# Patient Record
Sex: Male | Born: 1940 | Race: White | Hispanic: No | Marital: Married | Smoking: Former smoker
Health system: Southern US, Community
[De-identification: ages and names within clinical notes are randomized; demographics above are authoritative.]

## PROBLEM LIST (undated history)

## (undated) DIAGNOSIS — H3410 Central retinal artery occlusion, unspecified eye: Secondary | ICD-10-CM

## (undated) DIAGNOSIS — N400 Enlarged prostate without lower urinary tract symptoms: Secondary | ICD-10-CM

## (undated) DIAGNOSIS — I2510 Atherosclerotic heart disease of native coronary artery without angina pectoris: Secondary | ICD-10-CM

## (undated) DIAGNOSIS — I10 Essential (primary) hypertension: Secondary | ICD-10-CM

## (undated) DIAGNOSIS — Z923 Personal history of irradiation: Secondary | ICD-10-CM

## (undated) DIAGNOSIS — G4733 Obstructive sleep apnea (adult) (pediatric): Secondary | ICD-10-CM

## (undated) DIAGNOSIS — Z87442 Personal history of urinary calculi: Secondary | ICD-10-CM

## (undated) DIAGNOSIS — J302 Other seasonal allergic rhinitis: Secondary | ICD-10-CM

## (undated) DIAGNOSIS — K5790 Diverticulosis of intestine, part unspecified, without perforation or abscess without bleeding: Secondary | ICD-10-CM

## (undated) DIAGNOSIS — M1990 Unspecified osteoarthritis, unspecified site: Secondary | ICD-10-CM

## (undated) DIAGNOSIS — E119 Type 2 diabetes mellitus without complications: Secondary | ICD-10-CM

## (undated) DIAGNOSIS — C61 Malignant neoplasm of prostate: Secondary | ICD-10-CM

## (undated) DIAGNOSIS — Z973 Presence of spectacles and contact lenses: Secondary | ICD-10-CM

## (undated) DIAGNOSIS — E785 Hyperlipidemia, unspecified: Secondary | ICD-10-CM

## (undated) DIAGNOSIS — Z9989 Dependence on other enabling machines and devices: Secondary | ICD-10-CM

## (undated) DIAGNOSIS — C4490 Unspecified malignant neoplasm of skin, unspecified: Secondary | ICD-10-CM

## (undated) HISTORY — DX: Obstructive sleep apnea (adult) (pediatric): Z99.89

## (undated) HISTORY — DX: Obstructive sleep apnea (adult) (pediatric): G47.33

## (undated) HISTORY — PX: CARDIAC CATHETERIZATION: SHX172

## (undated) HISTORY — DX: Benign prostatic hyperplasia without lower urinary tract symptoms: N40.0

## (undated) HISTORY — PX: COLONOSCOPY W/ BIOPSIES AND POLYPECTOMY: SHX1376

## (undated) HISTORY — PX: PROSTATE BIOPSY: SHX241

## (undated) HISTORY — PX: TONSILLECTOMY: SUR1361

## (undated) HISTORY — DX: Central retinal artery occlusion, unspecified eye: H34.10

---

## 1961-05-22 HISTORY — PX: HYDROCELE EXCISION: SHX482

## 1999-06-29 ENCOUNTER — Ambulatory Visit (HOSPITAL_COMMUNITY): Admission: RE | Admit: 1999-06-29 | Discharge: 1999-06-29 | Payer: Self-pay | Admitting: *Deleted

## 2001-08-01 ENCOUNTER — Encounter: Payer: Self-pay | Admitting: Emergency Medicine

## 2001-08-01 ENCOUNTER — Emergency Department (HOSPITAL_COMMUNITY): Admission: AC | Admit: 2001-08-01 | Discharge: 2001-08-01 | Payer: Self-pay | Admitting: Emergency Medicine

## 2004-01-05 ENCOUNTER — Encounter: Admission: RE | Admit: 2004-01-05 | Discharge: 2004-01-05 | Payer: Self-pay | Admitting: Internal Medicine

## 2007-05-23 HISTORY — PX: BACK SURGERY: SHX140

## 2007-08-28 ENCOUNTER — Inpatient Hospital Stay (HOSPITAL_COMMUNITY): Admission: RE | Admit: 2007-08-28 | Discharge: 2007-08-30 | Payer: Self-pay | Admitting: Neurological Surgery

## 2007-09-30 ENCOUNTER — Encounter: Admission: RE | Admit: 2007-09-30 | Discharge: 2007-09-30 | Payer: Self-pay | Admitting: Neurological Surgery

## 2007-11-26 ENCOUNTER — Encounter: Admission: RE | Admit: 2007-11-26 | Discharge: 2007-11-26 | Payer: Self-pay | Admitting: Neurological Surgery

## 2008-02-24 ENCOUNTER — Encounter: Admission: RE | Admit: 2008-02-24 | Discharge: 2008-02-24 | Payer: Self-pay | Admitting: Neurological Surgery

## 2008-08-21 ENCOUNTER — Emergency Department (HOSPITAL_COMMUNITY): Admission: EM | Admit: 2008-08-21 | Discharge: 2008-08-21 | Payer: Self-pay | Admitting: Emergency Medicine

## 2008-08-25 ENCOUNTER — Inpatient Hospital Stay (HOSPITAL_COMMUNITY): Admission: EM | Admit: 2008-08-25 | Discharge: 2008-08-28 | Payer: Self-pay | Admitting: Emergency Medicine

## 2010-08-31 LAB — DIFFERENTIAL
Basophils Absolute: 0 10*3/uL (ref 0.0–0.1)
Basophils Absolute: 0 10*3/uL (ref 0.0–0.1)
Basophils Relative: 0 % (ref 0–1)
Basophils Relative: 0 % (ref 0–1)
Eosinophils Absolute: 0 10*3/uL (ref 0.0–0.7)
Eosinophils Absolute: 0 10*3/uL (ref 0.0–0.7)
Eosinophils Relative: 0 % (ref 0–5)
Eosinophils Relative: 0 % (ref 0–5)
Lymphocytes Relative: 10 % — ABNORMAL LOW (ref 12–46)
Lymphocytes Relative: 7 % — ABNORMAL LOW (ref 12–46)
Lymphs Abs: 0.6 10*3/uL — ABNORMAL LOW (ref 0.7–4.0)
Lymphs Abs: 0.8 10*3/uL (ref 0.7–4.0)
Monocytes Absolute: 0.6 10*3/uL (ref 0.1–1.0)
Monocytes Absolute: 0.8 10*3/uL (ref 0.1–1.0)
Monocytes Relative: 10 % (ref 3–12)
Monocytes Relative: 7 % (ref 3–12)
Neutro Abs: 6.5 10*3/uL (ref 1.7–7.7)
Neutro Abs: 7.6 10*3/uL (ref 1.7–7.7)
Neutrophils Relative %: 80 % — ABNORMAL HIGH (ref 43–77)
Neutrophils Relative %: 86 % — ABNORMAL HIGH (ref 43–77)

## 2010-08-31 LAB — HEPATIC FUNCTION PANEL
ALT: 16 U/L (ref 0–53)
ALT: 26 U/L (ref 0–53)
AST: 28 U/L (ref 0–37)
AST: 43 U/L — ABNORMAL HIGH (ref 0–37)
Albumin: 3.1 g/dL — ABNORMAL LOW (ref 3.5–5.2)
Albumin: 3.8 g/dL (ref 3.5–5.2)
Alkaline Phosphatase: 39 U/L (ref 39–117)
Alkaline Phosphatase: 59 U/L (ref 39–117)
Bilirubin, Direct: 0.2 mg/dL (ref 0.0–0.3)
Bilirubin, Direct: 0.3 mg/dL (ref 0.0–0.3)
Indirect Bilirubin: 0.9 mg/dL (ref 0.3–0.9)
Indirect Bilirubin: 1.2 mg/dL — ABNORMAL HIGH (ref 0.3–0.9)
Total Bilirubin: 1.1 mg/dL (ref 0.3–1.2)
Total Bilirubin: 1.5 mg/dL — ABNORMAL HIGH (ref 0.3–1.2)
Total Protein: 6.1 g/dL (ref 6.0–8.3)
Total Protein: 7.2 g/dL (ref 6.0–8.3)

## 2010-08-31 LAB — POCT I-STAT, CHEM 8
BUN: 25 mg/dL — ABNORMAL HIGH (ref 6–23)
BUN: 36 mg/dL — ABNORMAL HIGH (ref 6–23)
Calcium, Ion: 0.98 mmol/L — ABNORMAL LOW (ref 1.12–1.32)
Calcium, Ion: 1.04 mmol/L — ABNORMAL LOW (ref 1.12–1.32)
Chloride: 100 mEq/L (ref 96–112)
Chloride: 100 mEq/L (ref 96–112)
Creatinine, Ser: 1.4 mg/dL (ref 0.4–1.5)
Creatinine, Ser: 1.5 mg/dL (ref 0.4–1.5)
Glucose, Bld: 124 mg/dL — ABNORMAL HIGH (ref 70–99)
Glucose, Bld: 137 mg/dL — ABNORMAL HIGH (ref 70–99)
HCT: 49 % (ref 39.0–52.0)
HCT: 59 % — ABNORMAL HIGH (ref 39.0–52.0)
Hemoglobin: 16.7 g/dL (ref 13.0–17.0)
Hemoglobin: 20.1 g/dL — ABNORMAL HIGH (ref 13.0–17.0)
Potassium: 3 mEq/L — ABNORMAL LOW (ref 3.5–5.1)
Potassium: 3.3 mEq/L — ABNORMAL LOW (ref 3.5–5.1)
Sodium: 139 mEq/L (ref 135–145)
Sodium: 140 mEq/L (ref 135–145)
TCO2: 30 mmol/L (ref 0–100)
TCO2: 33 mmol/L (ref 0–100)

## 2010-08-31 LAB — BASIC METABOLIC PANEL
BUN: 12 mg/dL (ref 6–23)
BUN: 7 mg/dL (ref 6–23)
BUN: 7 mg/dL (ref 6–23)
CO2: 32 mEq/L (ref 19–32)
CO2: 32 mEq/L (ref 19–32)
CO2: 34 mEq/L — ABNORMAL HIGH (ref 19–32)
Calcium: 8.4 mg/dL (ref 8.4–10.5)
Calcium: 8.7 mg/dL (ref 8.4–10.5)
Calcium: 8.8 mg/dL (ref 8.4–10.5)
Chloride: 98 mEq/L (ref 96–112)
Chloride: 98 mEq/L (ref 96–112)
Chloride: 99 mEq/L (ref 96–112)
Creatinine, Ser: 0.89 mg/dL (ref 0.4–1.5)
Creatinine, Ser: 0.98 mg/dL (ref 0.4–1.5)
Creatinine, Ser: 1.09 mg/dL (ref 0.4–1.5)
GFR calc Af Amer: >60 mL/min (ref >60)
GFR calc Af Amer: >60 mL/min (ref >60)
GFR calc Af Amer: >60 mL/min (ref >60)
GFR calc non Af Amer: >60 mL/min (ref >60)
GFR calc non Af Amer: >60 mL/min (ref >60)
GFR calc non Af Amer: >60 mL/min (ref >60)
Glucose, Bld: 104 mg/dL — ABNORMAL HIGH (ref 70–99)
Glucose, Bld: 113 mg/dL — ABNORMAL HIGH (ref 70–99)
Glucose, Bld: 122 mg/dL — ABNORMAL HIGH (ref 70–99)
Potassium: 2.9 mEq/L — ABNORMAL LOW (ref 3.5–5.1)
Potassium: 3.2 mEq/L — ABNORMAL LOW (ref 3.5–5.1)
Potassium: 3.4 mEq/L — ABNORMAL LOW (ref 3.5–5.1)
Sodium: 137 mEq/L (ref 135–145)
Sodium: 138 mEq/L (ref 135–145)
Sodium: 140 mEq/L (ref 135–145)

## 2010-08-31 LAB — CBC
HCT: 43.3 % (ref 39.0–52.0)
HCT: 47.9 % (ref 39.0–52.0)
HCT: 55.3 % — ABNORMAL HIGH (ref 39.0–52.0)
Hemoglobin: 15.2 g/dL (ref 13.0–17.0)
Hemoglobin: 16.7 g/dL (ref 13.0–17.0)
Hemoglobin: 19 g/dL — ABNORMAL HIGH (ref 13.0–17.0)
MCHC: 34.4 g/dL (ref 30.0–36.0)
MCHC: 34.8 g/dL (ref 30.0–36.0)
MCHC: 35 g/dL (ref 30.0–36.0)
MCV: 91.8 fL (ref 78.0–100.0)
MCV: 92.9 fL (ref 78.0–100.0)
MCV: 93 fL (ref 78.0–100.0)
Platelets: 150 10*3/uL (ref 150–400)
Platelets: 162 10*3/uL (ref 150–400)
Platelets: 169 10*3/uL (ref 150–400)
RBC: 4.66 MIL/uL (ref 4.22–5.81)
RBC: 5.22 MIL/uL (ref 4.22–5.81)
RBC: 5.95 MIL/uL — ABNORMAL HIGH (ref 4.22–5.81)
RDW: 12.8 % (ref 11.5–15.5)
RDW: 12.9 % (ref 11.5–15.5)
RDW: 13.2 % (ref 11.5–15.5)
WBC: 6.6 10*3/uL (ref 4.0–10.5)
WBC: 8.2 10*3/uL (ref 4.0–10.5)
WBC: 8.9 10*3/uL (ref 4.0–10.5)

## 2010-08-31 LAB — GLUCOSE, CAPILLARY
Glucose-Capillary: 101 mg/dL — ABNORMAL HIGH (ref 70–99)
Glucose-Capillary: 102 mg/dL — ABNORMAL HIGH (ref 70–99)
Glucose-Capillary: 109 mg/dL — ABNORMAL HIGH (ref 70–99)
Glucose-Capillary: 111 mg/dL — ABNORMAL HIGH (ref 70–99)
Glucose-Capillary: 115 mg/dL — ABNORMAL HIGH (ref 70–99)
Glucose-Capillary: 115 mg/dL — ABNORMAL HIGH (ref 70–99)
Glucose-Capillary: 121 mg/dL — ABNORMAL HIGH (ref 70–99)
Glucose-Capillary: 123 mg/dL — ABNORMAL HIGH (ref 70–99)
Glucose-Capillary: 123 mg/dL — ABNORMAL HIGH (ref 70–99)
Glucose-Capillary: 128 mg/dL — ABNORMAL HIGH (ref 70–99)
Glucose-Capillary: 132 mg/dL — ABNORMAL HIGH (ref 70–99)
Glucose-Capillary: 132 mg/dL — ABNORMAL HIGH (ref 70–99)
Glucose-Capillary: 136 mg/dL — ABNORMAL HIGH (ref 70–99)
Glucose-Capillary: 141 mg/dL — ABNORMAL HIGH (ref 70–99)
Glucose-Capillary: 142 mg/dL — ABNORMAL HIGH (ref 70–99)
Glucose-Capillary: 144 mg/dL — ABNORMAL HIGH (ref 70–99)
Glucose-Capillary: 96 mg/dL (ref 70–99)
Glucose-Capillary: 96 mg/dL (ref 70–99)

## 2010-08-31 LAB — URINALYSIS, ROUTINE W REFLEX MICROSCOPIC
Glucose, UA: NEGATIVE mg/dL
Hgb urine dipstick: NEGATIVE
Ketones, ur: 40 mg/dL — AB
Leukocytes, UA: NEGATIVE
Nitrite: NEGATIVE
Protein, ur: 30 mg/dL — AB
Specific Gravity, Urine: 1.026 (ref 1.005–1.030)
Urobilinogen, UA: 1 mg/dL (ref 0.0–1.0)
pH: 7 (ref 5.0–8.0)

## 2010-08-31 LAB — LIPASE, BLOOD
Lipase: 18 U/L (ref 11–59)
Lipase: 30 U/L (ref 11–59)

## 2010-08-31 LAB — POCT URINALYSIS DIP (DEVICE)
Glucose, UA: NEGATIVE mg/dL
Hgb urine dipstick: NEGATIVE
Nitrite: NEGATIVE
Protein, ur: 100 mg/dL — AB
Specific Gravity, Urine: >=1.03 (ref 1.005–1.030)
Urobilinogen, UA: 0.2 mg/dL (ref 0.0–1.0)
pH: 5.5 (ref 5.0–8.0)

## 2010-08-31 LAB — LACTIC ACID, PLASMA: Lactic Acid, Venous: 0.8 mmol/L (ref 0.5–2.2)

## 2010-08-31 LAB — URINE MICROSCOPIC-ADD ON

## 2010-08-31 LAB — HEMOGLOBIN A1C
Hgb A1c MFr Bld: 5.8 % (ref 4.6–6.1)
Mean Plasma Glucose: 120 mg/dL

## 2010-10-04 NOTE — H&P (Signed)
NAMEASPYN, SCHRAG NO.:  [id number]   MEDICAL RECORD NO.:  [id number]          PATIENT TYPE:  INP   LOCATION:  5155                         FACILITY:  [facility]   PHYSICIAN:  Orson Ape. Weatherly, M.D.DATE OF BIRTH:  1941-01-09   DATE OF ADMISSION:  08/25/2008  DATE OF DISCHARGE:                              HISTORY & PHYSICAL   CHIEF COMPLAINTS:  Nausea and vomiting.   PRIMARY CARE PHYSICIAN:  Mark A. Perini, MD   HISTORY OF PRESENT ILLNESS:  The patient is a 70 year old male with a  history of diet-controlled type 2 diabetes, diverticulitis,  hypertension, and hyperlipidemia.  He presents with a 1-week history of  fatigue, nausea, vomiting, and diarrhea.  The illness started 7 days ago  and began with severe fatigue, then approximately 6 days ago, he  developed nausea, vomiting, and diarrhea.  The diarrhea subsided few  days later, but he can continued to have nausea and vomiting.  Also, had  a low-grade fever of up to 100.8 and abdominal distention.  He was seen  about 3 days ago at the [facility] and then sent over to the  emergency room, diagnosed with a viral illness and dehydration.  He was  given IV fluids in the emergency room and then sent home, but he came to  the ER again last night when his vomiting continued to persist.   REVIEW OF SYSTEMS:  Significant for last bowel movement 3 days ago and  he has had flatulence throughout this illness has been able to pass much  flatulence throughout this illness.   PAST MEDICAL HISTORY:  Diverticulitis.  His last flare was number of  years ago.  He also has hypertension, hyperlipidemia, and type 2  diabetes, which is diet controlled.   MEDICATIONS:  Lipitor; indapamide, this is a thiazide diuretic; and a  multivitamin.   PAST SURGICAL HISTORY:  1. L4-L5 laminectomy in April 2005, with fusion.  2. Hydrocele, which was repaired in 1962.   He has had no abdominal surgeries in the past.   SOCIAL  HISTORY:  He has a remote smoking history.  No alcohol.  Works in  Insurance underwriter.  Lives with his wife.   FAMILY HISTORY:  Noncontributory.   PHYSICAL EXAMINATION:  GENERAL:  The patient is in no acute distress.  He has just had an NG tube placed, sitting up in bed.  HEENT:  He is normocephalic and atraumatic.  His oropharynx is clear.  NG tube is in place.  At the time of exam, it is not draining any fluid.  NECK:  There is no lymphadenopathy.  CARDIOVASCULAR:  Regular rate and rhythm.  No murmurs.  PULMONARY:  Clear to auscultation bilaterally.  SKIN:  Normal.  There are no scars.  ABDOMEN:  Distended.  His abdomen is soft, it is not tender.  He does  have hyperactive bowel sounds in all quadrants except for the right  lower quadrant where he has normoactive bowel sounds.  There are some  high-pitched bowel sounds in the upper quadrants.  EXTREMITIES:  He has no edema.  He has  2+ DP pulses.   LABORATORIES AND STUDIES:  Sodium 139, potassium was low at 3.0,  chloride 100, BUN 25, creatinine 1.4, and glucose 124.  UA showed no  signs of infection.  Normal specific gravity.  White blood cells 8.2  with 80% neutrophils, hemoglobin 16.7, hematocrit 47.9, and platelets  169.  Lactic acid 0.8.  Albumin was slightly low at 3.1, but otherwise  his LFTs were within normal limits.  His lipase is normal at 30.  CT  abdomen and pelvis showed moderate to high-grade small bowel  obstruction.  Questions, transition point in the small bowel, which may  be the point of obstruction.  This is in the mid ileum, could be benign  or malignant stricture.  Also, he had sigmoid diverticulosis.  Chest x-  ray showed bibasilar atelectasis, no free air, also diffuse gaseous  small bowel dilation suspicious for SBO, which was able to be seen in  the chest x-ray.   ASSESSMENT AND PLAN:  A 70 year old male with small bowel obstruction.  1. Small bowel obstruction without a history of prior abdominal      surgery.   Viral illness is the most likely precipitate.  Other      possibilities include a malignancy or other mass causing a      stricture or small bowel diverticuli.   We will continue NG tube to suction, place the patient n.p.o., IV fluids  with potassium, and encourage ambulation, and abdominal films in the  morning.  After the small bowel obstruction resolves, we probably need  further imaging to evaluate the stricture seen on CT.  1. Diabetes.  The patient is diet controlled at home.  We will check      an hemoglobin A1c.  Place him on sensitive sliding scale insulin.  2. Hyperlipidemia.  We will hold his Lipitor for now while he is      n.p.o.  3. Hypertension.  We will hold his diuretic for now while he is n.p.o.      We will monitor his blood pressures regularly and consider IV      antihypertensives if his pressures are not controlled.  4. Prophylaxis.  We will give Lovenox sequential compression devices.  5. Hypokalemia.  We will replete his potassium.      Carin Hock, MD  Electronically Signed      Orson Ape. Rise Patience, M.D.  Electronically Signed    SO/MEDQ  D:  08/25/2008  T:  08/26/2008  Job:  [id number]   cc:   Elta Guadeloupe A. Perini, M.D.

## 2010-10-04 NOTE — Discharge Summary (Signed)
NAMEKEDUS, DROUHARD NO.:  [id number]   MEDICAL RECORD NO.:  [id number]          PATIENT TYPE:  INP   LOCATION:  5155                         FACILITY:  [facility]   PHYSICIAN:  Orson Ape. Weatherly, M.D.DATE OF BIRTH:  [birth year]   DATE OF ADMISSION:  08/25/2008  DATE OF DISCHARGE:  08/28/2008                               DISCHARGE SUMMARY   REASON FOR HOSPITALIZATION:  Small bowel obstruction.   DISCHARGE DIAGNOSIS:  Small bowel obstruction.   ADDITIONAL DIAGNOSES:  1. Hypertension.  2. Hyperlipidemia.  3. Diverticulosis.  4. Diet-controlled type 2 diabetes.   DISCHARGE MEDICATIONS:  Lipitor continued previous dose, indapamide,  multivitamin, MiraLax 17 grams dissolved in 8 ounces of liquid p.o.  daily.   BRIEF HISTORY:  The patient is a 70 year old with no history of  abdominal surgeries who was admitted for several days of nausea and  vomiting, abdominal distention, found to have a small bowel obstruction.   HOSPITAL COURSE BY PROBLEM:  1. Small bowel obstruction.  CT showed moderate to high-grade small      bowel obstruction.  There was noted a possible transition point in      the small bowel which could be this point of obstruction in the mid      ileum.  This may be a stricture or some other mass.  Also noted on      CT with sigmoid diverticulosis.  During his hospitalization, the      patient was initially placed n.p.o.  An NG tube was placed and put      on suction.  The patient was encouraged to ambulate.  He was given      one dose of milk of magnesia.  As his small bowel obstruction      improved, his diet was advanced to clear liquid.  The patient's      condition did improve.  X-rays were taken following oral contrast      several hours apart and showed progression of the contrast through      the small and large bowel and this continued to improve.  He is      being sent home on a full liquid diet and will follow up with Dr.      Rise Patience  on August 31, 2008.  He is also being sent home on daily      MiraLax.  2. In terms of his other problems, he was placed on sensitive sliding-      scale insulin.  The hemoglobin A1c was measured which turned out to      be 5.8.  3. For his hyperlipidemia, we did not continue him on his Lipitor.  He      was n.p.o.  4. For his hypertension, we held his diuretics.  His blood pressures      remained within a reasonable range.   FOLLOWUP ISSUES:  Followup of the patient's small bowel obstruction and  perhaps further imaging as needed to better describe the lesion in his  small intestine seen on the initial CT and advancing his diet as  tolerated.  FOLLOWUP APPOINTMENTS:  As mentioned previously, the patient is to  follow up with Dr. Rise Patience on August 31, 2008 at 10:30 and to follow up  with his primary care doctor, Dr. Joylene Draft as needed.      Carin Hock, MD  Electronically Signed      Orson Ape. Rise Patience, M.D.  Electronically Signed    SO/MEDQ  D:  08/28/2008  T:  08/28/2008  Job:  [id number]   cc:   Elta Guadeloupe A. Perini, M.D.

## 2010-10-04 NOTE — Op Note (Signed)
Jeremiah [name], Jeremiah [name] NO.:  [id number]   MEDICAL RECORD NO.:  [id number]          PATIENT TYPE:  INP   LOCATION:  2899                         FACILITY:  [facility]   PHYSICIAN:  [name], MD     DATE OF BIRTH:  19-Oct-1940   DATE OF PROCEDURE:  08/28/2007  DATE OF DISCHARGE:                               OPERATIVE REPORT   PREOPERATIVE DIAGNOSIS:  1. Spondylolisthesis, L4-L5.  2. Spinal stenosis, L4-L5.  3. Facet arthropathy, L4-L5.  4. Back pain.  5. Left leg pain.   POSTOPERATIVE DIAGNOSIS:  1. Spondylolisthesis, L4-L5.  2. Spinal stenosis, L4-L5.  3. Facet arthropathy, L4-L5.  4. Back pain.  5. Left leg pain.   PROCEDURE:  1. Decompressive lumbar laminectomy, hemifacetectomy and foraminotomy,      L4-L5 for decompression of the L4 and the L5 nerve roots requiring      more work than is required with a typical PLIF procedure,      considerable time was spent undercutting the lamina of L3 to      decompress the L4 nerve roots up to the disc space of L3-L4 such      that I actually decompressed L3-L4 also as a level separate from      the decompression required of the PLIF procedure at L4-L5.  2. Posterior lumbar interbody fusion L4-L5 utilizing 12 x 22 mm PEEK      interbody cage packed with local autograft Actifuse putty and a 12      x 22 mm Tangent interbody bone wedge, the midline was packed with      local autograft and Actifuse.  3. Intertransverse arthrodesis L4-L5 utilizing locally harvested      morselized autologous bone graft and Actifuse putty.  4. Nonsegmental fixation L4-L5 utilizing the Legacy pedicle screw      system.   SURGEON:  [name], M.D.   ASSISTANT:  Kary Kos, M.D.   ANESTHESIA:  General endotracheal anesthesia.   COMPLICATIONS:  None apparent.   INDICATIONS FOR PROCEDURE:  [name] is a very pleasant 70 year old  gentleman who presented with a degenerative spondylolisthesis at L4-L5  with severe facet  arthropathy and spinal stenosis.  He had back and left  leg pain.  He tried medical management including epidural steroid  injections for quite sometime without significant relief.  His pain  became unrelenting and quite severe, affecting his quality of life.  I  recommended decompression and instrumented fusion to address both the  segmental instability and spinal stenosis.  He understood the risks,  benefits, expected outcome, and wished to proceed.   DESCRIPTION OF PROCEDURE:  The patient was taken to the operating room  and after induction of adequate generalized endotracheal anesthesia, he  was rolled in the prone position on chest rolls and all pressure points  were padded.  His lumbar region was prepped with DuraPrep and draped in  the usual sterile fashion.  10 mL of local anesthesia was injected and a  dorsal midline incision was made and carried down to the lumbosacral  fascia.  The fascia was  opened and the paraspinous musculature was taken  down in a subperiosteal fashion to expose L3-L4 and L4-L5.  Intraoperative fluoroscopy confirmed my level.  I then used the Leksell  rongeur to remove the spinous process and begin the laminectomy process  at L4-L5. The laminectomy, hemifacetectomy and foraminotomies were  completed with the 3 and 4 mm Kerrison punch. As the underlying yellow  ligament was opened and removed, it was quite compressive over the L4  and L5 nerve roots.  The L4 nerve root seemed to be more compressed than  the L5 nerve roots.  I spent considerable time undercutting the lamina  of L3, removing yellow ligament to decompress the L3-L4 level to help  prevent adjacent level problems.  I also followed this along the L4  nerve roots to decompress these distally into their respective foramina.  Once the decompression was completed at the L4 and L5 nerve roots, I  turned my attention to the PLIF procedure.  We incised the disc space  and used sequential distraction and  distracted the disc space up to 12  mm.  We then used the rotating cutter and 12 mm cutting chisel to  prepare the endplates.  We prepared the midline with the Epstein  curette.  We then used a 12 x 22 mm PEEK interbody cage on the patient's  left side and a 12 x 22 mm Tangent interbody bone wedge on the patient's  right side and the midline was packed with local autograft and Actifuse  putty.  We then localized the pedicle screw entry zones utilizing  surface landmarks with lateral fluoroscopy.  We probed each pedicle with  a pedicle probe, tapped each pedicle with a 5.5 tap, probed each pedicle  with a ball probe to assure no break in the cortex, and placed 6.5 x 45  mm pedicle screws into the pedicles of L4 on the left and L5  bilaterally.  I used a 6.5 by 50 mm pedicle screw in the pedicle of L4  on the right because of the overgrown facet above.  We then decorticated  the transverse processes and placed a mixture of local autograft and  Actifuse out over these to perform intertransverse arthrodesis  bilaterally.  We then placed 30 mm lordotic rods into the multiaxial  screw heads of the pedicle screws and locked these into position after  achieving compression of the grafts.  We then irrigated with saline  solution containing bacitracin, checked our construct using AP and  lateral fluoroscopy, placed a medium Hemovac drain through a separate  stab incision after palpating along the nerve roots with a coronary  dilator to assure adequate decompression, the dura was lined with  Gelfoam.  We then closed the muscle and the fascia with 0 Vicryl, closed  the subcutaneous and subcuticular tissue with 2-0 and 3-0 Vicryl, and  closed the skin with Benzoin and Steri-Strips.  The drapes were removed,  a sterile dressing was applied.  The patient was awakened from general  anesthesia and transferred to the recovery room in stable condition.  At  the end of the procedure, all sponge, needle and  instrument counts were  correct.      [name], MD  Electronically Signed     DSJ/MEDQ  D:  08/28/2007  T:  08/28/2007  Job:  [id number]

## 2011-02-14 LAB — BASIC METABOLIC PANEL
BUN: 27 — ABNORMAL HIGH
CO2: 30
Calcium: 10.3
Chloride: 99
Creatinine, Ser: 0.92
GFR calc Af Amer: >60
GFR calc non Af Amer: >60
Glucose, Bld: 95
Potassium: 4.4
Sodium: 140

## 2011-02-14 LAB — CBC
HCT: 49.6
Hemoglobin: 17.1 — ABNORMAL HIGH
MCHC: 34.5
MCV: 92.1
Platelets: 188
RBC: 5.38
RDW: 12.5
WBC: 6.3

## 2011-02-14 LAB — DIFFERENTIAL
Basophils Absolute: 0
Basophils Relative: 1
Eosinophils Absolute: 0.1
Eosinophils Relative: 2
Lymphocytes Relative: 22
Lymphs Abs: 1.4
Monocytes Absolute: 0.6
Monocytes Relative: 9
Neutro Abs: 4.2
Neutrophils Relative %: 66

## 2011-02-14 LAB — TYPE AND SCREEN
ABO/RH(D): A POS
Antibody Screen: NEGATIVE

## 2011-02-14 LAB — ABO/RH: ABO/RH(D): A POS

## 2011-02-14 LAB — APTT: aPTT: 25

## 2011-02-14 LAB — PROTIME-INR
INR: 0.9
Prothrombin Time: 12.3

## 2012-05-24 ENCOUNTER — Ambulatory Visit (INDEPENDENT_AMBULATORY_CARE_PROVIDER_SITE_OTHER): Payer: Medicare Other | Admitting: *Deleted

## 2012-05-24 DIAGNOSIS — H3410 Central retinal artery occlusion, unspecified eye: Secondary | ICD-10-CM

## 2012-06-14 ENCOUNTER — Other Ambulatory Visit: Payer: Self-pay | Admitting: Internal Medicine

## 2012-06-14 DIAGNOSIS — H3410 Central retinal artery occlusion, unspecified eye: Secondary | ICD-10-CM

## 2012-06-16 ENCOUNTER — Ambulatory Visit
Admission: RE | Admit: 2012-06-16 | Discharge: 2012-06-16 | Disposition: A | Discharge status: Home / Self Care | Payer: Medicare Other | Source: Ambulatory Visit | Attending: Internal Medicine | Admitting: Internal Medicine

## 2012-06-16 DIAGNOSIS — H3410 Central retinal artery occlusion, unspecified eye: Secondary | ICD-10-CM

## 2013-05-25 ENCOUNTER — Encounter: Payer: Self-pay | Admitting: Radiation Oncology

## 2014-01-20 DIAGNOSIS — C61 Malignant neoplasm of prostate: Secondary | ICD-10-CM

## 2014-01-20 HISTORY — DX: Malignant neoplasm of prostate: C61

## 2014-03-05 ENCOUNTER — Encounter: Payer: Self-pay | Admitting: Radiation Oncology

## 2014-03-09 ENCOUNTER — Encounter: Payer: Self-pay | Admitting: Radiation Oncology

## 2014-03-09 NOTE — Progress Notes (Addendum)
GU Location of Tumor / Histology: prostate adenocarcinoma  If Prostate Cancer, Gleason Score is (4 +3 ) and PSA is (9.72 on 12/09/13) 06/13/13 PSA 8.33 12/16/12 PSA 7.63  Jeremiah [name] presented 2 1/2 years ago with signs/symptoms of: rising PSA  Biopsies of  Prostate (if applicable) revealed:  [id number]   Past/Anticipated interventions by urology, if any: biopsies and surveillance since 2012  Past/Anticipated interventions by medical oncology, if any: no  Weight changes, if any: no  Bowel/Bladder complaints, if any:  IPSS 10, weak stream, nocturia x 2  Nausea/Vomiting, if any: no  Pain issues, if any:  no  SAFETY ISSUES:  Prior radiation? no  Pacemaker/ICD? no  Possible current pregnancy? na  Is the patient on methotrexate? no  Current Complaints / other details:  [employer], retired, married, 2 children Positive prostate biopsies in 2013, 2014, Gleason 6 Dr Risa Grill: Discussion re: external beam radiation; seed implant is potential possibility although prostate size is borderline.

## 2014-03-10 ENCOUNTER — Encounter: Payer: Self-pay | Admitting: Radiation Oncology

## 2014-03-10 ENCOUNTER — Ambulatory Visit
Admission: RE | Admit: 2014-03-10 | Discharge: 2014-03-10 | Disposition: A | Discharge status: Home / Self Care | Payer: Medicare Other | Source: Ambulatory Visit | Attending: Radiation Oncology | Admitting: Radiation Oncology

## 2014-03-10 VITALS — BP 126/75 | HR 66 | Temp 98.6°F | Resp 20 | Ht 69.0 in | Wt 187.1 lb

## 2014-03-10 DIAGNOSIS — C61 Malignant neoplasm of prostate: Secondary | ICD-10-CM | POA: Insufficient documentation

## 2014-03-10 DIAGNOSIS — Z510 Encounter for antineoplastic radiation therapy: Secondary | ICD-10-CM | POA: Diagnosis not present

## 2014-03-10 HISTORY — DX: Hyperlipidemia, unspecified: E78.5

## 2014-03-10 HISTORY — DX: Unspecified malignant neoplasm of skin, unspecified: C44.90

## 2014-03-10 HISTORY — DX: Essential (primary) hypertension: I10

## 2014-03-10 HISTORY — DX: Unspecified osteoarthritis, unspecified site: M19.90

## 2014-03-10 HISTORY — DX: Type 2 diabetes mellitus without complications: E11.9

## 2014-03-10 HISTORY — DX: Diverticulosis of intestine, part unspecified, without perforation or abscess without bleeding: K57.90

## 2014-03-10 HISTORY — DX: Malignant neoplasm of prostate: C61

## 2014-03-10 NOTE — Progress Notes (Signed)
Please see the Nurse Progress Note in the MD Initial Consult Encounter for this patient. 

## 2014-03-10 NOTE — Progress Notes (Signed)
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 male who is seen today through the courtesy of Dr. Risa Grill for discussion of possible radiation therapy in the management of his stage T1c intermediate risk adenocarcinoma prostate. He initially presented with an elevated PSA of 6.5 and was referred to Dr. Risa Grill. A repeat PSA at [facility] was 5.8. He underwent ultrasound-guided biopsies on June 08, 2011  and was found to have low volume Gleason 6 in 2 of 12 biopsies. He appropriately elected active surveillance. His PSA rose to 7.0 by October 2013 and underwent repeat biopsies on 12/05/2011 revealing again low volume Gleason 6 in  3 of 12 biopsies. His most recent PSA rose to 9.72 by 12/09/2013. He underwent ultrasound-guided biopsies on 02/09/2014 and was found have Gleason 7 (4+3) involving 5% of one core from the left lateral mid gland, pattern 4 equals 70%. He also had Gleason 7 (3+4) involving 5% of one core from left lateral apex and 40% of one core from the left apex, pattern 4 equals 30%. His gland volume was 53 cc. He is doing reasonably well from a GU and GI standpoint. His I PSS score is 10 while on tamsulosin. He does have erectile dysfunction and is not sexually active.  PREVIOUS RADIATION THERAPY: No   PAST MEDICAL HISTORY:  has a past medical history of Prostate cancer (01/2014); Diverticulosis; Diabetes mellitus without complication; Hyperlipidemia; Hypertension; Osteoarthritis; and Skin cancer.     PAST SURGICAL HISTORY:  Past Surgical History  Procedure Laterality Date  . Prostate biopsy  2013, 2014, 01/2014    Gleason 7  . Back  surgery  2009  . Tonsillectomy    . Hydrocele excision  1963     FAMILY HISTORY: family history includes Cancer in his father and paternal uncle; Heart attack in his father. Is father died of a heart attack at 46, also had a history of prostate cancer. His mother died following a leg fracture at age 48. Two paternal uncles had prostate cancer in their late years.   SOCIAL HISTORY:  reports that he quit smoking about 53 years ago. He does not have any smokeless tobacco history on file. He reports that he does not drink alcohol or use illicit drugs. Married, 2 children, retired Charity fundraiser.   ALLERGIES: Review of patient's allergies indicates no known allergies.   MEDICATIONS:  Current Outpatient Prescriptions  Medication Sig Dispense Refill  . aspirin 325 MG EC tablet Take 325 mg by mouth daily.      [name] Kitchen atorvastatin (LIPITOR) 10 MG tablet Take 10 mg by mouth daily.      . calcium-vitamin D (OSCAL WITH D) 250-125 MG-UNIT per tablet Take 1 tablet by mouth daily.      . indapamide (LOZOL) 2.5 MG tablet Take 2.5 mg by mouth daily.      . potassium chloride (KLOR-CON) 20 MEQ packet Take by mouth 2 (two) times daily.      . tamsulosin (FLOMAX) 0.4 MG CAPS capsule Take 0.4 mg by mouth.      . Vitamin D, Ergocalciferol, (DRISDOL) 50000 UNITS  CAPS capsule Take 50,000 Units by mouth every 7 (seven) days.       No current facility-administered medications for this encounter.     REVIEW OF SYSTEMS:  Pertinent items are noted in HPI.    PHYSICAL EXAM:  height is 5\' 9"  (1.753 m) and weight is 187 lb 1.6 oz (84.868 kg). His oral temperature is 98.6 F (37 C). His blood pressure is 126/75 and his pulse is 66. His respiration is 20.   Alert and oriented 73 year old white male appearing slightly younger than his stated age. Head and neck examination: Grossly unremarkable. Nodes: Without palpable cervical or supraclavicular lymphadenopathy. Chest: Lungs clear. Abdomen: Without masses  organomegaly. Genitalia: Unremarkable to inspection. Rectal: The prostate gland is slightly enlarged and is without focal induration or nodularity. Extremities: Without edema.   LABORATORY DATA:  Lab Results  Component Value Date   WBC 6.6 08/26/2008   HGB 15.2 08/26/2008   HCT 43.3 08/26/2008   MCV 93.0 08/26/2008   PLT 150 08/26/2008   Lab Results  Component Value Date   NA 138 08/28/2008   K 3.4* 08/28/2008   CL 98 08/28/2008   CO2 32 08/28/2008   Lab Results  Component Value Date   ALT 16 08/25/2008   AST 28 08/25/2008   ALKPHOS 39 08/25/2008   BILITOT 1.1 08/25/2008   PSA 9.72 from 12/09/2013   IMPRESSION: StageT1c intermediate risk adenocarcinoma prostate. I explained to the patient and his wife that his prognosis is related to his stage, PSA level, and Gleason score. His stage and PSA level are favorable while his Gleason score of 7 is of intermediate favorability. His PSA does approach criteria for intermediate risk (>10), some of which may be from his slightly enlarged prostate. I would not consider him to be early intermediate risk. We discussed surgery versus continued active surveillance versus radiation therapy. Radiation therapy options include seed implantation with or without 5 weeks of external beam radiation therapy versus 8 weeks of external beam/IMRT. I feel somewhat uncomfortable about treating him with seed implantation alone in view of his Gleason 7 (4+3), and also PSA approaching 10 which is borderline high-risk even though he does have low volume disease.  His prostate volume may be contributing to some elevation of his PSA. If he wants seed implantation then I would prefer 5 weeks of external beam followed by a seed implant boost. His prostate volume is probably acceptable for seed implantation, but we would obviously need to perform a CT arch study to confirm.  We discussed both seed implantation and also external beam/IMRT in great detail. He will think things over and get back in touch  with me if he wishes to proceed with radiation therapy. He knows that we would need to have Dr. Risa Grill placed 3 gold seed markers for image guidance regardless of his choice of therapy.  PLAN:  as discussed above.   I spent 60 minutes minutes face to face with the patient and more than 50% of that time was spent in counseling and/or coordination of care.

## 2014-03-11 ENCOUNTER — Encounter: Payer: Self-pay | Admitting: Radiation Oncology

## 2014-03-11 ENCOUNTER — Telehealth: Payer: Self-pay | Admitting: *Deleted

## 2014-03-11 NOTE — Progress Notes (Signed)
CC: Dr. Rana Snare  Jeremiah [name] left a message that he wants to go with EB/IMRT. I will get him scheduled for placement of gold seeds, then CT sim.

## 2014-03-11 NOTE — Telephone Encounter (Signed)
Received call from patient stating he has decided to have gold seed markers placed and then receive radiation treatment. Informed him Verdell Carmine, Art therapist will call him after this is scheduled with Dr Cy Blamer office. Pt verbalized understanding. Dr Valere Dross notified per in basket so orders can be placed.

## 2014-03-13 ENCOUNTER — Telehealth: Payer: Self-pay | Admitting: *Deleted

## 2014-03-13 NOTE — Telephone Encounter (Signed)
CALLED PATIENT TO INFORM OF GOLD SEED PLACEMENT ON 04-09-14 @ 9:15 AM @ DR. GRAPEY'S OFFICE AND HIS SIM ON 04-13-14 @ 9 AM @ DR. MURRAY'S OFFICE, SPOKE WITH BRENDA Graw AND SHE IS AWARE OF THESE APPTS.

## 2014-04-09 ENCOUNTER — Encounter: Payer: Self-pay | Admitting: Radiation Oncology

## 2014-04-09 NOTE — Progress Notes (Signed)
CC: Dr. Rana Snare  There was miscommunication in Mr. Denno choice of XRT. Dr. Risa Grill clarified that he wants 5 weeks of external beam followed by seed inplant boost instead of 8 weeks of IMRT alone. We will proceed with CT sim as planned for 11-23 and do CT arch study at same time. His gold markers are being placed today.

## 2014-04-13 ENCOUNTER — Ambulatory Visit
Admission: RE | Admit: 2014-04-13 | Discharge: 2014-04-13 | Disposition: A | Discharge status: Home / Self Care | Payer: Medicare Other | Source: Ambulatory Visit | Attending: Radiation Oncology | Admitting: Radiation Oncology

## 2014-04-13 DIAGNOSIS — Z510 Encounter for antineoplastic radiation therapy: Secondary | ICD-10-CM | POA: Diagnosis not present

## 2014-04-13 DIAGNOSIS — C61 Malignant neoplasm of prostate: Secondary | ICD-10-CM

## 2014-04-13 NOTE — Progress Notes (Signed)
CC: Dr. Rana Snare   Complex simulation/treatment planning note: The patient was taken to the CT simulator.  A Vac lock immobilization device was constructed.  A red rubber tube was placed within the rectal vault.  He was then catheterized and contrast instilled into the bladder and urethra.  The CT data set was sent to the planning system where I contoured his prostate.  The prostate volume was 51.7 mL, correlating well with Dr. Risa Grill is measurement of 53 mL.  This volume did not include the proximal seminal vesicles.  The prostate volume was projected over the pubic arch with a 10 angle.  The arch was determined to be open.  Therefore, the patient is a candidate for a prostate seed implant boost after his initial external beam.  The CT data set was sent to the MIM planning system where I again contoured his prostate, seminal vesicles, rectum, and bladder.  The prostate and proximal seminal vesicles were expanded by 0.8 cm except for 0.5 cm along the rectum to create PTV 45 which will receive 4500 cGy in 25 sessions.  He is now ready for 3-D conformal planning.  He will undergo daily image guidance setting up to his 3 gold seeds.  One to 2 weeks following his external beam radiation therapy he will receive a boost dose of 11,000 cGy with I-125.

## 2014-04-14 ENCOUNTER — Encounter: Payer: Self-pay | Admitting: Radiation Oncology

## 2014-04-14 DIAGNOSIS — Z510 Encounter for antineoplastic radiation therapy: Secondary | ICD-10-CM | POA: Diagnosis not present

## 2014-04-14 NOTE — Progress Notes (Signed)
3-D simulation note: The patient underwent 3-D simulation for treatment in the management of his carcinoma the prostate.  He was set up to a 5 field technique including 2 dynamic conformal arcs for a total of 5 complex treatment devices.  Dose volume histograms were obtained for the target structures including the prostate and seminal vesicles in addition to avoidance structures including the bladder, rectum, and femoral heads.  We met  our departmental guidelines.  I'm prescribing 4500 cGy 25 sessions utilizing 15 MV photons.

## 2014-04-22 ENCOUNTER — Ambulatory Visit
Admission: RE | Admit: 2014-04-22 | Discharge: 2014-04-22 | Disposition: A | Discharge status: Home / Self Care | Payer: Medicare Other | Source: Ambulatory Visit | Attending: Radiation Oncology | Admitting: Radiation Oncology

## 2014-04-22 DIAGNOSIS — C61 Malignant neoplasm of prostate: Secondary | ICD-10-CM

## 2014-04-22 DIAGNOSIS — Z510 Encounter for antineoplastic radiation therapy: Secondary | ICD-10-CM | POA: Diagnosis not present

## 2014-04-22 NOTE — Progress Notes (Signed)
Simulation verification note: The patient underwent simulation verification for treatment to his prostate.  His isocenter is in good position and the multileaf collimators contoured the treatment volume appropriately.

## 2014-04-23 ENCOUNTER — Ambulatory Visit: Payer: Medicare Other

## 2014-04-23 ENCOUNTER — Ambulatory Visit
Admission: RE | Admit: 2014-04-23 | Discharge: 2014-04-23 | Disposition: A | Discharge status: Home / Self Care | Payer: Medicare Other | Source: Ambulatory Visit | Attending: Radiation Oncology | Admitting: Radiation Oncology

## 2014-04-23 DIAGNOSIS — Z510 Encounter for antineoplastic radiation therapy: Secondary | ICD-10-CM | POA: Diagnosis not present

## 2014-04-24 ENCOUNTER — Ambulatory Visit
Admission: RE | Admit: 2014-04-24 | Discharge: 2014-04-24 | Disposition: A | Discharge status: Home / Self Care | Payer: Medicare Other | Source: Ambulatory Visit | Attending: Radiation Oncology | Admitting: Radiation Oncology

## 2014-04-24 DIAGNOSIS — C61 Malignant neoplasm of prostate: Secondary | ICD-10-CM

## 2014-04-24 DIAGNOSIS — Z510 Encounter for antineoplastic radiation therapy: Secondary | ICD-10-CM | POA: Diagnosis not present

## 2014-04-24 NOTE — Progress Notes (Signed)
Patient education completed with patient. Gave him "Radiation and You" booklet with all pertinent information marked and discussed, re: fatigue, rectal irritation/management, urinary/bladder irritation/manaement, nutrition, pain. Teach back method used. Patient had no questions. Pt verbalized understanding.

## 2014-04-27 ENCOUNTER — Encounter: Payer: Self-pay | Admitting: Radiation Oncology

## 2014-04-27 ENCOUNTER — Ambulatory Visit
Admission: RE | Admit: 2014-04-27 | Discharge: 2014-04-27 | Disposition: A | Discharge status: Home / Self Care | Payer: Medicare Other | Source: Ambulatory Visit | Attending: Radiation Oncology | Admitting: Radiation Oncology

## 2014-04-27 VITALS — Temp 98.3°F | Resp 18 | Wt 184.3 lb

## 2014-04-27 DIAGNOSIS — C61 Malignant neoplasm of prostate: Secondary | ICD-10-CM

## 2014-04-27 DIAGNOSIS — Z510 Encounter for antineoplastic radiation therapy: Secondary | ICD-10-CM | POA: Diagnosis not present

## 2014-04-27 NOTE — Progress Notes (Signed)
Patient denies pain, fatigue, urinary/bowel issues, loss of appetite.

## 2014-04-27 NOTE — Progress Notes (Signed)
Weekly Management Note:  Site: Prostate Current Dose:  540  cGy Projected Dose: 4500  cGy followed by seed implant boost  Narrative: The patient is seen today for routine under treatment assessment. CBCT/MVCT images/port films were reviewed. The chart was reviewed.   Bladder filling satisfactory.  No GU or GI difficulties.  Physical Examination:  Filed Vitals:   04/27/14 0852  Temp: 98.3 F (36.8 C)  Resp: 18  .  Weight: 184 lb 4.8 oz (83.598 kg).  No change.  Impression: Tolerating radiation therapy well.  Plan: Continue radiation therapy as planned.

## 2014-04-28 ENCOUNTER — Ambulatory Visit
Admission: RE | Admit: 2014-04-28 | Discharge: 2014-04-28 | Disposition: A | Discharge status: Home / Self Care | Payer: Medicare Other | Source: Ambulatory Visit | Attending: Radiation Oncology | Admitting: Radiation Oncology

## 2014-04-28 DIAGNOSIS — Z510 Encounter for antineoplastic radiation therapy: Secondary | ICD-10-CM | POA: Diagnosis not present

## 2014-04-29 ENCOUNTER — Encounter: Payer: Self-pay | Admitting: Radiation Oncology

## 2014-04-29 ENCOUNTER — Ambulatory Visit
Admission: RE | Admit: 2014-04-29 | Discharge: 2014-04-29 | Disposition: A | Discharge status: Home / Self Care | Payer: Medicare Other | Source: Ambulatory Visit | Attending: Radiation Oncology | Admitting: Radiation Oncology

## 2014-04-29 DIAGNOSIS — Z510 Encounter for antineoplastic radiation therapy: Secondary | ICD-10-CM | POA: Diagnosis not present

## 2014-04-30 ENCOUNTER — Ambulatory Visit
Admission: RE | Admit: 2014-04-30 | Discharge: 2014-04-30 | Disposition: A | Discharge status: Home / Self Care | Payer: Medicare Other | Source: Ambulatory Visit | Attending: Radiation Oncology | Admitting: Radiation Oncology

## 2014-04-30 DIAGNOSIS — Z510 Encounter for antineoplastic radiation therapy: Secondary | ICD-10-CM | POA: Diagnosis not present

## 2014-05-01 ENCOUNTER — Ambulatory Visit
Admission: RE | Admit: 2014-05-01 | Discharge: 2014-05-01 | Disposition: A | Discharge status: Home / Self Care | Payer: Medicare Other | Source: Ambulatory Visit | Attending: Radiation Oncology | Admitting: Radiation Oncology

## 2014-05-01 DIAGNOSIS — Z510 Encounter for antineoplastic radiation therapy: Secondary | ICD-10-CM | POA: Diagnosis not present

## 2014-05-04 ENCOUNTER — Encounter: Payer: Self-pay | Admitting: Radiation Oncology

## 2014-05-04 ENCOUNTER — Ambulatory Visit
Admission: RE | Admit: 2014-05-04 | Discharge: 2014-05-04 | Disposition: A | Discharge status: Home / Self Care | Payer: Medicare Other | Source: Ambulatory Visit | Attending: Radiation Oncology | Admitting: Radiation Oncology

## 2014-05-04 VITALS — BP 132/77 | HR 89 | Temp 98.1°F | Resp 20 | Wt 187.3 lb

## 2014-05-04 DIAGNOSIS — C61 Malignant neoplasm of prostate: Secondary | ICD-10-CM

## 2014-05-04 DIAGNOSIS — Z510 Encounter for antineoplastic radiation therapy: Secondary | ICD-10-CM | POA: Diagnosis not present

## 2014-05-04 NOTE — Progress Notes (Signed)
Patient denies pain, fatigue, loss of appetite, urinary/bowel issues. He reports that he had once occurrence in the middle of the night last Fri where he had difficulty initiating his urinary stream. He denies burning, but states he had lower abd pain which he feels was due to his bladder being full. This was the only occurrence, no difficulty urinating since.

## 2014-05-04 NOTE — Progress Notes (Signed)
Weekly Management Note:  Site: Prostate  Current Dose:  1440  cGy Projected Dose: 4500  cGy, followed by prostate implant boost  Narrative: The patient is seen today for routine under treatment assessment. CBCT/MVCT images/port films were reviewed. The chart was reviewed.   Bladder filling satisfactory.  He did have trouble initiating his urinary stream this past Friday.  He did better over the weekend.  His pretreatment I PSS score was 10.  He is scheduled for his prostate seed implant on February 10.  Physical Examination:  Filed Vitals:   05/04/14 0846  BP: 132/77  Pulse: 89  Temp: 98.1 F (36.7 C)  Resp: 20  .  Weight: 187 lb 4.8 oz (84.959 kg).  No change.  Impression: Tolerating radiation therapy well.  I told Jeremiah [name] to make sure that he continues with his tamsulosin.  Plan: Continue radiation therapy as planned.

## 2014-05-05 ENCOUNTER — Ambulatory Visit
Admission: RE | Admit: 2014-05-05 | Discharge: 2014-05-05 | Disposition: A | Discharge status: Home / Self Care | Payer: Medicare Other | Source: Ambulatory Visit | Attending: Radiation Oncology | Admitting: Radiation Oncology

## 2014-05-05 DIAGNOSIS — Z510 Encounter for antineoplastic radiation therapy: Secondary | ICD-10-CM | POA: Diagnosis not present

## 2014-05-06 ENCOUNTER — Ambulatory Visit
Admission: RE | Admit: 2014-05-06 | Discharge: 2014-05-06 | Disposition: A | Discharge status: Home / Self Care | Payer: Medicare Other | Source: Ambulatory Visit | Attending: Radiation Oncology | Admitting: Radiation Oncology

## 2014-05-06 DIAGNOSIS — Z510 Encounter for antineoplastic radiation therapy: Secondary | ICD-10-CM | POA: Diagnosis not present

## 2014-05-07 ENCOUNTER — Ambulatory Visit
Admission: RE | Admit: 2014-05-07 | Discharge: 2014-05-07 | Disposition: A | Discharge status: Home / Self Care | Payer: Medicare Other | Source: Ambulatory Visit | Attending: Radiation Oncology | Admitting: Radiation Oncology

## 2014-05-07 DIAGNOSIS — Z510 Encounter for antineoplastic radiation therapy: Secondary | ICD-10-CM | POA: Diagnosis not present

## 2014-05-08 ENCOUNTER — Ambulatory Visit
Admission: RE | Admit: 2014-05-08 | Discharge: 2014-05-08 | Disposition: A | Discharge status: Home / Self Care | Payer: Medicare Other | Source: Ambulatory Visit | Attending: Radiation Oncology | Admitting: Radiation Oncology

## 2014-05-08 DIAGNOSIS — Z510 Encounter for antineoplastic radiation therapy: Secondary | ICD-10-CM | POA: Diagnosis not present

## 2014-05-11 ENCOUNTER — Encounter: Payer: Self-pay | Admitting: Radiation Oncology

## 2014-05-11 ENCOUNTER — Ambulatory Visit
Admission: RE | Admit: 2014-05-11 | Discharge: 2014-05-11 | Disposition: A | Discharge status: Home / Self Care | Payer: Medicare Other | Source: Ambulatory Visit | Attending: Radiation Oncology | Admitting: Radiation Oncology

## 2014-05-11 VITALS — BP 133/83 | HR 84 | Temp 98.7°F | Wt 187.6 lb

## 2014-05-11 DIAGNOSIS — C61 Malignant neoplasm of prostate: Secondary | ICD-10-CM

## 2014-05-11 DIAGNOSIS — Z510 Encounter for antineoplastic radiation therapy: Secondary | ICD-10-CM | POA: Diagnosis not present

## 2014-05-11 NOTE — Progress Notes (Signed)
Weekly Management Note:  Site: Prostate  Current Dose:  2340cGy Projected Dose: 4500  cGy, followed by prostate implant boost  Narrative: The patient is seen today for routine under treatment assessment. CBCT/MVCT images/port films were reviewed. The chart was reviewed.    Frequency and urgency of urination without burning. Stable. No other problems  Physical Examination:  Filed Vitals:   05/11/14 0859  BP: 133/83  Pulse: 84  Temp: 98.7 F (37.1 C)  .  Weight: 187 lb 9.6 oz (85.095 kg). NAD   Impression: Tolerating radiation therapy well.   Plan: Continue radiation therapy as planned.  -----------------------------------  Eppie Gibson, MD

## 2014-05-11 NOTE — Progress Notes (Signed)
Weekly assessment of radiation to pelvis for prostate cancer.Completed 13 of 25 treatments.Frequency and urgency of urination without burning.No other problems.

## 2014-05-12 ENCOUNTER — Ambulatory Visit
Admission: RE | Admit: 2014-05-12 | Discharge: 2014-05-12 | Disposition: A | Discharge status: Home / Self Care | Payer: Medicare Other | Source: Ambulatory Visit | Attending: Radiation Oncology | Admitting: Radiation Oncology

## 2014-05-12 DIAGNOSIS — Z510 Encounter for antineoplastic radiation therapy: Secondary | ICD-10-CM | POA: Diagnosis not present

## 2014-05-13 ENCOUNTER — Ambulatory Visit
Admission: RE | Admit: 2014-05-13 | Discharge: 2014-05-13 | Disposition: A | Discharge status: Home / Self Care | Payer: Medicare Other | Source: Ambulatory Visit | Attending: Radiation Oncology | Admitting: Radiation Oncology

## 2014-05-13 DIAGNOSIS — Z510 Encounter for antineoplastic radiation therapy: Secondary | ICD-10-CM | POA: Diagnosis not present

## 2014-05-14 ENCOUNTER — Ambulatory Visit
Admission: RE | Admit: 2014-05-14 | Discharge: 2014-05-14 | Disposition: A | Discharge status: Home / Self Care | Payer: Medicare Other | Source: Ambulatory Visit | Attending: Radiation Oncology | Admitting: Radiation Oncology

## 2014-05-14 DIAGNOSIS — Z510 Encounter for antineoplastic radiation therapy: Secondary | ICD-10-CM | POA: Diagnosis not present

## 2014-05-18 ENCOUNTER — Ambulatory Visit
Admission: RE | Admit: 2014-05-18 | Discharge: 2014-05-18 | Disposition: A | Discharge status: Home / Self Care | Payer: Medicare Other | Source: Ambulatory Visit | Attending: Radiation Oncology | Admitting: Radiation Oncology

## 2014-05-18 ENCOUNTER — Encounter: Payer: Self-pay | Admitting: Radiation Oncology

## 2014-05-18 VITALS — BP 122/44 | HR 79 | Temp 98.4°F | Resp 20 | Wt 189.0 lb

## 2014-05-18 DIAGNOSIS — Z510 Encounter for antineoplastic radiation therapy: Secondary | ICD-10-CM | POA: Diagnosis not present

## 2014-05-18 DIAGNOSIS — C61 Malignant neoplasm of prostate: Secondary | ICD-10-CM

## 2014-05-18 NOTE — Progress Notes (Signed)
Weekly Management Note:  Site: Prostate Current Dose:  3060  cGy Projected Dose: 4500  cGy, followed by seed implant boost  Narrative: The patient is seen today for routine under treatment assessment. CBCT/MVCT images/port films were reviewed. The chart was reviewed.   Bladder filling is satisfactory.  He is doing well from a GU and GI standpoint except for nocturia 6 on Christmas day.  He has now returned to his baseline.  Physical Examination:  Filed Vitals:   05/18/14 0839  BP: 122/44  Pulse: 79  Temp: 98.4 F (36.9 C)  Resp: 20  .  Weight: 189 lb (85.73 kg).  No change.  Impression: Tolerating radiation therapy well.  Plan: Continue radiation therapy as planned.

## 2014-05-18 NOTE — Progress Notes (Signed)
Weekly rad txs, prostate 17/25 completed, regular bowel movements, no dysuria,no hematuria, Christmas morning had nocturiax6, has resolved since stated patient, appetite good, no fatigue, no pain 8:42 AM

## 2014-05-19 ENCOUNTER — Ambulatory Visit
Admission: RE | Admit: 2014-05-19 | Discharge: 2014-05-19 | Disposition: A | Discharge status: Home / Self Care | Payer: Medicare Other | Source: Ambulatory Visit | Attending: Radiation Oncology | Admitting: Radiation Oncology

## 2014-05-19 DIAGNOSIS — Z510 Encounter for antineoplastic radiation therapy: Secondary | ICD-10-CM | POA: Diagnosis not present

## 2014-05-20 ENCOUNTER — Ambulatory Visit
Admission: RE | Admit: 2014-05-20 | Discharge: 2014-05-20 | Disposition: A | Discharge status: Home / Self Care | Payer: Medicare Other | Source: Ambulatory Visit | Attending: Radiation Oncology | Admitting: Radiation Oncology

## 2014-05-20 DIAGNOSIS — Z510 Encounter for antineoplastic radiation therapy: Secondary | ICD-10-CM | POA: Diagnosis not present

## 2014-05-21 ENCOUNTER — Encounter: Payer: Self-pay | Admitting: Radiation Oncology

## 2014-05-21 ENCOUNTER — Ambulatory Visit
Admission: RE | Admit: 2014-05-21 | Discharge: 2014-05-21 | Disposition: A | Discharge status: Home / Self Care | Payer: Medicare Other | Source: Ambulatory Visit | Attending: Radiation Oncology | Admitting: Radiation Oncology

## 2014-05-21 VITALS — BP 127/90 | HR 94 | Temp 98.4°F | Resp 20

## 2014-05-21 DIAGNOSIS — Z510 Encounter for antineoplastic radiation therapy: Secondary | ICD-10-CM | POA: Diagnosis not present

## 2014-05-21 DIAGNOSIS — C61 Malignant neoplasm of prostate: Secondary | ICD-10-CM

## 2014-05-21 NOTE — Progress Notes (Signed)
CC: Dr. Rana Snare   Weekly Management Note:  Site: Prostate Current Dose:  3600  cGy Projected Dose: 4500  cGy  Narrative: The patient is seen today for routine under treatment assessment. CBCT/MVCT images/port films were reviewed. The chart was reviewed.   His bladder filling today is good.  It is not excessive.  He had trouble voiding last night on multiple occasions with a weak urinary stream.  He does not feel that he can emptying his bladder.  No dysuria.  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  His I PSS score prior to radiation therapy was 10.  Physicalcal Examination:  Filed Vitals:   05/21/14 0820  BP: 127/90  Pulse: 94  Temp: 98.4 F (36.9 C)  Resp: 20  .  Weight:  . No change.    Impression: Tolerating radiation therapy well, except for worsening obstructive symptomatology.  I spoke with [facility], and he will see the nurse practitioner this morning.  She may increase his tamsulosin to a twice a day schedule or start Rapaflo.  With a schedule seed implant boost, he may run into more difficulty with obstructive symptoms, but according to our schedule he will have approximately a one-month rest between completion of his 5 weeks of external beam and his seed implant.  Plan: As above.

## 2014-05-21 NOTE — Progress Notes (Signed)
Patient brought to nursing following treatment this morning c/o "pain, getting up many times last night to urinate but unable to urinate, urgency, straining to urinate". He denies dysuria, dark urine or foul odor but states it may be due to him voiding a very little amount.  He states he has voided once this morning, a small amount. He denies pain at this time, denies diarrhea or other issues.

## 2014-05-25 ENCOUNTER — Encounter: Payer: Self-pay | Admitting: Radiation Oncology

## 2014-05-25 ENCOUNTER — Ambulatory Visit
Admission: RE | Admit: 2014-05-25 | Discharge: 2014-05-25 | Disposition: A | Discharge status: Home / Self Care | Payer: Medicare Other | Source: Ambulatory Visit | Attending: Radiation Oncology | Admitting: Radiation Oncology

## 2014-05-25 VITALS — BP 123/74 | HR 90 | Temp 98.2°F | Resp 12 | Wt 186.5 lb

## 2014-05-25 DIAGNOSIS — C61 Malignant neoplasm of prostate: Secondary | ICD-10-CM

## 2014-05-25 DIAGNOSIS — Z510 Encounter for antineoplastic radiation therapy: Secondary | ICD-10-CM | POA: Diagnosis not present

## 2014-05-25 NOTE — Progress Notes (Signed)
He is currently in no pain. Pt complains of Loss of Sleep and Fatigue.  Reports urinary frequency, urinary retention and flank pain on bilateral Dribbling.  Pt states they urinate more than 5 times per night. Reports he was having difficult time urinating and was seen Dr. Risa Grill office (urologist) on Thursday 05/21/14.  Started him on Uribel bid.  Pt reports retention has improved since this med change.  Pt reports soft bowel movement everyday.

## 2014-05-25 NOTE — Progress Notes (Signed)
Weekly Management Note:  Site: Prostate Current Dose:  3780  cGy Projected Dose: 4500  cGy followed by seed implant boost  Narrative: The patient is seen today for routine under treatment assessment. CBCT/MVCT images/port films were reviewed. The chart was reviewed.   Bladder filling today is excellent.  He was seen by [facility] (Diane NP) and started on Uribel after a post void ultrasound study.  He also continues with his tamsulosin.  He states that his urinary frequency is much improved.  No GI difficulties.  Physical Examination:  Filed Vitals:   05/25/14 0852  BP: 123/74  Pulse: 90  Temp: 98.2 F (36.8 C)  Resp: 12  .  Weight: 186 lb 8 oz (84.596 kg).  No change.  Impression: Tolerating radiation therapy well.  He is improved on Uribel.  Plan: Continue radiation therapy as planned.  He will finish this Friday.  I will see him in the treatment area this Thursday.

## 2014-05-26 ENCOUNTER — Ambulatory Visit
Admission: RE | Admit: 2014-05-26 | Discharge: 2014-05-26 | Disposition: A | Discharge status: Home / Self Care | Payer: Medicare Other | Source: Ambulatory Visit | Attending: Radiation Oncology | Admitting: Radiation Oncology

## 2014-05-26 DIAGNOSIS — Z510 Encounter for antineoplastic radiation therapy: Secondary | ICD-10-CM | POA: Diagnosis not present

## 2014-05-27 ENCOUNTER — Encounter (HOSPITAL_BASED_OUTPATIENT_CLINIC_OR_DEPARTMENT_OTHER)
Admission: RE | Admit: 2014-05-27 | Discharge: 2014-05-27 | Disposition: A | Discharge status: Home / Self Care | Payer: Medicare Other | Source: Ambulatory Visit | Attending: Urology | Admitting: Urology

## 2014-05-27 ENCOUNTER — Ambulatory Visit
Admission: RE | Admit: 2014-05-27 | Discharge: 2014-05-27 | Disposition: A | Discharge status: Home / Self Care | Payer: Medicare Other | Source: Ambulatory Visit | Attending: Radiation Oncology | Admitting: Radiation Oncology

## 2014-05-27 ENCOUNTER — Ambulatory Visit (HOSPITAL_BASED_OUTPATIENT_CLINIC_OR_DEPARTMENT_OTHER)
Admission: RE | Admit: 2014-05-27 | Discharge: 2014-05-27 | Disposition: A | Discharge status: Home / Self Care | Payer: Medicare Other | Source: Ambulatory Visit | Attending: Urology | Admitting: Urology

## 2014-05-27 DIAGNOSIS — Z510 Encounter for antineoplastic radiation therapy: Secondary | ICD-10-CM | POA: Diagnosis not present

## 2014-05-27 DIAGNOSIS — I10 Essential (primary) hypertension: Secondary | ICD-10-CM | POA: Diagnosis not present

## 2014-05-27 DIAGNOSIS — Z01810 Encounter for preprocedural cardiovascular examination: Secondary | ICD-10-CM | POA: Insufficient documentation

## 2014-05-28 ENCOUNTER — Ambulatory Visit
Admission: RE | Admit: 2014-05-28 | Discharge: 2014-05-28 | Disposition: A | Discharge status: Home / Self Care | Payer: Medicare Other | Source: Ambulatory Visit | Attending: Radiation Oncology | Admitting: Radiation Oncology

## 2014-05-28 DIAGNOSIS — Z510 Encounter for antineoplastic radiation therapy: Secondary | ICD-10-CM | POA: Diagnosis not present

## 2014-05-29 ENCOUNTER — Ambulatory Visit
Admission: RE | Admit: 2014-05-29 | Discharge: 2014-05-29 | Disposition: A | Discharge status: Home / Self Care | Payer: Medicare Other | Source: Ambulatory Visit | Attending: Radiation Oncology | Admitting: Radiation Oncology

## 2014-05-29 DIAGNOSIS — Z510 Encounter for antineoplastic radiation therapy: Secondary | ICD-10-CM | POA: Diagnosis not present

## 2014-06-01 ENCOUNTER — Encounter: Payer: Self-pay | Admitting: Radiation Oncology

## 2014-06-01 NOTE — Progress Notes (Signed)
[facility] End of Treatment Note  Name:Jeremiah [name]  Date: 06/01/2014 [id number] DOB:1940/12/27   Status:outpatient    CC: [name], MD  Dr. Rana Snare  REFERRING PHYSICIAN:   Dr. 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 Cisse tolerated his treatment reasonably well, however he developed what I thought was worsening obstructive symptoms toward the end of his treatment while on tamsulosin.  He was seen by [facility] and started on Uribel with improvement of his symptoms.                           PLAN: He is scheduled for his prostate seed implant boost on 07/01/2014 with Dr. Risa Grill.

## 2014-06-23 ENCOUNTER — Telehealth: Payer: Self-pay | Admitting: *Deleted

## 2014-06-23 NOTE — Telephone Encounter (Signed)
CALLED PATIENT TO REMIND OF LABS FOR 06-24-14, SPOKE WITH PATIENT AND HE IS AWARE OF THIS APPT.

## 2014-06-24 DIAGNOSIS — Z7982 Long term (current) use of aspirin: Secondary | ICD-10-CM | POA: Diagnosis not present

## 2014-06-24 DIAGNOSIS — Z85828 Personal history of other malignant neoplasm of skin: Secondary | ICD-10-CM | POA: Diagnosis not present

## 2014-06-24 DIAGNOSIS — I10 Essential (primary) hypertension: Secondary | ICD-10-CM | POA: Diagnosis not present

## 2014-06-24 DIAGNOSIS — E119 Type 2 diabetes mellitus without complications: Secondary | ICD-10-CM | POA: Diagnosis not present

## 2014-06-24 DIAGNOSIS — C61 Malignant neoplasm of prostate: Secondary | ICD-10-CM | POA: Diagnosis not present

## 2014-06-24 DIAGNOSIS — M1990 Unspecified osteoarthritis, unspecified site: Secondary | ICD-10-CM | POA: Diagnosis not present

## 2014-06-24 DIAGNOSIS — Z79899 Other long term (current) drug therapy: Secondary | ICD-10-CM | POA: Diagnosis not present

## 2014-06-24 DIAGNOSIS — E785 Hyperlipidemia, unspecified: Secondary | ICD-10-CM | POA: Diagnosis not present

## 2014-06-24 DIAGNOSIS — Z8042 Family history of malignant neoplasm of prostate: Secondary | ICD-10-CM | POA: Diagnosis not present

## 2014-06-24 DIAGNOSIS — Z87891 Personal history of nicotine dependence: Secondary | ICD-10-CM | POA: Diagnosis not present

## 2014-06-24 LAB — CBC
HCT: 50 % (ref 39.0–52.0)
Hemoglobin: 16.9 g/dL (ref 13.0–17.0)
MCH: 32.2 pg (ref 26.0–34.0)
MCHC: 33.8 g/dL (ref 30.0–36.0)
MCV: 95.2 fL (ref 78.0–100.0)
Platelets: 148 10*3/uL — ABNORMAL LOW (ref 150–400)
RBC: 5.25 MIL/uL (ref 4.22–5.81)
RDW: 13.6 % (ref 11.5–15.5)
WBC: 6.1 10*3/uL (ref 4.0–10.5)

## 2014-06-24 LAB — COMPREHENSIVE METABOLIC PANEL
ALT: 27 U/L (ref 0–53)
AST: 32 U/L (ref 0–37)
Albumin: 4 g/dL (ref 3.5–5.2)
Alkaline Phosphatase: 61 U/L (ref 39–117)
Anion gap: 10 (ref 5–15)
BUN: 26 mg/dL — ABNORMAL HIGH (ref 6–23)
CO2: 33 mmol/L — ABNORMAL HIGH (ref 19–32)
Calcium: 9.2 mg/dL (ref 8.4–10.5)
Chloride: 100 mmol/L (ref 96–112)
Creatinine, Ser: 1.1 mg/dL (ref 0.50–1.35)
GFR calc Af Amer: 74 mL/min — ABNORMAL LOW (ref >90)
GFR calc non Af Amer: 64 mL/min — ABNORMAL LOW (ref >90)
Glucose, Bld: 118 mg/dL — ABNORMAL HIGH (ref 70–99)
Potassium: 3.2 mmol/L — ABNORMAL LOW (ref 3.5–5.1)
Sodium: 143 mmol/L (ref 135–145)
Total Bilirubin: 1.3 mg/dL — ABNORMAL HIGH (ref 0.3–1.2)
Total Protein: 6.8 g/dL (ref 6.0–8.3)

## 2014-06-24 LAB — PROTIME-INR
INR: 1.02 (ref 0.00–1.49)
Prothrombin Time: 13.5 seconds (ref 11.6–15.2)

## 2014-06-24 LAB — APTT: aPTT: 26 seconds (ref 24–37)

## 2014-06-25 ENCOUNTER — Encounter (HOSPITAL_BASED_OUTPATIENT_CLINIC_OR_DEPARTMENT_OTHER): Payer: Self-pay | Admitting: *Deleted

## 2014-06-25 NOTE — Progress Notes (Signed)
Pt instructed npo pmn 2/9. To [facility]:[place] 2/10 @ 0700.  Labs, cxr, ekg in epic.  Pt to do fleets enema am of surgery ,pta

## 2014-06-25 NOTE — Progress Notes (Signed)
   06/25/14 1527  OBSTRUCTIVE SLEEP APNEA  Have you ever been diagnosed with sleep apnea through a sleep study? No  Do you snore loudly (loud enough to be heard through closed doors)?  0  Do you often feel tired, fatigued, or sleepy during the daytime? 0  Has anyone observed you stop breathing during your sleep? 1  Do you have, or are you being treated for high blood pressure? 1  BMI more than 35 kg/m2? 0  Age over 75 years old? 1  Neck circumference greater than 40 cm/16 inches? 0  Gender: 1  Obstructive Sleep Apnea Score 4

## 2014-06-30 ENCOUNTER — Ambulatory Visit: Payer: Medicare Other | Admitting: Radiation Oncology

## 2014-06-30 ENCOUNTER — Telehealth: Payer: Self-pay | Admitting: *Deleted

## 2014-06-30 ENCOUNTER — Encounter (HOSPITAL_BASED_OUTPATIENT_CLINIC_OR_DEPARTMENT_OTHER): Payer: Self-pay | Admitting: Anesthesiology

## 2014-06-30 NOTE — Telephone Encounter (Signed)
CALLED PATIENT TO REMIND OF PROCEDURE FOR 07-01-14, SPOKE WITH PATIENT AND HE IS AWARE OF THIS PROCEDURE

## 2014-06-30 NOTE — H&P (Signed)
Reason For Visit     Jeremiah [name] presents today for seed boost as part of his ongoing management for his intermediate risk prostate cancer.  He has completed his external beam radiation therapy.    History of Present Illness   Past urologic history:       Beamon was sent to see me in 2012 with a relatively mildly elevated PSA of 6.5. That was repeated here and found to be 5.8. Rectal exam was unremarkable. At the time of ultrasonography ( 1 /2013), the patient had a prostate of just under 50 grams. No other worrisome features were noted.     Unfortunately, the biopsies were positive for adenocarcinoma. All right-sided biopsies were negative. On the left side 2 out of 6 core biopsies were positive both for Gleason 3+3=6 cancer. Five percent core involvement and 20% core involvement in those two cores.     After careful consideration and extensive prostate cancer discussion he shows active surveillance. He has been monitored at four-month intervals. PSA is creeped up slowly and was 7.0 in October of 2013.     Shanon Brow underwent repeat ultrasound and biopsy of the prostate as part of normal active surveillance in January of 2014. Repeat ultrasound revealed a prostate that was consistent at approximately 47 g.  The patient had 3 positive biopsies from the left lobe. All positive biopsies were Gleason 6. Core involvement was less than 5% to 30%. This is relatively unchanged from a year prior at which time he had 2 positive cores for Gleason 6 cancer. Also on the left side.    PSA has continued to slowly increase and was 8.3 in January 2015 and is now 9.7 in July 2015.   In September 2015 he underwent repeat biopsy. Prostate was slightly larger at 53 g All right-sided biopsies were again negative. On the left side the patient's biopsies now show Gleason 7 cancer in all 3 positive cores. Core involvement is between 5 and 30%. His situation is now best classified as intermediate risk.  Past  Medical History Problems  1. History of Diverticulosis (K57.90) 2. History of diabetes mellitus (Z86.39) 3. History of hyperlipidemia (Z86.39) 4. History of hypertension (Z86.79) 5. History of type 2 diabetes mellitus (Z86.39) 6. History of Osteoarthritis 7. History of Skin Cancer  Surgical History Problems  1. History of Back Surgery 2. History of Colonoscopy (Fiberoptic) 3. History of Excision Of Lesion Scalp  Current Meds 1. Aspirin 325 MG Oral Tablet;  Therapy: (Recorded:16Jan2014) to Recorded 2. Calcium/Vitamin D/Minerals TABS;  Therapy: (Recorded:30Nov2012) to Recorded 3. Citrucel TABS;  Therapy: (Recorded:30Nov2012) to Recorded 4. Indapamide 2.5 MG Oral Tablet;  Therapy: (Recorded:30Nov2012) to Recorded 5. Lipitor 10 MG Oral Tablet;  Therapy: (Recorded:21Nov2012) to Recorded 6. Potassium Chloride 20 MEQ Oral Packet;  Therapy: (Recorded:30Nov2012) to Recorded 7. Tamsulosin HCl - 0.4 MG Oral Capsule; TAKE 1 CAPSULE BY MOUTH DAILY;  Therapy: [id number] to (Last Rx:25Aug2015)  Requested for: 25Aug2015 Ordered 8. Vitamin D TABS;  Therapy: (Recorded:30Nov2012) to Recorded  Allergies Medication  1. No Known Drug Allergies  Family History Problems  1. Family history of Family Health Status - Mother's Age   80 2. Family history of Family Health Status Number Of Children   2 children 3. Family history of Father Deceased At Age 84   Heart attack 4. Family history of Prostate Cancer  Social History Problems  1. Denied: History of Alcohol Use 2. Caffeine Use 3. Former smoker [phone]) 4. Marital History - Currently Married 5. 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 Retired From Work 7. Denied: History of Tobacco Use  Review of Systems Genitourinary, constitutional, skin, eye, otolaryngeal, hematologic/lymphatic, cardiovascular, pulmonary, endocrine, musculoskeletal, gastrointestinal, neurological and psychiatric system(s) were reviewed and pertinent findings  if present are noted.  Genitourinary: urinary frequency, feelings of urinary urgency, nocturia, difficulty starting the urinary stream and erectile dysfunction, but no dysuria and no hematuria.  Gastrointestinal: heartburn.  Constitutional: feeling tired (fatigue).  Hematologic/Lymphatic: a tendency to easily bruise.     Exam:  Well-developed well-nourished male in no acute distress  Respiratory: Normal effort Cardiac: Regular rate and rhythm Abdomen: Soft nontender Genitourinary: Within normal limits extremities: No edema tenderness  Assessment Assessed  1. Prostate cancer (C61)  Plan Prostate cancer  1. Follow-up Month x 4 Office  Follow-up  Status: Hold For - Appointment,Date of Service   Requested for: 05Oct2015 2. PSA; Status:Hold For - Specimen/Data Collection,Appointment; Requested  [id number];  3. Radiation Oncology Referral Referral  Referral  Status: Hold For - Appointment,Records   Requested for: 05Oct2015  Discussion/Summary     Aaron has intermediate risk prostate cancer.  He has completed external beam radiation therapy and presents today for a prostate seed boost.

## 2014-06-30 NOTE — Anesthesia Preprocedure Evaluation (Addendum)
Anesthesia Evaluation  Patient identified by MRN, date of birth, ID band Patient awake    Reviewed: Allergy & Precautions, NPO status , Patient's Chart, lab work & pertinent test results  Airway Mallampati: II  TM Distance: >3 FB Neck ROM: Full    Dental no notable dental hx.    Pulmonary former smoker,  CXR reviewed. breath sounds clear to auscultation  Pulmonary exam normal       Cardiovascular Exercise Tolerance: Good hypertension, Pt. on medications Rhythm:Regular Rate:Normal  ECG: cannot rule out anterior infarct, no significant change.  Denies cardiopulmonary symptoms.   Neuro/Psych negative neurological ROS  negative psych ROS   GI/Hepatic negative GI ROS, Neg liver ROS,   Endo/Other  diabetes, Type 2Diet control  Renal/GU negative Renal ROS  negative genitourinary   Musculoskeletal  (+) Arthritis -,   Abdominal   Peds negative pediatric ROS (+)  Hematology negative hematology ROS (+)   Anesthesia Other Findings   Reproductive/Obstetrics negative OB ROS                           Anesthesia Physical Anesthesia Plan  ASA: II  Anesthesia Plan: General   Post-op Pain Management:    Induction: Intravenous  Airway Management Planned: LMA  Additional Equipment:   Intra-op Plan:   Post-operative Plan: Extubation in OR  Informed Consent: I have reviewed the patients History and Physical, chart, labs and discussed the procedure including the risks, benefits and alternatives for the proposed anesthesia with the patient or authorized representative who has indicated his/her understanding and acceptance.   Dental advisory given  Plan Discussed with: CRNA  Anesthesia Plan Comments:         Anesthesia Quick Evaluation

## 2014-07-01 ENCOUNTER — Ambulatory Visit (HOSPITAL_COMMUNITY): Payer: Medicare Other

## 2014-07-01 ENCOUNTER — Ambulatory Visit (HOSPITAL_BASED_OUTPATIENT_CLINIC_OR_DEPARTMENT_OTHER)
Admission: RE | Admit: 2014-07-01 | Discharge: 2014-07-01 | Disposition: A | Discharge status: Home / Self Care | Payer: Medicare Other | Source: Ambulatory Visit | Attending: Urology | Admitting: Urology

## 2014-07-01 ENCOUNTER — Ambulatory Visit (HOSPITAL_BASED_OUTPATIENT_CLINIC_OR_DEPARTMENT_OTHER): Payer: Medicare Other | Admitting: Anesthesiology

## 2014-07-01 ENCOUNTER — Encounter (HOSPITAL_BASED_OUTPATIENT_CLINIC_OR_DEPARTMENT_OTHER)
Admission: RE | Disposition: A | Discharge status: Home / Self Care | Payer: Self-pay | Source: Ambulatory Visit | Attending: Urology

## 2014-07-01 ENCOUNTER — Encounter (HOSPITAL_BASED_OUTPATIENT_CLINIC_OR_DEPARTMENT_OTHER): Payer: Self-pay | Admitting: *Deleted

## 2014-07-01 ENCOUNTER — Encounter: Payer: Self-pay | Admitting: Radiation Oncology

## 2014-07-01 DIAGNOSIS — M1990 Unspecified osteoarthritis, unspecified site: Secondary | ICD-10-CM | POA: Insufficient documentation

## 2014-07-01 DIAGNOSIS — E785 Hyperlipidemia, unspecified: Secondary | ICD-10-CM | POA: Insufficient documentation

## 2014-07-01 DIAGNOSIS — I10 Essential (primary) hypertension: Secondary | ICD-10-CM | POA: Insufficient documentation

## 2014-07-01 DIAGNOSIS — Z01818 Encounter for other preprocedural examination: Secondary | ICD-10-CM

## 2014-07-01 DIAGNOSIS — Z85828 Personal history of other malignant neoplasm of skin: Secondary | ICD-10-CM | POA: Insufficient documentation

## 2014-07-01 DIAGNOSIS — Z79899 Other long term (current) drug therapy: Secondary | ICD-10-CM | POA: Insufficient documentation

## 2014-07-01 DIAGNOSIS — Z8042 Family history of malignant neoplasm of prostate: Secondary | ICD-10-CM | POA: Insufficient documentation

## 2014-07-01 DIAGNOSIS — C61 Malignant neoplasm of prostate: Secondary | ICD-10-CM | POA: Diagnosis not present

## 2014-07-01 DIAGNOSIS — E119 Type 2 diabetes mellitus without complications: Secondary | ICD-10-CM | POA: Insufficient documentation

## 2014-07-01 DIAGNOSIS — Z7982 Long term (current) use of aspirin: Secondary | ICD-10-CM | POA: Insufficient documentation

## 2014-07-01 DIAGNOSIS — Z87891 Personal history of nicotine dependence: Secondary | ICD-10-CM | POA: Insufficient documentation

## 2014-07-01 HISTORY — PX: RADIOACTIVE SEED IMPLANT: SHX5150

## 2014-07-01 LAB — GLUCOSE, CAPILLARY
Glucose-Capillary: 129 mg/dL — ABNORMAL HIGH (ref 70–99)
Glucose-Capillary: 133 mg/dL — ABNORMAL HIGH (ref 70–99)

## 2014-07-01 SURGERY — INSERTION, RADIATION SOURCE, PROSTATE
Anesthesia: General | Site: Prostate

## 2014-07-01 MED ORDER — HYDROCODONE-ACETAMINOPHEN 5-325 MG PO TABS: 1.0000 | ORAL_TABLET | Freq: Four times a day (QID) | ORAL | Status: DC | PRN

## 2014-07-01 MED ORDER — LIDOCAINE HCL 2 % EX GEL
CUTANEOUS | Status: DC | PRN
  Administered 2014-07-01: 1

## 2014-07-01 MED ORDER — GLYCOPYRROLATE 0.2 MG/ML IJ SOLN
INTRAMUSCULAR | Status: DC | PRN
  Administered 2014-07-01: .8 mg via INTRAVENOUS

## 2014-07-01 MED ORDER — EPHEDRINE SULFATE 50 MG/ML IJ SOLN
INTRAMUSCULAR | Status: DC | PRN
  Administered 2014-07-01 (×5): 10 mg via INTRAVENOUS

## 2014-07-01 MED ORDER — STERILE WATER FOR IRRIGATION IR SOLN
Status: DC | PRN
  Administered 2014-07-01: 1000 mL

## 2014-07-01 MED ORDER — LACTATED RINGERS IV SOLN
INTRAVENOUS | Status: DC
  Administered 2014-07-01 (×2): via INTRAVENOUS
  Filled 2014-07-01: qty 1000

## 2014-07-01 MED ORDER — IOHEXOL 350 MG/ML SOLN
INTRAVENOUS | Status: DC | PRN
  Administered 2014-07-01: 7 mL

## 2014-07-01 MED ORDER — KETOROLAC TROMETHAMINE 30 MG/ML IJ SOLN
INTRAMUSCULAR | Status: DC | PRN
  Administered 2014-07-01: 15 mg via INTRAVENOUS

## 2014-07-01 MED ORDER — STERILE WATER FOR IRRIGATION IR SOLN
Status: DC | PRN
  Administered 2014-07-01: 3000 mL

## 2014-07-01 MED ORDER — LIDOCAINE HCL (CARDIAC) 20 MG/ML IV SOLN
INTRAVENOUS | Status: DC | PRN
  Administered 2014-07-01: 40 mg via INTRAVENOUS
  Administered 2014-07-01: 60 mg via INTRAVENOUS

## 2014-07-01 MED ORDER — CIPROFLOXACIN IN D5W 400 MG/200ML IV SOLN
INTRAVENOUS | Status: AC
  Filled 2014-07-01: qty 200

## 2014-07-01 MED ORDER — PROMETHAZINE HCL 25 MG/ML IJ SOLN
6.2500 mg | INTRAMUSCULAR | Status: DC | PRN
Start: 2014-07-01 — End: 2014-07-01
  Filled 2014-07-01: qty 1

## 2014-07-01 MED ORDER — PROPOFOL 10 MG/ML IV BOLUS
INTRAVENOUS | Status: DC | PRN
  Administered 2014-07-01: 50 mg via INTRAVENOUS
  Administered 2014-07-01: 150 mg via INTRAVENOUS

## 2014-07-01 MED ORDER — ROCURONIUM BROMIDE 100 MG/10ML IV SOLN
INTRAVENOUS | Status: DC | PRN
  Administered 2014-07-01: 30 mg via INTRAVENOUS
  Administered 2014-07-01: 10 mg via INTRAVENOUS
  Administered 2014-07-01: 5 mg via INTRAVENOUS

## 2014-07-01 MED ORDER — CIPROFLOXACIN IN D5W 400 MG/200ML IV SOLN
400.0000 mg | INTRAVENOUS | Status: AC
  Administered 2014-07-01 (×2): 400 mg via INTRAVENOUS
  Filled 2014-07-01: qty 200

## 2014-07-01 MED ORDER — ONDANSETRON HCL 4 MG/2ML IJ SOLN
INTRAMUSCULAR | Status: DC | PRN
  Administered 2014-07-01: 4 mg via INTRAVENOUS

## 2014-07-01 MED ORDER — DEXAMETHASONE SODIUM PHOSPHATE 4 MG/ML IJ SOLN
INTRAMUSCULAR | Status: DC | PRN
  Administered 2014-07-01: 10 mg via INTRAVENOUS

## 2014-07-01 MED ORDER — LIDOCAINE HCL 4 % MT SOLN
OROMUCOSAL | Status: DC | PRN
  Administered 2014-07-01: 4 mL via TOPICAL

## 2014-07-01 MED ORDER — ACETAMINOPHEN 10 MG/ML IV SOLN
INTRAVENOUS | Status: DC | PRN
Start: 2014-07-01 — End: 2014-07-01
  Administered 2014-07-01: 1000 mg via INTRAVENOUS

## 2014-07-01 MED ORDER — CIPROFLOXACIN HCL 500 MG PO TABS: 500.0000 mg | ORAL_TABLET | Freq: Two times a day (BID) | ORAL | Status: DC

## 2014-07-01 MED ORDER — FLEET ENEMA 7-19 GM/118ML RE ENEM
1.0000 | ENEMA | Freq: Once | RECTAL | Status: DC
  Filled 2014-07-01: qty 1

## 2014-07-01 MED ORDER — FENTANYL CITRATE 0.05 MG/ML IJ SOLN
INTRAMUSCULAR | Status: AC
  Filled 2014-07-01: qty 6

## 2014-07-01 MED ORDER — SODIUM CHLORIDE 0.9 % IV SOLN
20.0000 mg | INTRAVENOUS | Status: DC | PRN
  Administered 2014-07-01: 50 ug/min via INTRAVENOUS

## 2014-07-01 MED ORDER — SUCCINYLCHOLINE CHLORIDE 20 MG/ML IJ SOLN
INTRAMUSCULAR | Status: DC | PRN
  Administered 2014-07-01: 100 mg via INTRAVENOUS

## 2014-07-01 MED ORDER — PHENYLEPHRINE HCL 10 MG/ML IJ SOLN
INTRAMUSCULAR | Status: DC | PRN
  Administered 2014-07-01 (×3): 40 ug via INTRAVENOUS

## 2014-07-01 MED ORDER — FENTANYL CITRATE 0.05 MG/ML IJ SOLN
25.0000 ug | INTRAMUSCULAR | Status: DC | PRN
  Filled 2014-07-01: qty 1

## 2014-07-01 MED ORDER — NEOSTIGMINE METHYLSULFATE 10 MG/10ML IV SOLN
INTRAVENOUS | Status: DC | PRN
  Administered 2014-07-01: 5 mg via INTRAVENOUS

## 2014-07-01 MED ORDER — FENTANYL CITRATE 0.05 MG/ML IJ SOLN
INTRAMUSCULAR | Status: DC | PRN
  Administered 2014-07-01: 25 ug via INTRAVENOUS
  Administered 2014-07-01 (×4): 50 ug via INTRAVENOUS
  Administered 2014-07-01: 25 ug via INTRAVENOUS

## 2014-07-01 SURGICAL SUPPLY — 26 items
BAG URINE DRAINAGE (UROLOGICAL SUPPLIES) ×2 IMPLANT
BLADE CLIPPER SURG (BLADE) ×2 IMPLANT
CATH FOLEY 2WAY SLVR  5CC 16FR (CATHETERS) ×2
CATH FOLEY 2WAY SLVR 5CC 16FR (CATHETERS) ×2 IMPLANT
CATH ROBINSON RED A/P 20FR (CATHETERS) ×2 IMPLANT
CLOTH BEACON ORANGE TIMEOUT ST (SAFETY) ×2 IMPLANT
COVER MAYO STAND STRL (DRAPES) ×2 IMPLANT
COVER TABLE BACK 60X90 (DRAPES) ×2 IMPLANT
DRSG TEGADERM 4X4.75 (GAUZE/BANDAGES/DRESSINGS) ×2 IMPLANT
DRSG TEGADERM 8X12 (GAUZE/BANDAGES/DRESSINGS) ×2 IMPLANT
GLOVE BIO SURGEON STRL SZ7.5 (GLOVE) ×8 IMPLANT
GLOVE ECLIPSE 8.0 STRL XLNG CF (GLOVE) IMPLANT
GLOVE INDICATOR 6.5 STRL GRN (GLOVE) ×1 IMPLANT
GLOVE INDICATOR 7.5 STRL GRN (GLOVE) ×1 IMPLANT
GLOVE SURG SS PI 7.5 STRL IVOR (GLOVE) ×1 IMPLANT
GOWN STRL REIN XL XLG (GOWN DISPOSABLE) ×1 IMPLANT
GOWN STRL REUS W/TWL XL LVL3 (GOWN DISPOSABLE) ×2 IMPLANT
HOLDER FOLEY CATH W/STRAP (MISCELLANEOUS) ×2 IMPLANT
PACK CYSTO (CUSTOM PROCEDURE TRAY) ×2 IMPLANT
PAD PREP 24X48 CUFFED NSTRL (MISCELLANEOUS) ×1 IMPLANT
Prostate seeds ×67 IMPLANT
SPONGE GAUZE 4X4 12PLY STER LF (GAUZE/BANDAGES/DRESSINGS) ×1 IMPLANT
SYRINGE 10CC LL (SYRINGE) ×2 IMPLANT
UNDERPAD 30X30 INCONTINENT (UNDERPADS AND DIAPERS) ×4 IMPLANT
WATER STERILE IRR 3000ML UROMA (IV SOLUTION) ×1 IMPLANT
WATER STERILE IRR 500ML POUR (IV SOLUTION) ×2 IMPLANT

## 2014-07-01 NOTE — Progress Notes (Signed)
[facility] Operative Procedure Note  Name: Jeremiah [name] MRN: [id number]  Date:   04/20/2014           DOB: [birth year]  Status:outpatient    UR:[id number], MD  Dr. Rana Snare  DIAGNOSIS: A 74 year old gentlemen with stage T1c adenocarcinoma of the prostate with a Gleason of 7 and a PSA of 9.72.  PROCEDURE: Insertion of radioactive I-125 seeds into the prostate gland.  RADIATION DOSE: 110 Gy boost therapy.  TECHNIQUE: Jeremiah [name] was brought to the operating room with Dr. Risa Grill. He was placed in the dorsolithotomy position. He was catheterized and a rectal tube was inserted. The perineum was shaved, prepped and draped. The ultrasound probe was then introduced into the rectum to see the prostate gland.  TREATMENT DEVICE: A needle grid was attached to the ultrasound probe stand and anchor needles were placed.  COMPLEX ISODOSE CALCULATION: The prostate was imaged in 3D using a sagittal sweep of the prostate probe. These images were transferred to the planning computer. There, the prostate, urethra and rectum were defined on each axial reconstructed image. Then, the software created an optimized plan and a few seed positions were adjusted. Then the accepted plan was uploaded to the seed Selectron afterloading unit.  SPECIAL TREATMENT PROCEDURE/SUPERVISION AND HANDLING: The Nucletron FIRST system was used to place the needles under sagittal guidance. A total of 30 needles were used to deposit 67 seeds in the prostate gland. The individual seed activity was 0.399 mCi for a total implant activity of 26.73 mCi.  COMPLEX SIMULATION: At the end of the procedure, an anterior radiograph of the pelvis was obtained to document seed positioning and count. Cystoscopy was performed to check the urethra and bladder.  MICRODOSIMETRY: At the end of the procedure, the patient was emitting 0.12 mrem/hr at 1 meter. Accordingly, he was considered safe for  hospital discharge.  PLAN: The patient will return to the radiation oncology clinic for post implant CT dosimetry in three weeks.

## 2014-07-01 NOTE — Anesthesia Procedure Notes (Signed)
Procedure Name: Intubation Date/Time: 07/01/2014 8:46 AM Performed by: Mechele Claude Pre-anesthesia Checklist: Patient identified, Emergency Drugs available, Suction available and Patient being monitored Patient Re-evaluated:Patient Re-evaluated prior to inductionOxygen Delivery Method: Circle System Utilized Preoxygenation: Pre-oxygenation with 100% oxygen Intubation Type: IV induction Ventilation: Mask ventilation without difficulty Laryngoscope Size: Mac and 4 Grade View: Grade II Tube type: Oral Tube size: 8.0 mm Number of attempts: 1 Airway Equipment and Method: Stylet and Oral airway Placement Confirmation: ETT inserted through vocal cords under direct vision,  positive ETCO2 and breath sounds checked- equal and bilateral Secured at: 22 cm Tube secured with: Tape Dental Injury: Teeth and Oropharynx as per pre-operative assessment

## 2014-07-01 NOTE — Interval H&P Note (Signed)
History and Physical Interval Note:  07/01/2014 8:33 AM  Jeremiah [name]  has presented today for surgery, with the diagnosis of PROSTATE CANCER  The various methods of treatment have been discussed with the patient and family. After consideration of risks, benefits and other options for treatment, the patient has consented to  Procedure(s) with comments: RADIOACTIVE SEED IMPLANT (N/A) - DR PORTABLE as a surgical intervention .  The patient's history has been reviewed, patient examined, no change in status, stable for surgery.  I have reviewed the patient's chart and labs.  Questions were answered to the patient's satisfaction.     Madison Albea,Hilman S

## 2014-07-01 NOTE — Anesthesia Postprocedure Evaluation (Signed)
  Anesthesia Post-op Note  Patient: Jeremiah [name]  Procedure(s) Performed: Procedure(s) (LRB): RADIOACTIVE SEED IMPLANT (N/A)  Patient Location: PACU  Anesthesia Type: General  Level of Consciousness: awake and alert   Airway and Oxygen Therapy: Patient Spontanous Breathing  Post-op Pain: mild  Post-op Assessment: Post-op Vital signs reviewed, Patient's Cardiovascular Status Stable, Respiratory Function Stable, Patent Airway and No signs of Nausea or vomiting  Last Vitals:  Filed Vitals:   07/01/14 1200  BP: 115/72  Pulse: 99  Temp:   Resp: 17    Post-op Vital Signs: stable   Complications: No apparent anesthesia complications

## 2014-07-01 NOTE — Op Note (Signed)
Preoperative diagnosis: Clinical stage TI C adenocarcinoma the prostate  Postoperative diagnosis: Same  Procedure: I-125 prostate seed implantation(boost) with Nucletron robotic implanter  Surgeon: [name] M.D. , Arloa Koh, M.D.  Anesthesia: Gen.  Indications: Patient  was diagnosed with clinical stage TI C prostate cancer. We had extensive discussion with him about treatment options versus. He elected to proceed with seed implantation. He underwent consultation my office as well as with Dr. Arloa Koh. He appeared to understand the advantages disadvantages potential risks of this treatment option. Full informed consent has been obtained. The patient is had preoperative ciprofloxacin. PAS compression boots were placed.  Technique and findings: Patient was brought the operating room where he had successful induction of general anesthesia. He was placed in lithotomy position and prepped and draped in usual manner. Appropriate surgical timeout was performed. Radiation oncology department placed a transrectal ultrasound probe anchoring stand. Foley catheter with contrast in the balloon was inserted without difficulty. Anchoring needles were placed within the prostate. Real-time contouring of the urethra prostate and rectum were performed and the dosing parameters were established. Targeted dose was 110 Gy. We then came to the operating suite suite for placement of the needles. A second timeout was performed. All needle passage was done with real-time transrectal ultrasound guidance with the sagittal plane. A total of 30 needles were placed. See implantation itself was done with the robotic implanter. 29 active seeds were implanted. A Foley catheter was removed and flexible cystoscopy failed to show any seeds outside the prostate. The Foley catheter was inserted which drained clear urine. The patient was brought to recovery room in stable condition.

## 2014-07-01 NOTE — Discharge Instructions (Addendum)
DISCHARGE INSTRUCTIONS FOR PROSTATE SEED IMPLANTATION  Removal of catheter Remove the foley catheter after 24 hours ( day after the procedure).can be done easily by cutting the side port of the catheter, whichallow the balloon to deflate.  You will see 1-2 teaspoons of clear water as the balloon deflates and then the catheter can be slid out without difficulty.        Cut here  Antibiotics You may be given a prescription for an antibiotic to take when you arrive home. If so, be sure to take every tablet in the bottle, even if you are feeling better before the prescription is finished. If you begin itching, notice a rash or start to swell on your trunk, arms, legs and/or throat, immediately stop taking the antibiotic and call your Urologist. Diet Resume your usual diet when you return home. To keep your bowels moving easily and softly, drink prune, apple and cranberry juice at room temperature. You may also take a stool softener, such as Colace, which is available without prescription at local pharmacies. Daily activities ? No driving or heavy lifting for at least two days after the implant. ? No bike riding, horseback riding or riding lawn mowers for the first month after the implant. ? Any strenuous physical activity should be approved by your doctor before you resume it. Sexual relations You may resume sexual relations two weeks after the procedure. A condom should be used for the first two weeks. Your semen may be dark brown or black; this is normal and is related bleeding that may have occurred during the implant. Postoperative swelling Expect swelling and bruising of the scrotum and perineum (the area between the scrotum and anus). Both the swelling and the bruising should resolve in l or 2 weeks. Ice packs and over- the-counter medications such as Tylenol, Advil or Aleve may lessen your discomfort. Postoperative urination Most men experience burning on urination and/or urinary frequency.  If this becomes bothersome, contact your Urologist.  Medication can be prescribed to relieve these problems.  It is normal to have some blood in your urine for a few days after the implant. Special instructions related to the seeds It is unlikely that you will pass an Iodine-125 seed in your urine. The seeds are silver in color and are about as large as a grain of rice. If you pass a seed, do not handle it with your fingers. Use a spoon to place it in an envelope or jar in place this in base occluded area such as the garage or basement for return to the radiation clinic at your convenience.  Contact your doctor for ? Temperature greater than 101 F ? Increasing pain ? Inability to urinate Follow-up  You should have follow up with your urologist and radiation oncologist about 3 weeks after the procedure. General information regarding Iodine seeds ? Iodine-125 is a low energy radioactive material. It is not deeply penetrating and loses energy at short distances. Your prostate will absorb the radiation. Objects that are touched or used by the patient do not become radioactive. ? Body wastes (urine and stool) or body fluids (saliva, tears, semen or blood) are not radioactive. ? The Nuclear Regulatory Commission [facility]) has determined that no radiation precautions are needed for patients undergoing Iodine-125 seed implantation. The [facility] states that such patients do not present a risk to the people around them, including young children and pregnant women. However, in keeping with the general principle that radiation exposure should be kept as low reasonably  possible, we suggest the following: ? Children and pets should not sit on the patient's lap for the first two (2) weeks after the implant. ? Pregnant (or possibly pregnant) women should avoid prolonged, close contact with the patient for the first two (2) weeks after the implant. ? A distance of three (3) feet is acceptable. ? At a distance of three (3)  feet, there is no limit to the length of time anyone can be with the patient. ? May resume aspirin in 5 days      Post Anesthesia Home Care Instructions  Activity: Get plenty of rest for the remainder of the day. A responsible adult should stay with you for 24 hours following the procedure.  For the next 24 hours, DO NOT: -Drive a car -Paediatric nurse -Drink alcoholic beverages -Take any medication unless instructed by your physician -Make any legal decisions or sign important papers.  Meals: Start with liquid foods such as gelatin or soup. Progress to regular foods as tolerated. Avoid greasy, spicy, heavy foods. If nausea and/or vomiting occur, drink only clear liquids until the nausea and/or vomiting subsides. Call your physician if vomiting continues.  Special Instructions/Symptoms: Your throat may feel dry or sore from the anesthesia or the breathing tube placed in your throat during surgery. If this causes discomfort, gargle with warm salt water. The discomfort should disappear within 24 hours.

## 2014-07-01 NOTE — Transfer of Care (Signed)
  Last Vitals:  Filed Vitals:   07/01/14 0717  BP: 134/82  Pulse: 102  Temp: 37 C  Resp: 16    Immediate Anesthesia Transfer of Care Note  Patient: Jeremiah [name]  Procedure(s) Performed: Procedure(s) (LRB): RADIOACTIVE SEED IMPLANT (N/A)  Patient Location: PACU  Anesthesia Type: General  Level of Consciousness: awake, alert  and oriented  Airway & Oxygen Therapy: Patient Spontanous Breathing and Patient connected to face mask oxygen  Post-op Assessment: Report given to PACU RN and Post -op Vital signs reviewed and stable  Post vital signs: Reviewed and stable  Complications: No apparent anesthesia complications

## 2014-07-01 NOTE — Progress Notes (Signed)
[facility] End of Treatment Note  Name:Jeremiah [name]  Date: 07/01/2014 [id number] DOB:05-Mar-1941   Status:outpatient    CC: [name], MD  Dr. Rana Snare  REFERRING PHYSICIAN:    Dr. 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                        Seed implant boost utilized 67 seeds in 30 active needles with an individual seed activity of 0.399 mCi per seed for a total implant activity of 26.73 mCi.                 NARRATIVE:  The patient appears to have undergone a successful seed implant boost with Dr. Risa Grill.                          PLAN: Follow-up visit in approximately 3 weeks at which time we will obtain a CT scan for his post implant dosimetry.

## 2014-07-02 ENCOUNTER — Encounter (HOSPITAL_BASED_OUTPATIENT_CLINIC_OR_DEPARTMENT_OTHER): Payer: Self-pay | Admitting: Urology

## 2014-07-20 ENCOUNTER — Telehealth: Payer: Self-pay | Admitting: *Deleted

## 2014-07-20 ENCOUNTER — Encounter: Payer: Self-pay | Admitting: Radiation Oncology

## 2014-07-20 NOTE — Telephone Encounter (Signed)
CALLED PATIENT TO REMIND OF APPTS. FOR 07-21-14, SPOKE WITH PATIENT AND HE IS AWARE OF THESE APPTS.

## 2014-07-21 ENCOUNTER — Encounter: Payer: Self-pay | Admitting: Radiation Oncology

## 2014-07-21 ENCOUNTER — Ambulatory Visit
Admit: 2014-07-21 | Discharge: 2014-07-21 | Disposition: A | Discharge status: Home / Self Care | Payer: Medicare Other | Attending: Radiation Oncology | Admitting: Radiation Oncology

## 2014-07-21 ENCOUNTER — Ambulatory Visit
Admission: RE | Admit: 2014-07-21 | Discharge: 2014-07-21 | Disposition: A | Discharge status: Home / Self Care | Payer: Medicare Other | Source: Ambulatory Visit | Attending: Radiation Oncology | Admitting: Radiation Oncology

## 2014-07-21 VITALS — BP 118/80 | HR 84 | Temp 98.1°F | Ht 70.0 in | Wt 184.8 lb

## 2014-07-21 DIAGNOSIS — C61 Malignant neoplasm of prostate: Secondary | ICD-10-CM | POA: Diagnosis not present

## 2014-07-21 DIAGNOSIS — Z510 Encounter for antineoplastic radiation therapy: Secondary | ICD-10-CM | POA: Diagnosis not present

## 2014-07-21 DIAGNOSIS — R351 Nocturia: Secondary | ICD-10-CM | POA: Diagnosis not present

## 2014-07-21 HISTORY — DX: Personal history of irradiation: Z92.3

## 2014-07-21 NOTE — Progress Notes (Signed)
Complex simulation note: The patient was taken to the CT simulator.  His pelvis was scanned.  The CT data set was sent to the MIM system for contouring of his prostate and rectum.  His prostate volume is 45.1 mL while his intraoperative prostate volume by ultrasound was 45.6 mL, a good correlation.  He is now ready for 3-D simulation to assess the quality of his implant.

## 2014-07-21 NOTE — Progress Notes (Signed)
CC: Dr. Rana Snare, Dr. [name]  Follow-up note:  Jeremiah [name] returns today approximately 3 weeks following his prostate seed implant boost in the management of his stage TIc intermediate risk adenocarcinoma prostate.  He is doing recently well from a GU and GI standpoint although he continues to take tamsulosin and Uribel a.  He reports nocturia 3-4.  He believes that he is emptying his bladder.  He will see Jeremiah [name] for a follow-up visit this Friday.  His CT scan today shows what appears to be an excellent seed distribution.  Physical examination: Alert and oriented. Filed Vitals:   07/21/14 1028  BP: 118/80  Pulse: 84  Temp: 98.1 F (36.7 C)   Rectal examination not performed today.  Impression: Satisfactory progress.  We discussed PSA dynamics, and the need for follow-up through Jeremiah [name].  We will move ahead with his post implant dosimetry and forward the results to Jeremiah [name].  Plan: As above.  Follow-up through Jeremiah [name].  I ask that Jeremiah [name] keep me posted on his progress.

## 2014-07-21 NOTE — Progress Notes (Signed)
Jeremiah [name] is here for reassessment s/p radiation therapy for prostate cancer.  He denies any dysuria, but has intermittent frequency, nocturia x 4 on average, urgency when "time" and intermittent dribbling.  He reports that he is now emptying his bladder better. Fatigue waxes and wanes.

## 2014-07-22 ENCOUNTER — Encounter: Payer: Self-pay | Admitting: Radiation Oncology

## 2014-07-22 DIAGNOSIS — Z510 Encounter for antineoplastic radiation therapy: Secondary | ICD-10-CM | POA: Diagnosis not present

## 2014-07-22 NOTE — Progress Notes (Signed)
CC: Dr. Rana Snare, Dr. [name]  Post implant CT dosimetry note: Mr. Jeremiah [name] underwent post-implant CT dosimetry to assess the quality of his seed implant boost.  Dose volume histograms were obtained for the prostate and rectum.  His intraoperative prostate volume by ultrasound was 45.2 mL while his postoperative prostate volume by CT was 45.1 mL, an excellent correlation.  His prostate D 90 is 111.9% and V100 95.1%, both excellent.  Only 0.05 mL of rectum received the prescribed boost dose of 11,000 cGy.  In summary, Mr. Jeremiah [name] has excellent post implant dosimetry with a low risk for late rectal toxicity.

## 2015-06-28 DIAGNOSIS — L821 Other seborrheic keratosis: Secondary | ICD-10-CM | POA: Diagnosis not present

## 2015-06-28 DIAGNOSIS — L570 Actinic keratosis: Secondary | ICD-10-CM | POA: Diagnosis not present

## 2015-06-28 DIAGNOSIS — D1801 Hemangioma of skin and subcutaneous tissue: Secondary | ICD-10-CM | POA: Diagnosis not present

## 2015-06-28 DIAGNOSIS — Z85828 Personal history of other malignant neoplasm of skin: Secondary | ICD-10-CM | POA: Diagnosis not present

## 2015-07-13 DIAGNOSIS — I10 Essential (primary) hypertension: Secondary | ICD-10-CM | POA: Diagnosis not present

## 2015-07-13 DIAGNOSIS — Z125 Encounter for screening for malignant neoplasm of prostate: Secondary | ICD-10-CM | POA: Diagnosis not present

## 2015-07-13 DIAGNOSIS — R7301 Impaired fasting glucose: Secondary | ICD-10-CM | POA: Diagnosis not present

## 2015-07-13 DIAGNOSIS — E784 Other hyperlipidemia: Secondary | ICD-10-CM | POA: Diagnosis not present

## 2015-07-20 DIAGNOSIS — K5790 Diverticulosis of intestine, part unspecified, without perforation or abscess without bleeding: Secondary | ICD-10-CM | POA: Diagnosis not present

## 2015-07-20 DIAGNOSIS — K648 Other hemorrhoids: Secondary | ICD-10-CM | POA: Diagnosis not present

## 2015-07-20 DIAGNOSIS — H3410 Central retinal artery occlusion, unspecified eye: Secondary | ICD-10-CM | POA: Diagnosis not present

## 2015-07-20 DIAGNOSIS — E784 Other hyperlipidemia: Secondary | ICD-10-CM | POA: Diagnosis not present

## 2015-07-20 DIAGNOSIS — M549 Dorsalgia, unspecified: Secondary | ICD-10-CM | POA: Diagnosis not present

## 2015-07-20 DIAGNOSIS — M160 Bilateral primary osteoarthritis of hip: Secondary | ICD-10-CM | POA: Diagnosis not present

## 2015-07-20 DIAGNOSIS — G4709 Other insomnia: Secondary | ICD-10-CM | POA: Diagnosis not present

## 2015-07-20 DIAGNOSIS — S2231XA Fracture of one rib, right side, initial encounter for closed fracture: Secondary | ICD-10-CM | POA: Diagnosis not present

## 2015-07-20 DIAGNOSIS — I10 Essential (primary) hypertension: Secondary | ICD-10-CM | POA: Diagnosis not present

## 2015-07-20 DIAGNOSIS — R7301 Impaired fasting glucose: Secondary | ICD-10-CM | POA: Diagnosis not present

## 2015-07-20 DIAGNOSIS — Z0000 Encounter for general adult medical examination without abnormal findings: Secondary | ICD-10-CM | POA: Diagnosis not present

## 2015-07-22 DIAGNOSIS — Z1212 Encounter for screening for malignant neoplasm of rectum: Secondary | ICD-10-CM | POA: Diagnosis not present

## 2015-07-28 ENCOUNTER — Encounter: Payer: Self-pay | Admitting: Neurology

## 2015-07-28 ENCOUNTER — Ambulatory Visit (INDEPENDENT_AMBULATORY_CARE_PROVIDER_SITE_OTHER): Payer: Medicare Other | Admitting: Neurology

## 2015-07-28 VITALS — BP 112/78 | HR 82 | Resp 18 | Ht 70.0 in | Wt 182.0 lb

## 2015-07-28 DIAGNOSIS — G4719 Other hypersomnia: Secondary | ICD-10-CM

## 2015-07-28 DIAGNOSIS — E663 Overweight: Secondary | ICD-10-CM | POA: Diagnosis not present

## 2015-07-28 DIAGNOSIS — R351 Nocturia: Secondary | ICD-10-CM | POA: Diagnosis not present

## 2015-07-28 NOTE — Progress Notes (Signed)
Subjective:    Patient ID: Jeremiah [name] is a 75 y.o. male.  HPI     Star Age, MD, PhD [facility] [address], [address] [place], [place] [address]  Dear Dr. Joylene Draft,   I saw your patient, Jeremiah [name], upon your kind request in my neurologic clinic today for initial consultation of his sleep disorder, in particular, concern for underlying obstructive sleep apnea. The patient is unaccompanied today. As you know, Mr. Jeremiah [name] is a 75 year old right-handed gentleman with an underlying medical history of prostate cancer, status post radioactive seed implant in February 2016 (s/p XRT prior to that), overweight state, back pain, diverticulosis, hyperlipidemia, history of fall with concussion in 2003, rosacea, osteoarthritis, retinal artery occlusion in December 2013, who reports snoring, excessive daytime somnolence, difficulty with his sleep, including problems initiating and maintaining sleep. He has tried over-the-counter melatonin, infrequently, does not recall the dose. He has had witnessed apneas per wife he states. 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 He worked for [employer] for over 40 years prior to that. His work hours are 11-1/2 hours each day he works. He has been able to manage this quite well he feels bedtime on average is 9 to 9:30 PM and rise time is between 5 and 6 AM. He does feel fairly adequately rested when he first wakes up. Sleep is disrupted primarily because of nocturia which is about 3 times each night on average. She denies restless leg symptoms currently but may have had some symptoms years ago. He had a tonsillectomy. He lives with his wife. He has grown children. He has no pets. He does not watch TV in bed. He drinks alcohol very rarely. He quit smoking in the 60s. He drinks no caffeine on a day-to-day basis. He denies morning headaches. He denies memory issues. Recently he pulled his left hamstring muscles as  he was trying to walk up stairs and missed a step. Also, about a month ago he took a fall as he was getting dressed. He toppled forward. He landed on his right side. He says he has a cracked rib. His Epworth sleepiness score is 11 out of 24 today, his fatigue score is 28 out of 63.  I reviewed your office note from 07/20/2015, which you kindly included.  His Past Medical History Is Significant For: Past Medical History  Diagnosis Date  . Prostate cancer ([place]) 01/2014    Gleason 7, 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  . Diverticulosis   . Hyperlipidemia   . Hypertension   . Osteoarthritis   . Skin cancer     scalp  . Diabetes mellitus without complication (HCC)     diet controlled  . S/P radiation therapy 04/23/13 - 05/29/14    Prostate/seminal vesicles, external beam 4500 cGy in 25 sessions  . BPH (benign prostatic hyperplasia)   . Central retinal artery occlusion     His Past Surgical History Is Significant For: Past Surgical History  Procedure Laterality Date  . Prostate biopsy  2013, 2014, 01/2014    Gleason 7  . Back surgery  2009  . Tonsillectomy    . Hydrocele excision  1963  . Radioactive seed implant N/A 07/01/2014    Procedure: RADIOACTIVE SEED IMPLANT;  Surgeon: [name], MD;  Location: [facility];  Service: Urology;  Laterality: N/A;  DR PORTABLE    His Family History Is Significant For: Family History  Problem Relation Age of Onset  .  Heart attack Father   . Cancer Father     prostate  . Diabetes Father   . Cancer Paternal Uncle     prostate  . Lung cancer Brother     His Social History Is Significant For: Social History   Social History  . Marital Status: Married    Spouse Name: N/A  . Number of Children: N/A  . Years of Education: N/A   Social History Main Topics  . Smoking status: Former Smoker    Quit date: 03/10/1961  . Smokeless tobacco: None  . Alcohol Use: 0.0 oz/week    0 Standard drinks or equivalent per week  . Drug Use: No  . Sexual  Activity: Not Asked   Other Topics Concern  . None   Social History Narrative   Rare caffeine use     His Allergies Are:  No Known Allergies:   His Current Medications Are:  Outpatient Encounter Prescriptions as of 07/28/2015  Medication Sig  . aspirin 325 MG tablet Take 325 mg by mouth daily.  [name] Kitchen atorvastatin (LIPITOR) 20 MG tablet   . Biotin 5000 MCG TABS Take by mouth.  . Cholecalciferol (VITAMIN D3) 1000 units CAPS Take by mouth.  . hydrochlorothiazide (MICROZIDE) 12.5 MG capsule   . Multiple Vitamin (MULTIVITAMIN) capsule Take 1 capsule by mouth daily.  . Multiple Vitamins-Minerals (PRESERVISION AREDS 2 PO) Take by mouth.  . potassium chloride (K-DUR) 10 MEQ tablet   . tamsulosin (FLOMAX) 0.4 MG CAPS capsule Take 0.4 mg by mouth.  . [DISCONTINUED] calcium-vitamin D (OSCAL WITH D) 250-125 MG-UNIT per tablet Take 1 tablet by mouth daily.  . [DISCONTINUED] indapamide (LOZOL) 2.5 MG tablet Take 2.5 mg by mouth daily.  . [DISCONTINUED] Meth-Hyo-M Bl-Na Phos-Ph Sal (URIBEL PO) Take by mouth 2 (two) times daily.  . [DISCONTINUED] potassium chloride (KLOR-CON) 20 MEQ packet Take by mouth 2 (two) times daily.  . [DISCONTINUED] Vitamin D, Ergocalciferol, (DRISDOL) 50000 UNITS CAPS capsule Take 50,000 Units by mouth every 7 (seven) days.   No facility-administered encounter medications on file as of 07/28/2015.  :  Review of Systems:  Out of a complete 14 point review of systems, all are reviewed and negative with the exception of these symptoms as listed below:   Review of Systems  Neurological:       Patient reports that he wakes up several times in the night, hx of snoring, witnessed apnea, some daytime tiredness, falls asleep when sitting still.   Epworth Sleepiness Scale 0= would never doze 1= slight chance of dozing 2= moderate chance of dozing 3= high chance of dozing  Sitting and reading:3 Watching TV:2 Sitting inactive in a public place (ex. Theater or meeting):2 As a  passenger in a car for an hour without a break:1 Lying down to rest in the afternoon:2 Sitting and talking to someone:0 Sitting quietly after lunch (no alcohol):0 In a car, while stopped in traffic:1 Total:11   Objective:  Neurologic Exam  Physical Exam Physical Examination:   Filed Vitals:   07/28/15 1012  BP: 112/78  Pulse: 82  Resp: 18   General Examination: The patient is a very pleasant 75 y.o. male in no acute distress. He appears well-developed and well-nourished and well groomed.   HEENT: Normocephalic, atraumatic, pupils are equal, round and reactive to light and accommodation. Funduscopic exam is normal with sharp disc margins noted. Extraocular tracking is good without limitation to gaze excursion or nystagmus noted. Normal smooth pursuit is noted. Hearing is grossly intact.  Face is symmetric with normal facial animation and normal facial sensation. Speech is clear with no dysarthria noted. There is no hypophonia. There is no lip, neck/head, jaw or voice tremor. Neck is supple with full range of passive and active motion. There are no carotid bruits on auscultation. Oropharynx exam reveals: mild mouth dryness, adequate dental hygiene and moderate airway crowding, due to small airway entry. He has a small mouth opening as well. He has a larger uvula. Tonsils are absent. Tongue is normal in size. Mallampati is class II. Tongue protrudes centrally and palate elevates symmetrically. Neck size is 16 1/8 inches. He has a Mild overbite.    Chest: Clear to auscultation without wheezing, rhonchi or crackles noted.  Heart: S1+S2+0, regular and normal without murmurs, rubs or gallops noted.   Abdomen: Soft, non-tender and non-distended with normal bowel sounds appreciated on auscultation.  Extremities: There is no pitting edema in the distal lower extremities bilaterally. Pedal pulses are intact.  Skin: Quite dry appearing chronic appearing/sun exposure type changes noted. There are  no varicose veins.  Musculoskeletal: exam reveals no obvious joint deformities, tenderness or joint swelling or erythema, with the exception of pain reported in the left hamstrings.   Neurologically:  Mental status: The patient is awake, alert and oriented in all 4 spheres. His immediate and remote memory, attention, language skills and fund of knowledge are appropriate. There is no evidence of aphasia, agnosia, apraxia or anomia. Speech is clear with normal prosody and enunciation. Thought process is linear. Mood is normal and affect is normal.  Cranial nerves II - XII are as described above under HEENT exam. In addition: shoulder shrug is normal with equal shoulder height noted. Motor exam: Normal bulk, strength and tone is noted. There is no drift, tremor or rebound. Romberg is negative. Reflexes are 1-2+ throughout. Babinski: Toes are flexor bilaterally. Fine motor skills and coordination: intact with normal finger taps, normal hand movements, normal rapid alternating patting, normal foot taps and normal foot agility.  Cerebellar testing: No dysmetria or intention tremor on finger to nose testing. Heel to shin is unremarkable bilaterally. There is no truncal or gait ataxia.  Sensory exam: intact to light touch, pinprick, vibration, temperature sense in the upper and lower extremities.  Gait, station and balance: He stands with difficulty. No veering to one side is noted. No leaning to one side is noted. Posture is age-appropriate and stance is narrow based. Gait shows a mild limp on the left. He can slowly and is not able to do tandem walk because of pain in the left hamstrings.   Assessment and Plan:  In summary, CAYDYN FIESER is a very pleasant 75 y.o.-year old male with an underlying medical history of prostate cancer, status post radioactive seed implant in February 2016, overweight state, back pain, diverticulosis, hyperlipidemia, history of fall with concussion in 2003, rosacea,  osteoarthritis, retinal artery occlusion in December 2013, whose history and physical exam are indeed concerning for obstructive sleep apnea (OSA). While he does not give a telltale history, in light of witnessed apneas, and ESS of 13 out of 24, and a moderately crowded airway, I think further evaluation with a sleep study is justified.   I had a long chat with the patient about my findings and the diagnosis of OSA, its prognosis and treatment options. We talked about medical treatments, surgical interventions and non-pharmacological approaches. I explained in particular the risks and ramifications of untreated moderate to severe OSA, especially with respect to developing  cardiovascular disease down the Road, including congestive heart failure, difficult to treat hypertension, cardiac arrhythmias, or stroke. Even type 2 diabetes has, in part, been linked to untreated OSA. Symptoms of untreated OSA include daytime sleepiness, memory problems, mood irritability and mood disorder such as depression and anxiety, lack of energy, as well as recurrent headaches, especially morning headaches. We talked about trying to maintain a healthy lifestyle in general, as well as the importance of weight control. I encouraged the patient to eat healthy, exercise daily and keep well hydrated, to keep a scheduled bedtime and wake time routine, to not skip any meals and eat healthy snacks in between meals. I advised the patient not to drive when feeling sleepy. I recommended the following at this time: sleep study with potential positive airway pressure titration. (We will score hypopneas at 4% and split the sleep study into diagnostic and treatment portion, if the estimated. 2 hour AHI is >15/h).   I explained the sleep test procedure to the patient and also outlined possible surgical and non-surgical treatment options of OSA, including the use of a custom-made dental device (which would require a referral to a specialist dentist or  oral surgeon), upper airway surgical options, such as pillar implants, radiofrequency surgery, tongue base surgery, and UPPP (which would involve a referral to an ENT surgeon). Rarely, jaw surgery such as mandibular advancement may be considered.  I also explained the CPAP treatment option to the patient, who indicated that he would be willing to try CPAP if the need arises. I explained the importance of being compliant with PAP treatment, not only for insurance purposes but primarily to improve His symptoms, and for the patient's long term health benefit, including to reduce His cardiovascular risks. I answered all his questions today and the patient was in agreement. I would like to see him back after the sleep study is completed and encouraged him to call with any interim questions, concerns, problems or updates.   Thank you very much for allowing me to participate in the care of this nice patient. If I can be of any further assistance to you please do not hesitate to call me at [phone].  Sincerely,   Star Age, MD, PhD

## 2015-07-28 NOTE — Patient Instructions (Signed)

## 2015-08-29 ENCOUNTER — Ambulatory Visit (INDEPENDENT_AMBULATORY_CARE_PROVIDER_SITE_OTHER): Payer: Medicare Other | Admitting: Neurology

## 2015-08-29 DIAGNOSIS — G4733 Obstructive sleep apnea (adult) (pediatric): Secondary | ICD-10-CM | POA: Diagnosis not present

## 2015-08-29 DIAGNOSIS — G479 Sleep disorder, unspecified: Secondary | ICD-10-CM

## 2015-08-29 DIAGNOSIS — G4761 Periodic limb movement disorder: Secondary | ICD-10-CM

## 2015-08-29 DIAGNOSIS — G4720 Circadian rhythm sleep disorder, unspecified type: Secondary | ICD-10-CM

## 2015-08-30 NOTE — Sleep Study (Signed)
Please see the scanned sleep study interpretation located in the Procedure tab within the Chart Review section. 

## 2015-09-07 ENCOUNTER — Telehealth: Payer: Self-pay | Admitting: Neurology

## 2015-09-07 DIAGNOSIS — G4733 Obstructive sleep apnea (adult) (pediatric): Secondary | ICD-10-CM

## 2015-09-07 NOTE — Telephone Encounter (Signed)
Diana:  Patient referred by Dr. Joylene Draft, seen by me on 07/28/15, split study on 08/29/15, Ins: MCR. Please call and notify patient that the recent sleep study confirmed the diagnosis of severe OSA. He did fairly with CPAP (not great, little sleep) during the study with improvement noted of the respiratory events. Therefore, I would like start the patient on CPAP therapy at home by prescribing a machine for home use. I placed the order in the chart. The patient will need a follow up appointment with me in 8 to 10 weeks post set up that has to be scheduled; please go ahead and schedule while you have the patient on the phone and make sure patient understands the importance of keeping this window for the FU appointment, as it is often an insurance requirement and failing to adhere to this may result in losing coverage for sleep apnea treatment.  Please re-enforce the importance of compliance with treatment and the need for [place] to monitor compliance data - again an insurance requirement and good feedback for the patient as far as how they are doing.  Also remind patient, that any upcoming CPAP machine or mask issues, should be first addressed with the DME company. Please ask if patient has a preference regarding DME company.  Please arrange for CPAP set up at home through a DME company of patient's choice - once you have spoken to the patient - and faxed/routed report to PCP and referring MD (if other than PCP), you can close this encounter, thanks,   Star Age, MD, PhD [facility] ([place])

## 2015-09-08 NOTE — Telephone Encounter (Signed)
I spoke to patient and he is aware of results and recommendations. He is willing to proceed with treatment. I will send orders to DME company and forward report to PCP. I will send the patient a letter reminding him to make f/u appt and stress the importance of compliance.

## 2015-09-08 NOTE — Telephone Encounter (Signed)
Patient returned Diana's call. Please call [phone].

## 2015-09-08 NOTE — Telephone Encounter (Signed)
LM for patient to call back for results

## 2015-09-14 NOTE — Telephone Encounter (Signed)
I spoke to patient and gave him some more information about sleep apnea, oxygen levels, and basic anatomy. He voiced understanding. He reports that he has not heard from [facility] yet and I said that I will contact Heather with [employer] to get in touch with him.

## 2015-09-14 NOTE — Telephone Encounter (Signed)
Patient is calling back in regard to his sleep study test.  He says you told him his oxygen level is about 70% and should be above 90%. He wants to know why?  Also, you told him the cpap would help him at night bring the oxygen level up...what about during the day?  Please call!

## 2015-09-28 NOTE — Telephone Encounter (Signed)
Patient called back to advise, [employer] called to advise him that CPAP has been approved. Patient states his schedule is such, after mid afternoon today, he won't be available to do this until Saturday afternoon or after.

## 2015-09-28 NOTE — Telephone Encounter (Signed)
I sent another email to [facility] about getting a call back.

## 2015-09-28 NOTE — Telephone Encounter (Signed)
Patient is calling back stating he has not heard from [place] yet. Please call to discuss.

## 2015-09-28 NOTE — Telephone Encounter (Signed)
Jeremiah [name] got back with me and advised me that she just received insurance approval. She has spoken with patient today and has him scheduled for appt on Monday.

## 2015-10-04 DIAGNOSIS — Z8546 Personal history of malignant neoplasm of prostate: Secondary | ICD-10-CM | POA: Diagnosis not present

## 2015-10-04 DIAGNOSIS — G4733 Obstructive sleep apnea (adult) (pediatric): Secondary | ICD-10-CM | POA: Diagnosis not present

## 2015-10-11 DIAGNOSIS — Z8546 Personal history of malignant neoplasm of prostate: Secondary | ICD-10-CM | POA: Diagnosis not present

## 2015-10-11 DIAGNOSIS — Z0000 Encounter for general adult medical examination without abnormal findings: Secondary | ICD-10-CM | POA: Diagnosis not present

## 2015-10-11 DIAGNOSIS — H5203 Hypermetropia, bilateral: Secondary | ICD-10-CM | POA: Diagnosis not present

## 2015-11-03 ENCOUNTER — Telehealth: Payer: Self-pay | Admitting: Neurology

## 2015-11-03 NOTE — Telephone Encounter (Signed)
Patient is calling. He needs to schedule a 1st CPAP follow up appointment. There is an opening tomorrow but the patient cannot come. Please call and discuss.

## 2015-11-03 NOTE — Telephone Encounter (Signed)
I spoke to patient and offered him appt on Monday. He was able to take this appt.

## 2015-11-04 DIAGNOSIS — G4733 Obstructive sleep apnea (adult) (pediatric): Secondary | ICD-10-CM | POA: Diagnosis not present

## 2015-11-08 ENCOUNTER — Encounter: Payer: Self-pay | Admitting: Neurology

## 2015-11-08 ENCOUNTER — Ambulatory Visit (INDEPENDENT_AMBULATORY_CARE_PROVIDER_SITE_OTHER): Payer: Medicare Other | Admitting: Neurology

## 2015-11-08 VITALS — BP 121/73 | HR 79 | Resp 16 | Ht 70.0 in | Wt 184.0 lb

## 2015-11-08 DIAGNOSIS — Z9989 Dependence on other enabling machines and devices: Secondary | ICD-10-CM

## 2015-11-08 DIAGNOSIS — G4733 Obstructive sleep apnea (adult) (pediatric): Secondary | ICD-10-CM | POA: Diagnosis not present

## 2015-11-08 NOTE — Progress Notes (Signed)
Subjective:    Patient ID: Jeremiah [name] is a 75 y.o. male.  HPI     Interim history:   Mr. Jeremiah [name] is a 75 year old right-handed gentleman with an underlying medical history of prostate cancer, status post radioactive seed implant in February 2016 (s/p XRT prior to that), overweight state, back pain, diverticulosis, hyperlipidemia, history of fall with concussion in 2003, rosacea, osteoarthritis, retinal artery occlusion in December 2013, who presents for follow-up consultation of his obstructive sleep apnea, after his recent sleep study. The patient is unaccompanied today. I first met him on 07/28/2015 at the request of his primary care physician, at which time he reported snoring and excessive daytime somnolence as well as difficulty initiating and maintaining sleep. His wife had noticed witnessed apneic pauses. He had a split-night sleep study on 08/29/2015. I went over his test results with him in detail today. Sleep efficiency at baseline was 57.7% with a latency to sleep of 10 minutes and wake after sleep onset of 109.5 minutes with moderate sleep fragmentation noted. He had an increased percentage of light stage sleep, absence of slow-wave sleep and REM sleep was 5.5% with a prolonged REM latency. He had moderate PLMS with mild arousals. He had no significant EKG changes. Mild to moderate snoring was noted. Total AHI was 57.1 per hour. Average oxygen saturation was only 87%, nadir was 73%. He was then titrated on CPAP. Sleep efficiency was 26.4% with a latency to sleep of 126.5 minutes and wake after sleep onset of 49 minutes with moderate sleep fragmentation noted. He had slow-wave sleep at 15.9%, and REM sleep at 20.6%. Average oxygen saturation was 91%, nadir was 86%. He had severe PLMS with minimal arousals. Based on his test results are prescribed CPAP therapy for home use.  Today, 11/08/2015: I reviewed his CPAP compliance data from 10/05/2015 through 11/03/2015 which is a total of 30  days during which time he used his machine 29 days with percent used days greater than 4 hours at 40%, indicating suboptimal compliance with an average usage of 3 hours and 33 minutes for all nights. Residual AHI suboptimal at 10 per hour, leak at times borderline for the 95th percentile at 23.8 L/m on a pressure of 9 cm with EPR of 3.  Today, 11/08/2015: He reports doing fairly well. He has adapted to CPAP therapy reasonably well. He feels somewhat better rested and less tired during the day, nocturia is better as well. Recently in the past 2 weeks or so he started using nasal pillows which he likes better than the full facemask that we originally used. He is aware of some leg restlessness and restless legs type symptoms but this is not a significant problem and has actually improved recently.   Previously:   07/28/2015: He reports snoring, excessive daytime somnolence, difficulty with his sleep, including problems initiating and maintaining sleep. He has tried over-the-counter melatonin, infrequently, does not recall the dose. He has had witnessed apneas per wife he states. 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 He worked for [employer] for over 40 years prior to that. His work hours are 11-1/2 hours each day he works. He has been able to manage this quite well he feels bedtime on average is 9 to 9:30 PM and rise time is between 5 and 6 AM. He does feel fairly adequately rested when he first wakes up. Sleep is disrupted primarily because of nocturia which is about 3 times each night on average. He denies  restless leg symptoms currently but may have had some symptoms years ago. He had a tonsillectomy. He lives with his wife. He has grown children. He has no pets. He does not watch TV in bed. He drinks alcohol very rarely. He quit smoking in the 60s. He drinks no caffeine on a day-to-day basis. He denies morning headaches. He denies memory issues. Recently he pulled his left hamstring  muscles as he was trying to walk up stairs and missed a step. Also, about a month ago he took a fall as he was getting dressed. He toppled forward. He landed on his right side. He says he has a cracked rib. His Epworth sleepiness score is 11 out of 24 today, his fatigue score is 28 out of 63.  I reviewed your office note from 07/20/2015, which you kindly included.  His Past Medical History Is Significant For: Past Medical History  Diagnosis Date  . Prostate cancer ([place]) 01/2014    Gleason 7, 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  . Diverticulosis   . Hyperlipidemia   . Hypertension   . Osteoarthritis   . Skin cancer     scalp  . Diabetes mellitus without complication ([facility])     diet controlled  . S/P radiation therapy 04/23/13 - 05/29/14    Prostate/seminal vesicles, external beam 4500 cGy in 25 sessions  . BPH (benign prostatic hyperplasia)   . Central retinal artery occlusion     His Past Surgical History Is Significant For: Past Surgical History  Procedure Laterality Date  . Prostate biopsy  2013, 2014, 01/2014    Gleason 7  . Back surgery  2009  . Tonsillectomy    . Hydrocele excision  1963  . Radioactive seed implant N/A 07/01/2014    Procedure: RADIOACTIVE SEED IMPLANT;  Surgeon: [name], MD;  Location: [facility];  Service: Urology;  Laterality: N/A;  DR PORTABLE    His Family History Is Significant For: Family History  Problem Relation Age of Onset  . Heart attack Father   . Cancer Father     prostate  . Diabetes Father   . Cancer Paternal Uncle     prostate  . Lung cancer Brother     His Social History Is Significant For: Social History   Social History  . Marital Status: Married    Spouse Name: N/A  . Number of Children: N/A  . Years of Education: N/A   Social History Main Topics  . Smoking status: Former Smoker    Quit date: 03/10/1961  . Smokeless tobacco: None  . Alcohol Use: 0.0 oz/week    0 Standard drinks or equivalent per week  . Drug Use: No  .  Sexual Activity: Not Asked   Other Topics Concern  . None   Social History Narrative   Rare caffeine use     His Allergies Are:  No Known Allergies:   His Current Medications Are:  Outpatient Encounter Prescriptions as of 11/08/2015  Medication Sig  . aspirin 325 MG tablet Take 325 mg by mouth daily.  [name] Kitchen atorvastatin (LIPITOR) 20 MG tablet   . Biotin 5000 MCG TABS Take by mouth.  . Cholecalciferol (VITAMIN D3) 1000 units CAPS Take by mouth.  . hydrochlorothiazide (MICROZIDE) 12.5 MG capsule   . Mirabegron (MYRBETRIQ PO) Take by mouth.  . Multiple Vitamin (MULTIVITAMIN) capsule Take 1 capsule by mouth daily.  . Multiple Vitamins-Minerals (PRESERVISION AREDS 2 PO) Take by mouth.  . potassium chloride (K-DUR) 10 MEQ tablet   .  tamsulosin (FLOMAX) 0.4 MG CAPS capsule Take 0.4 mg by mouth.   No facility-administered encounter medications on file as of 11/08/2015.  :  Review of Systems:  Out of a complete 14 point review of systems, all are reviewed and negative with the exception of these symptoms as listed below:   Review of Systems  Neurological:       Patient is here for CPAP f/u. States that he is doing well. Recently got a new mask and feels that it is working better.     Objective:  Neurologic Exam  Physical Exam Physical Examination:   Filed Vitals:   11/08/15 0831  BP: 121/73  Pulse: 79  Resp: 16    General Examination: The patient is a very pleasant 75 y.o. male in no acute distress. He appears well-developed and well-nourished and well groomed. He is in good spirits today.  HEENT: Normocephalic, atraumatic, pupils are equal, round and reactive to light and accommodation. Extraocular tracking is good without limitation to gaze excursion or nystagmus noted. Normal smooth pursuit is noted. Hearing is grossly intact. Face is symmetric with normal facial animation and normal facial sensation. Speech is clear with no dysarthria noted. There is no hypophonia. There is  no lip, neck/head, jaw or voice tremor. Neck is supple with full range of passive and active motion. There are no carotid bruits on auscultation. Oropharynx exam reveals: mild mouth dryness, adequate dental hygiene and moderate airway crowding, due to small airway entry. He has a small mouth opening as well. He has a larger uvula. Tonsils are absent. Tongue is normal in size. Mallampati is class II. Tongue protrudes centrally and palate elevates symmetrically.   Chest: Clear to auscultation without wheezing, rhonchi or crackles noted.  Heart: S1+S2+0, regular and normal without murmurs, rubs or gallops noted.   Abdomen: Soft, non-tender and non-distended with normal bowel sounds appreciated on auscultation.  Extremities: There is no pitting edema in the distal lower extremities bilaterally. Pedal pulses are intact.  Skin: Quite dry appearing chronic appearing/sun exposure type changes noted. There are no varicose veins.  Musculoskeletal: exam reveals no obvious joint deformities, tenderness or joint swelling or erythema, with the exception of pain reported in the left hamstrings.   Neurologically:  Mental status: The patient is awake, alert and oriented in all 4 spheres. His immediate and remote memory, attention, language skills and fund of knowledge are appropriate. There is no evidence of aphasia, agnosia, apraxia or anomia. Speech is clear with normal prosody and enunciation. Thought process is linear. Mood is normal and affect is normal.  Cranial nerves II - XII are as described above under HEENT exam. In addition: shoulder shrug is normal with equal shoulder height noted. Motor exam: Normal bulk, strength and tone is noted. There is no drift, tremor or rebound. Romberg is negative. Reflexes are 1-2+ throughout. fine motor skills and coordination: intact with normal finger taps, normal hand movements, normal rapid alternating patting, normal foot taps and normal foot agility.  Cerebellar  testing: No dysmetria or intention tremor on finger to nose testing. There is no truncal or gait ataxia.  Sensory exam: intact to light touch in the upper and lower extremities.  Gait, station and balance: He stands with difficulty. No veering to one side is noted. No leaning to one side is noted. Posture is age-appropriate and stance is narrow based, but he has a slight tilt of the upper body to the right. Tandem walk is difficult for him.    Assessment  and Plan:  In summary, ASANI MCBURNEY is a very pleasant 75 year old male with an underlying medical history of prostate cancer, status post radioactive seed implant in February 2016, overweight state, back pain, diverticulosis, hyperlipidemia, history of fall with concussion in 2003, rosacea, osteoarthritis, retinal artery occlusion in December 2013, whoReturns for follow-up consultation of his obstructive sleep apnea, after his split-night sleep study in April 2017. 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 We talked about his test results in detail today. Also reviewed the compliance data findings with him. We will monitor symptoms for restless legs, this is currently not a big player and leg twitching may have improved he feels. He has established CPAP therapy, now via nasal pillows which he prefers. In the last couple weeks his usage has increased and his AHI is better as well. Overall, he still has suboptimal compliance and AHI is also suboptimal at 10 per hour. We mutually agreed to keep him on the nasal pillows. He is encouraged to stay on CPAP regularly and I would like to increase his pressure at this time to 11 cm. He is in agreement. He has a stable physical exam. He is commended for his compliance with CPAP. He is willing to continue with treatment and indicates improvement in his daytime somnolence and sleep disruption, particularly also his nocturia. I would like to see him back in 3 months, sooner  as needed, and I answered all his questions today.  I spent 25 minutes in total face-to-face time with the patient, more than 50% of which was spent in counseling and coordination of care, reviewing test results, reviewing medication and discussing or reviewing the diagnosis of OSA, its prognosis and treatment options.

## 2015-11-08 NOTE — Patient Instructions (Addendum)
Please continue using your CPAP regularly. While your insurance requires that you use CPAP at least 4 hours each night on 70% of the nights, I recommend, that you not skip any nights and use it throughout the night if you can. Getting used to CPAP and staying with the treatment long term does take time and patience and discipline. Untreated obstructive sleep apnea when it is moderate to severe can have an adverse impact on cardiovascular health and raise her risk for heart disease, arrhythmias, hypertension, congestive heart failure, stroke and diabetes. Untreated obstructive sleep apnea causes sleep disruption, nonrestorative sleep, and sleep deprivation. This can have an impact on your day to day functioning and cause daytime sleepiness and impairment of cognitive function, memory loss, mood disturbance, and problems focussing. Using CPAP regularly can improve these symptoms.  Keep up the good work! I will see you back in 3 months for sleep apnea check up, and we are increasing your pressure at this time to 11 cm.

## 2015-12-04 DIAGNOSIS — G4733 Obstructive sleep apnea (adult) (pediatric): Secondary | ICD-10-CM | POA: Diagnosis not present

## 2016-02-04 DIAGNOSIS — G4733 Obstructive sleep apnea (adult) (pediatric): Secondary | ICD-10-CM | POA: Diagnosis not present

## 2016-02-08 ENCOUNTER — Encounter: Payer: Self-pay | Admitting: Neurology

## 2016-02-08 ENCOUNTER — Ambulatory Visit: Payer: Medicare Other | Admitting: Neurology

## 2016-02-08 ENCOUNTER — Ambulatory Visit (INDEPENDENT_AMBULATORY_CARE_PROVIDER_SITE_OTHER): Payer: Medicare Other | Admitting: Neurology

## 2016-02-08 VITALS — BP 130/78 | HR 78 | Ht 70.0 in | Wt 188.0 lb

## 2016-02-08 DIAGNOSIS — Z9989 Dependence on other enabling machines and devices: Secondary | ICD-10-CM

## 2016-02-08 DIAGNOSIS — G4733 Obstructive sleep apnea (adult) (pediatric): Secondary | ICD-10-CM | POA: Diagnosis not present

## 2016-02-08 DIAGNOSIS — E663 Overweight: Secondary | ICD-10-CM | POA: Diagnosis not present

## 2016-02-08 NOTE — Patient Instructions (Addendum)
Please continue using your CPAP regularly. While your insurance requires that you use CPAP at least 4 hours each night on 70% of the nights, I recommend, that you not skip any nights and use it throughout the night if you can. Getting used to CPAP and staying with the treatment long term does take time and patience and discipline. Untreated obstructive sleep apnea when it is moderate to severe can have an adverse impact on cardiovascular health and raise her risk for heart disease, arrhythmias, hypertension, congestive heart failure, stroke and diabetes. Untreated obstructive sleep apnea causes sleep disruption, nonrestorative sleep, and sleep deprivation. This can have an impact on your day to day functioning and cause daytime sleepiness and impairment of cognitive function, memory loss, mood disturbance, and problems focussing. Using CPAP regularly can improve these symptoms.   You can try Melatonin again at night for sleep: take 1 mg to 3 mg, you can take at bedtime, can go up to 5 mg if needed. It is over the counter and comes in pill form, chewable form and spray, if you prefer.     We will continue to work on this with you, we can see you in 3 months, and you can see one of our nurse practitioners. I will see you after that.

## 2016-02-08 NOTE — Progress Notes (Signed)
Subjective:    Patient ID: Jeremiah [name] is a 75 y.o. male.  HPI     Interim history:   Jeremiah [name] is a 75 year old right-handed gentleman with an underlying medical history of prostate cancer, status post radioactive seed implant in February 2016 (s/p XRT prior to that), overweight state, back pain, diverticulosis, hyperlipidemia, history of fall with concussion in 2003, rosacea, osteoarthritis, retinal artery occlusion in December 2013, who presents for follow-up consultation of his obstructive sleep apnea, on CPAP therapy. The patient is unaccompanied today. I last saw him on 11/08/2015, at which time he reported fairly well. He felt he was adapting to CPAP therapy reasonably well and felt somewhat better rested and less tired during the day, nocturia had improved as well. He preferred the nasal pillows which he started using recently. Restless leg symptoms were infrequent and not very bothersome. In fact, he felt that his leg movements had improved since CPAP therapy. We talked about his split-night sleep study results from 08/29/2015 and his compliance data. He was not fully compliant with CPAP therapy. Residual AHI was 10 per hour. Leak was borderline.   Today, 02/08/2016: I reviewed his CPAP compliance data from 01/08/2016 through 02/06/2016 which is a total of 30 days during which time he used his machine every night with percent used days greater than 4 hours at 50%, indicating suboptimal compliance, average usage of 3 hours at 69 minutes, residual AHI borderline at 5.1 per hour, leak acceptable with the 95th percentile at 13.8 L/m at a pressure of 9 cm with EPR of 3.   Today, 02/08/2016: He reports that using CPAP is still an adjustment, ongoing challenge. 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   Previously:   I first met him on 07/28/2015 at the request of his primary care physician, at which time he reported  snoring and excessive daytime somnolence as well as difficulty initiating and maintaining sleep. His wife had noticed witnessed apneic pauses. He had a split-night sleep study on 08/29/2015. Sleep efficiency at baseline was 57.7% with a latency to sleep of 10 minutes and wake after sleep onset of 109.5 minutes with moderate sleep fragmentation noted. He had an increased percentage of light stage sleep, absence of slow-wave sleep and REM sleep was 5.5% with a prolonged REM latency. He had moderate PLMS with mild arousals. He had no significant EKG changes. Mild to moderate snoring was noted. Total AHI was 57.1 per hour. Average oxygen saturation was only 87%, nadir was 73%. He was then titrated on CPAP. Sleep efficiency was 26.4% with a latency to sleep of 126.5 minutes and wake after sleep onset of 49 minutes with moderate sleep fragmentation noted. He had slow-wave sleep at 15.9%, and REM sleep at 20.6%. Average oxygen saturation was 91%, nadir was 86%. He had severe PLMS with minimal arousals. Based on his test results are prescribed CPAP therapy for home use.   I reviewed his CPAP compliance data from 10/05/2015 through 11/03/2015 which is a total of 30 days during which time he used his machine 29 days with percent used days greater than 4 hours at 40%, indicating suboptimal compliance with an average usage of 3 hours and 33 minutes for all nights. Residual AHI suboptimal at 10 per hour, leak at times borderline for the 95th percentile at 23.8 L/m on a pressure of 9 cm with EPR of 3.    07/28/2015:  He reports snoring, excessive daytime somnolence, difficulty with his sleep, including problems initiating and maintaining sleep. He has tried over-the-counter melatonin, infrequently, does not recall the dose. He has had witnessed apneas per wife he states. 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 He worked for [employer] for over 40 years prior to that. His work hours are 11-1/2 hours each  day he works. He has been able to manage this quite well he feels bedtime on average is 9 to 9:30 PM and rise time is between 5 and 6 AM. He does feel fairly adequately rested when he first wakes up. Sleep is disrupted primarily because of nocturia which is about 3 times each night on average. He denies restless leg symptoms currently but may have had some symptoms years ago. He had a tonsillectomy. He lives with his wife. He has grown children. He has no pets. He does not watch TV in bed. He drinks alcohol very rarely. He quit smoking in the 60s. He drinks no caffeine on a day-to-day basis. He denies morning headaches. He denies memory issues. Recently he pulled his left hamstring muscles as he was trying to walk up stairs and missed a step. Also, about a month ago he took a fall as he was getting dressed. He toppled forward. He landed on his right side. He says he has a cracked rib. His Epworth sleepiness score is 11 out of 24 today, his fatigue score is 28 out of 63.  I reviewed your office note from 07/20/2015, which you kindly included.  His Past Medical History Is Significant For: Past Medical History:  Diagnosis Date  . BPH (benign prostatic hyperplasia)   . Central retinal artery occlusion   . Diabetes mellitus without complication (HCC)    diet controlled  . Diverticulosis   . Hyperlipidemia   . Hypertension   . Osteoarthritis   . 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 S/P radiation therapy 04/23/13 - 05/29/14   Prostate/seminal vesicles, external beam 4500 cGy in 25 sessions  . Skin cancer    scalp    His Past Surgical History Is Significant For: Past Surgical History:  Procedure Laterality Date  . BACK SURGERY  2009  . [place]  . PROSTATE BIOPSY  2013, 2014, 01/2014   Gleason 7  . RADIOACTIVE SEED IMPLANT N/A 07/01/2014   Procedure: RADIOACTIVE SEED IMPLANT;  Surgeon: [name], MD;  Location: [facility];  Service: Urology;   Laterality: N/A;  DR PORTABLE  . TONSILLECTOMY      His Family History Is Significant For: Family History  Problem Relation Age of Onset  . Heart attack Father   . Cancer Father     prostate  . Diabetes Father   . Cancer Paternal Uncle     prostate  . Lung cancer Brother     His Social History Is Significant For: Social History   Social History  . Marital status: Married    Spouse name: N/A  . Number of children: N/A  . Years of education: N/A   Social History Main Topics  . Smoking status: Former Smoker    Quit date: 03/10/1961  . Smokeless tobacco: None  . Alcohol use 0.0 oz/week  . Drug use: No  . Sexual activity: Not Asked   Other Topics Concern  . None   Social History Narrative   Rare caffeine use     His Allergies  Are:  No Known Allergies:   His Current Medications Are:  Outpatient Encounter Prescriptions as of 02/08/2016  Medication Sig  . aspirin 325 MG tablet Take 325 mg by mouth daily.  [name] Kitchen atorvastatin (LIPITOR) 20 MG tablet   . Biotin 5000 MCG TABS Take by mouth.  . Cholecalciferol (VITAMIN D3) 1000 units CAPS Take by mouth.  . hydrochlorothiazide (MICROZIDE) 12.5 MG capsule   . Mirabegron (MYRBETRIQ PO) Take by mouth.  . Multiple Vitamins-Minerals (ICAPS AREDS 2 PO) Take by mouth.  . Multiple Vitamins-Minerals (PRESERVISION AREDS 2 PO) Take by mouth.  . potassium chloride (K-DUR) 10 MEQ tablet   . tamsulosin (FLOMAX) 0.4 MG CAPS capsule Take 0.4 mg by mouth.  . [DISCONTINUED] Multiple Vitamin (MULTIVITAMIN) capsule Take 1 capsule by mouth daily.   No facility-administered encounter medications on file as of 02/08/2016.   :  Review of Systems:  Out of a complete 14 point review of systems, all are reviewed and negative with the exception of these symptoms as listed below:  Review of Systems  Neurological:       No new concerns per patient. States that using CPAP is still "an adjustment".     Objective:  Neurologic Exam  Physical  Exam Physical Examination:   Vitals:   02/08/16 0824  BP: 130/78  Pulse: 78    General Examination: The patient is a very pleasant 75 y.o. male in no acute distress. He appears well-developed and well-nourished and well groomed. He is in good spirits today.  HEENT: Normocephalic, atraumatic, pupils are equal, round and reactive to light and accommodation. Extraocular tracking is good without limitation to gaze excursion or nystagmus noted. Normal smooth pursuit is noted. Hearing is grossly intact. Face is symmetric with normal facial animation and normal facial sensation. Speech is clear with no dysarthria noted. There is no hypophonia. There is no lip, neck/head, jaw or voice tremor. Neck is supple with full range of passive and active motion. There are no carotid bruits on auscultation. Oropharynx exam reveals: mild mouth dryness, adequate dental hygiene and moderate airway crowding, due to small airway entry. He has a small mouth opening as well. He has a larger uvula. Tonsils are absent. Tongue is normal in size. Mallampati is class II. Tongue protrudes centrally and palate elevates symmetrically.   Chest: Clear to auscultation without wheezing, rhonchi or crackles noted.  Heart: S1+S2+0, regular and normal without murmurs, rubs or gallops noted.   Abdomen: Soft, non-tender and non-distended with normal bowel sounds appreciated on auscultation.  Extremities: There is no pitting edema in the distal lower extremities bilaterally. Pedal pulses are intact.  Skin: Quite dry appearing chronic appearing/sun exposure type changes noted. Mild erythema and swelling R outer ankle, recent wasp sting.  Musculoskeletal: exam reveals no obvious joint deformities, tenderness or joint swelling or erythema.  Broke his L ankle as a teenager.   Neurologically:  Mental status: The patient is awake, alert and oriented in all 4 spheres. His immediate and remote memory, attention, language skills and fund of  knowledge are appropriate. There is no evidence of aphasia, agnosia, apraxia or anomia. Speech is clear with normal prosody and enunciation. Thought process is linear. Mood is normal and affect is normal.  Cranial nerves II - XII are as described above under HEENT exam. In addition: shoulder shrug is normal with equal shoulder height noted. Motor exam: Normal bulk, strength and tone is noted. There is no drift, tremor or rebound. Romberg is negative. Reflexes are 1-2+ throughout.  fine motor skills and coordination: intact with normal finger taps, normal hand movements, normal rapid alternating patting, normal foot taps and normal foot agility.  Cerebellar testing: No dysmetria or intention tremor on finger to nose testing. There is no truncal or gait ataxia.  Sensory exam: intact to light touch in the upper and lower extremities.  Gait, station and balance: He stands with mild difficulty. No veering to one side is noted. No leaning to one side is noted. Posture is slightly tilted forward and to the right. L food turns outward while walking, no limp.   Assessment and Plan:  In summary, Jeremiah [name] is a very pleasant 75 year old male with an underlying medical history of prostate cancer, status post radioactive seed implant in February 2016, overweight state, back pain, diverticulosis, hyperlipidemia, history of fall with concussion in 2003, rosacea, osteoarthritis, retinal artery occlusion in December 2013, who returns for follow-up consultation of his obstructive sleep apnea, on treatment with CPAP with better compliance (40%, now at 50%).His residual AHI also improved from 10 per hour to currently 5.1 per hour. He has been on 9 cm of pressure, tolerates the nasal pillows. Physical exam is stable. He does not endorse much in the way of restless leg symptoms, leg twitching may have improved after she started CPAP therapy. He is commended for continuing to use CPAP. His compliance has indeed improved and  he is encouraged to continue with CPAP therapy. He has some sleep maintenance issues. I suggested no prescription sleeping pill at this time, suggested he try melatonin at bedtime. He can try 1-3 mg, up to 5 mg at bedtime. I suggested a three-month checkup with one of her nurse practitioners and I will see him back after that. I answered all his questions today and he was in agreement.  I spent 25 minutes in total face-to-face time with the patient, more than 50% of which was spent in counseling and coordination of care, reviewing test results, reviewing medication and discussing or reviewing the diagnosis of OSA, its prognosis and treatment options.

## 2016-02-16 ENCOUNTER — Telehealth: Payer: Self-pay | Admitting: Neurology

## 2016-02-16 NOTE — Telephone Encounter (Signed)
Patient called regarding old CPAP equipment and what to do with it, when he goes on website for his air it advises him to contact his provider. Please call to advise [phone].

## 2016-02-17 NOTE — Telephone Encounter (Signed)
I called patient back and he is aware of what Heather advised me. He will only be responsible for returning the machine, bag, and electric cord.

## 2016-02-17 NOTE — Telephone Encounter (Signed)
I called patient back. He wants to make sure that it is ok to dispose his old equipment. I advised him that I will verify with Nira Conn just to make sure. Email sent to [facility].

## 2016-02-19 DIAGNOSIS — S80219A Abrasion, unspecified knee, initial encounter: Secondary | ICD-10-CM | POA: Diagnosis not present

## 2016-02-19 DIAGNOSIS — S0993XA Unspecified injury of face, initial encounter: Secondary | ICD-10-CM | POA: Diagnosis not present

## 2016-02-19 DIAGNOSIS — S80211A Abrasion, right knee, initial encounter: Secondary | ICD-10-CM | POA: Diagnosis not present

## 2016-02-19 DIAGNOSIS — I10 Essential (primary) hypertension: Secondary | ICD-10-CM | POA: Diagnosis not present

## 2016-02-19 DIAGNOSIS — R220 Localized swelling, mass and lump, head: Secondary | ICD-10-CM | POA: Diagnosis not present

## 2016-02-19 DIAGNOSIS — S0081XA Abrasion of other part of head, initial encounter: Secondary | ICD-10-CM | POA: Diagnosis not present

## 2016-02-19 DIAGNOSIS — S0083XA Contusion of other part of head, initial encounter: Secondary | ICD-10-CM | POA: Diagnosis not present

## 2016-02-19 DIAGNOSIS — Z23 Encounter for immunization: Secondary | ICD-10-CM | POA: Diagnosis not present

## 2016-02-19 DIAGNOSIS — M25569 Pain in unspecified knee: Secondary | ICD-10-CM | POA: Diagnosis not present

## 2016-02-19 DIAGNOSIS — Z8546 Personal history of malignant neoplasm of prostate: Secondary | ICD-10-CM | POA: Diagnosis not present

## 2016-02-19 DIAGNOSIS — S80212A Abrasion, left knee, initial encounter: Secondary | ICD-10-CM | POA: Diagnosis not present

## 2016-02-19 DIAGNOSIS — S00202A Unspecified superficial injury of left eyelid and periocular area, initial encounter: Secondary | ICD-10-CM | POA: Diagnosis not present

## 2016-02-19 DIAGNOSIS — Z7982 Long term (current) use of aspirin: Secondary | ICD-10-CM | POA: Diagnosis not present

## 2016-04-10 DIAGNOSIS — Z8546 Personal history of malignant neoplasm of prostate: Secondary | ICD-10-CM | POA: Diagnosis not present

## 2016-04-17 DIAGNOSIS — N3281 Overactive bladder: Secondary | ICD-10-CM | POA: Diagnosis not present

## 2016-04-17 DIAGNOSIS — Z8546 Personal history of malignant neoplasm of prostate: Secondary | ICD-10-CM | POA: Diagnosis not present

## 2016-05-09 ENCOUNTER — Ambulatory Visit (INDEPENDENT_AMBULATORY_CARE_PROVIDER_SITE_OTHER): Payer: Medicare Other | Admitting: Adult Health

## 2016-05-09 ENCOUNTER — Encounter: Payer: Self-pay | Admitting: Adult Health

## 2016-05-09 VITALS — BP 127/82 | HR 77 | Wt 194.6 lb

## 2016-05-09 DIAGNOSIS — Z9989 Dependence on other enabling machines and devices: Secondary | ICD-10-CM | POA: Diagnosis not present

## 2016-05-09 DIAGNOSIS — G4733 Obstructive sleep apnea (adult) (pediatric): Secondary | ICD-10-CM

## 2016-05-09 NOTE — Patient Instructions (Signed)
Robin from the sleep lab will call to have you come in. You will need to bring your machine with you

## 2016-05-09 NOTE — Progress Notes (Addendum)
PATIENT: Jeremiah [name] DOB: 14-Aug-1940  REASON FOR VISIT: follow up- OSA on CPAP HISTORY FROM: patient  HISTORY OF PRESENT ILLNESS: Today 05/09/2016: Jeremiah [name] is a 75 year old male with a history of obstructive sleep apnea on CPAP. He returns today for a compliance download. His download indicates that he tried to use his machine 25 out of 30 days but unfortunately he only used the machine for 20-30 minutes each night. He states that when he changed out his supplies for some reason he could not use the machine. He feels that this is psychological. He states that when he puts the mask on he feels that he cannot seem to sync his breathing with the machine and ultimately takes off the mask. He states that the supplies he received are the same that he was currently using. He is using the full face mask. Previous downloads have shown excellent compliance. He returns today for an evaluation.  HISTORY 02/08/16: Jeremiah [name] is a 75 year old right-handed gentleman with an underlying medical history of prostate cancer, status post radioactive seed implant in February 2016 (s/p XRT prior to that), overweight state, back pain, diverticulosis, hyperlipidemia, history of fall with concussion in 2003, rosacea, osteoarthritis, retinal artery occlusion in December 2013, who presents for follow-up consultation of his obstructive sleep apnea, on CPAP therapy. The patient is unaccompanied today. I last saw him on 11/08/2015, at which time he reported fairly well. He felt he was adapting to CPAP therapy reasonably well and felt somewhat better rested and less tired during the day, nocturia had improved as well. He preferred the nasal pillows which he started using recently. Restless leg symptoms were infrequent and not very bothersome. In fact, he felt that his leg movements had improved since CPAP therapy. We talked about his split-night sleep study results from 08/29/2015 and his compliance data. He was not fully  compliant with CPAP therapy. Residual AHI was 10 per hour. Leak was borderline.   Today, 02/08/2016: I reviewed his CPAP compliance data from 01/08/2016 through 02/06/2016 which is a total of 30 days during which time he used his machine every night with percent used days greater than 4 hours at 50%, indicating suboptimal compliance, average usage of 3 hours at 69 minutes, residual AHI borderline at 5.1 per hour, leak acceptable with the 95th percentile at 13.8 L/m at a pressure of 9 cm with EPR of 3.   Today, 02/08/2016: He reports that using CPAP is still an adjustment, ongoing challenge. 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   Previously:   I first met him on 07/28/2015 at the request of his primary care physician, at which time he reported snoring and excessive daytime somnolence as well as difficulty initiating and maintaining sleep. His wife had noticed witnessed apneic pauses. He had a split-night sleep study on 08/29/2015. Sleep efficiency at baseline was 57.7% with a latency to sleep of 10 minutes and wake after sleep onset of 109.5 minutes with moderate sleep fragmentation noted. He had an increased percentage of light stage sleep, absence of slow-wave sleep and REM sleep was 5.5% with a prolonged REM latency. He had moderate PLMS with mild arousals. He had no significant EKG changes. Mild to moderate snoring was noted. Total AHI was 57.1 per hour. Average oxygen saturation was only 87%, nadir was 73%. He was then titrated on CPAP. Sleep efficiency was 26.4% with a latency to  sleep of 126.5 minutes and wake after sleep onset of 49 minutes with moderate sleep fragmentation noted. He had slow-wave sleep at 15.9%, and REM sleep at 20.6%. Average oxygen saturation was 91%, nadir was 86%. He had severe PLMS with minimal arousals. Based on his test results are prescribed CPAP therapy for home use.  I reviewed his CPAP  compliance data from 10/05/2015 through 11/03/2015 which is a total of 30 days during which time he used his machine 29 days with percent used days greater than 4 hours at 40%, indicating suboptimal compliance with an average usage of 3 hours and 33 minutes for all nights. Residual AHI suboptimal at 10 per hour, leak at times borderline for the 95th percentile at 23.8 L/m on a pressure of 9 cm with EPR of 3.  07/28/2015: He reports snoring, excessive daytime somnolence, difficulty with his sleep, including problems initiating and maintaining sleep. He has tried over-the-counter melatonin, infrequently, does not recall the dose. He has had witnessed apneas per wife he states. 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 He worked for [employer] for over 40 years prior to that. His work hours are 11-1/2 hours each day he works. He has been able to manage this quite well he feels bedtime on average is 9 to 9:30 PM and rise time is between 5 and 6 AM. He does feel fairly adequately rested when he first wakes up. Sleep is disrupted primarily because of nocturia which is about 3 times each night on average. He denies restless leg symptoms currently but may have had some symptoms years ago. He had a tonsillectomy. He lives with his wife. He has grown children. He has no pets. He does not watch TV in bed. He drinks alcohol very rarely. He quit smoking in the 60s. He drinks no caffeine on a day-to-day basis. He denies morning headaches. He denies memory issues. Recently he pulled his left hamstring muscles as he was trying to walk up stairs and missed a step. Also, about a month ago he took a fall as he was getting dressed. He toppled forward. He landed on his right side. He says he has a cracked rib. His Epworth sleepiness score is 11 out of 24 today, his fatigue score is 28 out of 63.  I reviewed your office note from 07/20/2015, which you kindly included.  REVIEW OF SYSTEMS: Out of a complete 14  system review of symptoms, the patient complains only of the following symptoms, and all other reviewed systems are negative.  Walking difficulty, neck stiffness, apnea  ALLERGIES: No Known Allergies  HOME MEDICATIONS: Outpatient Medications Prior to Visit  Medication Sig Dispense Refill  . atorvastatin (LIPITOR) 20 MG tablet   2  . Biotin 5000 MCG TABS Take by mouth.    . Cholecalciferol (VITAMIN D3) 1000 units CAPS Take by mouth.    . hydrochlorothiazide (MICROZIDE) 12.5 MG capsule   0  . Mirabegron (MYRBETRIQ PO) Take by mouth.    . Multiple Vitamins-Minerals (PRESERVISION AREDS 2 PO) Take by mouth.    . potassium chloride (K-DUR) 10 MEQ tablet   2  . tamsulosin (FLOMAX) 0.4 MG CAPS capsule Take 0.4 mg by mouth.    [name] Kitchen aspirin 325 MG tablet Take 325 mg by mouth daily.    . Multiple Vitamins-Minerals (ICAPS AREDS 2 PO) Take by mouth.     No facility-administered medications prior to visit.     PAST MEDICAL HISTORY: Past Medical History:  Diagnosis Date  .  BPH (benign prostatic hyperplasia)   . Central retinal artery occlusion   . Diabetes mellitus without complication (HCC)    diet controlled  . Diverticulosis   . Hyperlipidemia   . Hypertension   . Osteoarthritis   . Prostate cancer ([place]) 01/2014   Gleason 7, 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  . S/P radiation therapy 04/23/13 - 05/29/14   Prostate/seminal vesicles, external beam 4500 cGy in 25 sessions  . Skin cancer    scalp    PAST SURGICAL HISTORY: Past Surgical History:  Procedure Laterality Date  . BACK SURGERY  2009  . [place]  . PROSTATE BIOPSY  2013, 2014, 01/2014   Gleason 7  . RADIOACTIVE SEED IMPLANT N/A 07/01/2014   Procedure: RADIOACTIVE SEED IMPLANT;  Surgeon: [name], MD;  Location: [facility];  Service: Urology;  Laterality: N/A;  DR PORTABLE  . TONSILLECTOMY      FAMILY HISTORY: Family History  Problem Relation Age of Onset  . Heart attack Father   . Cancer Father     prostate   . Diabetes Father   . Cancer Paternal Uncle     prostate  . Lung cancer Brother     SOCIAL HISTORY: Social History   Social History  . Marital status: Married    Spouse name: N/A  . Number of children: N/A  . Years of education: N/A   Occupational History  . Not on file.   Social History Main Topics  . Smoking status: Former Smoker    Quit date: 03/10/1961  . Smokeless tobacco: Never Used  . Alcohol use 0.6 oz/week    1 Glasses of wine per week  . Drug use: No  . Sexual activity: Not on file   Other Topics Concern  . Not on file   Social History Narrative   Rare caffeine use       PHYSICAL EXAM  Vitals:   05/09/16 0939  BP: 127/82  Pulse: 77  Weight: 194 lb 9.6 oz (88.3 kg)   Body mass index is 27.92 kg/m.  Generalized: Well developed, in no acute distress  Neck: Circumference 16-1/2 inches  Neurological examination  Mentation: Alert oriented to time, place, history taking. Follows all commands speech and language fluent Cranial nerve II-XII: Pupils were equal round reactive to light. Extraocular movements were full, visual field were full on confrontational test. Facial sensation and strength were normal. Uvula tongue midline. Head turning and shoulder shrug  were normal and symmetric. Mallampati 4+ Motor: The motor testing reveals 5 over 5 strength of all 4 extremities. Good symmetric motor tone is noted throughout.  Sensory: Sensory testing is intact to soft touch on all 4 extremities. No evidence of extinction is noted.  Coordination: Cerebellar testing reveals good finger-nose-finger and heel-to-shin bilaterally.  Gait and station: Gait is normal.  Reflexes: Deep tendon reflexes are symmetric and normal bilaterally.   DIAGNOSTIC DATA (LABS, IMAGING, TESTING) - I reviewed patient records, labs, notes, testing and imaging myself where available.  Lab Results  Component Value Date   WBC 6.1 06/24/2014   HGB 16.9 06/24/2014   HCT 50.0 06/24/2014    MCV 95.2 06/24/2014   PLT 148 (L) 06/24/2014      Component Value Date/Time   NA 143 06/24/2014 0854   K 3.2 (L) 06/24/2014 0854   CL 100 06/24/2014 0854   CO2 33 (H) 06/24/2014 0854   GLUCOSE 118 (H) 06/24/2014 0854   BUN 26 (H) 06/24/2014 6948  CREATININE 1.10 06/24/2014 0854   CALCIUM 9.2 06/24/2014 0854   PROT 6.8 06/24/2014 0854   ALBUMIN 4.0 06/24/2014 0854   AST 32 06/24/2014 0854   ALT 27 06/24/2014 0854   ALKPHOS 61 06/24/2014 0854   BILITOT 1.3 (H) 06/24/2014 0854   GFRNONAA 64 (L) 06/24/2014 0854   GFRAA 74 (L) 06/24/2014 0854    Lab Results  Component Value Date   HGBA1C  08/25/2008    5.8 (NOTE) The ADA recommends the following therapeutic goal for glycemic control related to Hgb A1c measurement: Goal of therapy: <6.5 Hgb A1c  Reference: American Diabetes Association: Clinical Practice Recommendations 2010, Diabetes Care, 2010, 33: (Suppl  1).      ASSESSMENT AND PLAN 75 y.o. year old male  has a past medical history of BPH (benign prostatic hyperplasia); Central retinal artery occlusion; Diabetes mellitus without complication ([place]); Diverticulosis; Hyperlipidemia; Hypertension; Osteoarthritis; Prostate cancer ([place]) (01/2014); S/P radiation therapy (04/23/13 - 05/29/14); and Skin cancer. here with:  1. Obstructive sleep apnea on CPAP  The patient will come in for a desensitization with Robyn in our sleep lab. Hopefully this will help him be able to use the machine again. Previous downloads showed that he had excellent compliance. This seems to be more psychological. However we will evaluate the machine when he comes in to ensure that this is not causing him to be compliant. He will follow-up in 3 months or sooner if needed.    Ward Givens, MSN, NP-C 05/09/2016, 10:06 AM [facility] [address] [place], [place] [address] [phone]  I reviewed the above note and documentation by the Nurse Practitioner and agree with the  history, physical exam, assessment and plan as outlined above. I was immediately available for face-to-face consultation. Star Age, MD, PhD [facility])

## 2016-05-26 DIAGNOSIS — Z23 Encounter for immunization: Secondary | ICD-10-CM | POA: Diagnosis not present

## 2016-06-09 ENCOUNTER — Encounter (HOSPITAL_COMMUNITY): Payer: Self-pay | Admitting: *Deleted

## 2016-06-09 DIAGNOSIS — Z85828 Personal history of other malignant neoplasm of skin: Secondary | ICD-10-CM | POA: Insufficient documentation

## 2016-06-09 DIAGNOSIS — Z87891 Personal history of nicotine dependence: Secondary | ICD-10-CM

## 2016-06-09 DIAGNOSIS — Z5321 Procedure and treatment not carried out due to patient leaving prior to being seen by health care provider: Secondary | ICD-10-CM

## 2016-06-09 DIAGNOSIS — E876 Hypokalemia: Secondary | ICD-10-CM | POA: Diagnosis not present

## 2016-06-09 DIAGNOSIS — E871 Hypo-osmolality and hyponatremia: Secondary | ICD-10-CM | POA: Diagnosis not present

## 2016-06-09 DIAGNOSIS — I10 Essential (primary) hypertension: Secondary | ICD-10-CM | POA: Insufficient documentation

## 2016-06-09 DIAGNOSIS — Z23 Encounter for immunization: Secondary | ICD-10-CM | POA: Diagnosis not present

## 2016-06-09 DIAGNOSIS — E119 Type 2 diabetes mellitus without complications: Secondary | ICD-10-CM

## 2016-06-09 DIAGNOSIS — D696 Thrombocytopenia, unspecified: Secondary | ICD-10-CM | POA: Diagnosis not present

## 2016-06-09 DIAGNOSIS — R1110 Vomiting, unspecified: Secondary | ICD-10-CM | POA: Insufficient documentation

## 2016-06-09 DIAGNOSIS — K56600 Partial intestinal obstruction, unspecified as to cause: Secondary | ICD-10-CM | POA: Diagnosis not present

## 2016-06-09 DIAGNOSIS — K56699 Other intestinal obstruction unspecified as to partial versus complete obstruction: Secondary | ICD-10-CM | POA: Diagnosis not present

## 2016-06-09 DIAGNOSIS — Z8546 Personal history of malignant neoplasm of prostate: Secondary | ICD-10-CM | POA: Insufficient documentation

## 2016-06-09 DIAGNOSIS — Z66 Do not resuscitate: Secondary | ICD-10-CM | POA: Diagnosis not present

## 2016-06-09 DIAGNOSIS — N179 Acute kidney failure, unspecified: Secondary | ICD-10-CM | POA: Diagnosis not present

## 2016-06-09 DIAGNOSIS — K56609 Unspecified intestinal obstruction, unspecified as to partial versus complete obstruction: Secondary | ICD-10-CM | POA: Diagnosis not present

## 2016-06-09 DIAGNOSIS — I714 Abdominal aortic aneurysm, without rupture: Secondary | ICD-10-CM | POA: Diagnosis not present

## 2016-06-09 DIAGNOSIS — E860 Dehydration: Secondary | ICD-10-CM | POA: Diagnosis not present

## 2016-06-09 DIAGNOSIS — R197 Diarrhea, unspecified: Secondary | ICD-10-CM

## 2016-06-09 LAB — CBC
HCT: 54.6 % — ABNORMAL HIGH (ref 39.0–52.0)
Hemoglobin: 17.9 g/dL — ABNORMAL HIGH (ref 13.0–17.0)
MCH: 31.9 pg (ref 26.0–34.0)
MCHC: 32.8 g/dL (ref 30.0–36.0)
MCV: 97.2 fL (ref 78.0–100.0)
Platelets: 142 10*3/uL — ABNORMAL LOW (ref 150–400)
RBC: 5.62 MIL/uL (ref 4.22–5.81)
RDW: 13.9 % (ref 11.5–15.5)
WBC: 10.1 10*3/uL (ref 4.0–10.5)

## 2016-06-09 LAB — COMPREHENSIVE METABOLIC PANEL
ALT: 30 U/L (ref 17–63)
AST: 34 U/L (ref 15–41)
Albumin: 4 g/dL (ref 3.5–5.0)
Alkaline Phosphatase: 63 U/L (ref 38–126)
Anion gap: 14 (ref 5–15)
BUN: 29 mg/dL — ABNORMAL HIGH (ref 6–20)
CO2: 28 mmol/L (ref 22–32)
Calcium: 9.5 mg/dL (ref 8.9–10.3)
Chloride: 102 mmol/L (ref 101–111)
Creatinine, Ser: 1.16 mg/dL (ref 0.61–1.24)
GFR calc Af Amer: >60 mL/min (ref >60)
GFR calc non Af Amer: 59 mL/min — ABNORMAL LOW (ref >60)
Glucose, Bld: 168 mg/dL — ABNORMAL HIGH (ref 65–99)
Potassium: 3.7 mmol/L (ref 3.5–5.1)
Sodium: 144 mmol/L (ref 135–145)
Total Bilirubin: 1.3 mg/dL — ABNORMAL HIGH (ref 0.3–1.2)
Total Protein: 6.7 g/dL (ref 6.5–8.1)

## 2016-06-09 LAB — URINALYSIS, ROUTINE W REFLEX MICROSCOPIC
Bacteria, UA: NONE SEEN
Bilirubin Urine: NEGATIVE
Glucose, UA: NEGATIVE mg/dL
Hgb urine dipstick: NEGATIVE
Ketones, ur: 5 mg/dL — AB
Leukocytes, UA: NEGATIVE
Nitrite: NEGATIVE
Protein, ur: 30 mg/dL — AB
Specific Gravity, Urine: 1.028 (ref 1.005–1.030)
pH: 5 (ref 5.0–8.0)

## 2016-06-09 LAB — LIPASE, BLOOD: Lipase: 19 U/L (ref 11–51)

## 2016-06-09 NOTE — ED Triage Notes (Signed)
The pt is c/o vomiting and diarrhea since this am and he has no energy

## 2016-06-09 NOTE — ED Notes (Addendum)
Pt's wife up to desk asking about wait time.  Discussed wait time, delays, and patient's wife stated "shouldn't we just go home and let him rest in his own bed?".  This RN explained that they have waited this long and the MD may have follow up assessments and interventions for patient, and this RN did not recommend going home.  Patient to wait "a little longer"

## 2016-06-10 ENCOUNTER — Inpatient Hospital Stay (HOSPITAL_COMMUNITY)
Admission: EM | Admit: 2016-06-10 | Discharge: 2016-06-15 | DRG: 389 | Disposition: A | Discharge status: Home / Self Care | Payer: Medicare Other | Attending: Internal Medicine | Admitting: Internal Medicine

## 2016-06-10 ENCOUNTER — Emergency Department (HOSPITAL_COMMUNITY): Payer: Medicare Other

## 2016-06-10 ENCOUNTER — Encounter (HOSPITAL_COMMUNITY): Payer: Self-pay

## 2016-06-10 ENCOUNTER — Emergency Department (HOSPITAL_COMMUNITY)
Admission: EM | Admit: 2016-06-10 | Discharge: 2016-06-10 | Disposition: A | Discharge status: Home / Self Care | Payer: Medicare Other | Source: Home / Self Care

## 2016-06-10 ENCOUNTER — Inpatient Hospital Stay (HOSPITAL_COMMUNITY): Payer: Medicare Other

## 2016-06-10 DIAGNOSIS — Z4682 Encounter for fitting and adjustment of non-vascular catheter: Secondary | ICD-10-CM | POA: Diagnosis not present

## 2016-06-10 DIAGNOSIS — E785 Hyperlipidemia, unspecified: Secondary | ICD-10-CM | POA: Diagnosis present

## 2016-06-10 DIAGNOSIS — Z85828 Personal history of other malignant neoplasm of skin: Secondary | ICD-10-CM | POA: Diagnosis not present

## 2016-06-10 DIAGNOSIS — Z923 Personal history of irradiation: Secondary | ICD-10-CM

## 2016-06-10 DIAGNOSIS — K56609 Unspecified intestinal obstruction, unspecified as to partial versus complete obstruction: Secondary | ICD-10-CM | POA: Diagnosis present

## 2016-06-10 DIAGNOSIS — K56600 Partial intestinal obstruction, unspecified as to cause: Principal | ICD-10-CM | POA: Diagnosis present

## 2016-06-10 DIAGNOSIS — R0902 Hypoxemia: Secondary | ICD-10-CM | POA: Diagnosis not present

## 2016-06-10 DIAGNOSIS — I714 Abdominal aortic aneurysm, without rupture, unspecified: Secondary | ICD-10-CM | POA: Diagnosis present

## 2016-06-10 DIAGNOSIS — Z801 Family history of malignant neoplasm of trachea, bronchus and lung: Secondary | ICD-10-CM | POA: Diagnosis not present

## 2016-06-10 DIAGNOSIS — R197 Diarrhea, unspecified: Secondary | ICD-10-CM | POA: Diagnosis not present

## 2016-06-10 DIAGNOSIS — E871 Hypo-osmolality and hyponatremia: Secondary | ICD-10-CM | POA: Diagnosis present

## 2016-06-10 DIAGNOSIS — D696 Thrombocytopenia, unspecified: Secondary | ICD-10-CM | POA: Diagnosis present

## 2016-06-10 DIAGNOSIS — K567 Ileus, unspecified: Secondary | ICD-10-CM | POA: Diagnosis not present

## 2016-06-10 DIAGNOSIS — Z978 Presence of other specified devices: Secondary | ICD-10-CM

## 2016-06-10 DIAGNOSIS — N179 Acute kidney failure, unspecified: Secondary | ICD-10-CM | POA: Diagnosis present

## 2016-06-10 DIAGNOSIS — G4733 Obstructive sleep apnea (adult) (pediatric): Secondary | ICD-10-CM | POA: Diagnosis present

## 2016-06-10 DIAGNOSIS — Z79899 Other long term (current) drug therapy: Secondary | ICD-10-CM | POA: Diagnosis not present

## 2016-06-10 DIAGNOSIS — I10 Essential (primary) hypertension: Secondary | ICD-10-CM | POA: Diagnosis present

## 2016-06-10 DIAGNOSIS — E860 Dehydration: Secondary | ICD-10-CM | POA: Diagnosis present

## 2016-06-10 DIAGNOSIS — E876 Hypokalemia: Secondary | ICD-10-CM | POA: Diagnosis present

## 2016-06-10 DIAGNOSIS — E119 Type 2 diabetes mellitus without complications: Secondary | ICD-10-CM | POA: Diagnosis present

## 2016-06-10 DIAGNOSIS — Z87891 Personal history of nicotine dependence: Secondary | ICD-10-CM | POA: Diagnosis not present

## 2016-06-10 DIAGNOSIS — Z66 Do not resuscitate: Secondary | ICD-10-CM | POA: Diagnosis present

## 2016-06-10 DIAGNOSIS — R1110 Vomiting, unspecified: Secondary | ICD-10-CM | POA: Diagnosis not present

## 2016-06-10 DIAGNOSIS — Z8249 Family history of ischemic heart disease and other diseases of the circulatory system: Secondary | ICD-10-CM | POA: Diagnosis not present

## 2016-06-10 DIAGNOSIS — Z833 Family history of diabetes mellitus: Secondary | ICD-10-CM

## 2016-06-10 DIAGNOSIS — Z23 Encounter for immunization: Secondary | ICD-10-CM | POA: Diagnosis not present

## 2016-06-10 DIAGNOSIS — Z7982 Long term (current) use of aspirin: Secondary | ICD-10-CM | POA: Diagnosis not present

## 2016-06-10 DIAGNOSIS — K56699 Other intestinal obstruction unspecified as to partial versus complete obstruction: Secondary | ICD-10-CM | POA: Diagnosis not present

## 2016-06-10 DIAGNOSIS — Z8546 Personal history of malignant neoplasm of prostate: Secondary | ICD-10-CM | POA: Diagnosis not present

## 2016-06-10 DIAGNOSIS — N400 Enlarged prostate without lower urinary tract symptoms: Secondary | ICD-10-CM | POA: Diagnosis present

## 2016-06-10 DIAGNOSIS — C61 Malignant neoplasm of prostate: Secondary | ICD-10-CM | POA: Diagnosis present

## 2016-06-10 DIAGNOSIS — Z9989 Dependence on other enabling machines and devices: Secondary | ICD-10-CM

## 2016-06-10 HISTORY — DX: Unspecified intestinal obstruction, unspecified as to partial versus complete obstruction: K56.609

## 2016-06-10 LAB — CBC WITH DIFFERENTIAL/PLATELET
Basophils Absolute: 0 10*3/uL (ref 0.0–0.1)
Basophils Relative: 0 %
Eosinophils Absolute: 0 10*3/uL (ref 0.0–0.7)
Eosinophils Relative: 0 %
HCT: 53.7 % — ABNORMAL HIGH (ref 39.0–52.0)
Hemoglobin: 17.7 g/dL — ABNORMAL HIGH (ref 13.0–17.0)
Lymphocytes Relative: 14 %
Lymphs Abs: 0.8 10*3/uL (ref 0.7–4.0)
MCH: 32 pg (ref 26.0–34.0)
MCHC: 33 g/dL (ref 30.0–36.0)
MCV: 97.1 fL (ref 78.0–100.0)
Monocytes Absolute: 0.6 10*3/uL (ref 0.1–1.0)
Monocytes Relative: 11 %
Neutro Abs: 4.3 10*3/uL (ref 1.7–7.7)
Neutrophils Relative %: 75 %
Platelets: 170 10*3/uL (ref 150–400)
RBC: 5.53 MIL/uL (ref 4.22–5.81)
RDW: 14.1 % (ref 11.5–15.5)
WBC: 5.7 10*3/uL (ref 4.0–10.5)

## 2016-06-10 LAB — COMPREHENSIVE METABOLIC PANEL
ALT: 36 U/L (ref 17–63)
AST: 47 U/L — ABNORMAL HIGH (ref 15–41)
Albumin: 3.8 g/dL (ref 3.5–5.0)
Alkaline Phosphatase: 56 U/L (ref 38–126)
Anion gap: 10 (ref 5–15)
BUN: 39 mg/dL — ABNORMAL HIGH (ref 6–20)
CO2: 30 mmol/L (ref 22–32)
Calcium: 9.5 mg/dL (ref 8.9–10.3)
Chloride: 103 mmol/L (ref 101–111)
Creatinine, Ser: 1.31 mg/dL — ABNORMAL HIGH (ref 0.61–1.24)
GFR calc Af Amer: 59 mL/min — ABNORMAL LOW (ref >60)
GFR calc non Af Amer: 51 mL/min — ABNORMAL LOW (ref >60)
Glucose, Bld: 143 mg/dL — ABNORMAL HIGH (ref 65–99)
Potassium: 3.6 mmol/L (ref 3.5–5.1)
Sodium: 143 mmol/L (ref 135–145)
Total Bilirubin: 1.2 mg/dL (ref 0.3–1.2)
Total Protein: 6.5 g/dL (ref 6.5–8.1)

## 2016-06-10 MED ORDER — SODIUM CHLORIDE 0.9 % IV SOLN: INTRAVENOUS | Status: DC

## 2016-06-10 MED ORDER — SODIUM CHLORIDE 0.9 % IV SOLN: INTRAVENOUS | Status: AC

## 2016-06-10 MED ORDER — LORAZEPAM 2 MG/ML IJ SOLN: 0.5000 mg | Freq: Every evening | INTRAMUSCULAR | Status: DC | PRN

## 2016-06-10 MED ORDER — SODIUM CHLORIDE 0.9 % IV BOLUS (SEPSIS)
1000.0000 mL | Freq: Once | INTRAVENOUS | Status: AC
  Administered 2016-06-10: 1000 mL via INTRAVENOUS

## 2016-06-10 MED ORDER — POTASSIUM CHLORIDE 2 MEQ/ML IV SOLN: INTRAVENOUS | Status: DC

## 2016-06-10 MED ORDER — POTASSIUM CHLORIDE 2 MEQ/ML IV SOLN
INTRAVENOUS | Status: DC
  Administered 2016-06-10 – 2016-06-11 (×2): via INTRAVENOUS
  Filled 2016-06-10 (×3): qty 1000

## 2016-06-10 MED ORDER — HYDRALAZINE HCL 20 MG/ML IJ SOLN: 10.0000 mg | Freq: Four times a day (QID) | INTRAMUSCULAR | Status: DC | PRN

## 2016-06-10 MED ORDER — ACETAMINOPHEN 325 MG PO TABS: 650.0000 mg | ORAL_TABLET | Freq: Four times a day (QID) | ORAL | Status: DC | PRN

## 2016-06-10 MED ORDER — ONDANSETRON HCL 4 MG/2ML IJ SOLN
4.0000 mg | Freq: Four times a day (QID) | INTRAMUSCULAR | Status: DC | PRN
  Administered 2016-06-10: 4 mg via INTRAVENOUS
  Filled 2016-06-10: qty 2

## 2016-06-10 MED ORDER — PROMETHAZINE HCL 25 MG/ML IJ SOLN
12.5000 mg | Freq: Four times a day (QID) | INTRAMUSCULAR | Status: DC | PRN
  Filled 2016-06-10: qty 1

## 2016-06-10 MED ORDER — ACETAMINOPHEN 650 MG RE SUPP: 650.0000 mg | Freq: Four times a day (QID) | RECTAL | Status: DC | PRN

## 2016-06-10 MED ORDER — SODIUM CHLORIDE 0.9 % IV SOLN
INTRAVENOUS | Status: DC
  Administered 2016-06-10: 16:00:00 via INTRAVENOUS

## 2016-06-10 MED ORDER — IOPAMIDOL (ISOVUE-300) INJECTION 61%
INTRAVENOUS | Status: AC
  Administered 2016-06-10: 100 mL
  Filled 2016-06-10: qty 100

## 2016-06-10 MED ORDER — ENOXAPARIN SODIUM 40 MG/0.4ML ~~LOC~~ SOLN
40.0000 mg | SUBCUTANEOUS | Status: DC
  Administered 2016-06-10 – 2016-06-14 (×5): 40 mg via SUBCUTANEOUS
  Filled 2016-06-10 (×5): qty 0.4

## 2016-06-10 MED ORDER — MORPHINE SULFATE (PF) 2 MG/ML IV SOLN
1.0000 mg | INTRAVENOUS | Status: DC | PRN
  Filled 2016-06-10: qty 1

## 2016-06-10 MED ORDER — ONDANSETRON HCL 4 MG/2ML IJ SOLN
4.0000 mg | Freq: Once | INTRAMUSCULAR | Status: AC
  Administered 2016-06-10: 4 mg via INTRAVENOUS
  Filled 2016-06-10: qty 2

## 2016-06-10 NOTE — ED Notes (Signed)
Attempted to call report to [phone] 

## 2016-06-10 NOTE — ED Notes (Signed)
SpO2 during vitals recheck 91%.  This RN reassessed lung sounds, and chest xray ordered.

## 2016-06-10 NOTE — ED Provider Notes (Signed)
[facility] DEPT Provider Note   CSN: [id number] Arrival date & time: 06/10/16  0805     History   Chief Complaint Chief Complaint  Patient presents with  . Emesis  . Diarrhea    HPI Jeremiah [name] is a 76 y.o. male.  Jeremiah [name] is a 76 y.o. Male who presents to the emergency department complaining of nausea, vomiting, diarrhea and abdominal bloating for the past 4 days. Patient reports his symptoms began with vomiting and progressed to having diarrhea. He reports today he's had one episode of vomiting and one episode of diarrhea. He tells me he feels nauseated. He complains of his abdomen feeling distended. He denies recent antibiotic use. No sick contacts at home. He denies fevers. He denies urinary symptoms. He denies any previous surgeries to his abdomen. Patient tells me feeling fatigued and has some lightheadedness with position change. Patient denies fevers, urinary symptoms, cough, chest pain, shortness of breath, hematemesis, hematochezia, or rashes.   The history is provided by the patient. No language interpreter was used.  Emesis   Associated symptoms include abdominal pain and diarrhea. Pertinent negatives include no chills, no cough, no fever and no headaches.  Diarrhea   Associated symptoms include abdominal pain and vomiting. Pertinent negatives include no chills, no headaches and no cough.    Past Medical History:  Diagnosis Date  . BPH (benign prostatic hyperplasia)   . Central retinal artery occlusion   . Diabetes mellitus without complication (HCC)    diet controlled  . Diverticulosis   . Hyperlipidemia   . Hypertension   . Osteoarthritis   . 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 S/P radiation therapy 04/23/13 - 05/29/14   Prostate/seminal vesicles, external beam 4500 cGy in 25 sessions  . Skin cancer    scalp    Patient Active Problem List   Diagnosis Date Noted  . Small bowel obstruction 06/10/2016  . HLD  (hyperlipidemia) 06/10/2016  . HTN (hypertension) 06/10/2016  . Acute kidney injury ([place]) 06/10/2016  . Dehydration, moderate 06/10/2016  . AAA (abdominal aortic aneurysm) ([place]) 06/10/2016  . OSA on CPAP 06/10/2016  . Malignant neoplasm of prostate s/p radiation 2015 03/10/2014    Past Surgical History:  Procedure Laterality Date  . BACK SURGERY  2009  . [place]  . PROSTATE BIOPSY  2013, 2014, 01/2014   Gleason 7  . RADIOACTIVE SEED IMPLANT N/A 07/01/2014   Procedure: RADIOACTIVE SEED IMPLANT;  Surgeon: [name], MD;  Location: [facility];  Service: Urology;  Laterality: N/A;  DR PORTABLE  . TONSILLECTOMY         Home Medications    Prior to Admission medications   Medication Sig Start Date End Date Taking? Authorizing Provider  aspirin EC 81 MG tablet Take 81 mg by mouth.   Yes Historical Provider, MD  atorvastatin (LIPITOR) 20 MG tablet Take 20 mg by mouth daily.  02/06/14  Yes Historical Provider, MD  azithromycin (ZITHROMAX) 500 MG tablet Take 250-500 mg by mouth taper from 4 doses each day to 1 dose and stop. 06/09/16  Yes Historical Provider, MD  Cholecalciferol (VITAMIN D3) 1000 units CAPS Take 2,000 Units by mouth daily.    Yes Historical Provider, MD  hydrochlorothiazide (MICROZIDE) 12.5 MG capsule Take 12.5 mg by mouth daily.  07/14/15  Yes Historical Provider, MD  Multiple Vitamins-Minerals (PRESERVISION AREDS 2 PO) Take 1 tablet by mouth daily.    Yes  Historical Provider, MD  oseltamivir (TAMIFLU) 75 MG capsule Take 75 mg by mouth daily. 06/09/16  Yes Historical Provider, MD  potassium chloride (K-DUR) 10 MEQ tablet Take 20 mEq by mouth daily.  06/16/15  Yes Historical Provider, MD  tamsulosin (FLOMAX) 0.4 MG CAPS capsule Take 0.4 mg by mouth daily.    Yes Historical Provider, MD    Family History Family History  Problem Relation Age of Onset  . Heart attack Father   . Cancer Father     prostate  . Diabetes Father   . Cancer  Paternal Uncle     prostate  . Lung cancer Brother     Social History Social History  Substance Use Topics  . Smoking status: Former Smoker    Quit date: 03/10/1961  . Smokeless tobacco: Never Used  . Alcohol use 0.6 oz/week    1 Glasses of wine per week     Allergies   Patient has no known allergies.   Review of Systems Review of Systems  Constitutional: Positive for fatigue. Negative for chills and fever.  HENT: Negative for congestion and sore throat.   Eyes: Negative for visual disturbance.  Respiratory: Negative for cough, shortness of breath and wheezing.   Cardiovascular: Negative for chest pain.  Gastrointestinal: Positive for abdominal distention, abdominal pain, diarrhea, nausea and vomiting. Negative for blood in stool and constipation.  Genitourinary: Negative for difficulty urinating and dysuria.  Musculoskeletal: Negative for back pain and neck pain.  Skin: Negative for rash.  Neurological: Negative for headaches.     Physical Exam Updated Vital Signs BP 145/88   Pulse 94   Temp 98.6 F (37 C) (Oral)   Resp 22   Ht 5\' 10"  (1.778 m)   Wt 88.5 kg   SpO2 96%   BMI 27.98 kg/m   Physical Exam  Constitutional: He appears well-developed and well-nourished. No distress.  Non-toxic appearing.   HENT:  Head: Normocephalic and atraumatic.  Mouth/Throat: Oropharynx is clear and moist.  Eyes: Conjunctivae are normal. Pupils are equal, round, and reactive to light. Right eye exhibits no discharge. Left eye exhibits no discharge.  Neck: Normal range of motion. Neck supple. No JVD present. No tracheal deviation present.  Cardiovascular: Regular rhythm, normal heart sounds and intact distal pulses.  Exam reveals no gallop and no friction rub.   No murmur heard. HR is 104  Pulmonary/Chest: Effort normal and breath sounds normal. No stridor. No respiratory distress. He has no wheezes. He has no rales.  Abdominal: Soft. Bowel sounds are normal. He exhibits no  mass. There is tenderness. There is no rebound and no guarding.  Abdomen is soft and has mild generalized bulging tenderness to palpation. No focal tenderness. No CVA or flank tenderness. No peritoneal signs.  Musculoskeletal: He exhibits no edema.  Lymphadenopathy:    He has no cervical adenopathy.  Neurological: He is alert. Coordination normal.  Skin: Skin is warm and dry. Capillary refill takes less than 2 seconds. No rash noted. He is not diaphoretic. No erythema. No pallor.  Psychiatric: He has a normal mood and affect. His behavior is normal.  Nursing note and vitals reviewed.    ED Treatments / Results  Labs (all labs ordered are listed, but only abnormal results are displayed) Labs Reviewed  COMPREHENSIVE METABOLIC PANEL - Abnormal; Notable for the following:       Result Value   Glucose, Bld 143 (*)    BUN 39 (*)    Creatinine, Ser 1.31 (*)  AST 47 (*)    GFR calc non Af Amer 51 (*)    GFR calc Af Amer 59 (*)    All other components within normal limits  CBC WITH DIFFERENTIAL/PLATELET - Abnormal; Notable for the following:    Hemoglobin 17.7 (*)    HCT 53.7 (*)    All other components within normal limits  I-STAT CHEM 8, ED    EKG  EKG Interpretation None       Radiology Dg Chest 2 View  Result Date: 06/10/2016 CLINICAL DATA:  Emesis with possible aspiration. EXAM: CHEST  2 VIEW COMPARISON:  Chest radiograph 05/27/2014 FINDINGS: Chronic elevation of right hemidiaphragm. There is adjacent right basilar atelectasis. Streaky atelectasis noted left lung base. Mild tortuosity of the thoracic aorta. Heart is at the upper limits of normal in size. No pulmonary edema or pleural fluid. No confluent airspace opacity. No acute osseous abnormality is seen. IMPRESSION: Chronic elevation of right hemidiaphragm. Streaky bibasilar opacities consistent with atelectasis, and this finding can be seen with aspiration. No confluent airspace disease. Electronically Signed   By:  Jeb Levering M.D.   On: 06/10/2016 01:43   Ct Abdomen Pelvis W Contrast  Result Date: 06/10/2016 CLINICAL DATA:  Bloating, nausea, vomiting and diarrhea. EXAM: CT ABDOMEN AND PELVIS WITH CONTRAST TECHNIQUE: Multidetector CT imaging of the abdomen and pelvis was performed using the standard protocol following bolus administration of intravenous contrast. CONTRAST:  149mL ISOVUE-300 IOPAMIDOL (ISOVUE-300) INJECTION 61% COMPARISON:  08/25/2008 FINDINGS: Lower chest: No acute abnormality. Hepatobiliary: The liver is unremarkable. The gallbladder lumen demonstrates mild increased density dependently which may represent small layering gallstones. Pancreas: Unremarkable. No pancreatic ductal dilatation or surrounding inflammatory changes. Spleen: Normal in size without focal abnormality. Adrenals/Urinary Tract: Adrenal glands are unremarkable. Small nonobstructing calculi are present in the upper right kidney. The largest measures 5 mm. No hydronephrosis or renal masses. Bladder is unremarkable. Stomach/Bowel: There is evidence of gastric and small bowel distention. Dilated small bowel measures up to approximately 5 cm in greatest caliber. Relative transition between dilated and nondilated small bowel occurs in the right lower quadrant at roughly the level of the distal jejunum. No evidence of bowel perforation, pneumatosis or focal abscess. The colon demonstrates diffuse diverticular disease without evidence of diverticulitis. No focal bowel lesions are identified by CT. 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 Aorta reaches maximal caliber of 3.2 x 3.4 cm and contains a mild amount of left-sided mural thrombus. Dilatation and is prior to the aortic bifurcation. No evidence of iliac aneurysmal disease. Visceral arteries appear normally patent. Reproductive: Interval brachytherapy seed placement within the prostate gland. Other: No  ascites or hernias. 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 IMPRESSION: 1. Partial small bowel obstruction with dilated small bowel demonstrating relative transition in the right lower quadrant at the level of the distal jejunum. No evidence of associated bowel perforation or focal abscess. 2. 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 The aorta reaches maximal caliber of 3.2 x 3.4 cm just below the IMA origin. Recommend followup by ultrasound in 2 years. This recommendation follows ACR consensus guidelines: White Paper of the ACR Incidental Findings Committee II on Vascular Findings. J Am Coll Radiol 2013; 10:789-794. Electronically Signed   By: Aletta Edouard M.D.   On: 06/10/2016 12:03    Procedures Procedures (including critical care time)  Medications Ordered in ED Medications  ondansetron (ZOFRAN) injection 4 mg (4 mg Intravenous Given 06/10/16 1559)    Or  promethazine (PHENERGAN) injection 12.5 mg ( Intravenous See Alternative 06/10/16 1559)  0.9 %  sodium chloride infusion (not administered)    Followed by  dextrose 5 % and 0.9% NaCl 1,000 mL with potassium chloride 30 mEq infusion (not administered)  hydrALAZINE (APRESOLINE) injection 10 mg (not administered)  sodium chloride 0.9 % bolus 1,000 mL (0 mLs Intravenous Stopped 06/10/16 1343)  ondansetron (ZOFRAN) injection 4 mg (4 mg Intravenous Given 06/10/16 1043)  iopamidol (ISOVUE-300) 61 % injection (100 mLs  Contrast Given 06/10/16 1119)     Initial Impression / Assessment and Plan / ED Course  I have reviewed the triage vital signs and the nursing notes.  Pertinent labs & imaging results that were available during my care of the patient were reviewed by me and considered in my medical decision making (see chart for details).   patient presented to the emergency room with nausea, vomiting and diarrhea  for 4 days. On exam patient is afebrile nontoxic appearing. He has generalized bowel tenderness to palpation and feels slightly distended.  CT abdomen and pelvis shows partial small bowel obstruction. CMP shows elevated creatinine of 1.31. CBC shows dehydration. Normal white count.  Initially patient certainly feeling better after Zofran. At recheck he began feeling nauseated again. NG tube was placed and patient began feeling better. Patient and family agree with plan for admission.  I consulted with general surgeon Dr. Brantley Stage who received the patient in consult and likely patient admitted to medicine.  I spoke with nurse practitioner Ebony Hail who accepted the patient for admission.   This patient was discussed with Dr. Venora Maples who agrees with assessment and plan.   Final Clinical Impressions(s) / ED Diagnoses   Final diagnoses:  Small bowel obstruction    New Prescriptions New Prescriptions   No medications on file     Waynetta Pean, PA-C 06/10/16 [place], MD 06/10/16 1704

## 2016-06-10 NOTE — ED Triage Notes (Signed)
Pt reports vomiting and diarrhea. He reports he was here last night and left due to the wait time. Pt ambulatory. No acute distress noted.

## 2016-06-10 NOTE — ED Notes (Addendum)
Patient and wife seen walking towards the door.  Patient's wife went to get car and this Rn approached patient.  Patient made aware that chest xray results were available and RN strongly recommended patient stay.  Patient refused.  This RN explained that we can show labs online, but cannot treat issues.  Patient asked kindly for patient to reconsider staying.  Sat with patient and discussed reasons for coming to ED.  Patient states he does not want to wait, patient does not want RN to contact Charge RN, patient states he will follow up otherwise.  This RN asked patient to verbalize plan to follow up, patient states "I'll get it taken care of"

## 2016-06-10 NOTE — H&P (Signed)
History and Physical    Jeremiah [name] XHB:[id number] DOB: 10/19/1940 DOA: 06/10/2016   PCP: [name], MD   Patient coming from/Resides with: Private residence/lives with wife  Admission status: Inpatient/Floor-medically necessary to stay a minimum 2 midnights to rule out impending and/or unexpected changes in physiologic status that may differ from initial evaluation performed in the ER and/or at time of admission. Patient presents with recurrent high-volume emesis for 4 days with associated abdominal distention and pain. Workup in ER consistent with small bowel obstruction. NG tube placed with significant volume of thick bilious returns noted in canister (canister completely filled). He will require bowel rest, NG tube to low wall suction, IV fluids, strict intake and output, serial abdominal films to follow possible resolution versus worsening of obstruction pattern. Surgery was contacted by EDP and no acute surgical needs identified at that time.  Chief Complaint: Abdominal distention/pain with nausea and vomiting   HPI: Jeremiah [name] is a 76 y.o. male with medical history significant for hypertension, dyslipidemia and history of prostate cancer with prior radiation. Patient also reports prior to radiation therapy had a previous hospitalization for bowel obstruction. Patient reports for the past 4 days he said 2-3 high-volume emesis of bilious fluids. He's also had episodes of watery diarrhea. He has not had any flatus for 2 days. He initially presented to the ER yesterday evening but left prior to being seen. Please see notes dictated by ER staff. He returned today. Imaging consistent with small bowel obstruction. Prior to placement of NG tube patient had mild hypoxemia requiring oxygen. Since NG tube placed breathing has eased as has abdominal distention and patient reports increased comfort.  ED Course:  Vital Signs: BP 145/88   Pulse 94   Temp 98.6 F (37 C) (Oral)   Resp 22   Ht  5\' 10"  (1.778 m)   Wt 88.5 kg (195 lb)   SpO2 96%   BMI 27.98 kg/m  2 view chest x-ray: Streaky bibasilar opacities consistent with atelectasis without confluent airspace disease CT abdomen and pelvis with contrast: Small bowel obstruction with dilated small bowel with transition zone right lower quadrant at the level of the distal jejunum. No evidence of bowel perforation or abscess. 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 Recommendation is follow-up ultrasound in 2 years Lab data: Sodium 143, potassium 3.6, chloride 103, CO2 30, glucose 143, BUN 39, creatinine 1.31, AST 47 otherwise LFTs normal, white count 5700 with normal differential, hemoglobin 17.7, platelets 170,000 Medications and treatments: Normal saline bolus 1 L, Zofran 4 mg IV 2 doses  Review of Systems:  In addition to the HPI above,  No Fever-chills, myalgias or other constitutional symptoms No Headache, changes with Vision or hearing, new weakness, tingling, numbness in any extremity, dizziness, dysarthria or word finding difficulty, gait disturbance or imbalance, tremors or seizure activity No problems swallowing food or Liquids, indigestion/reflux, choking or coughing while eating, abdominal pain with or after eating No Chest pain, Cough or Shortness of Breath, palpitations, orthopnea or DOE No melena,hematochezia, dark tarry stools, constipation No dysuria, malodorous urine, hematuria or flank pain No new skin rashes, lesions, masses or bruises, No new joint pains, aches, swelling or redness No recent unintentional weight gain or loss No polyuria, polydypsia or polyphagia   Past Medical History:  Diagnosis Date  . BPH (benign prostatic hyperplasia)   . Central retinal artery occlusion   . Diabetes mellitus without complication (HCC)    diet controlled  .  Diverticulosis   . Hyperlipidemia   . Hypertension   . Osteoarthritis   . 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  S/P radiation therapy 04/23/13 - 05/29/14   Prostate/seminal vesicles, external beam 4500 cGy in 25 sessions  . Skin cancer    scalp    Past Surgical History:  Procedure Laterality Date  . BACK SURGERY  2009  . [place]  . PROSTATE BIOPSY  2013, 2014, 01/2014   Gleason 7  . RADIOACTIVE SEED IMPLANT N/A 07/01/2014   Procedure: RADIOACTIVE SEED IMPLANT;  Surgeon: [name], MD;  Location: [facility];  Service: Urology;  Laterality: N/A;  DR PORTABLE  . TONSILLECTOMY      Social History   Social History  . Marital status: Married    Spouse name: N/A  . Number of children: N/A  . Years of education: N/A   Occupational History  . Not on file.   Social History Main Topics  . Smoking status: Former Smoker    Quit date: 03/10/1961  . Smokeless tobacco: Never Used  . Alcohol use 0.6 oz/week    1 Glasses of wine per week  . Drug use: No  . Sexual activity: Not on file   Other Topics Concern  . Not on file   Social History Narrative   Rare caffeine use     Mobility: Without assistive devices Work history: Not obtained   No Known Allergies  Family History  Problem Relation Age of Onset  . Heart attack Father   . Cancer Father     prostate  . Diabetes Father   . Cancer Paternal Uncle     prostate  . Lung cancer Brother      Prior to Admission medications   Medication Sig Start Date End Date Taking? Authorizing Provider  aspirin EC 81 MG tablet Take 81 mg by mouth.    Historical Provider, MD  atorvastatin (LIPITOR) 20 MG tablet  02/06/14   Historical Provider, MD  Biotin 5000 MCG TABS Take by mouth.    Historical Provider, MD  Calcium-Magnesium-Vitamin D [phone] MG-MG-UNIT TABS Take by mouth.    Historical Provider, MD  Cholecalciferol (VITAMIN D3) 1000 units CAPS Take by mouth.    Historical Provider, MD  hydrochlorothiazide (MICROZIDE) 12.5 MG capsule  07/14/15   Historical Provider, MD  Mirabegron (MYRBETRIQ PO) Take by  mouth.    Historical Provider, MD  Multiple Vitamins-Minerals (PRESERVISION AREDS 2 PO) Take by mouth.    Historical Provider, MD  potassium chloride (K-DUR) 10 MEQ tablet  06/16/15   Historical Provider, MD  tamsulosin (FLOMAX) 0.4 MG CAPS capsule Take 0.4 mg by mouth.    Historical Provider, MD    Physical Exam: Vitals:   06/10/16 1415 06/10/16 1445 06/10/16 1500 06/10/16 1515  BP: 135/76 144/84 135/78 145/88  Pulse: 92 93 92 94  Resp: 22 24 22 22   Temp:      TempSrc:      SpO2: 95% 93% 94% 96%  Weight:      Height:          Constitutional: NAD, calm, comfortable Eyes: PERRL, lids and conjunctivae normal ENMT: Mucous membranes are dry. Posterior pharynx clear of any exudate or lesions.Normal dentition. NG tube inserted through right nare Neck: normal, supple, no masses, no thyromegaly Respiratory: clear to auscultation bilaterally, no wheezing, no crackles. Normal respiratory effort. No accessory muscle use.  Cardiovascular: Regular rate and rhythm, no  murmurs / rubs / gallops. No extremity edema. 2+ pedal pulses. No carotid bruits.  Abdomen: no tenderness, no masses palpated. Mildly distended. No hepatosplenomegaly. Bowel sounds tinkling and hyper echoic. NG tube with thick bilious returns, large volume. Musculoskeletal: no clubbing / cyanosis. No joint deformity upper and lower extremities. Good ROM, no contractures. Normal muscle tone.  Skin: no rashes, lesions, ulcers. No induration Neurologic: CN 2-12 grossly intact. Sensation intact, DTR normal. Strength 5/5 x all 4 extremities.  Psychiatric: Normal judgment and insight. Alert and oriented x 3. Normal mood.    Labs on Admission: I have personally reviewed following labs and imaging studies  CBC:  Recent Labs Lab 06/09/16 2058 06/10/16 1334  WBC 10.1 5.7  NEUTROABS  --  4.3  HGB 17.9* 17.7*  HCT 54.6* 53.7*  MCV 97.2 97.1  PLT 142* 026   Basic Metabolic Panel:  Recent Labs Lab 06/09/16 2058 06/10/16 1334    NA 144 143  K 3.7 3.6  CL 102 103  CO2 28 30  GLUCOSE 168* 143*  BUN 29* 39*  CREATININE 1.16 1.31*  CALCIUM 9.5 9.5   GFR: Estimated Creatinine Clearance: 53.7 mL/min (by C-G formula based on SCr of 1.31 mg/dL (H)). Liver Function Tests:  Recent Labs Lab 06/09/16 2058 06/10/16 1334  AST 34 47*  ALT 30 36  ALKPHOS 63 56  BILITOT 1.3* 1.2  PROT 6.7 6.5  ALBUMIN 4.0 3.8    Recent Labs Lab 06/09/16 2058  LIPASE 19   No results for input(s): AMMONIA in the last 168 hours. Coagulation Profile: No results for input(s): INR, PROTIME in the last 168 hours. Cardiac Enzymes: No results for input(s): CKTOTAL, CKMB, CKMBINDEX, TROPONINI in the last 168 hours. BNP (last 3 results) No results for input(s): PROBNP in the last 8760 hours. HbA1C: No results for input(s): HGBA1C in the last 72 hours. CBG: No results for input(s): GLUCAP in the last 168 hours. Lipid Profile: No results for input(s): CHOL, HDL, LDLCALC, TRIG, CHOLHDL, LDLDIRECT in the last 72 hours. Thyroid Function Tests: No results for input(s): TSH, T4TOTAL, FREET4, T3FREE, THYROIDAB in the last 72 hours. Anemia Panel: No results for input(s): VITAMINB12, FOLATE, FERRITIN, TIBC, IRON, RETICCTPCT in the last 72 hours. Urine analysis:    Component Value Date/Time   COLORURINE YELLOW 06/09/2016 2111   APPEARANCEUR CLEAR 06/09/2016 2111   LABSPEC 1.028 06/09/2016 2111   PHURINE 5.0 06/09/2016 2111   [place] 06/09/2016 2111   HGBUR NEGATIVE 06/09/2016 2111   BILIRUBINUR NEGATIVE 06/09/2016 2111   KETONESUR 5 (A) 06/09/2016 2111   PROTEINUR 30 (A) 06/09/2016 2111   UROBILINOGEN 1.0 08/25/2008 0326   NITRITE NEGATIVE 06/09/2016 2111   LEUKOCYTESUR NEGATIVE 06/09/2016 2111   Sepsis Labs: @LABRCNTIP (procalcitonin:4,lacticidven:4) )No results found for this or any previous visit (from the past 240 hour(s)).   Radiological Exams on Admission: Dg Chest 2 View  Result Date: 06/10/2016 CLINICAL  DATA:  Emesis with possible aspiration. EXAM: CHEST  2 VIEW COMPARISON:  Chest radiograph 05/27/2014 FINDINGS: Chronic elevation of right hemidiaphragm. There is adjacent right basilar atelectasis. Streaky atelectasis noted left lung base. Mild tortuosity of the thoracic aorta. Heart is at the upper limits of normal in size. No pulmonary edema or pleural fluid. No confluent airspace opacity. No acute osseous abnormality is seen. IMPRESSION: Chronic elevation of right hemidiaphragm. Streaky bibasilar opacities consistent with atelectasis, and this finding can be seen with aspiration. No confluent airspace disease. Electronically Signed   By: Fonnie Birkenhead.D.  On: 06/10/2016 01:43   Ct Abdomen Pelvis W Contrast  Result Date: 06/10/2016 CLINICAL DATA:  Bloating, nausea, vomiting and diarrhea. EXAM: CT ABDOMEN AND PELVIS WITH CONTRAST TECHNIQUE: Multidetector CT imaging of the abdomen and pelvis was performed using the standard protocol following bolus administration of intravenous contrast. CONTRAST:  181mL ISOVUE-300 IOPAMIDOL (ISOVUE-300) INJECTION 61% COMPARISON:  08/25/2008 FINDINGS: Lower chest: No acute abnormality. Hepatobiliary: The liver is unremarkable. The gallbladder lumen demonstrates mild increased density dependently which may represent small layering gallstones. Pancreas: Unremarkable. No pancreatic ductal dilatation or surrounding inflammatory changes. Spleen: Normal in size without focal abnormality. Adrenals/Urinary Tract: Adrenal glands are unremarkable. Small nonobstructing calculi are present in the upper right kidney. The largest measures 5 mm. No hydronephrosis or renal masses. Bladder is unremarkable. Stomach/Bowel: There is evidence of gastric and small bowel distention. Dilated small bowel measures up to approximately 5 cm in greatest caliber. Relative transition between dilated and nondilated small bowel occurs in the right lower quadrant at roughly the level of the distal jejunum.  No evidence of bowel perforation, pneumatosis or focal abscess. The colon demonstrates diffuse diverticular disease without evidence of diverticulitis. No focal bowel lesions are identified by CT. 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 Aorta reaches maximal caliber of 3.2 x 3.4 cm and contains a mild amount of left-sided mural thrombus. Dilatation and is prior to the aortic bifurcation. No evidence of iliac aneurysmal disease. Visceral arteries appear normally patent. Reproductive: Interval brachytherapy seed placement within the prostate gland. Other: No ascites or hernias. 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 IMPRESSION: 1. Partial small bowel obstruction with dilated small bowel demonstrating relative transition in the right lower quadrant at the level of the distal jejunum. No evidence of associated bowel perforation or focal abscess. 2. 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 The aorta reaches maximal caliber of 3.2 x 3.4 cm just below the IMA origin. Recommend followup by ultrasound in 2 years. This recommendation follows ACR consensus guidelines: White Paper of the ACR Incidental Findings Committee II on Vascular Findings. J Am Coll Radiol 2013; 10:789-794. Electronically Signed   By: Aletta Edouard M.D.   On: 06/10/2016 12:03    EKG: (Independently reviewed) sinus rhythm ventricular rate 91 bpm, QTC 456 ms, no ischemic changes  Assessment/Plan Principal Problem:   Small bowel obstruction -Patient presents with intractable nausea and vomiting high volume as well as watery diarrhea with clinical findings as well as imaging consistent with small bowel obstruction -Transition zone right lower quadrant-patient with prior pelvic radiation for prostate cancer -NPO, bowel rest and IV  fluids -Gen. surgery contacted by EDP-no acute surgical concerns at this juncture -IV Zofran for mild nausea and vomiting with dose adjusted Phenergan or intractable nausea and vomiting -Low-dose morphine for abdominal pain-unable to use NSAIDs secondary to acute kidney injury -Due to high-volume GI output add potassium to maintenance fluid after current bag of saline infused -Repeat abdominal film in a.m.  Active Problems:   Acute kidney injury  -Baseline: 0.98 to 1.10 -Current: 1.31 -Continue IV fluid hydration -Labs in a.m. -Avoid offending medications including IV NSAIDs    Malignant neoplasm of prostate s/p radiation 2015 -Suspect scar tissue/adhesions from radiation contributing to current symptomatology -NPO secondary to bowel obstruction so unable to take Flomax -Has experienced overflow incontinence in the ER so we'll apply a condom catheter  HTN (hypertension) -NPO so holding home medications/Microzide -Hydralazine prn for uncontrolled blood pressure    Dehydration, moderate -IV fluids as above    HLD (hyperlipidemia) -NPO therefore Lipitor on hold    AAA (abdominal aortic aneurysm) -Incidental finding on CT today -Radiologist recommends repeat ultrasound imaging in 2 years     OSA on CPAP -Anticipate will be unable to achieve appropriate seal with NG tube in place so we will utilize oxygen at hour of sleep until NG tube removed      DVT prophylaxis: Lovenox Code Status: DO NOT RESUSCITATE Family Communication: Daughter at bedside Disposition Plan: Anticipate discharge back to preadmission home environment when medically stable Consults called: EDP spoke with general surgery/formal consultation was not obtained    ELLIS,ALLISON L. ANP-BC [facility] Pager [phone]   If 7PM-7AM, please contact night-coverage [email or web address] Password [email or web address]  06/10/2016, 4:22 PM

## 2016-06-11 ENCOUNTER — Inpatient Hospital Stay (HOSPITAL_COMMUNITY): Payer: Medicare Other

## 2016-06-11 DIAGNOSIS — K56609 Unspecified intestinal obstruction, unspecified as to partial versus complete obstruction: Secondary | ICD-10-CM

## 2016-06-11 LAB — COMPREHENSIVE METABOLIC PANEL
ALT: 30 U/L (ref 17–63)
AST: 62 U/L — ABNORMAL HIGH (ref 15–41)
Albumin: 3.4 g/dL — ABNORMAL LOW (ref 3.5–5.0)
Alkaline Phosphatase: 53 U/L (ref 38–126)
Anion gap: 9 (ref 5–15)
BUN: 34 mg/dL — ABNORMAL HIGH (ref 6–20)
CO2: 38 mmol/L — ABNORMAL HIGH (ref 22–32)
Calcium: 9.2 mg/dL (ref 8.9–10.3)
Chloride: 104 mmol/L (ref 101–111)
Creatinine, Ser: 1.3 mg/dL — ABNORMAL HIGH (ref 0.61–1.24)
GFR calc Af Amer: 60 mL/min — ABNORMAL LOW (ref >60)
GFR calc non Af Amer: 52 mL/min — ABNORMAL LOW (ref >60)
Glucose, Bld: 146 mg/dL — ABNORMAL HIGH (ref 65–99)
Potassium: 4.8 mmol/L (ref 3.5–5.1)
Sodium: 151 mmol/L — ABNORMAL HIGH (ref 135–145)
Total Bilirubin: 0.9 mg/dL (ref 0.3–1.2)
Total Protein: 6 g/dL — ABNORMAL LOW (ref 6.5–8.1)

## 2016-06-11 LAB — CBC
HCT: 52.4 % — ABNORMAL HIGH (ref 39.0–52.0)
Hemoglobin: 16.6 g/dL (ref 13.0–17.0)
MCH: 31.6 pg (ref 26.0–34.0)
MCHC: 31.7 g/dL (ref 30.0–36.0)
MCV: 99.8 fL (ref 78.0–100.0)
Platelets: 144 10*3/uL — ABNORMAL LOW (ref 150–400)
RBC: 5.25 MIL/uL (ref 4.22–5.81)
RDW: 14.2 % (ref 11.5–15.5)
WBC: 5.1 10*3/uL (ref 4.0–10.5)

## 2016-06-11 MED ORDER — DEXTROSE 5 % IV SOLN
INTRAVENOUS | Status: DC
  Administered 2016-06-11 – 2016-06-13 (×5): via INTRAVENOUS

## 2016-06-11 MED ORDER — PNEUMOCOCCAL VAC POLYVALENT 25 MCG/0.5ML IJ INJ
0.5000 mL | INJECTION | INTRAMUSCULAR | Status: AC
  Administered 2016-06-12: 0.5 mL via INTRAMUSCULAR
  Filled 2016-06-11: qty 0.5

## 2016-06-11 NOTE — Progress Notes (Signed)
Notified Schorr, NP that pt's NG came out. Pt refusing for it to be replaced till talking to MD in the morning. Will continue to monitor pt. Ranelle Oyster, RN

## 2016-06-11 NOTE — Progress Notes (Signed)
Patient ID: Jeremiah [name], male   DOB: 02/26/1941, 76 y.o.   MRN: [id number]    PROGRESS NOTE    Jeremiah [name]  TDD:[id number] DOB: 06-17-1940 DOA: 06/10/2016  PCP: Jeremiah Ly, MD   Brief Narrative:  76 y.o. male with hypertension, dyslipidemia and history of prostate cancer with prior radiation, previous hospitalization for bowel obstruction. Patient reports for the past 4 days he has had 2-3 high-volume emesis of bilious fluids. He's also had episodes of watery diarrhea. He has not had any flatus for 2 days. In ED, imaging consistent with small bowel obstruction.  Assessment & Plan:   Principal Problem:   Small bowel obstruction - abd less distended and non tender this AM - abd XRAY still with obstruction - keep NGT in place for now - surgery paged for consultation, awaiting call back - keep NPO for now - IV analgesia as needed - keep on IVF  Active Problems:   Dehydration, hemoconcentration - from SBO, vomiting, diarrhea - keep on IVF - reassess clinical status in AM     Hyponatremia - change IVF to D5 - BMP in AM    Acute kidney injury - from pre renal etiology - keep on IVF and repeat BMP in AM    Thrombocytopenia - mild and likely reactive - no signs of bleeding - CBC in AM    Malignant neoplasm of prostate s/p radiation 2015  DVT prophylaxis: Lovenox SQ Code Status: Full  Family Communication: Patient at bedside  Disposition Plan: Home when SBO resolved   Consultants:   None  Procedures:   NGT placed 1/20 -->  Antimicrobials:   None  Subjective: No events overnight.   Objective: Vitals:   06/10/16 1700 06/10/16 1801 06/10/16 2127 06/11/16 0459  BP: 146/77 (!) 142/78 135/63 122/75  Pulse: 91 95 94 95  Resp: 23 18 18 18   Temp:  98.4 F (36.9 C) 98.8 F (37.1 C) 98.2 F (36.8 C)  TempSrc:  Oral Oral Oral  SpO2: 94% 94% 99% 94%  Weight:      Height:        Intake/Output Summary (Last 24 hours) at 06/11/16 1304 Last data filed  at 06/11/16 0600  Gross per 24 hour  Intake                0 ml  Output             1025 ml  Net            -1025 ml   Filed Weights   06/10/16 0812  Weight: 88.5 kg (195 lb)    Examination:  General exam: Appears calm and comfortable  Respiratory system: Clear to auscultation. Respiratory effort normal. Cardiovascular system: S1 & S2 heard, RRR. No JVD, murmurs, rubs, gallops or clicks. No pedal edema. Gastrointestinal system: Abdomen is slightly distended, BS noted, non tender  Central nervous system: Alert and oriented. No focal neurological deficits. Extremities: Symmetric 5 x 5 power.  Data Reviewed: I have personally reviewed following labs and imaging studies  CBC:  Recent Labs Lab 06/09/16 2058 06/10/16 1334 06/11/16 0510  WBC 10.1 5.7 5.1  NEUTROABS  --  4.3  --   HGB 17.9* 17.7* 16.6  HCT 54.6* 53.7* 52.4*  MCV 97.2 97.1 99.8  PLT 142* 170 623*   Basic Metabolic Panel:  Recent Labs Lab 06/09/16 2058 06/10/16 1334 06/11/16 0510  NA 144 143 151*  K 3.7 3.6 4.8  CL 102 103 104  CO2 28 30 38*  GLUCOSE 168* 143* 146*  BUN 29* 39* 34*  CREATININE 1.16 1.31* 1.30*  CALCIUM 9.5 9.5 9.2   Liver Function Tests:  Recent Labs Lab 06/09/16 2058 06/10/16 1334 06/11/16 0510  AST 34 47* 62*  ALT 30 36 30  ALKPHOS 63 56 53  BILITOT 1.3* 1.2 0.9  PROT 6.7 6.5 6.0*  ALBUMIN 4.0 3.8 3.4*    Recent Labs Lab 06/09/16 2058  LIPASE 19   Urine analysis:    Component Value Date/Time   COLORURINE YELLOW 06/09/2016 2111   APPEARANCEUR CLEAR 06/09/2016 2111   LABSPEC 1.028 06/09/2016 2111   PHURINE 5.0 06/09/2016 2111   GLUCOSEU NEGATIVE 06/09/2016 2111   HGBUR NEGATIVE 06/09/2016 2111   BILIRUBINUR NEGATIVE 06/09/2016 2111   KETONESUR 5 (A) 06/09/2016 2111   PROTEINUR 30 (A) 06/09/2016 2111   UROBILINOGEN 1.0 08/25/2008 0326   NITRITE NEGATIVE 06/09/2016 2111   LEUKOCYTESUR NEGATIVE 06/09/2016 2111   Radiology Studies: Dg Chest 2 View  Result  Date: 06/10/2016 CLINICAL DATA:  Emesis with possible aspiration. EXAM: CHEST  2 VIEW COMPARISON:  Chest radiograph 05/27/2014 FINDINGS: Chronic elevation of right hemidiaphragm. There is adjacent right basilar atelectasis. Streaky atelectasis noted left lung base. Mild tortuosity of the thoracic aorta. Heart is at the upper limits of normal in size. No pulmonary edema or pleural fluid. No confluent airspace opacity. No acute osseous abnormality is seen. IMPRESSION: Chronic elevation of right hemidiaphragm. Streaky bibasilar opacities consistent with atelectasis, and this finding can be seen with aspiration. No confluent airspace disease. Electronically Signed   By: Jeb Levering M.D.   On: 06/10/2016 01:43   Ct Abdomen Pelvis W Contrast  Result Date: 06/10/2016 CLINICAL DATA:  Bloating, nausea, vomiting and diarrhea. EXAM: CT ABDOMEN AND PELVIS WITH CONTRAST TECHNIQUE: Multidetector CT imaging of the abdomen and pelvis was performed using the standard protocol following bolus administration of intravenous contrast. CONTRAST:  161mL ISOVUE-300 IOPAMIDOL (ISOVUE-300) INJECTION 61% COMPARISON:  08/25/2008 FINDINGS: Lower chest: No acute abnormality. Hepatobiliary: The liver is unremarkable. The gallbladder lumen demonstrates mild increased density dependently which may represent small layering gallstones. Pancreas: Unremarkable. No pancreatic ductal dilatation or surrounding inflammatory changes. Spleen: Normal in size without focal abnormality. Adrenals/Urinary Tract: Adrenal glands are unremarkable. Small nonobstructing calculi are present in the upper right kidney. The largest measures 5 mm. No hydronephrosis or renal masses. Bladder is unremarkable. Stomach/Bowel: There is evidence of gastric and small bowel distention. Dilated small bowel measures up to approximately 5 cm in greatest caliber. Relative transition between dilated and nondilated small bowel occurs in the right lower quadrant at roughly the  level of the distal jejunum. No evidence of bowel perforation, pneumatosis or focal abscess. The colon demonstrates diffuse diverticular disease without evidence of diverticulitis. No focal bowel lesions are identified by CT. 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 Aorta reaches maximal caliber of 3.2 x 3.4 cm and contains a mild amount of left-sided mural thrombus. Dilatation and is prior to the aortic bifurcation. No evidence of iliac aneurysmal disease. Visceral arteries appear normally patent. Reproductive: Interval brachytherapy seed placement within the prostate gland. Other: No ascites or hernias. 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 IMPRESSION: 1. Partial small bowel obstruction with dilated small bowel demonstrating relative transition in the right lower quadrant at the level of the distal jejunum. No evidence of associated bowel perforation or focal  abscess. 2. 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 The aorta reaches maximal caliber of 3.2 x 3.4 cm just below the IMA origin. Recommend followup by ultrasound in 2 years. This recommendation follows ACR consensus guidelines: White Paper of the ACR Incidental Findings Committee II on Vascular Findings. J Am Coll Radiol 2013; 10:789-794. Electronically Signed   By: Aletta Edouard M.D.   On: 06/10/2016 12:03   Dg Abd Portable 1v  Result Date: 06/11/2016 CLINICAL DATA:  NG tube placement EXAM: PORTABLE ABDOMEN - 1 VIEW COMPARISON:  06/10/2016 FINDINGS: Esophageal tube tip overlies the distal stomach as does the side port. Dilated loops of small bowel measuring up to 4.5 cm, overall decreased caliber of bowel distention. Scattered distal bowel gas. No pathologic calcifications. Contrast in the bladder. Posterior stabilization rods and  screws at the lumbosacral junction. IMPRESSION: 1. Esophageal tube tip overlies the distal stomach 2. Dilated loops of small bowel consistent with bowel obstruction, however decreased caliber of bowel distention compared to prior. Electronically Signed   By: Donavan Foil M.D.   On: 06/11/2016 04:10   Dg Abd Portable 1 View  Result Date: 06/10/2016 CLINICAL DATA:  Status post NG tube placement. EXAM: PORTABLE ABDOMEN - 1 VIEW COMPARISON:  CT abdomen and pelvis 06/10/2016 FINDINGS: An enteric tube has been placed with tip projecting in the region of the gastric cardia and side hole in the distal esophagus. Multiple loops of dilated small bowel are again seen, similar to the recent CT. Excreted IV contrast is present in the renal collecting systems and bladder. Evaluation for intraperitoneal free air is limited on this supine study. Brachytherapy seeds are noted in the prostate. Prior lower lumbar/ lumbosacral spinal fusion. Atelectasis in the lung bases. IMPRESSION: 1. Enteric tube the tip overlies the proximal stomach. Recommend advancing 10 cm which will also place the side hole in the stomach. 2. Similar appearance of small bowel dilatation consistent with obstruction as described on CT. Electronically Signed   By: Logan Bores M.D.   On: 06/10/2016 16:42    Scheduled Meds: . enoxaparin (LOVENOX) injection  40 mg Subcutaneous Q24H  . [START ON 06/12/2016] pneumococcal 23 valent vaccine  0.5 mL Intramuscular Tomorrow-1000   Continuous Infusions: . dextrose 5 %-0.9% nacl with kcl 100 mL/hr at 06/11/16 0103    LOS: 1 day   Time spent: 20 minutes   Faye Ramsay, MD [facility] Pager [phone]  If 7PM-7AM, please contact night-coverage [email or web address] Password [email or web address] 06/11/2016, 1:04 PM

## 2016-06-11 NOTE — Progress Notes (Signed)
Patient pulled NG tube partially out while using bedside commode, placement checked, notified MD and Xray verification ordered. Will continue to monitor

## 2016-06-12 LAB — BASIC METABOLIC PANEL
Anion gap: 7 (ref 5–15)
BUN: 32 mg/dL — ABNORMAL HIGH (ref 6–20)
CO2: 32 mmol/L (ref 22–32)
Calcium: 8.2 mg/dL — ABNORMAL LOW (ref 8.9–10.3)
Chloride: 106 mmol/L (ref 101–111)
Creatinine, Ser: 1.17 mg/dL (ref 0.61–1.24)
GFR calc Af Amer: >60 mL/min (ref >60)
GFR calc non Af Amer: 59 mL/min — ABNORMAL LOW (ref >60)
Glucose, Bld: 134 mg/dL — ABNORMAL HIGH (ref 65–99)
Potassium: 3.4 mmol/L — ABNORMAL LOW (ref 3.5–5.1)
Sodium: 145 mmol/L (ref 135–145)

## 2016-06-12 LAB — CBC
HCT: 46.7 % (ref 39.0–52.0)
Hemoglobin: 14.6 g/dL (ref 13.0–17.0)
MCH: 31.3 pg (ref 26.0–34.0)
MCHC: 31.3 g/dL (ref 30.0–36.0)
MCV: 100.2 fL — ABNORMAL HIGH (ref 78.0–100.0)
Platelets: 139 10*3/uL — ABNORMAL LOW (ref 150–400)
RBC: 4.66 MIL/uL (ref 4.22–5.81)
RDW: 14.2 % (ref 11.5–15.5)
WBC: 5.4 10*3/uL (ref 4.0–10.5)

## 2016-06-12 MED ORDER — POTASSIUM CHLORIDE CRYS ER 20 MEQ PO TBCR
40.0000 meq | EXTENDED_RELEASE_TABLET | Freq: Once | ORAL | Status: AC
  Administered 2016-06-12: 40 meq via ORAL
  Filled 2016-06-12: qty 2

## 2016-06-12 NOTE — Progress Notes (Addendum)
Patient ID: Jeremiah [name], male   DOB: [birth year], 76 y.o.   MRN: [id number]    PROGRESS NOTE    Jeremiah [name]  JOA:[id number] DOB: October 26, 1940 DOA: 06/10/2016  PCP: Jeremiah Ly, MD   Brief Narrative:  76 y.o. male with hypertension, dyslipidemia and history of prostate cancer with prior radiation, previous hospitalization for bowel obstruction. Patient reports for the past 4 days he has had 2-3 high-volume emesis of bilious fluids. He's also had episodes of watery diarrhea. He has not had any flatus for 2 days. In ED, imaging consistent with small bowel obstruction.  Assessment & Plan:   Principal Problem:   Small bowel obstruction - abd less distended and non tender this AM - abd XRAY notes decompressed dilated small bowel  - NGT came out, pt does not want it placed back again  - surgery consulted and said OK to keep NGT out and to advance diet to clears to see how pt does   Active Problems:   Dehydration, hemoconcentration - from SBO, vomiting, diarrhea - advanced diet to clears  - reassess clinical status in AM     Hyponatremia - changed IVF to D5 - resolved  - BMP in AM     Acute kidney injury - from pre renal etiology - resolved with IVF - BMP in AM    Thrombocytopenia - mild and likely reactive - no signs of bleeding - CBC in AM    Malignant neoplasm of prostate s/p radiation 2015  DVT prophylaxis: Lovenox SQ Code Status: DNR Family Communication: Patient and wife at bedside  Disposition Plan: Home when surgery team clears   Consultants:   None  Procedures:   NGT placed 1/20 --> out 1/21  Antimicrobials:   None  Subjective: No events overnight.   Objective: Vitals:   06/11/16 0459 06/11/16 1603 06/11/16 2140 06/12/16 0507  BP: 122/75 109/60 98/64 (!) 97/57  Pulse: 95 87 94 74  Resp: 18 16 18 18   Temp: 98.2 F (36.8 C) 99.6 F (37.6 C) 99.2 F (37.3 C) 99 F (37.2 C)  TempSrc: Oral     SpO2: 94% 95% 94% 95%  Weight:      Height:         Intake/Output Summary (Last 24 hours) at 06/12/16 1218 Last data filed at 06/12/16 0930  Gross per 24 hour  Intake           1112.5 ml  Output              100 ml  Net           1012.5 ml   Filed Weights   06/10/16 0812  Weight: 88.5 kg (195 lb)    Examination:  General exam: Appears calm and comfortable  Respiratory system: Clear to auscultation. Respiratory effort normal. Cardiovascular system: S1 & S2 heard, RRR. No JVD, murmurs, rubs, gallops or clicks. No pedal edema. Gastrointestinal system: Abdomen is slightly distended, BS noted, non tender  Central nervous system: Alert and oriented. No focal neurological deficits. Extremities: Symmetric 5 x 5 power.  Data Reviewed: I have personally reviewed following labs and imaging studies  CBC:  Recent Labs Lab 06/09/16 2058 06/10/16 1334 06/11/16 0510 06/12/16 0609  WBC 10.1 5.7 5.1 5.4  NEUTROABS  --  4.3  --   --   HGB 17.9* 17.7* 16.6 14.6  HCT 54.6* 53.7* 52.4* 46.7  MCV 97.2 97.1 99.8 100.2*  PLT 142* 170 144* 601*   Basic Metabolic Panel:  Recent Labs Lab 06/09/16 2058 06/10/16 1334 06/11/16 0510 06/12/16 0609  NA 144 143 151* 145  K 3.7 3.6 4.8 3.4*  CL 102 103 104 106  CO2 28 30 38* 32  GLUCOSE 168* 143* 146* 134*  BUN 29* 39* 34* 32*  CREATININE 1.16 1.31* 1.30* 1.17  CALCIUM 9.5 9.5 9.2 8.2*   Liver Function Tests:  Recent Labs Lab 06/09/16 2058 06/10/16 1334 06/11/16 0510  AST 34 47* 62*  ALT 30 36 30  ALKPHOS 63 56 53  BILITOT 1.3* 1.2 0.9  PROT 6.7 6.5 6.0*  ALBUMIN 4.0 3.8 3.4*    Recent Labs Lab 06/09/16 2058  LIPASE 19   Urine analysis:    Component Value Date/Time   COLORURINE YELLOW 06/09/2016 2111   APPEARANCEUR CLEAR 06/09/2016 2111   LABSPEC 1.028 06/09/2016 2111   PHURINE 5.0 06/09/2016 2111   GLUCOSEU NEGATIVE 06/09/2016 2111   HGBUR NEGATIVE 06/09/2016 2111   BILIRUBINUR NEGATIVE 06/09/2016 2111   KETONESUR 5 (A) 06/09/2016 2111   PROTEINUR 30 (A)  06/09/2016 2111   UROBILINOGEN 1.0 08/25/2008 0326   NITRITE NEGATIVE 06/09/2016 2111   LEUKOCYTESUR NEGATIVE 06/09/2016 2111   Radiology Studies: Dg Abd 2 Views  Result Date: 06/12/2016 CLINICAL DATA:  Nausea, vomiting and diarrhea. Nasogastric tube placed. EXAM: ABDOMEN - 2 VIEW COMPARISON:  06/11/2016 at 0035 hours FINDINGS: The tip of a gastric tube and side-port are seen in the left upper quadrant of the abdomen and appears to have been withdrawn several centimeters since prior exam. The tube is likely within proximal stomach at this point. Some interval decreasing gas-filled small bowel since prior exam. Dilated small bowel loops project over the liver shadow consistent with interposition of bowel over the liver. No free air. Posterior stabilization rods and screws at the lumbosacral junction. Two calcifications project within the right hemiabdomen possibly related to renal calculi, the larger of which measures 4 mm no. IMPRESSION: 1. Some interval decompression of dilated small bowel since prior exam. 2. Gastric tube appears to have been pulled back several cm since comparison study with tip and side port of the tube now in the left upper quadrant likely within proximal stomach. 3. Two punctate calcifications in the right hemiabdomen may reflect renal stones, the larger of which measures 4 mm. Electronically Signed   By: Ashley Royalty M.D.   On: 06/12/2016 03:20   Dg Abd Portable 1v  Result Date: 06/11/2016 CLINICAL DATA:  NG tube placement EXAM: PORTABLE ABDOMEN - 1 VIEW COMPARISON:  06/10/2016 FINDINGS: Esophageal tube tip overlies the distal stomach as does the side port. Dilated loops of small bowel measuring up to 4.5 cm, overall decreased caliber of bowel distention. Scattered distal bowel gas. No pathologic calcifications. Contrast in the bladder. Posterior stabilization rods and screws at the lumbosacral junction. IMPRESSION: 1. Esophageal tube tip overlies the distal stomach 2. Dilated  loops of small bowel consistent with bowel obstruction, however decreased caliber of bowel distention compared to prior. Electronically Signed   By: Donavan Foil M.D.   On: 06/11/2016 04:10   Dg Abd Portable 1 View  Result Date: 06/10/2016 CLINICAL DATA:  Status post NG tube placement. EXAM: PORTABLE ABDOMEN - 1 VIEW COMPARISON:  CT abdomen and pelvis 06/10/2016 FINDINGS: An enteric tube has been placed with tip projecting in the region of the gastric cardia and side hole in the distal esophagus. Multiple loops of dilated small bowel are again seen, similar to the recent CT. Excreted IV contrast is present  in the renal collecting systems and bladder. Evaluation for intraperitoneal free air is limited on this supine study. Brachytherapy seeds are noted in the prostate. Prior lower lumbar/ lumbosacral spinal fusion. Atelectasis in the lung bases. IMPRESSION: 1. Enteric tube the tip overlies the proximal stomach. Recommend advancing 10 cm which will also place the side hole in the stomach. 2. Similar appearance of small bowel dilatation consistent with obstruction as described on CT. Electronically Signed   By: Logan Bores M.D.   On: 06/10/2016 16:42    Scheduled Meds: . enoxaparin (LOVENOX) injection  40 mg Subcutaneous Q24H   Continuous Infusions: . dextrose 75 mL/hr at 06/12/16 0339    LOS: 2 days   Time spent: 20 minutes   Faye Ramsay, MD [facility] Pager [phone]  If 7PM-7AM, please contact night-coverage [email or web address] Password [facility] 06/12/2016, 12:18 PM

## 2016-06-12 NOTE — Consult Note (Signed)
[facility] Surgery Consult/Admission Note  Jeremiah [name] 02/15/1941  [id number].    Requesting MD: Dr. Mart Piggs Chief Complaint/Reason for Consult: SBO  HPI:   Jeremiah [name] is a 76 y.o. male with medical history significant for hypertension, dyslipidemia and history of prostate cancer with prior radiation in 2015. Patient also reports prior hospitalization for bowel obstruction in 2012. Patient reported 4 days of high-volume emesis of bilious fluids and watery diarrhea. Pt states he did not have any abdominal pain. Currently he denies abdominal pain, blood in his vomit or stool. He denies fever, chills, HA, LOC, CP, SOB. Pt has not had any nausea or vomiting in 24h since the NGT came out. He is still having bowel movements and having flatus.   ED Course:  VSS and labs unremarkable except BUN 39, creatinine 1.31 2 view chest x-ray: Streaky bibasilar opacities consistent with atelectasis without confluent airspace disease CT abdomen and pelvis with contrast: Small bowel obstruction with dilated small bowel with transition zone right lower quadrant at the level of the distal jejunum. No evidence of bowel perforation or abscess. 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 Recommendation is follow-up ultrasound in 2 years    ROS:  Review of Systems  Constitutional: Negative for chills, diaphoresis and fever.  HENT: Negative for sore throat.   Eyes: Negative for discharge.  Respiratory: Negative for shortness of breath.   Cardiovascular: Negative for chest pain.  Gastrointestinal: Positive for diarrhea, nausea and vomiting. Negative for abdominal pain, blood in stool and constipation.  Skin: Negative for rash.  Neurological: Negative for dizziness, loss of consciousness and headaches.  All other systems reviewed and are negative.    Family History  Problem Relation Age of Onset  . Heart attack Father   . Cancer Father     prostate  . Diabetes Father    . Cancer Paternal Uncle     prostate  . Lung cancer Brother     Past Medical History:  Diagnosis Date  . BPH (benign prostatic hyperplasia)   . Central retinal artery occlusion   . Diabetes mellitus without complication (HCC)    diet controlled  . Diverticulosis   . Hyperlipidemia   . Hypertension   . Osteoarthritis   . 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 S/P radiation therapy 04/23/13 - 05/29/14   Prostate/seminal vesicles, external beam 4500 cGy in 25 sessions  . Skin cancer    scalp    Past Surgical History:  Procedure Laterality Date  . BACK SURGERY  2009  . [place]  . PROSTATE BIOPSY  2013, 2014, 01/2014   Gleason 7  . RADIOACTIVE SEED IMPLANT N/A 07/01/2014   Procedure: RADIOACTIVE SEED IMPLANT;  Surgeon: [name], MD;  Location: [facility];  Service: Urology;  Laterality: N/A;  DR PORTABLE  . TONSILLECTOMY      Social History:  reports that he quit smoking about 55 years ago. He has never used smokeless tobacco. He reports that he drinks about 0.6 oz of alcohol per week . He reports that he does not use drugs.  Allergies: No Known Allergies  Medications Prior to Admission  Medication Sig Dispense Refill  . aspirin EC 81 MG tablet Take 81 mg by mouth.    [name] Kitchen atorvastatin (LIPITOR) 20 MG tablet Take 20 mg by mouth daily.   2  . azithromycin (ZITHROMAX) 500 MG tablet Take 250-500 mg by  mouth taper from 4 doses each day to 1 dose and stop.  0  . Cholecalciferol (VITAMIN D3) 1000 units CAPS Take 2,000 Units by mouth daily.     . hydrochlorothiazide (MICROZIDE) 12.5 MG capsule Take 12.5 mg by mouth daily.   0  . Multiple Vitamins-Minerals (PRESERVISION AREDS 2 PO) Take 1 tablet by mouth daily.     [name] Kitchen oseltamivir (TAMIFLU) 75 MG capsule Take 75 mg by mouth daily.  0  . potassium chloride (K-DUR) 10 MEQ tablet Take 20 mEq by mouth daily.   2  . tamsulosin (FLOMAX) 0.4 MG CAPS capsule Take 0.4 mg by mouth daily.        Blood pressure (!) 97/57, pulse 74, temperature 99 F (37.2 C), resp. rate 18, height 5' 10"  (1.778 m), weight 195 lb (88.5 kg), SpO2 95 %.  Physical Exam  Constitutional: He is oriented to person, place, and time and well-developed, well-nourished, and in no distress. No distress.  HENT:  Head: Normocephalic and atraumatic.  Eyes: Conjunctivae are normal. Right eye exhibits no discharge. Left eye exhibits no discharge. No scleral icterus.  Neck: Normal range of motion. Neck supple.  Cardiovascular: Normal rate.  Exam reveals no gallop and no friction rub.   No murmur heard. Pulmonary/Chest: Effort normal and breath sounds normal. No respiratory distress. He has no wheezes. He has no rales.  Abdominal: Soft. Normal appearance. There is no tenderness. There is no rebound.  Musculoskeletal: Normal range of motion.  Neurological: He is alert and oriented to person, place, and time. GCS score is 15.  Skin: Skin is warm and dry. He is not diaphoretic.  Psychiatric: Mood and affect normal.    Results for orders placed or performed during the hospital encounter of 06/10/16 (from the past 48 hour(s))  Comprehensive metabolic panel     Status: Abnormal   Collection Time: 06/10/16  1:34 PM  Result Value Ref Range   Sodium 143 135 - 145 mmol/L   Potassium 3.6 3.5 - 5.1 mmol/L   Chloride 103 101 - 111 mmol/L   CO2 30 22 - 32 mmol/L   Glucose, Bld 143 (H) 65 - 99 mg/dL   BUN 39 (H) 6 - 20 mg/dL   Creatinine, Ser 1.31 (H) 0.61 - 1.24 mg/dL   Calcium 9.5 8.9 - 10.3 mg/dL   Total Protein 6.5 6.5 - 8.1 g/dL   Albumin 3.8 3.5 - 5.0 g/dL   AST 47 (H) 15 - 41 U/L   ALT 36 17 - 63 U/L   Alkaline Phosphatase 56 38 - 126 U/L   Total Bilirubin 1.2 0.3 - 1.2 mg/dL   GFR calc non Af Amer 51 (L) >60 mL/min   GFR calc Af Amer 59 (L) >60 mL/min    Comment: (NOTE) The eGFR has been calculated using the CKD EPI equation. This calculation has not been validated in all clinical situations. eGFR's  persistently <60 mL/min signify possible Chronic Kidney Disease.    Anion gap 10 5 - 15  CBC with Differential     Status: Abnormal   Collection Time: 06/10/16  1:34 PM  Result Value Ref Range   WBC 5.7 4.0 - 10.5 K/uL   RBC 5.53 4.22 - 5.81 MIL/uL   Hemoglobin 17.7 (H) 13.0 - 17.0 g/dL   HCT 53.7 (H) 39.0 - 52.0 %   MCV 97.1 78.0 - 100.0 fL   MCH 32.0 26.0 - 34.0 pg   MCHC 33.0 30.0 - 36.0 g/dL   RDW  14.1 11.5 - 15.5 %   Platelets 170 150 - 400 K/uL   Neutrophils Relative % 75 %   Neutro Abs 4.3 1.7 - 7.7 K/uL   Lymphocytes Relative 14 %   Lymphs Abs 0.8 0.7 - 4.0 K/uL   Monocytes Relative 11 %   Monocytes Absolute 0.6 0.1 - 1.0 K/uL   Eosinophils Relative 0 %   Eosinophils Absolute 0.0 0.0 - 0.7 K/uL   Basophils Relative 0 %   Basophils Absolute 0.0 0.0 - 0.1 K/uL  Comprehensive metabolic panel     Status: Abnormal   Collection Time: 06/11/16  5:10 AM  Result Value Ref Range   Sodium 151 (H) 135 - 145 mmol/L   Potassium 4.8 3.5 - 5.1 mmol/L   Chloride 104 101 - 111 mmol/L   CO2 38 (H) 22 - 32 mmol/L   Glucose, Bld 146 (H) 65 - 99 mg/dL   BUN 34 (H) 6 - 20 mg/dL   Creatinine, Ser 1.30 (H) 0.61 - 1.24 mg/dL   Calcium 9.2 8.9 - 10.3 mg/dL   Total Protein 6.0 (L) 6.5 - 8.1 g/dL   Albumin 3.4 (L) 3.5 - 5.0 g/dL   AST 62 (H) 15 - 41 U/L   ALT 30 17 - 63 U/L   Alkaline Phosphatase 53 38 - 126 U/L   Total Bilirubin 0.9 0.3 - 1.2 mg/dL   GFR calc non Af Amer 52 (L) >60 mL/min   GFR calc Af Amer 60 (L) >60 mL/min    Comment: (NOTE) The eGFR has been calculated using the CKD EPI equation. This calculation has not been validated in all clinical situations. eGFR's persistently <60 mL/min signify possible Chronic Kidney Disease.    Anion gap 9 5 - 15  CBC     Status: Abnormal   Collection Time: 06/11/16  5:10 AM  Result Value Ref Range   WBC 5.1 4.0 - 10.5 K/uL   RBC 5.25 4.22 - 5.81 MIL/uL   Hemoglobin 16.6 13.0 - 17.0 g/dL   HCT 52.4 (H) 39.0 - 52.0 %   MCV 99.8 78.0  - 100.0 fL   MCH 31.6 26.0 - 34.0 pg   MCHC 31.7 30.0 - 36.0 g/dL   RDW 14.2 11.5 - 15.5 %   Platelets 144 (L) 150 - 400 K/uL  CBC     Status: Abnormal   Collection Time: 06/12/16  6:09 AM  Result Value Ref Range   WBC 5.4 4.0 - 10.5 K/uL   RBC 4.66 4.22 - 5.81 MIL/uL   Hemoglobin 14.6 13.0 - 17.0 g/dL   HCT 46.7 39.0 - 52.0 %   MCV 100.2 (H) 78.0 - 100.0 fL   MCH 31.3 26.0 - 34.0 pg   MCHC 31.3 30.0 - 36.0 g/dL   RDW 14.2 11.5 - 15.5 %   Platelets 139 (L) 150 - 400 K/uL  Basic metabolic panel     Status: Abnormal   Collection Time: 06/12/16  6:09 AM  Result Value Ref Range   Sodium 145 135 - 145 mmol/L   Potassium 3.4 (L) 3.5 - 5.1 mmol/L   Chloride 106 101 - 111 mmol/L   CO2 32 22 - 32 mmol/L   Glucose, Bld 134 (H) 65 - 99 mg/dL   BUN 32 (H) 6 - 20 mg/dL   Creatinine, Ser 1.17 0.61 - 1.24 mg/dL   Calcium 8.2 (L) 8.9 - 10.3 mg/dL   GFR calc non Af Amer 59 (L) >60 mL/min   GFR calc  Af Amer >60 >60 mL/min    Comment: (NOTE) The eGFR has been calculated using the CKD EPI equation. This calculation has not been validated in all clinical situations. eGFR's persistently <60 mL/min signify possible Chronic Kidney Disease.    Anion gap 7 5 - 15   Ct Abdomen Pelvis W Contrast  Result Date: 06/10/2016 CLINICAL DATA:  Bloating, nausea, vomiting and diarrhea. EXAM: CT ABDOMEN AND PELVIS WITH CONTRAST TECHNIQUE: Multidetector CT imaging of the abdomen and pelvis was performed using the standard protocol following bolus administration of intravenous contrast. CONTRAST:  151m ISOVUE-300 IOPAMIDOL (ISOVUE-300) INJECTION 61% COMPARISON:  08/25/2008 FINDINGS: Lower chest: No acute abnormality. Hepatobiliary: The liver is unremarkable. The gallbladder lumen demonstrates mild increased density dependently which may represent small layering gallstones. Pancreas: Unremarkable. No pancreatic ductal dilatation or surrounding inflammatory changes. Spleen: Normal in size without focal abnormality.  Adrenals/Urinary Tract: Adrenal glands are unremarkable. Small nonobstructing calculi are present in the upper right kidney. The largest measures 5 mm. No hydronephrosis or renal masses. Bladder is unremarkable. Stomach/Bowel: There is evidence of gastric and small bowel distention. Dilated small bowel measures up to approximately 5 cm in greatest caliber. Relative transition between dilated and nondilated small bowel occurs in the right lower quadrant at roughly the level of the distal jejunum. No evidence of bowel perforation, pneumatosis or focal abscess. The colon demonstrates diffuse diverticular disease without evidence of diverticulitis. No focal bowel lesions are identified by CT. 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 Aorta reaches maximal caliber of 3.2 x 3.4 cm and contains a mild amount of left-sided mural thrombus. Dilatation and is prior to the aortic bifurcation. No evidence of iliac aneurysmal disease. Visceral arteries appear normally patent. Reproductive: Interval brachytherapy seed placement within the prostate gland. Other: No ascites or hernias. 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 IMPRESSION: 1. Partial small bowel obstruction with dilated small bowel demonstrating relative transition in the right lower quadrant at the level of the distal jejunum. No evidence of associated bowel perforation or focal abscess. 2. 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 The aorta reaches maximal caliber of 3.2 x 3.4 cm just below the IMA origin. Recommend followup by ultrasound in 2 years. This recommendation follows ACR consensus guidelines: White Paper of the ACR Incidental Findings Committee II on Vascular Findings. J Am Coll Radiol 2013; 10:789-794. Electronically Signed   By: GAletta EdouardM.D.   On: 06/10/2016 12:03   Dg Abd 2 Views  Result Date: 06/12/2016 CLINICAL DATA:  Nausea, vomiting and diarrhea. Nasogastric tube placed. EXAM: ABDOMEN - 2 VIEW COMPARISON:  06/11/2016 at 0035 hours FINDINGS: The tip of a gastric tube and side-port are seen in the left upper quadrant of the abdomen and appears to have been withdrawn several centimeters since prior exam. The tube is likely within proximal stomach at this point. Some interval decreasing gas-filled small bowel since prior exam. Dilated small bowel loops project over the liver shadow consistent with interposition of bowel over the liver. No free air. Posterior stabilization rods and screws at the lumbosacral junction. Two calcifications project within the right hemiabdomen possibly related to renal calculi, the larger of which measures 4 mm no. IMPRESSION: 1. Some interval decompression of dilated small bowel since prior exam. 2. Gastric tube appears to have been pulled back several cm since comparison study with tip  and side port of the tube now in the left upper quadrant likely within proximal stomach. 3. Two punctate calcifications in the right hemiabdomen may reflect renal stones, the larger of which measures 4 mm. Electronically Signed   By: Ashley Royalty M.D.   On: 06/12/2016 03:20   Dg Abd Portable 1v  Result Date: 06/11/2016 CLINICAL DATA:  NG tube placement EXAM: PORTABLE ABDOMEN - 1 VIEW COMPARISON:  06/10/2016 FINDINGS: Esophageal tube tip overlies the distal stomach as does the side port. Dilated loops of small bowel measuring up to 4.5 cm, overall decreased caliber of bowel distention. Scattered distal bowel gas. No pathologic calcifications. Contrast in the bladder. Posterior stabilization rods and screws at the lumbosacral junction. IMPRESSION: 1. Esophageal tube tip overlies the distal stomach 2. Dilated loops of small bowel consistent with bowel obstruction, however decreased caliber of bowel distention compared to  prior. Electronically Signed   By: Donavan Foil M.D.   On: 06/11/2016 04:10   Dg Abd Portable 1 View  Result Date: 06/10/2016 CLINICAL DATA:  Status post NG tube placement. EXAM: PORTABLE ABDOMEN - 1 VIEW COMPARISON:  CT abdomen and pelvis 06/10/2016 FINDINGS: An enteric tube has been placed with tip projecting in the region of the gastric cardia and side hole in the distal esophagus. Multiple loops of dilated small bowel are again seen, similar to the recent CT. Excreted IV contrast is present in the renal collecting systems and bladder. Evaluation for intraperitoneal free air is limited on this supine study. Brachytherapy seeds are noted in the prostate. Prior lower lumbar/ lumbosacral spinal fusion. Atelectasis in the lung bases. IMPRESSION: 1. Enteric tube the tip overlies the proximal stomach. Recommend advancing 10 cm which will also place the side hole in the stomach. 2. Similar appearance of small bowel dilatation consistent with obstruction as described on CT. Electronically Signed   By: Logan Bores M.D.   On: 06/10/2016 16:42      Assessment/Plan   SBO - pt's NGT came out yesterday, he refused a new one - pt continues to have BM's and flatus during his hospital stay and pt did not have abdominal pain. Bloating has resolved. - No nausea or vomiting since NGT came out - abd xray today showed interval decompression of dilated small bowel compared to previous exams  Plan: Questionable SBO vs enteritis, clinically pt does not appear to be obstructed - benign abdominal exam. - clear liquids and advance diet as tolerated  We will continue to follow the pt. Thank you for the consult.   Kalman Drape, [facility] Surgery 06/12/2016, 10:13 AM Pager: [phone] Consults: [phone] Mon-Fri 7:00 am-4:30 pm Sat-Sun 7:00 am-11:30 am

## 2016-06-12 NOTE — Progress Notes (Signed)
Hooked NG tube back up to LIWS. Will continue to monitor pt. Ranelle Oyster, RN

## 2016-06-13 LAB — CBC
HCT: 45 % (ref 39.0–52.0)
Hemoglobin: 14.6 g/dL (ref 13.0–17.0)
MCH: 31.4 pg (ref 26.0–34.0)
MCHC: 32.4 g/dL (ref 30.0–36.0)
MCV: 96.8 fL (ref 78.0–100.0)
Platelets: 126 10*3/uL — ABNORMAL LOW (ref 150–400)
RBC: 4.65 MIL/uL (ref 4.22–5.81)
RDW: 13.6 % (ref 11.5–15.5)
WBC: 5.5 10*3/uL (ref 4.0–10.5)

## 2016-06-13 LAB — BASIC METABOLIC PANEL
Anion gap: 7 (ref 5–15)
BUN: 18 mg/dL (ref 6–20)
CO2: 32 mmol/L (ref 22–32)
Calcium: 8.3 mg/dL — ABNORMAL LOW (ref 8.9–10.3)
Chloride: 102 mmol/L (ref 101–111)
Creatinine, Ser: 1.05 mg/dL (ref 0.61–1.24)
GFR calc Af Amer: >60 mL/min (ref >60)
GFR calc non Af Amer: >60 mL/min (ref >60)
Glucose, Bld: 117 mg/dL — ABNORMAL HIGH (ref 65–99)
Potassium: 3.8 mmol/L (ref 3.5–5.1)
Sodium: 141 mmol/L (ref 135–145)

## 2016-06-13 NOTE — Progress Notes (Signed)
Patient ID: Jeremiah [name], male   DOB: [birth year], 76 y.o.   MRN: [id number]    PROGRESS NOTE    KEREM GILMER  WJX:[id number] DOB: [birth year] DOA: 06/10/2016  PCP: [name], MD   Brief Narrative:  76 y.o. male with hypertension, dyslipidemia and history of prostate cancer with prior radiation, previous hospitalization for bowel obstruction. Patient reports for the past 4 days he has had 2-3 high-volume emesis of bilious fluids. He's also had episodes of watery diarrhea. He has not had any flatus for 2 days. In ED, imaging consistent with small bowel obstruction.  Assessment & Plan:   Principal Problem:   Small bowel obstruction - abd less distended and non tender this AM - repeat abd XRAY notes decompressed dilated small bowel  - NGT came out, pt does not want it placed back again  - pt reports feeling better and would like diet advanced, will await surgery input   Active Problems:   Dehydration, hemoconcentration - from SBO, vomiting, diarrhea - advanced diet to clears, pt improving     Hyponatremia - resolved  - BMP in AM     Acute kidney injury - from pre renal etiology - resolved with IVF - BMP in AM    Thrombocytopenia - mild and likely reactive - no signs of bleeding - CBC in AM    Malignant neoplasm of prostate s/p radiation 2015  DVT prophylaxis: Lovenox SQ Code Status: DNR Family Communication: Patient and wife at bedside  Disposition Plan: Home when surgery team clears   Consultants:   None  Procedures:   NGT placed 1/20 --> out 1/21  Antimicrobials:   None  Subjective: No events overnight.   Objective: Vitals:   06/12/16 0507 06/12/16 1309 06/12/16 2135 06/13/16 0429  BP: (!) 97/57 125/75 131/81 123/75  Pulse: 74 78 77 71  Resp: 18  18 18   Temp: 99 F (37.2 C) 98.8 F (37.1 C) 98.6 F (37 C) 98.2 F (36.8 C)  TempSrc:  Oral Oral Oral  SpO2: 95% 96% 97% 95%  Weight:      Height:        Intake/Output Summary (Last 24  hours) at 06/13/16 1432 Last data filed at 06/13/16 0630  Gross per 24 hour  Intake           2337.5 ml  Output              300 ml  Net           2037.5 ml   Filed Weights   06/10/16 0812  Weight: 88.5 kg (195 lb)    Examination:  General exam: Appears calm and comfortable  Respiratory system: Clear to auscultation. Respiratory effort normal. Cardiovascular system: S1 & S2 heard, RRR. No JVD, murmurs, rubs, gallops or clicks. No pedal edema. Gastrointestinal system: Abdomen is slightly distended, BS noted, non tender  Central nervous system: Alert and oriented. No focal neurological deficits. Extremities: Symmetric 5 x 5 power.  Data Reviewed: I have personally reviewed following labs and imaging studies  CBC:  Recent Labs Lab 06/09/16 2058 06/10/16 1334 06/11/16 0510 06/12/16 0609 06/13/16 0802  WBC 10.1 5.7 5.1 5.4 5.5  NEUTROABS  --  4.3  --   --   --   HGB 17.9* 17.7* 16.6 14.6 14.6  HCT 54.6* 53.7* 52.4* 46.7 45.0  MCV 97.2 97.1 99.8 100.2* 96.8  PLT 142* 170 144* 139* 213*   Basic Metabolic Panel:  Recent Labs Lab 06/09/16 2058  06/10/16 1334 06/11/16 0510 06/12/16 0609 06/13/16 0802  NA 144 143 151* 145 141  K 3.7 3.6 4.8 3.4* 3.8  CL 102 103 104 106 102  CO2 28 30 38* 32 32  GLUCOSE 168* 143* 146* 134* 117*  BUN 29* 39* 34* 32* 18  CREATININE 1.16 1.31* 1.30* 1.17 1.05  CALCIUM 9.5 9.5 9.2 8.2* 8.3*   Liver Function Tests:  Recent Labs Lab 06/09/16 2058 06/10/16 1334 06/11/16 0510  AST 34 47* 62*  ALT 30 36 30  ALKPHOS 63 56 53  BILITOT 1.3* 1.2 0.9  PROT 6.7 6.5 6.0*  ALBUMIN 4.0 3.8 3.4*    Recent Labs Lab 06/09/16 2058  LIPASE 19   Urine analysis:    Component Value Date/Time   COLORURINE YELLOW 06/09/2016 2111   APPEARANCEUR CLEAR 06/09/2016 2111   LABSPEC 1.028 06/09/2016 2111   PHURINE 5.0 06/09/2016 2111   GLUCOSEU NEGATIVE 06/09/2016 2111   HGBUR NEGATIVE 06/09/2016 2111   BILIRUBINUR NEGATIVE 06/09/2016 2111    KETONESUR 5 (A) 06/09/2016 2111   PROTEINUR 30 (A) 06/09/2016 2111   UROBILINOGEN 1.0 08/25/2008 0326   NITRITE NEGATIVE 06/09/2016 2111   LEUKOCYTESUR NEGATIVE 06/09/2016 2111   Radiology Studies: Dg Abd 2 Views  Result Date: 06/12/2016 CLINICAL DATA:  Nausea, vomiting and diarrhea. Nasogastric tube placed. EXAM: ABDOMEN - 2 VIEW COMPARISON:  06/11/2016 at 0035 hours FINDINGS: The tip of a gastric tube and side-port are seen in the left upper quadrant of the abdomen and appears to have been withdrawn several centimeters since prior exam. The tube is likely within proximal stomach at this point. Some interval decreasing gas-filled small bowel since prior exam. Dilated small bowel loops project over the liver shadow consistent with interposition of bowel over the liver. No free air. Posterior stabilization rods and screws at the lumbosacral junction. Two calcifications project within the right hemiabdomen possibly related to renal calculi, the larger of which measures 4 mm no. IMPRESSION: 1. Some interval decompression of dilated small bowel since prior exam. 2. Gastric tube appears to have been pulled back several cm since comparison study with tip and side port of the tube now in the left upper quadrant likely within proximal stomach. 3. Two punctate calcifications in the right hemiabdomen may reflect renal stones, the larger of which measures 4 mm. Electronically Signed   By: Ashley Royalty M.D.   On: 06/12/2016 03:20    Scheduled Meds: . enoxaparin (LOVENOX) injection  40 mg Subcutaneous Q24H   Continuous Infusions: . dextrose 75 mL/hr at 06/13/16 0457    LOS: 3 days   Time spent: 20 minutes   Faye Ramsay, MD [facility] Pager [phone]  If 7PM-7AM, please contact night-coverage [email or web address] Password [facility] 06/13/2016, 2:32 PM

## 2016-06-14 ENCOUNTER — Inpatient Hospital Stay (HOSPITAL_COMMUNITY): Payer: Medicare Other

## 2016-06-14 LAB — CBC
HCT: 43.9 % (ref 39.0–52.0)
Hemoglobin: 14.4 g/dL (ref 13.0–17.0)
MCH: 31 pg (ref 26.0–34.0)
MCHC: 32.8 g/dL (ref 30.0–36.0)
MCV: 94.6 fL (ref 78.0–100.0)
Platelets: 124 10*3/uL — ABNORMAL LOW (ref 150–400)
RBC: 4.64 MIL/uL (ref 4.22–5.81)
RDW: 13 % (ref 11.5–15.5)
WBC: 5.8 10*3/uL (ref 4.0–10.5)

## 2016-06-14 LAB — BASIC METABOLIC PANEL
Anion gap: 9 (ref 5–15)
BUN: 14 mg/dL (ref 6–20)
CO2: 31 mmol/L (ref 22–32)
Calcium: 8.5 mg/dL — ABNORMAL LOW (ref 8.9–10.3)
Chloride: 100 mmol/L — ABNORMAL LOW (ref 101–111)
Creatinine, Ser: 1.03 mg/dL (ref 0.61–1.24)
GFR calc Af Amer: >60 mL/min (ref >60)
GFR calc non Af Amer: >60 mL/min (ref >60)
Glucose, Bld: 117 mg/dL — ABNORMAL HIGH (ref 65–99)
Potassium: 3.4 mmol/L — ABNORMAL LOW (ref 3.5–5.1)
Sodium: 140 mmol/L (ref 135–145)

## 2016-06-14 MED ORDER — POTASSIUM CHLORIDE CRYS ER 20 MEQ PO TBCR
40.0000 meq | EXTENDED_RELEASE_TABLET | Freq: Once | ORAL | Status: AC
  Administered 2016-06-14: 40 meq via ORAL
  Filled 2016-06-14: qty 2

## 2016-06-14 MED ORDER — POTASSIUM CHLORIDE CRYS ER 20 MEQ PO TBCR
20.0000 meq | EXTENDED_RELEASE_TABLET | Freq: Once | ORAL | Status: AC
  Administered 2016-06-14: 20 meq via ORAL
  Filled 2016-06-14: qty 1

## 2016-06-14 NOTE — Progress Notes (Signed)
Protrusion noted on patients abdomen and patient states "no one ever told me and I never noticed it before". MD paged to make aware of possible hernia and patient's concern for it. Patient denies any pain.

## 2016-06-14 NOTE — Progress Notes (Signed)
Patient ID: Jeremiah [name], male   DOB: 19-Oct-1940, 76 y.o.   MRN: [id number]    PROGRESS NOTE    Jeremiah [name]  MBT:[id number] DOB: [birth year] DOA: 06/10/2016  PCP: [name], MD   Brief Narrative:  76 y.o. male with hypertension, dyslipidemia and history of prostate cancer with prior radiation, previous hospitalization for bowel obstruction. Patient reports for the past 4 days he has had 2-3 high-volume emesis of bilious fluids. He's also had episodes of watery diarrhea. He has not had any flatus for 2 days. In ED, imaging consistent with small bowel obstruction.  Assessment & Plan:   Principal Problem:   Small bowel obstruction - abd less distended and non tender this AM - repeat abd XRAY notes decompressed dilated small bowel  - NGT came out, pt does not want it placed back again  - pt reports feeling better and would like diet advanced, soft diet ordered - if pt tolerating well and repeat ABX XRAY stable, can likely go home in AM  Active Problems:   Dehydration, hemoconcentration - from SBO, vomiting, diarrhea - advanced diet to clears, pt improving     Hyponatremia - resolved  - BMP in AM     Hypokalemia - mild, supplement and repeat BMP in AM    Acute kidney injury - from pre renal etiology - resolved with IVF - BMP in AM    Thrombocytopenia - mild and likely reactive - no signs of bleeding - CBC in AM    Malignant neoplasm of prostate s/p radiation 2015  DVT prophylaxis: Lovenox SQ Code Status: DNR Family Communication: Patient and wife at bedside  Disposition Plan: Home when surgery team clears   Consultants:   None  Procedures:   NGT placed 1/20 --> out 1/21  Antimicrobials:   None  Subjective: No events overnight.   Objective: Vitals:   06/13/16 2250 06/14/16 0628 06/14/16 0854 06/14/16 1407  BP:  127/68  123/74  Pulse: 78 71  80  Resp: 20 18  20   Temp: 99.7 F (37.6 C) 99 F (37.2 C)  98.9 F (37.2 C)  TempSrc: Oral Oral   Oral  SpO2: 94% 98% 94% 94%  Weight:      Height:        Intake/Output Summary (Last 24 hours) at 06/14/16 1633 Last data filed at 06/14/16 1400  Gross per 24 hour  Intake             1880 ml  Output             1190 ml  Net              690 ml   Filed Weights   06/10/16 0812  Weight: 88.5 kg (195 lb)    Examination:  General exam: Appears calm and comfortable  Respiratory system: Clear to auscultation. Respiratory effort normal. Cardiovascular system: S1 & S2 heard, RRR. No JVD, murmurs, rubs, gallops or clicks. No pedal edema. Gastrointestinal system: Abdomen is slightly distended, BS noted, non tender  Central nervous system: Alert and oriented. No focal neurological deficits. Extremities: Symmetric 5 x 5 power.  Data Reviewed: I have personally reviewed following labs and imaging studies  CBC:  Recent Labs Lab 06/10/16 1334 06/11/16 0510 06/12/16 0609 06/13/16 0802 06/14/16 0629  WBC 5.7 5.1 5.4 5.5 5.8  NEUTROABS 4.3  --   --   --   --   HGB 17.7* 16.6 14.6 14.6 14.4  HCT 53.7* 52.4* 46.7 45.0  43.9  MCV 97.1 99.8 100.2* 96.8 94.6  PLT 170 144* 139* 126* 818*   Basic Metabolic Panel:  Recent Labs Lab 06/10/16 1334 06/11/16 0510 06/12/16 0609 06/13/16 0802 06/14/16 0629  NA 143 151* 145 141 140  K 3.6 4.8 3.4* 3.8 3.4*  CL 103 104 106 102 100*  CO2 30 38* 32 32 31  GLUCOSE 143* 146* 134* 117* 117*  BUN 39* 34* 32* 18 14  CREATININE 1.31* 1.30* 1.17 1.05 1.03  CALCIUM 9.5 9.2 8.2* 8.3* 8.5*   Liver Function Tests:  Recent Labs Lab 06/09/16 2058 06/10/16 1334 06/11/16 0510  AST 34 47* 62*  ALT 30 36 30  ALKPHOS 63 56 53  BILITOT 1.3* 1.2 0.9  PROT 6.7 6.5 6.0*  ALBUMIN 4.0 3.8 3.4*    Recent Labs Lab 06/09/16 2058  LIPASE 19   Urine analysis:    Component Value Date/Time   COLORURINE YELLOW 06/09/2016 2111   APPEARANCEUR CLEAR 06/09/2016 2111   LABSPEC 1.028 06/09/2016 2111   PHURINE 5.0 06/09/2016 2111   GLUCOSEU NEGATIVE  06/09/2016 2111   HGBUR NEGATIVE 06/09/2016 2111   BILIRUBINUR NEGATIVE 06/09/2016 2111   KETONESUR 5 (A) 06/09/2016 2111   PROTEINUR 30 (A) 06/09/2016 2111   UROBILINOGEN 1.0 08/25/2008 0326   NITRITE NEGATIVE 06/09/2016 2111   LEUKOCYTESUR NEGATIVE 06/09/2016 2111   Radiology Studies: Pending abd xray  Scheduled Meds: . enoxaparin (LOVENOX) injection  40 mg Subcutaneous Q24H   Continuous Infusions:   LOS: 4 days   Time spent: 20 minutes   Faye Ramsay, MD [facility] Pager [phone]  If 7PM-7AM, please contact night-coverage [email or web address] Password [facility] 06/14/2016, 4:33 PM

## 2016-06-14 NOTE — Care Management Important Message (Signed)
Important Message  Patient Details  Name: Jeremiah [name] MRN: [id number] Date of Birth: Sep 22, 1940   Medicare Important Message Given:  Yes    Nathen May 06/14/2016, 1:44 PM

## 2016-06-15 ENCOUNTER — Inpatient Hospital Stay (HOSPITAL_COMMUNITY): Payer: Medicare Other

## 2016-06-15 LAB — CBC
HCT: 47 % (ref 39.0–52.0)
Hemoglobin: 15.6 g/dL (ref 13.0–17.0)
MCH: 31.5 pg (ref 26.0–34.0)
MCHC: 33.2 g/dL (ref 30.0–36.0)
MCV: 94.9 fL (ref 78.0–100.0)
Platelets: 136 10*3/uL — ABNORMAL LOW (ref 150–400)
RBC: 4.95 MIL/uL (ref 4.22–5.81)
RDW: 13.1 % (ref 11.5–15.5)
WBC: 8 10*3/uL (ref 4.0–10.5)

## 2016-06-15 LAB — BASIC METABOLIC PANEL
Anion gap: 7 (ref 5–15)
BUN: 21 mg/dL — ABNORMAL HIGH (ref 6–20)
CO2: 27 mmol/L (ref 22–32)
Calcium: 8.8 mg/dL — ABNORMAL LOW (ref 8.9–10.3)
Chloride: 107 mmol/L (ref 101–111)
Creatinine, Ser: 1.01 mg/dL (ref 0.61–1.24)
GFR calc Af Amer: >60 mL/min (ref >60)
GFR calc non Af Amer: >60 mL/min (ref >60)
Glucose, Bld: 126 mg/dL — ABNORMAL HIGH (ref 65–99)
Potassium: 3.9 mmol/L (ref 3.5–5.1)
Sodium: 141 mmol/L (ref 135–145)

## 2016-06-15 MED ORDER — OXYCODONE HCL 5 MG PO TABS: 5.0000 mg | ORAL_TABLET | ORAL | 0 refills | Status: DC | PRN

## 2016-06-15 NOTE — Evaluation (Signed)
Physical Therapy Evaluation & Discharge Patient Details Name: Jeremiah [name] MRN: [id number] DOB: [birth year] Today's Date: 06/15/2016   History of Present Illness  76 y.o. male with hypertension, dyslipidemia and history of prostate cancer with prior radiation, previous hospitalization for bowel obstruction. Patient reports for the past 4 days he has had 2-3 high-volume emesis of bilious fluids. He's also had episodes of watery diarrhea. He has not had any flatus for 2 days. In ED, imaging consistent with small bowel obstruction.  Clinical Impression  Patient presents with decreased balance and gait with history of falls.  Reports, however, not far from recent baseline.  Feel he will benefit from outpatient PT at neurorehab center for balance and gait training.  Pt likely to d/c home today so no continued acute level PT at this time.     Follow Up Recommendations Outpatient PT (for balance and gait training at outpatient neurorehab)    Equipment Recommendations  None recommended by PT    Recommendations for Other Services       Precautions / Restrictions Precautions Precautions: Fall Precaution Comments: 2 falls in 6 months Restrictions Weight Bearing Restrictions: No      Mobility  Bed Mobility               General bed mobility comments: up in chair  Transfers Overall transfer level: Modified independent                  Ambulation/Gait Ambulation/Gait assistance: Supervision Ambulation Distance (Feet): 200 Feet (and 400' with RW) Assistive device: Rolling walker (2 wheeled);None Gait Pattern/deviations: Step-through pattern;Shuffle;Decreased stride length;Wide base of support;Trunk flexed     General Gait Details: stays flexed at hips throughout, cues for posture with RW, for proximity, and for forward gaze, short shuffling pattern and keeping L side retracted throughout.    Stairs            Wheelchair Mobility    Modified Rankin (Stroke  Patients Only)       Balance Overall balance assessment: Needs assistance   Sitting balance-Leahy Scale: Good       Standing balance-Leahy Scale: Good           Rhomberg - Eyes Opened: 30     High Level Balance Comments: feet apart eyes closed 10 sec no assist, picked up item from floor safely, forward reach at least 10 inches safely, turning in circle slowly but safely             Pertinent Vitals/Pain Pain Assessment: No/denies pain    Home Living Family/patient expects to be discharged to:: Private residence Living Arrangements: Spouse/significant other Available Help at Discharge: Family;Available 24 hours/day Type of Home: House Home Access: Stairs to enter   [employer] of Steps: 1 Home Layout: Two level;Laundry or work area in basement;Able to live on main level with bedroom/bathroom Home Equipment: Civil engineer, contracting - built in;Grab bars - tub/shower;Hand held Tourist information centre manager - 2 wheels      Prior Function Level of Independence: Independent         Comments: has fallen twice in past 6 months once foot hung under a chair, and other time walking in parking lot and lost balance walking between curb and pavement     Hand Dominance   Dominant Hand: Right    Extremity/Trunk Assessment   Upper Extremity Assessment Upper Extremity Assessment: Overall WFL for tasks assessed    Lower Extremity Assessment Lower Extremity Assessment: Overall WFL for tasks assessed  Cervical / Trunk Assessment Cervical / Trunk Assessment: Lordotic  Communication   Communication: No difficulties  Cognition Arousal/Alertness: Awake/alert Behavior During Therapy: WFL for tasks assessed/performed Overall Cognitive Status: Within Functional Limits for tasks assessed                      General Comments General comments (skin integrity, edema, etc.): reports history of falling over if sitting on the floor or ground and has difficulty getting back up,  back surgery years ago with good recovery, but symptoms of falling over began afterwards    Exercises     Assessment/Plan    PT Assessment All further PT needs can be met in the next venue of care  PT Problem List Decreased balance;Decreased mobility;Decreased knowledge of use of DME;Decreased safety awareness          PT Treatment Interventions      PT Goals (Current goals can be found in the Care Plan section)  Acute Rehab PT Goals PT Goal Formulation: All assessment and education complete, DC therapy    Frequency     Barriers to discharge        Co-evaluation               End of Session   Activity Tolerance: Patient tolerated treatment well Patient left: in chair;with call bell/phone within reach           Time: 0855-0927 PT Time Calculation (min) (ACUTE ONLY): 32 min   Charges:   PT Evaluation $PT Eval Moderate Complexity: 1 Procedure PT Treatments $Gait Training: 8-22 mins   PT G CodesReginia Naas July 06, 2016, 9:34 AM Magda Kiel, [place] 07-06-2016

## 2016-06-15 NOTE — Progress Notes (Signed)
Jeremiah [name] to be D/C'd Home per MD order.  Discussed with the patient and all questions fully answered.  Allergies as of 06/15/2016   No Known Allergies     Medication List    STOP taking these medications   azithromycin 500 MG tablet Commonly known as:  ZITHROMAX   oseltamivir 75 MG capsule Commonly known as:  TAMIFLU     TAKE these medications   aspirin EC 81 MG tablet Take 81 mg by mouth.   atorvastatin 20 MG tablet Commonly known as:  LIPITOR Take 20 mg by mouth daily.   hydrochlorothiazide 12.5 MG capsule Commonly known as:  MICROZIDE Take 12.5 mg by mouth daily.   oxyCODONE 5 MG immediate release tablet Commonly known as:  Oxy IR/ROXICODONE Take 1 tablet (5 mg total) by mouth every 4 (four) hours as needed for severe pain.   potassium chloride 10 MEQ tablet Commonly known as:  K-DUR Take 20 mEq by mouth daily.   PRESERVISION AREDS 2 PO Take 1 tablet by mouth daily.   tamsulosin 0.4 MG Caps capsule Commonly known as:  FLOMAX Take 0.4 mg by mouth daily.   Vitamin D3 1000 units Caps Take 2,000 Units by mouth daily.       VVS, Skin clean, dry and intact without evidence of skin break down, no evidence of skin tears noted. IV catheter discontinued intact. Site without signs and symptoms of complications. Dressing and pressure applied.  An After Visit Summary was printed and given to the patient. Patient escorted via [place], and D/C home via private auto.  Jeremiah [name] D 06/15/2016 3:48 PM

## 2016-06-15 NOTE — Progress Notes (Signed)
Patient complaint of increased abdominal bloating. No pain. Tolerating food. MD stated to hold d/c and she will order an xray. If xray okay then patient can discharge home alter.

## 2016-06-15 NOTE — Discharge Instructions (Signed)
Ileus Introduction Ileus is a condition in which the intestines, also called the bowels, stop working and moving correctly. If the intestines stop working, food cannot pass through to get digested. The intestines are hollow organs that digest food after the food leaves the stomach. These organs are long, muscular tubes that connect the stomach to the rectum. When ileus occurs, the muscular contractions that cause food to move through the intestines stop happening as they normally would. Ileus can occur for various reasons. This condition is a serious problem that usually requires hospitalization. It can cause symptoms such as nausea, abdominal pain, and bloating. Ileus can last from a few hours to a few days. If the intestines stop working because of a blockage, that is a different condition that is called a bowel obstruction. What are the causes? This condition may be caused by:  Surgery on the abdomen.  An infection or inflammation in the abdomen. This includes inflammation of the lining of the abdomen (peritonitis).  Infection or inflammation in other parts of the body, such as pneumonia or pancreatitis.  Passage of gallstones or kidney stones.  Damage to the nerves or blood vessels that go to the intestines.  A collection of blood within the abdominal cavity.  Imbalance in the salts in the blood (electrolytes).  Injury to the brain or spinal cord.  Medicines. Many medicines, including strong pain medicines, can cause ileus or make it worse. What are the signs or symptoms? Symptoms of this condition include:  Bloating of the abdomen.  Pain or discomfort in the abdomen.  Poor appetite.  Nausea and vomiting.  Lack of normal bowel sounds, such as growling" in the stomach. How is this diagnosed? This condition may be diagnosed with:  A physical exam and medical history.  X-rays or a CT scan of the abdomen. You may also have other tests to help find the cause of the  condition. How is this treated? Treatment for this condition may include:  Resting the intestines until they start to work again. This is often done by:  Stopping oral intake of food and drink. You will be given fluid through an IV tube to prevent dehydration.  Placing a small tube (nasogastric tube or NG tube) that is passed through your nose and into your stomach. The tube is attached to a suction device and keeps the stomach emptied out. This allows the bowels to rest and also helps to reduce nausea and vomiting.  Correcting any electrolyte imbalance by giving supplements in the IV fluid.  Stopping any medicines that might make ileus worse.  Treating any condition that may have caused ileus. Follow these instructions at home:  Follow instructions from your health care provider about diet and fluid intake. Usually, you will be told to:  Drink plenty of clear fluids.  Avoid alcohol.  Avoid caffeine.  Eat a bland diet.  Get plenty of rest. Return to your normal activities as told by your health care provider.  Take over-the-counter and prescription medicines only as told by your health care provider.  Keep all follow-up visits as told by your health care provider. This is important. Contact a health care provider if:  You have nausea, vomiting, or abdominal discomfort.  You have a fever. Get help right away if:  You have severe abdominal pain or bloating.  You cannot eat or drink without vomiting. This information is not intended to replace advice given to you by your health care provider. Make sure you discuss any questions  you have with your health care provider. Document Released: 05/11/2003 Document Revised: 10/14/2015 Document Reviewed: 07/02/2014  2017 Elsevier   Small Bowel Obstruction A small bowel obstruction means that something is blocking the small bowel. The small bowel is also called the small intestine. It is the long tube that connects the stomach to  the colon. An obstruction will stop food and fluids from passing through the small bowel. Treatment depends on what is causing the problem and how bad the problem is. Follow these instructions at home:  Get a lot of rest.  Follow your diet as told by your doctor. You may need to:  Only drink clear liquids until you start to get better.  Avoid solid foods as told by your doctor.  Take over-the-counter and prescription medicines only as told by your doctor.  Keep all follow-up visits as told by your doctor. This is important. Contact a doctor if:  You have a fever.  You have chills. Get help right away if:  You have pain or cramps that get worse.  You throw up (vomit) blood.  You have a feeling of being sick to your stomach (nausea) that does not go away.  You cannot stop throwing up.  You cannot drink fluids.  You feel confused.  You feel dry or thirsty (dehydrated).  Your belly gets more bloated.  You feel weak or you pass out (faint). This information is not intended to replace advice given to you by your health care provider. Make sure you discuss any questions you have with your health care provider. Document Released: 06/15/2004 Document Revised: 01/03/2016 Document Reviewed: 07/02/2014 Elsevier Interactive Patient Education  2017 [employer].

## 2016-06-15 NOTE — Discharge Summary (Signed)
Physician Discharge Summary  Jeremiah [name] UXL:[id number] DOB: 01-09-1941 DOA: 06/10/2016  PCP: [name], MD  Admit date: 06/10/2016 Discharge date: 06/15/2016  Recommendations for Outpatient Follow-up:  1. Pt will need to follow up with PCP in 1-2 weeks post discharge 2. Please obtain BMP to evaluate electrolytes and kidney function 3. Please also check CBC to evaluate Hg and Hct levels  Discharge Diagnoses:  Principal Problem:   Small bowel obstruction Active Problems:   Malignant neoplasm of prostate s/p radiation 2015   HLD (hyperlipidemia)  Discharge Condition: Stable  Diet recommendation: Heart healthy diet discussed in details   Brief Narrative:  76 y.o.malewith hypertension, dyslipidemia and history of prostate cancer with prior radiation, previous hospitalization for bowel obstruction. Patient reports for the past 4 days he has had 2-3 high-volume emesis of bilious fluids. He's also had episodes of watery diarrhea. He has not had any flatus for 2 days. In ED, imaging consistent with small bowel obstruction.  Assessment & Plan:   Principal Problem:   Small bowel obstruction - abd non distended and non tender this AM - repeat abd XRAY notes decompressed dilated small bowel, SBO resolved  - pt tolerating regular diet well and wants to go home  - surgery team cleared for discharge   Active Problems:   Dehydration, hemoconcentration - from SBO, vomiting, diarrhea - resolved     Hyponatremia - resolved     Hypokalemia - mild, supplemented prior to discharge     Acute kidney injury - from pre renal etiology - resolved with IVF    Thrombocytopenia - mild and likely reactive - no signs of bleeding    Malignant neoplasm of prostate s/p radiation 2015  DVT prophylaxis: Lovenox SQ Code Status: DNR Family Communication: Patient and wife at bedside  Disposition Plan: Home   Consultants:   None  Procedures:   NGT placed 1/20 --> out  1/21  Antimicrobials:   None  Procedures/Studies: Dg Chest 2 View  Result Date: 06/10/2016 CLINICAL DATA:  Emesis with possible aspiration. EXAM: CHEST  2 VIEW COMPARISON:  Chest radiograph 05/27/2014 FINDINGS: Chronic elevation of right hemidiaphragm. There is adjacent right basilar atelectasis. Streaky atelectasis noted left lung base. Mild tortuosity of the thoracic aorta. Heart is at the upper limits of normal in size. No pulmonary edema or pleural fluid. No confluent airspace opacity. No acute osseous abnormality is seen. IMPRESSION: Chronic elevation of right hemidiaphragm. Streaky bibasilar opacities consistent with atelectasis, and this finding can be seen with aspiration. No confluent airspace disease. Electronically Signed   By: Jeb Levering M.D.   On: 06/10/2016 01:43   Ct Abdomen Pelvis W Contrast  Result Date: 06/10/2016 CLINICAL DATA:  Bloating, nausea, vomiting and diarrhea. EXAM: CT ABDOMEN AND PELVIS WITH CONTRAST TECHNIQUE: Multidetector CT imaging of the abdomen and pelvis was performed using the standard protocol following bolus administration of intravenous contrast. CONTRAST:  120mL ISOVUE-300 IOPAMIDOL (ISOVUE-300) INJECTION 61% COMPARISON:  08/25/2008 FINDINGS: Lower chest: No acute abnormality. Hepatobiliary: The liver is unremarkable. The gallbladder lumen demonstrates mild increased density dependently which may represent small layering gallstones. Pancreas: Unremarkable. No pancreatic ductal dilatation or surrounding inflammatory changes. Spleen: Normal in size without focal abnormality. Adrenals/Urinary Tract: Adrenal glands are unremarkable. Small nonobstructing calculi are present in the upper right kidney. The largest measures 5 mm. No hydronephrosis or renal masses. Bladder is unremarkable. Stomach/Bowel: There is evidence of gastric and small bowel distention. Dilated small bowel measures up to approximately 5 cm in greatest caliber. Relative transition  between  dilated and nondilated small bowel occurs in the right lower quadrant at roughly the level of the distal jejunum. No evidence of bowel perforation, pneumatosis or focal abscess. The colon demonstrates diffuse diverticular disease without evidence of diverticulitis. No focal bowel lesions are identified by CT. 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 Aorta reaches maximal caliber of 3.2 x 3.4 cm and contains a mild amount of left-sided mural thrombus. Dilatation and is prior to the aortic bifurcation. No evidence of iliac aneurysmal disease. Visceral arteries appear normally patent. Reproductive: Interval brachytherapy seed placement within the prostate gland. Other: No ascites or hernias. 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 IMPRESSION: 1. Partial small bowel obstruction with dilated small bowel demonstrating relative transition in the right lower quadrant at the level of the distal jejunum. No evidence of associated bowel perforation or focal abscess. 2. 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 The aorta reaches maximal caliber of 3.2 x 3.4 cm just below the IMA origin. Recommend followup by ultrasound in 2 years. This recommendation follows ACR consensus guidelines: White Paper of the ACR Incidental Findings Committee II on Vascular Findings. J Am Coll Radiol 2013; 10:789-794. Electronically Signed   By: Aletta Edouard M.D.   On: 06/10/2016 12:03   Dg Abd 2 Views  Result Date: 06/14/2016 CLINICAL DATA:  Reason for exam: Ileus; pt has been vomiting with diarrhea for the past week. Medical hx: cancer, diabetes, hypertension. EXAM: ABDOMEN - 2 VIEW COMPARISON:  06/11/2016 FINDINGS: Upright and supine views. The upright view demonstrates no free intraperitoneal air. Right hemidiaphragm elevation.  Small bowel mid abdominal air-fluid levels are improved. Supine images demonstrate radiation seeds in the prostate. Resolution of small bowel gaseous distension. Distal gas. No abnormal abdominal calcifications. Right nephrolithiasis. IMPRESSION: resolution of small bowel dilatation with minimal nonspecific small bowel air-fluid levels remaining. Right nephrolithiasis. Electronically Signed   By: Abigail Miyamoto M.D.   On: 06/14/2016 10:51   Dg Abd 2 Views  Result Date: 06/12/2016 CLINICAL DATA:  Nausea, vomiting and diarrhea. Nasogastric tube placed. EXAM: ABDOMEN - 2 VIEW COMPARISON:  06/11/2016 at 0035 hours FINDINGS: The tip of a gastric tube and side-port are seen in the left upper quadrant of the abdomen and appears to have been withdrawn several centimeters since prior exam. The tube is likely within proximal stomach at this point. Some interval decreasing gas-filled small bowel since prior exam. Dilated small bowel loops project over the liver shadow consistent with interposition of bowel over the liver. No free air. Posterior stabilization rods and screws at the lumbosacral junction. Two calcifications project within the right hemiabdomen possibly related to renal calculi, the larger of which measures 4 mm no. IMPRESSION: 1. Some interval decompression of dilated small bowel since prior exam. 2. Gastric tube appears to have been pulled back several cm since comparison study with tip and side port of the tube now in the left upper quadrant likely within proximal stomach. 3. Two punctate calcifications in the right hemiabdomen may reflect renal stones, the larger of which measures 4 mm. Electronically Signed   By: Ashley Royalty M.D.   On: 06/12/2016 03:20   Dg Abd Portable 1v  Result Date: 06/11/2016 CLINICAL DATA:  NG tube placement EXAM: PORTABLE ABDOMEN - 1 VIEW COMPARISON:  06/10/2016 FINDINGS: Esophageal tube tip overlies the distal stomach as does the  side port. Dilated loops of small bowel measuring  up to 4.5 cm, overall decreased caliber of bowel distention. Scattered distal bowel gas. No pathologic calcifications. Contrast in the bladder. Posterior stabilization rods and screws at the lumbosacral junction. IMPRESSION: 1. Esophageal tube tip overlies the distal stomach 2. Dilated loops of small bowel consistent with bowel obstruction, however decreased caliber of bowel distention compared to prior. Electronically Signed   By: Donavan Foil M.D.   On: 06/11/2016 04:10   Dg Abd Portable 1 View  Result Date: 06/10/2016 CLINICAL DATA:  Status post NG tube placement. EXAM: PORTABLE ABDOMEN - 1 VIEW COMPARISON:  CT abdomen and pelvis 06/10/2016 FINDINGS: An enteric tube has been placed with tip projecting in the region of the gastric cardia and side hole in the distal esophagus. Multiple loops of dilated small bowel are again seen, similar to the recent CT. Excreted IV contrast is present in the renal collecting systems and bladder. Evaluation for intraperitoneal free air is limited on this supine study. Brachytherapy seeds are noted in the prostate. Prior lower lumbar/ lumbosacral spinal fusion. Atelectasis in the lung bases. IMPRESSION: 1. Enteric tube the tip overlies the proximal stomach. Recommend advancing 10 cm which will also place the side hole in the stomach. 2. Similar appearance of small bowel dilatation consistent with obstruction as described on CT. Electronically Signed   By: Logan Bores M.D.   On: 06/10/2016 16:42     Discharge Exam: Vitals:   06/14/16 2132 06/15/16 0546  BP: (!) 98/51 118/80  Pulse: 77 79  Resp: (!) 22 20  Temp:  99.2 F (37.3 C)   Vitals:   06/14/16 0854 06/14/16 1407 06/14/16 2132 06/15/16 0546  BP:  123/74 (!) 98/51 118/80  Pulse:  80 77 79  Resp:  20 (!) 22 20  Temp:  98.9 F (37.2 C)  99.2 F (37.3 C)  TempSrc:  Oral  Oral  SpO2: 94% 94% 94% 97%  Weight:      Height:       General: Pt is alert, follows commands appropriately, not in acute  distress Cardiovascular: Regular rate and rhythm, S1/S2 +, no murmurs, no rubs, no gallops Respiratory: Clear to auscultation bilaterally, no wheezing, no crackles, no rhonchi Abdominal: Soft, non tender, non distended, bowel sounds +, no guarding  Discharge Instructions  Allergies as of 06/15/2016   No Known Allergies     Medication List    STOP taking these medications   azithromycin 500 MG tablet Commonly known as:  ZITHROMAX   oseltamivir 75 MG capsule Commonly known as:  TAMIFLU     TAKE these medications   aspirin EC 81 MG tablet Take 81 mg by mouth.   atorvastatin 20 MG tablet Commonly known as:  LIPITOR Take 20 mg by mouth daily.   hydrochlorothiazide 12.5 MG capsule Commonly known as:  MICROZIDE Take 12.5 mg by mouth daily.   oxyCODONE 5 MG immediate release tablet Commonly known as:  Oxy IR/ROXICODONE Take 1 tablet (5 mg total) by mouth every 4 (four) hours as needed for severe pain.   potassium chloride 10 MEQ tablet Commonly known as:  K-DUR Take 20 mEq by mouth daily.   PRESERVISION AREDS 2 PO Take 1 tablet by mouth daily.   tamsulosin 0.4 MG Caps capsule Commonly known as:  FLOMAX Take 0.4 mg by mouth daily.   Vitamin D3 1000 units Caps Take 2,000 Units by mouth daily.      Follow-up Information    PERINI,MARK  A, MD Follow up.   Specialty:  Internal Medicine Contact information: [address] [place] [address] [phone]           The results of significant diagnostics from this hospitalization (including imaging, microbiology, ancillary and laboratory) are listed below for reference.    Microbiology: No results found for this or any previous visit (from the past 240 hour(s)).   Labs: Basic Metabolic Panel:  Recent Labs Lab 06/11/16 0510 06/12/16 0609 06/13/16 0802 06/14/16 0629 06/15/16 0554  NA 151* 145 141 140 141  K 4.8 3.4* 3.8 3.4* 3.9  CL 104 106 102 100* 107  CO2 38* 32 32 31 27  GLUCOSE 146* 134* 117*  117* 126*  BUN 34* 32* 18 14 21*  CREATININE 1.30* 1.17 1.05 1.03 1.01  CALCIUM 9.2 8.2* 8.3* 8.5* 8.8*   Liver Function Tests:  Recent Labs Lab 06/09/16 2058 06/10/16 1334 06/11/16 0510  AST 34 47* 62*  ALT 30 36 30  ALKPHOS 63 56 53  BILITOT 1.3* 1.2 0.9  PROT 6.7 6.5 6.0*  ALBUMIN 4.0 3.8 3.4*    Recent Labs Lab 06/09/16 2058  LIPASE 19   CBC:  Recent Labs Lab 06/10/16 1334 06/11/16 0510 06/12/16 0609 06/13/16 0802 06/14/16 0629 06/15/16 0554  WBC 5.7 5.1 5.4 5.5 5.8 8.0  NEUTROABS 4.3  --   --   --   --   --   HGB 17.7* 16.6 14.6 14.6 14.4 15.6  HCT 53.7* 52.4* 46.7 45.0 43.9 47.0  MCV 97.1 99.8 100.2* 96.8 94.6 94.9  PLT 170 144* 139* 126* 124* 136*   SIGNED: Time coordinating discharge: 30 minutes  MAGICK-Dayvion Sans, MD  [facility] 06/15/2016, 9:30 AM Pager [phone]  If 7PM-7AM, please contact night-coverage [email or web address] Password [email or web address]

## 2016-06-23 DIAGNOSIS — I10 Essential (primary) hypertension: Secondary | ICD-10-CM | POA: Diagnosis not present

## 2016-06-27 DIAGNOSIS — L570 Actinic keratosis: Secondary | ICD-10-CM | POA: Diagnosis not present

## 2016-06-27 DIAGNOSIS — L821 Other seborrheic keratosis: Secondary | ICD-10-CM | POA: Diagnosis not present

## 2016-06-27 DIAGNOSIS — Z85828 Personal history of other malignant neoplasm of skin: Secondary | ICD-10-CM | POA: Diagnosis not present

## 2016-06-27 DIAGNOSIS — L723 Sebaceous cyst: Secondary | ICD-10-CM | POA: Diagnosis not present

## 2016-07-14 DIAGNOSIS — R531 Weakness: Secondary | ICD-10-CM | POA: Diagnosis not present

## 2016-07-14 DIAGNOSIS — G4700 Insomnia, unspecified: Secondary | ICD-10-CM | POA: Diagnosis not present

## 2016-07-14 DIAGNOSIS — C61 Malignant neoplasm of prostate: Secondary | ICD-10-CM | POA: Diagnosis not present

## 2016-07-14 DIAGNOSIS — I10 Essential (primary) hypertension: Secondary | ICD-10-CM | POA: Diagnosis not present

## 2016-08-07 ENCOUNTER — Encounter (INDEPENDENT_AMBULATORY_CARE_PROVIDER_SITE_OTHER): Payer: Self-pay

## 2016-08-07 ENCOUNTER — Ambulatory Visit (INDEPENDENT_AMBULATORY_CARE_PROVIDER_SITE_OTHER): Payer: Medicare Other | Admitting: Adult Health

## 2016-08-07 ENCOUNTER — Encounter: Payer: Self-pay | Admitting: Adult Health

## 2016-08-07 VITALS — BP 153/88 | HR 83 | Wt 189.4 lb

## 2016-08-07 DIAGNOSIS — G4733 Obstructive sleep apnea (adult) (pediatric): Secondary | ICD-10-CM | POA: Diagnosis not present

## 2016-08-07 DIAGNOSIS — R7301 Impaired fasting glucose: Secondary | ICD-10-CM | POA: Diagnosis not present

## 2016-08-07 DIAGNOSIS — I10 Essential (primary) hypertension: Secondary | ICD-10-CM | POA: Diagnosis not present

## 2016-08-07 DIAGNOSIS — Z9989 Dependence on other enabling machines and devices: Secondary | ICD-10-CM

## 2016-08-07 DIAGNOSIS — Z125 Encounter for screening for malignant neoplasm of prostate: Secondary | ICD-10-CM | POA: Diagnosis not present

## 2016-08-07 DIAGNOSIS — E784 Other hyperlipidemia: Secondary | ICD-10-CM | POA: Diagnosis not present

## 2016-08-07 NOTE — Patient Instructions (Signed)
Try using CPAP nightly Melatonin 3-6 mg 1-2 hours before bedtime If your symptoms worsen or you develop new symptoms please let us know.

## 2016-08-07 NOTE — Progress Notes (Addendum)
PATIENT: Jeremiah [name] DOB: 12/15/1940  REASON FOR VISIT: follow up- OSA on cpap HISTORY FROM: patient  HISTORY OF PRESENT ILLNESS: Jeremiah [name] is a 76 year old male with a history of obstructive sleep apnea on CPAP. He returns today for follow-up. He is currently using his CPAP every night for compliance of 100%. However he doesn't use his machine 4 hours every night. On average he uses his machine 2 hours and 27 minutes. His residual AHI is 4.9 on 9 cm of water with EPR 3. He does not have a significant leak. He is wearing the nasal mask. He states that he typically wakes up during the night and is unable to go back to sleep unless he takes his CPAP off. He states that he is using melatonin before bedtime. He does not feel that the mask nor machine is the cause for him waking up. He returns today for an evaluation.  HISTORY  05/09/16(MM): Jeremiah [name] is a 76 year old male with a history of obstructive sleep apnea on CPAP. He returns today for a compliance download. His download indicates that he tried to use his machine 25 out of 30 days but unfortunately he only used the machine for 20-30 minutes each night. He states that when he changed out his supplies for some reason he could not use the machine. He feels that this is psychological. He states that when he puts the mask on he feels that he cannot seem to sync his breathing with the machine and ultimately takes off the mask. He states that the supplies he received are the same that he was currently using. He is using the full face mask. Previous downloads have shown excellent compliance. He returns today for an evaluation.  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 The patient is unaccompanied today. I last saw him on 11/08/2015, at which time he reported fairly well. He felt he was adapting to CPAP therapy reasonably well and felt somewhat better rested and less tired during the day, nocturia had improved as well. He preferred the nasal pillows which he started using recently. Restless leg symptoms were infrequent and not very bothersome. In fact, he felt that his leg movements had improved since CPAP therapy. We talked about his split-night sleep study results from 08/29/2015 and his compliance data. He was not fully compliant with CPAP therapy. Residual AHI was 10 per hour. Leak was borderline.   Today, 02/08/2016: I reviewed his CPAP compliance data from 01/08/2016 through 02/06/2016 which is a total of 30 days during which time he used his machine every night with percent used days greater than 4 hours at 50%, indicating suboptimal compliance, average usage of 3 hours at 69 minutes, residual AHI borderline at 5.1 per hour, leak acceptable with the 95th percentile at 13.8 L/m at a pressure of 9 cm with EPR of 3.   Today, 02/08/2016: He reports that using CPAP is still an adjustment, ongoing challenge. He likes to work in the yard, does not always hydrate well enough, quit his PT job as a [name], Jeremiah d/t fatigue.   Previously:   I first met him on 07/28/2015 at the request  of his primary care physician, at which time he reported snoring and excessive daytime somnolence as well as difficulty initiating and maintaining sleep. His wife had noticed witnessed apneic pauses. He had a split-night sleep study on 08/29/2015. Sleep efficiency at baseline was 57.7% with a latency to sleep of 10 minutes and wake after sleep onset of 109.5 minutes with moderate sleep fragmentation noted. He had an increased percentage of light  stage sleep, absence of slow-wave sleep and REM sleep was 5.5% with a prolonged REM latency. He had moderate PLMS with mild arousals. He had no significant EKG changes. Mild to moderate snoring was noted. Total AHI was 57.1 per hour. Average oxygen saturation was only 87%, nadir was 73%. He was then titrated on CPAP. Sleep efficiency was 26.4% with a latency to sleep of 126.5 minutes and wake after sleep onset of 49 minutes with moderate sleep fragmentation noted. He had slow-wave sleep at 15.9%, and REM sleep at 20.6%. Average oxygen saturation was 91%, nadir was 86%. He had severe PLMS with minimal arousals. Based on his test results are prescribed CPAP therapy for home use.  I reviewed his CPAP compliance data from 10/05/2015 through 11/03/2015 which is a total of 30 days during which time he used his machine 29 days with percent used days greater than 4 hours at 40%, indicating suboptimal compliance with an average usage of 3 hours and 33 minutes for all nights. Residual AHI suboptimal at 10 per hour, leak at times borderline for the 95th percentile at 23.8 L/m on a pressure of 9 cm with EPR of 3.  07/28/2015: He reports snoring, excessive daytime somnolence, difficulty with his sleep, including problems initiating and maintaining sleep. He has tried over-the-counter melatonin, infrequently, does not recall the dose. He has had witnessed apneas per wife he states. 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 He worked for [employer] for over 40 years prior to that. His work hours are 11-1/2 hours each day he works. He has been able to manage this quite well he feels bedtime on average is 9 to 9:30 PM and rise time is between 5 and 6 AM. He does feel fairly adequately rested when he first wakes up. Sleep is disrupted primarily because of nocturia which is about 3 times each night on average. He denies restless leg symptoms currently but may have had some symptoms years ago. He had a  tonsillectomy. He lives with his wife. He has grown children. He has no pets. He does not watch TV in bed. He drinks alcohol very rarely. He quit smoking in the 60s. He drinks no caffeine on a day-to-day basis. He denies morning headaches. He denies memory issues. Recently he pulled his left hamstring muscles as he was trying to walk up stairs and missed a step. Also, about a month ago he took a fall as he was getting dressed. He toppled forward. He landed on his right side. He says he has a cracked rib. His Epworth sleepiness score is 11 out of 24 today, his fatigue score is 28 out of 63.  I reviewed your office note from 07/20/2015, which you kindly included.  REVIEW OF SYSTEMS: Out of a complete 14 system review of symptoms, the patient complains only of the following symptoms, and all other reviewed systems are negative.  Apnea, frequent waking, daytime sleepiness  ALLERGIES: No Known Allergies  HOME MEDICATIONS: Outpatient Medications Prior to Visit  Medication Sig Dispense Refill  . aspirin EC 81  MG tablet Take 81 mg by mouth.    . Cholecalciferol (VITAMIN D3) 1000 units CAPS Take 2,000 Units by mouth daily.     . hydrochlorothiazide (MICROZIDE) 12.5 MG capsule Take 12.5 mg by mouth daily.   0  . Multiple Vitamins-Minerals (PRESERVISION AREDS 2 PO) Take 1 tablet by mouth daily.     . potassium chloride (K-DUR) 10 MEQ tablet Take 20 mEq by mouth daily.   2  . tamsulosin (FLOMAX) 0.4 MG CAPS capsule Take 0.4 mg by mouth daily.     [name] Kitchen atorvastatin (LIPITOR) 20 MG tablet Take 20 mg by mouth daily.   2  . oxyCODONE (OXY IR/ROXICODONE) 5 MG immediate release tablet Take 1 tablet (5 mg total) by mouth every 4 (four) hours as needed for severe pain. (Patient not taking: Reported on 08/07/2016) 30 tablet 0   No facility-administered medications prior to visit.     PAST MEDICAL HISTORY: Past Medical History:  Diagnosis Date  . BPH (benign prostatic hyperplasia)   . Central retinal artery  occlusion   . Diabetes mellitus without complication (HCC)    diet controlled  . Diverticulosis   . Hyperlipidemia   . Hypertension   . Osteoarthritis   . 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 S/P radiation therapy 04/23/13 - 05/29/14   Prostate/seminal vesicles, external beam 4500 cGy in 25 sessions  . Skin cancer    scalp    PAST SURGICAL HISTORY: Past Surgical History:  Procedure Laterality Date  . BACK SURGERY  2009  . [place]  . PROSTATE BIOPSY  2013, 2014, 01/2014   Gleason 7  . RADIOACTIVE SEED IMPLANT N/A 07/01/2014   Procedure: RADIOACTIVE SEED IMPLANT;  Surgeon: [name], MD;  Location: [facility];  Service: Urology;  Laterality: N/A;  DR PORTABLE  . TONSILLECTOMY      FAMILY HISTORY: Family History  Problem Relation Age of Onset  . Heart attack Father   . Cancer Father     prostate  . Diabetes Father   . Cancer Paternal Uncle     prostate  . Lung cancer Brother     SOCIAL HISTORY: Social History   Social History  . Marital status: Married    Spouse name: N/A  . Number of children: N/A  . Years of education: N/A   Occupational History  . Not on file.   Social History Main Topics  . Smoking status: Former Smoker    Quit date: 03/10/1961  . Smokeless tobacco: Never Used  . Alcohol use 0.6 oz/week    1 Glasses of wine per week  . Drug use: No  . Sexual activity: Not on file   Other Topics Concern  . Not on file   Social History Narrative   Rare caffeine use       PHYSICAL EXAM  Vitals:   08/07/16 0718  BP: (!) 153/88  Pulse: 83  Weight: 189 lb 6.4 oz (85.9 kg)   Body mass index is 27.18 kg/m.  Generalized: Well developed, in no acute distress   Neurological examination  Mentation: Alert oriented to time, place, history taking. Follows all commands speech and language fluent Cranial nerve II-XII: Pupils were equal round reactive to light. Extraocular movements were full,  visual field were full on confrontational test. Facial sensation and strength were normal. Uvula tongue midline. Head turning and shoulder shrug  were normal and symmetric. Motor: The motor testing reveals 5  over 5 strength of all 4 extremities. Good symmetric motor tone is noted throughout.  Sensory: Sensory testing is intact to soft touch on all 4 extremities. No evidence of extinction is noted.  Coordination: Cerebellar testing reveals good finger-nose-finger and heel-to-shin bilaterally.  Gait and station: Gait is normal. Tandem gait is normal. Romberg is negative. No drift is seen.  Reflexes: Deep tendon reflexes are symmetric and normal bilaterally.   DIAGNOSTIC DATA (LABS, IMAGING, TESTING) - I reviewed patient records, labs, notes, testing and imaging myself where available.  Lab Results  Component Value Date   WBC 8.0 06/15/2016   HGB 15.6 06/15/2016   HCT 47.0 06/15/2016   MCV 94.9 06/15/2016   PLT 136 (L) 06/15/2016      Component Value Date/Time   NA 141 06/15/2016 0554   K 3.9 06/15/2016 0554   CL 107 06/15/2016 0554   CO2 27 06/15/2016 0554   GLUCOSE 126 (H) 06/15/2016 0554   BUN 21 (H) 06/15/2016 0554   CREATININE 1.01 06/15/2016 0554   CALCIUM 8.8 (L) 06/15/2016 0554   PROT 6.0 (L) 06/11/2016 0510   ALBUMIN 3.4 (L) 06/11/2016 0510   AST 62 (H) 06/11/2016 0510   ALT 30 06/11/2016 0510   ALKPHOS 53 06/11/2016 0510   BILITOT 0.9 06/11/2016 0510   GFRNONAA >60 06/15/2016 0554   GFRAA >60 06/15/2016 0554       ASSESSMENT AND PLAN 76 y.o. year old male  has a past medical history of BPH (benign prostatic hyperplasia); Central retinal artery occlusion; Diabetes mellitus without complication ([place]); Diverticulosis; Hyperlipidemia; Hypertension; Osteoarthritis; Prostate cancer ([place]) (01/2014); S/P radiation therapy (04/23/13 - 05/29/14); and Skin cancer. here with:  1. Obstructive sleep apnea on CPAP  The patient's download indicates that he is not using his machine at  least 4 hours every night. I have advised the patient that when he wakes up during the night he can take the mask off to give himself a break however he should put the mask back on before going back to sleep. The patient can also take melatonin 3-6 mg 1-2 hours before bedtime. Patient advised that if his symptoms do not improve he should let us know. He will follow-up in 6 months with Dr. Rexene Alberts.  I sent 15 minutes with the patient 50% of this time was spent reviewing the patient's CPAP download.  Ward Givens, MSN, NP-C 08/07/2016, 8:16 AM [facility] [address], [place], [place] [address] [phone]  I reviewed the above note and documentation by the Nurse Practitioner and agree with the history, physical exam, assessment and plan as outlined above. I was immediately available for face-to-face consultation. Star Age, MD, PhD [facility])

## 2016-08-14 DIAGNOSIS — G4709 Other insomnia: Secondary | ICD-10-CM | POA: Diagnosis not present

## 2016-08-14 DIAGNOSIS — Z0000 Encounter for general adult medical examination without abnormal findings: Secondary | ICD-10-CM | POA: Diagnosis not present

## 2016-08-14 DIAGNOSIS — R531 Weakness: Secondary | ICD-10-CM | POA: Diagnosis not present

## 2016-08-14 DIAGNOSIS — C61 Malignant neoplasm of prostate: Secondary | ICD-10-CM | POA: Diagnosis not present

## 2016-08-15 ENCOUNTER — Telehealth: Payer: Self-pay | Admitting: Adult Health

## 2016-08-15 DIAGNOSIS — G4733 Obstructive sleep apnea (adult) (pediatric): Secondary | ICD-10-CM

## 2016-08-15 DIAGNOSIS — Z9989 Dependence on other enabling machines and devices: Secondary | ICD-10-CM

## 2016-08-15 NOTE — Telephone Encounter (Signed)
Patient is calling to discuss his CPAP which leaked last night.

## 2016-08-15 NOTE — Telephone Encounter (Signed)
Please call to get more information. If his mask is leaking every night he may need to come in for a mask refitting

## 2016-08-15 NOTE — Addendum Note (Signed)
Addended by: Lester [place] A on: 08/15/2016 02:58 PM   Modules accepted: Orders

## 2016-08-15 NOTE — Telephone Encounter (Signed)
I called pt. Pt reports that last night he had water on the floor underneath his cpap. He thinks the hose or humidifier might be leaking. I advised him that he needs to reach out to [facility] for this but that I will speak with Jinny Blossom, NP and get an order for them to replace what is needed on his cpap. Pt verbalized understanding.  I spoke to [place], NP, received an order to replace his cpap supplies and fix humidifier if leaking. Order sent to Day.

## 2016-08-16 DIAGNOSIS — Z1212 Encounter for screening for malignant neoplasm of rectum: Secondary | ICD-10-CM | POA: Diagnosis not present

## 2016-08-16 DIAGNOSIS — G4733 Obstructive sleep apnea (adult) (pediatric): Secondary | ICD-10-CM | POA: Diagnosis not present

## 2016-08-30 DIAGNOSIS — M79671 Pain in right foot: Secondary | ICD-10-CM | POA: Diagnosis not present

## 2016-08-30 DIAGNOSIS — M19072 Primary osteoarthritis, left ankle and foot: Secondary | ICD-10-CM | POA: Diagnosis not present

## 2016-08-30 DIAGNOSIS — M79672 Pain in left foot: Secondary | ICD-10-CM | POA: Diagnosis not present

## 2016-08-30 DIAGNOSIS — M19071 Primary osteoarthritis, right ankle and foot: Secondary | ICD-10-CM | POA: Diagnosis not present

## 2016-10-11 DIAGNOSIS — C61 Malignant neoplasm of prostate: Secondary | ICD-10-CM | POA: Diagnosis not present

## 2016-10-17 DIAGNOSIS — H52223 Regular astigmatism, bilateral: Secondary | ICD-10-CM | POA: Diagnosis not present

## 2016-10-18 DIAGNOSIS — N401 Enlarged prostate with lower urinary tract symptoms: Secondary | ICD-10-CM | POA: Diagnosis not present

## 2016-10-18 DIAGNOSIS — N3281 Overactive bladder: Secondary | ICD-10-CM | POA: Diagnosis not present

## 2016-10-18 DIAGNOSIS — C61 Malignant neoplasm of prostate: Secondary | ICD-10-CM | POA: Diagnosis not present

## 2016-11-24 DIAGNOSIS — G4733 Obstructive sleep apnea (adult) (pediatric): Secondary | ICD-10-CM | POA: Diagnosis not present

## 2017-01-02 DIAGNOSIS — R296 Repeated falls: Secondary | ICD-10-CM | POA: Diagnosis not present

## 2017-01-02 DIAGNOSIS — R2689 Other abnormalities of gait and mobility: Secondary | ICD-10-CM | POA: Diagnosis not present

## 2017-01-02 DIAGNOSIS — I10 Essential (primary) hypertension: Secondary | ICD-10-CM | POA: Diagnosis not present

## 2017-01-02 DIAGNOSIS — R531 Weakness: Secondary | ICD-10-CM | POA: Diagnosis not present

## 2017-01-03 ENCOUNTER — Encounter: Payer: Self-pay | Admitting: Neurology

## 2017-01-25 NOTE — Progress Notes (Signed)
Jeremiah [name] was seen today in the movement disorders clinic for neurologic consultation at the request of [name], MD.  This patient is accompanied in the office by his spouse who supplements the history. The consultation is for the evaluation of L hand tremor (pt denies tremor), gait instability and to r/o PD.  He currently sees Dr. Rexene Alberts and those notes are reviewed.  He primarily sees her for OSAS.    Specific Symptoms:  Tremor: No. (pt denies tremor but noted in medical records) Family hx of similar:  No. Voice: softer per pt/wife (worse over the last year) Sleep: trouble staying asleep (has nocturia but isn't only reason).  Wears cpap  Vivid Dreams:  No.  Acting out dreams:  No. Wet Pillows: No. (wears CPAP mask anyway) Postural symptoms:  Yes.   (mostly in yard when sitting will fall over)  Falls?  Yes.  , has had 2, the last a few weeks ago tripped over uneven place in driveway; [place] and on uneven curb and walking between cars and hit head Bradykinesia symptoms: shuffling gait, slow movements, difficulty getting out of a chair and difficulty regaining balance (but these symptoms seem to come and go without reason) Loss of smell:  No. Loss of taste:  No. Urinary Incontinence:  No. Difficulty Swallowing:  No. Handwriting, micrographia: not necessarily small but "it's terrible because I can't get any flow" Trouble with ADL's:  No. (but sits to put on pants)  Trouble buttoning clothing: Yes.   (mild) Depression:  No. Memory changes:  No. Hallucinations:  No.  visual distortions: No. N/V:  No. Lightheaded:  No.  Syncope: No. Diplopia:  No. Dyskinesia:  No.  Neuroimaging of the brain has previously been performed.  It is available for my review today.  He had an MRI/MRA brain in 05/2012.  Radiology felt that size of ventricles was likely ex vacuo, but I reviewed those myself and felt that perhaps they were out of proportion to degree of  atrophy.  PREVIOUS MEDICATIONS: none to date  ALLERGIES:  No Known Allergies  CURRENT MEDICATIONS:  Outpatient Encounter Prescriptions as of 01/29/2017  Medication Sig  . acetaminophen (TYLENOL) 325 MG tablet Take 650 mg by mouth every 6 (six) hours as needed.  [name] Kitchen aspirin EC 81 MG tablet Take 81 mg by mouth.  [name] Kitchen atorvastatin (LIPITOR) 20 MG tablet Take 20 mg by mouth daily.  . Biotin 5000 MCG TABS Take 1 tablet by mouth daily.  . Cholecalciferol (VITAMIN D3) 1000 units CAPS Take 2,000 Units by mouth daily.   . hydrochlorothiazide (MICROZIDE) 12.5 MG capsule Take 12.5 mg by mouth daily.   . Mirabegron (MYRBETRIQ PO) Take by mouth.  . Multiple Vitamins-Minerals (PRESERVISION AREDS 2 PO) Take 1 tablet by mouth daily.   . potassium chloride (K-DUR) 10 MEQ tablet Take 20 mEq by mouth daily.   . tamsulosin (FLOMAX) 0.4 MG CAPS capsule Take 0.4 mg by mouth daily.    No facility-administered encounter medications on file as of 01/29/2017.     PAST MEDICAL HISTORY:   Past Medical History:  Diagnosis Date  . BPH (benign prostatic hyperplasia)   . Central retinal artery occlusion   . Diabetes mellitus without complication (HCC)    diet controlled  . Diverticulosis   . Hyperlipidemia   . Hypertension   . Osteoarthritis   . 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 S/P radiation therapy 04/23/13 - 05/29/14  Prostate/seminal vesicles, external beam 4500 cGy in 25 sessions  . Skin cancer    scalp    PAST SURGICAL HISTORY:   Past Surgical History:  Procedure Laterality Date  . BACK SURGERY  2009  . [place]  . PROSTATE BIOPSY  2013, 2014, 01/2014   Gleason 7  . RADIOACTIVE SEED IMPLANT N/A 07/01/2014   Procedure: RADIOACTIVE SEED IMPLANT;  Surgeon: [name], MD;  Location: [facility];  Service: Urology;  Laterality: N/A;  DR PORTABLE  . TONSILLECTOMY      SOCIAL HISTORY:   Social History   Social History  . Marital status:  Married    Spouse name: N/A  . Number of children: N/A  . Years of education: N/A   Occupational History  . retired     Special educational needs teacher   Social History Main Topics  . Smoking status: Former Smoker    Quit date: 03/10/1961  . Smokeless tobacco: Never Used  . Alcohol use 0.6 oz/week    1 Glasses of wine per week     Comment: rare alcohol  . Drug use: No  . Sexual activity: Not on file   Other Topics Concern  . Not on file   Social History Narrative   Rare caffeine use     FAMILY HISTORY:   Family Status  Relation Status  . Father Deceased at age 40       MI  . Annamarie Major Deceased  . Mother Deceased  . Sister Alive  . Brother Alive  . Son Alive  . Daughter Alive    ROS:  A complete 10 system review of systems was obtained and was unremarkable apart from what is mentioned above.  PHYSICAL EXAMINATION:    VITALS:   Vitals:   01/29/17 0941  BP: 124/72  Pulse: 79  Weight: 166 lb 4.8 oz (75.4 kg)  Height: 5\' 8"  (1.727 m)    GEN:  The patient appears stated age and is in NAD. HEENT:  Normocephalic, atraumatic.  The mucous membranes are moist. The superficial temporal arteries are without ropiness or tenderness. CV:  RRR Lungs:  CTAB Neck/HEME:  There are no carotid bruits bilaterally.  Neurological examination:  Orientation: The patient is alert and oriented x3. Fund of knowledge is appropriate.  Recent and remote memory are intact.  Attention and concentration are normal.    Able to name objects and repeat phrases. Cranial nerves: There is good facial symmetry. There is facial hypomimia.  Pupils are equal round and reactive to light bilaterally. Fundoscopic exam reveals clear margins bilaterally. Extraocular muscles are intact. The visual fields are full to confrontational testing. The speech is fluent and clear. Soft palate rises symmetrically and there is no tongue deviation. Hearing is intact to conversational tone. Sensation: Sensation is intact to light  and pinprick throughout (facial, trunk, extremities).   Reports decreased PROXIMALLY compared to distally.  Pin is decreased in RIGHT arm and leg compared to the left.  Vibration is absent at the bilateral big toe but intact and decreased at the ankle. There is no extinction with double simultaneous stimulation. There is no sensory dermatomal level identified. Motor: Strength is 5/5 in the bilateral upper and lower extremities.   Shoulder shrug is equal and symmetric.  There is no pronator drift. Deep tendon reflexes: Deep tendon reflexes are 2/4 at the bilateral biceps, triceps, brachioradialis, patella and 1/4 at the bilateral achilles. Plantar responses are downgoing bilaterally.  Movement examination:  Tone: There is normal tone in the bilateral upper extremities.  The tone in the lower extremities is normal.  Abnormal movements: none even with distraction Coordination:  There is mild decremation with RAM's, with hand opening and closing, finger taps, heel taps and toe taps on the L only Gait and Station: The patient has mild difficulty arising out of a deep-seated chair without the use of the hands and nearly falls back into the chair.  Posture is stooped. The patient's stride length is mildly decreased but doesn't shuffle.  L ankle is everted when walking from old injury.  The patient has a positive pull test.      ASSESSMENT/PLAN:  1.  Gait change and falls  -Not sure that this is parkinsonism and doesn't meet criteria for PD.  However, he does have an abnormal MRI brain that I reviewed from 2014.  I do think that the ventricles were out of proportion to degree of atrophy in 2014.  I am going to repeat that and consider doing a high volume LP.  Talked with patient and wife about that and they were agreeable.  Could consider DaT in the future but want to do this first.  -I do think that peripheral neuropathy could be accounting for gait instability and need to get labs from PCP.  Safety discussed  in relation to this.  2.  F/u after above testing completed.  Much greater than 50% of this visit was spent in counseling and coordinating care.  Total face to face time:  45 min    Cc:  [name], MD

## 2017-01-29 ENCOUNTER — Ambulatory Visit (INDEPENDENT_AMBULATORY_CARE_PROVIDER_SITE_OTHER): Payer: Medicare Other | Admitting: Neurology

## 2017-01-29 ENCOUNTER — Encounter: Payer: Self-pay | Admitting: Neurology

## 2017-01-29 VITALS — BP 124/72 | HR 79 | Ht 68.0 in | Wt 166.3 lb

## 2017-01-29 DIAGNOSIS — W19XXXA Unspecified fall, initial encounter: Secondary | ICD-10-CM | POA: Diagnosis not present

## 2017-01-29 DIAGNOSIS — G4719 Other hypersomnia: Secondary | ICD-10-CM | POA: Diagnosis not present

## 2017-01-29 DIAGNOSIS — R2681 Unsteadiness on feet: Secondary | ICD-10-CM | POA: Diagnosis not present

## 2017-02-05 ENCOUNTER — Encounter: Payer: Self-pay | Admitting: Neurology

## 2017-02-06 ENCOUNTER — Ambulatory Visit
Admission: RE | Admit: 2017-02-06 | Discharge: 2017-02-06 | Disposition: A | Discharge status: Home / Self Care | Payer: Medicare Other | Source: Ambulatory Visit | Attending: Neurology | Admitting: Neurology

## 2017-02-06 DIAGNOSIS — R2681 Unsteadiness on feet: Secondary | ICD-10-CM

## 2017-02-06 DIAGNOSIS — S0990XA Unspecified injury of head, initial encounter: Secondary | ICD-10-CM | POA: Diagnosis not present

## 2017-02-06 DIAGNOSIS — R42 Dizziness and giddiness: Secondary | ICD-10-CM | POA: Diagnosis not present

## 2017-02-06 DIAGNOSIS — W19XXXA Unspecified fall, initial encounter: Secondary | ICD-10-CM

## 2017-02-07 ENCOUNTER — Telehealth: Payer: Self-pay | Admitting: Neurology

## 2017-02-07 ENCOUNTER — Encounter: Payer: Self-pay | Admitting: Neurology

## 2017-02-07 ENCOUNTER — Ambulatory Visit (INDEPENDENT_AMBULATORY_CARE_PROVIDER_SITE_OTHER): Payer: Medicare Other | Admitting: Neurology

## 2017-02-07 VITALS — BP 136/86 | Ht 69.0 in | Wt 176.0 lb

## 2017-02-07 DIAGNOSIS — G919 Hydrocephalus, unspecified: Secondary | ICD-10-CM

## 2017-02-07 DIAGNOSIS — G4733 Obstructive sleep apnea (adult) (pediatric): Secondary | ICD-10-CM | POA: Diagnosis not present

## 2017-02-07 DIAGNOSIS — Z9989 Dependence on other enabling machines and devices: Secondary | ICD-10-CM

## 2017-02-07 NOTE — Telephone Encounter (Signed)
Patient made aware of appt date/time.

## 2017-02-07 NOTE — Patient Instructions (Addendum)
Please continue using your CPAP regularly. While your insurance requires that you use CPAP at least 4 hours each night on 70% of the nights, I recommend, due to your severe sleep apnea diagnosis, that you not skip any nights and use it throughout the night, every night, if you can. Getting used to CPAP and staying with the treatment long term does take time and patience and discipline. Untreated obstructive sleep apnea when it is moderate to severe can have an adverse impact on cardiovascular health and raise her risk for heart disease, arrhythmias, hypertension, congestive heart failure, stroke and diabetes. Untreated obstructive sleep apnea causes sleep disruption, nonrestorative sleep, and sleep deprivation. This can have an impact on your day to day functioning and cause daytime sleepiness and impairment of cognitive function, memory loss, mood disturbance, and problems focussing. Using CPAP regularly can improve these symptoms.   Please keep trying to put the mask back on at night.   Please follow up in 6 months, you can see one of our nurse practitioners, Jinny Blossom, again. I will see you after that.

## 2017-02-07 NOTE — Telephone Encounter (Signed)
Spoke with patient and he is agreeable.   Does this need to be LP with therapy assessment?  Any labs?

## 2017-02-07 NOTE — Progress Notes (Signed)
Subjective:    Patient ID: Jeremiah [name] is a 76 y.o. male.  HPI     Interim history:   Jeremiah [name] is a 76 year old right-handed gentleman with an underlying medical history of prostate cancer, status post radioactive seed implant in February 2016 (s/p XRT prior to that), overweight state, back pain, diverticulosis, hyperlipidemia, history of fall with concussion in 2003, rosacea, osteoarthritis, retinal artery occlusion in December 2013, who presents for follow-up consultation of his obstructive sleep apnea, on CPAP therapy. The patient is unaccompanied today. I last saw him on 02/08/2016, at which time he reported still trying to adjust to CPAP. He was about 50% with his compliance greater than 4 hours. He saw Ward Givens in the interim in December 2017 and then again in March 2018, again not completely compliant with treatment.  Today, 02/07/2017: I reviewed his CPAP compliance data from 01/07/2017 through 02/05/2017 which is a total of 30 days, during which time he used his machine every night but percent used days greater than 4 hours was 20%, indicating poor compliance with an average usage of 3 hours and 14 minutes, residual AHI 3.8 per hour, leak on the high side for the 95th percentile at 24.9 L/m on a pressure of 9 cm with EPR of 3. In the past 90 days his compliance percentage for more than 4 hours was also 20% only. He reports having difficulty with sleep maintenance difficulty and has to get up around 2 or 3 AM to go to the bathroom. After that, he typically does not CPAP back on. He is not actually bothered by the mask or the pressure. He does not currently take any sleep aid such as over-the-counter melatonin. He did not find it very helpful. He reports that he has gotten into the habit of not sleeping with CPAP once he goes to the bathroom and comes back. He would be willing to try more.   The patient's allergies, current medications, family history, past medical history, past  social history, past surgical history and problem list were reviewed and updated as appropriate.   Previously (copied from previous notes for reference):   I saw him on 11/08/2015, at which time he reported fairly well. He felt he was adapting to CPAP therapy reasonably well and felt somewhat better rested and less tired during the day, nocturia had improved as well. He preferred the nasal pillows which he started using recently. Restless leg symptoms were infrequent and not very bothersome. In fact, he felt that his leg movements had improved since CPAP therapy. We talked about his split-night sleep study results from 08/29/2015 and his compliance data. He was not fully compliant with CPAP therapy. Residual AHI was 10 per hour. Leak was borderline.    I reviewed his CPAP compliance data from 01/08/2016 through 02/06/2016 which is a total of 30 days during which time he used his machine every night with percent used days greater than 4 hours at 50%, indicating suboptimal compliance, average usage of 3 hours at 69 minutes, residual AHI borderline at 5.1 per hour, leak acceptable with the 95th percentile at 13.8 L/m at a pressure of 9 cm with EPR of 3.      I first met him on 07/28/2015 at the request of his primary care physician, at which time he reported snoring and excessive daytime somnolence as well as difficulty initiating and maintaining sleep. His wife had noticed witnessed apneic pauses. He had a split-night sleep study on 08/29/2015. Sleep efficiency at  baseline was 57.7% with a latency to sleep of 10 minutes and wake after sleep onset of 109.5 minutes with moderate sleep fragmentation noted. He had an increased percentage of light stage sleep, absence of slow-wave sleep and REM sleep was 5.5% with a prolonged REM latency. He had moderate PLMS with mild arousals. He had no significant EKG changes. Mild to moderate snoring was noted. Total AHI was 57.1 per hour. Average oxygen saturation was only  87%, nadir was 73%. He was then titrated on CPAP. Sleep efficiency was 26.4% with a latency to sleep of 126.5 minutes and wake after sleep onset of 49 minutes with moderate sleep fragmentation noted. He had slow-wave sleep at 15.9%, and REM sleep at 20.6%. Average oxygen saturation was 91%, nadir was 86%. He had severe PLMS with minimal arousals. Based on his test results are prescribed CPAP therapy for home use.   I reviewed his CPAP compliance data from 10/05/2015 through 11/03/2015 which is a total of 30 days during which time he used his machine 29 days with percent used days greater than 4 hours at 40%, indicating suboptimal compliance with an average usage of 3 hours and 33 minutes for all nights. Residual AHI suboptimal at 10 per hour, leak at times borderline for the 95th percentile at 23.8 L/m on a pressure of 9 cm with EPR of 3.   07/28/2015: He reports snoring, excessive daytime somnolence, difficulty with his sleep, including problems initiating and maintaining sleep. He has tried over-the-counter melatonin, infrequently, does not recall the dose. He has had witnessed apneas per wife he states. 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 He worked for [employer] for over 40 years prior to that. His work hours are 11-1/2 hours each day he works. He has been able to manage this quite well he feels bedtime on average is 9 to 9:30 PM and rise time is between 5 and 6 AM. He does feel fairly adequately rested when he first wakes up. Sleep is disrupted primarily because of nocturia which is about 3 times each night on average. He denies restless leg symptoms currently but may have had some symptoms years ago. He had a tonsillectomy. He lives with his wife. He has grown children. He has no pets. He does not watch TV in bed. He drinks alcohol very rarely. He quit smoking in the 60s. He drinks no caffeine on a day-to-day basis. He denies morning headaches. He denies memory issues. Recently  he pulled his left hamstring muscles as he was trying to walk up stairs and missed a step. Also, about a month ago he took a fall as he was getting dressed. He toppled forward. He landed on his right side. He says he has a cracked rib. His Epworth sleepiness score is 11 out of 24 today, his fatigue score is 28 out of 63.  I reviewed your office note from 07/20/2015, which you kindly included.  His Past Medical History Is Significant For: Past Medical History:  Diagnosis Date  . BPH (benign prostatic hyperplasia)   . Central retinal artery occlusion   . Diabetes mellitus without complication (HCC)    diet controlled  . Diverticulosis   . Hyperlipidemia   . Hypertension   . Osteoarthritis   . 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 S/P radiation therapy 04/23/13 - 05/29/14   Prostate/seminal vesicles, external beam 4500 cGy in 25 sessions  . Skin cancer    scalp  His Past Surgical History Is Significant For: Past Surgical History:  Procedure Laterality Date  . BACK SURGERY  2009  . [place]  . PROSTATE BIOPSY  2013, 2014, 01/2014   Gleason 7  . RADIOACTIVE SEED IMPLANT N/A 07/01/2014   Procedure: RADIOACTIVE SEED IMPLANT;  Surgeon: [name], MD;  Location: [facility];  Service: Urology;  Laterality: N/A;  DR PORTABLE  . TONSILLECTOMY      His Family History Is Significant For: Family History  Problem Relation Age of Onset  . Heart attack Father   . Cancer Father        prostate  . Diabetes Father   . Cancer Paternal Uncle        prostate  . Dementia Mother 65  . Lung cancer Brother     His Social History Is Significant For: Social History   Social History  . Marital status: Married    Spouse name: N/A  . Number of children: N/A  . Years of education: N/A   Occupational History  . retired     Special educational needs teacher   Social History Main Topics  . Smoking status: Former Smoker    Quit date: 03/10/1961  .  Smokeless tobacco: Never Used  . Alcohol use 0.6 oz/week    1 Glasses of wine per week     Comment: rare alcohol  . Drug use: No  . Sexual activity: Not on file   Other Topics Concern  . Not on file   Social History Narrative   Rare caffeine use     His Allergies Are:  No Known Allergies:   His Current Medications Are:  Outpatient Encounter Prescriptions as of 02/07/2017  Medication Sig  . acetaminophen (TYLENOL) 325 MG tablet Take 650 mg by mouth every 6 (six) hours as needed.  [name] Kitchen aspirin EC 81 MG tablet Take 81 mg by mouth.  [name] Kitchen atorvastatin (LIPITOR) 20 MG tablet Take 20 mg by mouth daily.  . Biotin 5000 MCG TABS Take 1 tablet by mouth daily.  . Cholecalciferol (VITAMIN D3) 1000 units CAPS Take 2,000 Units by mouth daily.   . hydrochlorothiazide (MICROZIDE) 12.5 MG capsule Take 12.5 mg by mouth daily.   . Mirabegron (MYRBETRIQ PO) Take by mouth.  . Multiple Vitamins-Minerals (PRESERVISION AREDS 2 PO) Take 1 tablet by mouth daily.   . potassium chloride (K-DUR) 10 MEQ tablet Take 20 mEq by mouth daily.   . tamsulosin (FLOMAX) 0.4 MG CAPS capsule Take 0.4 mg by mouth daily.    No facility-administered encounter medications on file as of 02/07/2017.   :  Review of Systems:  Out of a complete 14 point review of systems, all are reviewed and negative with the exception of these symptoms as listed below:  Review of Systems  Neurological:       Pt presents today to discuss his cpap. Pt reports difficulty keeping his cpap on at night. Pt is using [facility] as his  DME.    Objective:  Neurological Exam  Physical Exam Physical Examination:   Vitals:   02/07/17 0833  BP: 136/86    General Examination: The patient is a very pleasant 76 y.o. male in no acute distress. He appears well-developed and well-nourished and well groomed.   HEENT: Normocephalic, atraumatic, pupils are equal, round and reactive to light and accommodation. Extraocular tracking is good without limitation  to gaze excursion or nystagmus noted. Normal smooth pursuit is noted. Hearing is grossly intact. Face  is symmetric with normal facial animation and normal facial sensation. Speech is clear with no dysarthria noted, mild hypophonia. There is no lip, neck/head, jaw or voice tremor. Oropharynx exam reveals: mild mouth dryness, adequate dental hygiene and moderate airway crowding. Tongue protrudes centrally and palate elevates symmetrically.   Chest: Clear to auscultation without wheezing, rhonchi or crackles noted.  Heart: S1+S2+0, regular and normal without murmurs, rubs or gallops noted.   Abdomen: Soft, non-tender and non-distended with normal bowel sounds appreciated on auscultation.  Extremities: There is no pitting edema in the distal lower extremities bilaterally. Pedal pulses are intact.  Skin: Quite dry appearing with chronic appearing/sun exposure type changes noted.   Musculoskeletal: exam reveals no obvious joint deformities, tenderness or joint swelling or erythema.  Broke his L ankle as a teenager, L foot points out when standing and walking.   Neurologically:  Mental status: The patient is awake, alert and oriented in all 4 spheres. His immediate and remote memory, attention, language skills and fund of knowledge are appropriate. There is no evidence of aphasia, agnosia, apraxia or anomia. Speech is clear with normal prosody and enunciation. Thought process is linear. Mood is normal and affect is normal.  Cranial nerves II - XII are as described above under HEENT exam.  Motor exam: Normal bulk, strength and tone is noted. There is no drift, tremor or rebound. Romberg is negative. Reflexes are 1+ throughout. fine motor skills and coordination: grossly intact for age.   Cerebellar testing: No dysmetria or intention tremor. There is no truncal or gait ataxia.  Sensory exam: intact to light touch in the upper and lower extremities.  Gait, station and balance: He stands with mild  difficulty. No veering to one side is noted. No leaning to one side is noted. Posture is slightly tilted forward and to the right. L food turns outward while walking, no limp.   Assessment and Plan:  In summary, SYMON NORWOOD is a very pleasant 76 year old male with an underlying medical history of prostate cancer, status post radioactive seed implant in February 2016, overweight state, back pain, diverticulosis, hyperlipidemia, history of fall with concussion in 2003, rosacea, osteoarthritis, retinal artery occlusion in December 2013, who returns for follow-up consultation of his obstructive sleep apnea, on treatment with CPAP with Good compliance when it comes to using CPAP every night but significantly suboptimal or poor compliance when it comes to keeping the CPAP on. He is encouraged to try to keep working on it. He has gotten into the habit of not putting the CPAP on routinely after he comes back from his bathroom break at night. He would be willing to try more. He appears to be well treated on a pressure of 9 cm at this time and tolerates the nasal mask. I suggested a six-month recheck with one of our nurse practitioners. He is encouraged to hydrate a little better.  I answered all his questions today and he was in agreement.  I spent 20 minutes in total face-to-face time with the patient, more than 50% of which was spent in counseling and coordination of care, reviewing test results, reviewing medication and discussing or reviewing the diagnosis of OSA, its prognosis and treatment options. Pertinent laboratory and imaging test results that were available during this visit with the patient were reviewed by me and considered in my medical decision making (see chart for details).

## 2017-02-07 NOTE — Telephone Encounter (Signed)
Left message on machine for patient to call back.

## 2017-02-07 NOTE — Telephone Encounter (Signed)
-----   Message from Hurricane, DO sent at 02/07/2017  7:35 AM EDT ----- Reviewed.  There is a significant amount of motion artifact on the T1 images but there is significant atrophy.  Despite that, I will go ahead and set up high volume LP to make sure that not missing anything.  Tarryn Bogdan, please do this if patient agreeable (he and I already discussed it)

## 2017-02-07 NOTE — Telephone Encounter (Signed)
Order entered. Scheduled for 02/15/2017 at 10:00 am. To arrive at short stay at 8:30 am. [facility] for inpatient PT making them aware of lumbar puncture.   Left message on machine for patient to call back.

## 2017-02-07 NOTE — Telephone Encounter (Signed)
Yes.  Only the basic labs needed.  WBC, RBC, protein, Gluc in CSF.

## 2017-02-08 ENCOUNTER — Other Ambulatory Visit: Payer: Self-pay | Admitting: Physician Assistant

## 2017-02-15 ENCOUNTER — Encounter (HOSPITAL_COMMUNITY): Payer: Self-pay | Admitting: Physical Therapy

## 2017-02-15 ENCOUNTER — Ambulatory Visit (HOSPITAL_COMMUNITY)
Admission: RE | Admit: 2017-02-15 | Discharge: 2017-02-15 | Disposition: A | Discharge status: Home / Self Care | Payer: Medicare Other | Source: Ambulatory Visit | Attending: Neurology | Admitting: Neurology

## 2017-02-15 DIAGNOSIS — G912 (Idiopathic) normal pressure hydrocephalus: Secondary | ICD-10-CM | POA: Insufficient documentation

## 2017-02-15 DIAGNOSIS — G919 Hydrocephalus, unspecified: Secondary | ICD-10-CM | POA: Diagnosis present

## 2017-02-15 LAB — CSF CELL COUNT WITH DIFFERENTIAL
RBC Count, CSF: 0 /mm3
Tube #: 3
WBC, CSF: 0 /mm3 (ref 0–5)

## 2017-02-15 LAB — GLUCOSE, CSF: Glucose, CSF: 68 mg/dL (ref 40–70)

## 2017-02-15 LAB — PROTEIN, CSF: Total  Protein, CSF: 83 mg/dL — ABNORMAL HIGH (ref 15–45)

## 2017-02-15 MED ORDER — LIDOCAINE HCL (PF) 1 % IJ SOLN
5.0000 mL | Freq: Once | INTRAMUSCULAR | Status: AC
  Administered 2017-02-15: 2 mL via INTRADERMAL

## 2017-02-15 NOTE — Procedures (Signed)
Fluoro guided lumbar puncture performed at L3-L4.  Opening pressure 19.5 cm H20.  25 mL clear CSF obtained and sent to lab per orders of primary medical team.  No complications.  Please see full dictation in PACS for additional details.

## 2017-02-15 NOTE — Discharge Instructions (Signed)
Lumbar Puncture, Care After °Refer to this sheet in the next few weeks. These instructions provide you with information on caring for yourself after your procedure. Your health care provider may also give you more specific instructions. Your treatment has been planned according to current medical practices, but problems sometimes occur. Call your health care provider if you have any problems or questions after your procedure. °What can I expect after the procedure? °After your procedure, it is typical to have the following sensations: °· Mild discomfort or pain at the insertion site. °· Mild headache that is relieved with pain medicines. ° °Follow these instructions at home: ° °· Avoid lifting anything heavier than 10 lb (4.5 kg) for at least 12 hours after the procedure. °· Drink enough fluids to keep your urine clear or pale yellow. °Contact a health care provider if: °· You have fever or chills. °· You have nausea or vomiting. °· You have a headache that lasts for more than 2 days. °Get help right away if: °· You have any numbness or tingling in your legs. °· You are unable to control your bowel or bladder. °· You have bleeding or swelling in your back at the insertion site. °· You are dizzy or faint. °This information is not intended to replace advice given to you by your health care provider. Make sure you discuss any questions you have with your health care provider. °Document Released: 05/13/2013 Document Revised: 10/14/2015 Document Reviewed: 01/14/2013 °Elsevier Interactive Patient Education © 2017 [employer] ° °

## 2017-02-16 ENCOUNTER — Telehealth: Payer: Self-pay | Admitting: Neurology

## 2017-02-16 NOTE — Telephone Encounter (Signed)
Patient needs to talk to someone about how he is doing and the spinal tap he had done

## 2017-02-16 NOTE — Telephone Encounter (Signed)
Called patient and he states that after lumbar puncture his symptoms went away completely. He has not had any other balance issues and he feels much better. He wanted to pass this along. Results in EPIC (PT note under media).

## 2017-02-18 LAB — CSF CULTURE W GRAM STAIN
Culture: NO GROWTH
Gram Stain: NONE SEEN

## 2017-02-19 NOTE — Telephone Encounter (Signed)
Patient states he is still walking like "nothing is wrong". I made a follow up for January and he will call if anything changes.

## 2017-02-19 NOTE — Telephone Encounter (Signed)
Are they still gone.  PT reported no significant change?  Does he have a follow up here?

## 2017-02-19 NOTE — Telephone Encounter (Signed)
I will call patient to see how he is doing after this weekend.   He does not have a follow up appt, how soon would you like him seen?

## 2017-02-19 NOTE — Telephone Encounter (Signed)
If doing well as reported the next available ok

## 2017-03-07 DIAGNOSIS — Z23 Encounter for immunization: Secondary | ICD-10-CM | POA: Diagnosis not present

## 2017-03-26 DIAGNOSIS — R05 Cough: Secondary | ICD-10-CM | POA: Diagnosis not present

## 2017-03-26 DIAGNOSIS — J111 Influenza due to unidentified influenza virus with other respiratory manifestations: Secondary | ICD-10-CM | POA: Diagnosis not present

## 2017-03-26 DIAGNOSIS — Z6826 Body mass index (BMI) 26.0-26.9, adult: Secondary | ICD-10-CM | POA: Diagnosis not present

## 2017-03-26 DIAGNOSIS — R61 Generalized hyperhidrosis: Secondary | ICD-10-CM | POA: Diagnosis not present

## 2017-04-18 DIAGNOSIS — M5417 Radiculopathy, lumbosacral region: Secondary | ICD-10-CM | POA: Diagnosis not present

## 2017-04-18 DIAGNOSIS — C61 Malignant neoplasm of prostate: Secondary | ICD-10-CM | POA: Diagnosis not present

## 2017-04-18 DIAGNOSIS — G894 Chronic pain syndrome: Secondary | ICD-10-CM | POA: Diagnosis not present

## 2017-04-18 DIAGNOSIS — M5137 Other intervertebral disc degeneration, lumbosacral region: Secondary | ICD-10-CM | POA: Diagnosis not present

## 2017-05-21 DIAGNOSIS — N3281 Overactive bladder: Secondary | ICD-10-CM | POA: Diagnosis not present

## 2017-05-21 DIAGNOSIS — Z8546 Personal history of malignant neoplasm of prostate: Secondary | ICD-10-CM | POA: Diagnosis not present

## 2017-05-28 NOTE — Progress Notes (Signed)
Jeremiah [name] was seen today in the movement disorders clinic for neurologic consultation at the request of [name], MD.  This patient is accompanied in the office by his spouse who supplements the history. The consultation is for the evaluation of L hand tremor (pt denies tremor), gait instability and to r/o PD.  He currently sees Dr. Rexene Alberts and those notes are reviewed.  He primarily sees her for OSAS.    Specific Symptoms:  Tremor: No. (pt denies tremor but noted in medical records) Family hx of similar:  No. Voice: softer per pt/wife (worse over the last year) Sleep: trouble staying asleep (has nocturia but isn't only reason).  Wears cpap  Vivid Dreams:  No.  Acting out dreams:  No. Wet Pillows: No. (wears CPAP mask anyway) Postural symptoms:  Yes.   (mostly in yard when sitting will fall over)  Falls?  Yes.  , has had 2, the last a few weeks ago tripped over uneven place in driveway; other [place] church and on uneven curb and walking between cars and hit head Bradykinesia symptoms: shuffling gait, slow movements, difficulty getting out of a chair and difficulty regaining balance (but these symptoms seem to come and go without reason) Loss of smell:  No. Loss of taste:  No. Urinary Incontinence:  No. Difficulty Swallowing:  No. Handwriting, micrographia: not necessarily small but "it's terrible because I can't get any flow" Trouble with ADL's:  No. (but sits to put on pants)  Trouble buttoning clothing: Yes.   (mild) Depression:  No. Memory changes:  No. Hallucinations:  No.  visual distortions: No. N/V:  No. Lightheaded:  No.  Syncope: No. Diplopia:  No. Dyskinesia:  No.  Neuroimaging of the brain has previously been performed.  It is available for my review today.  He had an MRI/MRA brain in 05/2012.  Radiology felt that size of ventricles was likely ex vacuo, but I reviewed those myself and felt that perhaps they were out of proportion to degree of atrophy.  05/29/17  update: Patient was seen today in follow-up.  He had a high volume lumbar puncture at the end of September.  Physical therapy was with him and reported no significant change.  However, we will make out the patient, he reported that he did markedly better after that.  In fact, he stated that he was still better several days after that.  He states that he is back to baseline now.   He made a follow-up appointment and is here to discuss how he feels.  He had a few falls, generally one time a month.  He has better and worse days.    PREVIOUS MEDICATIONS: none to date  ALLERGIES:  No Known Allergies  CURRENT MEDICATIONS:  Outpatient Encounter Medications as of 05/29/2017  Medication Sig  . acetaminophen (TYLENOL) 325 MG tablet Take 650 mg by mouth every 6 (six) hours as needed for mild pain or moderate pain.   [name] Kitchen aspirin EC 81 MG tablet Take 81 mg by mouth.  [name] Kitchen atorvastatin (LIPITOR) 20 MG tablet Take 20 mg by mouth daily.  . Biotin 5000 MCG TABS Take 1 tablet by mouth daily.  . Cholecalciferol (VITAMIN D3) 1000 units CAPS Take 2,000 Units by mouth daily.   . hydrochlorothiazide (MICROZIDE) 12.5 MG capsule Take 12.5 mg by mouth daily.   . Multiple Vitamins-Minerals (PRESERVISION AREDS 2 PO) Take 1 tablet by mouth daily.   . potassium chloride (K-DUR) 10 MEQ tablet Take 20 mEq by  mouth daily.   . tamsulosin (FLOMAX) 0.4 MG CAPS capsule Take 0.4 mg by mouth daily.    No facility-administered encounter medications on file as of 05/29/2017.     PAST MEDICAL HISTORY:   Past Medical History:  Diagnosis Date  . BPH (benign prostatic hyperplasia)   . Central retinal artery occlusion   . Diabetes mellitus without complication (HCC)    diet controlled  . Diverticulosis   . Hyperlipidemia   . Hypertension   . Osteoarthritis   . 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 S/P radiation therapy 04/23/13 - 05/29/14   Prostate/seminal vesicles, external beam 4500 cGy in 25 sessions  . Skin  cancer    scalp    PAST SURGICAL HISTORY:   Past Surgical History:  Procedure Laterality Date  . BACK SURGERY  2009  . [place]  . PROSTATE BIOPSY  2013, 2014, 01/2014   Gleason 7  . RADIOACTIVE SEED IMPLANT N/A 07/01/2014   Procedure: RADIOACTIVE SEED IMPLANT;  Surgeon: [name], MD;  Location: [facility];  Service: Urology;  Laterality: N/A;  DR PORTABLE  . TONSILLECTOMY      SOCIAL HISTORY:   Social History   Socioeconomic History  . Marital status: Married    Spouse name: Not on file  . Number of children: Not on file  . Years of education: Not on file  . Highest education level: Not on file  Social Needs  . Financial resource strain: Not on file  . Food insecurity - worry: Not on file  . Food insecurity - inability: Not on file  . Transportation needs - medical: Not on file  . Transportation needs - non-medical: Not on file  Occupational History  . Occupation: retired    Comment: all Naval architect  Tobacco Use  . Smoking status: Former Smoker    Last attempt to quit: 03/10/1961    Years since quitting: 56.2  . Smokeless tobacco: Never Used  Substance and Sexual Activity  . Alcohol use: Yes    Alcohol/week: 0.6 oz    Types: 1 Glasses of wine per week    Comment: rare alcohol  . Drug use: No  . Sexual activity: Not on file  Other Topics Concern  . Not on file  Social History Narrative   Rare caffeine use     FAMILY HISTORY:   Family Status  Relation Name Status  . Father  Deceased at age 45       MI  . Annamarie Major 2 Deceased  . Mother  Deceased  . Sister  Alive  . Brother  Alive  . Son  Alive  . Daughter  Alive    ROS:  A complete 10 system review of systems was obtained and was unremarkable apart from what is mentioned above.  PHYSICAL EXAMINATION:    VITALS:   There were no vitals filed for this visit.  GEN:  The patient appears stated age and is in NAD. HEENT:  Normocephalic, atraumatic.  The mucous  membranes are moist. The superficial temporal arteries are without ropiness or tenderness. CV:  RRR Lungs:  CTAB Neck/HEME:  There are no carotid bruits bilaterally.  Neurological examination:  Orientation: The patient is alert and oriented x3.  Cranial nerves: There is good facial symmetry. There is facial hypomimia.  Pupils are equal round and reactive to light bilaterally. Fundoscopic exam reveals clear margins bilaterally. Extraocular muscles are intact. The visual  fields are full to confrontational testing. The speech is fluent and clear. Soft palate rises symmetrically and there is no tongue deviation. Hearing is intact to conversational tone. Sensation: Sensation is intact to light touch x 4 Motor: Strength is antigravity x 4.  Movement examination: Tone: There is normal tone in the bilateral upper extremities.  The tone in the lower extremities is normal.  Abnormal movements: none even with distraction Coordination:  There is no significant decremation with any form of RAMS, including alternating supination and pronation of the forearm, hand opening and closing, finger taps, heel taps and toe taps bilaterally Gait and Station: The patient pushes off of the chair when he arises (cannot arise without the use of the hand).  He is just mildly stooped/bent at the waist.  Slightly drags the heels.  Walks fairly steady     ASSESSMENT/PLAN:  1.  Gait change and falls  -Not sure that this is parkinsonism and doesn't meet criteria for PD.  I still think that his ventricles are fairly large, even though he does have a significant amount of atrophy.  He had a high volume lumbar puncture and he felt that he got markedly better, but physical therapy did not feel that his change was significant, at least at the day the tap was performed.  He also is not sure that he had "placebo" effect.  I told him that I think that it would be worth it to try the carbidopa/levodopa 25/100 and see if it helps.  He was  agreeable. He is going to ACT 3 days per week.  -talked about shunting but both of [place] agreed to hold on that for now.  2.  Follow up is anticipated in the next few months, sooner should new neurologic issues arise.  Much greater than 50% of this visit was spent in counseling and coordinating care.  Fall safety discussed in detail  Total face to face time:  25 min    Cc:  [name], MD

## 2017-05-29 ENCOUNTER — Encounter: Payer: Self-pay | Admitting: Neurology

## 2017-05-29 ENCOUNTER — Ambulatory Visit: Payer: Medicare Other | Admitting: Neurology

## 2017-05-29 VITALS — BP 122/80 | HR 80 | Ht 69.0 in | Wt 176.0 lb

## 2017-05-29 DIAGNOSIS — R9402 Abnormal brain scan: Secondary | ICD-10-CM

## 2017-05-29 DIAGNOSIS — R2681 Unsteadiness on feet: Secondary | ICD-10-CM

## 2017-05-29 DIAGNOSIS — W19XXXD Unspecified fall, subsequent encounter: Secondary | ICD-10-CM | POA: Diagnosis not present

## 2017-05-29 MED ORDER — CARBIDOPA-LEVODOPA 25-100 MG PO TABS: 1.0000 | ORAL_TABLET | Freq: Three times a day (TID) | ORAL | 1 refills | Status: DC

## 2017-05-29 NOTE — Patient Instructions (Signed)
Start carbidopa/levodopa 25/100 as follows:  1/2 tab three times a day before meals x 1 wk, then 1/2 in am & noon & 1 in evening for a week, then 1/2 in am &1 at noon &one in evening for a week, then 1 tablet three times a day 30 min before meals

## 2017-05-31 ENCOUNTER — Telehealth: Payer: Self-pay | Admitting: Neurology

## 2017-05-31 NOTE — Telephone Encounter (Signed)
Pt called and has a question regarding a medication that a friend of his is tasking for his parkinsons and wonders of Dr Tat might suggest it for him to take

## 2017-05-31 NOTE — Telephone Encounter (Signed)
Called patient. He states what he actually called about is  that his friend is taking Boxing class and doing well and wants to know if she would recommend for him.   I advised that Boxing has been know to help Parkinsonian symptoms and advised it would be a great idea.   Flyer mailed to patient about [employer] per his request.

## 2017-06-27 DIAGNOSIS — Z85828 Personal history of other malignant neoplasm of skin: Secondary | ICD-10-CM | POA: Diagnosis not present

## 2017-06-27 DIAGNOSIS — L570 Actinic keratosis: Secondary | ICD-10-CM | POA: Diagnosis not present

## 2017-06-27 DIAGNOSIS — L821 Other seborrheic keratosis: Secondary | ICD-10-CM | POA: Diagnosis not present

## 2017-06-27 DIAGNOSIS — D1801 Hemangioma of skin and subcutaneous tissue: Secondary | ICD-10-CM | POA: Diagnosis not present

## 2017-07-05 DIAGNOSIS — H6123 Impacted cerumen, bilateral: Secondary | ICD-10-CM | POA: Diagnosis not present

## 2017-07-05 DIAGNOSIS — R2689 Other abnormalities of gait and mobility: Secondary | ICD-10-CM | POA: Diagnosis not present

## 2017-07-09 DIAGNOSIS — G4733 Obstructive sleep apnea (adult) (pediatric): Secondary | ICD-10-CM | POA: Diagnosis not present

## 2017-07-12 DIAGNOSIS — H9193 Unspecified hearing loss, bilateral: Secondary | ICD-10-CM | POA: Diagnosis not present

## 2017-07-12 DIAGNOSIS — H6121 Impacted cerumen, right ear: Secondary | ICD-10-CM | POA: Diagnosis not present

## 2017-08-06 ENCOUNTER — Encounter: Payer: Self-pay | Admitting: Neurology

## 2017-08-07 ENCOUNTER — Encounter (INDEPENDENT_AMBULATORY_CARE_PROVIDER_SITE_OTHER): Payer: Self-pay

## 2017-08-07 ENCOUNTER — Ambulatory Visit: Payer: Medicare Other | Admitting: Adult Health

## 2017-08-07 ENCOUNTER — Encounter: Payer: Self-pay | Admitting: Adult Health

## 2017-08-07 VITALS — BP 115/68 | HR 80 | Wt 177.4 lb

## 2017-08-07 DIAGNOSIS — G4733 Obstructive sleep apnea (adult) (pediatric): Secondary | ICD-10-CM

## 2017-08-07 DIAGNOSIS — Z9989 Dependence on other enabling machines and devices: Secondary | ICD-10-CM | POA: Diagnosis not present

## 2017-08-07 NOTE — Patient Instructions (Signed)
Your Plan:  Continue using CPAP nightly If your symptoms worsen or you develop new symptoms please let us know.   Thank you for coming to see us at [facility]. I hope we have been able to provide you high quality care today.  You may receive a patient satisfaction survey over the next few weeks. We would appreciate your feedback and comments so that we may continue to improve ourselves and the health of our patients.  

## 2017-08-07 NOTE — Progress Notes (Addendum)
PATIENT: Jeremiah [name] DOB: [birth year]  REASON FOR VISIT: follow up HISTORY FROM: patient  HISTORY OF PRESENT ILLNESS: Today 08/07/17  Jeremiah [name] is a 77 year old male with a history of obstructive sleep apnea on CPAP.  He returns today for follow-up.  His CPAP download indicates that he use his machine 30 out of 30 days for compliance of 100%.  He uses machine greater than 4 hours 29 out of 30 days for compliance of 97%.  On average he uses his machine 6 hours and 39 minutes.  His residual AHI is 3.1 on 9 cm of water with EPR of 3.  He reports that he does not feel the machine leaking at night.  He reports that he continues to feel the machine is working well for him.  He returns today for an evaluation.  HISTORY 02/07/17: 02/07/2017: I reviewed his CPAP compliance data from 01/07/2017 through 02/05/2017 which is a total of 30 days, during which time he used his machine every night but percent used days greater than 4 hours was 20%, indicating poor compliance with an average usage of 3 hours and 14 minutes, residual AHI 3.8 per hour, leak on the high side for the 95th percentile at 24.9 L/m on a pressure of 9 cm with EPR of 3. In the past 90 days his compliance percentage for more than 4 hours was also 20% only. He reports having difficulty with sleep maintenance difficulty and has to get up around 2 or 3 AM to go to the bathroom. After that, he typically does not CPAP back on. He is not actually bothered by the mask or the pressure. He does not currently take any sleep aid such as over-the-counter melatonin. He did not find it very helpful. He reports that he has gotten into the habit of not sleeping with CPAP once he goes to the bathroom and comes back. He would be willing to try more.  The patient's allergies, current medications, family history, past medical history, past social history, past surgical history and problem list were reviewed and updated as appropriate.   REVIEW OF SYSTEMS:  Out of a complete 14 system review of symptoms, the patient complains only of the following symptoms, and all other reviewed systems are negative.  ESS 9  See HPI  ALLERGIES: No Known Allergies  HOME MEDICATIONS: Outpatient Medications Prior to Visit  Medication Sig Dispense Refill  . acetaminophen (TYLENOL) 325 MG tablet Take 650 mg by mouth every 6 (six) hours as needed for mild pain or moderate pain.     [name] Kitchen aspirin EC 81 MG tablet Take 81 mg by mouth.    [name] Kitchen atorvastatin (LIPITOR) 20 MG tablet Take 20 mg by mouth daily.    . Biotin 5000 MCG TABS Take 1 tablet by mouth daily.    . carbidopa-levodopa (SINEMET IR) 25-100 MG tablet Take 1 tablet by mouth 3 (three) times daily. 270 tablet 1  . Cholecalciferol (VITAMIN D3) 1000 units CAPS Take 2,000 Units by mouth daily.     . hydrochlorothiazide (MICROZIDE) 12.5 MG capsule Take 12.5 mg by mouth daily.   0  . Multiple Vitamins-Minerals (PRESERVISION AREDS 2 PO) Take 1 tablet by mouth daily.     . potassium chloride (K-DUR) 10 MEQ tablet Take 20 mEq by mouth daily.   2  . tamsulosin (FLOMAX) 0.4 MG CAPS capsule Take 0.4 mg by mouth daily.      No facility-administered medications prior to visit.     PAST  MEDICAL HISTORY: Past Medical History:  Diagnosis Date  . BPH (benign prostatic hyperplasia)   . Central retinal artery occlusion   . Diabetes mellitus without complication (HCC)    diet controlled  . Diverticulosis   . Hyperlipidemia   . Hypertension   . OSA on CPAP   . Osteoarthritis   . 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 S/P radiation therapy 04/23/13 - 05/29/14   Prostate/seminal vesicles, external beam 4500 cGy in 25 sessions  . Skin cancer    scalp    PAST SURGICAL HISTORY: Past Surgical History:  Procedure Laterality Date  . BACK SURGERY  2009  . [place]  . PROSTATE BIOPSY  2013, 2014, 01/2014   Gleason 7  . RADIOACTIVE SEED IMPLANT N/A 07/01/2014   Procedure: RADIOACTIVE SEED  IMPLANT;  Surgeon: [name], MD;  Location: [facility];  Service: Urology;  Laterality: N/A;  DR PORTABLE  . TONSILLECTOMY      FAMILY HISTORY: Family History  Problem Relation Age of Onset  . Heart attack Father   . Cancer Father        prostate  . Diabetes Father   . Cancer Paternal Uncle        prostate  . Dementia Mother 65  . Lung cancer Brother     SOCIAL HISTORY: Social History   Socioeconomic History  . Marital status: Married    Spouse name: Not on file  . Number of children: Not on file  . Years of education: Not on file  . Highest education level: Not on file  Social Needs  . Financial resource strain: Not on file  . Food insecurity - worry: Not on file  . Food insecurity - inability: Not on file  . Transportation needs - medical: Not on file  . Transportation needs - non-medical: Not on file  Occupational History  . Occupation: retired    Comment: all Naval architect  Tobacco Use  . Smoking status: Former Smoker    Last attempt to quit: 03/10/1961    Years since quitting: 56.4  . Smokeless tobacco: Never Used  Substance and Sexual Activity  . Alcohol use: Yes    Alcohol/week: 0.6 oz    Types: 1 Glasses of wine per week    Comment: rare alcohol  . Drug use: No  . Sexual activity: Not on file  Other Topics Concern  . Not on file  Social History Narrative   Rare caffeine use       PHYSICAL EXAM  Vitals:   08/07/17 1104  BP: 115/68  Pulse: 80  Weight: 177 lb 6.4 oz (80.5 kg)   Body mass index is 26.2 kg/m.  Generalized: Well developed, in no acute distress   Neurological examination  Mentation: Alert oriented to time, place, history taking. Follows all commands speech and language fluent Cranial nerve II-XII: Pupils were equal round reactive to light. Extraocular movements were full, visual field were full on confrontational test. Facial sensation and strength were normal. Uvula tongue midline. Head turning and  shoulder shrug  were normal and symmetric.  Motor: The motor testing reveals 5 over 5 strength of all 4 extremities. Good symmetric motor tone is noted throughout.  Sensory: Sensory testing is intact to soft touch on all 4 extremities. No evidence of extinction is noted.  Coordination: Cerebellar testing reveals good finger-nose-finger and heel-to-shin bilaterally.  Gait and station: Gait is normal. Tandem gait is normal.  Romberg is negative. No drift is seen.  Reflexes: Deep tendon reflexes are symmetric and normal bilaterally.   DIAGNOSTIC DATA (LABS, IMAGING, TESTING) - I reviewed patient records, labs, notes, testing and imaging myself where available.  Lab Results  Component Value Date   WBC 8.0 06/15/2016   HGB 15.6 06/15/2016   HCT 47.0 06/15/2016   MCV 94.9 06/15/2016   PLT 136 (L) 06/15/2016      Component Value Date/Time   NA 141 06/15/2016 0554   K 3.9 06/15/2016 0554   CL 107 06/15/2016 0554   CO2 27 06/15/2016 0554   GLUCOSE 126 (H) 06/15/2016 0554   BUN 21 (H) 06/15/2016 0554   CREATININE 1.01 06/15/2016 0554   CALCIUM 8.8 (L) 06/15/2016 0554   PROT 6.0 (L) 06/11/2016 0510   ALBUMIN 3.4 (L) 06/11/2016 0510   AST 62 (H) 06/11/2016 0510   ALT 30 06/11/2016 0510   ALKPHOS 53 06/11/2016 0510   BILITOT 0.9 06/11/2016 0510   GFRNONAA >60 06/15/2016 0554   GFRAA >60 06/15/2016 0554    Lab Results  Component Value Date   HGBA1C  08/25/2008    5.8 (NOTE) The ADA recommends the following therapeutic goal for glycemic control related to Hgb A1c measurement: Goal of therapy: <6.5 Hgb A1c  Reference: American Diabetes Association: Clinical Practice Recommendations 2010, Diabetes Care, 2010, 33: (Suppl  1).      ASSESSMENT AND PLAN 77 y.o. year old male  has a past medical history of BPH (benign prostatic hyperplasia), Central retinal artery occlusion, Diabetes mellitus without complication ([place]), Diverticulosis, Hyperlipidemia, Hypertension, OSA on CPAP,  Osteoarthritis, Prostate cancer ([place]) (01/2014), S/P radiation therapy (04/23/13 - 05/29/14), and Skin cancer. here with:  1.  Obstructive sleep apnea on CPAP  The patient CPAP download shows excellent compliance and good treatment of his apnea.  The patient does have a elevated leak.  I instructed the patient to make sure that his straps are tightened appropriately.  If he feels his mask leaking we may need to send him for mask refitting.  Patient voiced understanding.  He will follow-up in 1 year or sooner if needed.   I spent 15 minutes with the patient. 50% of this time was spent reviewing his CPAP download   Ward Givens, MSN, NP-C 08/07/2017, 11:08 AM [facility] [address] [place], [place] [address] [phone]  I reviewed the above note and documentation by the Nurse Practitioner and agree with the history, physical exam, assessment and plan as outlined above. I was immediately available for face-to-face consultation. Star Age, MD, PhD [facility])

## 2017-08-10 ENCOUNTER — Telehealth: Payer: Self-pay | Admitting: Neurology

## 2017-08-10 NOTE — Telephone Encounter (Signed)
Congratulated patient. He does think Levodopa is helping movement.

## 2017-08-10 NOTE — Telephone Encounter (Signed)
That is awesome!  Just make sure that he does it safely.  Does he think that carbidopa/levodopa helped?

## 2017-08-10 NOTE — Telephone Encounter (Signed)
Patient called and wanted Dr. Carles Collet to know that he went out yesterday and walked a mile. Thanks

## 2017-08-29 NOTE — Progress Notes (Signed)
Jeremiah [name] was seen today in the movement disorders clinic for neurologic consultation at the request of [name], MD.  This patient is accompanied in the office by his spouse who supplements the history. The consultation is for the evaluation of L hand tremor (pt denies tremor), gait instability and to r/o PD.  He currently sees Dr. Rexene Alberts and those notes are reviewed.  He primarily sees her for OSAS.    Specific Symptoms:  Tremor: No. (pt denies tremor but noted in medical records) Family hx of similar:  No. Voice: softer per pt/wife (worse over the last year) Sleep: trouble staying asleep (has nocturia but isn't only reason).  Wears cpap  Vivid Dreams:  No.  Acting out dreams:  No. Wet Pillows: No. (wears CPAP mask anyway) Postural symptoms:  Yes.   (mostly in yard when sitting will fall over)  Falls?  Yes.  , has had 2, the last a few weeks ago tripped over uneven place in driveway; other [place] the church and on uneven curb and walking between cars and hit head Bradykinesia symptoms: shuffling gait, slow movements, difficulty getting out of a chair and difficulty regaining balance (but these symptoms seem to come and go without reason) Loss of smell:  No. Loss of taste:  No. Urinary Incontinence:  No. Difficulty Swallowing:  No. Handwriting, micrographia: not necessarily small but "it's terrible because I can't get any flow" Trouble with ADL's:  No. (but sits to put on pants)  Trouble buttoning clothing: Yes.   (mild) Depression:  No. Memory changes:  No. Hallucinations:  No.  visual distortions: No. N/V:  No. Lightheaded:  No.  Syncope: No. Diplopia:  No. Dyskinesia:  No.  Neuroimaging of the brain has previously been performed.  It is available for my review today.  He had an MRI/MRA brain in 05/2012.  Radiology felt that size of ventricles was likely ex vacuo, but I reviewed those myself and felt that perhaps they were out of proportion to degree of atrophy.  05/29/17  update: Patient was seen today in follow-up.  He had a high volume lumbar puncture at the end of September.  Physical therapy was with him and reported no significant change.  However, we will make out the patient, he reported that he did markedly better after that.  In fact, he stated that he was still better several days after that.  He states that he is back to baseline now.   He made a follow-up appointment and is here to discuss how he feels.  He had a few falls, generally one time a month.  He has better and worse days.    08/31/17 update: Patient is seen today in follow-up.  We decided to give him a trial of levodopa since our last visit.  Today, he reports that he is unsure if the medication has helped.  He has walked a mile "at least 3 times" which is good for him.   Had a few falls but cannot remember when they were but they were since last visit.    PREVIOUS MEDICATIONS: none to date  ALLERGIES:  No Known Allergies  CURRENT MEDICATIONS:  Outpatient Encounter Medications as of 08/31/2017  Medication Sig  . acetaminophen (TYLENOL) 325 MG tablet Take 650 mg by mouth every 6 (six) hours as needed for mild pain or moderate pain.   [name] Kitchen aspirin EC 81 MG tablet Take 81 mg by mouth.  [name] Kitchen atorvastatin (LIPITOR) 20 MG tablet Take 20 mg  by mouth daily.  . Biotin 5000 MCG TABS Take 1 tablet by mouth daily.  . carbidopa-levodopa (SINEMET IR) 25-100 MG tablet Take 1 tablet by mouth 3 (three) times daily.  . Cholecalciferol (VITAMIN D3) 1000 units CAPS Take 2,000 Units by mouth daily.   . hydrochlorothiazide (MICROZIDE) 12.5 MG capsule Take 12.5 mg by mouth daily.   . Multiple Vitamins-Minerals (PRESERVISION AREDS 2 PO) Take 1 tablet by mouth daily.   . potassium chloride (K-DUR) 10 MEQ tablet Take 20 mEq by mouth daily.   . tamsulosin (FLOMAX) 0.4 MG CAPS capsule Take 0.4 mg by mouth daily.    No facility-administered encounter medications on file as of 08/31/2017.     PAST MEDICAL HISTORY:   Past  Medical History:  Diagnosis Date  . BPH (benign prostatic hyperplasia)   . Central retinal artery occlusion   . Diabetes mellitus without complication (HCC)    diet controlled  . Diverticulosis   . Hyperlipidemia   . Hypertension   . OSA on CPAP   . Osteoarthritis   . 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 S/P radiation therapy 04/23/13 - 05/29/14   Prostate/seminal vesicles, external beam 4500 cGy in 25 sessions  . Skin cancer    scalp    PAST SURGICAL HISTORY:   Past Surgical History:  Procedure Laterality Date  . BACK SURGERY  2009  . [place]  . PROSTATE BIOPSY  2013, 2014, 01/2014   Gleason 7  . RADIOACTIVE SEED IMPLANT N/A 07/01/2014   Procedure: RADIOACTIVE SEED IMPLANT;  Surgeon: [name], MD;  Location: [facility];  Service: Urology;  Laterality: N/A;  DR PORTABLE  . TONSILLECTOMY      SOCIAL HISTORY:   Social History   Socioeconomic History  . Marital status: Married    Spouse name: Not on file  . Number of children: Not on file  . Years of education: Not on file  . Highest education level: Not on file  Occupational History  . 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 Financial resource strain: Not on file  . Food insecurity:    Worry: Not on file    Inability: Not on file  . Transportation needs:    Medical: Not on file    Non-medical: Not on file  Tobacco Use  . Smoking status: Former Smoker    Last attempt to quit: 03/10/1961    Years since quitting: 56.5  . Smokeless tobacco: Never Used  Substance and Sexual Activity  . Alcohol use: Yes    Alcohol/week: 0.6 oz    Types: 1 Glasses of wine per week    Comment: rare alcohol  . Drug use: No  . Sexual activity: Not on file  Lifestyle  . Physical activity:    Days per week: Not on file    Minutes per session: Not on file  . Stress: Not on file  Relationships  . Social connections:    Talks on phone: Not on file      Gets together: Not on file    Attends religious service: Not on file    Active member of club or organization: Not on file    Attends meetings of clubs or organizations: Not on file    Relationship status: Not on file  . Intimate partner violence:    Fear of current or ex partner: Not on file  Emotionally abused: Not on file    Physically abused: Not on file    Forced sexual activity: Not on file  Other Topics Concern  . Not on file  Social History Narrative   Rare caffeine use     FAMILY HISTORY:   Family Status  Relation Name Status  . Father  Deceased at age 30       MI  . Annamarie Major 2 Deceased  . Mother  Deceased  . Sister  Alive  . Brother  Alive  . Son  Alive  . Daughter  Alive    ROS:  A complete 10 system review of systems was obtained and was unremarkable apart from what is mentioned above.  PHYSICAL EXAMINATION:    VITALS:   Vitals:   08/31/17 0904  BP: 130/88  Pulse: 70  SpO2: 95%  Weight: 179 lb (81.2 kg)  Height: 5\' 10"  (1.778 m)    GEN:  The patient appears stated age and is in NAD. HEENT:  Normocephalic, atraumatic.  The mucous membranes are moist. The superficial temporal arteries are without ropiness or tenderness. CV:  RRR Lungs:  CTAB Neck/HEME:  There are no carotid bruits bilaterally.  Neurological examination:  Orientation: The patient is alert and oriented x3.  Cranial nerves: There is good facial symmetry. EOMI. The visual fields are full to confrontational testing. The speech is fluent and clear. Soft palate rises symmetrically and there is no tongue deviation. Hearing is intact to conversational tone. Sensation: Sensation is intact to light touch x 4 Motor: Strength is antigravity x 4.  Movement examination: Tone: There is normal tone in the bilateral upper extremities.  The tone in the lower extremities is normal.  Abnormal movements: none seen.  Tremor can be slightly felt Coordination:  There is no significant decremation  with any form of RAMS, including alternating supination and pronation of the forearm, hand opening and closing, finger taps, heel taps and toe taps bilaterally Gait and Station: The patient easily arises.  He is slightly bent at the waist.  Purposeful arm swing, but decreased on the L with mild dragging of the L foot  ASSESSMENT/PLAN:  1.  Gait change and falls  -Not sure that this is parkinsonism and doesn't meet criteria for PD.  I still think that his ventricles are fairly large, even though he does have a significant amount of atrophy.  He had a high volume lumbar puncture and he felt that he got markedly better, but physical therapy did not feel that his change was significant, at least at the day the tap was performed.  He also is not sure that he had "placebo" effect.   -he is on carbidopa/levodopa 25/100 and unsure if helpful.  Decided to do DaT  -talked about shunting but both of [place] agreed to hold on that for now.  2.  F/u after above is completed.      Cc:  [name], MD

## 2017-08-31 ENCOUNTER — Encounter: Payer: Self-pay | Admitting: Neurology

## 2017-08-31 ENCOUNTER — Ambulatory Visit: Payer: Medicare Other | Admitting: Neurology

## 2017-08-31 VITALS — BP 130/88 | HR 70 | Ht 70.0 in | Wt 179.0 lb

## 2017-08-31 DIAGNOSIS — R2681 Unsteadiness on feet: Secondary | ICD-10-CM

## 2017-08-31 DIAGNOSIS — R251 Tremor, unspecified: Secondary | ICD-10-CM | POA: Diagnosis not present

## 2017-08-31 NOTE — Patient Instructions (Signed)
1. We have sent a referral to [facility] for your DAT scan and they will call you directly to schedule your appt.. They are located at [address] If you need to contact them directly please call [phone].

## 2017-09-06 ENCOUNTER — Telehealth: Payer: Self-pay | Admitting: Radiology

## 2017-10-04 ENCOUNTER — Encounter (HOSPITAL_COMMUNITY): Payer: Medicare Other

## 2017-10-10 DIAGNOSIS — H0012 Chalazion right lower eyelid: Secondary | ICD-10-CM | POA: Diagnosis not present

## 2017-10-11 ENCOUNTER — Encounter (HOSPITAL_COMMUNITY)
Admission: RE | Admit: 2017-10-11 | Discharge: 2017-10-11 | Disposition: A | Discharge status: Home / Self Care | Payer: Medicare Other | Source: Ambulatory Visit | Attending: Neurology | Admitting: Neurology

## 2017-10-11 ENCOUNTER — Other Ambulatory Visit (HOSPITAL_COMMUNITY): Payer: Medicare Other

## 2017-10-11 DIAGNOSIS — R251 Tremor, unspecified: Secondary | ICD-10-CM | POA: Diagnosis not present

## 2017-10-11 DIAGNOSIS — R296 Repeated falls: Secondary | ICD-10-CM | POA: Diagnosis not present

## 2017-10-11 DIAGNOSIS — R2681 Unsteadiness on feet: Secondary | ICD-10-CM | POA: Diagnosis not present

## 2017-10-11 MED ORDER — IOFLUPANE I 123 185 MBQ/2.5ML IV SOLN
4.5000 | Freq: Once | INTRAVENOUS | Status: AC
  Administered 2017-10-11: 4.5 via INTRAVENOUS

## 2017-10-11 MED ORDER — IODINE STRONG (LUGOLS) 5 % PO SOLN
0.8000 mL | Freq: Once | ORAL | Status: AC
  Administered 2017-10-11: 0.8 mL via ORAL
  Filled 2017-10-11: qty 0.8

## 2017-10-12 ENCOUNTER — Telehealth: Payer: Self-pay | Admitting: Neurology

## 2017-10-12 NOTE — Telephone Encounter (Signed)
-----   Message from [place], DO sent at 10/12/2017  7:32 AM EDT ----- Let pt know that his DaT scan is unremarkable

## 2017-10-12 NOTE — Telephone Encounter (Signed)
Patient made aware of results.  

## 2017-10-12 NOTE — Telephone Encounter (Signed)
Left message on machine for patient to call back.

## 2017-10-16 DIAGNOSIS — G4733 Obstructive sleep apnea (adult) (pediatric): Secondary | ICD-10-CM | POA: Diagnosis not present

## 2017-11-05 DIAGNOSIS — Z8546 Personal history of malignant neoplasm of prostate: Secondary | ICD-10-CM | POA: Diagnosis not present

## 2017-11-06 NOTE — Progress Notes (Signed)
Jeremiah [name] was seen today in the movement disorders clinic for neurologic consultation at the request of [name], MD.  This patient is accompanied in the office by his spouse who supplements the history. The consultation is for the evaluation of L hand tremor (pt denies tremor), gait instability and to r/o PD.  He currently sees Dr. Rexene Alberts and those notes are reviewed.  He primarily sees her for OSAS.    Specific Symptoms:  Tremor: No. (pt denies tremor but noted in medical records) Family hx of similar:  No. Voice: softer per pt/wife (worse over the last year) Sleep: trouble staying asleep (has nocturia but isn't only reason).  Wears cpap  Vivid Dreams:  No.  Acting out dreams:  No. Wet Pillows: No. (wears CPAP mask anyway) Postural symptoms:  Yes.   (mostly in yard when sitting will fall over)  Falls?  Yes.  , has had 2, the last a few weeks ago tripped over uneven place in driveway; other at the church and on uneven curb and walking between cars and hit head Bradykinesia symptoms: shuffling gait, slow movements, difficulty getting out of a chair and difficulty regaining balance (but these symptoms seem to come and go without reason) Loss of smell:  No. Loss of taste:  No. Urinary Incontinence:  No. Difficulty Swallowing:  No. Handwriting, micrographia: not necessarily small but "it's terrible because I can't get any flow" Trouble with ADL's:  No. (but sits to put on pants)  Trouble buttoning clothing: Yes.   (mild) Depression:  No. Memory changes:  No. Hallucinations:  No.  visual distortions: No. N/V:  No. Lightheaded:  No.  Syncope: No. Diplopia:  No. Dyskinesia:  No.  Neuroimaging of the brain has previously been performed.  It is available for my review today.  He had an MRI/MRA brain in 05/2012.  Radiology felt that size of ventricles was likely ex vacuo, but I reviewed those myself and felt that perhaps they were out of proportion to degree of atrophy.  05/29/17  update: Patient was seen today in follow-up.  He had a high volume lumbar puncture at the end of September.  Physical therapy was with him and reported no significant change.  However, we will make out the patient, he reported that he did markedly better after that.  In fact, he stated that he was still better several days after that.  He states that he is back to baseline now.   He made a follow-up appointment and is here to discuss how he feels.  He had a few falls, generally one time a month.  He has better and worse days.    08/31/17 update: Patient is seen today in follow-up.  We decided to give him a trial of levodopa since our last visit.  Today, he reports that he is unsure if the medication has helped.  He has walked a mile "at least 3 times" which is good for him.   Had a few falls but cannot remember when they were but they were since last visit.    11/06/17 update: Patient is seen today in follow-up.  DaT scan was completed on Oct 11, 2017.  It was reported to be normal, although there was slight decreased activity in the left stratum compared to that of the right.  He is still on carbidopa/levodopa 25/100, 1 tablet 3 times per day.  Using cane now.  No falls but has to concentrate on walking to be stable.  Going  to [facility]  PREVIOUS MEDICATIONS: none to date  ALLERGIES:  No Known Allergies  CURRENT MEDICATIONS:  Outpatient Encounter Medications as of 11/07/2017  Medication Sig  . acetaminophen (TYLENOL) 325 MG tablet Take 650 mg by mouth every 6 (six) hours as needed for mild pain or moderate pain.   [name] Kitchen aspirin EC 81 MG tablet Take 81 mg by mouth.  [name] Kitchen atorvastatin (LIPITOR) 20 MG tablet Take 20 mg by mouth daily.  . Biotin 5000 MCG TABS Take 1 tablet by mouth daily.  . carbidopa-levodopa (SINEMET IR) 25-100 MG tablet Take 1 tablet by mouth 3 (three) times daily.  . Cholecalciferol (VITAMIN D3) 1000 units CAPS Take 2,000 Units by mouth daily.   . hydrochlorothiazide (MICROZIDE) 12.5 MG capsule  Take 12.5 mg by mouth daily.   . Multiple Vitamins-Minerals (PRESERVISION AREDS 2 PO) Take 1 tablet by mouth daily.   . potassium chloride (K-DUR) 10 MEQ tablet Take 20 mEq by mouth daily.   . tamsulosin (FLOMAX) 0.4 MG CAPS capsule Take 0.4 mg by mouth daily.   [name] Kitchen neomycin-polymyxin b-dexamethasone (MAXITROL) 3.5-10000-0.1 OINT    No facility-administered encounter medications on file as of 11/07/2017.     PAST MEDICAL HISTORY:   Past Medical History:  Diagnosis Date  . BPH (benign prostatic hyperplasia)   . Central retinal artery occlusion   . Diabetes mellitus without complication (HCC)    diet controlled  . Diverticulosis   . Hyperlipidemia   . Hypertension   . OSA on CPAP   . Osteoarthritis   . 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 S/P radiation therapy 04/23/13 - 05/29/14   Prostate/seminal vesicles, external beam 4500 cGy in 25 sessions  . Skin cancer    scalp    PAST SURGICAL HISTORY:   Past Surgical History:  Procedure Laterality Date  . BACK SURGERY  2009  . [place]  . PROSTATE BIOPSY  2013, 2014, 01/2014   Gleason 7  . RADIOACTIVE SEED IMPLANT N/A 07/01/2014   Procedure: RADIOACTIVE SEED IMPLANT;  Surgeon: [name], MD;  Location: [facility];  Service: Urology;  Laterality: N/A;  DR PORTABLE  . TONSILLECTOMY      SOCIAL HISTORY:   Social History   Socioeconomic History  . Marital status: Married    Spouse name: Not on file  . Number of children: Not on file  . Years of education: Not on file  . Highest education level: Not on file  Occupational History  . 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 Financial resource strain: Not on file  . Food insecurity:    Worry: Not on file    Inability: Not on file  . Transportation needs:    Medical: Not on file    Non-medical: Not on file  Tobacco Use  . Smoking status: Former Smoker    Last attempt to quit: 03/10/1961     Years since quitting: 56.7  . Smokeless tobacco: Never Used  Substance and Sexual Activity  . Alcohol use: Yes    Alcohol/week: 0.6 oz    Types: 1 Glasses of wine per week    Comment: rare alcohol  . Drug use: No  . Sexual activity: Not on file  Lifestyle  . Physical activity:    Days per week: Not on file    Minutes per session: Not on file  . Stress: Not on file  Relationships  .  Social connections:    Talks on phone: Not on file    Gets together: Not on file    Attends religious service: Not on file    Active member of club or organization: Not on file    Attends meetings of clubs or organizations: Not on file    Relationship status: Not on file  . Intimate partner violence:    Fear of current or ex partner: Not on file    Emotionally abused: Not on file    Physically abused: Not on file    Forced sexual activity: Not on file  Other Topics Concern  . Not on file  Social History Narrative   Rare caffeine use     FAMILY HISTORY:   Family Status  Relation Name Status  . Father  Deceased at age 51       MI  . Annamarie Major 2 Deceased  . Mother  Deceased  . Sister  Alive  . Brother  Alive  . Son  Alive  . Daughter  Alive    ROS:  Review of Systems  Constitutional: Negative.   HENT: Negative.   Eyes: Negative.   Respiratory: Negative.   Cardiovascular: Negative.   Gastrointestinal: Negative.   Genitourinary: Negative.   Musculoskeletal: Negative.   Skin: Negative.      PHYSICAL EXAMINATION:    VITALS:   Vitals:   11/07/17 0930  BP: 112/80  Pulse: 84  SpO2: 91%  Weight: 180 lb (81.6 kg)  Height: 5\' 10"  (1.778 m)    GEN:  The patient appears stated age and is in NAD.  Cries easily with storytelling HEENT:  Normocephalic, atraumatic.  The mucous membranes are moist. The superficial temporal arteries are without ropiness or tenderness. CV:  RRR Lungs:  CTAB Neck/HEME:  There are no carotid bruits bilaterally.  Neurological examination:  Orientation:  The patient is alert and oriented x3. Cranial nerves: There is good facial symmetry. The speech is fluent and clear. Soft palate rises symmetrically and there is no tongue deviation. Hearing is intact to conversational tone. Sensation: Sensation is intact to light touch throughout Motor: Strength is 5/5 in the bilateral upper and lower extremities.   Shoulder shrug is equal and symmetric.  There is no pronator drift.  Movement examination: Tone: There is normal tone in the UE/LE Abnormal movements: none Coordination:  There is no decremation with RAM's, with any form of RAMS, including alternating supination and pronation of the forearm, hand opening and closing, finger taps, heel taps and toe taps. Gait and Station: The patient has minimal difficulty arising out of a deep-seated chair without the use of the hands. The patient's stride length is slightly decreased, no shuffling, bent at the waist.    ASSESSMENT/PLAN:  1.  Gait change and falls  -He does not meet criteria for Parkinson's disease and DaT scan was unremarkable.  He and I discussed sensitivity/specificity of DaT scan.  Not surprisingly, he does not think levodopa has been all that beneficial and we ultimately decided to stop that.    I still think that his ventricles are fairly large, even though he does have a significant amount of atrophy.  He had a high volume lumbar puncture and he felt that he got markedly better, but physical therapy did not feel that his change was significant, at least at the day the tap was performed.  He also is not sure that he had "placebo" effect.   -he is going to [employer] [facility] 3  days a week and he should continue this.  -offered patient 3 options: 2nd opinion with movement disorders, opinion with neurosurgery, or take a wait and see approach.  Ultimately, he decided to just wait and see how he did.  He and I did discuss scanning him again in about 10 months and following up in a year.  -handicap placard filled  out  -he will call me if new neuro issues arise or if he wants the 2nd opinion     Cc:  [name], MD

## 2017-11-07 ENCOUNTER — Ambulatory Visit: Payer: Medicare Other | Admitting: Neurology

## 2017-11-07 ENCOUNTER — Encounter: Payer: Self-pay | Admitting: Neurology

## 2017-11-07 VITALS — BP 112/80 | HR 84 | Ht 70.0 in | Wt 180.0 lb

## 2017-11-07 DIAGNOSIS — R2681 Unsteadiness on feet: Secondary | ICD-10-CM | POA: Diagnosis not present

## 2017-11-07 NOTE — Patient Instructions (Signed)
Decrease carbidopa/levodopa 25/100 to twice a day for a week, then once per day for a week and then stop the medication  Call me if you decide that you want a second opinion and I will arrange that for you  Keep exercising!

## 2017-11-12 DIAGNOSIS — C61 Malignant neoplasm of prostate: Secondary | ICD-10-CM | POA: Diagnosis not present

## 2017-11-12 DIAGNOSIS — N3943 Post-void dribbling: Secondary | ICD-10-CM | POA: Diagnosis not present

## 2017-11-12 DIAGNOSIS — R3915 Urgency of urination: Secondary | ICD-10-CM | POA: Diagnosis not present

## 2017-11-16 DIAGNOSIS — H353112 Nonexudative age-related macular degeneration, right eye, intermediate dry stage: Secondary | ICD-10-CM | POA: Diagnosis not present

## 2017-11-16 DIAGNOSIS — H0012 Chalazion right lower eyelid: Secondary | ICD-10-CM | POA: Diagnosis not present

## 2017-11-16 DIAGNOSIS — H2513 Age-related nuclear cataract, bilateral: Secondary | ICD-10-CM | POA: Diagnosis not present

## 2017-11-16 DIAGNOSIS — E119 Type 2 diabetes mellitus without complications: Secondary | ICD-10-CM | POA: Diagnosis not present

## 2018-01-31 DIAGNOSIS — R7301 Impaired fasting glucose: Secondary | ICD-10-CM | POA: Diagnosis not present

## 2018-01-31 DIAGNOSIS — Z125 Encounter for screening for malignant neoplasm of prostate: Secondary | ICD-10-CM | POA: Diagnosis not present

## 2018-01-31 DIAGNOSIS — E7849 Other hyperlipidemia: Secondary | ICD-10-CM | POA: Diagnosis not present

## 2018-01-31 DIAGNOSIS — I10 Essential (primary) hypertension: Secondary | ICD-10-CM | POA: Diagnosis not present

## 2018-01-31 DIAGNOSIS — R82998 Other abnormal findings in urine: Secondary | ICD-10-CM | POA: Diagnosis not present

## 2018-02-06 DIAGNOSIS — Z0000 Encounter for general adult medical examination without abnormal findings: Secondary | ICD-10-CM | POA: Diagnosis not present

## 2018-02-06 DIAGNOSIS — G20 Parkinson's disease: Secondary | ICD-10-CM | POA: Diagnosis not present

## 2018-02-06 DIAGNOSIS — C61 Malignant neoplasm of prostate: Secondary | ICD-10-CM | POA: Diagnosis not present

## 2018-02-06 DIAGNOSIS — I10 Essential (primary) hypertension: Secondary | ICD-10-CM | POA: Diagnosis not present

## 2018-02-06 DIAGNOSIS — Z23 Encounter for immunization: Secondary | ICD-10-CM | POA: Diagnosis not present

## 2018-02-10 NOTE — Progress Notes (Signed)
Cardiology Office Note   Date:  02/11/2018   ID:  IANMICHAEL AMESCUA, DOB [birth year], MRN [id number]  PCP:  [name], MD  Cardiologist:   Peter [name], MD   Chief Complaint  Patient presents with  . Shortness of Breath      History of Present Illness: Jeremiah [name] is a 77 y.o. male who is seen at the request of Dr. Joylene Draft for evaluation of dyspnea and fatigue. He has a history of HTN, HLD, and OSA on CPAP. He reports I saw him 9 years ago and that pharmacologic stress test was normal. This was to clear him for back surgery. He also has a history of DM diet controlled and family history of CAD. More recently he notes some symptoms of fatigue and dyspnea. History is somewhat vague. No chest pain. 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 Reports BP and sugar have been controlled. Does not really know about his medication- states his wife "takes care of all that" . With his gait difficulties his activity is limited.    Past Medical History:  Diagnosis Date  . BPH (benign prostatic hyperplasia)   . Central retinal artery occlusion   . Diabetes mellitus without complication (HCC)    diet controlled  . Diverticulosis   . Hyperlipidemia   . Hypertension   . OSA on CPAP   . Osteoarthritis   . 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 S/P radiation therapy 04/23/13 - 05/29/14   Prostate/seminal vesicles, external beam 4500 cGy in 25 sessions  . Skin cancer    scalp    Past Surgical History:  Procedure Laterality Date  . BACK SURGERY  2009  . [place]  . PROSTATE BIOPSY  2013, 2014, 01/2014   Gleason 7  . RADIOACTIVE SEED IMPLANT N/A 07/01/2014   Procedure: RADIOACTIVE SEED IMPLANT;  Surgeon: [name], MD;  Location: [facility];  Service: Urology;  Laterality: N/A;  DR PORTABLE  . TONSILLECTOMY       Current Outpatient Medications  Medication  Sig Dispense Refill  . atorvastatin (LIPITOR) 20 MG tablet Take 20 mg by mouth daily.    . Biotin 5000 MCG TABS Take 1 tablet by mouth daily.    . carbidopa-levodopa (SINEMET IR) 25-100 MG tablet Take 1 tablet by mouth 3 (three) times daily. 270 tablet 1  . Cholecalciferol (VITAMIN D3) 1000 units CAPS Take 2,000 Units by mouth daily.     . hydrochlorothiazide (MICROZIDE) 12.5 MG capsule Take 12.5 mg by mouth daily.   0  . Multiple Vitamins-Minerals (PRESERVISION AREDS 2 PO) Take 1 tablet by mouth daily.     [name] Kitchen neomycin-polymyxin b-dexamethasone (MAXITROL) 3.5-10000-0.1 OINT   1  . potassium chloride (K-DUR) 10 MEQ tablet Take 20 mEq by mouth daily.   2   No current facility-administered medications for this visit.     Allergies:   Patient has no known allergies.    Social History:  The patient  reports that he quit smoking about 56 years ago. He has never used smokeless tobacco. He reports that he drinks about 1.0 standard drinks of alcohol per week. He reports that he does not use drugs.   Family History:  The patient's family history includes Cancer in his father and paternal uncle; Dementia (age of onset: 43) in his mother; Diabetes in his father; Heart attack (age  of onset: 26) in his father; Lung cancer in his brother.    ROS:  Please see the history of present illness.   Otherwise, review of systems are positive for none.   All other systems are reviewed and negative.    PHYSICAL EXAM: VS:  BP 121/78 (BP Location: Left Arm)   Pulse 86   Ht 5\' 10"  (1.778 m)   Wt 185 lb 6.4 oz (84.1 kg)   BMI 26.60 kg/m  , BMI Body mass index is 26.6 kg/m. GEN: Well nourished, well developed, in no acute distress  HEENT: normal  Neck: no JVD, carotid bruits, or masses Cardiac: RRR; no murmurs, rubs, or gallops,no edema  Respiratory:  clear to auscultation bilaterally, normal work of breathing GI: soft, nontender, nondistended, + BS, central obesity. MS: no deformity or atrophy  Skin: warm  and dry, no rash Neuro:  Nonfocal. Walks with a cane. Psych: euthymic mood, full affect   EKG:  EKG is ordered today. The ekg ordered today demonstrates NSR rate 86. Low voltage. I have personally reviewed and interpreted this study.    Recent Labs: No results found for requested labs within last 8760 hours.    Lipid Panel No results found for: CHOL, TRIG, HDL, CHOLHDL, VLDL, LDLCALC, LDLDIRECT    Labs dated 01/31/18: cholesterol 220, triglycerides 138, HDL 51, LDL 141. A1c 5.4%. Creatinine 1.3. Other chemistries, CBC, TSH normal.   Wt Readings from Last 3 Encounters:  02/11/18 185 lb 6.4 oz (84.1 kg)  11/07/17 180 lb (81.6 kg)  08/31/17 179 lb (81.2 kg)      Other studies Reviewed: Additional studies/ records that were reviewed today include:none    ASSESSMENT AND PLAN:  1.  Fatigue and some DOE. Possible anginal equivalent in patient with multiple cardiac risk factors. Exam and Ecg are benign. Will arrange for a lexiscan Myoview study 2. DM type 2 diet controlled. 3. HTN controlled 4. Hypercholesterolemia. LDL 140. Patient unclear about lipitor dose. Will defer to Dr. Joylene Draft 5. OSA on CPAP  6. Movement disorder.    Current medicines are reviewed at length with the patient today.  The patient does not have concerns regarding medicines.  The following changes have been made:  no change  Labs/ tests ordered today include: Lexiscan myoview.   Signed, Peter [name], MD  02/11/2018 1:45 PM    [facility] [address] [place], [place], [address] Phone [phone], Fax [phone]

## 2018-02-11 ENCOUNTER — Encounter: Payer: Self-pay | Admitting: Cardiology

## 2018-02-11 ENCOUNTER — Ambulatory Visit: Payer: Medicare Other | Admitting: Cardiology

## 2018-02-11 VITALS — BP 121/78 | HR 86 | Ht 70.0 in | Wt 185.4 lb

## 2018-02-11 DIAGNOSIS — E7800 Pure hypercholesterolemia, unspecified: Secondary | ICD-10-CM

## 2018-02-11 DIAGNOSIS — I10 Essential (primary) hypertension: Secondary | ICD-10-CM | POA: Diagnosis not present

## 2018-02-11 DIAGNOSIS — R0609 Other forms of dyspnea: Secondary | ICD-10-CM

## 2018-02-11 DIAGNOSIS — R0600 Dyspnea, unspecified: Secondary | ICD-10-CM

## 2018-02-11 DIAGNOSIS — E119 Type 2 diabetes mellitus without complications: Secondary | ICD-10-CM | POA: Diagnosis not present

## 2018-02-11 NOTE — Addendum Note (Signed)
Addended by: Kathyrn Lass on: 02/11/2018 01:53 PM   Modules accepted: Orders

## 2018-02-11 NOTE — Patient Instructions (Signed)
We will  Schedule you for a Nuclear stress test

## 2018-02-12 ENCOUNTER — Telehealth (HOSPITAL_COMMUNITY): Payer: Self-pay

## 2018-02-12 NOTE — Telephone Encounter (Signed)
Encounter complete. 

## 2018-02-14 ENCOUNTER — Ambulatory Visit (HOSPITAL_COMMUNITY)
Admission: RE | Admit: 2018-02-14 | Discharge: 2018-02-14 | Disposition: A | Discharge status: Home / Self Care | Payer: Medicare Other | Source: Ambulatory Visit | Attending: Cardiology | Admitting: Cardiology

## 2018-02-14 DIAGNOSIS — E7800 Pure hypercholesterolemia, unspecified: Secondary | ICD-10-CM | POA: Insufficient documentation

## 2018-02-14 DIAGNOSIS — R0609 Other forms of dyspnea: Secondary | ICD-10-CM | POA: Diagnosis not present

## 2018-02-14 DIAGNOSIS — E119 Type 2 diabetes mellitus without complications: Secondary | ICD-10-CM

## 2018-02-14 DIAGNOSIS — I10 Essential (primary) hypertension: Secondary | ICD-10-CM | POA: Insufficient documentation

## 2018-02-14 LAB — MYOCARDIAL PERFUSION IMAGING
LV dias vol: 90 mL (ref 62–150)
LV sys vol: 33 mL
Peak HR: 93 {beats}/min
Rest HR: 67 {beats}/min
SDS: 2
SRS: 1
SSS: 3
TID: 0.96

## 2018-02-14 MED ORDER — TECHNETIUM TC 99M TETROFOSMIN IV KIT
10.4000 | PACK | Freq: Once | INTRAVENOUS | Status: AC | PRN
  Administered 2018-02-14: 10.4 via INTRAVENOUS
  Filled 2018-02-14: qty 11

## 2018-02-14 MED ORDER — TECHNETIUM TC 99M TETROFOSMIN IV KIT
31.7000 | PACK | Freq: Once | INTRAVENOUS | Status: AC | PRN
  Administered 2018-02-14: 31.7 via INTRAVENOUS
  Filled 2018-02-14: qty 32

## 2018-02-14 MED ORDER — REGADENOSON 0.4 MG/5ML IV SOLN
0.4000 mg | Freq: Once | INTRAVENOUS | Status: AC
  Administered 2018-02-14: 0.4 mg via INTRAVENOUS

## 2018-02-19 DIAGNOSIS — M79604 Pain in right leg: Secondary | ICD-10-CM | POA: Diagnosis not present

## 2018-02-19 DIAGNOSIS — S50812A Abrasion of left forearm, initial encounter: Secondary | ICD-10-CM | POA: Diagnosis not present

## 2018-02-19 DIAGNOSIS — R296 Repeated falls: Secondary | ICD-10-CM | POA: Diagnosis not present

## 2018-02-19 DIAGNOSIS — I10 Essential (primary) hypertension: Secondary | ICD-10-CM | POA: Diagnosis not present

## 2018-02-23 DIAGNOSIS — M79661 Pain in right lower leg: Secondary | ICD-10-CM | POA: Diagnosis not present

## 2018-02-23 DIAGNOSIS — M7071 Other bursitis of hip, right hip: Secondary | ICD-10-CM | POA: Diagnosis not present

## 2018-03-05 ENCOUNTER — Telehealth: Payer: Self-pay | Admitting: Neurology

## 2018-03-05 NOTE — Telephone Encounter (Signed)
ERROR

## 2018-03-07 DIAGNOSIS — S76311D Strain of muscle, fascia and tendon of the posterior muscle group at thigh level, right thigh, subsequent encounter: Secondary | ICD-10-CM | POA: Diagnosis not present

## 2018-03-07 DIAGNOSIS — S86111D Strain of other muscle(s) and tendon(s) of posterior muscle group at lower leg level, right leg, subsequent encounter: Secondary | ICD-10-CM | POA: Diagnosis not present

## 2018-04-08 ENCOUNTER — Telehealth: Payer: Self-pay | Admitting: Neurology

## 2018-04-08 NOTE — Telephone Encounter (Signed)
Appt made

## 2018-04-08 NOTE — Telephone Encounter (Signed)
Bring in tomorrow at NP slot at 8:45.  However, if new lateralizing weakness or paresthesias or speech change, go to ER.

## 2018-04-08 NOTE — Progress Notes (Signed)
Jeremiah [name] was seen today in the movement disorders clinic for neurologic consultation at the request of [name], MD.  This patient is accompanied in the office by his spouse who supplements the history. The consultation is for the evaluation of L hand tremor (pt denies tremor), gait instability and to r/o PD.  He currently sees Dr. Rexene Alberts and those notes are reviewed.  He primarily sees her for OSAS.    Specific Symptoms:  Tremor: No. (pt denies tremor but noted in medical records) Family hx of similar:  No. Voice: softer per pt/wife (worse over the last year) Sleep: trouble staying asleep (has nocturia but isn't only reason).  Wears cpap  Vivid Dreams:  No.  Acting out dreams:  No. Wet Pillows: No. (wears CPAP mask anyway) Postural symptoms:  Yes.   (mostly in yard when sitting will fall over)  Falls?  Yes.  , has had 2, the last a few weeks ago tripped over uneven place in driveway; other [place] and on uneven curb and walking between cars and hit head Bradykinesia symptoms: shuffling gait, slow movements, difficulty getting out of a chair and difficulty regaining balance (but these symptoms seem to come and go without reason) Loss of smell:  No. Loss of taste:  No. Urinary Incontinence:  No. Difficulty Swallowing:  No. Handwriting, micrographia: not necessarily small but "it's terrible because I can't get any flow" Trouble with ADL's:  No. (but sits to put on pants)  Trouble buttoning clothing: Yes.   (mild) Depression:  No. Memory changes:  No. Hallucinations:  No.  visual distortions: No. N/V:  No. Lightheaded:  No.  Syncope: No. Diplopia:  No. Dyskinesia:  No.  Neuroimaging of the brain has previously been performed.  It is available for my review today.  He had an MRI/MRA brain in 05/2012.  Radiology felt that size of ventricles was likely ex vacuo, but I reviewed those myself and felt that perhaps they were out of proportion to degree of atrophy.  05/29/17  update: Patient was seen today in follow-up.  He had a high volume lumbar puncture at the end of September.  Physical therapy was with him and reported no significant change.  However, we will make out the patient, he reported that he did markedly better after that.  In fact, he stated that he was still better several days after that.  He states that he is back to baseline now.   He made a follow-up appointment and is here to discuss how he feels.  He had a few falls, generally one time a month.  He has better and worse days.    08/31/17 update: Patient is seen today in follow-up.  We decided to give him a trial of levodopa since our last visit.  Today, he reports that he is unsure if the medication has helped.  He has walked a mile "at least 3 times" which is good for him.   Had a few falls but cannot remember when they were but they were since last visit.    11/06/17 update: Patient is seen today in follow-up.  DaT scan was completed on Oct 11, 2017.  It was reported to be normal, although there was slight decreased activity in the left stratum compared to that of the right.  He is still on carbidopa/levodopa 25/100, 1 tablet 3 times per day.  Using cane now.  No falls but has to concentrate on walking to be stable.  Going  to ACT  04/09/18 update: Patient is seen today as a work in for gait change.  I took the patient off of levodopa last visit, and he states that from June until august, when he had a small tear in his hamstring.  However, he still did pretty well.  Suddenly, this past Saturday, he woke up and he noticed an acute worsening of symptoms.  He has trouble picking up his feet.  He has been using a cane though for several months due to "instability" in walking.  Several near falls.   He is still in physical therapy 3 days/week.  He is able to control the bladder.  Memory has been good.  No visual distortions/hallucinations.  His last labs were about 2 months ago.  No new medications, including OTC  meds.  PREVIOUS MEDICATIONS: none to date  ALLERGIES:  No Known Allergies  CURRENT MEDICATIONS:  Outpatient Encounter Medications as of 04/09/2018  Medication Sig  . atorvastatin (LIPITOR) 20 MG tablet Take 20 mg by mouth daily.  . Biotin 5000 MCG TABS Take 1 tablet by mouth daily.  . Cholecalciferol (VITAMIN D3) 1000 units CAPS Take 2,000 Units by mouth daily.   . hydrochlorothiazide (MICROZIDE) 12.5 MG capsule Take 12.5 mg by mouth daily.   . Multiple Vitamins-Minerals (PRESERVISION AREDS 2 PO) Take 1 tablet by mouth daily.   [name] Kitchen neomycin-polymyxin b-dexamethasone (MAXITROL) 3.5-10000-0.1 OINT   . potassium chloride (K-DUR) 10 MEQ tablet Take 20 mEq by mouth daily.   . [DISCONTINUED] carbidopa-levodopa (SINEMET IR) 25-100 MG tablet Take 1 tablet by mouth 3 (three) times daily.   No facility-administered encounter medications on file as of 04/09/2018.     PAST MEDICAL HISTORY:   Past Medical History:  Diagnosis Date  . BPH (benign prostatic hyperplasia)   . Central retinal artery occlusion   . Diabetes mellitus without complication (HCC)    diet controlled  . Diverticulosis   . Hyperlipidemia   . Hypertension   . OSA on CPAP   . Osteoarthritis   . 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 S/P radiation therapy 04/23/13 - 05/29/14   Prostate/seminal vesicles, external beam 4500 cGy in 25 sessions  . Skin cancer    scalp    PAST SURGICAL HISTORY:   Past Surgical History:  Procedure Laterality Date  . BACK SURGERY  2009  . [place]  . PROSTATE BIOPSY  2013, 2014, 01/2014   Gleason 7  . RADIOACTIVE SEED IMPLANT N/A 07/01/2014   Procedure: RADIOACTIVE SEED IMPLANT;  Surgeon: [name], MD;  Location: [facility];  Service: Urology;  Laterality: N/A;  DR PORTABLE  . TONSILLECTOMY      SOCIAL HISTORY:   Social History   Socioeconomic History  . Marital status: Married    Spouse name: Not on file  . Number of children: 2   . Years of education: Not on file  . Highest education level: Not on file  Occupational History  . 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 Financial resource strain: Not on file  . Food insecurity:    Worry: Not on file    Inability: Not on file  . Transportation needs:    Medical: Not on file    Non-medical: Not on file  Tobacco Use  . Smoking status: Former Smoker    Last attempt to quit: 03/10/1961    Years since quitting: 57.1  .  Smokeless tobacco: Never Used  Substance and Sexual Activity  . Alcohol use: Yes    Alcohol/week: 1.0 standard drinks    Types: 1 Glasses of wine per week    Comment: rare alcohol  . Drug use: No  . Sexual activity: Not on file  Lifestyle  . Physical activity:    Days per week: Not on file    Minutes per session: Not on file  . Stress: Not on file  Relationships  . Social connections:    Talks on phone: Not on file    Gets together: Not on file    Attends religious service: Not on file    Active member of club or organization: Not on file    Attends meetings of clubs or organizations: Not on file    Relationship status: Not on file  . Intimate partner violence:    Fear of current or ex partner: Not on file    Emotionally abused: Not on file    Physically abused: Not on file    Forced sexual activity: Not on file  Other Topics Concern  . Not on file  Social History Narrative   Rare caffeine use     FAMILY HISTORY:   Family Status  Relation Name Status  . Father  Deceased at age 58       MI  . Annamarie Major 2 Deceased  . Mother  Deceased  . Sister  Alive  . Brother  Alive  . Son  Alive  . Daughter  Alive    ROS:  Review of Systems  Constitutional: Negative.   HENT: Negative.   Eyes: Negative.   Cardiovascular: Negative.   Gastrointestinal: Negative.   Genitourinary: Negative.   Skin: Negative.   Endo/Heme/Allergies: Negative.      PHYSICAL EXAMINATION:    VITALS:   Vitals:   04/09/18  0828  BP: 120/80  Pulse: (!) 102  SpO2: 92%  Weight: 181 lb (82.1 kg)  Height: 5\' 9"  (1.753 m)    GEN:  The patient appears stated age and is in NAD.   HEENT:  Normocephalic, atraumatic.  The mucous membranes are moist. The superficial temporal arteries are without ropiness or tenderness. CV:  Tachy.  regular Lungs:  CTAB Neck/HEME:  There are no carotid bruits bilaterally.  Neurological examination:  Orientation: The patient is alert and oriented x3. Cranial nerves: There is good facial symmetry. The speech is fluent and clear. Soft palate rises symmetrically and there is no tongue deviation. Hearing is intact to conversational tone. Sensation: Sensation is intact to light touch throughout Motor: Strength is 5/5 in the bilateral upper and lower extremities.   Shoulder shrug is equal and symmetric.  There is no pronator drift.  Movement examination: Tone: There is normal tone in the UE/LE Abnormal movements: none Coordination:  There is slight decremation with finger taps on the left.  He has trouble with toe and heel taps on the left.  Rapid alternating movements are good on the right. Gait and Station: The patient has trouble arising OOC without the use of the hands.  He pushes off of the chair.  He has start hesitation.  He is very short stepped and bends at the waist.  He turns en bloc.  He ambulates with his cane.  Following administration of 250 mg of levodopa, his walking was very similar.  Labs: Last lab work was completed on January 31, 2018.  I was able to review this.  Sodium was 145, potassium  3.7, chloride 104, CO2 29, BUN 28, creatinine 1.3, white blood cells 9.8, hemoglobin 16.5, hematocrit 51.4, platelets 173.  TSH is 0.92.  Hemoglobin A1c 5.4.  ASSESSMENT/PLAN:  1.  Gait change and falls  -He does look worse today, but has had a previous DaTscan that was negative.  He has not found levodopa to be effective in the past.  However, looked worse today and gave him some  in the office today and there was not a significant change.  I wonder if he does not have vascular parkinsonism.  Discussed that this is often termed lower body parkinsonism.  Would not expect that this would be so acute, however.  -given acute changes, will do labs today including UA, chem, b12, cbc, tsh  -He has had fairly large ventricles and has had high-volume lumbar puncture in the past.  Given that, we will repeat his CT brain today.  -If his labs are unrevealing, then likely vascular in nature but did offer 2nd opinion with movement d/o or opinion with neurosx.  2.  Much greater than 50% of this visit was spent in counseling and coordinating care.  Total face to face time:  60 min     Cc:  [name], MD

## 2018-04-08 NOTE — Telephone Encounter (Signed)
Patient is calling in stating that he has went down hill over the weekend. He is now having a really hard time walking. Please call him back at [phone]. He wasn't sure if he needed an appt or not. Thanks!

## 2018-04-08 NOTE — Telephone Encounter (Signed)
Spoke with patient.  He states he has been doing okay since last seen in June, but slowly having more difficulty with walking. Suddenly worse this weekend with ability to pick up his feet. No change in medications. No recent illnesses. No signs/symptoms of UTI. Still in physical therapy three times a week.   Please advise.

## 2018-04-09 ENCOUNTER — Encounter: Payer: Self-pay | Admitting: Neurology

## 2018-04-09 ENCOUNTER — Other Ambulatory Visit (INDEPENDENT_AMBULATORY_CARE_PROVIDER_SITE_OTHER): Payer: Medicare Other

## 2018-04-09 ENCOUNTER — Ambulatory Visit: Payer: Medicare Other | Admitting: Neurology

## 2018-04-09 ENCOUNTER — Telehealth: Payer: Self-pay | Admitting: Neurology

## 2018-04-09 ENCOUNTER — Ambulatory Visit
Admission: RE | Admit: 2018-04-09 | Discharge: 2018-04-09 | Disposition: A | Discharge status: Home / Self Care | Payer: Medicare Other | Source: Ambulatory Visit | Attending: Neurology | Admitting: Neurology

## 2018-04-09 VITALS — BP 120/80 | HR 102 | Ht 69.0 in | Wt 181.0 lb

## 2018-04-09 DIAGNOSIS — G919 Hydrocephalus, unspecified: Secondary | ICD-10-CM | POA: Diagnosis not present

## 2018-04-09 DIAGNOSIS — Z5181 Encounter for therapeutic drug level monitoring: Secondary | ICD-10-CM

## 2018-04-09 DIAGNOSIS — E876 Hypokalemia: Secondary | ICD-10-CM | POA: Diagnosis not present

## 2018-04-09 DIAGNOSIS — W19XXXD Unspecified fall, subsequent encounter: Secondary | ICD-10-CM

## 2018-04-09 DIAGNOSIS — R2681 Unsteadiness on feet: Secondary | ICD-10-CM

## 2018-04-09 DIAGNOSIS — R6889 Other general symptoms and signs: Secondary | ICD-10-CM | POA: Diagnosis not present

## 2018-04-09 DIAGNOSIS — D696 Thrombocytopenia, unspecified: Secondary | ICD-10-CM

## 2018-04-09 DIAGNOSIS — G214 Vascular parkinsonism: Secondary | ICD-10-CM

## 2018-04-09 MED ORDER — CARBIDOPA-LEVODOPA 25-100 MG PO TABS
2.5000 | ORAL_TABLET | Freq: Once | ORAL | Status: AC
  Administered 2018-04-09: 2.5 via ORAL

## 2018-04-09 NOTE — Patient Instructions (Signed)
Your provider has requested that you have labwork completed today. Please go to [facility] ([id number]) on the second floor of this building before leaving the office today. You do not need to check in. If you are not called within 15 minutes please check with the front desk.   

## 2018-04-09 NOTE — Addendum Note (Signed)
Addended byAnnamaria Helling on: 04/09/2018 09:40 AM   Modules accepted: Orders

## 2018-04-09 NOTE — Telephone Encounter (Signed)
Jeremiah Clarks, DO sent to [name], CMA        Let pt know that I reviewed CT. Had not changed from prior one. Ventricles are large but not bigger than previous

## 2018-04-09 NOTE — Telephone Encounter (Signed)
[facility] lmom regarding this patient and report being in Epic and ready for viewing. Thanks

## 2018-04-09 NOTE — Telephone Encounter (Signed)
Patient made aware imaging is unchanged from prior scan.

## 2018-04-10 ENCOUNTER — Telehealth: Payer: Self-pay | Admitting: Neurology

## 2018-04-10 LAB — CBC WITH DIFFERENTIAL/PLATELET
Basophils Absolute: 41 cells/uL (ref 0–200)
Basophils Relative: 0.7 %
Eosinophils Absolute: 112 cells/uL (ref 15–500)
Eosinophils Relative: 1.9 %
HCT: 49.4 % (ref 38.5–50.0)
Hemoglobin: 17.1 g/dL (ref 13.2–17.1)
Lymphs Abs: 962 cells/uL (ref 850–3900)
MCH: 31.7 pg (ref 27.0–33.0)
MCHC: 34.6 g/dL (ref 32.0–36.0)
MCV: 91.7 fL (ref 80.0–100.0)
MPV: 10.8 fL (ref 7.5–12.5)
Monocytes Relative: 9.3 %
Neutro Abs: 4236 cells/uL (ref 1500–7800)
Neutrophils Relative %: 71.8 %
Platelets: 187 10*3/uL (ref 140–400)
RBC: 5.39 10*6/uL (ref 4.20–5.80)
RDW: 12.6 % (ref 11.0–15.0)
Total Lymphocyte: 16.3 %
WBC mixed population: 549 cells/uL (ref 200–950)
WBC: 5.9 10*3/uL (ref 3.8–10.8)

## 2018-04-10 LAB — URINALYSIS
Bilirubin Urine: NEGATIVE
Glucose, UA: NEGATIVE
Hgb urine dipstick: NEGATIVE
Ketones, ur: NEGATIVE
Leukocytes, UA: NEGATIVE
Nitrite: NEGATIVE
Protein, ur: NEGATIVE
Specific Gravity, Urine: 1.017 (ref 1.001–1.03)
pH: 8 (ref 5.0–8.0)

## 2018-04-10 LAB — URINE CULTURE
MICRO NUMBER:: 91392654
Result:: NO GROWTH
SPECIMEN QUALITY:: ADEQUATE

## 2018-04-10 LAB — COMPREHENSIVE METABOLIC PANEL
AG Ratio: 1.9 (calc) (ref 1.0–2.5)
ALT: 5 U/L — ABNORMAL LOW (ref 9–46)
AST: 21 U/L (ref 10–35)
Albumin: 4.2 g/dL (ref 3.6–5.1)
Alkaline phosphatase (APISO): 63 U/L (ref 40–115)
BUN/Creatinine Ratio: 18 (calc) (ref 6–22)
BUN: 22 mg/dL (ref 7–25)
CO2: 30 mmol/L (ref 20–32)
Calcium: 9.7 mg/dL (ref 8.6–10.3)
Chloride: 101 mmol/L (ref 98–110)
Creat: 1.24 mg/dL — ABNORMAL HIGH (ref 0.70–1.18)
Globulin: 2.2 g/dL (calc) (ref 1.9–3.7)
Glucose, Bld: 100 mg/dL — ABNORMAL HIGH (ref 65–99)
Potassium: 3.7 mmol/L (ref 3.5–5.3)
Sodium: 142 mmol/L (ref 135–146)
Total Bilirubin: 0.8 mg/dL (ref 0.2–1.2)
Total Protein: 6.4 g/dL (ref 6.1–8.1)

## 2018-04-10 LAB — TSH: TSH: 1.14 mIU/L (ref 0.40–4.50)

## 2018-04-10 LAB — VITAMIN B12: Vitamin B-12: 806 pg/mL (ref 200–1100)

## 2018-04-10 NOTE — Telephone Encounter (Signed)
Let pt know that most labs ok.  Looks a little dehydrated (kidney function off a little).  Send labs to PCP.  In light of sx's, lets send to neurosx for opinion.  We decided last year to hold off on that given his spinal tap was equivocal but I think that we should get an opinion about it with Dr. Vertell Limber

## 2018-04-10 NOTE — Telephone Encounter (Signed)
Notes sent to Dr. Joylene Draft.

## 2018-04-10 NOTE — Telephone Encounter (Signed)
Spoke with patient and he was made aware. He is okay with referral to Neurosurgery.

## 2018-04-10 NOTE — Telephone Encounter (Signed)
Left message on machine for patient to call back.

## 2018-04-10 NOTE — Telephone Encounter (Signed)
Referral faxed to [facility] at [phone] with confirmation received. They will contact the patient to schedule.

## 2018-04-10 NOTE — Telephone Encounter (Signed)
Patient left a VM stating he was returning your call

## 2018-04-29 ENCOUNTER — Telehealth: Payer: Self-pay | Admitting: Neurology

## 2018-04-29 NOTE — Telephone Encounter (Signed)
Spoke with patient. Made him aware referral was sent and offered to give him number to contact them directly [phone]). He states he does have the number and will call them if needed.

## 2018-04-29 NOTE — Telephone Encounter (Signed)
Patient called regarding a referral that Dr. Carles Collet was sending to Dr. Sherley Bounds. He has not heard from that office to schedule an appointment. Thanks

## 2018-05-09 NOTE — Telephone Encounter (Signed)
Received note from [facility]. Patient scheduled to see Dr. Sherley Bounds (he has seen him in the past) on 05/20/18 at 8:30 am.

## 2018-05-20 DIAGNOSIS — G9389 Other specified disorders of brain: Secondary | ICD-10-CM | POA: Diagnosis not present

## 2018-05-21 ENCOUNTER — Other Ambulatory Visit (HOSPITAL_COMMUNITY): Payer: Self-pay | Admitting: *Deleted

## 2018-05-23 ENCOUNTER — Ambulatory Visit (HOSPITAL_COMMUNITY)
Admission: RE | Admit: 2018-05-23 | Discharge: 2018-05-23 | Disposition: A | Discharge status: Home / Self Care | Payer: Medicare Other | Source: Ambulatory Visit | Attending: Neurology | Admitting: Neurology

## 2018-05-23 DIAGNOSIS — G9389 Other specified disorders of brain: Secondary | ICD-10-CM | POA: Insufficient documentation

## 2018-05-23 DIAGNOSIS — G919 Hydrocephalus, unspecified: Secondary | ICD-10-CM | POA: Diagnosis not present

## 2018-05-23 DIAGNOSIS — R2681 Unsteadiness on feet: Secondary | ICD-10-CM | POA: Diagnosis not present

## 2018-05-23 LAB — CSF CELL COUNT WITH DIFFERENTIAL
RBC Count, CSF: 1 /mm3 — ABNORMAL HIGH
Tube #: 3
WBC, CSF: 0 /mm3 (ref 0–5)

## 2018-05-23 LAB — PROTEIN AND GLUCOSE, CSF
Glucose, CSF: 73 mg/dL — ABNORMAL HIGH (ref 40–70)
Total  Protein, CSF: 66 mg/dL — ABNORMAL HIGH (ref 15–45)

## 2018-05-23 NOTE — Progress Notes (Signed)
Patient was prepped and draped in sterile fashion. Injected 2cc of lidocaine. Inserted spinal needle into the L5-S1 interspace. CSF was drained through the spinal needle. Removed 32cc of spinal fluid. Starting pressure was measured at 11 and closing pressure was measured at 6. Patient tolerated well. Procedure performed with no complications. Patient was able to ambulate better after procedure and felt that his balance improved significantly. Family agreed that he was ambulating better.

## 2018-05-26 LAB — CSF CULTURE W GRAM STAIN: Culture: NO GROWTH

## 2018-06-11 ENCOUNTER — Other Ambulatory Visit: Payer: Self-pay | Admitting: Neurological Surgery

## 2018-06-11 DIAGNOSIS — G9389 Other specified disorders of brain: Secondary | ICD-10-CM | POA: Diagnosis not present

## 2018-06-12 ENCOUNTER — Other Ambulatory Visit: Payer: Self-pay

## 2018-06-12 ENCOUNTER — Encounter (HOSPITAL_COMMUNITY): Payer: Self-pay | Admitting: *Deleted

## 2018-06-12 NOTE — Progress Notes (Signed)
Pt denies SOB, chest pain, and being under the care of a cardiologist. Pt denies having a cardiac cath and echo. Pt denies having a diagnostic chest x ray within the last year. Pt denies recent labs. Pt made aware to stop taking vitamins, fish oil and herbal medications. Do not take any NSAIDs ie: Ibuprofen, Advil, Naproxen (Aleve), Motrin), BC and Goody Powder or any medication containing Aspirin. Pt stated that he does not check his blood glucose. Pt does not take medication for diaebtes. Pt verbalized understanding of all pre-op instructions.

## 2018-06-13 ENCOUNTER — Inpatient Hospital Stay (HOSPITAL_COMMUNITY): Payer: Medicare Other | Admitting: Certified Registered"

## 2018-06-13 ENCOUNTER — Encounter (HOSPITAL_COMMUNITY): Payer: Self-pay | Admitting: *Deleted

## 2018-06-13 ENCOUNTER — Encounter (HOSPITAL_COMMUNITY)
Admission: RE | Disposition: A | Discharge status: Home / Self Care | Payer: Self-pay | Source: Home / Self Care | Attending: Neurological Surgery

## 2018-06-13 ENCOUNTER — Inpatient Hospital Stay (HOSPITAL_COMMUNITY)
Admission: RE | Admit: 2018-06-13 | Discharge: 2018-06-14 | DRG: 032 | Disposition: A | Discharge status: Home / Self Care | Payer: Medicare Other | Attending: Neurological Surgery | Admitting: Neurological Surgery

## 2018-06-13 ENCOUNTER — Inpatient Hospital Stay (HOSPITAL_COMMUNITY): Payer: Medicare Other

## 2018-06-13 DIAGNOSIS — Z9989 Dependence on other enabling machines and devices: Secondary | ICD-10-CM

## 2018-06-13 DIAGNOSIS — Z8249 Family history of ischemic heart disease and other diseases of the circulatory system: Secondary | ICD-10-CM | POA: Diagnosis not present

## 2018-06-13 DIAGNOSIS — G912 (Idiopathic) normal pressure hydrocephalus: Principal | ICD-10-CM | POA: Diagnosis present

## 2018-06-13 DIAGNOSIS — Z818 Family history of other mental and behavioral disorders: Secondary | ICD-10-CM

## 2018-06-13 DIAGNOSIS — R2689 Other abnormalities of gait and mobility: Secondary | ICD-10-CM | POA: Diagnosis not present

## 2018-06-13 DIAGNOSIS — G4733 Obstructive sleep apnea (adult) (pediatric): Secondary | ICD-10-CM | POA: Diagnosis present

## 2018-06-13 DIAGNOSIS — Z923 Personal history of irradiation: Secondary | ICD-10-CM

## 2018-06-13 DIAGNOSIS — E785 Hyperlipidemia, unspecified: Secondary | ICD-10-CM | POA: Diagnosis present

## 2018-06-13 DIAGNOSIS — Z87891 Personal history of nicotine dependence: Secondary | ICD-10-CM | POA: Diagnosis not present

## 2018-06-13 DIAGNOSIS — Z801 Family history of malignant neoplasm of trachea, bronchus and lung: Secondary | ICD-10-CM | POA: Diagnosis not present

## 2018-06-13 DIAGNOSIS — H3410 Central retinal artery occlusion, unspecified eye: Secondary | ICD-10-CM | POA: Diagnosis present

## 2018-06-13 DIAGNOSIS — Z86010 Personal history of colonic polyps: Secondary | ICD-10-CM

## 2018-06-13 DIAGNOSIS — Z833 Family history of diabetes mellitus: Secondary | ICD-10-CM

## 2018-06-13 DIAGNOSIS — Z85828 Personal history of other malignant neoplasm of skin: Secondary | ICD-10-CM

## 2018-06-13 DIAGNOSIS — Z9089 Acquired absence of other organs: Secondary | ICD-10-CM | POA: Diagnosis not present

## 2018-06-13 DIAGNOSIS — Z8546 Personal history of malignant neoplasm of prostate: Secondary | ICD-10-CM | POA: Diagnosis not present

## 2018-06-13 DIAGNOSIS — I10 Essential (primary) hypertension: Secondary | ICD-10-CM | POA: Diagnosis present

## 2018-06-13 DIAGNOSIS — N400 Enlarged prostate without lower urinary tract symptoms: Secondary | ICD-10-CM | POA: Diagnosis not present

## 2018-06-13 DIAGNOSIS — G9389 Other specified disorders of brain: Secondary | ICD-10-CM | POA: Diagnosis not present

## 2018-06-13 DIAGNOSIS — G919 Hydrocephalus, unspecified: Secondary | ICD-10-CM

## 2018-06-13 DIAGNOSIS — E119 Type 2 diabetes mellitus without complications: Secondary | ICD-10-CM | POA: Diagnosis present

## 2018-06-13 DIAGNOSIS — Z79899 Other long term (current) drug therapy: Secondary | ICD-10-CM

## 2018-06-13 DIAGNOSIS — Z01811 Encounter for preprocedural respiratory examination: Secondary | ICD-10-CM | POA: Diagnosis not present

## 2018-06-13 DIAGNOSIS — Z87442 Personal history of urinary calculi: Secondary | ICD-10-CM | POA: Diagnosis not present

## 2018-06-13 DIAGNOSIS — Z8042 Family history of malignant neoplasm of prostate: Secondary | ICD-10-CM | POA: Diagnosis not present

## 2018-06-13 DIAGNOSIS — Z982 Presence of cerebrospinal fluid drainage device: Secondary | ICD-10-CM

## 2018-06-13 DIAGNOSIS — J986 Disorders of diaphragm: Secondary | ICD-10-CM | POA: Diagnosis not present

## 2018-06-13 HISTORY — PX: VENTRICULOPERITONEAL SHUNT: SHX204

## 2018-06-13 HISTORY — DX: Personal history of urinary calculi: Z87.442

## 2018-06-13 HISTORY — DX: Presence of spectacles and contact lenses: Z97.3

## 2018-06-13 HISTORY — DX: Other seasonal allergic rhinitis: J30.2

## 2018-06-13 LAB — CBC WITH DIFFERENTIAL/PLATELET
Abs Immature Granulocytes: 0.07 10*3/uL (ref 0.00–0.07)
Basophils Absolute: 0.1 10*3/uL (ref 0.0–0.1)
Basophils Relative: 1 %
Eosinophils Absolute: 0.1 10*3/uL (ref 0.0–0.5)
Eosinophils Relative: 2 %
HCT: 53.5 % — ABNORMAL HIGH (ref 39.0–52.0)
Hemoglobin: 17.2 g/dL — ABNORMAL HIGH (ref 13.0–17.0)
Immature Granulocytes: 1 %
Lymphocytes Relative: 20 %
Lymphs Abs: 1.3 10*3/uL (ref 0.7–4.0)
MCH: 31.1 pg (ref 26.0–34.0)
MCHC: 32.1 g/dL (ref 30.0–36.0)
MCV: 96.7 fL (ref 80.0–100.0)
Monocytes Absolute: 0.6 10*3/uL (ref 0.1–1.0)
Monocytes Relative: 10 %
Neutro Abs: 4.5 10*3/uL (ref 1.7–7.7)
Neutrophils Relative %: 66 %
Platelets: 168 10*3/uL (ref 150–400)
RBC: 5.53 MIL/uL (ref 4.22–5.81)
RDW: 12.9 % (ref 11.5–15.5)
WBC: 6.7 10*3/uL (ref 4.0–10.5)
nRBC: 0 % (ref 0.0–0.2)

## 2018-06-13 LAB — BASIC METABOLIC PANEL
Anion gap: 11 (ref 5–15)
BUN: 20 mg/dL (ref 8–23)
CO2: 24 mmol/L (ref 22–32)
Calcium: 8.9 mg/dL (ref 8.9–10.3)
Chloride: 107 mmol/L (ref 98–111)
Creatinine, Ser: 1.19 mg/dL (ref 0.61–1.24)
GFR calc Af Amer: >60 mL/min (ref >60)
GFR calc non Af Amer: 58 mL/min — ABNORMAL LOW (ref >60)
Glucose, Bld: 114 mg/dL — ABNORMAL HIGH (ref 70–99)
Potassium: 3.6 mmol/L (ref 3.5–5.1)
Sodium: 142 mmol/L (ref 135–145)

## 2018-06-13 LAB — GLUCOSE, CAPILLARY
Glucose-Capillary: 111 mg/dL — ABNORMAL HIGH (ref 70–99)
Glucose-Capillary: 122 mg/dL — ABNORMAL HIGH (ref 70–99)
Glucose-Capillary: 150 mg/dL — ABNORMAL HIGH (ref 70–99)

## 2018-06-13 LAB — PROTIME-INR
INR: 1.01
Prothrombin Time: 13.2 seconds (ref 11.4–15.2)

## 2018-06-13 SURGERY — SHUNT INSERTION VENTRICULAR-PERITONEAL
Anesthesia: General | Site: Head

## 2018-06-13 MED ORDER — ONDANSETRON HCL 4 MG PO TABS: 4.0000 mg | ORAL_TABLET | Freq: Four times a day (QID) | ORAL | Status: DC | PRN

## 2018-06-13 MED ORDER — FENTANYL CITRATE (PF) 250 MCG/5ML IJ SOLN
INTRAMUSCULAR | Status: AC
  Filled 2018-06-13: qty 5

## 2018-06-13 MED ORDER — ACETAMINOPHEN 325 MG PO TABS: 650.0000 mg | ORAL_TABLET | ORAL | Status: DC | PRN

## 2018-06-13 MED ORDER — PROPOFOL 10 MG/ML IV BOLUS
INTRAVENOUS | Status: AC
  Filled 2018-06-13: qty 20

## 2018-06-13 MED ORDER — HYDROCHLOROTHIAZIDE 12.5 MG PO CAPS
12.5000 mg | ORAL_CAPSULE | Freq: Every day | ORAL | Status: DC
  Administered 2018-06-13 – 2018-06-14 (×2): 12.5 mg via ORAL
  Filled 2018-06-13 (×2): qty 1

## 2018-06-13 MED ORDER — ONDANSETRON HCL 4 MG/2ML IJ SOLN
INTRAMUSCULAR | Status: AC
  Filled 2018-06-13: qty 2

## 2018-06-13 MED ORDER — SODIUM CHLORIDE 0.9% FLUSH
3.0000 mL | Freq: Two times a day (BID) | INTRAVENOUS | Status: DC
  Administered 2018-06-13: 3 mL via INTRAVENOUS

## 2018-06-13 MED ORDER — PHENYLEPHRINE 40 MCG/ML (10ML) SYRINGE FOR IV PUSH (FOR BLOOD PRESSURE SUPPORT)
PREFILLED_SYRINGE | INTRAVENOUS | Status: DC | PRN
  Administered 2018-06-13: 120 ug via INTRAVENOUS
  Administered 2018-06-13 (×2): 80 ug via INTRAVENOUS

## 2018-06-13 MED ORDER — SUGAMMADEX SODIUM 200 MG/2ML IV SOLN
INTRAVENOUS | Status: DC | PRN
  Administered 2018-06-13 (×2): 100 mg via INTRAVENOUS

## 2018-06-13 MED ORDER — FENTANYL CITRATE (PF) 100 MCG/2ML IJ SOLN
25.0000 ug | INTRAMUSCULAR | Status: DC | PRN
  Administered 2018-06-13 (×2): 25 ug via INTRAVENOUS

## 2018-06-13 MED ORDER — PROPOFOL 10 MG/ML IV BOLUS
INTRAVENOUS | Status: DC | PRN
  Administered 2018-06-13: 150 mg via INTRAVENOUS

## 2018-06-13 MED ORDER — BACITRACIN ZINC 500 UNIT/GM EX OINT
TOPICAL_OINTMENT | CUTANEOUS | Status: DC | PRN
  Administered 2018-06-13: 1 via TOPICAL

## 2018-06-13 MED ORDER — CEFAZOLIN SODIUM-DEXTROSE 2-4 GM/100ML-% IV SOLN
INTRAVENOUS | Status: AC
  Filled 2018-06-13: qty 100

## 2018-06-13 MED ORDER — LIDOCAINE 2% (20 MG/ML) 5 ML SYRINGE
INTRAMUSCULAR | Status: AC
  Filled 2018-06-13: qty 5

## 2018-06-13 MED ORDER — ROCURONIUM BROMIDE 10 MG/ML (PF) SYRINGE
PREFILLED_SYRINGE | INTRAVENOUS | Status: DC | PRN
  Administered 2018-06-13: 50 mg via INTRAVENOUS

## 2018-06-13 MED ORDER — HYDROCODONE-ACETAMINOPHEN 5-325 MG PO TABS
ORAL_TABLET | ORAL | Status: AC
  Administered 2018-06-13: 22:00:00
  Filled 2018-06-13: qty 1

## 2018-06-13 MED ORDER — ONDANSETRON HCL 4 MG/2ML IJ SOLN
INTRAMUSCULAR | Status: DC | PRN
  Administered 2018-06-13: 4 mg via INTRAVENOUS

## 2018-06-13 MED ORDER — ACETAMINOPHEN 650 MG RE SUPP: 650.0000 mg | RECTAL | Status: DC | PRN

## 2018-06-13 MED ORDER — SODIUM CHLORIDE 0.9 % IV SOLN
INTRAVENOUS | Status: DC | PRN
  Administered 2018-06-13: 15:00:00

## 2018-06-13 MED ORDER — CHLORHEXIDINE GLUCONATE CLOTH 2 % EX PADS: 6.0000 | MEDICATED_PAD | Freq: Once | CUTANEOUS | Status: DC

## 2018-06-13 MED ORDER — THROMBIN 5000 UNITS EX SOLR
CUTANEOUS | Status: AC
  Filled 2018-06-13: qty 15000

## 2018-06-13 MED ORDER — BACITRACIN ZINC 500 UNIT/GM EX OINT
TOPICAL_OINTMENT | CUTANEOUS | Status: AC
  Filled 2018-06-13: qty 28.35

## 2018-06-13 MED ORDER — PHENOL 1.4 % MT LIQD: 1.0000 | OROMUCOSAL | Status: DC | PRN

## 2018-06-13 MED ORDER — FENTANYL CITRATE (PF) 100 MCG/2ML IJ SOLN
INTRAMUSCULAR | Status: DC | PRN
  Administered 2018-06-13: 100 ug via INTRAVENOUS
  Administered 2018-06-13 (×2): 50 ug via INTRAVENOUS

## 2018-06-13 MED ORDER — PROMETHAZINE HCL 25 MG/ML IJ SOLN: 6.2500 mg | INTRAMUSCULAR | Status: DC | PRN

## 2018-06-13 MED ORDER — POTASSIUM CHLORIDE IN NACL 20-0.9 MEQ/L-% IV SOLN
INTRAVENOUS | Status: DC
  Administered 2018-06-13: 04:00:00 via INTRAVENOUS
  Filled 2018-06-13: qty 1000

## 2018-06-13 MED ORDER — ROCURONIUM BROMIDE 50 MG/5ML IV SOSY
PREFILLED_SYRINGE | INTRAVENOUS | Status: AC
  Filled 2018-06-13: qty 5

## 2018-06-13 MED ORDER — DEXAMETHASONE SODIUM PHOSPHATE 10 MG/ML IJ SOLN
INTRAMUSCULAR | Status: AC
  Filled 2018-06-13: qty 1

## 2018-06-13 MED ORDER — SENNA 8.6 MG PO TABS
1.0000 | ORAL_TABLET | Freq: Two times a day (BID) | ORAL | Status: DC
  Administered 2018-06-13 – 2018-06-14 (×2): 8.6 mg via ORAL
  Filled 2018-06-13 (×2): qty 1

## 2018-06-13 MED ORDER — LIDOCAINE-EPINEPHRINE 1 %-1:100000 IJ SOLN
INTRAMUSCULAR | Status: DC | PRN
  Administered 2018-06-13: 10 mL

## 2018-06-13 MED ORDER — MORPHINE SULFATE (PF) 2 MG/ML IV SOLN: 2.0000 mg | INTRAVENOUS | Status: DC | PRN

## 2018-06-13 MED ORDER — MIDAZOLAM HCL 2 MG/2ML IJ SOLN
INTRAMUSCULAR | Status: AC
  Filled 2018-06-13: qty 2

## 2018-06-13 MED ORDER — LACTATED RINGERS IV SOLN
INTRAVENOUS | Status: DC
  Administered 2018-06-13 (×2): via INTRAVENOUS

## 2018-06-13 MED ORDER — SUCCINYLCHOLINE CHLORIDE 200 MG/10ML IV SOSY
PREFILLED_SYRINGE | INTRAVENOUS | Status: DC | PRN
  Administered 2018-06-13: 120 mg via INTRAVENOUS

## 2018-06-13 MED ORDER — CEFAZOLIN SODIUM-DEXTROSE 2-4 GM/100ML-% IV SOLN
2.0000 g | INTRAVENOUS | Status: AC
  Administered 2018-06-13: 2 g via INTRAVENOUS

## 2018-06-13 MED ORDER — LIDOCAINE-EPINEPHRINE 1 %-1:100000 IJ SOLN
INTRAMUSCULAR | Status: AC
  Filled 2018-06-13: qty 1

## 2018-06-13 MED ORDER — FENTANYL CITRATE (PF) 100 MCG/2ML IJ SOLN
INTRAMUSCULAR | Status: AC
  Filled 2018-06-13: qty 2

## 2018-06-13 MED ORDER — LIDOCAINE 2% (20 MG/ML) 5 ML SYRINGE
INTRAMUSCULAR | Status: DC | PRN
  Administered 2018-06-13: 80 mg via INTRAVENOUS

## 2018-06-13 MED ORDER — HYDROCODONE-ACETAMINOPHEN 5-325 MG PO TABS
1.0000 | ORAL_TABLET | ORAL | Status: DC | PRN
  Administered 2018-06-13 – 2018-06-14 (×2): 1 via ORAL
  Filled 2018-06-13: qty 1

## 2018-06-13 MED ORDER — ONDANSETRON HCL 4 MG/2ML IJ SOLN: 4.0000 mg | Freq: Four times a day (QID) | INTRAMUSCULAR | Status: DC | PRN

## 2018-06-13 MED ORDER — THROMBIN 5000 UNITS EX SOLR
OROMUCOSAL | Status: DC | PRN
  Administered 2018-06-13: 15:00:00

## 2018-06-13 MED ORDER — SUCCINYLCHOLINE CHLORIDE 200 MG/10ML IV SOSY
PREFILLED_SYRINGE | INTRAVENOUS | Status: AC
  Filled 2018-06-13: qty 10

## 2018-06-13 MED ORDER — ROCURONIUM BROMIDE 50 MG/5ML IV SOSY
PREFILLED_SYRINGE | INTRAVENOUS | Status: AC
  Filled 2018-06-13: qty 10

## 2018-06-13 MED ORDER — CEFAZOLIN SODIUM-DEXTROSE 1-4 GM/50ML-% IV SOLN
1.0000 g | Freq: Three times a day (TID) | INTRAVENOUS | Status: AC
  Administered 2018-06-13 – 2018-06-14 (×2): 1 g via INTRAVENOUS
  Filled 2018-06-13 (×2): qty 50

## 2018-06-13 MED ORDER — SODIUM CHLORIDE 0.9% FLUSH: 3.0000 mL | INTRAVENOUS | Status: DC | PRN

## 2018-06-13 MED ORDER — POTASSIUM CHLORIDE CRYS ER 10 MEQ PO TBCR
20.0000 meq | EXTENDED_RELEASE_TABLET | Freq: Every day | ORAL | Status: DC
  Administered 2018-06-13 – 2018-06-14 (×2): 20 meq via ORAL
  Filled 2018-06-13 (×3): qty 2

## 2018-06-13 MED ORDER — SODIUM CHLORIDE 0.9 % IV SOLN
INTRAVENOUS | Status: DC | PRN
  Administered 2018-06-13: 15 ug/min via INTRAVENOUS

## 2018-06-13 MED ORDER — DEXAMETHASONE SODIUM PHOSPHATE 10 MG/ML IJ SOLN
10.0000 mg | INTRAMUSCULAR | Status: AC
  Administered 2018-06-13: 10 mg via INTRAVENOUS

## 2018-06-13 MED ORDER — ACETAMINOPHEN 500 MG PO TABS
1000.0000 mg | ORAL_TABLET | Freq: Once | ORAL | Status: AC
  Administered 2018-06-13: 1000 mg via ORAL
  Filled 2018-06-13: qty 2

## 2018-06-13 MED ORDER — MENTHOL 3 MG MT LOZG: 1.0000 | LOZENGE | OROMUCOSAL | Status: DC | PRN

## 2018-06-13 MED ORDER — SODIUM CHLORIDE 0.9 % IV SOLN: 250.0000 mL | INTRAVENOUS | Status: DC

## 2018-06-13 SURGICAL SUPPLY — 77 items
ADH SKN CLS APL DERMABOND .7 (GAUZE/BANDAGES/DRESSINGS) ×1
APL SKNCLS STERI-STRIP NONHPOA (GAUZE/BANDAGES/DRESSINGS) ×3
BAG DECANTER FOR FLEXI CONT (MISCELLANEOUS) ×2 IMPLANT
BENZOIN TINCTURE PRP APPL 2/3 (GAUZE/BANDAGES/DRESSINGS) ×5 IMPLANT
BLADE CLIPPER SPEC (BLADE) ×2 IMPLANT
BUR ACORN 6.0 PRECISION (BURR) ×2 IMPLANT
CANISTER SUCT 3000ML PPV (MISCELLANEOUS) ×2 IMPLANT
CARTRIDGE OIL MAESTRO DRILL (MISCELLANEOUS) ×1 IMPLANT
CATH ROBINSON RED A/P 18FR (CATHETERS) ×1 IMPLANT
CLIP RANEY DISP (INSTRUMENTS) IMPLANT
COVER WAND RF STERILE (DRAPES) ×1 IMPLANT
DERMABOND ADVANCED (GAUZE/BANDAGES/DRESSINGS) ×1
DERMABOND ADVANCED .7 DNX12 (GAUZE/BANDAGES/DRESSINGS) IMPLANT
DIFFUSER DRILL AIR PNEUMATIC (MISCELLANEOUS) ×2 IMPLANT
DRAPE INCISE IOBAN 66X45 STRL (DRAPES) ×2 IMPLANT
DRAPE ORTHO SPLIT 77X108 STRL (DRAPES) ×4
DRAPE POUCH INSTRU U-SHP 10X18 (DRAPES) ×2 IMPLANT
DRAPE SURG 17X23 STRL (DRAPES) IMPLANT
DRAPE SURG ORHT 6 SPLT 77X108 (DRAPES) ×2 IMPLANT
DRSG OPSITE POSTOP 3X4 (GAUZE/BANDAGES/DRESSINGS) ×1 IMPLANT
ELECT REM PT RETURN 9FT ADLT (ELECTROSURGICAL) ×2
ELECTRODE REM PT RTRN 9FT ADLT (ELECTROSURGICAL) ×1 IMPLANT
GAUZE 4X4 16PLY RFD (DISPOSABLE) ×2 IMPLANT
GAUZE SPONGE 4X4 12PLY STRL (GAUZE/BANDAGES/DRESSINGS) ×1 IMPLANT
GLOVE BIO SURGEON STRL SZ7 (GLOVE) IMPLANT
GLOVE BIO SURGEON STRL SZ8 (GLOVE) ×2 IMPLANT
GLOVE BIOGEL PI IND STRL 6.5 (GLOVE) IMPLANT
GLOVE BIOGEL PI IND STRL 7.0 (GLOVE) IMPLANT
GLOVE BIOGEL PI IND STRL 7.5 (GLOVE) IMPLANT
GLOVE BIOGEL PI IND STRL 8 (GLOVE) IMPLANT
GLOVE BIOGEL PI INDICATOR 6.5 (GLOVE) ×4
GLOVE BIOGEL PI INDICATOR 7.0 (GLOVE)
GLOVE BIOGEL PI INDICATOR 7.5 (GLOVE) ×1
GLOVE BIOGEL PI INDICATOR 8 (GLOVE) ×1
GLOVE ECLIPSE 7.5 STRL STRAW (GLOVE) ×2 IMPLANT
GLOVE SS N UNI LF 6.5 STRL (GLOVE) ×4 IMPLANT
GOWN STRL REUS W/ TWL LRG LVL3 (GOWN DISPOSABLE) IMPLANT
GOWN STRL REUS W/ TWL XL LVL3 (GOWN DISPOSABLE) IMPLANT
GOWN STRL REUS W/TWL 2XL LVL3 (GOWN DISPOSABLE) ×2 IMPLANT
GOWN STRL REUS W/TWL LRG LVL3 (GOWN DISPOSABLE) ×4
GOWN STRL REUS W/TWL XL LVL3 (GOWN DISPOSABLE) ×2
KIT BASIN OR (CUSTOM PROCEDURE TRAY) ×2 IMPLANT
KIT TURNOVER KIT B (KITS) ×2 IMPLANT
NDL HYPO 25X1 1.5 SAFETY (NEEDLE) ×1 IMPLANT
NEEDLE HYPO 25X1 1.5 SAFETY (NEEDLE) ×4 IMPLANT
NS IRRIG 1000ML POUR BTL (IV SOLUTION) ×2 IMPLANT
OIL CARTRIDGE MAESTRO DRILL (MISCELLANEOUS) ×2
PACK LAMINECTOMY NEURO (CUSTOM PROCEDURE TRAY) ×2 IMPLANT
PAD ARMBOARD 7.5X6 YLW CONV (MISCELLANEOUS) ×6 IMPLANT
PATTIES SURGICAL .5 X.5 (GAUZE/BANDAGES/DRESSINGS) IMPLANT
PATTIES SURGICAL .5 X3 (DISPOSABLE) IMPLANT
PATTIES SURGICAL .75X.75 (GAUZE/BANDAGES/DRESSINGS) IMPLANT
RUBBERBAND STERILE (MISCELLANEOUS) IMPLANT
SHEATH PERITONEAL INTRO 46 (MISCELLANEOUS) IMPLANT
SHEATH PERITONEAL INTRO 61 (MISCELLANEOUS) IMPLANT
SPONGE LAP 4X18 RFD (DISPOSABLE) IMPLANT
SPONGE SURGIFOAM ABS GEL SZ50 (HEMOSTASIS) ×2 IMPLANT
STAPLER VISISTAT 35W (STAPLE) ×2 IMPLANT
STRIP CLOSURE SKIN 1/2X4 (GAUZE/BANDAGES/DRESSINGS) ×2 IMPLANT
SUT ETHILON 3 0 FSL (SUTURE) IMPLANT
SUT SILK 0 TIES 10X30 (SUTURE) IMPLANT
SUT SILK 2 0 PERMA HAND 18 BK (SUTURE) ×1 IMPLANT
SUT SILK 2 0 TIES 17X18 (SUTURE) ×2
SUT SILK 2-0 18XBRD TIE BLK (SUTURE) ×1 IMPLANT
SUT SILK 2X60 BLK UNID (SUTURE) ×1 IMPLANT
SUT SILK 3 0 SH 30 (SUTURE) IMPLANT
SUT VIC AB 2-0 CP2 18 (SUTURE) ×5 IMPLANT
SUT VIC AB 3-0 SH 8-18 (SUTURE) ×2 IMPLANT
SYR 5ML LL (SYRINGE) IMPLANT
SYR CONTROL 10ML LL (SYRINGE) ×4 IMPLANT
TAPE CLOTH SURG 4X10 WHT LF (GAUZE/BANDAGES/DRESSINGS) ×1 IMPLANT
TOWEL GREEN STERILE (TOWEL DISPOSABLE) ×2 IMPLANT
TOWEL GREEN STERILE FF (TOWEL DISPOSABLE) ×2 IMPLANT
TRAY FOLEY MTR SLVR 16FR STAT (SET/KITS/TRAYS/PACK) IMPLANT
UNDERPAD 30X30 (UNDERPADS AND DIAPERS) ×2 IMPLANT
VALVE RT ANGLE UNITIZE DIST (Valve) ×1 IMPLANT
WATER STERILE IRR 1000ML POUR (IV SOLUTION) ×2 IMPLANT

## 2018-06-13 NOTE — Progress Notes (Signed)
Pharmacy tech paged to complete medrec

## 2018-06-13 NOTE — Op Note (Signed)
06/13/2018  3:42 PM  PATIENT:  Jeremiah [name]  78 y.o. 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 Imaging showed ventriculomegaly. The patient tried conservative measures without relief.  Responded very nicely to diagnostic lumbar puncture. Recommended ventriculo peritoneal shunting. Patient understood the risks, benefits, and alternatives and potential outcomes and wished to proceed.  PROCEDURE DETAILS: The patient was taken to the operating room and after induction of adequate generalized endotracheal anesthesia he was placed in the supine position on the operative table.  His right posterior parietal region and right abdomen and chest were cleaned with Betadine scrub and then prepped with DuraPrep and then draped in usual sterile fashion.  Local anesthesia was injected at both incision sites and a hockey-stick incision was made in the right posterior parietal region and a linear incision was made in the right subcostal region.  In the subcostal region we dissected down and identified the anterior rectus sheath, the rectus muscle the posterior rectus sheath which were open to identify the underlying peritoneum.  The peritoneum was opened and a red rubber catheter was passed easily.  We saw small bowel.  Placed a pursestring.  We passed a shunt passer from our subcostal incision to our posterior parietal incision.  The programmable valve was set to 140 mm of water and the distal catheter was passed through the shunt passer from the cranial  incision to the abdominal incision.  We then made a bur hole in the right posterior parietal region and open the dura.  We then passed our proximal catheter through the right posterior parietal lobe into the right ventricle with good flow of CSF.  We cut this at 9 cm and tied it to our [place] reservoir.  We then had good flow of CSF through the distal catheter.  The distal catheter was placed in the peritoneum and the pursestring was sewed closed.  We then closed the rectus fascia with 2-0 Vicryl.  We closed the subcutaneous tissues with 2-0 Vicryl.  Closed the subcuticular tissue with 3-0 Vicryl and the skin with Dermabond.  The cranial incision was closed with 2-0 Vicryl in the galea.  The skin was closed with staples.  Sterile dressings were applied.  The patient was awakened from general anesthesia and transferred to the recovery room in stable condition.  At the end of the procedure all sponge needle instrument counts were correct.   PLAN OF CARE: Admit to inpatient   PATIENT DISPOSITION:  PACU - hemodynamically stable.   Delay start of Pharmacological VTE agent (>24hrs) due to surgical blood loss or risk of bleeding:  yes

## 2018-06-13 NOTE — H&P (Signed)
Subjective: Patient is a 78 y.o. 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 Onset of symptoms was several months ago, gradually worsening since that time.  The pain is rated none.  Lumbar puncture led to significant relief of symptoms and improvement in his gait.. Symptoms are exacerbated by nothing in particular. MRI or CT showed ventriculomegaly  Past Medical History:  Diagnosis Date  . BPH (benign prostatic hyperplasia)   . Central retinal artery occlusion   . Diabetes mellitus without complication (HCC)    diet controlled  . Diverticulosis   . History of kidney stones   . Hyperlipidemia   . Hypertension   . OSA on CPAP    wears cpap  . Osteoarthritis   . 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 S/P radiation therapy 04/23/13 - 05/29/14   Prostate/seminal vesicles, external beam 4500 cGy in 25 sessions  . Seasonal allergies   . Skin cancer    scalp  . Wears glasses     Past Surgical History:  Procedure Laterality Date  . BACK SURGERY  2009  . COLONOSCOPY W/ BIOPSIES AND POLYPECTOMY    . Bent  . PROSTATE BIOPSY  2013, 2014, 01/2014   Gleason 7  . RADIOACTIVE SEED IMPLANT N/A 07/01/2014   Procedure: RADIOACTIVE SEED IMPLANT;  Surgeon: [name], MD;  Location: [facility];  Service: Urology;  Laterality: N/A;  DR PORTABLE  . TONSILLECTOMY      Prior to Admission medications   Medication Sig Start Date End Date Taking? Authorizing Provider  hydrochlorothiazide (MICROZIDE) 12.5 MG capsule Take 12.5 mg by mouth daily.  07/14/15  Yes [provider]  Multiple Vitamins-Minerals (PRESERVISION AREDS 2 PO) Take 1 tablet by mouth daily.    Yes [provider]  potassium chloride (K-DUR) 10 MEQ tablet Take 20 mEq by mouth daily.  06/16/15  Yes [provider]  vitamin B-12 (CYANOCOBALAMIN) 1000 MCG tablet Take 1,000 mcg by mouth daily.   Yes [provider]   No  Known Allergies  Social History   Tobacco Use  . Smoking status: Former Smoker    Types: Cigarettes    Last attempt to quit: 03/10/1961    Years since quitting: 57.2  . Smokeless tobacco: Never Used  Substance Use Topics  . Alcohol use: Yes    Alcohol/week: 1.0 standard drinks    Types: 1 Glasses of wine per week    Comment: rare alcohol    Family History  Problem Relation Age of Onset  . Heart attack Father 29  . Cancer Father        prostate  . Diabetes Father   . Cancer Paternal Uncle        prostate  . Dementia Mother 44  . Lung cancer Brother      Review of Systems  Positive ROS: neg  All other systems have been reviewed and were otherwise negative with the exception of those mentioned in the HPI and as above.  Objective: Vital signs in last 24 hours: Temp:  [98.4 F (36.9 C)] 98.4 F (36.9 C) (01/23 1200) Pulse Rate:  [87] 87 (01/23 1200) Resp:  [18] 18 (01/23 1200) BP: (145)/(93) 145/93 (01/23 1200) SpO2:  [95 %] 95 % (01/23 1200) Weight:  [83.9 kg] 83.9 kg (01/23 1200)  General Appearance: Alert, cooperative, no distress, appears stated age Head: Normocephalic, without obvious abnormality, atraumatic Eyes: PERRL, conjunctiva/corneas clear, EOM's intact  Neck: Supple, symmetrical, trachea midline Back: Symmetric, no curvature, ROM normal, no CVA tenderness Lungs:  respirations unlabored Heart: Regular rate and rhythm Abdomen: Soft, non-tender Extremities: Extremities normal, atraumatic, no cyanosis or edema Pulses: 2+ and symmetric all extremities Skin: Skin color, texture, turgor normal, no rashes or lesions  NEUROLOGIC:   Mental status: Alert and oriented x4,  no aphasia, good attention span, fund of knowledge, and memory Motor Exam - grossly normal Sensory Exam - grossly normal Reflexes: 1+ Coordination - grossly normal Gait -antalgic Balance -decreased Cranial Nerves: I: smell Not tested  II: visual acuity  OS: nl    OD: nl  II: visual  fields Full to confrontation  II: pupils Equal, round, reactive to light  III,VII: ptosis None  III,IV,VI: extraocular muscles  Full ROM  V: mastication Normal  V: facial light touch sensation  Normal  V,VII: corneal reflex  Present  VII: facial muscle function - upper  Normal  VII: facial muscle function - lower Normal  VIII: hearing Not tested  IX: soft palate elevation  Normal  IX,X: gag reflex Present  XI: trapezius strength  5/5  XI: sternocleidomastoid strength 5/5  XI: neck flexion strength  5/5  XII: tongue strength  Normal    Data Review Lab Results  Component Value Date   WBC 6.7 06/13/2018   HGB 17.2 (H) 06/13/2018   HCT 53.5 (H) 06/13/2018   MCV 96.7 06/13/2018   PLT 168 06/13/2018   Lab Results  Component Value Date   NA 142 06/13/2018   K 3.6 06/13/2018   CL 107 06/13/2018   CO2 24 06/13/2018   BUN 20 06/13/2018   CREATININE 1.19 06/13/2018   GLUCOSE 114 (H) 06/13/2018   Lab Results  Component Value Date   INR 1.01 06/13/2018    Assessment/Plan:  Estimated body mass index is 27.32 kg/m as calculated from the following:   Height as of this encounter: 5\' 9"  (1.753 m).   Weight as of this encounter: 83.9 kg. Patient admitted for VPS for NPH. Patient has failed a reasonable attempt at conservative therapy.  I explained the condition and procedure to the patient and answered any questions.  Patient wishes to proceed with procedure as planned. Understands risks/ benefits and typical outcomes of procedure.   Jeremiah [name] 06/13/2018 2:02 PM

## 2018-06-13 NOTE — Anesthesia Preprocedure Evaluation (Signed)
Anesthesia Evaluation  Patient identified by MRN, date of birth, ID band Patient awake    Reviewed: Allergy & Precautions, NPO status , Patient's Chart, lab work & pertinent test results  History of Anesthesia Complications Negative for: history of anesthetic complications  Airway Mallampati: II  TM Distance: >3 FB Neck ROM: Full    Dental no notable dental hx. (+) Dental Advisory Given   Pulmonary sleep apnea and Continuous Positive Airway Pressure Ventilation , former smoker,  CXR reviewed.   Pulmonary exam normal breath sounds clear to auscultation       Cardiovascular Exercise Tolerance: Good hypertension, Pt. on medications Normal cardiovascular exam Rhythm:Regular Rate:Normal  ECG: cannot rule out anterior infarct, no significant change.  Denies cardiopulmonary symptoms.   Neuro/Psych negative neurological ROS  negative psych ROS   GI/Hepatic negative GI ROS, Neg liver ROS,   Endo/Other  diabetes, Type 2Diet control  Renal/GU negative Renal ROS  negative genitourinary   Musculoskeletal  (+) Arthritis ,   Abdominal   Peds negative pediatric ROS (+)  Hematology negative hematology ROS (+)   Anesthesia Other Findings   Reproductive/Obstetrics negative OB ROS                             Anesthesia Physical  Anesthesia Plan  ASA: III  Anesthesia Plan: General   Post-op Pain Management:    Induction: Intravenous  PONV Risk Score and Plan: 3 and Ondansetron and Dexamethasone  Airway Management Planned: Oral ETT  Additional Equipment:   Intra-op Plan:   Post-operative Plan: Extubation in OR  Informed Consent: I have reviewed the patients History and Physical, chart, labs and discussed the procedure including the risks, benefits and alternatives for the proposed anesthesia with the patient or authorized representative who has indicated his/her understanding and  acceptance.     Dental advisory given  Plan Discussed with: CRNA and Anesthesiologist  Anesthesia Plan Comments:         Anesthesia Quick Evaluation

## 2018-06-13 NOTE — Anesthesia Postprocedure Evaluation (Signed)
Anesthesia Post Note  Patient: Jeremiah [name]  Procedure(s) Performed: SHUNT INSERTION VENTRICULAR-PERITONEAL (N/A Head)     Patient location during evaluation: PACU Anesthesia Type: General Level of consciousness: sedated Pain management: pain level controlled Vital Signs Assessment: post-procedure vital signs reviewed and stable Respiratory status: spontaneous breathing and respiratory function stable Cardiovascular status: stable Postop Assessment: no apparent nausea or vomiting Anesthetic complications: no    Last Vitals:  Vitals:   06/13/18 1600 06/13/18 1630  BP: 124/77 130/82  Pulse: 79 80  Resp: 13 12  Temp: (!) 36.2 C   SpO2: 100% 97%    Last Pain:  Vitals:   06/13/18 1630  TempSrc:   PainSc: 4                  Quantarius Genrich DANIEL

## 2018-06-13 NOTE — Anesthesia Procedure Notes (Signed)
Procedure Name: Intubation Date/Time: 06/13/2018 2:26 PM Performed by: Trinna Post., CRNA Pre-anesthesia Checklist: Patient identified, Emergency Drugs available, Suction available, Patient being monitored and Timeout performed Patient Re-evaluated:Patient Re-evaluated prior to induction Oxygen Delivery Method: Circle system utilized Preoxygenation: Pre-oxygenation with 100% oxygen Induction Type: IV induction Ventilation: Mask ventilation without difficulty Laryngoscope Size: Mac and 4 Grade View: Grade II Tube type: Oral Tube size: 7.5 mm Number of attempts: 1 Airway Equipment and Method: Stylet Placement Confirmation: ETT inserted through vocal cords under direct vision,  positive ETCO2 and breath sounds checked- equal and bilateral Secured at: 21 cm Tube secured with: Tape Dental Injury: Teeth and Oropharynx as per pre-operative assessment

## 2018-06-13 NOTE — Transfer of Care (Signed)
Immediate Anesthesia Transfer of Care Note  Patient: Jeremiah [name]  Procedure(s) Performed: SHUNT INSERTION VENTRICULAR-PERITONEAL (N/A Head)  Patient Location: PACU  Anesthesia Type:General  Level of Consciousness: awake, alert  and oriented  Airway & Oxygen Therapy: Patient Spontanous Breathing and Patient connected to face mask oxygen  Post-op Assessment: Report given to RN and Post -op Vital signs reviewed and stable  Post vital signs: Reviewed and stable  Last Vitals:  Vitals Value Taken Time  BP 124/77 06/13/2018  3:59 PM  Temp    Pulse 79 06/13/2018  3:59 PM  Resp 13 06/13/2018  3:59 PM  SpO2 100 % 06/13/2018  3:59 PM  Vitals shown include unvalidated device data.  Last Pain:  Vitals:   06/13/18 1200  TempSrc: Oral  PainSc: 0-No pain      Patients Stated Pain Goal: 3 (14/43/92 6599)  Complications: No apparent anesthesia complications

## 2018-06-14 ENCOUNTER — Encounter (HOSPITAL_COMMUNITY): Payer: Self-pay | Admitting: Neurological Surgery

## 2018-06-14 LAB — GLUCOSE, CAPILLARY
Glucose-Capillary: 157 mg/dL — ABNORMAL HIGH (ref 70–99)
Glucose-Capillary: 165 mg/dL — ABNORMAL HIGH (ref 70–99)

## 2018-06-14 MED ORDER — HYDROCODONE-ACETAMINOPHEN 5-325 MG PO TABS: 1.0000 | ORAL_TABLET | ORAL | 0 refills | Status: DC | PRN

## 2018-06-14 NOTE — Evaluation (Signed)
Occupational Therapy Evaluation Patient Details Name: Jeremiah [name] MRN: [id number] DOB: 07/21/1940 Today's Date: 06/14/2018    History of Present Illness 78 yo admitted with NPH s/p ventriculoperitoneal shunt. PMHx: prostate CA, BPH, central retinal artery occlusion, DM, HTN, HLD, arthritis   Clinical Impression   This 78 y/o male presents with the above. At baseline pt reports he is mod independent with ADL and functional mobility using SPC. Pt completing room level mobility using RW this session with overall minguard assist, intermittent cues for maintaining close proximity to RW and for increasing step-length. Pt currently requires setup for seated UB ADL, minguard assist for standing grooming, toileting, and LB ADL. Pt reports he will return home with spouse who is able to assist with ADL/IADL PRN. Will continue to follow while he remains in acute setting to maximize his safety and independence with ADL and mobility prior to discharge home.     Follow Up Recommendations  No OT follow up;Supervision/Assistance - 24 hour    Equipment Recommendations  None recommended by OT           Precautions / Restrictions Precautions Precautions: Fall Restrictions Weight Bearing Restrictions: No      Mobility Bed Mobility Overal bed mobility: Needs Assistance Bed Mobility: Rolling;Sidelying to Sit Rolling: Min guard Sidelying to sit: Mod assist       General bed mobility comments: increased effort and use of bedrail; assist to bring trunk upright  Transfers Overall transfer level: Needs assistance Equipment used: Rolling walker (2 wheeled) Transfers: Sit to/from Stand Sit to Stand: Min guard         General transfer comment: VCs for safe hand placement, no physical assist required; stood from EOB and toilet     Balance Overall balance assessment: Needs assistance Sitting-balance support: Feet supported Sitting balance-Leahy Scale: Good     Standing balance support:  Single extremity supported;Bilateral upper extremity supported;During functional activity Standing balance-Leahy Scale: Fair Standing balance comment: reliant on bil UE support for dynamic mobility; use of single UE support and intermittently no UE support during grooming tasks with minguard for safety with static balance                            ADL either performed or assessed with clinical judgement   ADL Overall ADL's : Needs assistance/impaired Eating/Feeding: Modified independent   Grooming: Min guard;Supervision/safety;Standing;Wash/dry face;Oral care   Upper Body Bathing: Set up;Sitting   Lower Body Bathing: Min guard;Sit to/from stand Lower Body Bathing Details (indicate cue type and reason): educated on use of shower seat for LB/UB bathing to increase safety; pt reports he often takes RW into shower with him for increased safety Upper Body Dressing : Set up;Sitting   Lower Body Dressing: Min guard;Sit to/from stand Lower Body Dressing Details (indicate cue type and reason): pt able to utilize figure 4 technique in seated position; educated to use this method for LB ADL  Toilet Transfer: Min guard;Ambulation;Regular Toilet;RW   Toileting- Water quality scientist and Hygiene: Min guard;Sit to/from stand Toileting - Clothing Manipulation Details (indicate cue type and reason): pt performing gown mangement with minguard for safety   Tub/Shower Transfer Details (indicate cue type and reason): verbally reviewed safe transfer techniques with RW Functional mobility during ADLs: Min guard;Rolling walker General ADL Comments: cues for step length and maintaining close proximity to RW with mobility; educated on care for incision during ADL and once cleared to shower  Pertinent Vitals/Pain Pain Assessment: Faces Faces Pain Scale: Hurts little more Pain Location: mild discomfort in abdomen Pain Descriptors / Indicators: Discomfort Pain  Intervention(s): Monitored during session;Limited activity within patient's tolerance;Repositioned     Hand Dominance     Extremity/Trunk Assessment Upper Extremity Assessment Upper Extremity Assessment: Overall WFL for tasks assessed   Lower Extremity Assessment Lower Extremity Assessment: Defer to PT evaluation       Communication Communication Communication: No difficulties   Cognition Arousal/Alertness: Awake/alert Behavior During Therapy: WFL for tasks assessed/performed Overall Cognitive Status: Within Functional Limits for tasks assessed                                     General Comments       Exercises     Shoulder Instructions      Home Living Family/patient expects to be discharged to:: Private residence Living Arrangements: Spouse/significant other Available Help at Discharge: Family Type of Home: House Home Access: Stairs to enter Technical brewer of Steps: 1-2   Home Layout: Laundry or work area in basement;Two level;Able to live on main level with bedroom/bathroom(doesn't need to go to basement)     Bathroom Shower/Tub: Occupational psychologist: (comfort height)     Home Equipment: Environmental consultant - 2 wheels;Shower seat - built in;Cane - single point          Prior Functioning/Environment Level of Independence: Independent with assistive device(s)        Comments: reports using SPC for ambulation        OT Problem List: Decreased strength;Decreased knowledge of use of DME or AE;Impaired balance (sitting and/or standing);Decreased activity tolerance      OT Treatment/Interventions: Self-care/ADL training;Therapeutic exercise;DME and/or AE instruction;Therapeutic activities;Balance training;Patient/family education    OT Goals(Current goals can be found in the care plan section) Acute Rehab OT Goals Patient Stated Goal: planning for home today OT Goal Formulation: With patient Time For Goal Achievement:  06/28/18 Potential to Achieve Goals: Good  OT Frequency: Min 2X/week   Barriers to D/C:            Co-evaluation              AM-PAC OT "6 Clicks" Daily Activity     Outcome Measure Help from another person eating meals?: None Help from another person taking care of personal grooming?: None Help from another person toileting, which includes using toliet, bedpan, or urinal?: None Help from another person bathing (including washing, rinsing, drying)?: A Little Help from another person to put on and taking off regular upper body clothing?: None Help from another person to put on and taking off regular lower body clothing?: A Little 6 Click Score: 22   End of Session Equipment Utilized During Treatment: Gait belt;Rolling walker Nurse Communication: Mobility status  Activity Tolerance: Patient tolerated treatment well Patient left: in chair;with call bell/phone within reach;with chair alarm set  OT Visit Diagnosis: Unsteadiness on feet (R26.81);Muscle weakness (generalized) (M62.81)                Time: 8546-2703 OT Time Calculation (min): 29 min Charges:  OT General Charges $OT Visit: 1 Visit OT Evaluation $OT Eval Moderate Complexity: 1 Mod OT Treatments $Self Care/Home Management : 8-22 mins  Lou Cal, OT [facility] Pager [phone] Office [phone]   Raymondo Band 06/14/2018, 11:33 AM

## 2018-06-14 NOTE — Discharge Summary (Signed)
Physician Discharge Summary  Patient ID: Jeremiah [name] MRN: [id number] DOB/AGE: 1940-07-19 78 y.o.  Admit date: 06/13/2018 Discharge date: 06/14/2018  Admission Diagnoses: Normal pressure hydrocephalus with gait instability  Discharge Diagnoses: same   Discharged Condition: good  Hospital Course: The patient was admitted on 06/13/2018 and taken to the operating room where the patient underwent right VP shunt. The patient tolerated the procedure well and was taken to the recovery room and then to the floor in stable condition. The hospital course was routine. There were no complications. The wound remained clean dry and intact. Pt had appropriate head soreness. The patient remained afebrile with stable vital signs, and tolerated a regular diet. The patient continued to increase activities, and pain was well controlled with oral pain medications.   Consults: None  Significant Diagnostic Studies:  Results for orders placed or performed during the hospital encounter of 40/98/11  Basic metabolic panel  Result Value Ref Range   Sodium 142 135 - 145 mmol/L   Potassium 3.6 3.5 - 5.1 mmol/L   Chloride 107 98 - 111 mmol/L   CO2 24 22 - 32 mmol/L   Glucose, Bld 114 (H) 70 - 99 mg/dL   BUN 20 8 - 23 mg/dL   Creatinine, Ser 1.19 0.61 - 1.24 mg/dL   Calcium 8.9 8.9 - 10.3 mg/dL   GFR calc non Af Amer 58 (L) >60 mL/min   GFR calc Af Amer >60 >60 mL/min   Anion gap 11 5 - 15  CBC WITH DIFFERENTIAL  Result Value Ref Range   WBC 6.7 4.0 - 10.5 K/uL   RBC 5.53 4.22 - 5.81 MIL/uL   Hemoglobin 17.2 (H) 13.0 - 17.0 g/dL   HCT 53.5 (H) 39.0 - 52.0 %   MCV 96.7 80.0 - 100.0 fL   MCH 31.1 26.0 - 34.0 pg   MCHC 32.1 30.0 - 36.0 g/dL   RDW 12.9 11.5 - 15.5 %   Platelets 168 150 - 400 K/uL   nRBC 0.0 0.0 - 0.2 %   Neutrophils Relative % 66 %   Neutro Abs 4.5 1.7 - 7.7 K/uL   Lymphocytes Relative 20 %   Lymphs Abs 1.3 0.7 - 4.0 K/uL   Monocytes Relative 10 %   Monocytes Absolute 0.6 0.1 - 1.0  K/uL   Eosinophils Relative 2 %   Eosinophils Absolute 0.1 0.0 - 0.5 K/uL   Basophils Relative 1 %   Basophils Absolute 0.1 0.0 - 0.1 K/uL   Immature Granulocytes 1 %   Abs Immature Granulocytes 0.07 0.00 - 0.07 K/uL  Protime-INR  Result Value Ref Range   Prothrombin Time 13.2 11.4 - 15.2 seconds   INR 1.01   Glucose, capillary  Result Value Ref Range   Glucose-Capillary 111 (H) 70 - 99 mg/dL  Glucose, capillary  Result Value Ref Range   Glucose-Capillary 122 (H) 70 - 99 mg/dL  Glucose, capillary  Result Value Ref Range   Glucose-Capillary 150 (H) 70 - 99 mg/dL  Glucose, capillary  Result Value Ref Range   Glucose-Capillary 157 (H) 70 - 99 mg/dL    Chest 2 View  Result Date: 06/13/2018 CLINICAL DATA:  Preoperative study for shunt insertion. EXAM: CHEST - 2 VIEW COMPARISON:  06/10/2016. FINDINGS: Mediastinum and hilar structures normal. Heart size normal. Mild bibasilar subsegmental atelectasis and/or scarring again noted. Elevation right hemidiaphragm again noted. No pleural effusion or pneumothorax. Degenerative change thoracic spine. No acute bony abnormality. IMPRESSION: Mild bibasilar subsegmental atelectasis and/or scarring and elevation  right hemidiaphragm again noted. No interim change. No acute abnormality identified Electronically Signed   By: Marcello Moores  Register   On: 06/13/2018 12:47    Antibiotics:  Anti-infectives (From admission, onward)   Start     Dose/Rate Route Frequency Ordered Stop   06/13/18 2200  ceFAZolin (ANCEF) IVPB 1 g/50 mL premix     1 g 100 mL/hr over 30 Minutes Intravenous Every 8 hours 06/13/18 2033 06/14/18 0553   06/13/18 1405  bacitracin 50,000 Units in sodium chloride 0.9 % 500 mL irrigation  Status:  Discontinued       As needed 06/13/18 1405 06/13/18 1555   06/13/18 1152  ceFAZolin (ANCEF) 2-4 GM/100ML-% IVPB    Note to Pharmacy:  Jasmine Pang   : cabinet override      06/13/18 1152 06/13/18 1428   06/13/18 1145  ceFAZolin (ANCEF) IVPB  2g/100 mL premix     2 g 200 mL/hr over 30 Minutes Intravenous On call to O.R. 06/13/18 1143 06/13/18 1458      Discharge Exam: Blood pressure 120/69, pulse 90, temperature 97.7 F (36.5 C), temperature source Oral, resp. rate 17, height 5\' 9"  (1.753 m), weight 83.9 kg, SpO2 93 %. Neurologic: Grossly normal Ambulating and voiding well  Discharge Medications:   Allergies as of 06/14/2018   No Known Allergies     Medication List    TAKE these medications   hydrochlorothiazide 12.5 MG capsule Commonly known as:  MICROZIDE Take 12.5 mg by mouth daily.   HYDROcodone-acetaminophen 5-325 MG tablet Commonly known as:  NORCO/VICODIN Take 1 tablet by mouth every 4 (four) hours as needed for moderate pain ((score 4 to 6)).   potassium chloride 10 MEQ tablet Commonly known as:  K-DUR Take 20 mEq by mouth daily.   PRESERVISION AREDS 2 PO Take 1 tablet by mouth daily.   vitamin B-12 1000 MCG tablet Commonly known as:  CYANOCOBALAMIN Take 1,000 mcg by mouth daily.            Durable Medical Equipment  (From admission, onward)         Start     Ordered   06/13/18 2034  DME Walker rolling  Once    Question:  Patient needs a walker to treat with the following condition  Answer:  Gait instability   06/13/18 2033          Disposition: home   Final Dx: Normal pressure hydrocephalus  Discharge Instructions    Call MD for:  difficulty breathing, headache or visual disturbances   Complete by:  As directed    Call MD for:  hives   Complete by:  As directed    Call MD for:  persistant dizziness or light-headedness   Complete by:  As directed    Call MD for:  persistant nausea and vomiting   Complete by:  As directed    Call MD for:  redness, tenderness, or signs of infection (pain, swelling, redness, odor or green/yellow discharge around incision site)   Complete by:  As directed    Call MD for:  severe uncontrolled pain   Complete by:  As directed    Call MD for:   temperature >100.4   Complete by:  As directed    Diet - low sodium heart healthy   Complete by:  As directed    Driving Restrictions   Complete by:  As directed    No driving for 2 weeks, no riding in the car for 1 week  Increase activity slowly   Complete by:  As directed          Signed: Ocie Cornfield Khristin Keleher 06/14/2018, 10:22 AM

## 2018-06-14 NOTE — Discharge Instructions (Signed)
*  Please remove bandage tomorrow.*        Call MD for:  difficulty breathing, headache or visual disturbances   Complete by:  As directed    Call MD for:  hives   Complete by:  As directed    Call MD for:  persistant dizziness or light-headedness   Complete by:  As directed    Call MD for:  persistant nausea and vomiting   Complete by:  As directed    Call MD for:  redness, tenderness, or signs of infection (pain, swelling, redness, odor or green/yellow discharge around incision site)   Complete by:  As directed    Call MD for:  severe uncontrolled pain   Complete by:  As directed    Call MD for:  temperature >100.4   Complete by:  As directed    Diet - low sodium heart healthy   Complete by:  As directed    Driving Restrictions   Complete by:  As directed    No driving for 2 weeks, no riding in the car for 1 week   Increase activity slowly

## 2018-06-29 DIAGNOSIS — H6123 Impacted cerumen, bilateral: Secondary | ICD-10-CM | POA: Diagnosis not present

## 2018-07-10 ENCOUNTER — Other Ambulatory Visit: Payer: Self-pay | Admitting: Neurological Surgery

## 2018-07-10 DIAGNOSIS — G9389 Other specified disorders of brain: Secondary | ICD-10-CM

## 2018-07-29 DIAGNOSIS — L570 Actinic keratosis: Secondary | ICD-10-CM | POA: Diagnosis not present

## 2018-07-29 DIAGNOSIS — L821 Other seborrheic keratosis: Secondary | ICD-10-CM | POA: Diagnosis not present

## 2018-07-29 DIAGNOSIS — Z85828 Personal history of other malignant neoplasm of skin: Secondary | ICD-10-CM | POA: Diagnosis not present

## 2018-07-30 ENCOUNTER — Ambulatory Visit
Admission: RE | Admit: 2018-07-30 | Discharge: 2018-07-30 | Disposition: A | Discharge status: Home / Self Care | Payer: Medicare Other | Source: Ambulatory Visit | Attending: Neurological Surgery | Admitting: Neurological Surgery

## 2018-07-30 DIAGNOSIS — Z982 Presence of cerebrospinal fluid drainage device: Secondary | ICD-10-CM | POA: Diagnosis not present

## 2018-07-30 DIAGNOSIS — G9389 Other specified disorders of brain: Secondary | ICD-10-CM

## 2018-07-30 DIAGNOSIS — G919 Hydrocephalus, unspecified: Secondary | ICD-10-CM | POA: Diagnosis not present

## 2018-08-07 ENCOUNTER — Telehealth: Payer: Self-pay | Admitting: Neurology

## 2018-08-07 NOTE — Telephone Encounter (Signed)
Just had CT from Dr. Ronnald Ramp. Do not need to order this testing.

## 2018-08-07 NOTE — Telephone Encounter (Signed)
-----   Message from Jeremiah [name], [place] sent at 05/30/2018  9:54 AM EST ----- Jeremiah [name], CMA sent to Jeremiah [name], CMA    Tat, Eustace Quail, DO sent to Jeremiah [name], CMA      Needs repeat CT brain without in 10 months; f/u 1 year. Dx: hydrocephalous

## 2018-08-08 ENCOUNTER — Ambulatory Visit: Payer: Medicare Other | Admitting: Adult Health

## 2018-08-08 ENCOUNTER — Ambulatory Visit: Payer: Medicare Other | Admitting: Neurology

## 2018-08-27 ENCOUNTER — Other Ambulatory Visit: Payer: Self-pay | Admitting: Student

## 2018-08-27 DIAGNOSIS — G9389 Other specified disorders of brain: Secondary | ICD-10-CM

## 2018-09-09 ENCOUNTER — Other Ambulatory Visit: Payer: Self-pay

## 2018-09-09 ENCOUNTER — Ambulatory Visit
Admission: RE | Admit: 2018-09-09 | Discharge: 2018-09-09 | Disposition: A | Discharge status: Home / Self Care | Payer: Medicare Other | Source: Ambulatory Visit | Attending: Student | Admitting: Student

## 2018-09-09 DIAGNOSIS — G9389 Other specified disorders of brain: Secondary | ICD-10-CM

## 2018-09-10 DIAGNOSIS — G9389 Other specified disorders of brain: Secondary | ICD-10-CM | POA: Diagnosis not present

## 2018-09-17 DIAGNOSIS — K8020 Calculus of gallbladder without cholecystitis without obstruction: Secondary | ICD-10-CM | POA: Diagnosis not present

## 2018-09-17 DIAGNOSIS — N200 Calculus of kidney: Secondary | ICD-10-CM | POA: Diagnosis not present

## 2018-09-17 DIAGNOSIS — R1031 Right lower quadrant pain: Secondary | ICD-10-CM | POA: Diagnosis not present

## 2018-09-17 DIAGNOSIS — I10 Essential (primary) hypertension: Secondary | ICD-10-CM | POA: Diagnosis not present

## 2018-09-17 DIAGNOSIS — I714 Abdominal aortic aneurysm, without rupture: Secondary | ICD-10-CM | POA: Diagnosis not present

## 2018-09-17 DIAGNOSIS — Z982 Presence of cerebrospinal fluid drainage device: Secondary | ICD-10-CM | POA: Diagnosis not present

## 2018-09-18 DIAGNOSIS — Z982 Presence of cerebrospinal fluid drainage device: Secondary | ICD-10-CM | POA: Diagnosis not present

## 2018-09-18 DIAGNOSIS — R1031 Right lower quadrant pain: Secondary | ICD-10-CM | POA: Diagnosis not present

## 2018-10-22 DIAGNOSIS — I10 Essential (primary) hypertension: Secondary | ICD-10-CM | POA: Diagnosis not present

## 2018-10-22 DIAGNOSIS — Z6827 Body mass index (BMI) 27.0-27.9, adult: Secondary | ICD-10-CM | POA: Diagnosis not present

## 2018-10-22 DIAGNOSIS — G9389 Other specified disorders of brain: Secondary | ICD-10-CM | POA: Diagnosis not present

## 2018-10-24 ENCOUNTER — Other Ambulatory Visit: Payer: Self-pay

## 2018-10-24 ENCOUNTER — Emergency Department (HOSPITAL_COMMUNITY): Payer: Medicare Other

## 2018-10-24 ENCOUNTER — Encounter (HOSPITAL_COMMUNITY): Payer: Self-pay | Admitting: Emergency Medicine

## 2018-10-24 ENCOUNTER — Emergency Department (HOSPITAL_COMMUNITY)
Admission: EM | Admit: 2018-10-24 | Discharge: 2018-10-24 | Disposition: A | Discharge status: Home / Self Care | Payer: Medicare Other | Attending: Emergency Medicine | Admitting: Emergency Medicine

## 2018-10-24 DIAGNOSIS — R1030 Lower abdominal pain, unspecified: Secondary | ICD-10-CM | POA: Insufficient documentation

## 2018-10-24 DIAGNOSIS — Z79899 Other long term (current) drug therapy: Secondary | ICD-10-CM | POA: Diagnosis not present

## 2018-10-24 DIAGNOSIS — Z87442 Personal history of urinary calculi: Secondary | ICD-10-CM | POA: Insufficient documentation

## 2018-10-24 DIAGNOSIS — Z8546 Personal history of malignant neoplasm of prostate: Secondary | ICD-10-CM | POA: Diagnosis not present

## 2018-10-24 DIAGNOSIS — E119 Type 2 diabetes mellitus without complications: Secondary | ICD-10-CM | POA: Diagnosis not present

## 2018-10-24 DIAGNOSIS — N200 Calculus of kidney: Secondary | ICD-10-CM | POA: Diagnosis not present

## 2018-10-24 DIAGNOSIS — R1084 Generalized abdominal pain: Secondary | ICD-10-CM | POA: Diagnosis not present

## 2018-10-24 DIAGNOSIS — Z87891 Personal history of nicotine dependence: Secondary | ICD-10-CM | POA: Diagnosis not present

## 2018-10-24 DIAGNOSIS — K8020 Calculus of gallbladder without cholecystitis without obstruction: Secondary | ICD-10-CM | POA: Diagnosis not present

## 2018-10-24 DIAGNOSIS — R1031 Right lower quadrant pain: Secondary | ICD-10-CM | POA: Diagnosis not present

## 2018-10-24 DIAGNOSIS — R1032 Left lower quadrant pain: Secondary | ICD-10-CM | POA: Diagnosis not present

## 2018-10-24 DIAGNOSIS — K760 Fatty (change of) liver, not elsewhere classified: Secondary | ICD-10-CM | POA: Diagnosis not present

## 2018-10-24 DIAGNOSIS — I10 Essential (primary) hypertension: Secondary | ICD-10-CM | POA: Diagnosis not present

## 2018-10-24 DIAGNOSIS — R52 Pain, unspecified: Secondary | ICD-10-CM | POA: Diagnosis not present

## 2018-10-24 LAB — CBC WITH DIFFERENTIAL/PLATELET
Abs Immature Granulocytes: 0.09 10*3/uL — ABNORMAL HIGH (ref 0.00–0.07)
Basophils Absolute: 0 10*3/uL (ref 0.0–0.1)
Basophils Relative: 0 %
Eosinophils Absolute: 0.2 10*3/uL (ref 0.0–0.5)
Eosinophils Relative: 2 %
HCT: 47.1 % (ref 39.0–52.0)
Hemoglobin: 15.6 g/dL (ref 13.0–17.0)
Immature Granulocytes: 1 %
Lymphocytes Relative: 15 %
Lymphs Abs: 1.3 10*3/uL (ref 0.7–4.0)
MCH: 31.3 pg (ref 26.0–34.0)
MCHC: 33.1 g/dL (ref 30.0–36.0)
MCV: 94.6 fL (ref 80.0–100.0)
Monocytes Absolute: 0.7 10*3/uL (ref 0.1–1.0)
Monocytes Relative: 8 %
Neutro Abs: 6.6 10*3/uL (ref 1.7–7.7)
Neutrophils Relative %: 74 %
Platelets: 249 10*3/uL (ref 150–400)
RBC: 4.98 MIL/uL (ref 4.22–5.81)
RDW: 12.6 % (ref 11.5–15.5)
WBC: 8.8 10*3/uL (ref 4.0–10.5)
nRBC: 0 % (ref 0.0–0.2)

## 2018-10-24 LAB — COMPREHENSIVE METABOLIC PANEL
ALT: 15 U/L (ref 0–44)
AST: 22 U/L (ref 15–41)
Albumin: 3.3 g/dL — ABNORMAL LOW (ref 3.5–5.0)
Alkaline Phosphatase: 63 U/L (ref 38–126)
Anion gap: 11 (ref 5–15)
BUN: 17 mg/dL (ref 8–23)
CO2: 27 mmol/L (ref 22–32)
Calcium: 9 mg/dL (ref 8.9–10.3)
Chloride: 102 mmol/L (ref 98–111)
Creatinine, Ser: 1.05 mg/dL (ref 0.61–1.24)
GFR calc Af Amer: >60 mL/min (ref >60)
GFR calc non Af Amer: >60 mL/min (ref >60)
Glucose, Bld: 120 mg/dL — ABNORMAL HIGH (ref 70–99)
Potassium: 3.4 mmol/L — ABNORMAL LOW (ref 3.5–5.1)
Sodium: 140 mmol/L (ref 135–145)
Total Bilirubin: 0.7 mg/dL (ref 0.3–1.2)
Total Protein: 6.6 g/dL (ref 6.5–8.1)

## 2018-10-24 LAB — URINALYSIS, ROUTINE W REFLEX MICROSCOPIC
Bilirubin Urine: NEGATIVE
Glucose, UA: NEGATIVE mg/dL
Hgb urine dipstick: NEGATIVE
Ketones, ur: NEGATIVE mg/dL
Leukocytes,Ua: NEGATIVE
Nitrite: NEGATIVE
Protein, ur: NEGATIVE mg/dL
Specific Gravity, Urine: 1.023 (ref 1.005–1.030)
pH: 5 (ref 5.0–8.0)

## 2018-10-24 LAB — LIPASE, BLOOD: Lipase: 41 U/L (ref 11–51)

## 2018-10-24 MED ORDER — IOHEXOL 300 MG/ML  SOLN
100.0000 mL | Freq: Once | INTRAMUSCULAR | Status: AC | PRN
  Administered 2018-10-24: 100 mL via INTRAVENOUS

## 2018-10-24 NOTE — ED Triage Notes (Signed)
Patient in via [facility] from home c/o lower abdominal pain x 6 days, initially starting LLQ, now localized to RLQ. Pain okay unless he's moving around, no rebound tenderness. Denies N/V/D, urinary symptoms, or fevers/chills. No blood stools. Endorses history of diverticulitis, but a long time ago and is uncertain if this feels the same.   EMS VS: 136/80, P 90, RR 20, CBG 116, temp 98.7 tympanic.

## 2018-10-24 NOTE — Discharge Instructions (Signed)
You CT today was within normal limits aside from the infrarenal abdominal aortic aneurysm which is currently stable. Please follow up with your primary care physician in 1 week. If you experience any chest pain, shortness of breath or fever please return to the ED.

## 2018-10-24 NOTE — ED Provider Notes (Signed)
[facility] EMERGENCY DEPARTMENT Provider Note   CSN: [id number] Arrival date & time: 10/24/18  1741    History   Chief Complaint Chief Complaint  Patient presents with   Abdominal Pain    HPI Jeremiah [name] is a 78 y.o. male.     78 y.o male with a PMH of BPH, DM, HTN, Diverticulitis presents to the ED via EMS with a chief complaint of abdominal pain x 3 weeks. Patient states he has seen his primary care physician multiple times for the same complaint, had a CT abdomen done about 3 weeks ago which showed no abnormalities.  He reports his abdominal pain has now returned it has increasingly gotten worse since Friday, he reports since Monday morning he has been taking Tylenol every 4 hours for relieving symptoms, prior to having Tylenol he reports the pain is a 6 out of 10, mainly located along the right lower quadrant with some radiation to the left lower quadrant.  Patient also reports he had a right sided stent placed for hydrocephalus which acess was obtained via his abdomen.  He reports having a bowel movement prior to arrival, states it was slightly loose but no diarrhea.He denies any nausea, vomiting, diarrhea, fever, headache, blurry vision or weakness.      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   S/P radiation therapy 04/23/13 - 05/29/14   Prostate/seminal vesicles, external beam 4500 cGy in 25 sessions   Seasonal allergies    Skin cancer    scalp   Wears glasses     Patient Active Problem List   Diagnosis Date Noted   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   Small bowel obstruction ([place]) 06/10/2016   HLD  (hyperlipidemia) 06/10/2016   HTN (hypertension) 06/10/2016   Acute kidney injury ([place]) 06/10/2016   Dehydration, moderate 06/10/2016   AAA (abdominal aortic aneurysm) ([place]) 06/10/2016   OSA on CPAP 06/10/2016   Malignant neoplasm of prostate s/p radiation 2015 03/10/2014    Past Surgical History:  Procedure Laterality Date   BACK SURGERY  2009   COLONOSCOPY W/ BIOPSIES AND POLYPECTOMY     [place] BIOPSY  2013, 2014, 01/2014   Gleason 7   RADIOACTIVE SEED IMPLANT N/A 07/01/2014   Procedure: RADIOACTIVE SEED IMPLANT;  Surgeon: [name], MD;  Location: [facility];  Service: Urology;  Laterality: N/A;  DR PORTABLE   TONSILLECTOMY     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   Procedure: SHUNT INSERTION VENTRICULAR-PERITONEAL;  Surgeon: [name], MD;  Location: [place];  Service: Neurosurgery;  Laterality: N/A;  SHUNT INSERTION VENTRICULAR-PERITONEAL        Home Medications    Prior to Admission medications   Medication Sig Start Date End Date Taking? Authorizing Provider  acetaminophen (TYLENOL) 500 MG tablet Take 500 mg by mouth every 6 (six) hours as needed for mild pain.   Yes [provider]  hydrochlorothiazide (MICROZIDE) 12.5 MG capsule Take 12.5 mg by mouth daily.  07/14/15  Yes [provider]  Multiple Vitamins-Minerals (PRESERVISION  AREDS 2 PO) Take 1 tablet by mouth daily.    Yes [provider]  potassium chloride (K-DUR) 10 MEQ tablet Take 20 mEq by mouth daily.  06/16/15  Yes [provider]  vitamin B-12 (CYANOCOBALAMIN) 1000 MCG tablet Take 1,000 mcg by mouth daily.   Yes [provider]    Family History Family History  Problem Relation Age of Onset   Heart attack Father 17   Cancer Father        prostate   Diabetes Father    Cancer Paternal Uncle        prostate   Dementia Mother 74   Lung cancer Brother     Social History Social History     Tobacco Use   Smoking status: Former Smoker    Types: Cigarettes    Last attempt to quit: 03/10/1961    Years since quitting: 57.6   Smokeless tobacco: Never Used  Substance Use Topics   Alcohol use: Yes    Alcohol/week: 1.0 standard drinks    Types: 1 Glasses of wine per week    Comment: rare alcohol   Drug use: No     Allergies   Patient has no known allergies.   Review of Systems Review of Systems  Constitutional: Negative for chills and fever.  HENT: Negative for ear pain and sore throat.   Eyes: Negative for pain and visual disturbance.  Respiratory: Negative for cough and shortness of breath.   Cardiovascular: Negative for chest pain and palpitations.  Gastrointestinal: Positive for abdominal pain. Negative for diarrhea, nausea and vomiting.  Genitourinary: Negative for dysuria and hematuria.  Musculoskeletal: Negative for arthralgias and back pain.  Skin: Negative for color change and rash.  Neurological: Negative for seizures and syncope.  All other systems reviewed and are negative.    Physical Exam Updated Vital Signs BP (!) 142/83    Pulse 93    Temp 98.6 F (37 C) (Oral)    Resp (!) 28    SpO2 93%   Physical Exam Vitals signs and nursing note reviewed.  Constitutional:      Appearance: He is well-developed.     Comments: Non ill appearing.   HENT:     Head: Normocephalic and atraumatic.  Eyes:     General: No scleral icterus.    Pupils: Pupils are equal, round, and reactive to light.  Neck:     Musculoskeletal: Normal range of motion.  Cardiovascular:     Heart sounds: Normal heart sounds.  Pulmonary:     Effort: Pulmonary effort is normal.     Breath sounds: Normal breath sounds. No wheezing.  Chest:     Chest wall: No tenderness.  Abdominal:     General: Bowel sounds are normal. There is no distension.     Palpations: Abdomen is soft.     Tenderness: There is abdominal tenderness in the right lower quadrant and left lower quadrant.  There is no right CVA tenderness, left CVA tenderness or guarding. Positive signs include McBurney's sign. Negative signs include Murphy's sign.  Musculoskeletal:        General: No tenderness or deformity.  Skin:    General: Skin is warm and dry.  Neurological:     Mental Status: He is alert and oriented to person, place, and time.     Comments: Alert, oriented, thought content appropriate. Speech fluent without evidence of aphasia. Able to follow 2 step commands without difficulty.  Cranial Nerves:  II:  Peripheral visual  fields grossly normal, pupils, round, reactive to light III,IV, VI: ptosis not present, extra-ocular motions intact bilaterally  V,VII: smile symmetric, facial light touch sensation equal VIII: hearing grossly normal bilaterally  IX,X: midline uvula rise  XI: bilateral shoulder shrug equal and strong XII: midline tongue extension  Motor:  5/5 in upper and lower extremities bilaterally including strong and equal grip strength and dorsiflexion/plantar flexion Sensory: light touch normal in all extremities.  Cerebellar: normal finger-to-nose with bilateral upper extremities, pronator drift negative        ED Treatments / Results  Labs (all labs ordered are listed, but only abnormal results are displayed) Labs Reviewed  CBC WITH DIFFERENTIAL/PLATELET - Abnormal; Notable for the following components:      Result Value   Abs Immature Granulocytes 0.09 (*)    All other components within normal limits  COMPREHENSIVE METABOLIC PANEL - Abnormal; Notable for the following components:   Potassium 3.4 (*)    Glucose, Bld 120 (*)    Albumin 3.3 (*)    All other components within normal limits  LIPASE, BLOOD  URINALYSIS, ROUTINE W REFLEX MICROSCOPIC    EKG EKG Interpretation  Date/Time:  Thursday October 24 2018 17:47:16 EDT Ventricular Rate:  96 PR Interval:    QRS Duration: 111 QT Interval:  356 QTC Calculation: 450 R Axis:   59 Text Interpretation:  Sinus  rhythm Consider left atrial enlargement Low voltage, precordial leads Borderline T wave abnormalities No significant change since 1/18 Confirmed by Aletta Edouard [phone]) on 10/24/2018 6:33:02 PM   Radiology Ct Abdomen Pelvis W Contrast  Result Date: 10/24/2018 CLINICAL DATA:  Lower abdominal pain for the past 6 days, initially beginning in the left lower quadrant and now in the right lower quadrant. EXAM: CT ABDOMEN AND PELVIS WITH CONTRAST TECHNIQUE: Multidetector CT imaging of the abdomen and pelvis was performed using the standard protocol following bolus administration of intravenous contrast. CONTRAST:  136mL OMNIPAQUE IOHEXOL 300 MG/ML  SOLN COMPARISON:  Abdomen and pelvis CT dated 06/10/2016. FINDINGS: Lower chest: Coronary artery calcifications. Mild atelectasis or scarring at both lung bases. Hepatobiliary: Diffuse low density of the liver relative to the spleen. Multiple tiny gallstones layering in the dependent portion of the gallbladder, measuring up to 3 mm in maximum diameter each. No gallbladder wall thickening or pericholecystic fluid. Pancreas: Unremarkable. No pancreatic ductal dilatation or surrounding inflammatory changes. Spleen: Normal in size without focal abnormality. Adrenals/Urinary Tract: Multiple right renal calculi measuring up to 5 mm in maximum diameter each. Normal appearing adrenal glands, ureters and urinary bladder. Stomach/Bowel: Small hiatal hernia. Multiple colonic diverticula without evidence of diverticulitis. No evidence of appendicitis. Vascular/Lymphatic: Again demonstrated is focal aneurysmal dilatation of the infrarenal abdominal aorta with eccentric thrombus on the left. This measures 3.8 cm in maximum diameter, previously 3.4 cm. Atheromatous arterial calcifications. Reproductive: Prostate radiation seed implants. Other: Small left inguinal hernia containing fat. Small umbilical hernia containing fat. Intraperitoneal shunt catheter tip in the right mid abdomen.  Musculoskeletal: Lumbar and lower thoracic spine degenerative changes. Lower lumbar spine fixation hardware. IMPRESSION: 1. No acute abnormality. 2. Multiple nonobstructing right renal calculi. 3. Diffuse hepatic steatosis. 4. Cholelithiasis. 5. Colonic diverticulosis. 6. 3.8 cm infrarenal abdominal aortic aneurysm with eccentric thrombus on the left with a mild interval increase in size. Recommend followup by ultrasound in 2 years. This recommendation follows ACR consensus guidelines: White Paper of the ACR Incidental Findings Committee II on Vascular Findings. J Am Coll Radiol 2013; 10:789-794. 7. Small hiatal hernia. Electronically Signed  By: Claudie Revering M.D.   On: 10/24/2018 19:44    Procedures Procedures (including critical care time)  Medications Ordered in ED Medications  iohexol (OMNIPAQUE) 300 MG/ML solution 100 mL (100 mLs Intravenous Contrast Given 10/24/18 1921)     Initial Impression / Assessment and Plan / ED Course  I have reviewed the triage vital signs and the nursing notes.  Pertinent labs & imaging results that were available during my care of the patient were reviewed by me and considered in my medical decision making (see chart for details).  Clinical Course as of Oct 23 2137  Thu Oct 23, 5340  2281 78 year old male with history of diverticulitis and history of hydrocephalus requiring a shunt here with a week's worth of low abdominal pain that started in left lower quadrant and now his right lower quadrant.  No fever here.  Otherwise is nontoxic-appearing.  He is getting some screening labs and will ultimately get a CT.   [MB]    Clinical Course User Index [MB] Hayden Rasmussen, MD      Patient with a previous history of diverticulitis presents to the ED with complaints of lower abdominal pain which began about a week ago.  He was also seen by his primary care physician 3 weeks ago where he had a CT abdomen obtained which showed no acute process.  Today during  evaluation patient reports pain along the right lower quadrant, reports that sometimes migrates to the left lower quadrant.  He has been taking Tylenol every 4 hours for the pain since Monday. She otherwise is well-appearing potassium level slightly decreased, glucose is slightly elevated, LFTs are within normal limits.  CBC showed no leukocytosis, hemoglobin is within normal limits.  Lipase level is within normal limits.  UA showed no nitrates, leukocytes, white blood cell count.  Due to patient's pain along with age and risk factors will obtain CT imaging to further evaluate. CT abdomen showed: 1. No acute abnormality.  2. Multiple nonobstructing right renal calculi.  3. Diffuse hepatic steatosis.  4. Cholelithiasis.  5. Colonic diverticulosis.  6. 3.8 cm infrarenal abdominal aortic aneurysm with eccentric  thrombus on the left with a mild interval increase in size.  Recommend followup by ultrasound in 2 years. This recommendation  follows ACR consensus guidelines: White Paper of the ACR Incidental  Findings Committee II on Vascular Findings. J Am Coll Radiol 2013;  10:789-794.  7. Small hiatal hernia.    Will place call for vascular Dr. [name] for their recommendations at this time.  08:49 PM spoke to Dr. [name] of vascular who advised aneurysm size and location are inconsistent with patient's pain at this time.  He be happy to see patient on an outpatient basis but at this time reports he does not need any immediate surgical intervention. 9:21 PM CT results were discussed with patient at length, he does have multiple hernias in the umbilical region along with the left inguinal region, none that I can palpate at this time. I have discussed this patient with Dr. Melina Copa who has seen and evaluated patient and agrees with management.  Patient is to follow-up with primary care physician, he agrees and understands plan.  He is to return to the emergency department if he experiences any fever,  worsening symptoms.  Patient stable for discharge.   Portions of this note were generated with [employer]. Dictation errors may occur despite best attempts at proofreading.  Final Clinical Impressions(s) / ED Diagnoses   Final  diagnoses:  Lower abdominal pain    ED Discharge Orders    None       Janeece Fitting, Hershal Coria 10/24/18 2139    Hayden Rasmussen, MD 10/25/18 1046

## 2018-10-24 NOTE — ED Notes (Signed)
Provider at the bedside.  

## 2018-10-24 NOTE — ED Notes (Signed)
Patient transported to CT 

## 2018-11-05 DIAGNOSIS — C61 Malignant neoplasm of prostate: Secondary | ICD-10-CM | POA: Diagnosis not present

## 2018-11-11 DIAGNOSIS — C61 Malignant neoplasm of prostate: Secondary | ICD-10-CM | POA: Diagnosis not present

## 2018-11-11 DIAGNOSIS — N3943 Post-void dribbling: Secondary | ICD-10-CM | POA: Diagnosis not present

## 2018-11-15 ENCOUNTER — Other Ambulatory Visit: Payer: Self-pay

## 2018-11-15 DIAGNOSIS — I714 Abdominal aortic aneurysm, without rupture, unspecified: Secondary | ICD-10-CM

## 2018-11-18 ENCOUNTER — Telehealth (HOSPITAL_COMMUNITY): Payer: Self-pay | Admitting: Rehabilitation

## 2018-11-18 NOTE — Telephone Encounter (Signed)

## 2018-11-19 ENCOUNTER — Other Ambulatory Visit: Payer: Self-pay

## 2018-11-19 ENCOUNTER — Ambulatory Visit (HOSPITAL_COMMUNITY)
Admission: RE | Admit: 2018-11-19 | Discharge: 2018-11-19 | Disposition: A | Discharge status: Home / Self Care | Payer: Medicare Other | Source: Ambulatory Visit | Attending: Vascular Surgery | Admitting: Vascular Surgery

## 2018-11-19 ENCOUNTER — Encounter: Payer: Self-pay | Admitting: Vascular Surgery

## 2018-11-19 ENCOUNTER — Ambulatory Visit: Payer: Medicare Other | Admitting: Vascular Surgery

## 2018-11-19 VITALS — BP 122/79 | HR 96 | Temp 99.1°F | Resp 16 | Ht 70.0 in | Wt 175.0 lb

## 2018-11-19 DIAGNOSIS — I714 Abdominal aortic aneurysm, without rupture, unspecified: Secondary | ICD-10-CM

## 2018-11-19 NOTE — Progress Notes (Signed)
Patient name: DELLA HOMAN MRN: [id number] DOB: December 03, 1940 Sex: male  REASON FOR CONSULT: 3.8 cm infrarenal aneurysm  HPI: KAYMAN SNUFFER is a 78 y.o. male, with history of hydrocephalus status post VP shunt, hypertension, hyperlipidemia, history of prostate cancer status post radiation therapy that presents for evaluation of a 3.8 cm AAA recently noted on CT.  Patient does not have any prior knowledge of the aneurysm.  He states since his VP shunt was put in back in January and he has had some bilateral lower outer quadrant abdominal pain.  No new back pain since his back surgery with Dr. Ronnald Ramp.  Abdominal pain is not midline.  States it comes and goes without any aggravating factors.  His only abdominal surgery is the shunt procedure with right upper quadrant incision.  States he quit smoking in the 1960s.  Is otherwise active.  In review of old imaging his aneurysm was noted on a CT 06/10/2016 and at that time measured 3.4 cm.  Past Medical History:  Diagnosis Date  . BPH (benign prostatic hyperplasia)   . Central retinal artery occlusion   . Diabetes mellitus without complication (HCC)    diet controlled  . Diverticulosis   . History of kidney stones   . Hyperlipidemia   . Hypertension   . OSA on CPAP    wears cpap  . Osteoarthritis   . 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 S/P radiation therapy 04/23/13 - 05/29/14   Prostate/seminal vesicles, external beam 4500 cGy in 25 sessions  . Seasonal allergies   . Skin cancer    scalp  . Wears glasses     Past Surgical History:  Procedure Laterality Date  . BACK SURGERY  2009  . COLONOSCOPY W/ BIOPSIES AND POLYPECTOMY    . [place]  . [place] BIOPSY  2013, 2014, 01/2014   Gleason 7  . RADIOACTIVE SEED IMPLANT N/A 07/01/2014   Procedure: RADIOACTIVE SEED IMPLANT;  Surgeon: [name], MD;  Location: [facility];  Service: Urology;  Laterality: N/A;  DR PORTABLE  .  TONSILLECTOMY    . 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   Procedure: SHUNT INSERTION VENTRICULAR-PERITONEAL;  Surgeon: [name], MD;  Location: [place];  Service: Neurosurgery;  Laterality: N/A;  SHUNT INSERTION VENTRICULAR-PERITONEAL    Family History  Problem Relation Age of Onset  . Heart attack Father 22  . Cancer Father        prostate  . Diabetes Father   . Cancer Paternal Uncle        prostate  . Dementia Mother 9  . Lung cancer Brother     SOCIAL HISTORY: Social History   Socioeconomic History  . Marital status: Married    Spouse name: Not on file  . Number of children: 2  . Years of education: Not on file  . Highest education level: Not on file  Occupational History  . 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 Financial resource strain: Not on file  . Food insecurity    Worry: Never true    Inability: Never true  . Transportation needs    Medical: No    Non-medical: No  Tobacco Use  . Smoking status: Former Smoker    Types: Cigarettes    Quit date: 03/10/1961    Years since quitting: 57.7  . Smokeless tobacco: Never Used  Substance and Sexual Activity  .  Alcohol use: Yes    Alcohol/week: 1.0 standard drinks    Types: 1 Glasses of wine per week    Comment: rare alcohol  . Drug use: No  . Sexual activity: Not on file  Lifestyle  . Physical activity    Days per week: Patient refused    Minutes per session: Patient refused  . Stress: Only a little  Relationships  . Social connections    Talks on phone: More than three times a week    Gets together: More than three times a week    Attends religious service: Not on file    Active member of club or organization: Not on file    Attends meetings of clubs or organizations: Not on file    Relationship status: Not on file  . Intimate partner violence    Fear of current or ex partner: No    Emotionally abused: No    Physically abused: No    Forced sexual activity: No   Other Topics Concern  . Not on file  Social History Narrative   Rare caffeine use     No Known Allergies  Current Outpatient Medications  Medication Sig Dispense Refill  . acetaminophen (TYLENOL) 500 MG tablet Take 500 mg by mouth every 6 (six) hours as needed for mild pain.    [name] Kitchen aspirin EC 81 MG tablet Take by mouth.    . hydrochlorothiazide (MICROZIDE) 12.5 MG capsule Take 12.5 mg by mouth daily.   0  . MELATONIN PO Take by mouth at bedtime.    . Multiple Vitamins-Minerals (PRESERVISION AREDS 2 PO) Take 1 tablet by mouth daily.     . potassium chloride (K-DUR) 10 MEQ tablet Take 20 mEq by mouth daily.   2  . vitamin B-12 (CYANOCOBALAMIN) 1000 MCG tablet Take 1,000 mcg by mouth daily.     No current facility-administered medications for this visit.     REVIEW OF SYSTEMS:  [X]  denotes positive finding, [ ]  denotes negative finding Cardiac  Comments:  Chest pain or chest pressure:    Shortness of breath upon exertion:    Short of breath when lying flat:    Irregular heart rhythm:        Vascular    Pain in calf, thigh, or hip brought on by ambulation:    Pain in feet at night that wakes you up from your sleep:     Blood clot in your veins:    Leg swelling:         Pulmonary    Oxygen at home:    Productive cough:     Wheezing:         Neurologic    Sudden weakness in arms or legs:     Sudden numbness in arms or legs:     Sudden onset of difficulty speaking or slurred speech:    Temporary loss of vision in one eye:     Problems with dizziness:         Gastrointestinal    Blood in stool:     Vomited blood:         Genitourinary    Burning when urinating:     Blood in urine:        Psychiatric    Major depression:         Hematologic    Bleeding problems:    Problems with blood clotting too easily:        Skin    Rashes or ulcers:  Constitutional    Fever or chills:      PHYSICAL EXAM: Vitals:   11/19/18 1004  BP: 122/79  Pulse: 96  Resp:  16  Temp: 99.1 F (37.3 C)  TempSrc: Temporal  SpO2: 94%  Weight: 175 lb (79.4 kg)  Height: 5\' 10"  (1.778 m)    GENERAL: The patient is a well-nourished male, in no acute distress. The vital signs are documented above. CARDIAC: There is a regular rate and rhythm.  VASCULAR:  2+ radial pulse palpable bilaterally 2+ femoral pulse palpable bilaterally 2+ DP pulse palpable bilaterally PULMONARY: There is good air exchange bilaterally without wheezing or rales. ABDOMEN: Soft and non-tender with normal pitched bowel sounds.  No tenderness with deep palpation in midline Well healed right upper quadrant incision MUSCULOSKELETAL: There are no major deformities or cyanosis. NEUROLOGIC: No focal weakness or paresthesias are detected. SKIN: There are no ulcers or rashes noted. PSYCHIATRIC: The patient has a normal affect.  DATA:   CT abdomen pelvis from 10/23/2016: he has an approximate 3.8 cm infrarenal aneurysm with a long neck and good access, no stranding.  AAA duplex today showed the largest aortic measurement of 4 cm in the infrarenal segment.  Assessment/Plan:  78 year old male who presents with 3.8 cm infrarenal abdominal aortic aneurysm as recently measured on CT from 10/2018.  Discussed with patient in detail that he does not need any surgical intervention at this time and that surveillance would be appropriate.  I think he is asymptomatic from an aneurysm standpoint.  We will plan to see him back in 1 year for AAA duplex.  Discussed that we typically offer repair greater than 5.5 cm in men unless there is rapid growth in the aneurysm.  His aneurysm is relatively unchanged from 3.4 cm in 2018 to 3.8 cm this year.  Call with questions or concerns.   Marty Heck, MD [facility] Office: [phone] Pager: [place]

## 2018-11-20 DIAGNOSIS — E119 Type 2 diabetes mellitus without complications: Secondary | ICD-10-CM | POA: Diagnosis not present

## 2018-11-20 DIAGNOSIS — H353111 Nonexudative age-related macular degeneration, right eye, early dry stage: Secondary | ICD-10-CM | POA: Diagnosis not present

## 2018-11-20 DIAGNOSIS — H2513 Age-related nuclear cataract, bilateral: Secondary | ICD-10-CM | POA: Diagnosis not present

## 2019-01-16 DIAGNOSIS — G9389 Other specified disorders of brain: Secondary | ICD-10-CM | POA: Diagnosis not present

## 2019-03-03 DIAGNOSIS — E7849 Other hyperlipidemia: Secondary | ICD-10-CM | POA: Diagnosis not present

## 2019-03-03 DIAGNOSIS — R7301 Impaired fasting glucose: Secondary | ICD-10-CM | POA: Diagnosis not present

## 2019-03-03 DIAGNOSIS — Z125 Encounter for screening for malignant neoplasm of prostate: Secondary | ICD-10-CM | POA: Diagnosis not present

## 2019-03-04 DIAGNOSIS — R82998 Other abnormal findings in urine: Secondary | ICD-10-CM | POA: Diagnosis not present

## 2019-03-04 DIAGNOSIS — I10 Essential (primary) hypertension: Secondary | ICD-10-CM | POA: Diagnosis not present

## 2019-03-05 LAB — IFOBT (OCCULT BLOOD): IFOBT: NEGATIVE

## 2019-03-10 DIAGNOSIS — Z1331 Encounter for screening for depression: Secondary | ICD-10-CM | POA: Diagnosis not present

## 2019-03-10 DIAGNOSIS — R0609 Other forms of dyspnea: Secondary | ICD-10-CM | POA: Diagnosis not present

## 2019-03-10 DIAGNOSIS — I714 Abdominal aortic aneurysm, without rupture: Secondary | ICD-10-CM | POA: Diagnosis not present

## 2019-03-10 DIAGNOSIS — Z982 Presence of cerebrospinal fluid drainage device: Secondary | ICD-10-CM | POA: Diagnosis not present

## 2019-03-10 DIAGNOSIS — Z0000 Encounter for general adult medical examination without abnormal findings: Secondary | ICD-10-CM | POA: Diagnosis not present

## 2019-03-11 ENCOUNTER — Ambulatory Visit (INDEPENDENT_AMBULATORY_CARE_PROVIDER_SITE_OTHER): Payer: Medicare Other | Admitting: Cardiology

## 2019-03-11 ENCOUNTER — Other Ambulatory Visit: Payer: Self-pay | Admitting: Cardiology

## 2019-03-11 ENCOUNTER — Encounter: Payer: Self-pay | Admitting: Cardiology

## 2019-03-11 ENCOUNTER — Other Ambulatory Visit: Payer: Self-pay

## 2019-03-11 VITALS — BP 128/90 | HR 96 | Ht 70.0 in | Wt 179.2 lb

## 2019-03-11 DIAGNOSIS — E7800 Pure hypercholesterolemia, unspecified: Secondary | ICD-10-CM | POA: Diagnosis not present

## 2019-03-11 DIAGNOSIS — R5383 Other fatigue: Secondary | ICD-10-CM | POA: Diagnosis not present

## 2019-03-11 DIAGNOSIS — E119 Type 2 diabetes mellitus without complications: Secondary | ICD-10-CM

## 2019-03-11 DIAGNOSIS — I10 Essential (primary) hypertension: Secondary | ICD-10-CM | POA: Diagnosis not present

## 2019-03-11 DIAGNOSIS — I209 Angina pectoris, unspecified: Secondary | ICD-10-CM

## 2019-03-11 MED ORDER — SODIUM CHLORIDE 0.9% FLUSH: 3.0000 mL | Freq: Two times a day (BID) | INTRAVENOUS | Status: DC

## 2019-03-11 NOTE — Patient Instructions (Addendum)
Medication Instructions:  Continue same medications   Lab Work: None ordered  Testing/Procedures: Cardiac Cath scheduled Friday 03/14/19 with Dr.McAhaney  Follow instructions below  Follow-Up: At [facility], you and your health needs are our priority.  As part of our continuing mission to provide you with exceptional heart care, we have created designated Provider Care Teams.  These Care Teams include your primary Cardiologist (physician) and Advanced Practice Providers (APPs -  Physician Assistants and Nurse Practitioners) who all work together to provide you with the care you need, when you need it.  Your next appointment:  Follow up cardiac cath  Wednesday 04/02/19 at 1:40 pm       Jeremiah [name] Jeremiah [name] [place] [address] Dept: [phone] Loc: [place]  03/11/2019  You are scheduled for a Cardiac Cath on Friday 03/14/19, with Dr. Julianne Handler.  1. Please arrive at the [facility] (Main Entrance A) at [facility]: [address] [place], [place] [address] at 7:00 am (This time is two hours before your procedure to ensure your preparation). Free valet parking service is available.   Special note: Every effort is made to have your procedure done on time. Please understand that emergencies sometimes delay scheduled procedures.  2. Diet: Do not eat solid foods after midnight.  The patient may have clear liquids until 5am upon the day of the procedure.  3. Labs: Already done with Dr.Perini   You will need Covid test to be done at [facility] site drive thru Wed 28/76/81 at 10:00 am Pre Procedure Line  4. Medication instructions in preparation for your procedure:   Hold HCTZ morning of cath      On the morning of your procedure, take your Aspirin 81 mg and any morning medicines NOT listed above.  You may use sips of water.  5. Plan for one night  stay--bring personal belongings. 6. Bring a current list of your medications and current insurance cards. 7. You MUST have a responsible person to drive you home. 8. Someone MUST be with you the first 24 hours after you arrive home or your discharge will be delayed. 9. Please wear clothes that are easy to get on and off and wear slip-on shoes.  Thank you for allowing [place] to care for you!   -- [facility]

## 2019-03-11 NOTE — Progress Notes (Signed)
Cardiology Office Note   Date:  03/11/2019   ID:  Virgie, Chery 27-Jun-1940, MRN [id number]  PCP:  [name], MD  Cardiologist:    [name], MD   Chief Complaint  Patient presents with  . Fatigue      History of Present Illness: Jeremiah [name] is a 78 y.o. male who is seen at the request of Dr. Joylene Draft for evaluation of periods of exhaustion. He was seen one year ago with some symptoms of fatigue and dyspnea. He has a history of HTN, HLD, and OSA on CPAP.  He also has a history of DM diet controlled and family history of CAD. We performed a Myoview last year and it was normal.  More recently he notes some symptoms of marked exhaustion.  This past Saturday he was in the yard with his grandson not doing much of anything but suddenly became very exhausted. Had to sit down.  6 months ago  he noted similar symptoms walking to the end of the block < 1000 feet. He had to sit down on side of road.  No chest pain. 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   Past Medical History:  Diagnosis Date  . BPH (benign prostatic hyperplasia)   . Central retinal artery occlusion   . Diabetes mellitus without complication (HCC)    diet controlled  . Diverticulosis   . History of kidney stones   . Hyperlipidemia   . Hypertension   . OSA on CPAP    wears cpap  . Osteoarthritis   . 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 S/P radiation therapy 04/23/13 - 05/29/14   Prostate/seminal vesicles, external beam 4500 cGy in 25 sessions  . Seasonal allergies   . Skin cancer    scalp  . Wears glasses     Past Surgical History:  Procedure Laterality Date  . BACK SURGERY  2009  . COLONOSCOPY W/ BIOPSIES AND POLYPECTOMY    . [place]  . PROSTATE BIOPSY  2013, 2014, 01/2014   Gleason 7  . RADIOACTIVE SEED IMPLANT N/A  07/01/2014   Procedure: RADIOACTIVE SEED IMPLANT;  Surgeon: [name], MD;  Location: [facility];  Service: Urology;  Laterality: N/A;  DR PORTABLE  . TONSILLECTOMY    . 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   Procedure: SHUNT INSERTION VENTRICULAR-PERITONEAL;  Surgeon: [name], MD;  Location: [place];  Service: Neurosurgery;  Laterality: N/A;  SHUNT INSERTION VENTRICULAR-PERITONEAL     Current Outpatient Medications  Medication Sig Dispense Refill  . acetaminophen (TYLENOL) 500 MG tablet Take 500 mg by mouth every 6 (six) hours as needed for mild pain.    [name] Kitchen aspirin EC 81 MG tablet Take by mouth.    . hydrochlorothiazide (MICROZIDE) 12.5 MG capsule Take 12.5 mg by mouth daily.   0  . MELATONIN PO Take by mouth at bedtime.    . Multiple Vitamins-Minerals (PRESERVISION AREDS 2 PO) Take 1 tablet by mouth daily.     . potassium chloride (K-DUR) 10 MEQ tablet Take 20 mEq by mouth daily.   2  . vitamin B-12 (CYANOCOBALAMIN) 1000 MCG tablet Take 1,000 mcg by mouth daily.     No current facility-administered medications for this visit.     Allergies:   Patient has no known  allergies.    Social History:  The patient  reports that he quit smoking about 58 years ago. His smoking use included cigarettes. He has never used smokeless tobacco. He reports current alcohol use of about 1.0 standard drinks of alcohol per week. He reports that he does not use drugs.   Family History:  The patient's family history includes Cancer in his father and paternal uncle; Dementia (age of onset: 11) in his mother; Diabetes in his father; Heart attack (age of onset: 36) in his father; Lung cancer in his brother.    ROS:  Please see the history of present illness.   Otherwise, review of systems are positive for none.   All other systems are reviewed and negative.    PHYSICAL EXAM: VS:  BP 128/90   Pulse 96   Ht 5\' 10"  (1.778 m)   Wt 179 lb 3.2 oz (81.3 kg)   BMI 25.71 kg/m  , BMI  Body mass index is 25.71 kg/m. GEN: Well nourished, well developed, in no acute distress  HEENT: normal  Neck: no JVD, carotid bruits, or masses Cardiac: RRR; no murmurs, rubs, or gallops,no edema  Respiratory:  clear to auscultation bilaterally, normal work of breathing GI: soft, nontender, nondistended, + BS, central obesity. MS: no deformity or atrophy  Skin: warm and dry, no rash Neuro:  Nonfocal. Walks slowly with some imbalance. Psych: euthymic mood, full affect   EKG:  EKG is ordered today. The ekg ordered today demonstrates NSR rate 86. Low voltage. I have personally reviewed and interpreted this study.    Recent Labs: 04/09/2018: TSH 1.14 10/24/2018: ALT 15; BUN 17; Creatinine, Ser 1.05; Hemoglobin 15.6; Platelets 249; Potassium 3.4; Sodium 140    Lipid Panel No results found for: CHOL, TRIG, HDL, CHOLHDL, VLDL, LDLCALC, LDLDIRECT    Labs dated 01/31/18: cholesterol 220, triglycerides 138, HDL 51, LDL 141. A1c 5.4%. Creatinine 1.3. Other chemistries, CBC, TSH normal.  Dated 03/03/19: glucose 176, Creatinine 1.1. otherwise CMET, CBC, UA normal. Cholesterol 181, triglycerides 294, HDL 39, LDL 83. PSA normal. A1c 6%.   Wt Readings from Last 3 Encounters:  03/11/19 179 lb 3.2 oz (81.3 kg)  11/19/18 175 lb (79.4 kg)  06/13/18 185 lb (83.9 kg)      Other studies Reviewed: Myoview 02/14/18: Study Highlights    The left ventricular ejection fraction is normal (55-65%).  Nuclear stress EF: 63%.  There was no ST segment deviation noted during stress.  The study is normal.   Normal resting and stress perfusion. No ischemia or infarction EF 63%      ASSESSMENT AND PLAN:  1.  Symptoms of sudden exertional fatigue/exhaustion. I am concerned that this is an anginal equivalent in patient with multiple cardiac risk factors. Exam and Ecg are benign. Myoview study one year ago was normal. He was noted to have coronary calcification on CT this past year. I think we need  to pursue definitive evaluation. Discussed coronary CTA versus cardiac cath. I would favor cardiac cath. The procedure and risks were reviewed including but not limited to death, myocardial infarction, stroke, arrythmias, bleeding, transfusion, emergency surgery, dye allergy, or renal dysfunction. The patient voices understanding and is agreeable to proceed. He states these symptoms are "scary" and would like this done as soon as possible. We will get him COVID tested and plan on doing this Friday.  2. DM type 2 diet controlled. 3. HTN controlled 4. Hypercholesterolemia. LDL 83 on statin 5. 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   Current medicines are reviewed at length with the patient today.  The patient does not have concerns regarding medicines.  The following changes have been made:  no change     Signed,  [name], MD  03/11/2019 3:58 PM    [facility] [address], [place], [place], [address] Phone [phone], Fax [phone]

## 2019-03-11 NOTE — H&P (View-Only) (Signed)
Cardiology Office Note   Date:  03/11/2019   ID:  Jeremiah [name], Jeremiah [name] Jan 21, 1941, MRN [id number]  PCP:  Jeremiah Infante, MD  Cardiologist:   Jeremiah Byrns [name], MD   Chief Complaint  Patient presents with  . Fatigue      History of Present Illness: Jeremiah [name] is a 78 y.o. male who is seen at the request of Dr. Joylene [name] for evaluation of periods of exhaustion. He was seen one year ago with some symptoms of fatigue and dyspnea. He has a history of HTN, HLD, and OSA on CPAP.  He also has a history of DM diet controlled and family history of CAD. We performed a Myoview last year and it was normal.  More recently he notes some symptoms of marked exhaustion.  This past Saturday he was in the yard with his grandson not doing much of anything but suddenly became very exhausted. Had to sit down.  6 months ago  he noted similar symptoms walking to the end of the block < 1000 feet. He had to sit down on side of road.  No chest pain. 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   Past Medical History:  Diagnosis Date  . BPH (benign prostatic hyperplasia)   . Central retinal artery occlusion   . Diabetes mellitus without complication (HCC)    diet controlled  . Diverticulosis   . History of kidney stones   . Hyperlipidemia   . Hypertension   . OSA on CPAP    wears cpap  . Osteoarthritis   . 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 S/P radiation therapy 04/23/13 - 05/29/14   Prostate/seminal vesicles, external beam 4500 cGy in 25 sessions  . Seasonal allergies   . Skin cancer    scalp  . Wears glasses     Past Surgical History:  Procedure Laterality Date  . BACK SURGERY  2009  . COLONOSCOPY W/ BIOPSIES AND POLYPECTOMY    . [place]  . PROSTATE BIOPSY  2013, 2014, 01/2014   Gleason 7  . RADIOACTIVE SEED IMPLANT N/A  07/01/2014   Procedure: RADIOACTIVE SEED IMPLANT;  Surgeon: Jeremiah Amass, MD;  Location: [facility];  Service: Urology;  Laterality: N/A;  DR PORTABLE  . TONSILLECTOMY    . 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   Procedure: SHUNT INSERTION VENTRICULAR-PERITONEAL;  Surgeon: Jeremiah Moore, MD;  Location: [facility];  Service: Neurosurgery;  Laterality: N/A;  SHUNT INSERTION VENTRICULAR-PERITONEAL     Current Outpatient Medications  Medication Sig Dispense Refill  . acetaminophen (TYLENOL) 500 MG tablet Take 500 mg by mouth every 6 (six) hours as needed for mild pain.    [name] Kitchen aspirin EC 81 MG tablet Take by mouth.    . hydrochlorothiazide (MICROZIDE) 12.5 MG capsule Take 12.5 mg by mouth daily.   0  . MELATONIN PO Take by mouth at bedtime.    . Multiple Vitamins-Minerals (PRESERVISION AREDS 2 PO) Take 1 tablet by mouth daily.     . potassium chloride (K-DUR) 10 MEQ tablet Take 20 mEq by mouth daily.   2  . vitamin B-12 (CYANOCOBALAMIN) 1000 MCG tablet Take 1,000 mcg by mouth daily.     No current facility-administered medications for this visit.     Allergies:   Patient has no known  allergies.    Social History:  The patient  reports that he quit smoking about 58 years ago. His smoking use included cigarettes. He has never used smokeless tobacco. He reports current alcohol use of about 1.0 standard drinks of alcohol per week. He reports that he does not use drugs.   Family History:  The patient's family history includes Cancer in his father and paternal uncle; Dementia (age of onset: 1) in his mother; Diabetes in his father; Heart attack (age of onset: 75) in his father; Lung cancer in his brother.    ROS:  Please see the history of present illness.   Otherwise, review of systems are positive for none.   All other systems are reviewed and negative.    PHYSICAL EXAM: VS:  BP 128/90   Pulse 96   Ht 5\' 10"  (1.778 m)   Wt 179 lb 3.2 oz (81.3 kg)   BMI 25.71 kg/m  , BMI  Body mass index is 25.71 kg/m. GEN: Well nourished, well developed, in no acute distress  HEENT: normal  Neck: no JVD, carotid bruits, or masses Cardiac: RRR; no murmurs, rubs, or gallops,no edema  Respiratory:  clear to auscultation bilaterally, normal work of breathing GI: soft, nontender, nondistended, + BS, central obesity. MS: no deformity or atrophy  Skin: warm and dry, no rash Neuro:  Nonfocal. Walks slowly with some imbalance. Psych: euthymic mood, full affect   EKG:  EKG is ordered today. The ekg ordered today demonstrates NSR rate 86. Low voltage. I have personally reviewed and interpreted this study.    Recent Labs: 04/09/2018: TSH 1.14 10/24/2018: ALT 15; BUN 17; Creatinine, Ser 1.05; Hemoglobin 15.6; Platelets 249; Potassium 3.4; Sodium 140    Lipid Panel No results found for: CHOL, TRIG, HDL, CHOLHDL, VLDL, LDLCALC, LDLDIRECT    Labs dated 01/31/18: cholesterol 220, triglycerides 138, HDL 51, LDL 141. A1c 5.4%. Creatinine 1.3. Other chemistries, CBC, TSH normal.  Dated 03/03/19: glucose 176, Creatinine 1.1. otherwise CMET, CBC, UA normal. Cholesterol 181, triglycerides 294, HDL 39, LDL 83. PSA normal. A1c 6%.   Wt Readings from Last 3 Encounters:  03/11/19 179 lb 3.2 oz (81.3 kg)  11/19/18 175 lb (79.4 kg)  06/13/18 185 lb (83.9 kg)      Other studies Reviewed: Myoview 02/14/18: Study Highlights    The left ventricular ejection fraction is normal (55-65%).  Nuclear stress EF: 63%.  There was no ST segment deviation noted during stress.  The study is normal.   Normal resting and stress perfusion. No ischemia or infarction EF 63%      ASSESSMENT AND PLAN:  1.  Symptoms of sudden exertional fatigue/exhaustion. I am concerned that this is an anginal equivalent in patient with multiple cardiac risk factors. Exam and Ecg are benign. Myoview study one year ago was normal. He was noted to have coronary calcification on CT this past year. I think we need  to pursue definitive evaluation. Discussed coronary CTA versus cardiac cath. I would favor cardiac cath. The procedure and risks were reviewed including but not limited to death, myocardial infarction, stroke, arrythmias, bleeding, transfusion, emergency surgery, dye allergy, or renal dysfunction. The patient voices understanding and is agreeable to proceed. He states these symptoms are "scary" and would like this done as soon as possible. We will get him COVID tested and plan on doing this Friday.  2. DM type 2 diet controlled. 3. HTN controlled 4. Hypercholesterolemia. LDL 83 on statin 5. 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   Current medicines are reviewed at length with the patient today.  The patient does not have concerns regarding medicines.  The following changes have been made:  no change     Signed, Odessia Asleson [name], MD  03/11/2019 3:58 PM    [facility] [address] [place], [place], [address] Phone [phone], Fax [phone]

## 2019-03-12 ENCOUNTER — Other Ambulatory Visit: Payer: Medicare Other

## 2019-03-12 ENCOUNTER — Other Ambulatory Visit (HOSPITAL_COMMUNITY)
Admission: RE | Admit: 2019-03-12 | Discharge: 2019-03-12 | Disposition: A | Discharge status: Home / Self Care | Payer: Medicare Other | Source: Ambulatory Visit | Attending: Cardiovascular Disease | Admitting: Cardiovascular Disease

## 2019-03-12 DIAGNOSIS — Z01812 Encounter for preprocedural laboratory examination: Secondary | ICD-10-CM | POA: Diagnosis not present

## 2019-03-12 DIAGNOSIS — Z20828 Contact with and (suspected) exposure to other viral communicable diseases: Secondary | ICD-10-CM | POA: Insufficient documentation

## 2019-03-12 LAB — SARS CORONAVIRUS 2 (TAT 6-24 HRS): SARS Coronavirus 2: NEGATIVE

## 2019-03-13 ENCOUNTER — Telehealth: Payer: Self-pay | Admitting: *Deleted

## 2019-03-13 NOTE — Telephone Encounter (Addendum)
Pt contacted pre-catheterization scheduled at [facility] for: Friday March 14, 2019 9 AM Verified arrival time and place: [facility]) at: 7 AM   No solid food after midnight prior to cath, clear liquids until 5 AM day of procedure. Contrast allergy: no  Hold: HCTZ/KCl-AM of procedure  Except hold medications AM meds can be  taken pre-cath with sip of water including: ASA 81 mg   Confirmed patient has responsible adult to drive home post procedure and observe 24 hours after arriving home: yes  Currently, due to Covid-19 pandemic, only one support person will be allowed with patient. Must be the same support person for that patient's entire stay, will be screened and required to wear a mask. They will be asked to wait in the waiting room for the duration of the patient's stay.  Patients are required to wear a mask when they enter the hospital.      COVID-19 Pre-Screening Questions:  . In the past 7 to 10 days have you had a cough,  shortness of breath, headache, congestion, fever (100 or greater) body aches, chills, sore throat, or sudden loss of taste or sense of smell? no . Have you been around anyone with known Covid 19? no . Have you been around anyone who is awaiting Covid 19 test results in the past 7 to 10 days? no . Have you been around anyone who has been exposed to Covid 19, or has mentioned symptoms of Covid 19 within the past 7 to 10 days? no    I have reviewed procedure/mask/visitor instructions, Covid-19 screening questions with patient, he verbalized understanding, thanked me for call.

## 2019-03-13 NOTE — Telephone Encounter (Signed)
Follow up: ° ° °Patient returning call back. Please call patient. °

## 2019-03-13 NOTE — Telephone Encounter (Signed)
LMTCB to review instructions

## 2019-03-14 ENCOUNTER — Ambulatory Visit (HOSPITAL_COMMUNITY)
Admission: RE | Admit: 2019-03-14 | Discharge: 2019-03-14 | Disposition: A | Discharge status: Home / Self Care | Payer: Medicare Other | Attending: Cardiovascular Disease | Admitting: Cardiovascular Disease

## 2019-03-14 ENCOUNTER — Other Ambulatory Visit: Payer: Self-pay

## 2019-03-14 ENCOUNTER — Encounter (HOSPITAL_COMMUNITY): Payer: Self-pay | Admitting: Cardiovascular Disease

## 2019-03-14 ENCOUNTER — Encounter (HOSPITAL_COMMUNITY)
Admission: RE | Disposition: A | Discharge status: Home / Self Care | Payer: Self-pay | Source: Home / Self Care | Attending: Cardiovascular Disease

## 2019-03-14 DIAGNOSIS — E119 Type 2 diabetes mellitus without complications: Secondary | ICD-10-CM | POA: Diagnosis not present

## 2019-03-14 DIAGNOSIS — R5383 Other fatigue: Secondary | ICD-10-CM | POA: Diagnosis not present

## 2019-03-14 DIAGNOSIS — Z85828 Personal history of other malignant neoplasm of skin: Secondary | ICD-10-CM | POA: Insufficient documentation

## 2019-03-14 DIAGNOSIS — R269 Unspecified abnormalities of gait and mobility: Secondary | ICD-10-CM | POA: Insufficient documentation

## 2019-03-14 DIAGNOSIS — E785 Hyperlipidemia, unspecified: Secondary | ICD-10-CM | POA: Diagnosis not present

## 2019-03-14 DIAGNOSIS — G919 Hydrocephalus, unspecified: Secondary | ICD-10-CM | POA: Diagnosis not present

## 2019-03-14 DIAGNOSIS — E7800 Pure hypercholesterolemia, unspecified: Secondary | ICD-10-CM | POA: Insufficient documentation

## 2019-03-14 DIAGNOSIS — I2510 Atherosclerotic heart disease of native coronary artery without angina pectoris: Secondary | ICD-10-CM | POA: Diagnosis not present

## 2019-03-14 DIAGNOSIS — R531 Weakness: Secondary | ICD-10-CM

## 2019-03-14 DIAGNOSIS — Z982 Presence of cerebrospinal fluid drainage device: Secondary | ICD-10-CM | POA: Diagnosis not present

## 2019-03-14 DIAGNOSIS — Z8546 Personal history of malignant neoplasm of prostate: Secondary | ICD-10-CM | POA: Insufficient documentation

## 2019-03-14 DIAGNOSIS — M1990 Unspecified osteoarthritis, unspecified site: Secondary | ICD-10-CM | POA: Insufficient documentation

## 2019-03-14 DIAGNOSIS — Z8249 Family history of ischemic heart disease and other diseases of the circulatory system: Secondary | ICD-10-CM | POA: Diagnosis not present

## 2019-03-14 DIAGNOSIS — Z7982 Long term (current) use of aspirin: Secondary | ICD-10-CM | POA: Diagnosis not present

## 2019-03-14 DIAGNOSIS — Z87891 Personal history of nicotine dependence: Secondary | ICD-10-CM | POA: Diagnosis not present

## 2019-03-14 DIAGNOSIS — G4733 Obstructive sleep apnea (adult) (pediatric): Secondary | ICD-10-CM | POA: Insufficient documentation

## 2019-03-14 DIAGNOSIS — N400 Enlarged prostate without lower urinary tract symptoms: Secondary | ICD-10-CM | POA: Insufficient documentation

## 2019-03-14 DIAGNOSIS — Z79899 Other long term (current) drug therapy: Secondary | ICD-10-CM | POA: Insufficient documentation

## 2019-03-14 DIAGNOSIS — I10 Essential (primary) hypertension: Secondary | ICD-10-CM | POA: Insufficient documentation

## 2019-03-14 DIAGNOSIS — I209 Angina pectoris, unspecified: Secondary | ICD-10-CM

## 2019-03-14 HISTORY — PX: LEFT HEART CATH AND CORONARY ANGIOGRAPHY: CATH118249

## 2019-03-14 LAB — GLUCOSE, CAPILLARY: Glucose-Capillary: 126 mg/dL — ABNORMAL HIGH (ref 70–99)

## 2019-03-14 SURGERY — LEFT HEART CATH AND CORONARY ANGIOGRAPHY
Anesthesia: LOCAL

## 2019-03-14 MED ORDER — MIDAZOLAM HCL 2 MG/2ML IJ SOLN
INTRAMUSCULAR | Status: DC | PRN
  Administered 2019-03-14: 1 mg via INTRAVENOUS

## 2019-03-14 MED ORDER — HEPARIN SODIUM (PORCINE) 1000 UNIT/ML IJ SOLN
INTRAMUSCULAR | Status: DC | PRN
  Administered 2019-03-14: 4000 [IU] via INTRAVENOUS

## 2019-03-14 MED ORDER — SODIUM CHLORIDE 0.9% FLUSH: 3.0000 mL | INTRAVENOUS | Status: DC | PRN

## 2019-03-14 MED ORDER — HEPARIN (PORCINE) IN NACL 1000-0.9 UT/500ML-% IV SOLN
INTRAVENOUS | Status: DC | PRN
  Administered 2019-03-14 (×2): 500 mL

## 2019-03-14 MED ORDER — IOHEXOL 350 MG/ML SOLN
INTRAVENOUS | Status: DC | PRN
  Administered 2019-03-14: 70 mL

## 2019-03-14 MED ORDER — LIDOCAINE HCL (PF) 1 % IJ SOLN
INTRAMUSCULAR | Status: DC | PRN
  Administered 2019-03-14: 2 mL

## 2019-03-14 MED ORDER — LIDOCAINE HCL (PF) 1 % IJ SOLN
INTRAMUSCULAR | Status: AC
  Filled 2019-03-14: qty 30

## 2019-03-14 MED ORDER — MIDAZOLAM HCL 2 MG/2ML IJ SOLN
INTRAMUSCULAR | Status: AC
  Filled 2019-03-14: qty 2

## 2019-03-14 MED ORDER — HEPARIN SODIUM (PORCINE) 1000 UNIT/ML IJ SOLN
INTRAMUSCULAR | Status: AC
  Filled 2019-03-14: qty 1

## 2019-03-14 MED ORDER — FENTANYL CITRATE (PF) 100 MCG/2ML IJ SOLN
INTRAMUSCULAR | Status: DC | PRN
  Administered 2019-03-14: 25 ug via INTRAVENOUS

## 2019-03-14 MED ORDER — FENTANYL CITRATE (PF) 100 MCG/2ML IJ SOLN
INTRAMUSCULAR | Status: AC
  Filled 2019-03-14: qty 2

## 2019-03-14 MED ORDER — SODIUM CHLORIDE 0.9 % WEIGHT BASED INFUSION: 1.0000 mL/kg/h | INTRAVENOUS | Status: DC

## 2019-03-14 MED ORDER — SODIUM CHLORIDE 0.9 % IV SOLN: 250.0000 mL | INTRAVENOUS | Status: DC | PRN

## 2019-03-14 MED ORDER — SODIUM CHLORIDE 0.9 % WEIGHT BASED INFUSION
3.0000 mL/kg/h | INTRAVENOUS | Status: AC
  Administered 2019-03-14: 3 mL/kg/h via INTRAVENOUS

## 2019-03-14 MED ORDER — HEPARIN (PORCINE) IN NACL 1000-0.9 UT/500ML-% IV SOLN
INTRAVENOUS | Status: AC
  Filled 2019-03-14: qty 1000

## 2019-03-14 MED ORDER — ASPIRIN 81 MG PO CHEW: 81.0000 mg | CHEWABLE_TABLET | ORAL | Status: DC

## 2019-03-14 MED ORDER — VERAPAMIL HCL 2.5 MG/ML IV SOLN
INTRAVENOUS | Status: AC
  Filled 2019-03-14: qty 2

## 2019-03-14 MED ORDER — VERAPAMIL HCL 2.5 MG/ML IV SOLN
INTRAVENOUS | Status: DC | PRN
  Administered 2019-03-14: 10 mL via INTRA_ARTERIAL

## 2019-03-14 SURGICAL SUPPLY — 10 items
CATH 5FR JL3.5 JR4 ANG PIG MP (CATHETERS) ×1 IMPLANT
DEVICE RAD COMP TR BAND LRG (VASCULAR PRODUCTS) ×1 IMPLANT
GLIDESHEATH SLEND SS 6F .021 (SHEATH) ×1 IMPLANT
GUIDEWIRE INQWIRE 1.5J.035X260 (WIRE) IMPLANT
INQWIRE 1.5J .035X260CM (WIRE) ×2
KIT HEART LEFT (KITS) ×2 IMPLANT
PACK CARDIAC CATHETERIZATION (CUSTOM PROCEDURE TRAY) ×2 IMPLANT
SYR MEDRAD MARK 7 150ML (SYRINGE) ×2 IMPLANT
TRANSDUCER W/STOPCOCK (MISCELLANEOUS) ×2 IMPLANT
TUBING CIL FLEX 10 FLL-RA (TUBING) ×2 IMPLANT

## 2019-03-14 NOTE — Discharge Instructions (Signed)
Radial Site Care  You may shower in 24 hours and remove bandage. Drink plenty of fluids over the next 24-48 hours.  This sheet gives you information about how to care for yourself after your procedure. Your health care provider may also give you more specific instructions. If you have problems or questions, contact your health care provider. What can I expect after the procedure? After the procedure, it is common to have:  Bruising and tenderness at the catheter insertion area. Follow these instructions at home: Medicines  Take over-the-counter and prescription medicines only as told by your health care provider. Insertion site care  Follow instructions from your health care provider about how to take care of your insertion site. Make sure you: ? Wash your hands with soap and water before you change your bandage (dressing). If soap and water are not available, use hand sanitizer. ? Change your dressing as told by your health care provider. ? Leave stitches (sutures), skin glue, or adhesive strips in place. These skin closures may need to stay in place for 2 weeks or longer. If adhesive strip edges start to loosen and curl up, you may trim the loose edges. Do not remove adhesive strips completely unless your health care provider tells you to do that.  Check your insertion site every day for signs of infection. Check for: ? Redness, swelling, or pain. ? Fluid or blood. ? Pus or a bad smell. ? Warmth.  Do not take baths, swim, or use a hot tub until your health care provider approves.  You may shower 24-48 hours after the procedure, or as directed by your health care provider. ? Remove the dressing and gently wash the site with plain soap and water. ? Pat the area dry with a clean towel. ? Do not rub the site. That could cause bleeding.  Do not apply powder or lotion to the site. Activity   For 24 hours after the procedure, or as directed by your health care provider: ? Do not  flex or bend the affected arm. ? Do not push or pull heavy objects with the affected arm. ? Do not drive yourself home from the hospital or clinic. You may drive 24 hours after the procedure unless your health care provider tells you not to. ? Do not operate machinery or power tools.  Do not lift anything that is heavier than 10 lb (4.5 kg), or the limit that you are told, until your health care provider says that it is safe.  Ask your health care provider when it is okay to: ? Return to work or school. ? Resume usual physical activities or sports. ? Resume sexual activity. General instructions  If the catheter site starts to bleed, raise your arm and put firm pressure on the site. If the bleeding does not stop, get help right away. This is a medical emergency.  If you went home on the same day as your procedure, a responsible adult should be with you for the first 24 hours after you arrive home.  Keep all follow-up visits as told by your health care provider. This is important. Contact a health care provider if:  You have a fever.  You have redness, swelling, or yellow drainage around your insertion site. Get help right away if:  You have unusual pain at the radial site.  The catheter insertion area swells very fast.  The insertion area is bleeding, and the bleeding does not stop when you hold steady pressure on the  area.  Your arm or hand becomes pale, cool, tingly, or numb. These symptoms may represent a serious problem that is an emergency. Do not wait to see if the symptoms will go away. Get medical help right away. Call your local emergency services (911 in the [place] Do not drive yourself to the hospital. Summary  After the procedure, it is common to have bruising and tenderness at the site.  Follow instructions from your health care provider about how to take care of your radial site wound. Check the wound every day for signs of infection.  Do not lift anything that is  heavier than 10 lb (4.5 kg), or the limit that you are told, until your health care provider says that it is safe. This information is not intended to replace advice given to you by your health care provider. Make sure you discuss any questions you have with your health care provider. Document Released: 06/10/2010 Document Revised: 06/13/2017 Document Reviewed: 06/13/2017 Elsevier Patient Education  2020 [employer].

## 2019-03-14 NOTE — Interval H&P Note (Signed)
History and Physical Interval Note:  03/14/2019 8:17 AM  [name]  has presented today for cardiac cath with the diagnosis of dyspnea/fatigue.  The various methods of treatment have been discussed with the patient and family. After consideration of risks, benefits and other options for treatment, the patient has consented to  Procedure(s): LEFT HEART CATH AND CORONARY ANGIOGRAPHY (N/A) as a surgical intervention.  The patient's history has been reviewed, patient examined, no change in status, stable for surgery.  I have reviewed the patient's chart and labs.  Questions were answered to the patient's satisfaction.    Cath Lab Visit (complete for each Cath Lab visit)  Clinical Evaluation Leading to the Procedure:   ACS: No.  Non-ACS:    Anginal Classification: CCS II  Anti-ischemic medical therapy: No Therapy  Non-Invasive Test Results: Low-risk stress test findings: cardiac mortality <1%/year  Prior CABG: No previous CABG         Lauree Chandler

## 2019-03-14 NOTE — Progress Notes (Signed)
Instructions reviewed with patient and family. Verbalized understanding.

## 2019-03-24 ENCOUNTER — Emergency Department (HOSPITAL_COMMUNITY): Payer: Medicare Other

## 2019-03-24 ENCOUNTER — Other Ambulatory Visit: Payer: Self-pay

## 2019-03-24 ENCOUNTER — Emergency Department (HOSPITAL_COMMUNITY)
Admission: EM | Admit: 2019-03-24 | Discharge: 2019-03-25 | Disposition: A | Discharge status: Home / Self Care | Payer: Medicare Other | Attending: Emergency Medicine | Admitting: Emergency Medicine

## 2019-03-24 ENCOUNTER — Encounter (HOSPITAL_COMMUNITY): Payer: Self-pay | Admitting: Emergency Medicine

## 2019-03-24 DIAGNOSIS — R509 Fever, unspecified: Secondary | ICD-10-CM | POA: Diagnosis not present

## 2019-03-24 DIAGNOSIS — E119 Type 2 diabetes mellitus without complications: Secondary | ICD-10-CM | POA: Insufficient documentation

## 2019-03-24 DIAGNOSIS — R531 Weakness: Secondary | ICD-10-CM

## 2019-03-24 DIAGNOSIS — Z7982 Long term (current) use of aspirin: Secondary | ICD-10-CM | POA: Insufficient documentation

## 2019-03-24 DIAGNOSIS — R000 Tachycardia, unspecified: Secondary | ICD-10-CM | POA: Diagnosis not present

## 2019-03-24 DIAGNOSIS — I10 Essential (primary) hypertension: Secondary | ICD-10-CM | POA: Insufficient documentation

## 2019-03-24 DIAGNOSIS — R0602 Shortness of breath: Secondary | ICD-10-CM | POA: Diagnosis not present

## 2019-03-24 DIAGNOSIS — Z87891 Personal history of nicotine dependence: Secondary | ICD-10-CM | POA: Diagnosis not present

## 2019-03-24 DIAGNOSIS — Z8546 Personal history of malignant neoplasm of prostate: Secondary | ICD-10-CM | POA: Insufficient documentation

## 2019-03-24 DIAGNOSIS — Z79899 Other long term (current) drug therapy: Secondary | ICD-10-CM | POA: Diagnosis not present

## 2019-03-24 DIAGNOSIS — M6281 Muscle weakness (generalized): Secondary | ICD-10-CM | POA: Insufficient documentation

## 2019-03-24 DIAGNOSIS — U071 COVID-19: Secondary | ICD-10-CM | POA: Diagnosis not present

## 2019-03-24 DIAGNOSIS — R4182 Altered mental status, unspecified: Secondary | ICD-10-CM | POA: Diagnosis not present

## 2019-03-24 DIAGNOSIS — R0902 Hypoxemia: Secondary | ICD-10-CM | POA: Diagnosis not present

## 2019-03-24 LAB — COMPREHENSIVE METABOLIC PANEL WITH GFR
ALT: 12 U/L (ref 0–44)
AST: 23 U/L (ref 15–41)
Albumin: 4 g/dL (ref 3.5–5.0)
Alkaline Phosphatase: 48 U/L (ref 38–126)
Anion gap: 12 (ref 5–15)
BUN: 16 mg/dL (ref 8–23)
CO2: 26 mmol/L (ref 22–32)
Calcium: 9.4 mg/dL (ref 8.9–10.3)
Chloride: 98 mmol/L (ref 98–111)
Creatinine, Ser: 1.17 mg/dL (ref 0.61–1.24)
GFR calc Af Amer: >60 mL/min
GFR calc non Af Amer: 59 mL/min — ABNORMAL LOW
Glucose, Bld: 128 mg/dL — ABNORMAL HIGH (ref 70–99)
Potassium: 3.4 mmol/L — ABNORMAL LOW (ref 3.5–5.1)
Sodium: 136 mmol/L (ref 135–145)
Total Bilirubin: 1.3 mg/dL — ABNORMAL HIGH (ref 0.3–1.2)
Total Protein: 6.7 g/dL (ref 6.5–8.1)

## 2019-03-24 LAB — URINALYSIS, ROUTINE W REFLEX MICROSCOPIC
Bilirubin Urine: NEGATIVE
Glucose, UA: NEGATIVE mg/dL
Hgb urine dipstick: NEGATIVE
Ketones, ur: NEGATIVE mg/dL
Leukocytes,Ua: NEGATIVE
Nitrite: NEGATIVE
Protein, ur: NEGATIVE mg/dL
Specific Gravity, Urine: 1.02 (ref 1.005–1.030)
pH: 5 (ref 5.0–8.0)

## 2019-03-24 LAB — CBC WITH DIFFERENTIAL/PLATELET
Abs Immature Granulocytes: 0.05 10*3/uL (ref 0.00–0.07)
Basophils Absolute: 0 10*3/uL (ref 0.0–0.1)
Basophils Relative: 0 %
Eosinophils Absolute: 0 10*3/uL (ref 0.0–0.5)
Eosinophils Relative: 0 %
HCT: 50.6 % (ref 39.0–52.0)
Hemoglobin: 16 g/dL (ref 13.0–17.0)
Immature Granulocytes: 1 %
Lymphocytes Relative: 13 %
Lymphs Abs: 1.3 10*3/uL (ref 0.7–4.0)
MCH: 29.9 pg (ref 26.0–34.0)
MCHC: 31.6 g/dL (ref 30.0–36.0)
MCV: 94.4 fL (ref 80.0–100.0)
Monocytes Absolute: 0.9 10*3/uL (ref 0.1–1.0)
Monocytes Relative: 9 %
Neutro Abs: 7.5 10*3/uL (ref 1.7–7.7)
Neutrophils Relative %: 77 %
Platelets: 242 10*3/uL (ref 150–400)
RBC: 5.36 MIL/uL (ref 4.22–5.81)
RDW: 15.3 % (ref 11.5–15.5)
WBC: 9.7 10*3/uL (ref 4.0–10.5)
nRBC: 0 % (ref 0.0–0.2)

## 2019-03-24 LAB — LACTIC ACID, PLASMA
Lactic Acid, Venous: 1.6 mmol/L (ref 0.5–1.9)
Lactic Acid, Venous: 1.1 mmol/L (ref 0.5–1.9)

## 2019-03-24 LAB — D-DIMER, QUANTITATIVE: D-Dimer, Quant: 0.51 ug{FEU}/mL — ABNORMAL HIGH (ref 0.00–0.50)

## 2019-03-24 LAB — TRIGLYCERIDES: Triglycerides: 256 mg/dL — ABNORMAL HIGH (ref <150)

## 2019-03-24 LAB — TSH: TSH: 0.954 u[IU]/mL (ref 0.350–4.500)

## 2019-03-24 LAB — LACTATE DEHYDROGENASE: LDH: 153 U/L (ref 98–192)

## 2019-03-24 LAB — FIBRINOGEN: Fibrinogen: 442 mg/dL (ref 210–475)

## 2019-03-24 LAB — C-REACTIVE PROTEIN: CRP: <0.8 mg/dL (ref <1.0)

## 2019-03-24 LAB — FERRITIN: Ferritin: 122 ng/mL (ref 24–336)

## 2019-03-24 LAB — PROCALCITONIN: Procalcitonin: <0.1 ng/mL

## 2019-03-24 MED ORDER — ACETAMINOPHEN 325 MG PO TABS: 650.0000 mg | ORAL_TABLET | Freq: Once | ORAL | Status: DC

## 2019-03-24 MED ORDER — ALBUTEROL SULFATE HFA 108 (90 BASE) MCG/ACT IN AERS
6.0000 | INHALATION_SPRAY | Freq: Once | RESPIRATORY_TRACT | Status: AC
  Administered 2019-03-24: 6 via RESPIRATORY_TRACT
  Filled 2019-03-24: qty 6.7

## 2019-03-24 NOTE — Discharge Instructions (Addendum)
  All the results in the ER are normal, labs and imaging. We are not sure what is causing your symptoms. The workup in the ER is not complete, and is limited to screening for life threatening and emergent conditions only, so please see a primary care doctor for further evaluation.  Please return to the ER if your symptoms worsen; you have increased pain, fevers, chills, inability to keep any medications down, confusion. Otherwise see the outpatient doctor as requested.  

## 2019-03-24 NOTE — ED Triage Notes (Signed)
Pt in from home via [facility], c/o weakness x 1 wk, fever last night. Also reports feeling so weak PTA that he laid himself to the floor (did not fall/no LOC). Hx of DM and memory loss. States he went for heart cath on Friday, no interventions done. Fever of 103 last night, he thinks. Denies any cough, cp or sob. Sats 92% on RA per EMS, arrives on [facility]

## 2019-03-24 NOTE — ED Provider Notes (Addendum)
[facility] EMERGENCY DEPARTMENT Provider Note   CSN: [id number] Arrival date & time: 03/24/19  1754     History   Chief Complaint Chief Complaint  Patient presents with  . Weakness  . Fever    HPI Jeremiah [name] is a 78 y.o. male.     HPI Patient comes into the ER with chief complaint of fever and weakness.  Patient has history of CRAO, diabetes comes into the ER with fatigue and weakness.  He reports that he has been feeling sick for the last several weeks now.  He has been seeing his PCP and they recently did a cardiac work-up that revealed no congestive heart failure or obstructive CAD.  Patient also had a Covid test which was negative in the last 2 weeks.  He reports that he continues to have significant weakness and decided to come to the ED for further evaluation.  I called patient's wife [phone] and [phone] and daughter [name] at [phone] without any response.  Patient alleges that he just tested positive for Covid and that he was tested at [facility], however we do not have a recent positive test.  He has subjective fevers, no new cough, chest pain, nausea, vomiting, diarrhea.  Past Medical History:  Diagnosis Date  . BPH (benign prostatic hyperplasia)   . Central retinal artery occlusion   . Diabetes mellitus without complication (HCC)    diet controlled  . Diverticulosis   . History of kidney stones   . Hyperlipidemia   . Hypertension   . OSA on CPAP    wears cpap  . Osteoarthritis   . 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 S/P radiation therapy 04/23/13 - 05/29/14   Prostate/seminal vesicles, external beam 4500 cGy in 25 sessions  . Seasonal allergies   . Skin cancer    scalp  . Wears glasses     Patient Active Problem List   Diagnosis Date Noted  . Other fatigue   . 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 Small bowel obstruction ([place]) 06/10/2016  . HLD (hyperlipidemia) 06/10/2016  . HTN  (hypertension) 06/10/2016  . Acute kidney injury ([place]) 06/10/2016  . Dehydration, moderate 06/10/2016  . AAA (abdominal aortic aneurysm) ([place]) 06/10/2016  . OSA on CPAP 06/10/2016  . Malignant neoplasm of prostate s/p radiation 2015 03/10/2014    Past Surgical History:  Procedure Laterality Date  . BACK SURGERY  2009  . COLONOSCOPY W/ BIOPSIES AND POLYPECTOMY    . [place]  . LEFT HEART CATH AND CORONARY ANGIOGRAPHY N/A 03/14/2019   Procedure: LEFT HEART CATH AND CORONARY ANGIOGRAPHY;  Surgeon: [name], MD;  Location: [facility];  Service: Cardiovascular;  Laterality: N/A;  . PROSTATE BIOPSY  2013, 2014, 01/2014   Gleason 7  . RADIOACTIVE SEED IMPLANT N/A 07/01/2014   Procedure: RADIOACTIVE SEED IMPLANT;  Surgeon: [name], MD;  Location: [facility];  Service: Urology;  Laterality: N/A;  DR PORTABLE  . TONSILLECTOMY    . 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   Procedure: SHUNT INSERTION VENTRICULAR-PERITONEAL;  Surgeon: [name], MD;  Location: [place];  Service: Neurosurgery;  Laterality: N/A;  SHUNT INSERTION VENTRICULAR-PERITONEAL        Home Medications    Prior to Admission medications   Medication Sig Start Date End Date Taking? Authorizing Provider  acetaminophen (TYLENOL) 500 MG tablet Take 1,000 mg by mouth at bedtime.  [provider]  Apoaequorin (PREVAGEN EXTRA STRENGTH) 20 MG CAPS Take 20 mg by mouth daily.    [provider]  aspirin EC 81 MG tablet Take 81 mg by mouth daily.     [provider]  hydrochlorothiazide (MICROZIDE) 12.5 MG capsule Take 12.5 mg by mouth daily.  07/14/15   [provider]  Melatonin 10 MG CAPS Take 10 mg by mouth at bedtime.    [provider]  Multiple Vitamin (MULTIVITAMIN WITH MINERALS) TABS tablet Take 1 tablet by mouth daily.    [provider]  Multiple Vitamins-Minerals (PRESERVISION AREDS 2 PO) Take 1 tablet  by mouth 2 (two) times daily.     [provider]  pantoprazole (PROTONIX) 20 MG tablet Take 20 mg by mouth every other day. 02/19/19   [provider]  potassium chloride (K-DUR) 10 MEQ tablet Take 20 mEq by mouth daily.  06/16/15   [provider]  senna-docusate (SENOKOT-S) 8.6-50 MG tablet Take 1 tablet by mouth daily.    [provider]  vitamin B-12 (CYANOCOBALAMIN) 250 MCG tablet Take 250 mcg by mouth daily.    [provider]    Family History Family History  Problem Relation Age of Onset  . Heart attack Father 74  . Cancer Father        prostate  . Diabetes Father   . Cancer Paternal Uncle        prostate  . Dementia Mother 87  . Lung cancer Brother     Social History Social History   Tobacco Use  . Smoking status: Former Smoker    Types: Cigarettes    Quit date: 03/10/1961    Years since quitting: 58.0  . Smokeless tobacco: Never Used  Substance Use Topics  . Alcohol use: Yes    Alcohol/week: 1.0 standard drinks    Types: 1 Glasses of wine per week    Comment: rare alcohol  . Drug use: No     Allergies   Patient has no known allergies.   Review of Systems Review of Systems  Constitutional: Positive for activity change, fatigue and fever. Negative for chills.  Respiratory: Positive for shortness of breath. Negative for cough.   Cardiovascular: Negative for chest pain.  Gastrointestinal: Negative for nausea and vomiting.  Neurological: Negative for dizziness.  All other systems reviewed and are negative.    Physical Exam Updated Vital Signs BP 132/88   Pulse 89   Temp 99.8 F (37.7 C) (Oral)   Resp (!) 26   Ht 5\' 10"  (1.778 m)   Wt 80.7 kg   SpO2 90%   BMI 25.53 kg/m   Physical Exam Vitals signs and nursing note reviewed.  Constitutional:      Appearance: He is well-developed.  HENT:     Head: Atraumatic.  Eyes:     Extraocular Movements: Extraocular movements intact.     Pupils: Pupils are  equal, round, and reactive to light.  Neck:     Musculoskeletal: Neck supple.  Cardiovascular:     Rate and Rhythm: Normal rate.  Pulmonary:     Effort: Pulmonary effort is normal.     Breath sounds: No wheezing.  Abdominal:     Tenderness: There is no abdominal tenderness.  Skin:    General: Skin is warm.  Neurological:     Mental Status: He is alert and oriented to person, place, and time.      ED Treatments / Results  Labs (all labs  ordered are listed, but only abnormal results are displayed) Labs Reviewed  COMPREHENSIVE METABOLIC PANEL - Abnormal; Notable for the following components:      Result Value   Potassium 3.4 (*)    Glucose, Bld 128 (*)    Total Bilirubin 1.3 (*)    GFR calc non Af Amer 59 (*)    All other components within normal limits  D-DIMER, QUANTITATIVE (NOT AT [facility]) - Abnormal; Notable for the following components:   D-Dimer, Quant 0.51 (*)    All other components within normal limits  TRIGLYCERIDES - Abnormal; Notable for the following components:   Triglycerides 256 (*)    All other components within normal limits  URINE CULTURE  LACTIC ACID, PLASMA  LACTIC ACID, PLASMA  CBC WITH DIFFERENTIAL/PLATELET  PROCALCITONIN  LACTATE DEHYDROGENASE  FIBRINOGEN  C-REACTIVE PROTEIN  FERRITIN  TSH  URINALYSIS, ROUTINE W REFLEX MICROSCOPIC    EKG EKG Interpretation  Date/Time:  Monday March 24 2019 18:01:51 EST Ventricular Rate:  102 PR Interval:    QRS Duration: 111 QT Interval:  352 QTC Calculation: 461 R Axis:   59 Text Interpretation: Sinus tachycardia Probable left atrial enlargement Low voltage, precordial leads Borderline T abnormalities, anterior leads No acute changes No significant change since last tracing Confirmed by Varney Biles ([id number]) on 03/24/2019 9:37:52 PM   Radiology Dg Chest Port 1 View  Result Date: 03/24/2019 CLINICAL DATA:  Weakness and fever EXAM: PORTABLE CHEST 1 VIEW COMPARISON:  06/13/2018 FINDINGS: Elevated  right diaphragm. Right-sided shunt tubing. No consolidation or effusion. Normal heart size. Aortic atherosclerosis. No pneumothorax. IMPRESSION: No active disease.  Elevation of the right diaphragm as before Electronically Signed   By: Donavan Foil M.D.   On: 03/24/2019 23:00    Procedures Procedures (including critical care time)  Medications Ordered in ED Medications  acetaminophen (TYLENOL) tablet 1,000 mg (has no administration in time range)  aspirin EC tablet 81 mg (has no administration in time range)  hydrochlorothiazide (MICROZIDE) capsule 12.5 mg (has no administration in time range)  Melatonin TABS 9 mg (has no administration in time range)  pantoprazole (PROTONIX) EC tablet 20 mg (has no administration in time range)  potassium chloride SA (KLOR-CON) CR tablet 20 mEq (has no administration in time range)  albuterol (VENTOLIN HFA) 108 (90 Base) MCG/ACT inhaler 6 puff (6 puffs Inhalation Given 03/24/19 2032)     Initial Impression / Assessment and Plan / ED Course  I have reviewed the triage vital signs and the nursing notes.  Pertinent labs & imaging results that were available during my care of the patient were reviewed by me and considered in my medical decision making (see chart for details).  Clinical Course as of Mar 24 113  Tue Mar 25, 2019  0104 Patient spoke with patient's son-in-law.  Patient had a fall earlier today.  Family is not comfortable with him coming home.  Patient also agrees that perhaps it might be best for him to stay in the ER and get assessed by physical therapy, social work, case management.  If he needs to be heading to a SNF/rehab he will be amenable.  In the interim, his O2 sats were noted to be 90%. Age-adjusted D-dimer is negative therefore we did not proceed with CT PE.  I still think he has PE as the underlying cause.  Plan is to get repeat vitals every 6 hours and have the patient reassessed in the morning by PT, social work.  If patient is  noted  to be getting hypoxic then he should get a CT PE.     [AN]    Clinical Course User Index [AN] Varney Biles, MD       78 year old comes in a chief complaint of weakness.  He reports that he has been feeling weak for the last several weeks.  He has been having significant fatigue, and is exertional capacity is come down.  He has been seeing his PCP for it and he had a recent cath which was negative.  He is also been tested for COVID-19, and according to him he tested positive, however according to our records he had tested negative few days ago.  I tried calling patient's daughter and wife to get some clarification, however they did not respond.  In the interim, patient has no chest pain, headaches, neck pain, UTI-like symptoms, new abdominal pain.  He denies any new pain medications or psychiatric medications.  I am not quite clear what the underlying processes.  We did get basic labs and inflammatory markers, and all of those lab results are reassuring.  At this time I do not think any further work-up is needed.  We will check his urine to ensure it does not show any signs of infection.  Vital signs are stable and within normal limits.  Patient has 0 sirs criteria.  Lactate is normal which is also reassuring.  Anticipate discharge.  Final Clinical Impressions(s) / ED Diagnoses   Final diagnoses:  Generalized weakness    ED Discharge Orders    None       Varney Biles, MD 03/24/19 2324    Varney Biles, MD 03/25/19 6578

## 2019-03-25 ENCOUNTER — Emergency Department (HOSPITAL_COMMUNITY): Payer: Medicare Other

## 2019-03-25 DIAGNOSIS — R4182 Altered mental status, unspecified: Secondary | ICD-10-CM | POA: Diagnosis not present

## 2019-03-25 DIAGNOSIS — R5383 Other fatigue: Secondary | ICD-10-CM | POA: Diagnosis not present

## 2019-03-25 LAB — URINE CULTURE: Culture: NO GROWTH

## 2019-03-25 MED ORDER — ASPIRIN EC 81 MG PO TBEC
81.0000 mg | DELAYED_RELEASE_TABLET | Freq: Every day | ORAL | Status: DC
  Administered 2019-03-25: 81 mg via ORAL
  Filled 2019-03-25: qty 1

## 2019-03-25 MED ORDER — HYDROCHLOROTHIAZIDE 12.5 MG PO CAPS
12.5000 mg | ORAL_CAPSULE | Freq: Every day | ORAL | Status: DC
  Administered 2019-03-25: 12.5 mg via ORAL
  Filled 2019-03-25: qty 1

## 2019-03-25 MED ORDER — MELATONIN 3 MG PO TABS
9.0000 mg | ORAL_TABLET | Freq: Every day | ORAL | Status: DC
  Administered 2019-03-25: 9 mg via ORAL
  Filled 2019-03-25 (×2): qty 3

## 2019-03-25 MED ORDER — POTASSIUM CHLORIDE ER 10 MEQ PO TBCR
20.0000 meq | EXTENDED_RELEASE_TABLET | Freq: Every day | ORAL | Status: DC
  Filled 2019-03-25: qty 2

## 2019-03-25 MED ORDER — POTASSIUM CHLORIDE CRYS ER 20 MEQ PO TBCR
20.0000 meq | EXTENDED_RELEASE_TABLET | Freq: Every day | ORAL | Status: DC
  Administered 2019-03-25: 20 meq via ORAL
  Filled 2019-03-25 (×2): qty 1

## 2019-03-25 MED ORDER — ACETAMINOPHEN 500 MG PO TABS: 1000.0000 mg | ORAL_TABLET | Freq: Every day | ORAL | Status: DC

## 2019-03-25 MED ORDER — PANTOPRAZOLE SODIUM 20 MG PO TBEC
20.0000 mg | DELAYED_RELEASE_TABLET | ORAL | Status: DC
  Administered 2019-03-25: 20 mg via ORAL
  Filled 2019-03-25: qty 1

## 2019-03-25 NOTE — Evaluation (Signed)
Physical Therapy Evaluation Patient Details Name: Jeremiah [name] MRN: [id number] DOB: 04/22/1941 Today's Date: 03/25/2019   History of Present Illness  Patient is a 78 y/o male with PMH NPH w/VP shunt, prostate CA, BPH, central retinal artery occlusion, DM, HTN, HLD, arthritis.  He presented to ED with fatigue, weakness and recent fall.  Clinical Impression  Patient presents with decreased balance, decreased gait and coordination limiting independence and safety with mobility.  Currently needs overall min A for safety with mobility with RW and demonstrates LOB with turns, static standing feet together and with sitting LE strength and coordination testing.  Daughter present and reports his wife is frail and unable to provide assist.  Feel unless daughter or capable assistance available at home pt may need STSNF level rehab.  PT to follow acutely until d/c.     Follow Up Recommendations Home health PT;Supervision/Assistance - 24 hour;SNF(If has capable 24h assist could go home with HHPT/3:1, otherwise may need STSNF)    Equipment Recommendations  3in1 (PT)    Recommendations for Other Services       Precautions / Restrictions Precautions Precautions: Fall      Mobility  Bed Mobility Overal bed mobility: Modified Independent             General bed mobility comments: sit to supine unaided  Transfers Overall transfer level: Needs assistance   Transfers: Sit to/from Stand Sit to Stand: Supervision;Min assist         General transfer comment: assist for safety, at timed leaning posteriorly with min A needed  Ambulation/Gait Ambulation/Gait assistance: Supervision;Min guard Gait Distance (Feet): 100 Feet Assistive device: Rolling walker (2 wheeled) Gait Pattern/deviations: Step-to pattern;Step-through pattern;Shuffle;Decreased stride length     General Gait Details: anterior bias with shuffling steps high fall risk for anterior LOB  Stairs            Wheelchair  Mobility    Modified Rankin (Stroke Patients Only)       Balance Overall balance assessment: Needs assistance   Sitting balance-Leahy Scale: Fair Sitting balance - Comments: sitting edge of bed with S, but with strength testing LE's leaning R and posterior min A to mod A for balance Postural control: Posterior lean;Right lateral lean   Standing balance-Leahy Scale: Fair Standing balance comment: static standing x 2 minutes eyes open with S; standing eyes closed 10 sec with S, turning in circle needed assist due to posterior LOB, standing looking over shoulders with S, feet together S x 30 sec then LOB posterior sat on EOB, able to reach forward about 5" with S no LOB                             Pertinent Vitals/Pain Pain Assessment: No/denies pain    Home Living Family/patient expects to be discharged to:: Private residence Living Arrangements: Spouse/significant other Available Help at Discharge: Family Type of Home: House Home Access: Stairs to enter   Technical brewer of Steps: 2 Home Layout: Laundry or work area in basement;Two level;Able to live on main level with bedroom/bathroom Home Equipment: Gilford Rile - 2 wheels;Cane - single point;Shower seat - built in      Prior Function Level of Independence: Independent         Comments: was not using device till weakness then using cane     Hand Dominance   Dominant Hand: Right    Extremity/Trunk Assessment   Upper Extremity Assessment Upper Extremity Assessment:  LUE deficits/detail;RUE deficits/detail RUE Deficits / Details: decreased coordination with pronation/supination RUE Coordination: decreased fine motor LUE Deficits / Details: decreased coordination with FNF and pronation/supination LUE Coordination: decreased fine motor    Lower Extremity Assessment Lower Extremity Assessment: LLE deficits/detail;RLE deficits/detail RLE Deficits / Details: AROM/strength WFL LLE Deficits / Details:  AROM/strength WFL, decreased coordination wtih toe tapping as compared to R       Communication   Communication: No difficulties  Cognition Arousal/Alertness: Awake/alert Behavior During Therapy: WFL for tasks assessed/performed Overall Cognitive Status: History of cognitive impairments - at baseline                                 General Comments: reports sometimes his brain tells him he can do things he can't      General Comments General comments (skin integrity, edema, etc.): Daughter present and discussed options for d/c and concern for fall risk with pt and daughter.  SpO2 monitored during ambulation with SpO2 measured 93% on RA throughout.    Exercises     Assessment/Plan    PT Assessment Patient needs continued PT services  PT Problem List Decreased activity tolerance;Decreased balance;Decreased coordination;Decreased mobility;Decreased safety awareness;Decreased knowledge of precautions       PT Treatment Interventions Gait training;Functional mobility training;Therapeutic exercise;Patient/family education;Balance training;Therapeutic activities;Stair training;DME instruction    PT Goals (Current goals can be found in the Care Plan section)  Acute Rehab PT Goals Patient Stated Goal: to get head checked out for possible shunt adjustment PT Goal Formulation: With patient/family Time For Goal Achievement: 04/08/19 Potential to Achieve Goals: Good    Frequency Min 3X/week   Barriers to discharge Decreased caregiver support daughter reports wife is frail and unable to provide physical assist    Co-evaluation               AM-PAC PT "6 Clicks" Mobility  Outcome Measure Help needed turning from your back to your side while in a flat bed without using bedrails?: None Help needed moving from lying on your back to sitting on the side of a flat bed without using bedrails?: None Help needed moving to and from a bed to a chair (including a  wheelchair)?: A Little Help needed standing up from a chair using your arms (e.g., wheelchair or bedside chair)?: A Little Help needed to walk in hospital room?: A Little Help needed climbing 3-5 steps with a railing? : A Little 6 Click Score: 20    End of Session   Activity Tolerance: Patient limited by fatigue Patient left: in bed;with family/visitor present Nurse Communication: Mobility status PT Visit Diagnosis: Other abnormalities of gait and mobility (R26.89);Other symptoms and signs involving the nervous system (R29.898)    Time: 5638-7564 PT Time Calculation (min) (ACUTE ONLY): 25 min   Charges:   PT Evaluation $PT Eval Moderate Complexity: 1 Mod PT Treatments $Gait Training: 8-22 mins        Magda Kiel, [facility] [phone] 03/25/2019   Reginia Naas 03/25/2019, 10:41 AM

## 2019-03-25 NOTE — ED Notes (Addendum)
Pt attempted to walk, but referred weakness, imbalance, even with a walker. While trying to stand patient lacked the strength to sit up or start movement. Family bedside stated: "he has been like the past four weeks"

## 2019-03-25 NOTE — ED Notes (Signed)
Pt taken to restroom via wheelchair.

## 2019-03-25 NOTE — TOC Transition Note (Signed)
Transition of Care [facility]) - CM/SW Discharge Note   Patient Details  Name: JASHAUN PENROSE MRN: [id number] Date of Birth: 1940/10/21  [facility] CM/SW Contact:  Fuller Mandril, RN Phone Number: 03/25/2019, 11:20 AM   Clinical Narrative:    [facility] met with pt and daughter at bedside to confirm disposition plan of home with home health.  Pt and daughter chose [facility] services and [employer] for DME supplies.  DME 3n1 ordered and will be delivered to pt at bedside prior to discharge today.    Final next level of care: [place] Barriers to Discharge: Barriers Resolved   Patient Goals and CMS Choice Patient states their goals for this hospitalization and ongoing recovery are:: "to get back to wher I was" CMS Medicare.gov Compare Post Acute Care list provided to:: Patient Represenative (must comment)([place]) Choice offered to / list presented to : Patient, Adult Children  Discharge Placement                       Discharge Plan and Services In-house Referral: Clinical Social Work Discharge Planning Services: CM Consult            DME Arranged: 3-N-1 DME Agency: [employer] Date DME Agency Contacted: 03/25/19 Time DME Agency Contacted: 1119 Representative spoke with at DME Agency: [place]: RN, PT, Social Work [employer] Agency: [facility] [employer] [facility] [employer] Date [place]: 03/25/19 Time [place]: 50 Representative spoke with at [place]: [place] ([place]) Interventions     Readmission Risk Interventions No flowsheet data found.

## 2019-03-25 NOTE — TOC Initial Note (Signed)
Transition of Care [facility]) - Initial/Assessment Note    Patient Details  Name: Jeremiah [name] MRN: [id number] Date of Birth: 05-15-1941  [facility]) CM/SW Contact:    Fuller Mandril, RN Phone Number: 03/25/2019, 9:17 AM  Clinical Narrative:                 [facility] consulted to discuss disposition options with pt and family.  Pt and family would like PT evaluation to aid in determining best option and are hopeful for Home with Home Health.  EDCM provided list of [place] options to pt and adult child Ivin Booty).  Will update chart with pt wishes.   Expected Discharge Plan: [place] Barriers to Discharge: Continued Medical Work up   Patient Goals and CMS Choice Patient states their goals for this hospitalization and ongoing recovery are:: "to get back to wher I was" CMS Medicare.gov Compare Post Acute Care list provided to:: Patient Represenative (must comment)([place]) Choice offered to / list presented to : Patient, Adult Children  Expected Discharge Plan and Services Expected Discharge Plan: [place] In-house Referral: Clinical Social Work Discharge Planning Services: CM Consult   Living arrangements for the past 2 months: [place] Expected Discharge Date: 03/25/19                                    Prior Living Arrangements/Services Living arrangements for the past 2 months: Single Family Home Lives with:: Significant Other Patient language and need for interpreter reviewed:: Yes        Need for Family Participation in Patient Care: Yes (Comment) Care giver support system in place?: Yes (comment)   Criminal Activity/Legal Involvement Pertinent to Current Situation/Hospitalization: No - Comment as needed  Activities of Daily Living      Permission Sought/Granted                  Emotional Assessment Appearance:: Appears stated age Attitude/Demeanor/Rapport: Engaged Affect (typically observed): Accepting,  Appropriate Orientation: : Oriented to Self, Oriented to Place, Oriented to  Time, Oriented to Situation Alcohol / Substance Use: Not Applicable Psych Involvement: No (comment)  Admission diagnosis:  Weakness, Fever Patient Active Problem List   Diagnosis Date Noted  . Other fatigue   . 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 Small bowel obstruction ([place]) 06/10/2016  . HLD (hyperlipidemia) 06/10/2016  . HTN (hypertension) 06/10/2016  . Acute kidney injury ([place]) 06/10/2016  . Dehydration, moderate 06/10/2016  . AAA (abdominal aortic aneurysm) ([place]) 06/10/2016  . OSA on CPAP 06/10/2016  . Malignant neoplasm of prostate s/p radiation 2015 03/10/2014   PCP:  [name], MD Pharmacy:   [facility], [address] [place] [address] Phone: [phone] Fax: [phone]     Social Determinants of Health (SDOH) Interventions    Readmission Risk Interventions No flowsheet data found.

## 2019-03-25 NOTE — ED Notes (Signed)
Patient verbalizes understanding of discharge instructions. Opportunity for questioning and answers were provided. Armband removed by staff, pt discharged from ED in wheelchair to home.   

## 2019-03-27 ENCOUNTER — Emergency Department (HOSPITAL_COMMUNITY): Payer: Medicare Other

## 2019-03-27 ENCOUNTER — Other Ambulatory Visit: Payer: Self-pay

## 2019-03-27 ENCOUNTER — Emergency Department (HOSPITAL_COMMUNITY)
Admission: EM | Admit: 2019-03-27 | Discharge: 2019-03-28 | Disposition: A | Discharge status: Home / Self Care | Payer: Medicare Other | Attending: Emergency Medicine | Admitting: Emergency Medicine

## 2019-03-27 DIAGNOSIS — Z7982 Long term (current) use of aspirin: Secondary | ICD-10-CM | POA: Insufficient documentation

## 2019-03-27 DIAGNOSIS — R000 Tachycardia, unspecified: Secondary | ICD-10-CM | POA: Diagnosis not present

## 2019-03-27 DIAGNOSIS — J9811 Atelectasis: Secondary | ICD-10-CM | POA: Diagnosis not present

## 2019-03-27 DIAGNOSIS — Z20828 Contact with and (suspected) exposure to other viral communicable diseases: Secondary | ICD-10-CM | POA: Insufficient documentation

## 2019-03-27 DIAGNOSIS — K5792 Diverticulitis of intestine, part unspecified, without perforation or abscess without bleeding: Secondary | ICD-10-CM | POA: Insufficient documentation

## 2019-03-27 DIAGNOSIS — Z85828 Personal history of other malignant neoplasm of skin: Secondary | ICD-10-CM | POA: Diagnosis not present

## 2019-03-27 DIAGNOSIS — Z79899 Other long term (current) drug therapy: Secondary | ICD-10-CM | POA: Diagnosis not present

## 2019-03-27 DIAGNOSIS — E119 Type 2 diabetes mellitus without complications: Secondary | ICD-10-CM | POA: Insufficient documentation

## 2019-03-27 DIAGNOSIS — I10 Essential (primary) hypertension: Secondary | ICD-10-CM | POA: Insufficient documentation

## 2019-03-27 DIAGNOSIS — R109 Unspecified abdominal pain: Secondary | ICD-10-CM | POA: Diagnosis not present

## 2019-03-27 DIAGNOSIS — Z8546 Personal history of malignant neoplasm of prostate: Secondary | ICD-10-CM | POA: Diagnosis not present

## 2019-03-27 DIAGNOSIS — Z87891 Personal history of nicotine dependence: Secondary | ICD-10-CM | POA: Diagnosis not present

## 2019-03-27 DIAGNOSIS — R509 Fever, unspecified: Secondary | ICD-10-CM | POA: Insufficient documentation

## 2019-03-27 DIAGNOSIS — R10813 Right lower quadrant abdominal tenderness: Secondary | ICD-10-CM | POA: Diagnosis not present

## 2019-03-27 DIAGNOSIS — R531 Weakness: Secondary | ICD-10-CM | POA: Diagnosis not present

## 2019-03-27 DIAGNOSIS — I2510 Atherosclerotic heart disease of native coronary artery without angina pectoris: Secondary | ICD-10-CM | POA: Diagnosis not present

## 2019-03-27 DIAGNOSIS — E1151 Type 2 diabetes mellitus with diabetic peripheral angiopathy without gangrene: Secondary | ICD-10-CM | POA: Diagnosis not present

## 2019-03-27 DIAGNOSIS — K5732 Diverticulitis of large intestine without perforation or abscess without bleeding: Secondary | ICD-10-CM | POA: Diagnosis not present

## 2019-03-27 LAB — CBC WITH DIFFERENTIAL/PLATELET
Abs Immature Granulocytes: 0.04 10*3/uL (ref 0.00–0.07)
Basophils Absolute: 0 10*3/uL (ref 0.0–0.1)
Basophils Relative: 0 %
Eosinophils Absolute: 0.1 10*3/uL (ref 0.0–0.5)
Eosinophils Relative: 1 %
HCT: 47.6 % (ref 39.0–52.0)
Hemoglobin: 15 g/dL (ref 13.0–17.0)
Immature Granulocytes: 0 %
Lymphocytes Relative: 9 %
Lymphs Abs: 1.2 10*3/uL (ref 0.7–4.0)
MCH: 29.8 pg (ref 26.0–34.0)
MCHC: 31.5 g/dL (ref 30.0–36.0)
MCV: 94.4 fL (ref 80.0–100.0)
Monocytes Absolute: 1.3 10*3/uL — ABNORMAL HIGH (ref 0.1–1.0)
Monocytes Relative: 11 %
Neutro Abs: 9.8 10*3/uL — ABNORMAL HIGH (ref 1.7–7.7)
Neutrophils Relative %: 79 %
Platelets: 202 10*3/uL (ref 150–400)
RBC: 5.04 MIL/uL (ref 4.22–5.81)
RDW: 15.4 % (ref 11.5–15.5)
WBC: 12.4 10*3/uL — ABNORMAL HIGH (ref 4.0–10.5)
nRBC: 0 % (ref 0.0–0.2)

## 2019-03-27 LAB — COMPREHENSIVE METABOLIC PANEL
ALT: 15 U/L (ref 0–44)
AST: 30 U/L (ref 15–41)
Albumin: 3.6 g/dL (ref 3.5–5.0)
Alkaline Phosphatase: 51 U/L (ref 38–126)
Anion gap: 12 (ref 5–15)
BUN: 25 mg/dL — ABNORMAL HIGH (ref 8–23)
CO2: 27 mmol/L (ref 22–32)
Calcium: 9.2 mg/dL (ref 8.9–10.3)
Chloride: 99 mmol/L (ref 98–111)
Creatinine, Ser: 1.44 mg/dL — ABNORMAL HIGH (ref 0.61–1.24)
GFR calc Af Amer: 53 mL/min — ABNORMAL LOW (ref >60)
GFR calc non Af Amer: 46 mL/min — ABNORMAL LOW (ref >60)
Glucose, Bld: 148 mg/dL — ABNORMAL HIGH (ref 70–99)
Potassium: 3.3 mmol/L — ABNORMAL LOW (ref 3.5–5.1)
Sodium: 138 mmol/L (ref 135–145)
Total Bilirubin: 0.9 mg/dL (ref 0.3–1.2)
Total Protein: 6.8 g/dL (ref 6.5–8.1)

## 2019-03-27 LAB — PROTIME-INR
INR: 1 (ref 0.8–1.2)
Prothrombin Time: 13.4 seconds (ref 11.4–15.2)

## 2019-03-27 LAB — LACTIC ACID, PLASMA: Lactic Acid, Venous: 1.8 mmol/L (ref 0.5–1.9)

## 2019-03-27 NOTE — ED Triage Notes (Signed)
Pt to ED coming from home with daughter, Pt c/o weakness, fatigue over the past week. Febrile.

## 2019-03-27 NOTE — ED Notes (Signed)
Jeremiah [name] (Son/POC# [phone]). Also will be waiting outside in his car for updates.

## 2019-03-28 ENCOUNTER — Encounter (HOSPITAL_COMMUNITY): Payer: Self-pay | Admitting: Radiology

## 2019-03-28 ENCOUNTER — Emergency Department (HOSPITAL_COMMUNITY): Payer: Medicare Other

## 2019-03-28 DIAGNOSIS — R109 Unspecified abdominal pain: Secondary | ICD-10-CM | POA: Diagnosis not present

## 2019-03-28 LAB — URINALYSIS, ROUTINE W REFLEX MICROSCOPIC
Bilirubin Urine: NEGATIVE
Glucose, UA: NEGATIVE mg/dL
Hgb urine dipstick: NEGATIVE
Ketones, ur: NEGATIVE mg/dL
Leukocytes,Ua: NEGATIVE
Nitrite: NEGATIVE
Protein, ur: 100 mg/dL — AB
Specific Gravity, Urine: 1.027 (ref 1.005–1.030)
pH: 5 (ref 5.0–8.0)

## 2019-03-28 MED ORDER — AMOXICILLIN-POT CLAVULANATE 875-125 MG PO TABS
1.0000 | ORAL_TABLET | Freq: Once | ORAL | Status: AC
  Administered 2019-03-28: 1 via ORAL
  Filled 2019-03-28: qty 1

## 2019-03-28 MED ORDER — SODIUM CHLORIDE 0.9 % IV BOLUS
1000.0000 mL | Freq: Once | INTRAVENOUS | Status: AC
  Administered 2019-03-28: 1000 mL via INTRAVENOUS

## 2019-03-28 MED ORDER — AMOXICILLIN-POT CLAVULANATE 875-125 MG PO TABS: 1.0000 | ORAL_TABLET | Freq: Two times a day (BID) | ORAL | 0 refills | Status: AC

## 2019-03-28 MED ORDER — IOHEXOL 300 MG/ML  SOLN
80.0000 mL | Freq: Once | INTRAMUSCULAR | Status: AC | PRN
  Administered 2019-03-28: 80 mL via INTRAVENOUS

## 2019-03-28 NOTE — Progress Notes (Signed)
Cardiology Office Note   Date:  04/02/2019   ID:  Jeremiah [name], DOB 10-Dec-1940, MRN [id number]  PCP:  [name], MD  Cardiologist:    [name], MD   Chief Complaint  Patient presents with  . Fatigue      History of Present Illness: Jeremiah [name] is a 78 y.o. male who was seen recently at the request of Dr. Joylene Draft for evaluation of periods of exhaustion. He was seen one year ago with some symptoms of fatigue and dyspnea. He has a history of HTN, HLD, and OSA on CPAP.  He also has a history of DM diet controlled and family history of CAD. We performed a Myoview last year and it was normal.  More recently he noted some symptoms of marked exhaustion.    No chest pain. 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 Because of these recent symptoms he did undergoo a  cardiac cath which showed nonobstructive CAD. LV function normal.  On follow up he is doing OK. A little tender at cath site. No chest pain.     Past Medical History:  Diagnosis Date  . BPH (benign prostatic hyperplasia)   . Central retinal artery occlusion   . Diabetes mellitus without complication (HCC)    diet controlled  . Diverticulosis   . History of kidney stones   . Hyperlipidemia   . Hypertension   . OSA on CPAP    wears cpap  . Osteoarthritis   . 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 S/P radiation therapy 04/23/13 - 05/29/14   Prostate/seminal vesicles, external beam 4500 cGy in 25 sessions  . Seasonal allergies   . Skin cancer    scalp  . Wears glasses     Past Surgical History:  Procedure Laterality Date  . BACK SURGERY  2009  . COLONOSCOPY W/ BIOPSIES AND POLYPECTOMY    . [place]  . LEFT HEART CATH AND CORONARY ANGIOGRAPHY N/A 03/14/2019   Procedure: LEFT HEART CATH AND CORONARY ANGIOGRAPHY;  Surgeon:  [name], MD;  Location: [facility];  Service: Cardiovascular;  Laterality: N/A;  . PROSTATE BIOPSY  2013, 2014, 01/2014   Gleason 7  . RADIOACTIVE SEED IMPLANT N/A 07/01/2014   Procedure: RADIOACTIVE SEED IMPLANT;  Surgeon: [name], MD;  Location[facility];  Service: Urology;  Laterality: N/A;  DR PORTABLE  . TONSILLECTOMY    . 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   Procedure: SHUNT INSERTION VENTRICULAR-PERITONEAL;  Surgeon: [name], MD;  Location: [facility];  Service: Neurosurgery;  Laterality: N/A;  SHUNT INSERTION VENTRICULAR-PERITONEAL     Current Outpatient Medications  Medication Sig Dispense Refill  . acetaminophen (TYLENOL) 500 MG tablet Take 1,000 mg by mouth at bedtime.     [name] Kitchen amoxicillin-clavulanate (AUGMENTIN) 875-125 MG tablet Take 1 tablet by mouth every 12 (twelve) hours for 10 days. 20 tablet 0  . Apoaequorin (PREVAGEN EXTRA STRENGTH) 20 MG CAPS Take 40 mg by mouth daily.     [name] Kitchen aspirin EC 81 MG tablet Take 81 mg by mouth daily.     . hydrochlorothiazide (MICROZIDE) 12.5 MG capsule Take 12.5 mg by mouth daily.   0  . Melatonin 10 MG CAPS Take 10 mg by mouth at bedtime.    . Multiple Vitamin (  MULTIVITAMIN WITH MINERALS) TABS tablet Take 1 tablet by mouth daily.    . Multiple Vitamins-Minerals (PRESERVISION AREDS 2 PO) Take 1 tablet by mouth 2 (two) times daily.     . pantoprazole (PROTONIX) 20 MG tablet Take 20 mg by mouth every other day.    . potassium chloride (K-DUR) 10 MEQ tablet Take 20 mEq by mouth daily.   2  . senna-docusate (SENOKOT-S) 8.6-50 MG tablet Take 1 tablet by mouth daily.    . vitamin B-12 (CYANOCOBALAMIN) 250 MCG tablet Take 250 mcg by mouth daily.     Current Facility-Administered Medications  Medication Dose Route Frequency Provider Last Rate Last Dose  . sodium chloride flush (NS) 0.9 % injection 3 mL  3 mL Intravenous Q12H [name],  M, MD        Allergies:   Patient has no known allergies.     Social History:  The patient  reports that he quit smoking about 58 years ago. His smoking use included cigarettes. He has never used smokeless tobacco. He reports current alcohol use of about 1.0 standard drinks of alcohol per week. He reports that he does not use drugs.   Family History:  The patient's family history includes Cancer in his father and paternal uncle; Dementia (age of onset: 10) in his mother; Diabetes in his father; Heart attack (age of onset: 40) in his father; Lung cancer in his brother.    ROS:  Please see the history of present illness.   Otherwise, review of systems are positive for none.   All other systems are reviewed and negative.    PHYSICAL EXAM: VS:  BP (!) 150/103 (BP Location: Left Arm)   Pulse 96   Temp (!) 97 F (36.1 C)   Ht 5\' 9"  (1.753 m)   Wt 177 lb (80.3 kg)   SpO2 94%   BMI 26.14 kg/m  , BMI Body mass index is 26.14 kg/m. GEN: Well nourished, well developed, in no acute distress  HEENT: normal  Neck: no JVD, carotid bruits, or masses Cardiac: RRR; no murmurs, rubs, or gallops,no edema  Respiratory:  clear to auscultation bilaterally, normal work of breathing GI: soft, nontender, nondistended, + BS, central obesity. MS: no deformity or atrophy. Right radial site without hematoma and pulse is good. Skin: warm and dry, no rash Neuro:  Nonfocal. Walks slowly with some imbalance. Psych: euthymic mood, full affect  Recent Labs: 03/24/2019: TSH 0.954 03/27/2019: ALT 15; BUN 25; Creatinine, Ser 1.44; Hemoglobin 15.0; Platelets 202; Potassium 3.3; Sodium 138    Lipid Panel    Component Value Date/Time   TRIG 256 (H) 03/24/2019 1845      Labs dated 01/31/18: cholesterol 220, triglycerides 138, HDL 51, LDL 141. A1c 5.4%. Creatinine 1.3. Other chemistries, CBC, TSH normal.  Dated 03/03/19: glucose 176, Creatinine 1.1. otherwise CMET, CBC, UA normal. Cholesterol 181, triglycerides 294, HDL 39, LDL 83. PSA normal. A1c 6%.   Wt Readings from  Last 3 Encounters:  04/02/19 177 lb (80.3 kg)  03/27/19 177 lb 14.6 oz (80.7 kg)  03/24/19 177 lb 14.6 oz (80.7 kg)      Other studies Reviewed: Myoview 02/14/18: Study Highlights    The left ventricular ejection fraction is normal (55-65%).  Nuclear stress EF: 63%.  There was no ST segment deviation noted during stress.  The study is normal.   Normal resting and stress perfusion. No ischemia or infarction EF 63%    Cardiac cath 03/14/19:  LEFT HEART CATH AND CORONARY  ANGIOGRAPHY  Conclusion    Prox RCA lesion is 20% stenosed.  Dist RCA lesion is 20% stenosed.  Mid LAD-1 lesion is 20% stenosed.  Mid LAD-2 lesion is 20% stenosed.  The left ventricular systolic function is normal.  LV end diastolic pressure is normal.  There is no mitral valve regurgitation.  The left ventricular ejection fraction is 55-65% by visual estimate.   1. Mild non-obstructive CAD 2. Normal LV systolic function  Medical management of mild CAD.        ASSESSMENT AND PLAN:  1.  Symptoms of sudden exertional fatigue/exhaustion. Myoview study one year ago was normal. He was noted to have coronary calcification on CT this past year. Cardiac cath fortunately showed only nonobstructive CAD. Recommend risk factor modification. I do not have an explanation for his spells of exhaustion but doubt cardiac etiology.  2. DM type 2 diet controlled. 3. HTN controlled 4. Hypercholesterolemia. Apparently did not feel well on stain in the past. Will defer further management to Dr Joylene Draft. Ideally would like LDL < 70.  5. OSA on CPAP  6. 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   Current medicines are reviewed at length with the patient today.  The patient does not have concerns regarding medicines.  The following changes have been made:  no change   Follow up PRN  Signed,  [name], MD  04/02/2019 2:00 PM    [facility] [address]  [place], [place], [address] Phone [phone], Fax [phone]

## 2019-03-28 NOTE — ED Provider Notes (Signed)
Jeremiah [name] Provider Note  CSN: [id number] Arrival date & time: 03/27/19 1520  Chief Complaint(s) Weakness and Fever  HPI Jeremiah [name] is a 78 y.o. male past medical history listed below who presents to the emergency [name] with approximately 1 week of generalized fatigue and fevers.  Patient reports being tested for COVID-19 on November 1.  There was a question whether this was positive or negative but on review of records, Covid test was negative.  Patient was seen on November 3 for similar symptoms and had reassuring work-up with negative UA, CBC, metabolic panel, procalcitonin, dimer, inflammatory markers.  CT head at that time showed no increased hydrocephalus.   Patient currently denies any headache.  He endorses chronic dry cough.  No chest pain or shortness of breath.  No abdominal pain.  No nausea or vomiting.  No diarrhea.  No urinary symptoms.  No recent travel or known sick contacts.  Patient presented to his primary care physician office and noted that he was febrile.  He was sent here for further evaluation and work-up.  HPI  Past Medical History Past Medical History:  Diagnosis Date  . BPH (benign prostatic hyperplasia)   . Central retinal artery occlusion   . Diabetes mellitus without complication (HCC)    diet controlled  . Diverticulosis   . History of kidney stones   . Hyperlipidemia   . Hypertension   . OSA on CPAP    wears cpap  . Osteoarthritis   . 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 S/P radiation therapy 04/23/13 - 05/29/14   Prostate/seminal vesicles, external beam 4500 cGy in 25 sessions  . Seasonal allergies   . Skin cancer    scalp  . Wears glasses    Patient Active Problem List   Diagnosis Date Noted  . Other fatigue   . 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 Small bowel obstruction ([place]) 06/10/2016  . HLD (hyperlipidemia) 06/10/2016  . HTN (hypertension) 06/10/2016  .  Acute kidney injury ([place]) 06/10/2016  . Dehydration, moderate 06/10/2016  . AAA (abdominal aortic aneurysm) ([place]) 06/10/2016  . OSA on CPAP 06/10/2016  . Malignant neoplasm of prostate s/p radiation 2015 03/10/2014   Home Medication(s) Prior to Admission medications   Medication Sig Start Date End Date Taking? Authorizing Provider  acetaminophen (TYLENOL) 500 MG tablet Take 1,000 mg by mouth at bedtime.     [provider]  amoxicillin-clavulanate (AUGMENTIN) 875-125 MG tablet Take 1 tablet by mouth every 12 (twelve) hours for 10 days. 03/28/19 04/07/19  Slater Mcmanaman, Grayce Sessions, MD  Apoaequorin (PREVAGEN EXTRA STRENGTH) 20 MG CAPS Take 40 mg by mouth daily.     [provider]  aspirin EC 81 MG tablet Take 81 mg by mouth daily.     [provider]  hydrochlorothiazide (MICROZIDE) 12.5 MG capsule Take 12.5 mg by mouth daily.  07/14/15   [provider]  Melatonin 10 MG CAPS Take 10 mg by mouth at bedtime.    [provider]  Multiple Vitamin (MULTIVITAMIN WITH MINERALS) TABS tablet Take 1 tablet by mouth daily.    [provider]  Multiple Vitamins-Minerals (PRESERVISION AREDS 2 PO) Take 1 tablet by mouth 2 (two) times daily.     [provider]  pantoprazole (PROTONIX) 20 MG tablet Take 20 mg by mouth every other day. 02/19/19   [provider]  potassium chloride (K-DUR) 10 MEQ tablet Take 20 mEq by mouth  daily.  06/16/15   [provider]  senna-docusate (SENOKOT-S) 8.6-50 MG tablet Take 1 tablet by mouth daily.    [provider]  vitamin B-12 (CYANOCOBALAMIN) 250 MCG tablet Take 250 mcg by mouth daily.    [provider]                                                                                                                                    Past Surgical History Past Surgical History:  Procedure Laterality Date  . BACK SURGERY  2009  . COLONOSCOPY W/ BIOPSIES AND POLYPECTOMY    .  [place]  . LEFT HEART CATH AND CORONARY ANGIOGRAPHY N/A 03/14/2019   Procedure: LEFT HEART CATH AND CORONARY ANGIOGRAPHY;  Surgeon: [name], MD;  Location: [facility];  Service: Cardiovascular;  Laterality: N/A;  . PROSTATE BIOPSY  2013, 2014, 01/2014   Gleason 7  . RADIOACTIVE SEED IMPLANT N/A 07/01/2014   Procedure: RADIOACTIVE SEED IMPLANT;  Surgeon: [name], MD;  Location: [facility];  Service: Urology;  Laterality: N/A;  DR PORTABLE  . TONSILLECTOMY    . 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   Procedure: SHUNT INSERTION VENTRICULAR-PERITONEAL;  Surgeon: [name], MD;  Location: [facility];  Service: Neurosurgery;  Laterality: N/A;  SHUNT INSERTION VENTRICULAR-PERITONEAL   Family History Family History  Problem Relation Age of Onset  . Heart attack Father 83  . Cancer Father        prostate  . Diabetes Father   . Cancer Paternal Uncle        prostate  . Dementia Mother 66  . Lung cancer Brother     Social History Social History   Tobacco Use  . Smoking status: Former Smoker    Types: Cigarettes    Quit date: 03/10/1961    Years since quitting: 58.0  . Smokeless tobacco: Never Used  Substance Use Topics  . Alcohol use: Yes    Alcohol/week: 1.0 standard drinks    Types: 1 Glasses of wine per week    Comment: rare alcohol  . Drug use: No   Allergies Patient has no known allergies.  Review of Systems Review of Systems All other systems are reviewed and are negative for acute change except as noted in the HPI  Physical Exam Vital Signs  I have reviewed the triage vital signs BP 132/80 (BP Location: Right Arm)   Pulse 92   Temp 98.9 F (37.2 C) (Oral)   Resp 18   Ht 5\' 10"  (1.778 m)   Wt 80.7 kg   SpO2 96%   BMI 25.53 kg/m   Physical Exam Vitals signs reviewed.  Constitutional:      General: He is not in acute distress.    Appearance: He is well-developed. He is not diaphoretic.  HENT:      Head: Normocephalic and atraumatic.  Nose: Nose normal.  Eyes:     General: No scleral icterus.       Right eye: No discharge.        Left eye: No discharge.     Conjunctiva/sclera: Conjunctivae normal.     Pupils: Pupils are equal, round, and reactive to light.  Neck:     Musculoskeletal: Normal range of motion and neck supple.  Cardiovascular:     Rate and Rhythm: Normal rate and regular rhythm.     Heart sounds: No murmur. No friction rub. No gallop.   Pulmonary:     Effort: Pulmonary effort is normal. No respiratory distress.     Breath sounds: Normal breath sounds. No stridor. No rales.  Abdominal:     General: There is no distension.     Palpations: Abdomen is soft.     Tenderness: There is abdominal tenderness (mild discomfort) in the left lower quadrant.  Musculoskeletal:        General: No tenderness.  Skin:    General: Skin is warm and dry.     Findings: No erythema or rash.  Neurological:     Mental Status: He is alert and oriented to person, place, and time.     ED Results and Treatments Labs (all labs ordered are listed, but only abnormal results are displayed) Labs Reviewed  COMPREHENSIVE METABOLIC PANEL - Abnormal; Notable for the following components:      Result Value   Potassium 3.3 (*)    Glucose, Bld 148 (*)    BUN 25 (*)    Creatinine, Ser 1.44 (*)    GFR calc non Af Amer 46 (*)    GFR calc Af Amer 53 (*)    All other components within normal limits  CBC WITH DIFFERENTIAL/PLATELET - Abnormal; Notable for the following components:   WBC 12.4 (*)    Neutro Abs 9.8 (*)    Monocytes Absolute 1.3 (*)    All other components within normal limits  URINALYSIS, ROUTINE W REFLEX MICROSCOPIC - Abnormal; Notable for the following components:   Color, Urine AMBER (*)    APPearance HAZY (*)    Protein, ur 100 (*)    Bacteria, UA RARE (*)    All other components within normal limits  CULTURE, BLOOD (ROUTINE X 2)  CULTURE, BLOOD (ROUTINE X 2)  URINE  CULTURE  NOVEL CORONAVIRUS, NAA (HOSP ORDER, SEND-OUT TO REF LAB; TAT 18-24 HRS)  LACTIC ACID, PLASMA  PROTIME-INR                                                                                                                         EKG  EKG Interpretation  Date/Time:  Thursday March 27 2019 15:35:55 EST Ventricular Rate:  103 PR Interval:  162 QRS Duration: 72 QT Interval:  398 QTC Calculation: 521 R Axis:   64 Text Interpretation: Sinus tachycardia Nonspecific ST and T wave abnormality Prolonged QT Abnormal ECG motion artifact Confirmed by Addison Lank [phone])  on 03/28/2019 1:06:03 AM      Radiology Dg Chest 2 View  Result Date: 03/27/2019 CLINICAL DATA:  Suspected sepsis. EXAM: CHEST - 2 VIEW COMPARISON:  Radiograph 03/24/2019 FINDINGS: Shunt catheter tubing courses over the base of the left neck and right chest wall. Chronic elevation the right hemidiaphragm with adjacent atelectatic changes. No focal consolidative processes are seen. No pneumothorax or visible effusion. Cardiomediastinal contours are similar to prior accounting for portable technique and low volumes. The aorta is calcified. The remaining cardiomediastinal contours are unremarkable. Degenerative changes are present in the imaged spine and shoulders. IMPRESSION: 1. Shunt catheter tubing courses over the left neck and right chest wall. 2. Chronic elevation of the right hemidiaphragm with adjacent atelectatic changes. 3. No acute cardiopulmonary process. Electronically Signed   By: Lovena Le M.D.   On: 03/27/2019 16:08   Ct Abdomen Pelvis W Contrast  Result Date: 03/28/2019 CLINICAL DATA:  Abdominal pain EXAM: CT ABDOMEN AND PELVIS WITH CONTRAST TECHNIQUE: Multidetector CT imaging of the abdomen and pelvis was performed using the standard protocol following bolus administration of intravenous contrast. CONTRAST:  24mL OMNIPAQUE IOHEXOL 300 MG/ML  SOLN COMPARISON:  10/24/2018 FINDINGS: Lower chest: Elevation of the  right hemidiaphragm. No acute abnormality. Coronary artery calcifications. Hepatobiliary: Layering gallstones within the gallbladder, stable. No focal hepatic abnormality or biliary ductal dilatation. Pancreas: No focal abnormality or ductal dilatation. Spleen: No focal abnormality.  Normal size. Adrenals/Urinary Tract: No adrenal abnormality. No focal renal abnormality. No stones or hydronephrosis. Urinary bladder is unremarkable. Stomach/Bowel: Sigmoid and descending colonic diverticulosis. Inflammatory stranding around the proximal sigmoid colon compatible with active diverticulitis. Stomach and small bowel decompressed, unremarkable. Vascular/Lymphatic: 3.8 cm infrarenal abdominal aortic aneurysm, stable. No adenopathy. Reproductive: Radiation seeds in the region of the prostate. Other: No free fluid or free air. Ventriculoperitoneal shunt catheter noted in the upper abdomen. Musculoskeletal: No acute bony abnormality. Postoperative changes in the lower lumbar spine. IMPRESSION: Left colonic diverticulosis. Stranding around the proximal sigmoid colon compatible with active diverticulitis. Cholelithiasis. Aortic atherosclerosis. 3.8 cm infrarenal abdominal aortic aneurysm. Recommend followup by ultrasound in 2 years. This recommendation follows ACR consensus guidelines: White Paper of the ACR Incidental Findings Committee II on Vascular Findings. J Am Coll Radiol 2013; 10:789-794. Electronically Signed   By: Rolm Baptise M.D.   On: 03/28/2019 01:48    Pertinent labs & imaging results that were available during my care of the patient were reviewed by me and considered in my medical decision making (see chart for details).  Medications Ordered in ED Medications  sodium chloride 0.9 % bolus 1,000 mL (0 mLs Intravenous Stopped 03/28/19 0222)  iohexol (OMNIPAQUE) 300 MG/ML solution 80 mL (80 mLs Intravenous Contrast Given 03/28/19 0128)  amoxicillin-clavulanate (AUGMENTIN) 875-125 MG per tablet 1 tablet (1  tablet Oral Given 03/28/19 0224)  Procedures Procedures  (including critical care time)  Medical Decision Making / ED Course I have reviewed the nursing notes for this encounter and the patient's prior records (if available in EHR or on provided paperwork).   Jeremiah [name] was evaluated in Emergency [name] on 03/28/2019 for the symptoms described in the history of present illness. He was evaluated in the context of the global COVID-19 pandemic, which necessitated consideration that the patient might be at risk for infection with the SARS-CoV-2 virus that causes COVID-19. Institutional protocols and algorithms that pertain to the evaluation of patients at risk for COVID-19 are in a state of rapid change based on information released by regulatory bodies including the CDC and federal and state organizations. These policies and algorithms were followed during the patient's care in the ED.  Fever resolved without intervention.  Patient is otherwise well-appearing and well-hydrated.  He is nontoxic.  Abdomen with mild discomfort in left lower quadrant.  Given history of diverticulosis, will obtain a CT scan to assess for possible diverticulitis or other intra-abdominal inflammatory/infectious process.  Screening labs obtained and notable for leukocytosis.  Patient also with mild AKI. Chest x-ray without evidence of pneumonia.  UA without evidence of infection.  CT scan consistent with diverticulitis, uncomplicated.  Given first dose of Augmentin in the emergency [name].  Stable for outpatient management.  The patient appears reasonably screened and/or stabilized for discharge and I doubt any other medical condition or other [facility] requiring further screening, evaluation, or treatment in the ED at this time prior to discharge.  The patient is safe for discharge  with strict return precautions.       Final Clinical Impression(s) / ED Diagnoses Final diagnoses:  Diverticulitis     The patient appears reasonably screened and/or stabilized for discharge and I doubt any other medical condition or other [facility] requiring further screening, evaluation, or treatment in the ED at this time prior to discharge.  Disposition: Discharge  Condition: Good  I have discussed the results, Dx and Tx plan with the patient who expressed understanding and agree(s) with the plan. Discharge instructions discussed at great length. The patient was given strict return precautions who verbalized understanding of the instructions. No further questions at time of discharge.    ED Discharge Orders         Ordered    amoxicillin-clavulanate (AUGMENTIN) 875-125 MG tablet  Every 12 hours     03/28/19 0253            Follow Up: [name], [place] [address] [phone]  Schedule an appointment as soon as possible for a visit  in 5-7 days, If symptoms do not improve or  worsen     This chart was dictated using voice recognition software.  Despite best efforts to proofread,  errors can occur which can change the documentation meaning.   Fatima Blank, MD 03/28/19 [phone]

## 2019-03-28 NOTE — ED Notes (Signed)
Pt discharged from ED; instructions provided and scripts given; Pt encouraged to return to ED if symptoms worsen and to f/u with PCP; Pt verbalized understanding of all instructions 

## 2019-03-29 LAB — URINE CULTURE: Culture: <10000 — AB

## 2019-03-29 LAB — NOVEL CORONAVIRUS, NAA (HOSP ORDER, SEND-OUT TO REF LAB; TAT 18-24 HRS): SARS-CoV-2, NAA: NOT DETECTED

## 2019-03-31 DIAGNOSIS — R5383 Other fatigue: Secondary | ICD-10-CM | POA: Diagnosis not present

## 2019-03-31 DIAGNOSIS — K5792 Diverticulitis of intestine, part unspecified, without perforation or abscess without bleeding: Secondary | ICD-10-CM | POA: Diagnosis not present

## 2019-03-31 DIAGNOSIS — I10 Essential (primary) hypertension: Secondary | ICD-10-CM | POA: Diagnosis not present

## 2019-03-31 DIAGNOSIS — K5790 Diverticulosis of intestine, part unspecified, without perforation or abscess without bleeding: Secondary | ICD-10-CM | POA: Diagnosis not present

## 2019-03-31 DIAGNOSIS — E876 Hypokalemia: Secondary | ICD-10-CM | POA: Diagnosis not present

## 2019-04-02 ENCOUNTER — Other Ambulatory Visit: Payer: Self-pay

## 2019-04-02 ENCOUNTER — Encounter: Payer: Self-pay | Admitting: Cardiology

## 2019-04-02 ENCOUNTER — Ambulatory Visit (INDEPENDENT_AMBULATORY_CARE_PROVIDER_SITE_OTHER): Payer: Medicare Other | Admitting: Cardiology

## 2019-04-02 VITALS — BP 150/103 | HR 96 | Temp 97.0°F | Ht 69.0 in | Wt 177.0 lb

## 2019-04-02 DIAGNOSIS — I10 Essential (primary) hypertension: Secondary | ICD-10-CM | POA: Diagnosis not present

## 2019-04-02 DIAGNOSIS — E7800 Pure hypercholesterolemia, unspecified: Secondary | ICD-10-CM | POA: Diagnosis not present

## 2019-04-02 DIAGNOSIS — I2510 Atherosclerotic heart disease of native coronary artery without angina pectoris: Secondary | ICD-10-CM

## 2019-04-02 LAB — CULTURE, BLOOD (ROUTINE X 2)
Culture: NO GROWTH
Special Requests: ADEQUATE

## 2019-04-24 DIAGNOSIS — Z6825 Body mass index (BMI) 25.0-25.9, adult: Secondary | ICD-10-CM | POA: Diagnosis not present

## 2019-04-24 DIAGNOSIS — G9389 Other specified disorders of brain: Secondary | ICD-10-CM | POA: Diagnosis not present

## 2019-05-06 ENCOUNTER — Other Ambulatory Visit: Payer: Self-pay | Admitting: Neurological Surgery

## 2019-05-06 DIAGNOSIS — G9389 Other specified disorders of brain: Secondary | ICD-10-CM

## 2019-05-14 ENCOUNTER — Ambulatory Visit
Admission: RE | Admit: 2019-05-14 | Discharge: 2019-05-14 | Disposition: A | Discharge status: Home / Self Care | Payer: Medicare Other | Source: Ambulatory Visit | Attending: Neurological Surgery | Admitting: Neurological Surgery

## 2019-05-14 DIAGNOSIS — G9389 Other specified disorders of brain: Secondary | ICD-10-CM

## 2019-05-27 DIAGNOSIS — Z6826 Body mass index (BMI) 26.0-26.9, adult: Secondary | ICD-10-CM | POA: Diagnosis not present

## 2019-05-27 DIAGNOSIS — I10 Essential (primary) hypertension: Secondary | ICD-10-CM | POA: Diagnosis not present

## 2019-05-27 DIAGNOSIS — G9389 Other specified disorders of brain: Secondary | ICD-10-CM | POA: Diagnosis not present

## 2019-07-04 ENCOUNTER — Other Ambulatory Visit: Payer: Self-pay

## 2019-07-04 ENCOUNTER — Emergency Department (HOSPITAL_COMMUNITY): Payer: Medicare Other

## 2019-07-04 ENCOUNTER — Encounter (HOSPITAL_COMMUNITY): Payer: Self-pay | Admitting: *Deleted

## 2019-07-04 ENCOUNTER — Inpatient Hospital Stay (HOSPITAL_COMMUNITY)
Admission: EM | Admit: 2019-07-04 | Discharge: 2019-07-15 | DRG: 871 | Disposition: A | Discharge status: Skilled Nursing Facility | Payer: Medicare Other | Attending: Internal Medicine | Admitting: Internal Medicine

## 2019-07-04 DIAGNOSIS — E119 Type 2 diabetes mellitus without complications: Secondary | ICD-10-CM | POA: Diagnosis not present

## 2019-07-04 DIAGNOSIS — N179 Acute kidney failure, unspecified: Secondary | ICD-10-CM | POA: Diagnosis present

## 2019-07-04 DIAGNOSIS — K117 Disturbances of salivary secretion: Secondary | ICD-10-CM | POA: Diagnosis not present

## 2019-07-04 DIAGNOSIS — J690 Pneumonitis due to inhalation of food and vomit: Secondary | ICD-10-CM | POA: Diagnosis not present

## 2019-07-04 DIAGNOSIS — R0902 Hypoxemia: Secondary | ICD-10-CM | POA: Diagnosis not present

## 2019-07-04 DIAGNOSIS — Z20822 Contact with and (suspected) exposure to covid-19: Secondary | ICD-10-CM | POA: Diagnosis present

## 2019-07-04 DIAGNOSIS — G9341 Metabolic encephalopathy: Secondary | ICD-10-CM | POA: Diagnosis present

## 2019-07-04 DIAGNOSIS — J9601 Acute respiratory failure with hypoxia: Secondary | ICD-10-CM | POA: Diagnosis not present

## 2019-07-04 DIAGNOSIS — E860 Dehydration: Secondary | ICD-10-CM | POA: Diagnosis not present

## 2019-07-04 DIAGNOSIS — R4182 Altered mental status, unspecified: Secondary | ICD-10-CM | POA: Diagnosis present

## 2019-07-04 DIAGNOSIS — E785 Hyperlipidemia, unspecified: Secondary | ICD-10-CM | POA: Diagnosis present

## 2019-07-04 DIAGNOSIS — Z66 Do not resuscitate: Secondary | ICD-10-CM | POA: Diagnosis not present

## 2019-07-04 DIAGNOSIS — Z85828 Personal history of other malignant neoplasm of skin: Secondary | ICD-10-CM

## 2019-07-04 DIAGNOSIS — R000 Tachycardia, unspecified: Secondary | ICD-10-CM | POA: Diagnosis not present

## 2019-07-04 DIAGNOSIS — M6281 Muscle weakness (generalized): Secondary | ICD-10-CM | POA: Diagnosis not present

## 2019-07-04 DIAGNOSIS — X58XXXA Exposure to other specified factors, initial encounter: Secondary | ICD-10-CM | POA: Diagnosis present

## 2019-07-04 DIAGNOSIS — Z87442 Personal history of urinary calculi: Secondary | ICD-10-CM | POA: Diagnosis not present

## 2019-07-04 DIAGNOSIS — I10 Essential (primary) hypertension: Secondary | ICD-10-CM | POA: Diagnosis present

## 2019-07-04 DIAGNOSIS — R0689 Other abnormalities of breathing: Secondary | ICD-10-CM | POA: Diagnosis not present

## 2019-07-04 DIAGNOSIS — R17 Unspecified jaundice: Secondary | ICD-10-CM | POA: Diagnosis not present

## 2019-07-04 DIAGNOSIS — R410 Disorientation, unspecified: Secondary | ICD-10-CM | POA: Diagnosis not present

## 2019-07-04 DIAGNOSIS — Z8249 Family history of ischemic heart disease and other diseases of the circulatory system: Secondary | ICD-10-CM

## 2019-07-04 DIAGNOSIS — M1990 Unspecified osteoarthritis, unspecified site: Secondary | ICD-10-CM | POA: Diagnosis not present

## 2019-07-04 DIAGNOSIS — Z923 Personal history of irradiation: Secondary | ICD-10-CM | POA: Diagnosis not present

## 2019-07-04 DIAGNOSIS — J9621 Acute and chronic respiratory failure with hypoxia: Secondary | ICD-10-CM | POA: Diagnosis present

## 2019-07-04 DIAGNOSIS — R531 Weakness: Secondary | ICD-10-CM | POA: Diagnosis not present

## 2019-07-04 DIAGNOSIS — R4020 Unspecified coma: Secondary | ICD-10-CM | POA: Diagnosis not present

## 2019-07-04 DIAGNOSIS — N400 Enlarged prostate without lower urinary tract symptoms: Secondary | ICD-10-CM | POA: Diagnosis present

## 2019-07-04 DIAGNOSIS — S0990XA Unspecified injury of head, initial encounter: Secondary | ICD-10-CM | POA: Diagnosis not present

## 2019-07-04 DIAGNOSIS — R5381 Other malaise: Secondary | ICD-10-CM | POA: Diagnosis present

## 2019-07-04 DIAGNOSIS — S060X9A Concussion with loss of consciousness of unspecified duration, initial encounter: Secondary | ICD-10-CM | POA: Diagnosis present

## 2019-07-04 DIAGNOSIS — Z7982 Long term (current) use of aspirin: Secondary | ICD-10-CM

## 2019-07-04 DIAGNOSIS — E8809 Other disorders of plasma-protein metabolism, not elsewhere classified: Secondary | ICD-10-CM | POA: Diagnosis present

## 2019-07-04 DIAGNOSIS — R1310 Dysphagia, unspecified: Secondary | ICD-10-CM | POA: Diagnosis present

## 2019-07-04 DIAGNOSIS — Z515 Encounter for palliative care: Secondary | ICD-10-CM | POA: Diagnosis not present

## 2019-07-04 DIAGNOSIS — Z7189 Other specified counseling: Secondary | ICD-10-CM | POA: Diagnosis not present

## 2019-07-04 DIAGNOSIS — R7989 Other specified abnormal findings of blood chemistry: Secondary | ICD-10-CM | POA: Diagnosis not present

## 2019-07-04 DIAGNOSIS — K219 Gastro-esophageal reflux disease without esophagitis: Secondary | ICD-10-CM | POA: Diagnosis present

## 2019-07-04 DIAGNOSIS — A419 Sepsis, unspecified organism: Secondary | ICD-10-CM | POA: Diagnosis not present

## 2019-07-04 DIAGNOSIS — G9340 Encephalopathy, unspecified: Secondary | ICD-10-CM | POA: Diagnosis not present

## 2019-07-04 DIAGNOSIS — S199XXA Unspecified injury of neck, initial encounter: Secondary | ICD-10-CM | POA: Diagnosis not present

## 2019-07-04 DIAGNOSIS — M50321 Other cervical disc degeneration at C4-C5 level: Secondary | ICD-10-CM | POA: Diagnosis not present

## 2019-07-04 DIAGNOSIS — R404 Transient alteration of awareness: Secondary | ICD-10-CM | POA: Diagnosis not present

## 2019-07-04 DIAGNOSIS — Z79890 Hormone replacement therapy (postmenopausal): Secondary | ICD-10-CM

## 2019-07-04 DIAGNOSIS — J189 Pneumonia, unspecified organism: Secondary | ICD-10-CM | POA: Diagnosis present

## 2019-07-04 DIAGNOSIS — R509 Fever, unspecified: Secondary | ICD-10-CM | POA: Diagnosis not present

## 2019-07-04 DIAGNOSIS — Z801 Family history of malignant neoplasm of trachea, bronchus and lung: Secondary | ICD-10-CM

## 2019-07-04 DIAGNOSIS — I071 Rheumatic tricuspid insufficiency: Secondary | ICD-10-CM | POA: Diagnosis present

## 2019-07-04 DIAGNOSIS — G4733 Obstructive sleep apnea (adult) (pediatric): Secondary | ICD-10-CM | POA: Diagnosis present

## 2019-07-04 DIAGNOSIS — I679 Cerebrovascular disease, unspecified: Secondary | ICD-10-CM | POA: Diagnosis not present

## 2019-07-04 DIAGNOSIS — Z8546 Personal history of malignant neoplasm of prostate: Secondary | ICD-10-CM

## 2019-07-04 DIAGNOSIS — R279 Unspecified lack of coordination: Secondary | ICD-10-CM | POA: Diagnosis not present

## 2019-07-04 DIAGNOSIS — Z833 Family history of diabetes mellitus: Secondary | ICD-10-CM

## 2019-07-04 DIAGNOSIS — R918 Other nonspecific abnormal finding of lung field: Secondary | ICD-10-CM | POA: Diagnosis not present

## 2019-07-04 DIAGNOSIS — E1159 Type 2 diabetes mellitus with other circulatory complications: Secondary | ICD-10-CM | POA: Diagnosis not present

## 2019-07-04 DIAGNOSIS — I361 Nonrheumatic tricuspid (valve) insufficiency: Secondary | ICD-10-CM | POA: Diagnosis not present

## 2019-07-04 DIAGNOSIS — Z87891 Personal history of nicotine dependence: Secondary | ICD-10-CM

## 2019-07-04 DIAGNOSIS — Z79899 Other long term (current) drug therapy: Secondary | ICD-10-CM

## 2019-07-04 DIAGNOSIS — E876 Hypokalemia: Secondary | ICD-10-CM | POA: Diagnosis not present

## 2019-07-04 DIAGNOSIS — Z743 Need for continuous supervision: Secondary | ICD-10-CM | POA: Diagnosis not present

## 2019-07-04 DIAGNOSIS — R6520 Severe sepsis without septic shock: Secondary | ICD-10-CM | POA: Diagnosis not present

## 2019-07-04 DIAGNOSIS — R7401 Elevation of levels of liver transaminase levels: Secondary | ICD-10-CM | POA: Diagnosis present

## 2019-07-04 DIAGNOSIS — G912 (Idiopathic) normal pressure hydrocephalus: Secondary | ICD-10-CM | POA: Diagnosis not present

## 2019-07-04 DIAGNOSIS — Z982 Presence of cerebrospinal fluid drainage device: Secondary | ICD-10-CM | POA: Diagnosis not present

## 2019-07-04 DIAGNOSIS — J390 Retropharyngeal and parapharyngeal abscess: Secondary | ICD-10-CM | POA: Diagnosis not present

## 2019-07-04 LAB — CBC WITH DIFFERENTIAL/PLATELET
Abs Immature Granulocytes: 0.09 10*3/uL — ABNORMAL HIGH (ref 0.00–0.07)
Basophils Absolute: 0 10*3/uL (ref 0.0–0.1)
Basophils Relative: 0 %
Eosinophils Absolute: 0 10*3/uL (ref 0.0–0.5)
Eosinophils Relative: 0 %
HCT: 51.1 % (ref 39.0–52.0)
Hemoglobin: 16.5 g/dL (ref 13.0–17.0)
Immature Granulocytes: 1 %
Lymphocytes Relative: 3 %
Lymphs Abs: 0.4 10*3/uL — ABNORMAL LOW (ref 0.7–4.0)
MCH: 31.2 pg (ref 26.0–34.0)
MCHC: 32.3 g/dL (ref 30.0–36.0)
MCV: 96.6 fL (ref 80.0–100.0)
Monocytes Absolute: 0.5 10*3/uL (ref 0.1–1.0)
Monocytes Relative: 4 %
Neutro Abs: 12.1 10*3/uL — ABNORMAL HIGH (ref 1.7–7.7)
Neutrophils Relative %: 92 %
Platelets: 175 10*3/uL (ref 150–400)
RBC: 5.29 MIL/uL (ref 4.22–5.81)
RDW: 13.2 % (ref 11.5–15.5)
WBC: 13 10*3/uL — ABNORMAL HIGH (ref 4.0–10.5)
nRBC: 0 % (ref 0.0–0.2)

## 2019-07-04 MED ORDER — ACETAMINOPHEN 650 MG RE SUPP
650.0000 mg | Freq: Once | RECTAL | Status: AC
  Administered 2019-07-05: 650 mg via RECTAL
  Filled 2019-07-04: qty 1

## 2019-07-04 NOTE — ED Triage Notes (Addendum)
Pt arrives via [facility] from home (lives at home with his wife who has dementia). Per EMS report, pt was found on floor (unknown time on the floor). Unable to follow commands, lethargic. Initial saturations 83%, 97% on 8L NRB. Temp 102.3. 500 NS given en route. IV established in the left hand. 142/76, hr 110, 40RR, 216 CBG. ETCO2 36. Pt normally a/ox 4 and ambulatory at baseline. Family reported 1 episode of vomiting.   Pt daughter is stephanie [phone]

## 2019-07-05 ENCOUNTER — Inpatient Hospital Stay (HOSPITAL_COMMUNITY): Payer: Medicare Other

## 2019-07-05 ENCOUNTER — Emergency Department (HOSPITAL_COMMUNITY): Payer: Medicare Other

## 2019-07-05 DIAGNOSIS — Z923 Personal history of irradiation: Secondary | ICD-10-CM | POA: Diagnosis not present

## 2019-07-05 DIAGNOSIS — J9601 Acute respiratory failure with hypoxia: Secondary | ICD-10-CM | POA: Diagnosis present

## 2019-07-05 DIAGNOSIS — E876 Hypokalemia: Secondary | ICD-10-CM | POA: Diagnosis present

## 2019-07-05 DIAGNOSIS — Z515 Encounter for palliative care: Secondary | ICD-10-CM | POA: Diagnosis not present

## 2019-07-05 DIAGNOSIS — J189 Pneumonia, unspecified organism: Secondary | ICD-10-CM | POA: Diagnosis not present

## 2019-07-05 DIAGNOSIS — I361 Nonrheumatic tricuspid (valve) insufficiency: Secondary | ICD-10-CM | POA: Diagnosis not present

## 2019-07-05 DIAGNOSIS — Z7189 Other specified counseling: Secondary | ICD-10-CM | POA: Diagnosis not present

## 2019-07-05 DIAGNOSIS — R000 Tachycardia, unspecified: Secondary | ICD-10-CM | POA: Diagnosis present

## 2019-07-05 DIAGNOSIS — J9621 Acute and chronic respiratory failure with hypoxia: Secondary | ICD-10-CM | POA: Diagnosis present

## 2019-07-05 DIAGNOSIS — R410 Disorientation, unspecified: Secondary | ICD-10-CM | POA: Diagnosis not present

## 2019-07-05 DIAGNOSIS — Z85828 Personal history of other malignant neoplasm of skin: Secondary | ICD-10-CM | POA: Diagnosis not present

## 2019-07-05 DIAGNOSIS — I10 Essential (primary) hypertension: Secondary | ICD-10-CM | POA: Diagnosis present

## 2019-07-05 DIAGNOSIS — E860 Dehydration: Secondary | ICD-10-CM | POA: Diagnosis present

## 2019-07-05 DIAGNOSIS — Z982 Presence of cerebrospinal fluid drainage device: Secondary | ICD-10-CM | POA: Diagnosis not present

## 2019-07-05 DIAGNOSIS — G4733 Obstructive sleep apnea (adult) (pediatric): Secondary | ICD-10-CM | POA: Diagnosis present

## 2019-07-05 DIAGNOSIS — M1990 Unspecified osteoarthritis, unspecified site: Secondary | ICD-10-CM | POA: Diagnosis present

## 2019-07-05 DIAGNOSIS — A419 Sepsis, unspecified organism: Secondary | ICD-10-CM | POA: Diagnosis present

## 2019-07-05 DIAGNOSIS — Z66 Do not resuscitate: Secondary | ICD-10-CM | POA: Diagnosis present

## 2019-07-05 DIAGNOSIS — R17 Unspecified jaundice: Secondary | ICD-10-CM | POA: Diagnosis present

## 2019-07-05 DIAGNOSIS — E119 Type 2 diabetes mellitus without complications: Secondary | ICD-10-CM | POA: Diagnosis present

## 2019-07-05 DIAGNOSIS — R5381 Other malaise: Secondary | ICD-10-CM | POA: Diagnosis present

## 2019-07-05 DIAGNOSIS — R4182 Altered mental status, unspecified: Secondary | ICD-10-CM | POA: Diagnosis present

## 2019-07-05 DIAGNOSIS — K219 Gastro-esophageal reflux disease without esophagitis: Secondary | ICD-10-CM | POA: Diagnosis present

## 2019-07-05 DIAGNOSIS — Z20822 Contact with and (suspected) exposure to covid-19: Secondary | ICD-10-CM | POA: Diagnosis present

## 2019-07-05 DIAGNOSIS — G9341 Metabolic encephalopathy: Secondary | ICD-10-CM | POA: Diagnosis present

## 2019-07-05 DIAGNOSIS — K117 Disturbances of salivary secretion: Secondary | ICD-10-CM | POA: Diagnosis present

## 2019-07-05 DIAGNOSIS — J690 Pneumonitis due to inhalation of food and vomit: Secondary | ICD-10-CM | POA: Diagnosis present

## 2019-07-05 DIAGNOSIS — M6281 Muscle weakness (generalized): Secondary | ICD-10-CM | POA: Diagnosis not present

## 2019-07-05 DIAGNOSIS — Z8546 Personal history of malignant neoplasm of prostate: Secondary | ICD-10-CM | POA: Diagnosis not present

## 2019-07-05 DIAGNOSIS — N179 Acute kidney failure, unspecified: Secondary | ICD-10-CM | POA: Diagnosis present

## 2019-07-05 DIAGNOSIS — X58XXXA Exposure to other specified factors, initial encounter: Secondary | ICD-10-CM | POA: Diagnosis present

## 2019-07-05 DIAGNOSIS — Z87442 Personal history of urinary calculi: Secondary | ICD-10-CM | POA: Diagnosis not present

## 2019-07-05 DIAGNOSIS — R1310 Dysphagia, unspecified: Secondary | ICD-10-CM | POA: Diagnosis present

## 2019-07-05 DIAGNOSIS — S060X9A Concussion with loss of consciousness of unspecified duration, initial encounter: Secondary | ICD-10-CM | POA: Diagnosis present

## 2019-07-05 LAB — ECHOCARDIOGRAM COMPLETE: Weight: 2821.89 oz

## 2019-07-05 LAB — COMPREHENSIVE METABOLIC PANEL
ALT: 11 U/L (ref 0–44)
ALT: 11 U/L (ref 0–44)
AST: 21 U/L (ref 15–41)
AST: 25 U/L (ref 15–41)
Albumin: 3 g/dL — ABNORMAL LOW (ref 3.5–5.0)
Albumin: 3.8 g/dL (ref 3.5–5.0)
Alkaline Phosphatase: 40 U/L (ref 38–126)
Alkaline Phosphatase: 53 U/L (ref 38–126)
Anion gap: 14 (ref 5–15)
Anion gap: 15 (ref 5–15)
BUN: 16 mg/dL (ref 8–23)
BUN: 17 mg/dL (ref 8–23)
CO2: 25 mmol/L (ref 22–32)
CO2: 25 mmol/L (ref 22–32)
Calcium: 8.5 mg/dL — ABNORMAL LOW (ref 8.9–10.3)
Calcium: 9 mg/dL (ref 8.9–10.3)
Chloride: 102 mmol/L (ref 98–111)
Chloride: 103 mmol/L (ref 98–111)
Creatinine, Ser: 1.41 mg/dL — ABNORMAL HIGH (ref 0.61–1.24)
Creatinine, Ser: 1.51 mg/dL — ABNORMAL HIGH (ref 0.61–1.24)
GFR calc Af Amer: 50 mL/min — ABNORMAL LOW (ref >60)
GFR calc Af Amer: 55 mL/min — ABNORMAL LOW (ref >60)
GFR calc non Af Amer: 43 mL/min — ABNORMAL LOW (ref >60)
GFR calc non Af Amer: 47 mL/min — ABNORMAL LOW (ref >60)
Glucose, Bld: 192 mg/dL — ABNORMAL HIGH (ref 70–99)
Glucose, Bld: 210 mg/dL — ABNORMAL HIGH (ref 70–99)
Potassium: 2.9 mmol/L — ABNORMAL LOW (ref 3.5–5.1)
Potassium: 3.4 mmol/L — ABNORMAL LOW (ref 3.5–5.1)
Sodium: 141 mmol/L (ref 135–145)
Sodium: 143 mmol/L (ref 135–145)
Total Bilirubin: 0.7 mg/dL (ref 0.3–1.2)
Total Bilirubin: 1.2 mg/dL (ref 0.3–1.2)
Total Protein: 5.6 g/dL — ABNORMAL LOW (ref 6.5–8.1)
Total Protein: 6.8 g/dL (ref 6.5–8.1)

## 2019-07-05 LAB — PROTIME-INR
INR: 1 (ref 0.8–1.2)
Prothrombin Time: 13.3 seconds (ref 11.4–15.2)

## 2019-07-05 LAB — URINALYSIS, ROUTINE W REFLEX MICROSCOPIC
Bacteria, UA: NONE SEEN
Bilirubin Urine: NEGATIVE
Glucose, UA: 50 mg/dL — AB
Ketones, ur: 5 mg/dL — AB
Leukocytes,Ua: NEGATIVE
Nitrite: NEGATIVE
Protein, ur: NEGATIVE mg/dL
Specific Gravity, Urine: 1.014 (ref 1.005–1.030)
pH: 6 (ref 5.0–8.0)

## 2019-07-05 LAB — RESPIRATORY PANEL BY RT PCR (FLU A&B, COVID)
Influenza A by PCR: NEGATIVE
Influenza B by PCR: NEGATIVE
SARS Coronavirus 2 by RT PCR: NEGATIVE

## 2019-07-05 LAB — TSH: TSH: 0.513 u[IU]/mL (ref 0.350–4.500)

## 2019-07-05 LAB — CBC
HCT: 43.8 % (ref 39.0–52.0)
Hemoglobin: 14.6 g/dL (ref 13.0–17.0)
MCH: 31.4 pg (ref 26.0–34.0)
MCHC: 33.3 g/dL (ref 30.0–36.0)
MCV: 94.2 fL (ref 80.0–100.0)
Platelets: 156 10*3/uL (ref 150–400)
RBC: 4.65 MIL/uL (ref 4.22–5.81)
RDW: 13.2 % (ref 11.5–15.5)
WBC: 17 10*3/uL — ABNORMAL HIGH (ref 4.0–10.5)
nRBC: 0 % (ref 0.0–0.2)

## 2019-07-05 LAB — LACTIC ACID, PLASMA
Lactic Acid, Venous: 2.6 mmol/L (ref 0.5–1.9)
Lactic Acid, Venous: 3.7 mmol/L (ref 0.5–1.9)
Lactic Acid, Venous: 4.8 mmol/L (ref 0.5–1.9)

## 2019-07-05 LAB — AMMONIA: Ammonia: 26 umol/L (ref 9–35)

## 2019-07-05 LAB — MRSA PCR SCREENING: MRSA by PCR: NEGATIVE

## 2019-07-05 LAB — CK: Total CK: 44 U/L — ABNORMAL LOW (ref 49–397)

## 2019-07-05 LAB — HIV ANTIBODY (ROUTINE TESTING W REFLEX): HIV Screen 4th Generation wRfx: NONREACTIVE

## 2019-07-05 LAB — SEDIMENTATION RATE: Sed Rate: 2 mm/hr (ref 0–16)

## 2019-07-05 LAB — POC SARS CORONAVIRUS 2 AG -  ED: SARS Coronavirus 2 Ag: NEGATIVE

## 2019-07-05 LAB — D-DIMER, QUANTITATIVE: D-Dimer, Quant: 0.95 ug/mL-FEU — ABNORMAL HIGH (ref 0.00–0.50)

## 2019-07-05 LAB — VITAMIN B12: Vitamin B-12: 262 pg/mL (ref 180–914)

## 2019-07-05 LAB — TROPONIN I (HIGH SENSITIVITY): Troponin I (High Sensitivity): 19 ng/L — ABNORMAL HIGH (ref <18)

## 2019-07-05 LAB — RPR: RPR Ser Ql: NONREACTIVE

## 2019-07-05 LAB — STREP PNEUMONIAE URINARY ANTIGEN: Strep Pneumo Urinary Antigen: NEGATIVE

## 2019-07-05 MED ORDER — SODIUM CHLORIDE 0.9 % IV SOLN
2.0000 g | Freq: Two times a day (BID) | INTRAVENOUS | Status: DC
  Administered 2019-07-05 – 2019-07-07 (×5): 2 g via INTRAVENOUS
  Filled 2019-07-05 (×6): qty 2

## 2019-07-05 MED ORDER — LACTATED RINGERS IV BOLUS (SEPSIS)
500.0000 mL | Freq: Once | INTRAVENOUS | Status: AC
  Administered 2019-07-05: 500 mL via INTRAVENOUS

## 2019-07-05 MED ORDER — ACETAMINOPHEN 325 MG PO TABS
650.0000 mg | ORAL_TABLET | Freq: Four times a day (QID) | ORAL | Status: DC | PRN
  Administered 2019-07-06 – 2019-07-15 (×9): 650 mg via ORAL
  Filled 2019-07-05 (×9): qty 2

## 2019-07-05 MED ORDER — SODIUM CHLORIDE 0.9 % IV SOLN
500.0000 mg | Freq: Every day | INTRAVENOUS | Status: DC
  Administered 2019-07-05 – 2019-07-07 (×4): 500 mg via INTRAVENOUS
  Filled 2019-07-05 (×5): qty 500

## 2019-07-05 MED ORDER — VANCOMYCIN HCL IN DEXTROSE 1-5 GM/200ML-% IV SOLN
1000.0000 mg | INTRAVENOUS | Status: DC
  Administered 2019-07-06: 1000 mg via INTRAVENOUS
  Filled 2019-07-05: qty 200

## 2019-07-05 MED ORDER — LACTATED RINGERS IV BOLUS (SEPSIS)
1000.0000 mL | Freq: Once | INTRAVENOUS | Status: AC
  Administered 2019-07-05: 1000 mL via INTRAVENOUS

## 2019-07-05 MED ORDER — INFLUENZA VAC A&B SA ADJ QUAD 0.5 ML IM PRSY
0.5000 mL | PREFILLED_SYRINGE | INTRAMUSCULAR | Status: DC
  Filled 2019-07-05: qty 0.5

## 2019-07-05 MED ORDER — POTASSIUM CHLORIDE CRYS ER 20 MEQ PO TBCR
20.0000 meq | EXTENDED_RELEASE_TABLET | Freq: Every day | ORAL | Status: DC
  Administered 2019-07-05 – 2019-07-09 (×5): 20 meq via ORAL
  Filled 2019-07-05 (×2): qty 2
  Filled 2019-07-05: qty 1
  Filled 2019-07-05: qty 2
  Filled 2019-07-05 (×2): qty 1
  Filled 2019-07-05: qty 2

## 2019-07-05 MED ORDER — PANTOPRAZOLE SODIUM 20 MG PO TBEC
20.0000 mg | DELAYED_RELEASE_TABLET | ORAL | Status: DC
  Administered 2019-07-06 – 2019-07-14 (×5): 20 mg via ORAL
  Filled 2019-07-05 (×6): qty 1

## 2019-07-05 MED ORDER — POTASSIUM CHLORIDE IN NACL 20-0.9 MEQ/L-% IV SOLN
INTRAVENOUS | Status: AC
  Filled 2019-07-05 (×2): qty 1000

## 2019-07-05 MED ORDER — VITAMIN B-12 100 MCG PO TABS
250.0000 ug | ORAL_TABLET | Freq: Every day | ORAL | Status: DC
  Administered 2019-07-05 – 2019-07-06 (×2): 250 ug via ORAL
  Filled 2019-07-05 (×3): qty 3

## 2019-07-05 MED ORDER — SENNOSIDES-DOCUSATE SODIUM 8.6-50 MG PO TABS
1.0000 | ORAL_TABLET | Freq: Every day | ORAL | Status: DC
  Administered 2019-07-05 – 2019-07-15 (×11): 1 via ORAL
  Filled 2019-07-05 (×11): qty 1

## 2019-07-05 MED ORDER — SODIUM CHLORIDE 0.9 % IV SOLN
2.0000 g | Freq: Every day | INTRAVENOUS | Status: DC
  Administered 2019-07-05: 2 g via INTRAVENOUS
  Filled 2019-07-05: qty 20

## 2019-07-05 MED ORDER — ASPIRIN EC 81 MG PO TBEC
81.0000 mg | DELAYED_RELEASE_TABLET | Freq: Every day | ORAL | Status: DC
  Administered 2019-07-05 – 2019-07-15 (×11): 81 mg via ORAL
  Filled 2019-07-05 (×11): qty 1

## 2019-07-05 MED ORDER — IOHEXOL 350 MG/ML SOLN
100.0000 mL | Freq: Once | INTRAVENOUS | Status: AC | PRN
  Administered 2019-07-05: 100 mL via INTRAVENOUS

## 2019-07-05 MED ORDER — CHLORHEXIDINE GLUCONATE CLOTH 2 % EX PADS
6.0000 | MEDICATED_PAD | Freq: Every day | CUTANEOUS | Status: DC
  Administered 2019-07-06 – 2019-07-15 (×9): 6 via TOPICAL

## 2019-07-05 MED ORDER — ENOXAPARIN SODIUM 40 MG/0.4ML ~~LOC~~ SOLN
40.0000 mg | SUBCUTANEOUS | Status: DC
  Administered 2019-07-05 – 2019-07-15 (×12): 40 mg via SUBCUTANEOUS
  Filled 2019-07-05 (×11): qty 0.4

## 2019-07-05 MED ORDER — VANCOMYCIN HCL 1500 MG/300ML IV SOLN
1500.0000 mg | Freq: Once | INTRAVENOUS | Status: AC
  Administered 2019-07-05: 1500 mg via INTRAVENOUS
  Filled 2019-07-05: qty 300

## 2019-07-05 NOTE — Progress Notes (Signed)
Patient now has a fever of 102.1 axillary. He remains tachycardic and tachypneic. MEWS score is yellow. Will notify provider and monitor per protocol.   07/05/19 1315  MEWS Score  Temp (!) 102.1 F (38.9 C)  Pulse Rate (!) 102  ECG Heart Rate (!) 102  Resp 17  SpO2 92 %  O2 Device Nasal Cannula  O2 Flow Rate (L/min) 3 L/min  MEWS Score  MEWS Temp 2  MEWS Systolic 0  MEWS Pulse 1  MEWS RR 0  MEWS LOC 0  MEWS Score 3  MEWS Score Color Yellow

## 2019-07-05 NOTE — Progress Notes (Signed)
[facility]                                                                              Patient Demographics  Jeremiah [name], is a 79 y.o. male, DOB - Oct 27, 1940, HWE:[id number]  Admit date - 07/04/2019   Admitting Physician Jani Gravel, MD  Outpatient Primary MD for the patient is [name], MD  Outpatient specialists:   LOS - 0  days    Chief Complaint  Patient presents with   Altered Mental Status       Brief summary  Jeremiah [name]  is a 79 y.o. male,  medical history including but not limited to hypertension, Dm2, prostate cancer s/p XRT, OSA on CPAP, NPH s/p VP shunt, presenting with altered mental status associated with fever, tachycardia, hypoxia and tachypnea after being found on the floor.  Chest x-ray noted acute infiltrate. Patient admitted for acute hypoxic respiratory failure due to pneumonia with secondary sepsis of pulmonary etiology.   History and physicals reviewed and noted, agree with plan of care with continued IV antibiotic and supportive care and follow-up of electrolytes with renal function  Medications  Scheduled Meds:  enoxaparin (LOVENOX) injection  40 mg Subcutaneous Q24H   Continuous Infusions:  0.9 % NaCl with KCl 20 mEq / L     azithromycin Stopped (07/05/19 0208)   ceFEPime (MAXIPIME) IV     [START ON 07/06/2019] vancomycin     vancomycin 1,500 mg (07/05/19 0934)   PRN Meds:.   Antibiotics   Anti-infectives (From admission, onward)   Start     Dose/Rate Route Frequency Ordered Stop   07/06/19 0800  vancomycin (VANCOCIN) IVPB 1000 mg/200 mL premix     1,000 mg 200 mL/hr over 60 Minutes Intravenous Every 24 hours 07/05/19 0631     07/05/19 1000  ceFEPIme (MAXIPIME) 2 g in sodium chloride 0.9 % 100 mL IVPB     2 g 200 mL/hr over 30 Minutes Intravenous Every 12 hours 07/05/19 0631     07/05/19 0800  vancomycin (VANCOREADY) IVPB 1500 mg/300 mL     1,500 mg 150 mL/hr over 120 Minutes Intravenous  Once 07/05/19  0631     07/05/19 0100  cefTRIAXone (ROCEPHIN) 2 g in sodium chloride 0.9 % 100 mL IVPB  Status:  Discontinued     2 g 200 mL/hr over 30 Minutes Intravenous Daily at bedtime 07/05/19 0049 07/05/19 0625   07/05/19 0100  azithromycin (ZITHROMAX) 500 mg in sodium chloride 0.9 % 250 mL IVPB     500 mg 250 mL/hr over 60 Minutes Intravenous Daily at bedtime 07/05/19 0049         Patient is comfortable not in acute distress and clinically unchanged from admission  Vitals:   07/05/19 0330 07/05/19 0400 07/05/19 0430 07/05/19 0837  BP: (!) 142/71 131/64 140/86 (!) 140/91  Pulse: 100 (!) 109 (!) 106 (!) 107  Resp: (!) 34   15  Temp:    98.7 F (37.1 C)  TempSrc:    Axillary  SpO2: 96% 90% 95% 92%  Weight:        Intake/Output Summary (Last 24 hours) at  07/05/2019 1034 Last data filed at 07/05/2019 0539 Gross per 24 hour  Intake 3150 ml  Output 500 ml  Net 2650 ml     Wt Readings from Last 3 Encounters:  07/05/19 80 kg  04/02/19 80.3 kg  03/27/19 80.7 kg       Data Reviewed:  I have personally reviewed following labs and imaging studies  Micro Results Recent Results (from the past 240 hour(s))  Respiratory Panel by RT PCR (Flu A&B, Covid) - Nasopharyngeal Swab     Status: None   Collection Time: 07/05/19 12:36 AM   Specimen: Nasopharyngeal Swab  Result Value Ref Range Status   SARS Coronavirus 2 by RT PCR NEGATIVE NEGATIVE Final    Comment: (NOTE) SARS-CoV-2 target nucleic acids are NOT DETECTED. The SARS-CoV-2 RNA is generally detectable in upper respiratoy specimens during the acute phase of infection. The lowest concentration of SARS-CoV-2 viral copies this assay can detect is 131 copies/mL. A negative result does not preclude SARS-Cov-2 infection and should not be used as the sole basis for treatment or other patient management decisions. A negative result may occur with  improper specimen collection/handling, submission of specimen other than nasopharyngeal swab,  presence of viral mutation(s) within the areas targeted by this assay, and inadequate number of viral copies (<131 copies/mL). A negative result must be combined with clinical observations, patient history, and epidemiological information. The expected result is Negative. Fact Sheet for Patients:  PinkCheek.be Fact Sheet for Healthcare Providers:  GravelBags.it This test is not yet ap proved or cleared by the [place] FDA and  has been authorized for detection and/or diagnosis of SARS-CoV-2 by FDA under an Emergency Use Authorization (EUA). This EUA will remain  in effect (meaning this test can be used) for the duration of the COVID-19 declaration under Section 564(b)(1) of the Act, 21 U.S.C. section 360bbb-3(b)(1), unless the authorization is terminated or revoked sooner.    Influenza A by PCR NEGATIVE NEGATIVE Final   Influenza B by PCR NEGATIVE NEGATIVE Final    Comment: (NOTE) The Xpert Xpress SARS-CoV-2/FLU/RSV assay is intended as an aid in  the diagnosis of influenza from Nasopharyngeal swab specimens and  should not be used as a sole basis for treatment. Nasal washings and  aspirates are unacceptable for Xpert Xpress SARS-CoV-2/FLU/RSV  testing. Fact Sheet for Patients: PinkCheek.be Fact Sheet for Healthcare Providers: GravelBags.it This test is not yet approved or cleared by the [place] FDA and  has been authorized for detection and/or diagnosis of SARS-CoV-2 by  FDA under an Emergency Use Authorization (EUA). This EUA will remain  in effect (meaning this test can be used) for the duration of the  Covid-19 declaration under Section 564(b)(1) of the Act, 21  U.S.C. section 360bbb-3(b)(1), unless the authorization is  terminated or revoked. Performed at [facility], [place] [address], [place], [place] [address]   MRSA PCR Screening     Status:  None   Collection Time: 07/05/19  6:11 AM   Specimen: Nasal Mucosa; Nasopharyngeal  Result Value Ref Range Status   MRSA by PCR NEGATIVE NEGATIVE Final    Comment:        The GeneXpert MRSA Assay (FDA approved for NASAL specimens only), is one component of a comprehensive MRSA colonization surveillance program. It is not intended to diagnose MRSA infection nor to guide or monitor treatment for MRSA infections. Performed at [facility], [place] [address] [place], [place] [address]     Radiology Reports  CT Head Wo Contrast  Result Date: 07/05/2019 CLINICAL DATA:  Found on floor, unable to follow commands, lethargic EXAM: CT HEAD WITHOUT CONTRAST CT CERVICAL SPINE WITHOUT CONTRAST TECHNIQUE: Multidetector CT imaging of the head and cervical spine was performed following the standard protocol without intravenous contrast. Multiplanar CT image reconstructions of the cervical spine were also generated. COMPARISON:  CT head 05/14/2019, MRI 06/16/2012, FINDINGS: CT HEAD FINDINGS Brain: Stable positioning of a right parietal approach shunt catheter crossing midline terminating in the body of the left lateral ventricle. Reservoir in the posterior parietal scalp. No catheter discontinuity is seen is a courses towards the base of the right neck. Ventricular caliber is unchanged from prior. Symmetric prominence of the cisterns and sulci compatible with a background of parenchymal volume loss. Patchy areas of white matter hypoattenuation are most compatible with chronic microvascular angiopathy. No evidence of acute infarction, hemorrhage, hydrocephalus, extra-axial collection or mass lesion/mass effect. Vascular: No hyperdense vessel or unexpected calcification. Skull: No calvarial fracture or suspicious osseous lesion. No scalp swelling or hematoma. Sinuses/Orbits: Minimal mural thickening within the ethmoids. Paranasal sinuses are otherwise predominantly clear. Mastoid air cells are clear. Chronic  nasal bone deformities are again noted. Included orbital structures are unremarkable. Other: None CT CERVICAL SPINE FINDINGS Alignment: Slight exaggeration of the cervical lordosis. No traumatic listhesis. No abnormally widened, perched or jumped facets. Craniocervical and atlantoaxial articulations are normally aligned. Mild left lateral neck flexion and rightward head rotation. Skull base and vertebrae: No acute fracture. No primary bone lesion or focal pathologic process. Soft tissues and spinal canal: No pre or paravertebral fluid or swelling. No visible canal hematoma. Benign enthesopathic calcifications in the nuchal ligament. Intact catheter tubing coursing along the right lateral neck to the anterior chest wall. No shunt catheter discontinuity is seen. Disc levels: Multilevel intervertebral disc height loss with spondylitic endplate changes. Features are maximal and C5-6 with a posterior disc osteophyte complex resulting in mild canal stenosis. Uncinate spurring and facet hypertrophic changes result in mild to moderate multilevel foraminal narrowing more pronounced on the right and left. Upper chest: No acute abnormality in the upper chest or imaged lung apices. Other: Cervical carotid atherosclerosis. Normal thyroid. IMPRESSION: 1. No acute intracranial findings. No calvarial fracture or significant scalp swelling. 2. Stable positioning of a right parietal approach shunt catheter. No evidence of shunt catheter discontinuity. 3. Stable mild ventricular dilatation. 4. Chronic microvascular ischemic changes and parenchymal volume loss. 5. No acute fracture or traumatic listhesis of the cervical spine. 6. Multilevel disc and facet degeneration. Features maximal at C5-6. Electronically Signed   By: Lovena Le M.D.   On: 07/05/2019 00:40   CT ANGIO CHEST PE W OR WO CONTRAST  Result Date: 07/05/2019 CLINICAL DATA:  Elevated D-dimer. Tachycardia. Possible pulmonary emboli. EXAM: CT ANGIOGRAPHY CHEST WITH  CONTRAST TECHNIQUE: Multidetector CT imaging of the chest was performed using the standard protocol during bolus administration of intravenous contrast. Multiplanar CT image reconstructions and MIPs were obtained to evaluate the vascular anatomy. CONTRAST:  135mL OMNIPAQUE IOHEXOL 350 MG/ML SOLN COMPARISON:  Chest radiography same day.  Abdominal CT 10/24/2018. FINDINGS: Cardiovascular: Pulmonary arterial opacification is good. There are no pulmonary emboli. Heart size upper limits of normal. Coronary artery calcification is present. There is aortic atherosclerosis. Mediastinum/Nodes: No mediastinal or hilar mass or lymphadenopathy. Lungs/Pleura: Chronic elevation of the right hemidiaphragm with volume loss and or infiltrate in the right lower lobe. Lesser volume loss/infiltrate also in the left lower lobe. No evidence of pleural fluid. Upper  Abdomen: No acute abdominal finding. Elevated right hemidiaphragm as noted above, with interposition of the bowel between the liver and the diaphragm. Musculoskeletal: Negative Review of the MIP images confirms the above findings. IMPRESSION: No pulmonary emboli. Coronary artery calcification.  Aortic atherosclerosis. Elevation of the right hemidiaphragm. Atelectasis/infiltrate in both lower lobes, right worse than left. Some degree of this is chronic, but it appears more pronounced than was noted on an abdominal CT of 10/24/2018. Electronically Signed   By: Nelson Chimes M.D.   On: 07/05/2019 05:32   CT Cervical Spine Wo Contrast  Result Date: 07/05/2019 CLINICAL DATA:  Found on floor, unable to follow commands, lethargic EXAM: CT HEAD WITHOUT CONTRAST CT CERVICAL SPINE WITHOUT CONTRAST TECHNIQUE: Multidetector CT imaging of the head and cervical spine was performed following the standard protocol without intravenous contrast. Multiplanar CT image reconstructions of the cervical spine were also generated. COMPARISON:  CT head 05/14/2019, MRI 06/16/2012, FINDINGS: CT HEAD  FINDINGS Brain: Stable positioning of a right parietal approach shunt catheter crossing midline terminating in the body of the left lateral ventricle. Reservoir in the posterior parietal scalp. No catheter discontinuity is seen is a courses towards the base of the right neck. Ventricular caliber is unchanged from prior. Symmetric prominence of the cisterns and sulci compatible with a background of parenchymal volume loss. Patchy areas of white matter hypoattenuation are most compatible with chronic microvascular angiopathy. No evidence of acute infarction, hemorrhage, hydrocephalus, extra-axial collection or mass lesion/mass effect. Vascular: No hyperdense vessel or unexpected calcification. Skull: No calvarial fracture or suspicious osseous lesion. No scalp swelling or hematoma. Sinuses/Orbits: Minimal mural thickening within the ethmoids. Paranasal sinuses are otherwise predominantly clear. Mastoid air cells are clear. Chronic nasal bone deformities are again noted. Included orbital structures are unremarkable. Other: None CT CERVICAL SPINE FINDINGS Alignment: Slight exaggeration of the cervical lordosis. No traumatic listhesis. No abnormally widened, perched or jumped facets. Craniocervical and atlantoaxial articulations are normally aligned. Mild left lateral neck flexion and rightward head rotation. Skull base and vertebrae: No acute fracture. No primary bone lesion or focal pathologic process. Soft tissues and spinal canal: No pre or paravertebral fluid or swelling. No visible canal hematoma. Benign enthesopathic calcifications in the nuchal ligament. Intact catheter tubing coursing along the right lateral neck to the anterior chest wall. No shunt catheter discontinuity is seen. Disc levels: Multilevel intervertebral disc height loss with spondylitic endplate changes. Features are maximal and C5-6 with a posterior disc osteophyte complex resulting in mild canal stenosis. Uncinate spurring and facet  hypertrophic changes result in mild to moderate multilevel foraminal narrowing more pronounced on the right and left. Upper chest: No acute abnormality in the upper chest or imaged lung apices. Other: Cervical carotid atherosclerosis. Normal thyroid. IMPRESSION: 1. No acute intracranial findings. No calvarial fracture or significant scalp swelling. 2. Stable positioning of a right parietal approach shunt catheter. No evidence of shunt catheter discontinuity. 3. Stable mild ventricular dilatation. 4. Chronic microvascular ischemic changes and parenchymal volume loss. 5. No acute fracture or traumatic listhesis of the cervical spine. 6. Multilevel disc and facet degeneration. Features maximal at C5-6. Electronically Signed   By: Lovena Le M.D.   On: 07/05/2019 00:40   DG Chest Portable 1 View  Result Date: 07/05/2019 CLINICAL DATA:  Fever. Lethargy. Hypoxia. EXAM: PORTABLE CHEST 1 VIEW COMPARISON:  Most recent radiograph 03/27/2019 FINDINGS: Persistent low lung volumes. Chronic elevation of right hemidiaphragm unchanged heart size and mediastinal contours. Heterogeneous streaky bibasilar opacities, with slight patchy opacities in  the left mid lung. No pleural fluid or pneumothorax. Shunt catheter tubing projects over the right neck and chest wall. IMPRESSION: Low lung volumes with streaky bibasilar and patchy left midlung opacities. Findings are suspicious for pneumonia in the setting of fever. Electronically Signed   By: Keith Rake M.D.   On: 07/05/2019 00:10    Lab Data:  CBC: Recent Labs  Lab 07/04/19 2332 07/05/19 0700  WBC 13.0* 17.0*  NEUTROABS 12.1*  --   HGB 16.5 14.6  HCT 51.1 43.8  MCV 96.6 94.2  PLT 175 324   Basic Metabolic Panel: Recent Labs  Lab 07/04/19 2332 07/05/19 0700  NA 143 141  K 2.9* 3.4*  CL 103 102  CO2 25 25  GLUCOSE 192* 210*  BUN 17 16  CREATININE 1.51* 1.41*  CALCIUM 9.0 8.5*   GFR: Estimated Creatinine Clearance: 42.5 mL/min (A) (by C-G formula  based on SCr of 1.41 mg/dL (H)). Liver Function Tests: Recent Labs  Lab 07/04/19 2332 07/05/19 0700  AST 21 25  ALT 11 11  ALKPHOS 53 40  BILITOT 1.2 0.7  PROT 6.8 5.6*  ALBUMIN 3.8 3.0*   No results for input(s): LIPASE, AMYLASE in the last 168 hours. Recent Labs  Lab 07/05/19 0700  AMMONIA 26   Coagulation Profile: Recent Labs  Lab 07/04/19 2332  INR 1.0   Cardiac Enzymes: Recent Labs  Lab 07/04/19 2332  CKTOTAL 44*   BNP (last 3 results) No results for input(s): PROBNP in the last 8760 hours. HbA1C: No results for input(s): HGBA1C in the last 72 hours. CBG: No results for input(s): GLUCAP in the last 168 hours. Lipid Profile: No results for input(s): CHOL, HDL, LDLCALC, TRIG, CHOLHDL, LDLDIRECT in the last 72 hours. Thyroid Function Tests: Recent Labs    07/05/19 0700  TSH 0.513   Anemia Panel: Recent Labs    07/05/19 0700  VITAMINB12 262   Urine analysis:    Component Value Date/Time   COLORURINE YELLOW 07/04/2019 2332   APPEARANCEUR CLEAR 07/04/2019 2332   LABSPEC 1.014 07/04/2019 2332   PHURINE 6.0 07/04/2019 2332   GLUCOSEU 50 (A) 07/04/2019 2332   HGBUR SMALL (A) 07/04/2019 2332   BILIRUBINUR NEGATIVE 07/04/2019 2332   KETONESUR 5 (A) 07/04/2019 2332   PROTEINUR NEGATIVE 07/04/2019 2332   UROBILINOGEN 1.0 08/25/2008 0326   NITRITE NEGATIVE 07/04/2019 2332   LEUKOCYTESUR NEGATIVE 07/04/2019 2332     Benito Mccreedy M.D. [facility] 07/05/2019, 10:34 AM  Pager: [phone] Between 7am to 7pm - call Pager - [phone]  After 7pm go to [email or web address] - password [email or web address]  Call night coverage person covering after 7pm

## 2019-07-05 NOTE — Progress Notes (Signed)
Pharmacy Antibiotic Note  Jeremiah [name] is a 79 y.o. male admitted on 07/04/2019 with sepsis/PNA.  Pharmacy has been consulted for Vancomycin and Cefepime  dosing.  Plan: Vancomycin 1500 mg IV now, then 1000 mg IV q24h AUC 465 Cefepime 2 g IV q12h  Weight: 176 lb 5.9 oz (80 kg)  Temp (24hrs), Avg:100.7 F (38.2 C), Min:99.5 F (37.5 C), Max:101.9 F (38.8 C)  Recent Labs  Lab 07/04/19 2332 07/05/19 0215  WBC 13.0*  --   CREATININE 1.51*  --   LATICACIDVEN 2.6* 4.8*    Estimated Creatinine Clearance: 39.7 mL/min (A) (by C-G formula based on SCr of 1.51 mg/dL (H)).    No Known Allergies   Caryl Pina 07/05/2019 6:27 AM

## 2019-07-05 NOTE — Progress Notes (Signed)
Notified bedside nurse of need to draw lactic acid.  

## 2019-07-05 NOTE — Progress Notes (Signed)
Echocardiogram 2D Echocardiogram has been performed.  Oneal Deputy Annamay Laymon 07/05/2019, 12:01 PM

## 2019-07-05 NOTE — H&P (Signed)
TRH H&P    Patient Demographics:    Jeremiah [name], is a 79 y.o. male  MRN: [id number]  DOB - 11-08-1940  Admit Date - 07/04/2019  Referring MD/NP/PA:  Ripley Fraise  Outpatient Primary MD for the patient is [name], MD  Patient coming from:  home  Chief complaint-  Found on floor   HPI:    Jeremiah [name]  is a 79 y.o. male, w hypertension, hyperlipidemia, Dm2, prostate cancer s/p XRT, OSA on CPAP, NPH s/p VP shunt, apparently presents with being found on floor.    In ED,  T 101.9, P111, R 25, Bp 154/82   Pox 96%on [place]   Na 143, K 2.9, Bun 17, Creatinine 1.51 Glucose 192 Ast 21, Alt 11 Wbc 13.0, hgb 16.5, Plt 175 Urinalysis wbc 0-5, rbc 0-5 CPK 44  Blood culture x2 pending Covid negative  CT brain/ c spine IMPRESSION: 1. No acute intracranial findings. No calvarial fracture or significant scalp swelling. 2. Stable positioning of a right parietal approach shunt catheter. No evidence of shunt catheter discontinuity. 3. Stable mild ventricular dilatation. 4. Chronic microvascular ischemic changes and parenchymal volume loss. 5. No acute fracture or traumatic listhesis of the cervical spine. 6. Multilevel disc and facet degeneration. Features maximal at C5-6.  CXR IMPRESSION: Low lung volumes with streaky bibasilar and patchy left midlung opacities. Findings are suspicious for pneumonia in the setting of fever.  Pt will be admitted for acute respiratory failure with hypoxia, and pneumonia       Review of systems:    In addition to the HPI above,  No Fever-chills, No Headache, No changes with Vision or hearing, No problems swallowing food or Liquids, No Chest pain, Cough or Shortness of Breath, No Abdominal pain, No Nausea or Vomiting, bowel movements are regular, No Blood in stool or Urine, No dysuria, No new skin rashes or bruises, No new joints pains-aches,  No new  weakness, tingling, numbness in any extremity, No recent weight gain or loss, No polyuria, polydypsia or polyphagia, No significant Mental Stressors.  All other systems reviewed and are negative.    Past History of the following :    Past Medical History:  Diagnosis Date  . BPH (benign prostatic hyperplasia)   . Central retinal artery occlusion   . Diabetes mellitus without complication (HCC)    diet controlled  . Diverticulosis   . History of kidney stones   . Hyperlipidemia   . Hypertension   . OSA on CPAP    wears cpap  . Osteoarthritis   . 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 S/P radiation therapy 04/23/13 - 05/29/14   Prostate/seminal vesicles, external beam 4500 cGy in 25 sessions  . Seasonal allergies   . Skin cancer    scalp  . Wears glasses       Past Surgical History:  Procedure Laterality Date  . BACK SURGERY  2009  . COLONOSCOPY W/ BIOPSIES AND POLYPECTOMY    . [place]  . LEFT  HEART CATH AND CORONARY ANGIOGRAPHY N/A 03/14/2019   Procedure: LEFT HEART CATH AND CORONARY ANGIOGRAPHY;  Surgeon: [name], MD;  Location: [facility];  Service: Cardiovascular;  Laterality: N/A;  . PROSTATE BIOPSY  2013, 2014, 01/2014   Gleason 7  . RADIOACTIVE SEED IMPLANT N/A 07/01/2014   Procedure: RADIOACTIVE SEED IMPLANT;  Surgeon: [name], MD;  Location: [facility];  Service: Urology;  Laterality: N/A;  DR PORTABLE  . TONSILLECTOMY    . 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   Procedure: SHUNT INSERTION VENTRICULAR-PERITONEAL;  Surgeon: [name], MD;  Location: [facility];  Service: Neurosurgery;  Laterality: N/A;  SHUNT INSERTION VENTRICULAR-PERITONEAL      Social History:      Social History   Tobacco Use  . Smoking status: Former Smoker    Types: Cigarettes    Quit date: 03/10/1961    Years since quitting: 58.3  . Smokeless tobacco: Never Used  Substance Use Topics  . Alcohol  use: Yes    Alcohol/week: 1.0 standard drinks    Types: 1 Glasses of wine per week    Comment: rare alcohol       Family History :     Family History  Problem Relation Age of Onset  . Heart attack Father 72  . Cancer Father        prostate  . Diabetes Father   . Cancer Paternal Uncle        prostate  . Dementia Mother 36  . Lung cancer Brother        Home Medications:   Prior to Admission medications   Medication Sig Start Date End Date Taking? Authorizing Provider  acetaminophen (TYLENOL) 500 MG tablet Take 1,000 mg by mouth at bedtime.     [provider]  Apoaequorin (PREVAGEN EXTRA STRENGTH) 20 MG CAPS Take 40 mg by mouth daily.     [provider]  aspirin EC 81 MG tablet Take 81 mg by mouth daily.     [provider]  hydrochlorothiazide (MICROZIDE) 12.5 MG capsule Take 12.5 mg by mouth daily.  07/14/15   [provider]  Melatonin 10 MG CAPS Take 10 mg by mouth at bedtime.    [provider]  Multiple Vitamin (MULTIVITAMIN WITH MINERALS) TABS tablet Take 1 tablet by mouth daily.    [provider]  Multiple Vitamins-Minerals (PRESERVISION AREDS 2 PO) Take 1 tablet by mouth 2 (two) times daily.     [provider]  pantoprazole (PROTONIX) 20 MG tablet Take 20 mg by mouth every other day. 02/19/19   [provider]  potassium chloride (K-DUR) 10 MEQ tablet Take 20 mEq by mouth daily.  06/16/15   [provider]  senna-docusate (SENOKOT-S) 8.6-50 MG tablet Take 1 tablet by mouth daily.    [provider]  vitamin B-12 (CYANOCOBALAMIN) 250 MCG tablet Take 250 mcg by mouth daily.    [provider]     Allergies:    No Known Allergies   Physical Exam:   Vitals  Blood pressure 126/74, pulse (!) 111, temperature 99.5 F (37.5 C), temperature source Oral, resp. rate (!) 30, weight 80 kg, SpO2 94 %.  1.  General:  axoxo1 (person)  2. Psychiatric: euthymic  3.  Neurologic: nonfocal  4. HEENMT:  Anicteric, pupils 1.78m symmetric, direct, consensual, intact eomi Tongue dry, midline Neck: no jvd,   5. Respiratory : Slight crackles right lung base, no wheezing  6. Cardiovascular :  Tachy s1, s2, no m/g/r  7. Gastrointestinal:  Abd: soft, nt, nd, +bs  8. Skin:  Ext: no c/c/e, no rash  9.Musculoskeletal:  Good ROM     Data Review:    CBC Recent Labs  Lab 07/04/19 2332  WBC 13.0*  HGB 16.5  HCT 51.1  PLT 175  MCV 96.6  MCH 31.2  MCHC 32.3  RDW 13.2  LYMPHSABS 0.4*  MONOABS 0.5  EOSABS 0.0  BASOSABS 0.0   ------------------------------------------------------------------------------------------------------------------  Results for orders placed or performed during the hospital encounter of 07/04/19 (from the past 48 hour(s))  Comprehensive metabolic panel     Status: Abnormal   Collection Time: 07/04/19 11:32 PM  Result Value Ref Range   Sodium 143 135 - 145 mmol/L   Potassium 2.9 (L) 3.5 - 5.1 mmol/L   Chloride 103 98 - 111 mmol/L   CO2 25 22 - 32 mmol/L   Glucose, Bld 192 (H) 70 - 99 mg/dL   BUN 17 8 - 23 mg/dL   Creatinine, Ser 1.51 (H) 0.61 - 1.24 mg/dL   Calcium 9.0 8.9 - 10.3 mg/dL   Total Protein 6.8 6.5 - 8.1 g/dL   Albumin 3.8 3.5 - 5.0 g/dL   AST 21 15 - 41 U/L   ALT 11 0 - 44 U/L   Alkaline Phosphatase 53 38 - 126 U/L   Total Bilirubin 1.2 0.3 - 1.2 mg/dL   GFR calc non Af Amer 43 (L) >60 mL/min   GFR calc Af Amer 50 (L) >60 mL/min   Anion gap 15 5 - 15    Comment: Performed at [facility], [address], [place], [place] [address]  Lactic acid, plasma     Status: Abnormal   Collection Time: 07/04/19 11:32 PM  Result Value Ref Range   Lactic Acid, Venous 2.6 (HH) 0.5 - 1.9 mmol/L    Comment: CRITICAL RESULT CALLED TO, READ BACK BY AND VERIFIED WITH: Neldon Labella 07/05/19 0024 WAYK Performed at [facility], [address], [place], [place] [address]   CBC with Differential      Status: Abnormal   Collection Time: 07/04/19 11:32 PM  Result Value Ref Range   WBC 13.0 (H) 4.0 - 10.5 K/uL   RBC 5.29 4.22 - 5.81 MIL/uL   Hemoglobin 16.5 13.0 - 17.0 g/dL   HCT 51.1 39.0 - 52.0 %   MCV 96.6 80.0 - 100.0 fL   MCH 31.2 26.0 - 34.0 pg   MCHC 32.3 30.0 - 36.0 g/dL   RDW 13.2 11.5 - 15.5 %   Platelets 175 150 - 400 K/uL   nRBC 0.0 0.0 - 0.2 %   Neutrophils Relative % 92 %   Neutro Abs 12.1 (H) 1.7 - 7.7 K/uL   Lymphocytes Relative 3 %   Lymphs Abs 0.4 (L) 0.7 - 4.0 K/uL   Monocytes Relative 4 %   Monocytes Absolute 0.5 0.1 - 1.0 K/uL   Eosinophils Relative 0 %   Eosinophils Absolute 0.0 0.0 - 0.5 K/uL   Basophils Relative 0 %   Basophils Absolute 0.0 0.0 - 0.1 K/uL   Immature Granulocytes 1 %   Abs Immature Granulocytes 0.09 (H) 0.00 - 0.07 K/uL    Comment: Performed at [facility] [address], [place], [place] [address]  Protime-INR     Status: None   Collection Time: 07/04/19 11:32 PM  Result Value Ref Range   Prothrombin Time 13.3 11.4 - 15.2 seconds  INR 1.0 0.8 - 1.2    Comment: (NOTE) INR goal varies based on device and disease states. Performed at [facility], [place] [address], [place], [place] [address]   Urinalysis, Routine w reflex microscopic     Status: Abnormal   Collection Time: 07/04/19 11:32 PM  Result Value Ref Range   Color, Urine YELLOW YELLOW   APPearance CLEAR CLEAR   Specific Gravity, Urine 1.014 1.005 - 1.030   pH 6.0 5.0 - 8.0   Glucose, UA 50 (A) NEGATIVE mg/dL   Hgb urine dipstick SMALL (A) NEGATIVE   Bilirubin Urine NEGATIVE NEGATIVE   Ketones, ur 5 (A) NEGATIVE mg/dL   Protein, ur NEGATIVE NEGATIVE mg/dL   Nitrite NEGATIVE NEGATIVE   Leukocytes,Ua NEGATIVE NEGATIVE   RBC / HPF 0-5 0 - 5 RBC/hpf   WBC, UA 0-5 0 - 5 WBC/hpf   Bacteria, UA NONE SEEN NONE SEEN   Mucus PRESENT     Comment: Performed at [facility] [address], [place], [place] [address]  CK     Status: Abnormal   Collection Time:  07/04/19 11:32 PM  Result Value Ref Range   Total CK 44 (L) 49 - 397 U/L    Comment: Performed at [facility], [place] [address], [place], [place] [address]  POC SARS Coronavirus 2 Ag-ED - Nasal Swab (BD Veritor Kit)     Status: None   Collection Time: 07/05/19 12:20 AM  Result Value Ref Range   SARS Coronavirus 2 Ag NEGATIVE NEGATIVE    Comment: (NOTE) SARS-CoV-2 antigen NOT DETECTED.  Negative results are presumptive.  Negative results do not preclude SARS-CoV-2 infection and should not be used as the sole basis for treatment or other patient management decisions, including infection  control decisions, particularly in the presence of clinical signs and  symptoms consistent with COVID-19, or in those who have been in contact with the virus.  Negative results must be combined with clinical observations, patient history, and epidemiological information. The expected result is Negative. Fact Sheet for Patients: PodPark.tn Fact Sheet for Healthcare Providers: GiftContent.is This test is not yet approved or cleared by the [place] FDA and  has been authorized for detection and/or diagnosis of SARS-CoV-2 by FDA under an Emergency Use Authorization (EUA).  This EUA will remain in effect (meaning this test can be used) for the duration of  the COVID-19 de claration under Section 564(b)(1) of the Act, 21 U.S.C. section 360bbb-3(b)(1), unless the authorization is terminated or revoked sooner.   Respiratory Panel by RT PCR (Flu A&B, Covid) - Nasopharyngeal Swab     Status: None   Collection Time: 07/05/19 12:36 AM   Specimen: Nasopharyngeal Swab  Result Value Ref Range   SARS Coronavirus 2 by RT PCR NEGATIVE NEGATIVE    Comment: (NOTE) SARS-CoV-2 target nucleic acids are NOT DETECTED. The SARS-CoV-2 RNA is generally detectable in upper respiratoy specimens during the acute phase of infection. The lowest concentration of  SARS-CoV-2 viral copies this assay can detect is 131 copies/mL. A negative result does not preclude SARS-Cov-2 infection and should not be used as the sole basis for treatment or other patient management decisions. A negative result may occur with  improper specimen collection/handling, submission of specimen other than nasopharyngeal swab, presence of viral mutation(s) within the areas targeted by this assay, and inadequate number of viral copies (<131 copies/mL). A negative result must be combined with clinical observations, patient history, and epidemiological information. The expected  result is Negative. Fact Sheet for Patients:  PinkCheek.be Fact Sheet for Healthcare Providers:  GravelBags.it This test is not yet ap proved or cleared by the [place] FDA and  has been authorized for detection and/or diagnosis of SARS-CoV-2 by FDA under an Emergency Use Authorization (EUA). This EUA will remain  in effect (meaning this test can be used) for the duration of the COVID-19 declaration under Section 564(b)(1) of the Act, 21 U.S.C. section 360bbb-3(b)(1), unless the authorization is terminated or revoked sooner.    Influenza A by PCR NEGATIVE NEGATIVE   Influenza B by PCR NEGATIVE NEGATIVE    Comment: (NOTE) The Xpert Xpress SARS-CoV-2/FLU/RSV assay is intended as an aid in  the diagnosis of influenza from Nasopharyngeal swab specimens and  should not be used as a sole basis for treatment. Nasal washings and  aspirates are unacceptable for Xpert Xpress SARS-CoV-2/FLU/RSV  testing. Fact Sheet for Patients: PinkCheek.be Fact Sheet for Healthcare Providers: GravelBags.it This test is not yet approved or cleared by the [place] FDA and  has been authorized for detection and/or diagnosis of SARS-CoV-2 by  FDA under an Emergency Use Authorization (EUA). This EUA will  remain  in effect (meaning this test can be used) for the duration of the  Covid-19 declaration under Section 564(b)(1) of the Act, 21  U.S.C. section 360bbb-3(b)(1), unless the authorization is  terminated or revoked. Performed at [facility], [place] [address] [place], [place] [address]   Lactic acid, plasma     Status: Abnormal   Collection Time: 07/05/19  2:15 AM  Result Value Ref Range   Lactic Acid, Venous 4.8 (HH) 0.5 - 1.9 mmol/L    Comment: CRITICAL VALUE NOTED.  VALUE IS CONSISTENT WITH PREVIOUSLY REPORTED AND CALLED VALUE. Performed at [facility], [place] [address], [place], [place] [address]     Chemistries  Recent Labs  Lab 07/04/19 2332  NA 143  K 2.9*  CL 103  CO2 25  GLUCOSE 192*  BUN 17  CREATININE 1.51*  CALCIUM 9.0  AST 21  ALT 11  ALKPHOS 53  BILITOT 1.2   ------------------------------------------------------------------------------------------------------------------  ------------------------------------------------------------------------------------------------------------------ GFR: Estimated Creatinine Clearance: 39.7 mL/min (A) (by C-G formula based on SCr of 1.51 mg/dL (H)). Liver Function Tests: Recent Labs  Lab 07/04/19 2332  AST 21  ALT 11  ALKPHOS 53  BILITOT 1.2  PROT 6.8  ALBUMIN 3.8   No results for input(s): LIPASE, AMYLASE in the last 168 hours. No results for input(s): AMMONIA in the last 168 hours. Coagulation Profile: Recent Labs  Lab 07/04/19 2332  INR 1.0   Cardiac Enzymes: Recent Labs  Lab 07/04/19 2332  CKTOTAL 44*   BNP (last 3 results) No results for input(s): PROBNP in the last 8760 hours. HbA1C: No results for input(s): HGBA1C in the last 72 hours. CBG: No results for input(s): GLUCAP in the last 168 hours. Lipid Profile: No results for input(s): CHOL, HDL, LDLCALC, TRIG, CHOLHDL, LDLDIRECT in the last 72 hours. Thyroid Function Tests: No results for input(s): TSH, T4TOTAL, FREET4,  T3FREE, THYROIDAB in the last 72 hours. Anemia Panel: No results for input(s): VITAMINB12, FOLATE, FERRITIN, TIBC, IRON, RETICCTPCT in the last 72 hours.  --------------------------------------------------------------------------------------------------------------- Urine analysis:    Component Value Date/Time   COLORURINE YELLOW 07/04/2019 2332   APPEARANCEUR CLEAR 07/04/2019 2332   LABSPEC 1.014 07/04/2019 2332   PHURINE 6.0 07/04/2019 2332   GLUCOSEU 50 (A) 07/04/2019 2332   HGBUR SMALL (A) 07/04/2019 2332  BILIRUBINUR NEGATIVE 07/04/2019 2332   KETONESUR 5 (A) 07/04/2019 2332   PROTEINUR NEGATIVE 07/04/2019 2332   UROBILINOGEN 1.0 08/25/2008 0326   NITRITE NEGATIVE 07/04/2019 2332   LEUKOCYTESUR NEGATIVE 07/04/2019 2332      Imaging Results:    CT Head Wo Contrast  Result Date: 07/05/2019 CLINICAL DATA:  Found on floor, unable to follow commands, lethargic EXAM: CT HEAD WITHOUT CONTRAST CT CERVICAL SPINE WITHOUT CONTRAST TECHNIQUE: Multidetector CT imaging of the head and cervical spine was performed following the standard protocol without intravenous contrast. Multiplanar CT image reconstructions of the cervical spine were also generated. COMPARISON:  CT head 05/14/2019, MRI 06/16/2012, FINDINGS: CT HEAD FINDINGS Brain: Stable positioning of a right parietal approach shunt catheter crossing midline terminating in the body of the left lateral ventricle. Reservoir in the posterior parietal scalp. No catheter discontinuity is seen is a courses towards the base of the right neck. Ventricular caliber is unchanged from prior. Symmetric prominence of the cisterns and sulci compatible with a background of parenchymal volume loss. Patchy areas of white matter hypoattenuation are most compatible with chronic microvascular angiopathy. No evidence of acute infarction, hemorrhage, hydrocephalus, extra-axial collection or mass lesion/mass effect. Vascular: No hyperdense vessel or unexpected  calcification. Skull: No calvarial fracture or suspicious osseous lesion. No scalp swelling or hematoma. Sinuses/Orbits: Minimal mural thickening within the ethmoids. Paranasal sinuses are otherwise predominantly clear. Mastoid air cells are clear. Chronic nasal bone deformities are again noted. Included orbital structures are unremarkable. Other: None CT CERVICAL SPINE FINDINGS Alignment: Slight exaggeration of the cervical lordosis. No traumatic listhesis. No abnormally widened, perched or jumped facets. Craniocervical and atlantoaxial articulations are normally aligned. Mild left lateral neck flexion and rightward head rotation. Skull base and vertebrae: No acute fracture. No primary bone lesion or focal pathologic process. Soft tissues and spinal canal: No pre or paravertebral fluid or swelling. No visible canal hematoma. Benign enthesopathic calcifications in the nuchal ligament. Intact catheter tubing coursing along the right lateral neck to the anterior chest wall. No shunt catheter discontinuity is seen. Disc levels: Multilevel intervertebral disc height loss with spondylitic endplate changes. Features are maximal and C5-6 with a posterior disc osteophyte complex resulting in mild canal stenosis. Uncinate spurring and facet hypertrophic changes result in mild to moderate multilevel foraminal narrowing more pronounced on the right and left. Upper chest: No acute abnormality in the upper chest or imaged lung apices. Other: Cervical carotid atherosclerosis. Normal thyroid. IMPRESSION: 1. No acute intracranial findings. No calvarial fracture or significant scalp swelling. 2. Stable positioning of a right parietal approach shunt catheter. No evidence of shunt catheter discontinuity. 3. Stable mild ventricular dilatation. 4. Chronic microvascular ischemic changes and parenchymal volume loss. 5. No acute fracture or traumatic listhesis of the cervical spine. 6. Multilevel disc and facet degeneration. Features  maximal at C5-6. Electronically Signed   By: Lovena Le M.D.   On: 07/05/2019 00:40   CT Cervical Spine Wo Contrast  Result Date: 07/05/2019 CLINICAL DATA:  Found on floor, unable to follow commands, lethargic EXAM: CT HEAD WITHOUT CONTRAST CT CERVICAL SPINE WITHOUT CONTRAST TECHNIQUE: Multidetector CT imaging of the head and cervical spine was performed following the standard protocol without intravenous contrast. Multiplanar CT image reconstructions of the cervical spine were also generated. COMPARISON:  CT head 05/14/2019, MRI 06/16/2012, FINDINGS: CT HEAD FINDINGS Brain: Stable positioning of a right parietal approach shunt catheter crossing midline terminating in the body of the left lateral ventricle. Reservoir in the posterior parietal scalp. No catheter  discontinuity is seen is a courses towards the base of the right neck. Ventricular caliber is unchanged from prior. Symmetric prominence of the cisterns and sulci compatible with a background of parenchymal volume loss. Patchy areas of white matter hypoattenuation are most compatible with chronic microvascular angiopathy. No evidence of acute infarction, hemorrhage, hydrocephalus, extra-axial collection or mass lesion/mass effect. Vascular: No hyperdense vessel or unexpected calcification. Skull: No calvarial fracture or suspicious osseous lesion. No scalp swelling or hematoma. Sinuses/Orbits: Minimal mural thickening within the ethmoids. Paranasal sinuses are otherwise predominantly clear. Mastoid air cells are clear. Chronic nasal bone deformities are again noted. Included orbital structures are unremarkable. Other: None CT CERVICAL SPINE FINDINGS Alignment: Slight exaggeration of the cervical lordosis. No traumatic listhesis. No abnormally widened, perched or jumped facets. Craniocervical and atlantoaxial articulations are normally aligned. Mild left lateral neck flexion and rightward head rotation. Skull base and vertebrae: No acute fracture. No  primary bone lesion or focal pathologic process. Soft tissues and spinal canal: No pre or paravertebral fluid or swelling. No visible canal hematoma. Benign enthesopathic calcifications in the nuchal ligament. Intact catheter tubing coursing along the right lateral neck to the anterior chest wall. No shunt catheter discontinuity is seen. Disc levels: Multilevel intervertebral disc height loss with spondylitic endplate changes. Features are maximal and C5-6 with a posterior disc osteophyte complex resulting in mild canal stenosis. Uncinate spurring and facet hypertrophic changes result in mild to moderate multilevel foraminal narrowing more pronounced on the right and left. Upper chest: No acute abnormality in the upper chest or imaged lung apices. Other: Cervical carotid atherosclerosis. Normal thyroid. IMPRESSION: 1. No acute intracranial findings. No calvarial fracture or significant scalp swelling. 2. Stable positioning of a right parietal approach shunt catheter. No evidence of shunt catheter discontinuity. 3. Stable mild ventricular dilatation. 4. Chronic microvascular ischemic changes and parenchymal volume loss. 5. No acute fracture or traumatic listhesis of the cervical spine. 6. Multilevel disc and facet degeneration. Features maximal at C5-6. Electronically Signed   By: Lovena Le M.D.   On: 07/05/2019 00:40   DG Chest Portable 1 View  Result Date: 07/05/2019 CLINICAL DATA:  Fever. Lethargy. Hypoxia. EXAM: PORTABLE CHEST 1 VIEW COMPARISON:  Most recent radiograph 03/27/2019 FINDINGS: Persistent low lung volumes. Chronic elevation of right hemidiaphragm unchanged heart size and mediastinal contours. Heterogeneous streaky bibasilar opacities, with slight patchy opacities in the left mid lung. No pleural fluid or pneumothorax. Shunt catheter tubing projects over the right neck and chest wall. IMPRESSION: Low lung volumes with streaky bibasilar and patchy left midlung opacities. Findings are suspicious  for pneumonia in the setting of fever. Electronically Signed   By: Keith Rake M.D.   On: 07/05/2019 00:10      Assessment & Plan:    Principal Problem:   Acute respiratory failure with hypoxia (HCC) Active Problems:   Pneumonia   Tachycardia    Acute Respiratory Failure with hypoxia Pneumonia Sepsis (fever, tachycardia, leukocytosis) Blood culture x2 vanco iv, cefepime iv, zithromax 534m iv qday  Elevated lactic acid Pt received 3L LR in ED Check lactic acid  + D dimer Check CTA chest  Hypokalemia Replete Check cmp in am  Mild ARF Hydrate with ns iv Check cmp in am  Gerd Cont PPI  AMS Unclear etiology, ? Sepsis Check b12, esr, tsh, rpr, ammonia Consider MRI brain if not improving with abx.    DVT Prophylaxis-   Lovenox - SCDs   AM Labs Ordered, also please review Full Orders  Family  Communication: Admission, patients condition and plan of care including tests being ordered have been discussed with the patient  who indicate understanding and agree with the plan and Code Status.  Code Status:  FULL CODE per patient, notified daughter that patient admitted to [facility]  Admission status: Inpatient: Based on patients clinical presentation and evaluation of above clinical data, I have made determination that patient meets Inpatient criteria at this time.  Pt has sepsis and will require iv abx.  And > 2 nites stay.   Time spent in minutes : 55 minutes   Jani Gravel M.D on 07/05/2019 at 3:15 AM

## 2019-07-05 NOTE — ED Notes (Signed)
Transported to CT, will transport to floor when CT complete

## 2019-07-05 NOTE — ED Provider Notes (Signed)
[facility] EMERGENCY DEPARTMENT Provider Note   CSN: [id number] Arrival date & time: 07/04/19  2314     History Chief Complaint  Patient presents with  . Altered Mental Status  Level 5 caveat due to altered mental status  Jeremiah [name] is a 79 y.o. male.  The history is provided by the patient and the EMS personnel. The history is limited by the condition of the patient.  Altered Mental Status Presenting symptoms: confusion and lethargy   Severity:  Severe Episode history:  Single Timing:  Constant Progression:  Worsening Chronicity:  New Associated symptoms: fever    Patient with history of CAD, diabetes presents with fever and confusion.  Patient lives at home and was found on the floor, unknown downtime.  Patient was initially hypoxic and febrile.  Patient is normally alert and oriented x4.    Past Medical History:  Diagnosis Date  . BPH (benign prostatic hyperplasia)   . Central retinal artery occlusion   . Diabetes mellitus without complication (HCC)    diet controlled  . Diverticulosis   . History of kidney stones   . Hyperlipidemia   . Hypertension   . OSA on CPAP    wears cpap  . Osteoarthritis   . 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 S/P radiation therapy 04/23/13 - 05/29/14   Prostate/seminal vesicles, external beam 4500 cGy in 25 sessions  . Seasonal allergies   . Skin cancer    scalp  . Wears glasses     Patient Active Problem List   Diagnosis Date Noted  . Other fatigue   . 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 Small bowel obstruction ([place]) 06/10/2016  . HLD (hyperlipidemia) 06/10/2016  . HTN (hypertension) 06/10/2016  . Acute kidney injury ([place]) 06/10/2016  . Dehydration, moderate 06/10/2016  . AAA (abdominal aortic aneurysm) ([place]) 06/10/2016  . OSA on CPAP 06/10/2016  . Malignant neoplasm of prostate s/p radiation 2015 03/10/2014    Past Surgical History:  Procedure Laterality Date  . BACK  SURGERY  2009  . COLONOSCOPY W/ BIOPSIES AND POLYPECTOMY    . [place]  . LEFT HEART CATH AND CORONARY ANGIOGRAPHY N/A 03/14/2019   Procedure: LEFT HEART CATH AND CORONARY ANGIOGRAPHY;  Surgeon: [name], MD;  Location: [facility];  Service: Cardiovascular;  Laterality: N/A;  . PROSTATE BIOPSY  2013, 2014, 01/2014   Gleason 7  . RADIOACTIVE SEED IMPLANT N/A 07/01/2014   Procedure: RADIOACTIVE SEED IMPLANT;  Surgeon: [name], MD;  Location[facility];  Service: Urology;  Laterality: N/A;  DR PORTABLE  . TONSILLECTOMY    . 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   Procedure: SHUNT INSERTION VENTRICULAR-PERITONEAL;  Surgeon: [name], MD;  Location: [facility];  Service: Neurosurgery;  Laterality: N/A;  SHUNT INSERTION VENTRICULAR-PERITONEAL       Family History  Problem Relation Age of Onset  . Heart attack Father 19  . Cancer Father        prostate  . Diabetes Father   . Cancer Paternal Uncle        prostate  . Dementia Mother 11  . Lung cancer Brother     Social History   Tobacco Use  . Smoking status: Former Smoker    Types: Cigarettes    Quit date: 03/10/1961    Years since quitting: 58.3  . Smokeless tobacco: Never Used  Substance Use Topics  . Alcohol  use: Yes    Alcohol/week: 1.0 standard drinks    Types: 1 Glasses of wine per week    Comment: rare alcohol  . Drug use: No    Home Medications Prior to Admission medications   Medication Sig Start Date End Date Taking? Authorizing Provider  acetaminophen (TYLENOL) 500 MG tablet Take 1,000 mg by mouth at bedtime.     [provider]  Apoaequorin (PREVAGEN EXTRA STRENGTH) 20 MG CAPS Take 40 mg by mouth daily.     [provider]  aspirin EC 81 MG tablet Take 81 mg by mouth daily.     [provider]  hydrochlorothiazide (MICROZIDE) 12.5 MG capsule Take 12.5 mg by mouth daily.  07/14/15   [provider]  Melatonin 10 MG  CAPS Take 10 mg by mouth at bedtime.    [provider]  Multiple Vitamin (MULTIVITAMIN WITH MINERALS) TABS tablet Take 1 tablet by mouth daily.    [provider]  Multiple Vitamins-Minerals (PRESERVISION AREDS 2 PO) Take 1 tablet by mouth 2 (two) times daily.     [provider]  pantoprazole (PROTONIX) 20 MG tablet Take 20 mg by mouth every other day. 02/19/19   [provider]  potassium chloride (K-DUR) 10 MEQ tablet Take 20 mEq by mouth daily.  06/16/15   [provider]  senna-docusate (SENOKOT-S) 8.6-50 MG tablet Take 1 tablet by mouth daily.    [provider]  vitamin B-12 (CYANOCOBALAMIN) 250 MCG tablet Take 250 mcg by mouth daily.    [provider]    Allergies    Patient has no known allergies.  Review of Systems   Review of Systems  Unable to perform ROS: Mental status change  Constitutional: Positive for fever.  Psychiatric/Behavioral: Positive for confusion.    Physical Exam Updated Vital Signs BP (!) 154/82 (BP Location: Right Arm)   Pulse (!) 111   Temp (!) 101.9 F (38.8 C)   Resp (!) 25   SpO2 96%   Physical Exam CONSTITUTIONAL: Elderly and frail, ill-appearing HEAD: Normocephalic/atraumatic EYES: EOMI/PERRL ENMT: Mucous membranes dry NECK: supple no meningeal signs SPINE/BACK: No bruising/crepitance/stepoffs noted to spine CV: S1/S2 noted, no murmurs/rubs/gallops noted LUNGS: Tachypneic and coarse breath sounds bilaterally ABDOMEN: soft, nontender, no rebound or guarding, bowel sounds noted throughout abdomen GU:no cva tenderness, no erythema to the genitalia NEURO: Pt is somnolent but easily arousable.  He is able to follow commands EXTREMITIES: pulses normal/equal, full ROM, pelvis stable, all other extremities/joints palpated/ranged and nontender SKIN: warm, color normal PSYCH: Unable to assess ED Results / Procedures / Treatments   Labs (all labs ordered are listed, but only abnormal  results are displayed) Labs Reviewed  COMPREHENSIVE METABOLIC PANEL - Abnormal; Notable for the following components:      Result Value   Potassium 2.9 (*)    Glucose, Bld 192 (*)    Creatinine, Ser 1.51 (*)    GFR calc non Af Amer 43 (*)    GFR calc Af Amer 50 (*)    All other components within normal limits  LACTIC ACID, PLASMA - Abnormal; Notable for the following components:   Lactic Acid, Venous 2.6 (*)    All other components within normal limits  LACTIC ACID, PLASMA - Abnormal; Notable for the following components:   Lactic Acid, Venous 4.8 (*)    All other components within normal limits  CBC WITH DIFFERENTIAL/PLATELET - Abnormal; Notable for the following components:   WBC 13.0 (*)  Neutro Abs 12.1 (*)    Lymphs Abs 0.4 (*)    Abs Immature Granulocytes 0.09 (*)    All other components within normal limits  URINALYSIS, ROUTINE W REFLEX MICROSCOPIC - Abnormal; Notable for the following components:   Glucose, UA 50 (*)    Hgb urine dipstick SMALL (*)    Ketones, ur 5 (*)    All other components within normal limits  CK - Abnormal; Notable for the following components:   Total CK 44 (*)    All other components within normal limits  RESPIRATORY PANEL BY RT PCR (FLU A&B, COVID)  CULTURE, BLOOD (ROUTINE X 2)  CULTURE, BLOOD (ROUTINE X 2)  URINE CULTURE  PROTIME-INR  POC SARS CORONAVIRUS 2 AG -  ED    EKG None  Radiology CT Head Wo Contrast  Result Date: 07/05/2019 CLINICAL DATA:  Found on floor, unable to follow commands, lethargic EXAM: CT HEAD WITHOUT CONTRAST CT CERVICAL SPINE WITHOUT CONTRAST TECHNIQUE: Multidetector CT imaging of the head and cervical spine was performed following the standard protocol without intravenous contrast. Multiplanar CT image reconstructions of the cervical spine were also generated. COMPARISON:  CT head 05/14/2019, MRI 06/16/2012, FINDINGS: CT HEAD FINDINGS Brain: Stable positioning of a right parietal approach shunt catheter crossing  midline terminating in the body of the left lateral ventricle. Reservoir in the posterior parietal scalp. No catheter discontinuity is seen is a courses towards the base of the right neck. Ventricular caliber is unchanged from prior. Symmetric prominence of the cisterns and sulci compatible with a background of parenchymal volume loss. Patchy areas of white matter hypoattenuation are most compatible with chronic microvascular angiopathy. No evidence of acute infarction, hemorrhage, hydrocephalus, extra-axial collection or mass lesion/mass effect. Vascular: No hyperdense vessel or unexpected calcification. Skull: No calvarial fracture or suspicious osseous lesion. No scalp swelling or hematoma. Sinuses/Orbits: Minimal mural thickening within the ethmoids. Paranasal sinuses are otherwise predominantly clear. Mastoid air cells are clear. Chronic nasal bone deformities are again noted. Included orbital structures are unremarkable. Other: None CT CERVICAL SPINE FINDINGS Alignment: Slight exaggeration of the cervical lordosis. No traumatic listhesis. No abnormally widened, perched or jumped facets. Craniocervical and atlantoaxial articulations are normally aligned. Mild left lateral neck flexion and rightward head rotation. Skull base and vertebrae: No acute fracture. No primary bone lesion or focal pathologic process. Soft tissues and spinal canal: No pre or paravertebral fluid or swelling. No visible canal hematoma. Benign enthesopathic calcifications in the nuchal ligament. Intact catheter tubing coursing along the right lateral neck to the anterior chest wall. No shunt catheter discontinuity is seen. Disc levels: Multilevel intervertebral disc height loss with spondylitic endplate changes. Features are maximal and C5-6 with a posterior disc osteophyte complex resulting in mild canal stenosis. Uncinate spurring and facet hypertrophic changes result in mild to moderate multilevel foraminal narrowing more pronounced on  the right and left. Upper chest: No acute abnormality in the upper chest or imaged lung apices. Other: Cervical carotid atherosclerosis. Normal thyroid. IMPRESSION: 1. No acute intracranial findings. No calvarial fracture or significant scalp swelling. 2. Stable positioning of a right parietal approach shunt catheter. No evidence of shunt catheter discontinuity. 3. Stable mild ventricular dilatation. 4. Chronic microvascular ischemic changes and parenchymal volume loss. 5. No acute fracture or traumatic listhesis of the cervical spine. 6. Multilevel disc and facet degeneration. Features maximal at C5-6. Electronically Signed   By: Lovena Le M.D.   On: 07/05/2019 00:40   CT Cervical Spine Wo Contrast  Result Date:  07/05/2019 CLINICAL DATA:  Found on floor, unable to follow commands, lethargic EXAM: CT HEAD WITHOUT CONTRAST CT CERVICAL SPINE WITHOUT CONTRAST TECHNIQUE: Multidetector CT imaging of the head and cervical spine was performed following the standard protocol without intravenous contrast. Multiplanar CT image reconstructions of the cervical spine were also generated. COMPARISON:  CT head 05/14/2019, MRI 06/16/2012, FINDINGS: CT HEAD FINDINGS Brain: Stable positioning of a right parietal approach shunt catheter crossing midline terminating in the body of the left lateral ventricle. Reservoir in the posterior parietal scalp. No catheter discontinuity is seen is a courses towards the base of the right neck. Ventricular caliber is unchanged from prior. Symmetric prominence of the cisterns and sulci compatible with a background of parenchymal volume loss. Patchy areas of white matter hypoattenuation are most compatible with chronic microvascular angiopathy. No evidence of acute infarction, hemorrhage, hydrocephalus, extra-axial collection or mass lesion/mass effect. Vascular: No hyperdense vessel or unexpected calcification. Skull: No calvarial fracture or suspicious osseous lesion. No scalp swelling or  hematoma. Sinuses/Orbits: Minimal mural thickening within the ethmoids. Paranasal sinuses are otherwise predominantly clear. Mastoid air cells are clear. Chronic nasal bone deformities are again noted. Included orbital structures are unremarkable. Other: None CT CERVICAL SPINE FINDINGS Alignment: Slight exaggeration of the cervical lordosis. No traumatic listhesis. No abnormally widened, perched or jumped facets. Craniocervical and atlantoaxial articulations are normally aligned. Mild left lateral neck flexion and rightward head rotation. Skull base and vertebrae: No acute fracture. No primary bone lesion or focal pathologic process. Soft tissues and spinal canal: No pre or paravertebral fluid or swelling. No visible canal hematoma. Benign enthesopathic calcifications in the nuchal ligament. Intact catheter tubing coursing along the right lateral neck to the anterior chest wall. No shunt catheter discontinuity is seen. Disc levels: Multilevel intervertebral disc height loss with spondylitic endplate changes. Features are maximal and C5-6 with a posterior disc osteophyte complex resulting in mild canal stenosis. Uncinate spurring and facet hypertrophic changes result in mild to moderate multilevel foraminal narrowing more pronounced on the right and left. Upper chest: No acute abnormality in the upper chest or imaged lung apices. Other: Cervical carotid atherosclerosis. Normal thyroid. IMPRESSION: 1. No acute intracranial findings. No calvarial fracture or significant scalp swelling. 2. Stable positioning of a right parietal approach shunt catheter. No evidence of shunt catheter discontinuity. 3. Stable mild ventricular dilatation. 4. Chronic microvascular ischemic changes and parenchymal volume loss. 5. No acute fracture or traumatic listhesis of the cervical spine. 6. Multilevel disc and facet degeneration. Features maximal at C5-6. Electronically Signed   By: Lovena Le M.D.   On: 07/05/2019 00:40   DG Chest  Portable 1 View  Result Date: 07/05/2019 CLINICAL DATA:  Fever. Lethargy. Hypoxia. EXAM: PORTABLE CHEST 1 VIEW COMPARISON:  Most recent radiograph 03/27/2019 FINDINGS: Persistent low lung volumes. Chronic elevation of right hemidiaphragm unchanged heart size and mediastinal contours. Heterogeneous streaky bibasilar opacities, with slight patchy opacities in the left mid lung. No pleural fluid or pneumothorax. Shunt catheter tubing projects over the right neck and chest wall. IMPRESSION: Low lung volumes with streaky bibasilar and patchy left midlung opacities. Findings are suspicious for pneumonia in the setting of fever. Electronically Signed   By: Keith Rake M.D.   On: 07/05/2019 00:10    Procedures .Critical Care Performed by: Ripley Fraise, MD Authorized by: Ripley Fraise, MD   Critical care provider statement:    Critical care time (minutes):  45   Critical care start time:  07/05/2019 12:15 AM   Critical care  end time:  07/05/2019 1:00 AM   Critical care time was exclusive of:  Separately billable procedures and treating other patients   Critical care was necessary to treat or prevent imminent or life-threatening deterioration of the following conditions:  Respiratory failure and sepsis   Critical care was time spent personally by me on the following activities:  Ordering and review of radiographic studies, ordering and review of laboratory studies, ordering and performing treatments and interventions, pulse oximetry, re-evaluation of patient's condition, examination of patient, evaluation of patient's response to treatment and development of treatment plan with patient or surrogate   I assumed direction of critical care for this patient from another provider in my specialty: no      Medications Ordered in ED Medications  cefTRIAXone (ROCEPHIN) 2 g in sodium chloride 0.9 % 100 mL IVPB (0 g Intravenous Stopped 07/05/19 0156)  azithromycin (ZITHROMAX) 500 mg in sodium chloride  0.9 % 250 mL IVPB (0 mg Intravenous Stopped 07/05/19 0208)  acetaminophen (TYLENOL) suppository 650 mg (650 mg Rectal Given 07/05/19 0036)  lactated ringers bolus 1,000 mL (0 mLs Intravenous Stopped 07/05/19 0156)    And  lactated ringers bolus 1,000 mL (0 mLs Intravenous Stopped 07/05/19 0208)    And  lactated ringers bolus 500 mL (500 mLs Intravenous New Bag/Given 07/05/19 0209)    ED Course  I have reviewed the triage vital signs and the nursing notes.  Pertinent labs & imaging results that were available during my care of the patient were reviewed by me and considered in my medical decision making (see chart for details).    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  Patient was initially hypoxic.  Chest x-ray reveals pneumonia, COVID-19 testing pending Discussed with daughter via phone.  It is unclear how long patient was on the floor.  He is normally awake alert and ambulatory at baseline Work-up pending at this time 1:24 AM CT head and C-spine negative for acute process.  Will treat patient for pneumonia.  Confirmatory COVID-19 test is pending Patient resting comfortably but easily arousable.  No signs of respiratory distress at this time 3:08 AM Covid testing negative Vitals are actually improving, but lactate is worsening.  He is received IV fluids and antibiotics He does have intermittent rigoring likely from his infection. Patient is awake and alert, no meningeal signs Discussed with Dr. Maudie Mercury for admission.  Will be treated for pneumonia.  I have also updated his daughter [name] via phone. BP 126/74   Pulse (!) 111   Temp 99.5 F (37.5 C) (Oral)   Resp (!) 30   Wt 80 kg   SpO2 94%   BMI 26.05 kg/m    Sepsis - Repeat Assessment  Performed at:    0305  Vitals     Blood pressure 126/74, pulse (!) 111, temperature  99.5 F (37.5 C), temperature source Oral, resp. rate (!) 30, weight 80 kg, SpO2 94 %.  Heart:     Tachycardic  Lungs:    Rales  Capillary Refill:   <2 sec  Peripheral Pulse:   Radial pulse palpable  Skin:     Normal Color     Jeremiah [name] was evaluated in Emergency Department on 07/05/2019 for the symptoms described in the history  of present illness. He was evaluated in the context of the global COVID-19 pandemic, which necessitated consideration that the patient might be at risk for infection with the SARS-CoV-2 virus that causes COVID-19. Institutional protocols and algorithms that pertain to the evaluation of patients at risk for COVID-19 are in a state of rapid change based on information released by regulatory bodies including the CDC and federal and state organizations. These policies and algorithms were followed during the patient's care in the ED.  Final Clinical Impression(s) / ED Diagnoses Final diagnoses:  Community acquired pneumonia of left lower lobe of lung  AKI (acute kidney injury) ([place])  Dehydration  Hypokalemia    Rx / DC Orders ED Discharge Orders    None       Ripley Fraise, MD 07/05/19 0310

## 2019-07-06 LAB — COMPREHENSIVE METABOLIC PANEL
ALT: 12 U/L (ref 0–44)
AST: 57 U/L — ABNORMAL HIGH (ref 15–41)
Albumin: 2.9 g/dL — ABNORMAL LOW (ref 3.5–5.0)
Alkaline Phosphatase: 36 U/L — ABNORMAL LOW (ref 38–126)
Anion gap: 13 (ref 5–15)
BUN: 16 mg/dL (ref 8–23)
CO2: 27 mmol/L (ref 22–32)
Calcium: 8.5 mg/dL — ABNORMAL LOW (ref 8.9–10.3)
Chloride: 102 mmol/L (ref 98–111)
Creatinine, Ser: 1.23 mg/dL (ref 0.61–1.24)
GFR calc Af Amer: >60 mL/min (ref >60)
GFR calc non Af Amer: 55 mL/min — ABNORMAL LOW (ref >60)
Glucose, Bld: 129 mg/dL — ABNORMAL HIGH (ref 70–99)
Potassium: 3.1 mmol/L — ABNORMAL LOW (ref 3.5–5.1)
Sodium: 142 mmol/L (ref 135–145)
Total Bilirubin: 1.4 mg/dL — ABNORMAL HIGH (ref 0.3–1.2)
Total Protein: 5.4 g/dL — ABNORMAL LOW (ref 6.5–8.1)

## 2019-07-06 LAB — URINE CULTURE: Culture: NO GROWTH

## 2019-07-06 LAB — CBC
HCT: 40.4 % (ref 39.0–52.0)
Hemoglobin: 13 g/dL (ref 13.0–17.0)
MCH: 30.8 pg (ref 26.0–34.0)
MCHC: 32.2 g/dL (ref 30.0–36.0)
MCV: 95.7 fL (ref 80.0–100.0)
Platelets: 155 10*3/uL (ref 150–400)
RBC: 4.22 MIL/uL (ref 4.22–5.81)
RDW: 13.2 % (ref 11.5–15.5)
WBC: 11.6 10*3/uL — ABNORMAL HIGH (ref 4.0–10.5)
nRBC: 0 % (ref 0.0–0.2)

## 2019-07-06 MED ORDER — POTASSIUM CHLORIDE CRYS ER 20 MEQ PO TBCR
40.0000 meq | EXTENDED_RELEASE_TABLET | Freq: Two times a day (BID) | ORAL | Status: AC
  Administered 2019-07-06 – 2019-07-07 (×3): 40 meq via ORAL
  Filled 2019-07-06 (×3): qty 2

## 2019-07-06 MED ORDER — HYDRALAZINE HCL 50 MG PO TABS
50.0000 mg | ORAL_TABLET | Freq: Three times a day (TID) | ORAL | Status: DC | PRN
  Administered 2019-07-06 – 2019-07-07 (×3): 50 mg via ORAL
  Filled 2019-07-06 (×3): qty 1

## 2019-07-06 NOTE — Progress Notes (Signed)
Notified Dr. Vista Lawman regarding BP 184/91 to request PRN. Patient has been mildly febrile today and remains tachypneic and confused, thus he continues to score yellow MEWS. PRN Tylenol available for admin. See MAR. Will continue to monitor.   07/06/19 1300  MEWS Score  Temp (!) 100.4 F (38 C)  BP (!) 184/91  Pulse Rate 92  ECG Heart Rate 91  Resp (!) 28  Level of Consciousness Alert  SpO2 97 %  O2 Device Nasal Cannula  Patient Activity (if Appropriate) In bed  O2 Flow Rate (L/min) 3 L/min  MEWS Score  MEWS Temp 0  MEWS Systolic 0  MEWS Pulse 0  MEWS RR 2  MEWS LOC 0  MEWS Score 2  MEWS Score Color Yellow  MEWS Assessment  Is this an acute change? No

## 2019-07-06 NOTE — Progress Notes (Signed)
[facility]                                                                              Patient Demographics  Jeremiah [name], is a 79 y.o. male, DOB - 04-04-1941, WUX:[id number]  Admit date - 07/04/2019   Admitting Physician Jani Gravel, MD  Outpatient Primary MD for the patient is [name], MD  Outpatient specialists:   LOS - 1  days    Chief Complaint  Patient presents with  . Altered Mental Status       Brief summary  Jeremiah [name]  is a 79 y.o. male,  medical history including but not limited to hypertension, Dm2, prostate cancer s/p XRT, OSA on CPAP, NPH s/p VP shunt, presenting with altered mental status associated with fever, tachycardia, hypoxia and tachypnea after being found on the floor.  Chest x-ray noted acute infiltrate. Patient admitted for acute hypoxic respiratory failure due to pneumonia with secondary sepsis of pulmonary etiology.    Assessment & Plan    Principal Problem:   Acute respiratory failure with hypoxia (HCC) Active Problems:   HTN (hypertension)   Pneumonia   Tachycardia   AMS (altered mental status)  Acute hypoxic respiratory failure -Secondary to pneumonia with sepsis -Respiratory supportivecare, hemodynamic monitoring  Sepsis -Presented febrile, tachycardic with leukocytosis -CT angiogram 07/05/2019-negative PE-bilateral lower lobe atelectasis versus infiltrate -Pulmonary source/pneumonia -Continue IV antibiotics cefepime with Zithromax -MRSA PCR negative and therefore vancomycin discontinued -IV fluids -Follow-up on cultures  Mental status change -Patient found on floor, and CT C-spine and head negative admission -Likely due to pneumonia with sepsis and acute kidney injury -Improving -Supportive care  Acute kidney injury -Due to sepsis with admitting creatinine 1.51 -Resolved, creatinine 1.23 on 07/06/2019  Hypokalemia -Replete potassium  Hypertension As needed hydralazine  History of diabetes  mellitus Glucose monitoring with sliding scale insulin as needed   Code Status: Full code DVT Prophylaxis:   SCD's Family Communication: Discussed in detail with son at bedside, all imaging results, lab results explained to son at bedside  Disposition Plan: TBD  Time Spent in minutes 35 minutes  Procedures:    Consultants:     Antimicrobials:      Medications  Scheduled Meds: . aspirin EC  81 mg Oral Daily  . Chlorhexidine Gluconate Cloth  6 each Topical Daily  . enoxaparin (LOVENOX) injection  40 mg Subcutaneous Q24H  . influenza vaccine adjuvanted  0.5 mL Intramuscular Tomorrow-1000  . pantoprazole  20 mg Oral QODAY  . potassium chloride  20 mEq Oral Daily  . senna-docusate  1 tablet Oral Daily  . vitamin B-12  250 mcg Oral Daily   Continuous Infusions: . azithromycin 500 mg (07/05/19 2252)  . ceFEPime (MAXIPIME) IV Stopped (07/06/19 1212)   PRN Meds:.acetaminophen   Antibiotics   Anti-infectives (From admission, onward)   Start     Dose/Rate Route Frequency Ordered Stop   07/06/19 0800  vancomycin (VANCOCIN) IVPB 1000 mg/200 mL premix  Status:  Discontinued     1,000 mg 200 mL/hr over 60 Minutes Intravenous Every 24 hours 07/05/19 0631 07/06/19 1221   07/05/19 1000  ceFEPIme (MAXIPIME) 2  g in sodium chloride 0.9 % 100 mL IVPB     2 g 200 mL/hr over 30 Minutes Intravenous Every 12 hours 07/05/19 0631     07/05/19 0800  vancomycin (VANCOREADY) IVPB 1500 mg/300 mL     1,500 mg 150 mL/hr over 120 Minutes Intravenous  Once 07/05/19 0631 07/05/19 1034   07/05/19 0100  cefTRIAXone (ROCEPHIN) 2 g in sodium chloride 0.9 % 100 mL IVPB  Status:  Discontinued     2 g 200 mL/hr over 30 Minutes Intravenous Daily at bedtime 07/05/19 0049 07/05/19 0625   07/05/19 0100  azithromycin (ZITHROMAX) 500 mg in sodium chloride 0.9 % 250 mL IVPB     500 mg 250 mL/hr over 60 Minutes Intravenous Daily at bedtime 07/05/19 0049          Subjective:   Jeremiah [name] was seen  and examined today.  Unable to volunteer but seems more responsive today.  Blood pressure high, as needed hydralazine ordered  Objective:   Vitals:   07/06/19 0725 07/06/19 1100 07/06/19 1300 07/06/19 1310  BP: (!) 148/92 (!) 176/106 (!) 184/91 (!) 206/91  Pulse: 82 84 92 93  Resp: (!) 21 (!) 27 (!) 28 (!) 25  Temp: 100.2 F (37.9 C)  (!) 100.4 F (38 C)   TempSrc: Axillary     SpO2: 98% 100% 97% 98%  Weight:      Height:        Intake/Output Summary (Last 24 hours) at 07/06/2019 1320 Last data filed at 07/06/2019 1143 Gross per 24 hour  Intake 710 ml  Output 700 ml  Net 10 ml     Wt Readings from Last 3 Encounters:  07/05/19 80 kg  04/02/19 80.3 kg  03/27/19 80.7 kg     Exam  General: NAD  HEENT: NCAT,  PERRL,MMM  Neck: SUPPLE, (-) JVD  Cardiovascular: RRR, (-) GALLOP, (-) MURMUR  Respiratory: Right basilar crackles noted  Gastrointestinal: SOFT, (-) DISTENSION, BS(+), (_) TENDERNESS  Ext: (-) CYANOSIS, (-) EDEMA  Neuro: Alert and responsive but confused     Data Reviewed:  I have personally reviewed following labs and imaging studies  Micro Results Recent Results (from the past 240 hour(s))  Culture, blood (Routine x 2)     Status: None (Preliminary result)   Collection Time: 07/04/19 11:34 PM   Specimen: BLOOD  Result Value Ref Range Status   Specimen Description BLOOD LEFT ANTECUBITAL  Final   Special Requests   Final    BOTTLES DRAWN AEROBIC AND ANAEROBIC Blood Culture results may not be optimal due to an inadequate volume of blood received in culture bottles   Culture   Final    NO GROWTH < 24 HOURS Performed at [facility] [address], [place], [place] [address]    Report Status PENDING  Incomplete  Culture, blood (Routine x 2)     Status: None (Preliminary result)   Collection Time: 07/04/19 11:34 PM   Specimen: BLOOD  Result Value Ref Range Status   Specimen Description BLOOD RIGHT ANTECUBITAL  Final   Special Requests    Final    BOTTLES DRAWN AEROBIC AND ANAEROBIC Blood Culture results may not be optimal due to an excessive volume of blood received in culture bottles   Culture   Final    NO GROWTH < 24 HOURS Performed at [facility], [place] [address] [place], [place] [address]    Report Status PENDING  Incomplete  Urine culture  Status: None   Collection Time: 07/04/19 11:44 PM   Specimen: Urine, Catheterized  Result Value Ref Range Status   Specimen Description URINE, CATHETERIZED  Final   Special Requests NONE  Final   Culture   Final    NO GROWTH Performed at [facility], [address] [place], [place] [address]    Report Status 07/06/2019 FINAL  Final  Respiratory Panel by RT PCR (Flu A&B, Covid) - Nasopharyngeal Swab     Status: None   Collection Time: 07/05/19 12:36 AM   Specimen: Nasopharyngeal Swab  Result Value Ref Range Status   SARS Coronavirus 2 by RT PCR NEGATIVE NEGATIVE Final    Comment: (NOTE) SARS-CoV-2 target nucleic acids are NOT DETECTED. The SARS-CoV-2 RNA is generally detectable in upper respiratoy specimens during the acute phase of infection. The lowest concentration of SARS-CoV-2 viral copies this assay can detect is 131 copies/mL. A negative result does not preclude SARS-Cov-2 infection and should not be used as the sole basis for treatment or other patient management decisions. A negative result may occur with  improper specimen collection/handling, submission of specimen other than nasopharyngeal swab, presence of viral mutation(s) within the areas targeted by this assay, and inadequate number of viral copies (<131 copies/mL). A negative result must be combined with clinical observations, patient history, and epidemiological information. The expected result is Negative. Fact Sheet for Patients:  PinkCheek.be Fact Sheet for Healthcare Providers:  GravelBags.it This test is not yet ap proved  or cleared by the [place] FDA and  has been authorized for detection and/or diagnosis of SARS-CoV-2 by FDA under an Emergency Use Authorization (EUA). This EUA will remain  in effect (meaning this test can be used) for the duration of the COVID-19 declaration under Section 564(b)(1) of the Act, 21 U.S.C. section 360bbb-3(b)(1), unless the authorization is terminated or revoked sooner.    Influenza A by PCR NEGATIVE NEGATIVE Final   Influenza B by PCR NEGATIVE NEGATIVE Final    Comment: (NOTE) The Xpert Xpress SARS-CoV-2/FLU/RSV assay is intended as an aid in  the diagnosis of influenza from Nasopharyngeal swab specimens and  should not be used as a sole basis for treatment. Nasal washings and  aspirates are unacceptable for Xpert Xpress SARS-CoV-2/FLU/RSV  testing. Fact Sheet for Patients: PinkCheek.be Fact Sheet for Healthcare Providers: GravelBags.it This test is not yet approved or cleared by the [place] FDA and  has been authorized for detection and/or diagnosis of SARS-CoV-2 by  FDA under an Emergency Use Authorization (EUA). This EUA will remain  in effect (meaning this test can be used) for the duration of the  Covid-19 declaration under Section 564(b)(1) of the Act, 21  U.S.C. section 360bbb-3(b)(1), unless the authorization is  terminated or revoked. Performed at [facility], [place] [address] [place], [place] [address]   MRSA PCR Screening     Status: None   Collection Time: 07/05/19  6:11 AM   Specimen: Nasal Mucosa; Nasopharyngeal  Result Value Ref Range Status   MRSA by PCR NEGATIVE NEGATIVE Final    Comment:        The GeneXpert MRSA Assay (FDA approved for NASAL specimens only), is one component of a comprehensive MRSA colonization surveillance program. It is not intended to diagnose MRSA infection nor to guide or monitor treatment for MRSA infections. Performed at [facility], [place] [address], [place], [place] [address]     Radiology Reports CT Head Wo Contrast  Result  Date: 07/05/2019 CLINICAL DATA:  Found on floor, unable to follow commands, lethargic EXAM: CT HEAD WITHOUT CONTRAST CT CERVICAL SPINE WITHOUT CONTRAST TECHNIQUE: Multidetector CT imaging of the head and cervical spine was performed following the standard protocol without intravenous contrast. Multiplanar CT image reconstructions of the cervical spine were also generated. COMPARISON:  CT head 05/14/2019, MRI 06/16/2012, FINDINGS: CT HEAD FINDINGS Brain: Stable positioning of a right parietal approach shunt catheter crossing midline terminating in the body of the left lateral ventricle. Reservoir in the posterior parietal scalp. No catheter discontinuity is seen is a courses towards the base of the right neck. Ventricular caliber is unchanged from prior. Symmetric prominence of the cisterns and sulci compatible with a background of parenchymal volume loss. Patchy areas of white matter hypoattenuation are most compatible with chronic microvascular angiopathy. No evidence of acute infarction, hemorrhage, hydrocephalus, extra-axial collection or mass lesion/mass effect. Vascular: No hyperdense vessel or unexpected calcification. Skull: No calvarial fracture or suspicious osseous lesion. No scalp swelling or hematoma. Sinuses/Orbits: Minimal mural thickening within the ethmoids. Paranasal sinuses are otherwise predominantly clear. Mastoid air cells are clear. Chronic nasal bone deformities are again noted. Included orbital structures are unremarkable. Other: None CT CERVICAL SPINE FINDINGS Alignment: Slight exaggeration of the cervical lordosis. No traumatic listhesis. No abnormally widened, perched or jumped facets. Craniocervical and atlantoaxial articulations are normally aligned. Mild left lateral neck flexion and rightward head rotation. Skull base and vertebrae: No acute fracture. No primary bone lesion or focal  pathologic process. Soft tissues and spinal canal: No pre or paravertebral fluid or swelling. No visible canal hematoma. Benign enthesopathic calcifications in the nuchal ligament. Intact catheter tubing coursing along the right lateral neck to the anterior chest wall. No shunt catheter discontinuity is seen. Disc levels: Multilevel intervertebral disc height loss with spondylitic endplate changes. Features are maximal and C5-6 with a posterior disc osteophyte complex resulting in mild canal stenosis. Uncinate spurring and facet hypertrophic changes result in mild to moderate multilevel foraminal narrowing more pronounced on the right and left. Upper chest: No acute abnormality in the upper chest or imaged lung apices. Other: Cervical carotid atherosclerosis. Normal thyroid. IMPRESSION: 1. No acute intracranial findings. No calvarial fracture or significant scalp swelling. 2. Stable positioning of a right parietal approach shunt catheter. No evidence of shunt catheter discontinuity. 3. Stable mild ventricular dilatation. 4. Chronic microvascular ischemic changes and parenchymal volume loss. 5. No acute fracture or traumatic listhesis of the cervical spine. 6. Multilevel disc and facet degeneration. Features maximal at C5-6. Electronically Signed   By: Lovena Le M.D.   On: 07/05/2019 00:40   CT ANGIO CHEST PE W OR WO CONTRAST  Result Date: 07/05/2019 CLINICAL DATA:  Elevated D-dimer. Tachycardia. Possible pulmonary emboli. EXAM: CT ANGIOGRAPHY CHEST WITH CONTRAST TECHNIQUE: Multidetector CT imaging of the chest was performed using the standard protocol during bolus administration of intravenous contrast. Multiplanar CT image reconstructions and MIPs were obtained to evaluate the vascular anatomy. CONTRAST:  12mL OMNIPAQUE IOHEXOL 350 MG/ML SOLN COMPARISON:  Chest radiography same day.  Abdominal CT 10/24/2018. FINDINGS: Cardiovascular: Pulmonary arterial opacification is good. There are no pulmonary emboli.  Heart size upper limits of normal. Coronary artery calcification is present. There is aortic atherosclerosis. Mediastinum/Nodes: No mediastinal or hilar mass or lymphadenopathy. Lungs/Pleura: Chronic elevation of the right hemidiaphragm with volume loss and or infiltrate in the right lower lobe. Lesser volume loss/infiltrate also in the left lower lobe. No evidence of pleural fluid. Upper Abdomen: No acute abdominal finding. Elevated  right hemidiaphragm as noted above, with interposition of the bowel between the liver and the diaphragm. Musculoskeletal: Negative Review of the MIP images confirms the above findings. IMPRESSION: No pulmonary emboli. Coronary artery calcification.  Aortic atherosclerosis. Elevation of the right hemidiaphragm. Atelectasis/infiltrate in both lower lobes, right worse than left. Some degree of this is chronic, but it appears more pronounced than was noted on an abdominal CT of 10/24/2018. Electronically Signed   By: Nelson Chimes M.D.   On: 07/05/2019 05:32   CT Cervical Spine Wo Contrast  Result Date: 07/05/2019 CLINICAL DATA:  Found on floor, unable to follow commands, lethargic EXAM: CT HEAD WITHOUT CONTRAST CT CERVICAL SPINE WITHOUT CONTRAST TECHNIQUE: Multidetector CT imaging of the head and cervical spine was performed following the standard protocol without intravenous contrast. Multiplanar CT image reconstructions of the cervical spine were also generated. COMPARISON:  CT head 05/14/2019, MRI 06/16/2012, FINDINGS: CT HEAD FINDINGS Brain: Stable positioning of a right parietal approach shunt catheter crossing midline terminating in the body of the left lateral ventricle. Reservoir in the posterior parietal scalp. No catheter discontinuity is seen is a courses towards the base of the right neck. Ventricular caliber is unchanged from prior. Symmetric prominence of the cisterns and sulci compatible with a background of parenchymal volume loss. Patchy areas of white matter  hypoattenuation are most compatible with chronic microvascular angiopathy. No evidence of acute infarction, hemorrhage, hydrocephalus, extra-axial collection or mass lesion/mass effect. Vascular: No hyperdense vessel or unexpected calcification. Skull: No calvarial fracture or suspicious osseous lesion. No scalp swelling or hematoma. Sinuses/Orbits: Minimal mural thickening within the ethmoids. Paranasal sinuses are otherwise predominantly clear. Mastoid air cells are clear. Chronic nasal bone deformities are again noted. Included orbital structures are unremarkable. Other: None CT CERVICAL SPINE FINDINGS Alignment: Slight exaggeration of the cervical lordosis. No traumatic listhesis. No abnormally widened, perched or jumped facets. Craniocervical and atlantoaxial articulations are normally aligned. Mild left lateral neck flexion and rightward head rotation. Skull base and vertebrae: No acute fracture. No primary bone lesion or focal pathologic process. Soft tissues and spinal canal: No pre or paravertebral fluid or swelling. No visible canal hematoma. Benign enthesopathic calcifications in the nuchal ligament. Intact catheter tubing coursing along the right lateral neck to the anterior chest wall. No shunt catheter discontinuity is seen. Disc levels: Multilevel intervertebral disc height loss with spondylitic endplate changes. Features are maximal and C5-6 with a posterior disc osteophyte complex resulting in mild canal stenosis. Uncinate spurring and facet hypertrophic changes result in mild to moderate multilevel foraminal narrowing more pronounced on the right and left. Upper chest: No acute abnormality in the upper chest or imaged lung apices. Other: Cervical carotid atherosclerosis. Normal thyroid. IMPRESSION: 1. No acute intracranial findings. No calvarial fracture or significant scalp swelling. 2. Stable positioning of a right parietal approach shunt catheter. No evidence of shunt catheter discontinuity. 3.  Stable mild ventricular dilatation. 4. Chronic microvascular ischemic changes and parenchymal volume loss. 5. No acute fracture or traumatic listhesis of the cervical spine. 6. Multilevel disc and facet degeneration. Features maximal at C5-6. Electronically Signed   By: Lovena Le M.D.   On: 07/05/2019 00:40   DG Chest Portable 1 View  Result Date: 07/05/2019 CLINICAL DATA:  Fever. Lethargy. Hypoxia. EXAM: PORTABLE CHEST 1 VIEW COMPARISON:  Most recent radiograph 03/27/2019 FINDINGS: Persistent low lung volumes. Chronic elevation of right hemidiaphragm unchanged heart size and mediastinal contours. Heterogeneous streaky bibasilar opacities, with slight patchy opacities in the left mid lung. No pleural  fluid or pneumothorax. Shunt catheter tubing projects over the right neck and chest wall. IMPRESSION: Low lung volumes with streaky bibasilar and patchy left midlung opacities. Findings are suspicious for pneumonia in the setting of fever. Electronically Signed   By: Keith Rake M.D.   On: 07/05/2019 00:10   ECHOCARDIOGRAM COMPLETE  Result Date: 07/05/2019    ECHOCARDIOGRAM REPORT   Patient Name:   Naven KINSER FELLMAN Date of Exam: 07/05/2019 Medical Rec #:  [id number]       Height:       69.0 in Accession #:    [id number]      Weight:       176.4 lb Date of Birth:  Jun 14, 1940       BSA:          1.96 m Patient Age:    45 years        BP:           140/91 mmHg Patient Gender: M               HR:           101 bpm. Exam Location:  Inpatient Procedure: 2D Echo, Color Doppler and Cardiac Doppler Indications:    R00.0 Tachycardia  History:        Patient has no prior history of Echocardiogram examinations.                 Risk Factors:Hypertension, Diabetes, Dyslipidemia and Sleep                 Apnea.  Sonographer:    Raquel Sarna Senior RDCS Referring Phys: [address]  Sonographer Comments: Technically difficult due to altered mental status. IMPRESSIONS  1. Left ventricular ejection fraction, by estimation, is 60  to 65%. The left ventricle has normal function. The left ventricle has no regional wall motion abnormalities. There is mildly increased left ventricular hypertrophy. Left ventricular diastolic parameters are indeterminate.  2. Right ventricular systolic function is moderately reduced. The right ventricular size is mildly enlarged. There is mildly elevated pulmonary artery systolic pressure.  3. Right atrial size was mildly dilated.  4. No pericardial effusion prominent epicardial adipose tissue.  5. The mitral valve is normal in structure and function. No evidence of mitral valve regurgitation. No evidence of mitral stenosis.  6. The aortic valve is normal in structure and function. Aortic valve regurgitation is not visualized. No aortic stenosis is present.  7. Aortic dilatation noted. There is mild dilatation of the aortic root measuring 39 mm.  8. The inferior vena cava is normal in size with greater than 50% respiratory variability, suggesting right atrial pressure of 3 mmHg. FINDINGS  Left Ventricle: Left ventricular ejection fraction, by estimation, is 60 to 65%. The left ventricle has normal function. The left ventricle has no regional wall motion abnormalities. The left ventricular internal cavity size was normal in size. There is  mildly increased left ventricular hypertrophy. Left ventricular diastolic parameters are indeterminate. Right Ventricle: The right ventricular size is mildly enlarged. No increase in right ventricular wall thickness. Right ventricular systolic function is moderately reduced. There is mildly elevated pulmonary artery systolic pressure. The tricuspid regurgitant velocity is 2.62 m/s, and with an assumed right atrial pressure of 8 mmHg, the estimated right ventricular systolic pressure is 03.0 mmHg. Left Atrium: Left atrial size was normal in size. Right Atrium: Right atrial size was mildly dilated. Pericardium: No pericardial effusion prominent epicardial adipose tissue. There is no  evidence of  pericardial effusion. Mitral Valve: The mitral valve is normal in structure and function. Normal mobility of the mitral valve leaflets. No evidence of mitral valve regurgitation. No evidence of mitral valve stenosis. Tricuspid Valve: The tricuspid valve is normal in structure. Tricuspid valve regurgitation is mild . No evidence of tricuspid stenosis. Aortic Valve: The aortic valve is normal in structure and function. Aortic valve regurgitation is not visualized. No aortic stenosis is present. Pulmonic Valve: The pulmonic valve was normal in structure. Pulmonic valve regurgitation is trivial. No evidence of pulmonic stenosis. Aorta: The aortic root is normal in size and structure and aortic dilatation noted. There is mild dilatation of the aortic root measuring 39 mm. Venous: The inferior vena cava is normal in size with greater than 50% respiratory variability, suggesting right atrial pressure of 3 mmHg. IAS/Shunts: No atrial level shunt detected by color flow Doppler.  LEFT VENTRICLE PLAX 2D LVIDd:         4.30 cm LVIDs:         2.70 cm LV PW:         1.10 cm LV IVS:        1.30 cm LVOT diam:     1.90 cm LV SV:         77.40 ml LV SV Index:   28.23 LVOT Area:     2.84 cm  RIGHT VENTRICLE RV S prime:     10.30 cm/s TAPSE (M-mode): 2.1 cm LEFT ATRIUM             Index       RIGHT ATRIUM           Index LA diam:        3.70 cm 1.89 cm/m  RA Area:     19.50 cm LA Vol (A2C):   34.3 ml 17.51 ml/m RA Volume:   51.90 ml  26.50 ml/m LA Vol (A4C):   17.0 ml 8.68 ml/m LA Biplane Vol: 25.8 ml 13.17 ml/m  AORTIC VALVE LVOT Vmax:   139.00 cm/s LVOT Vmean:  99.400 cm/s LVOT VTI:    0.273 m  AORTA Ao Root diam: 3.60 cm Ao Asc diam:  3.90 cm TRICUSPID VALVE TR Peak grad:   27.5 mmHg TR Vmax:        262.00 cm/s  SHUNTS Systemic VTI:  0.27 m Systemic Diam: 1.90 cm Jenkins Rouge MD Electronically signed by Jenkins Rouge MD Signature Date/Time: 07/05/2019/12:15:47 PM    Final     Lab Data:  CBC: Recent Labs  Lab  07/04/19 2332 07/05/19 0700 07/06/19 0248  WBC 13.0* 17.0* 11.6*  NEUTROABS 12.1*  --   --   HGB 16.5 14.6 13.0  HCT 51.1 43.8 40.4  MCV 96.6 94.2 95.7  PLT 175 156 914   Basic Metabolic Panel: Recent Labs  Lab 07/04/19 2332 07/05/19 0700 07/06/19 0248  NA 143 141 142  K 2.9* 3.4* 3.1*  CL 103 102 102  CO2 25 25 27   GLUCOSE 192* 210* 129*  BUN 17 16 16   CREATININE 1.51* 1.41* 1.23  CALCIUM 9.0 8.5* 8.5*   GFR: Estimated Creatinine Clearance: 48.7 mL/min (by C-G formula based on SCr of 1.23 mg/dL). Liver Function Tests: Recent Labs  Lab 07/04/19 2332 07/05/19 0700 07/06/19 0248  AST 21 25 57*  ALT 11 11 12   ALKPHOS 53 40 36*  BILITOT 1.2 0.7 1.4*  PROT 6.8 5.6* 5.4*  ALBUMIN 3.8 3.0* 2.9*   No results for input(s): LIPASE, AMYLASE in the  last 168 hours. Recent Labs  Lab 07/05/19 0700  AMMONIA 26   Coagulation Profile: Recent Labs  Lab 07/04/19 2332  INR 1.0   Cardiac Enzymes: Recent Labs  Lab 07/04/19 2332  CKTOTAL 44*   BNP (last 3 results) No results for input(s): PROBNP in the last 8760 hours. HbA1C: No results for input(s): HGBA1C in the last 72 hours. CBG: No results for input(s): GLUCAP in the last 168 hours. Lipid Profile: No results for input(s): CHOL, HDL, LDLCALC, TRIG, CHOLHDL, LDLDIRECT in the last 72 hours. Thyroid Function Tests: Recent Labs    07/05/19 0700  TSH 0.513   Anemia Panel: Recent Labs    07/05/19 0700  VITAMINB12 262   Urine analysis:    Component Value Date/Time   COLORURINE YELLOW 07/04/2019 2332   APPEARANCEUR CLEAR 07/04/2019 2332   LABSPEC 1.014 07/04/2019 2332   PHURINE 6.0 07/04/2019 2332   GLUCOSEU 50 (A) 07/04/2019 2332   HGBUR SMALL (A) 07/04/2019 2332   BILIRUBINUR NEGATIVE 07/04/2019 2332   KETONESUR 5 (A) 07/04/2019 2332   PROTEINUR NEGATIVE 07/04/2019 2332   UROBILINOGEN 1.0 08/25/2008 0326   NITRITE NEGATIVE 07/04/2019 2332   LEUKOCYTESUR NEGATIVE 07/04/2019 2332     Benito Mccreedy M.D. [facility] 07/06/2019, 1:20 PM  Pager: [phone] Between 7am to 7pm - call Pager - [phone]  After 7pm go to [email or web address] - password [email or web address]  Call night coverage person covering after 7pm

## 2019-07-07 ENCOUNTER — Inpatient Hospital Stay (HOSPITAL_COMMUNITY): Payer: Medicare Other

## 2019-07-07 DIAGNOSIS — A419 Sepsis, unspecified organism: Principal | ICD-10-CM

## 2019-07-07 DIAGNOSIS — R410 Disorientation, unspecified: Secondary | ICD-10-CM

## 2019-07-07 DIAGNOSIS — R6520 Severe sepsis without septic shock: Secondary | ICD-10-CM

## 2019-07-07 DIAGNOSIS — E119 Type 2 diabetes mellitus without complications: Secondary | ICD-10-CM

## 2019-07-07 DIAGNOSIS — G9340 Encephalopathy, unspecified: Secondary | ICD-10-CM

## 2019-07-07 DIAGNOSIS — I10 Essential (primary) hypertension: Secondary | ICD-10-CM

## 2019-07-07 LAB — COMPREHENSIVE METABOLIC PANEL
ALT: 15 U/L (ref 0–44)
AST: 63 U/L — ABNORMAL HIGH (ref 15–41)
Albumin: 3 g/dL — ABNORMAL LOW (ref 3.5–5.0)
Alkaline Phosphatase: 41 U/L (ref 38–126)
Anion gap: 9 (ref 5–15)
BUN: 17 mg/dL (ref 8–23)
CO2: 28 mmol/L (ref 22–32)
Calcium: 8.9 mg/dL (ref 8.9–10.3)
Chloride: 105 mmol/L (ref 98–111)
Creatinine, Ser: 1.14 mg/dL (ref 0.61–1.24)
GFR calc Af Amer: >60 mL/min (ref >60)
GFR calc non Af Amer: >60 mL/min (ref >60)
Glucose, Bld: 111 mg/dL — ABNORMAL HIGH (ref 70–99)
Potassium: 3.8 mmol/L (ref 3.5–5.1)
Sodium: 142 mmol/L (ref 135–145)
Total Bilirubin: 1.7 mg/dL — ABNORMAL HIGH (ref 0.3–1.2)
Total Protein: 6.2 g/dL — ABNORMAL LOW (ref 6.5–8.1)

## 2019-07-07 LAB — CBC
HCT: 44.5 % (ref 39.0–52.0)
Hemoglobin: 14.6 g/dL (ref 13.0–17.0)
MCH: 30.9 pg (ref 26.0–34.0)
MCHC: 32.8 g/dL (ref 30.0–36.0)
MCV: 94.3 fL (ref 80.0–100.0)
Platelets: 159 10*3/uL (ref 150–400)
RBC: 4.72 MIL/uL (ref 4.22–5.81)
RDW: 12.9 % (ref 11.5–15.5)
WBC: 11.6 10*3/uL — ABNORMAL HIGH (ref 4.0–10.5)
nRBC: 0 % (ref 0.0–0.2)

## 2019-07-07 LAB — GLUCOSE, CAPILLARY: Glucose-Capillary: 102 mg/dL — ABNORMAL HIGH (ref 70–99)

## 2019-07-07 LAB — LEGIONELLA PNEUMOPHILA SEROGP 1 UR AG: L. pneumophila Serogp 1 Ur Ag: NEGATIVE

## 2019-07-07 LAB — HEMOGLOBIN A1C
Hgb A1c MFr Bld: 6 % — ABNORMAL HIGH (ref 4.8–5.6)
Mean Plasma Glucose: 125.5 mg/dL

## 2019-07-07 MED ORDER — VITAMIN B-12 1000 MCG PO TABS
1000.0000 ug | ORAL_TABLET | Freq: Every day | ORAL | Status: DC
  Administered 2019-07-07 – 2019-07-15 (×9): 1000 ug via ORAL
  Filled 2019-07-07 (×9): qty 1

## 2019-07-07 MED ORDER — AMLODIPINE BESYLATE 5 MG PO TABS
2.5000 mg | ORAL_TABLET | Freq: Every day | ORAL | Status: DC
  Administered 2019-07-07 – 2019-07-12 (×5): 2.5 mg via ORAL
  Filled 2019-07-07 (×6): qty 1

## 2019-07-07 MED ORDER — SODIUM CHLORIDE 0.9 % IV SOLN
1.5000 g | Freq: Four times a day (QID) | INTRAVENOUS | Status: AC
  Administered 2019-07-07 – 2019-07-12 (×22): 1.5 g via INTRAVENOUS
  Filled 2019-07-07: qty 4
  Filled 2019-07-07: qty 1.5
  Filled 2019-07-07: qty 4
  Filled 2019-07-07: qty 1.5
  Filled 2019-07-07 (×2): qty 4
  Filled 2019-07-07 (×3): qty 1.5
  Filled 2019-07-07 (×3): qty 4
  Filled 2019-07-07: qty 1.5
  Filled 2019-07-07: qty 4
  Filled 2019-07-07 (×4): qty 1.5
  Filled 2019-07-07: qty 4
  Filled 2019-07-07: qty 1.5
  Filled 2019-07-07: qty 4
  Filled 2019-07-07 (×2): qty 1.5
  Filled 2019-07-07 (×2): qty 4

## 2019-07-07 MED ORDER — DEXTROSE IN LACTATED RINGERS 5 % IV SOLN: INTRAVENOUS | Status: DC

## 2019-07-07 MED ORDER — HALOPERIDOL LACTATE 5 MG/ML IJ SOLN: 2.0000 mg | Freq: Once | INTRAMUSCULAR | Status: DC

## 2019-07-07 MED ORDER — QUETIAPINE FUMARATE 25 MG PO TABS
25.0000 mg | ORAL_TABLET | Freq: Every day | ORAL | Status: DC
  Administered 2019-07-07: 25 mg via ORAL
  Filled 2019-07-07: qty 1

## 2019-07-07 MED ORDER — ENSURE ENLIVE PO LIQD
237.0000 mL | Freq: Two times a day (BID) | ORAL | Status: DC
  Administered 2019-07-08 – 2019-07-15 (×13): 237 mL via ORAL

## 2019-07-07 MED ORDER — DIPHENHYDRAMINE HCL 50 MG/ML IJ SOLN
12.5000 mg | Freq: Once | INTRAMUSCULAR | Status: DC
  Filled 2019-07-07: qty 1

## 2019-07-07 MED ORDER — PRO-STAT SUGAR FREE PO LIQD
30.0000 mL | Freq: Every day | ORAL | Status: DC
  Administered 2019-07-08 – 2019-07-15 (×8): 30 mL via ORAL
  Filled 2019-07-07 (×8): qty 30

## 2019-07-07 NOTE — Plan of Care (Signed)
  Problem: Clinical Measurements: Goal: Respiratory complications will improve Outcome: Progressing Problem: Respiratory: Goal: Ability to maintain adequate ventilation will improve Outcome: Progressing   Titrate O2 as appropriate, Down to 1LNC since this AM. Encourage coughing and deep breathing.    Problem: Elimination: Goal: Will not experience complications related to bowel motility Outcome: Progressing Goal: Will not experience complications related to urinary retention Outcome: Progressing  Male purewick in place. Monitor for incontinence staying on skin.

## 2019-07-07 NOTE — Progress Notes (Signed)
Initial Nutrition Assessment  DOCUMENTATION CODES:   Not applicable  INTERVENTION:  Provide Ensure Enlive po BID, each supplement provides 350 kcal and 20 grams of protein  Provide 30 ml Prostat po once daily, each supplement provides 100 kcal and 15 grams of protein.   Encourage adequate PO intake.   NUTRITION DIAGNOSIS:   Increased nutrient needs related to acute illness(sepsis PNA) as evidenced by estimated needs.  GOAL:   Patient will meet greater than or equal to 90% of their needs  MONITOR:   PO intake, Supplement acceptance, Skin, Weight trends, Labs, I & O's  REASON FOR ASSESSMENT:   Consult Assessment of nutrition requirement/status  ASSESSMENT:   79 y.o. male,  medical history including but not limited to hypertension, Dm2, prostate cancer s/p XRT, OSA on CPAP, NPH s/p VP shunt, presenting with altered mental status associated with fever, tachycardia, hypoxia and tachypnea after being found on the floor.  Chest x-ray noted acute infiltrate.Patient admitted for acute hypoxic respiratory failure due to pneumonia with secondary sepsis of pulmonary etiology.  Pt unavailable during attempted time of contact. RD unable to obtain pt nutrition history. Mental status improving per MD. Pt with no significant weight loss per weight records. RD to order nutritional supplements to aid in caloric and protein needs.   Unable to complete Nutrition-Focused physical exam at this time.   Labs and medications reviewed.   Diet Order:   Diet Order            Diet Carb Modified Fluid consistency: Thin; Room service appropriate? No  Diet effective now              EDUCATION NEEDS:   Not appropriate for education at this time  Skin:  Skin Assessment: Reviewed RN Assessment  Last BM:  2/14  Height:   Ht Readings from Last 1 Encounters:  07/05/19 5\' 9"  (1.753 m)    Weight:   Wt Readings from Last 1 Encounters:  07/05/19 80 kg    Ideal Body Weight:  72.7 kg  BMI:   Body mass index is 26.05 kg/m.  Estimated Nutritional Needs:   Kcal:  2000-2200  Protein:  100-110 grams  Fluid:  >/= 2 L/day    Corrin Parker, MS, RD, LDN Pager # [phone] After hours/ weekend pager # [phone]

## 2019-07-07 NOTE — Progress Notes (Signed)
PROGRESS NOTE    Jeremiah [name]  IFO:[id number]  DOB: [birth year]  PCP: [name], MD Admit date:07/04/2019  78 y.o.male, medical history including but not limited to hypertension, Dm2, prostate cancer s/p XRT, OSA on CPAP, NPH s/p VP shunt, presenting with altered mental status associated with fever, tachycardia, hypoxia and tachypnea after being found on the floor.Labs with AKI,creatinine 1.51 and hypokalemia .CT C-spine and head negative admission. Chest x-ray noted acute infiltrate.CT angiogram 07/05/2019-negative PE-bilateral lower lobe atelectasis versus infiltrate.  Hospital course: Patient admitted to [facility] for acute hypoxic respiratory failure and sepsis due to presumed pneumonia with IV antibiotics cefepime with Zithromax -MRSA PCR negative and therefore vancomycin discontinued   Subjective:  Met with son at bedside who is concerned about persistent delirium, patient pulling on IV lines.  According to son, patient at baseline is quite "sharp".  He does have history of NPH but not very ataxic usually.  Apparently patient's daughter and wife went out and when returned home, they found patient on the floor not knowing how he got there.  He appeared to have vomited.  He was apparently doing well and at baseline before they went out.  No report of family noting fever or chills or diarrhea or cough preceding the episode.  Patient has video monitor in the room but son concerned that he is not able to be redirected.  Patient awake and mumbles to questions.   Objective: Vitals:   07/07/19 0617 07/07/19 0936 07/07/19 0950 07/07/19 1003  BP:  (!) 178/97  (!) 148/83  Pulse: 87 81    Resp: 18 (!) 22    Temp:  99.3 F (37.4 C)    TempSrc:  Axillary    SpO2: 100% 100% 100%   Weight:      Height:        Intake/Output Summary (Last 24 hours) at 07/07/2019 1037 Last data filed at 07/07/2019 6767 Gross per 24 hour  Intake 777.2 ml  Output 1250 ml  Net -472.8 ml   Filed Weights   07/05/19  0019 07/05/19 1209  Weight: 80 kg 80 kg    Physical Examination:  General exam: Appears tired and somnolent, fidgets with mittens when woken up Respiratory system: Clear to auscultation. Respiratory effort normal. Cardiovascular system: S1 & S2 heard, RRR. No JVD, murmurs, rubs, gallops or clicks. No pedal edema. Gastrointestinal system: Abdomen is nondistended, soft and nontender. Normal bowel sounds heard. Central nervous system: Somnolent but easily arousable, confused, moving all extremities.   Extremities: No contractures, edema or joint deformities.  Skin: No rashes, lesions or ulcers Psychiatry: Judgement and insight appear normal. Mood & affect appropriate.   Data Reviewed: I have personally reviewed following labs and imaging studies  CBC: Recent Labs  Lab 07/04/19 2332 07/05/19 0700 07/06/19 0248 07/07/19 0255  WBC 13.0* 17.0* 11.6* 11.6*  NEUTROABS 12.1*  --   --   --   HGB 16.5 14.6 13.0 14.6  HCT 51.1 43.8 40.4 44.5  MCV 96.6 94.2 95.7 94.3  PLT 175 156 155 209   Basic Metabolic Panel: Recent Labs  Lab 07/04/19 2332 07/05/19 0700 07/06/19 0248 07/07/19 0255  NA 143 141 142 142  K 2.9* 3.4* 3.1* 3.8  CL 103 102 102 105  CO2 _0 GLUCOSE 192* 210* 129* 111*  BUN _1 CREATININE 1.51* 1.41* 1.23 1.14  CALCIUM 9.0 8.5* 8.5* 8.9   GFR: Estimated Creatinine Clearance: 52.5 mL/min (by C-G formula based on  SCr of 1.14 mg/dL). Liver Function Tests: Recent Labs  Lab 07/04/19 2332 07/05/19 0700 07/06/19 0248 07/07/19 0255  AST 21 25 57* 63*  ALT _0 ALKPHOS 53 40 36* 41  BILITOT 1.2 0.7 1.4* 1.7*  PROT 6.8 5.6* 5.4* 6.2*  ALBUMIN 3.8 3.0* 2.9* 3.0*   No results for input(s): LIPASE, AMYLASE in the last 168 hours. Recent Labs  Lab 07/05/19 0700  AMMONIA 26   Coagulation Profile: Recent Labs  Lab 07/04/19 2332  INR 1.0   Cardiac Enzymes: Recent Labs  Lab 07/04/19 2332  CKTOTAL 44*   BNP (last 3 results) No  results for input(s): PROBNP in the last 8760 hours. HbA1C: No results for input(s): HGBA1C in the last 72 hours. CBG: No results for input(s): GLUCAP in the last 168 hours. Lipid Profile: No results for input(s): CHOL, HDL, LDLCALC, TRIG, CHOLHDL, LDLDIRECT in the last 72 hours. Thyroid Function Tests: Recent Labs    07/05/19 0700  TSH 0.513   Anemia Panel: Recent Labs    07/05/19 0700  VITAMINB12 262   Sepsis Labs: Recent Labs  Lab 07/04/19 2332 07/05/19 0215 07/05/19 0700  LATICACIDVEN 2.6* 4.8* 3.7*    Recent Results (from the past 240 hour(s))  Culture, blood (Routine x 2)     Status: None (Preliminary result)   Collection Time: 07/04/19 11:34 PM   Specimen: BLOOD  Result Value Ref Range Status   Specimen Description BLOOD LEFT ANTECUBITAL  Final   Special Requests   Final    BOTTLES DRAWN AEROBIC AND ANAEROBIC Blood Culture results may not be optimal due to an inadequate volume of blood received in culture bottles   Culture   Final    NO GROWTH 2 DAYS Performed at [facility] [address] [place], [place] [address]    Report Status PENDING  Incomplete  Culture, blood (Routine x 2)     Status: None (Preliminary result)   Collection Time: 07/04/19 11:34 PM   Specimen: BLOOD  Result Value Ref Range Status   Specimen Description BLOOD RIGHT ANTECUBITAL  Final   Special Requests   Final    BOTTLES DRAWN AEROBIC AND ANAEROBIC Blood Culture results may not be optimal due to an excessive volume of blood received in culture bottles   Culture   Final    NO GROWTH 2 DAYS Performed at [facility], [place] [address], [place], [place] [address]    Report Status PENDING  Incomplete  Urine culture     Status: None   Collection Time: 07/04/19 11:44 PM   Specimen: Urine, Catheterized  Result Value Ref Range Status   Specimen Description URINE, CATHETERIZED  Final   Special Requests NONE  Final   Culture   Final    NO GROWTH Performed at [facility], [address] [place], [place] [address]    Report Status 07/06/2019 FINAL  Final  Respiratory Panel by RT PCR (Flu A&B, Covid) - Nasopharyngeal Swab     Status: None   Collection Time: 07/05/19 12:36 AM   Specimen: Nasopharyngeal Swab  Result Value Ref Range Status   SARS Coronavirus 2 by RT PCR NEGATIVE NEGATIVE Final    Comment: (NOTE) SARS-CoV-2 target nucleic acids are NOT DETECTED. The SARS-CoV-2 RNA is generally detectable in upper respiratoy specimens during the acute phase of infection. The lowest concentration of SARS-CoV-2 viral copies this assay can detect is 131 copies/mL. A negative result does not preclude  SARS-Cov-2 infection and should not be used as the sole basis for treatment or other patient management decisions. A negative result may occur with  improper specimen collection/handling, submission of specimen other than nasopharyngeal swab, presence of viral mutation(s) within the areas targeted by this assay, and inadequate number of viral copies (<131 copies/mL). A negative result must be combined with clinical observations, patient history, and epidemiological information. The expected result is Negative. Fact Sheet for Patients:  PinkCheek.be Fact Sheet for Healthcare Providers:  GravelBags.it This test is not yet ap proved or cleared by the [place] FDA and  has been authorized for detection and/or diagnosis of SARS-CoV-2 by FDA under an Emergency Use Authorization (EUA). This EUA will remain  in effect (meaning this test can be used) for the duration of the COVID-19 declaration under Section 564(b)(1) of the Act, 21 U.S.C. section 360bbb-3(b)(1), unless the authorization is terminated or revoked sooner.    Influenza A by PCR NEGATIVE NEGATIVE Final   Influenza B by PCR NEGATIVE NEGATIVE Final    Comment: (NOTE) The Xpert Xpress SARS-CoV-2/FLU/RSV assay is intended as an aid in  the  diagnosis of influenza from Nasopharyngeal swab specimens and  should not be used as a sole basis for treatment. Nasal washings and  aspirates are unacceptable for Xpert Xpress SARS-CoV-2/FLU/RSV  testing. Fact Sheet for Patients: PinkCheek.be Fact Sheet for Healthcare Providers: GravelBags.it This test is not yet approved or cleared by the [place] FDA and  has been authorized for detection and/or diagnosis of SARS-CoV-2 by  FDA under an Emergency Use Authorization (EUA). This EUA will remain  in effect (meaning this test can be used) for the duration of the  Covid-19 declaration under Section 564(b)(1) of the Act, 21  U.S.C. section 360bbb-3(b)(1), unless the authorization is  terminated or revoked. Performed at [facility], [place] [address] [place], [place] [address]   MRSA PCR Screening     Status: None   Collection Time: 07/05/19  6:11 AM   Specimen: Nasal Mucosa; Nasopharyngeal  Result Value Ref Range Status   MRSA by PCR NEGATIVE NEGATIVE Final    Comment:        The GeneXpert MRSA Assay (FDA approved for NASAL specimens only), is one component of a comprehensive MRSA colonization surveillance program. It is not intended to diagnose MRSA infection nor to guide or monitor treatment for MRSA infections. Performed at [facility], [place] [address] [place], [place] [address]       Radiology Studies: ECHOCARDIOGRAM COMPLETE  Result Date: 07/05/2019    ECHOCARDIOGRAM REPORT   Patient Name:   Jeremiah [name] Date of Exam: 07/05/2019 Medical Rec #:  [id number]       Height:       69.0 in Accession #:    [id number]      Weight:       176.4 lb Date of Birth:  11/27/1940       BSA:          1.96 m Patient Age:    19 years        BP:           140/91 mmHg Patient Gender: M               HR:           101 bpm. Exam Location:  Inpatient Procedure: 2D Echo, Color Doppler and Cardiac Doppler Indications:    R00.0  Tachycardia  History:  Patient has no prior history of Echocardiogram examinations.                 Risk Factors:Hypertension, Diabetes, Dyslipidemia and Sleep                 Apnea.  Sonographer:    Raquel Sarna Senior RDCS Referring Phys: [id number]  Sonographer Comments: Technically difficult due to altered mental status. IMPRESSIONS  1. Left ventricular ejection fraction, by estimation, is 60 to 65%. The left ventricle has normal function. The left ventricle has no regional wall motion abnormalities. There is mildly increased left ventricular hypertrophy. Left ventricular diastolic parameters are indeterminate.  2. Right ventricular systolic function is moderately reduced. The right ventricular size is mildly enlarged. There is mildly elevated pulmonary artery systolic pressure.  3. Right atrial size was mildly dilated.  4. No pericardial effusion prominent epicardial adipose tissue.  5. The mitral valve is normal in structure and function. No evidence of mitral valve regurgitation. No evidence of mitral stenosis.  6. The aortic valve is normal in structure and function. Aortic valve regurgitation is not visualized. No aortic stenosis is present.  7. Aortic dilatation noted. There is mild dilatation of the aortic root measuring 39 mm.  8. The inferior vena cava is normal in size with greater than 50% respiratory variability, suggesting right atrial pressure of 3 mmHg. FINDINGS  Left Ventricle: Left ventricular ejection fraction, by estimation, is 60 to 65%. The left ventricle has normal function. The left ventricle has no regional wall motion abnormalities. The left ventricular internal cavity size was normal in size. There is  mildly increased left ventricular hypertrophy. Left ventricular diastolic parameters are indeterminate. Right Ventricle: The right ventricular size is mildly enlarged. No increase in right ventricular wall thickness. Right ventricular systolic function is moderately reduced. There is  mildly elevated pulmonary artery systolic pressure. The tricuspid regurgitant velocity is 2.62 m/s, and with an assumed right atrial pressure of 8 mmHg, the estimated right ventricular systolic pressure is 48.2 mmHg. Left Atrium: Left atrial size was normal in size. Right Atrium: Right atrial size was mildly dilated. Pericardium: No pericardial effusion prominent epicardial adipose tissue. There is no evidence of pericardial effusion. Mitral Valve: The mitral valve is normal in structure and function. Normal mobility of the mitral valve leaflets. No evidence of mitral valve regurgitation. No evidence of mitral valve stenosis. Tricuspid Valve: The tricuspid valve is normal in structure. Tricuspid valve regurgitation is mild . No evidence of tricuspid stenosis. Aortic Valve: The aortic valve is normal in structure and function. Aortic valve regurgitation is not visualized. No aortic stenosis is present. Pulmonic Valve: The pulmonic valve was normal in structure. Pulmonic valve regurgitation is trivial. No evidence of pulmonic stenosis. Aorta: The aortic root is normal in size and structure and aortic dilatation noted. There is mild dilatation of the aortic root measuring 39 mm. Venous: The inferior vena cava is normal in size with greater than 50% respiratory variability, suggesting right atrial pressure of 3 mmHg. IAS/Shunts: No atrial level shunt detected by color flow Doppler.  LEFT VENTRICLE PLAX 2D LVIDd:         4.30 cm LVIDs:         2.70 cm LV PW:         1.10 cm LV IVS:        1.30 cm LVOT diam:     1.90 cm LV SV:         77.40 ml LV SV Index:  28.23 LVOT Area:     2.84 cm  RIGHT VENTRICLE RV S prime:     10.30 cm/s TAPSE (M-mode): 2.1 cm LEFT ATRIUM             Index       RIGHT ATRIUM           Index LA diam:        3.70 cm 1.89 cm/m  RA Area:     19.50 cm LA Vol (A2C):   34.3 ml 17.51 ml/m RA Volume:   51.90 ml  26.50 ml/m LA Vol (A4C):   17.0 ml 8.68 ml/m LA Biplane Vol: 25.8 ml 13.17 ml/m  AORTIC  VALVE LVOT Vmax:   139.00 cm/s LVOT Vmean:  99.400 cm/s LVOT VTI:    0.273 m  AORTA Ao Root diam: 3.60 cm Ao Asc diam:  3.90 cm TRICUSPID VALVE TR Peak grad:   27.5 mmHg TR Vmax:        262.00 cm/s  SHUNTS Systemic VTI:  0.27 m Systemic Diam: 1.90 cm Jenkins Rouge MD Electronically signed by Jenkins Rouge MD Signature Date/Time: 07/05/2019/12:15:47 PM    Final         Scheduled Meds: . aspirin EC  81 mg Oral Daily  . Chlorhexidine Gluconate Cloth  6 each Topical Daily  . enoxaparin (LOVENOX) injection  40 mg Subcutaneous Q24H  . influenza vaccine adjuvanted  0.5 mL Intramuscular Tomorrow-1000  . pantoprazole  20 mg Oral QODAY  . potassium chloride SA  20 mEq Oral Daily  . senna-docusate  1 tablet Oral Daily  . vitamin B-12  1,000 mcg Oral Daily   Continuous Infusions: . azithromycin 500 mg (07/06/19 2303)  . ceFEPime (MAXIPIME) IV 2 g (07/07/19 6063)    Assessment & Plan:   Acute hypoxic respiratory failure and sepsis syndrome: Present on admission.  Patient febrile, tachycardic with leukocytosis on presentation.-CT angiogram 07/05/2019-negative PE-bilateral lower lobe atelectasis versus infiltrate reported.  Been on IV antibiotics cefepime with Zithromax -MRSA PCR negative and therefore vancomycin discontinued.  Given report of vomiting when found on floor and bibasilar infiltrates, suspect aspiration event.  Patient had T-max of 102.70F on 2/13 and 100.4 on 2/14.  Will transition antibiotics to Unasyn and continue Zithromax.  Renew IV fluids.  Leukocytosis 13 K on presentation, peaked to 17 K and now downtrending to 11 K.  Blood cultures/urine cultures negative so far.  Acute encephalopathy/delirium:Patient found on floor, and CT C-spine and head negative admission.  Patient does have a history of NPH and is s/p VP shunt-not sure if patient had a mechanical fall and now confused due to head trauma/concussion.  Will obtain MRI head to rule out stroke or worsening hydrocephalus.  Will avoid  Ativan/any benzodiazepine for pre MRI sedation as it might make his delirium worse.  Cautious use of Haldol/Zyprexa given prolonged QTC on last EKG at 520 ms.  Repeat EKG today shows improvement to 440 ms.  Will utilize low-dose of IV Benadryl as needed.  Continue delirium precautions.  Given son's concerns with video monitoring, will request bedside sitter.  Resume melatonin/B12 supplements/aspirin that he takes at home.  IV hydration as patient not eating.  Speech therapy to follow and clear for oral feeding when appropriate  Acute kidney injury-Due to dehydration versus sepsis with admitting creatinine 1.51 Improved with IV hydration and after holding diuretics.  Renew IV fluids as patient unable to tolerate p.o.  Hypokalemia- On HCTZ and potassium 10 mEq at home.  Currently on  KCl 20 mEq.  Hypertension-HCTZ held on admission.  Currently SBP up at 140-170.  Will add low-dose Norvasc.  History of diabetes mellitus-not on any medications at home.  Check A1c.Glucose monitoring with sliding scale insulin as needed  ?  GERD: On PPI 20 mg at home.  Will resume.  Prolonged QTC: Previous EKG from December 2020 showed prolonged QT interval at 520 ms.  Repeat EKG today shows significant improvement with normal QT C at 440 ms.  Will add Seroquel at night to help with delirium.   Code Status: Full code DVT Prophylaxis:   SCD's Code Status: Full code Family / Patient Communication: Discussed with son at length at bedside. Disposition Plan: TBD     LOS: 2 days    Time spent: 40 minutes    Guilford Shi, MD [facility] Pager in [place]  If 7PM-7AM, please contact night-coverage [email or web address] Password [facility] 07/07/2019, 10:37 AM

## 2019-07-08 ENCOUNTER — Inpatient Hospital Stay (HOSPITAL_COMMUNITY): Payer: Medicare Other

## 2019-07-08 LAB — CBC
HCT: 42.4 % (ref 39.0–52.0)
Hemoglobin: 14.2 g/dL (ref 13.0–17.0)
MCH: 31.1 pg (ref 26.0–34.0)
MCHC: 33.5 g/dL (ref 30.0–36.0)
MCV: 93 fL (ref 80.0–100.0)
Platelets: 183 10*3/uL (ref 150–400)
RBC: 4.56 MIL/uL (ref 4.22–5.81)
RDW: 12.6 % (ref 11.5–15.5)
WBC: 7.2 10*3/uL (ref 4.0–10.5)
nRBC: 0 % (ref 0.0–0.2)

## 2019-07-08 LAB — BASIC METABOLIC PANEL
Anion gap: 11 (ref 5–15)
BUN: 22 mg/dL (ref 8–23)
CO2: 28 mmol/L (ref 22–32)
Calcium: 8.8 mg/dL — ABNORMAL LOW (ref 8.9–10.3)
Chloride: 103 mmol/L (ref 98–111)
Creatinine, Ser: 1.17 mg/dL (ref 0.61–1.24)
GFR calc Af Amer: >60 mL/min (ref >60)
GFR calc non Af Amer: 59 mL/min — ABNORMAL LOW (ref >60)
Glucose, Bld: 123 mg/dL — ABNORMAL HIGH (ref 70–99)
Potassium: 3.2 mmol/L — ABNORMAL LOW (ref 3.5–5.1)
Sodium: 142 mmol/L (ref 135–145)

## 2019-07-08 LAB — GLUCOSE, CAPILLARY: Glucose-Capillary: 107 mg/dL — ABNORMAL HIGH (ref 70–99)

## 2019-07-08 LAB — MAGNESIUM: Magnesium: 1.9 mg/dL (ref 1.7–2.4)

## 2019-07-08 MED ORDER — POTASSIUM CHLORIDE 10 MEQ/100ML IV SOLN
10.0000 meq | INTRAVENOUS | Status: AC
  Administered 2019-07-08 (×2): 10 meq via INTRAVENOUS
  Filled 2019-07-08: qty 100

## 2019-07-08 MED ORDER — MELATONIN 3 MG PO TABS
9.0000 mg | ORAL_TABLET | Freq: Every evening | ORAL | Status: DC | PRN
  Filled 2019-07-08: qty 3

## 2019-07-08 MED ORDER — ADULT MULTIVITAMIN W/MINERALS CH
1.0000 | ORAL_TABLET | Freq: Every day | ORAL | Status: DC
  Administered 2019-07-08 – 2019-07-15 (×8): 1 via ORAL
  Filled 2019-07-08 (×9): qty 1

## 2019-07-08 NOTE — Procedures (Signed)
Patient Name: Jeremiah [name]  MRN: [id number]  Epilepsy Attending: Lora Havens  Referring Physician/Provider: Dr. Guilford Shi Date: 07/08/2019 Duration: 25.43 minutes  Patient history: 79 year old male with history of NPH status post shunting presented with acute respiratory failure, found to be septic.  Patient continues to be altered.  Therefore EEG to evaluate for seizures.  Level of alertness: Awake/lethargic  AEDs during EEG study: None  Technical aspects: This EEG study was done with scalp electrodes positioned according to the 10-20 International system of electrode placement. Electrical activity was acquired at a sampling rate of 500Hz  and reviewed with a high frequency filter of 70Hz  and a low frequency filter of 1Hz . EEG data were recorded continuously and digitally stored.   Description: No clear posterior dominant was seen.  EEG showed continuous generalized 3 to 6 Hz theta and delta slowing.  Hyperventilation photic summation were not performed.  Abnormality -Continuous slow, generalized  IMPRESSION: This study is suggestive of moderate diffuse encephalopathy, nonspecific etiology. No seizures or epileptiform discharges were seen throughout the recording.  [name]

## 2019-07-08 NOTE — Progress Notes (Addendum)
Pt progressive monitor alerted abnormal rhythm. On entering patients room, pt was super diaphoretic to the point that he soaked his gown and covers. Pt was not arousable. Pt was not responding to voice or me tapping him calling his name. A blood sugar was taken, and it was 102. Vs: were BP: 171/133:, HR:144, RR: 31, 02: 87%, Temp: 97.8. Pt began foaming at the mouth. A rapid response was called and 02 was bumped up. We continued to try to communicate with patient, and eventually patient opened eyes and became arousal. Dr. Baltazar Najjar was notified, and a chest x-ray was ordered. Will continue to monitor pt.

## 2019-07-08 NOTE — Progress Notes (Addendum)
PROGRESS NOTE    Jeremiah [name]  HFW:[id number]  DOB: [birth year]  PCP: [name], MD Admit date:07/04/2019  79 y.o. male,  medical history including but not limited to hypertension, Dm2, prostate cancer s/p XRT, OSA on CPAP, NPH s/p VP shunt, presenting with altered mental status associated with fever, tachycardia, hypoxia and tachypnea after being found on the floor.According to son, patient at baseline is quite "sharp".  He does have history of NPH but not very ataxic usually.  Apparently patient's daughter and wife went out and when returned home, they found patient on the floor not knowing how he got there.  He appeared to have vomited.  He was apparently doing well and at baseline before they went out.  No report of family noting fever or chills or diarrhea or cough preceding the episode.  Patient has video monitor in the room but son concerned that he is not able to be redirected.  ED course: Labs with AKI,creatinine 1.51 and hypokalemia .CT C-spine and head negative admission. Chest x-ray noted acute infiltrate.CT angiogram 07/05/2019-negative PE-bilateral lower lobe atelectasis versus infiltrate.  Hospital course: Patient admitted to [facility] for acute hypoxic respiratory failure and sepsis due to presumed pneumonia with IV antibiotics cefepime with Zithromax -MRSA PCR negative and therefore vancomycin discontinued.  Hospital course complicated by ongoing delirium as well as episode of hypoxia with O2 sats in the 70s and unresponsiveness on the night of 2/15 when RRT was summoned.  Subjective: Patient much more awake, alert and communicative today.  Was able to recognize son at bedside and tell me his date of birth, home address etc.  He does not recollect events leading to his fall at home.  He denies any pain or discomfort currently.  Blood pressure improved.  Son at bedside and happy with progress today. Noted episode of unresponsiveness with hypoxia last night.  RRT was summoned as patient  noted to be diaphoretic, foaming at his mouth-briefly placed on NRB mask and then repositioned-was quickly able to taper O2 down to 4 L returning sats to 100%.  The description of the episode raises suspicion for another aspiration event.  Objective: Vitals:   07/08/19 0025 07/08/19 0738 07/08/19 0749 07/08/19 1212  BP: 115/60  116/64 130/89  Pulse: 65     Resp: 14 18    Temp: 97.8 F (36.6 C)  98.8 F (37.1 C)   TempSrc: Rectal  Axillary   SpO2: 95% 97%    Weight:      Height:        Intake/Output Summary (Last 24 hours) at 07/08/2019 1414 Last data filed at 07/08/2019 1053 Gross per 24 hour  Intake 1417.51 ml  Output 950 ml  Net 467.51 ml   Filed Weights   07/05/19 0019 07/05/19 1209  Weight: 80 kg 80 kg    Physical Examination:  General exam: Appears more awake, alert today-denies any pain, no acute distress Respiratory system: Clear to auscultation. Respiratory effort normal. Cardiovascular system: S1 & S2 heard, RRR. No JVD, murmurs.No pedal edema. Gastrointestinal system: Abdomen is nondistended, soft and nontender. Normal BS. Central nervous system: Awake alert oriented x3 this afternoon.  Moving all extremities equally.  Speech appears mumbled but much clearer than yesterday. Extremities: No contractures, edema or joint deformities.  Skin: No rashes, lesions or ulcers Psychiatry: Judgement and insight appear normal. Mood & affect appropriate.   Data Reviewed: I have personally reviewed following labs and imaging studies  CBC: Recent Labs  Lab 07/04/19 2332 07/05/19 0700  07/06/19 0248 07/07/19 0255 07/08/19 0914  WBC 13.0* 17.0* 11.6* 11.6* 7.2  NEUTROABS 12.1*  --   --   --   --   HGB 16.5 14.6 13.0 14.6 14.2  HCT 51.1 43.8 40.4 44.5 42.4  MCV 96.6 94.2 95.7 94.3 93.0  PLT 175 156 155 159 761   Basic Metabolic Panel: Recent Labs  Lab 07/04/19 2332 07/05/19 0700 07/06/19 0248 07/07/19 0255 07/08/19 0914  NA 143 141 142 142 142  K 2.9* 3.4* 3.1*  3.8 3.2*  CL 103 102 102 105 103  CO2 25 25 27 28 28   GLUCOSE 192* 210* 129* 111* 123*  BUN 17 16 16 17 22   CREATININE 1.51* 1.41* 1.23 1.14 1.17  CALCIUM 9.0 8.5* 8.5* 8.9 8.8*   GFR: Estimated Creatinine Clearance: 51.2 mL/min (by C-G formula based on SCr of 1.17 mg/dL). Liver Function Tests: Recent Labs  Lab 07/04/19 2332 07/05/19 0700 07/06/19 0248 07/07/19 0255  AST 21 25 57* 63*  ALT 11 11 12 15   ALKPHOS 53 40 36* 41  BILITOT 1.2 0.7 1.4* 1.7*  PROT 6.8 5.6* 5.4* 6.2*  ALBUMIN 3.8 3.0* 2.9* 3.0*   No results for input(s): LIPASE, AMYLASE in the last 168 hours. Recent Labs  Lab 07/05/19 0700  AMMONIA 26   Coagulation Profile: Recent Labs  Lab 07/04/19 2332  INR 1.0   Cardiac Enzymes: Recent Labs  Lab 07/04/19 2332  CKTOTAL 44*   BNP (last 3 results) No results for input(s): PROBNP in the last 8760 hours. HbA1C: Recent Labs    07/07/19 1947  HGBA1C 6.0*   CBG: Recent Labs  Lab 07/07/19 2216  GLUCAP 102*   Lipid Profile: No results for input(s): CHOL, HDL, LDLCALC, TRIG, CHOLHDL, LDLDIRECT in the last 72 hours. Thyroid Function Tests: No results for input(s): TSH, T4TOTAL, FREET4, T3FREE, THYROIDAB in the last 72 hours. Anemia Panel: No results for input(s): VITAMINB12, FOLATE, FERRITIN, TIBC, IRON, RETICCTPCT in the last 72 hours. Sepsis Labs: Recent Labs  Lab 07/04/19 2332 07/05/19 0215 07/05/19 0700  LATICACIDVEN 2.6* 4.8* 3.7*    Recent Results (from the past 240 hour(s))  Culture, blood (Routine x 2)     Status: None (Preliminary result)   Collection Time: 07/04/19 11:34 PM   Specimen: BLOOD  Result Value Ref Range Status   Specimen Description BLOOD LEFT ANTECUBITAL  Final   Special Requests   Final    BOTTLES DRAWN AEROBIC AND ANAEROBIC Blood Culture results may not be optimal due to an inadequate volume of blood received in culture bottles   Culture   Final    NO GROWTH 3 DAYS Performed at [facility] [address] [place], [place] [address]    Report Status PENDING  Incomplete  Culture, blood (Routine x 2)     Status: None (Preliminary result)   Collection Time: 07/04/19 11:34 PM   Specimen: BLOOD  Result Value Ref Range Status   Specimen Description BLOOD RIGHT ANTECUBITAL  Final   Special Requests   Final    BOTTLES DRAWN AEROBIC AND ANAEROBIC Blood Culture results may not be optimal due to an excessive volume of blood received in culture bottles   Culture   Final    NO GROWTH 3 DAYS Performed at [facility], [place] [address], [place], [place] [address]    Report Status PENDING  Incomplete  Urine culture     Status: None   Collection Time: 07/04/19 11:44 PM  Specimen: Urine, Catheterized  Result Value Ref Range Status   Specimen Description URINE, CATHETERIZED  Final   Special Requests NONE  Final   Culture   Final    NO GROWTH Performed at [facility], [address] [place], [place] [address]    Report Status 07/06/2019 FINAL  Final  Respiratory Panel by RT PCR (Flu A&B, Covid) - Nasopharyngeal Swab     Status: None   Collection Time: 07/05/19 12:36 AM   Specimen: Nasopharyngeal Swab  Result Value Ref Range Status   SARS Coronavirus 2 by RT PCR NEGATIVE NEGATIVE Final    Comment: (NOTE) SARS-CoV-2 target nucleic acids are NOT DETECTED. The SARS-CoV-2 RNA is generally detectable in upper respiratoy specimens during the acute phase of infection. The lowest concentration of SARS-CoV-2 viral copies this assay can detect is 131 copies/mL. A negative result does not preclude SARS-Cov-2 infection and should not be used as the sole basis for treatment or other patient management decisions. A negative result may occur with  improper specimen collection/handling, submission of specimen other than nasopharyngeal swab, presence of viral mutation(s) within the areas targeted by this assay, and inadequate number of viral copies (<131 copies/mL). A negative result must be combined  with clinical observations, patient history, and epidemiological information. The expected result is Negative. Fact Sheet for Patients:  PinkCheek.be Fact Sheet for Healthcare Providers:  GravelBags.it This test is not yet ap proved or cleared by the [place] FDA and  has been authorized for detection and/or diagnosis of SARS-CoV-2 by FDA under an Emergency Use Authorization (EUA). This EUA will remain  in effect (meaning this test can be used) for the duration of the COVID-19 declaration under Section 564(b)(1) of the Act, 21 U.S.C. section 360bbb-3(b)(1), unless the authorization is terminated or revoked sooner.    Influenza A by PCR NEGATIVE NEGATIVE Final   Influenza B by PCR NEGATIVE NEGATIVE Final    Comment: (NOTE) The Xpert Xpress SARS-CoV-2/FLU/RSV assay is intended as an aid in  the diagnosis of influenza from Nasopharyngeal swab specimens and  should not be used as a sole basis for treatment. Nasal washings and  aspirates are unacceptable for Xpert Xpress SARS-CoV-2/FLU/RSV  testing. Fact Sheet for Patients: PinkCheek.be Fact Sheet for Healthcare Providers: GravelBags.it This test is not yet approved or cleared by the [place] FDA and  has been authorized for detection and/or diagnosis of SARS-CoV-2 by  FDA under an Emergency Use Authorization (EUA). This EUA will remain  in effect (meaning this test can be used) for the duration of the  Covid-19 declaration under Section 564(b)(1) of the Act, 21  U.S.C. section 360bbb-3(b)(1), unless the authorization is  terminated or revoked. Performed at [facility], [place] [address], [place], [place] [address]   MRSA PCR Screening     Status: None   Collection Time: 07/05/19  6:11 AM   Specimen: Nasal Mucosa; Nasopharyngeal  Result Value Ref Range Status   MRSA by PCR NEGATIVE NEGATIVE Final     Comment:        The GeneXpert MRSA Assay (FDA approved for NASAL specimens only), is one component of a comprehensive MRSA colonization surveillance program. It is not intended to diagnose MRSA infection nor to guide or monitor treatment for MRSA infections. Performed at [facility], [place] [address], [place], [place] [address]       Radiology Studies: DG Chest Port 1 View  Result Date: 07/07/2019 CLINICAL DATA:  Diabetes, hypertension, prostate  cancer, altered level of consciousness, fever EXAM: PORTABLE CHEST 1 VIEW COMPARISON:  07/05/2019, 07/04/2019 FINDINGS: Single frontal view of the chest demonstrates a stable cardiac silhouette. Ventriculostomy catheter overlying right chest unchanged. Chronic elevation of the right hemidiaphragm with right basilar atelectasis unchanged. There is increasing consolidation at the left lung base. Small left effusion not excluded. No pneumothorax. IMPRESSION: 1. Progressive left basilar consolidation with likely small left pleural effusion. This could be related to atelectasis given previous CT findings. 2. Chronic elevation right hemidiaphragm with compressive atelectasis right base. Electronically Signed   By: Randa Ngo M.D.   On: 07/07/2019 23:18   EEG adult  Result Date: 07/08/2019 Lora Havens, MD     07/08/2019 10:42 AM Patient Name: Jeremiah [name] MRN: [id number] Epilepsy Attending: Lora Havens Referring Physician/Provider: Dr. Guilford Shi Date: 07/08/2019 Duration: 25.43 minutes Patient history: 79 year old male with history of NPH status post shunting presented with acute respiratory failure, found to be septic.  Patient continues to be altered.  Therefore EEG to evaluate for seizures. Level of alertness: Awake/lethargic AEDs during EEG study: None Technical aspects: This EEG study was done with scalp electrodes positioned according to the 10-20 International system of electrode placement. Electrical activity was acquired at  a sampling rate of 500Hz  and reviewed with a high frequency filter of 70Hz  and a low frequency filter of 1Hz . EEG data were recorded continuously and digitally stored. Description: No clear posterior dominant was seen.  EEG showed continuous generalized 3 to 6 Hz theta and delta slowing.  Hyperventilation photic summation were not performed. Abnormality -Continuous slow, generalized IMPRESSION: This study is suggestive of moderate diffuse encephalopathy, nonspecific etiology. No seizures or epileptiform discharges were seen throughout the recording. Priyanka Barbra Sarks        Scheduled Meds:  amLODipine  2.5 mg Oral Daily   aspirin EC  81 mg Oral Daily   Chlorhexidine Gluconate Cloth  6 each Topical Daily   enoxaparin (LOVENOX) injection  40 mg Subcutaneous Q24H   feeding supplement (ENSURE ENLIVE)  237 mL Oral BID BM   feeding supplement (PRO-STAT SUGAR FREE 64)  30 mL Oral Daily   influenza vaccine adjuvanted  0.5 mL Intramuscular Tomorrow-1000   multivitamin with minerals  1 tablet Oral Daily   pantoprazole  20 mg Oral QODAY   potassium chloride SA  20 mEq Oral Daily   QUEtiapine  25 mg Oral QHS   senna-docusate  1 tablet Oral Daily   vitamin B-12  1,000 mcg Oral Daily   Continuous Infusions:  ampicillin-sulbactam (UNASYN) IV 1.5 g (07/08/19 1047)   dextrose 5% lactated ringers 75 mL/hr at 07/08/19 1137    Assessment & Plan:   Acute hypoxic respiratory failure and sepsis syndrome: Present on admission due to presumed aspiration pneumonia.  Patient febrile, tachycardic with leukocytosis on presentation.-CT angiogram 07/05/2019-negative PE-bilateral lower lobe atelectasis versus infiltrate reported.  Been on IV antibiotics cefepime with Zithromax -MRSA PCR negative and therefore vancomycin discontinued.  Given report of vomiting when found on floor and bibasilar infiltrates, suspect aspiration event.  Patient had T-max of 102.34F on 2/13 and 100.4 on 2/14.  Transitioned antibiotic regimen to  Unasyn on 2/15.  Renewed IV fluids.  Leukocytosis 13 K on presentation, peaked to 17 K and now normalized.  Last night's episode suspicion for aspiration as well as he improved upon repositioning.  Blood cultures/urine cultures negative so far.  Currently saturating well on 3 L O2, taper as tolerated to keep sats greater than  90%.  Since patient more awake, will obtain swallow evaluation.  Aspiration precautions.   Acute metabolic encephalopathy/delirium:in the setting of sepsis/hypoxia and fall. Patient found on floor, and CT C-spine and head negative admission.  Patient does have a history of NPH and is s/p VP shunt-not sure if patient had a mechanical fall and now confused due to head trauma/concussion.  Will obtain MRI head to rule out stroke or worsening hydrocephalus.  Will avoid Ativan/any benzodiazepine for pre MRI sedation as it might make his delirium worse.  Had ordered low-dose of IV Benadryl to be given before MRI but since patient much more awake, alert and agreeable to MRI, will DC IV Benadryl as discussed with neurology.  He did undergo EEG which was suggestive of generalized encephalopathy with no focal seizures.  Appreciate neurology evaluation, will follow up MRI results.  Continue delirium/aspiration precautions.  Continue low-dose Seroquel which was started yesterday as it does seem to be helping him (QTC 440 ms on EKG this admission).  Resume melatonin/B12 supplements/aspirin that he takes at home.  Continue IV hydration for another day until oral intake improves.    Acute kidney injury-Due to dehydration versus sepsis with admitting creatinine 1.51 Improved with IV hydration and after holding diuretics.  Continue IVF for another day   Hypokalemia- On HCTZ and potassium 10 mEq at home.  Currently on KCl 20 mEq.   Hypertension-HCTZ held on admission. SBP up at 140-170 yesterday and normalized with addition of low-dose Norvasc--watch for hypotension/orthostasis.  Prolonged QTC:  Previous EKG from December 2020 showed prolonged QT interval at 520 ms.  Repeat EKG today shows significant improvement with normal QT C at 440 ms.  Monitor periodically while utilizing psych meds/Seroquel.  Replace electrolytes to keep potassium close to 4 and magnesium to 2.   History of diabetes mellitus-not on any medications at home.  Hemoglobin A1c at 6.0.  Likely prediabetic.  Glucose monitoring with sliding scale insulin as needed   ?  GERD: On PPI 20 mg at home.    Code Status: Full code DVT Prophylaxis:   Lovenox Code Status: Full code Family / Patient Communication: Discussed with patient and son.  Discussed with neurologist Disposition Plan: PT/OT evaluation, likely home with home health in next 48-72 hours if mental status, oral intake continues to improve, fever resolves and neuro work up complete/cleared by neurology    LOS: 3 days    Time spent: 35 minutes    Guilford Shi, MD [facility] Pager in [place]  If 7PM-7AM, please contact night-coverage [email or web address] Password [facility] 07/08/2019, 2:14 PM

## 2019-07-08 NOTE — Significant Event (Signed)
Rapid Response Event Note  Overview: AMS with desaturation  Initial Focused Assessment: Called to the room due to unresponsive patient with sats in the 70s. Upon arrival, Jeremiah [name] was alert, disoriented sitting upright in bed. He was in no visible distress. He was very diaphoretic (received tylenol). He was briefly placed on a NRB mask as we repositioned his malpositioned pulse oximetry probe. We quickly weaned him back to [facility] and his sats were 100 %. BBS Coarse and diminished in the bases. No accessory muscle use.   Interventions: -No RRRN interventions  Plan of Care (if not transferred): -Notify primary service of event and further orders  Event Summary: Call received 2219 Arrived 2222 Call ended 2235  Madelynn Done

## 2019-07-08 NOTE — Progress Notes (Signed)
EEG complete - results pending 

## 2019-07-08 NOTE — Consult Note (Signed)
Neurology Consultation Reason for Consult: Altered mental status Referring Physician: Dr. Guilford Shi  CC: Altered mental status  History is obtained from: Son at bedside, chart review  HPI: Jeremiah [name] is a 79 y.o. male with past medical history of hypertension, hyperlipidemia, NPH status post VP shunt placement, obstructive sleep apnea on CPAP who presented on 07/04/2019 after being found down on floor.  Patient is unable to provide any history.  Son who is at bedside was not present at the time of the incidence.  He states his mother/patient's wife was at home but has dementia and is not the best historian.  However as far as he knows patient was doing well until prior to admission and on the day of admission patient's wife found him on the floor, confused but still able to communicate.  She called EMS and by the time EMS arrived patient was confused and not communicating.  He was [facility] to [facility], ED. on arrival vital signs were temperature 101.9 F, blood pressure 154/82, heart rate 111, respiratory rate 25, SPO2 96%. CT head was performed which did not show any acute abnormality.  Chest x-ray was performed which was concerning for pneumonia in setting of fever.  Patient was admitted on the internal medicine service and started on vancomycin, cefepime and azithromycin IV.  Patient was confused and received Seroquel yesterday evening around 2042 per MAR.  Around 2215, RN was alerted due to abnormal rhythm.  She went to examine the patient and per her documentation patient was diaphoretic, not responding to voice or tactile stimuli.  Blood glucose was 102, blood pressure 171/133, heart rate 144, respiratory rate 31, O2 sats 87%, temperature 97.8.  Patient also appeared to be foaming at mouth.  Rapid response was called and patient was placed on nonrebreather mask after which oxygen saturation improved to 100%.  Patient was weaned back to 4 L nasal cannula.  Chest x-ray was performed which  showed progressive left basilar consolidation with likely small left pleural effusion as well as elevation of right hemidiaphragm with compressive atelectasis at the right base.  Neurology was consulted today due to continued altered mental status and concern for seizure as patient was found down on arrival and had an episode yesterday evening where he was altered, foaming at mouth.   ROS: A 14 point ROS unable to obtain due to altered mental status.   Past Medical History:  Diagnosis Date  . BPH (benign prostatic hyperplasia)   . Central retinal artery occlusion   . Diabetes mellitus without complication (HCC)    diet controlled  . Diverticulosis   . History of kidney stones   . Hyperlipidemia   . Hypertension   . OSA on CPAP    wears cpap  . Osteoarthritis   . 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 S/P radiation therapy 04/23/13 - 05/29/14   Prostate/seminal vesicles, external beam 4500 cGy in 25 sessions  . Seasonal allergies   . Skin cancer    scalp  . Wears glasses     Family History  Problem Relation Age of Onset  . Heart attack Father 11  . Cancer Father        prostate  . Diabetes Father   . Cancer Paternal Uncle        prostate  . Dementia Mother 73  . Lung cancer Brother    Social History: Per chart review reports that he quit smoking about 58 years ago.  His smoking use included cigarettes. He has never used smokeless tobacco. He reports current alcohol use of about 1.0 standard drinks of alcohol per week. He reports that he does not use drugs.  Exam: Current vital signs: BP 130/89 (BP Location: Right Arm)   Pulse 65   Temp 98.8 F (37.1 C) (Axillary)   Resp 18   Ht 5\' 9"  (1.753 m)   Wt 80 kg   SpO2 97%   BMI 26.05 kg/m  Vital signs in last 24 hours: Temp:  [97.8 F (36.6 C)-99.4 F (37.4 C)] 98.8 F (37.1 C) (02/16 0749) Pulse Rate:  [65-94] 65 (02/16 0025) Resp:  [14-25] 18 (02/16 0738) BP: (115-160)/(60-126) 130/89 (02/16  1212) SpO2:  [95 %-100 %] 97 % (02/16 0738)   Physical Exam  Constitutional: Appears well-developed and well-nourished.  Psych: Pleasant, confused Eyes: No scleral injection HENT: No OP obstrucion Head: Normocephalic.  Cardiovascular: Normal rate and regular rhythm.  Respiratory: Effort normal, non-labored breathing GI: Soft.  No distension. There is no tenderness.  Skin: WDI  Neuro: Awake, alert, follows simple one-step commands, able to name objects, knows his name, knew his son, was able to see hospital when given 3 options about location, did not know time.  Cranial nerves II to XII grossly intact, 4/5 strength in all 4 extremities, 2+ reflexes in bilateral biceps and knee jerk, downgoing plantars bilaterally.  I have reviewed labs in epic and the results pertinent to this consultation are: Potassium 3.2 Total bilirubin 1.4-->1.7 AST 57-->57  I have reviewed the images obtained:     ASSESSMENT/PLAN: 79 year old male with history of NPH status post VP shunt placement who presented with altered mental status and was found to have pneumonia.  Neurology was consulted due to concern for seizures.   Transient alteration of awareness  Acute encephalopathy, likely infectious Acute respiratory failure Suspected pneumonia Acute kidney injury (resolved) Hypoalbuminemia  Transaminitis Hyperbilirubinemia -Encephalopathy in setting of fall, suspected pneumonia in an elderly male with NPH  Recommendations -Routine EEG consistent with encephalopathy -CT head did not show any evidence of increased ICP -TSH, ammonia, B12 within normal limits -Semiology of the episode that patient had yesterday along with EEG findings is very less likely to be a seizure.  Therefore will not start any antiepileptic drugs at this point -MRI brain without contrast has been ordered by primary team.  Patient did not have any focal neurological deficits on exam, therefore low suspicion for a large stroke.   Weighing the benefits and the risks of sedating the patient for MRI versus waiting, I would recommend proceeding with MRI brain if patient can tolerate it without sedation.  However patient require sedation, I would recommend waiting till patient's mental status improves -Continue delirium precautions -Minimize sedating meds.  However okay to continue low-dose Seroquel if needed while inpatient -Rest of the management per primary team   Thank you for allowing [place] to participate in the care of this patient.  Neurology will follow.  Please page neuro hospitalist for any further questions after 5 PM.  I have spent a total of   75 minutes with the patient reviewing hospital notes,  test results, labs and examining the patient as well as establishing an assessment and plan that was discussed personally with the patient's son at bedside and Dr Earnest Conroy.  > 50% of time was spent in direct patient care.   Mabeline Varas Barbra Sarks

## 2019-07-08 NOTE — Plan of Care (Signed)
  Problem: Skin Integrity: Goal: Risk for impaired skin integrity will decrease Outcome: Progressing Monitor for incontinence. Primo intact and changed as appropriate. Encourage good oral intake.    Problem: Respiratory: Goal: Ability to maintain adequate ventilation will improve Outcome: Progressing Goal: Ability to maintain a clear airway will improve Outcome: Progressing  Assess Q4hrs as well as vitals. Monitor for signs of aspiration. Speech consult.

## 2019-07-08 NOTE — Evaluation (Signed)
Clinical/Bedside Swallow Evaluation Patient Details  Name: Jeremiah [name] MRN: [id number] Date of Birth: 02-29-1941  Today's Date: 07/08/2019 Time: SLP Start Time (ACUTE ONLY): 9833 SLP Stop Time (ACUTE ONLY): 1551 SLP Time Calculation (min) (ACUTE ONLY): 13 min  Past Medical History:  Past Medical History:  Diagnosis Date  . BPH (benign prostatic hyperplasia)   . Central retinal artery occlusion   . Diabetes mellitus without complication (HCC)    diet controlled  . Diverticulosis   . History of kidney stones   . Hyperlipidemia   . Hypertension   . OSA on CPAP    wears cpap  . Osteoarthritis   . 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 S/P radiation therapy 04/23/13 - 05/29/14   Prostate/seminal vesicles, external beam 4500 cGy in 25 sessions  . Seasonal allergies   . Skin cancer    scalp  . Wears glasses    Past Surgical History:  Past Surgical History:  Procedure Laterality Date  . BACK SURGERY  2009  . COLONOSCOPY W/ BIOPSIES AND POLYPECTOMY    . [place]  . LEFT HEART CATH AND CORONARY ANGIOGRAPHY N/A 03/14/2019   Procedure: LEFT HEART CATH AND CORONARY ANGIOGRAPHY;  Surgeon: [name], MD;  Location: [facility];  Service: Cardiovascular;  Laterality: N/A;  . PROSTATE BIOPSY  2013, 2014, 01/2014   Gleason 7  . RADIOACTIVE SEED IMPLANT N/A 07/01/2014   Procedure: RADIOACTIVE SEED IMPLANT;  Surgeon: [name], MD;  Location: [facility];  Service: Urology;  Laterality: N/A;  DR PORTABLE  . TONSILLECTOMY    . 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   Procedure: SHUNT INSERTION VENTRICULAR-PERITONEAL;  Surgeon: [name], MD;  Location: [facility];  Service: Neurosurgery;  Laterality: N/A;  SHUNT INSERTION VENTRICULAR-PERITONEAL   HPI:  79 y.o. male, medical history including but not limited to hypertension, Dm2, prostate cancer s/p XRT, OSA on CPAP, NPH s/p VP shunt, presenting with altered mental  status associated with fever, tachycardia, hypoxia and tachypnea after being found on the floor. Per chart son reports patient at baseline is quite "sharp". Per MD note suspicous pt may "have had another aspiration event."  had episode last night of unresponsiveness with hypoxia. Noted to be diaphoretic, foaming at his mouth-briefly placed on NRB mask and then repositioned-was quickly able to taper O2 down to 4 L returning sats to 100%.  CXR Progressive left basilar consolidation with likely small left pleural effusion. This could be related to atelectasis given previous CT findings. 2. Chronic elevation right hemidiaphragm with compressive atelectasis right base.   Assessment / Plan / Recommendation Clinical Impression  Pt was awake with delayed responses and tended to stare at the wall/tv but could focus his attention to therapist. No oral-motor impairments. Multiple straw and cups sips water consumed without outward indications of compromised airway. Effective mastication and transit with solid followed with sips that were coordinated. Recommend pt continue regular/thin, pills with water. Will check on him once more while in acute care.   SLP Visit Diagnosis: Dysphagia, unspecified (R13.10)    Aspiration Risk  Mild aspiration risk    Diet Recommendation Regular;Thin liquid   Liquid Administration via: Cup;Straw Medication Administration: Whole meds with liquid Supervision: Patient able to self feed;Full supervision/cueing for compensatory strategies Compensations: Minimize environmental distractions Postural Changes: Seated upright at 90 degrees    Other  Recommendations Oral Care Recommendations: Oral care BID   Follow up Recommendations  Other (comment)(TBD)      Frequency and Duration min 1 x/week  2 weeks       Prognosis Prognosis for Safe Diet Advancement: Good Barriers to Reach Goals: Cognitive deficits      Swallow Study   General HPI: 79 y.o. male, medical history  including but not limited to hypertension, Dm2, prostate cancer s/p XRT, OSA on CPAP, NPH s/p VP shunt, presenting with altered mental status associated with fever, tachycardia, hypoxia and tachypnea after being found on the floor. Per chart son reports patient at baseline is quite "sharp". Per MD note suspicous pt may "have had another aspiration event."  had episode last night of unresponsiveness with hypoxia. Noted to be diaphoretic, foaming at his mouth-briefly placed on NRB mask and then repositioned-was quickly able to taper O2 down to 4 L returning sats to 100%.  CXR Progressive left basilar consolidation with likely small left pleural effusion. This could be related to atelectasis given previous CT findings. 2. Chronic elevation right hemidiaphragm with compressive atelectasis right base. Type of Study: Bedside Swallow Evaluation Previous Swallow Assessment: (none) Diet Prior to this Study: Regular;Thin liquids Temperature Spikes Noted: No Respiratory Status: Nasal cannula History of Recent Intubation: No Behavior/Cognition: Cooperative;Pleasant mood;Other (Comment)(tends to stare) Oral Cavity Assessment: Within Functional Limits Oral Care Completed by SLP: Other (Comment)(cleaned lips) Oral Cavity - Dentition: Adequate natural dentition Vision: Functional for self-feeding Self-Feeding Abilities: Needs assist Patient Positioning: Upright in bed Baseline Vocal Quality: Normal Volitional Cough: Weak    Oral/Motor/Sensory Function Overall Oral Motor/Sensory Function: (no focal weakness)   Ice Chips Ice chips: Within functional limits   Thin Liquid Thin Liquid: Within functional limits Presentation: Cup;Straw    Nectar Thick Nectar Thick Liquid: Not tested   Honey Thick Honey Thick Liquid: Not tested   Puree Puree: Within functional limits   Solid     Solid: Within functional limits      Houston Siren 07/08/2019,4:38 PM  Orbie Pyo Stoy.Ed Chief Technology Officer [phone] Office [phone]

## 2019-07-09 LAB — CBC
HCT: 44.7 % (ref 39.0–52.0)
Hemoglobin: 14.8 g/dL (ref 13.0–17.0)
MCH: 30.9 pg (ref 26.0–34.0)
MCHC: 33.1 g/dL (ref 30.0–36.0)
MCV: 93.3 fL (ref 80.0–100.0)
Platelets: 184 10*3/uL (ref 150–400)
RBC: 4.79 MIL/uL (ref 4.22–5.81)
RDW: 12.4 % (ref 11.5–15.5)
WBC: 7 10*3/uL (ref 4.0–10.5)
nRBC: 0 % (ref 0.0–0.2)

## 2019-07-09 LAB — BASIC METABOLIC PANEL
Anion gap: 13 (ref 5–15)
BUN: 20 mg/dL (ref 8–23)
CO2: 28 mmol/L (ref 22–32)
Calcium: 8.7 mg/dL — ABNORMAL LOW (ref 8.9–10.3)
Chloride: 98 mmol/L (ref 98–111)
Creatinine, Ser: 1.03 mg/dL (ref 0.61–1.24)
GFR calc Af Amer: >60 mL/min (ref >60)
GFR calc non Af Amer: >60 mL/min (ref >60)
Glucose, Bld: 119 mg/dL — ABNORMAL HIGH (ref 70–99)
Potassium: 3.2 mmol/L — ABNORMAL LOW (ref 3.5–5.1)
Sodium: 139 mmol/L (ref 135–145)

## 2019-07-09 LAB — MAGNESIUM: Magnesium: 1.8 mg/dL (ref 1.7–2.4)

## 2019-07-09 MED ORDER — POTASSIUM CHLORIDE 10 MEQ/100ML IV SOLN
10.0000 meq | INTRAVENOUS | Status: AC
  Administered 2019-07-09 (×4): 10 meq via INTRAVENOUS
  Filled 2019-07-09 (×4): qty 100

## 2019-07-09 MED ORDER — POTASSIUM CHLORIDE CRYS ER 20 MEQ PO TBCR
40.0000 meq | EXTENDED_RELEASE_TABLET | Freq: Every day | ORAL | Status: DC
  Administered 2019-07-10 – 2019-07-11 (×2): 40 meq via ORAL
  Filled 2019-07-09 (×2): qty 2

## 2019-07-09 NOTE — Progress Notes (Signed)
RN called to check status of MRI ordered. Per MRI, awaiting to hear back from Dr. Ronnald Ramp' office about shunt prior to being able to complete MRI. Shunt will need to be reprogrammed/checked after MRI.

## 2019-07-09 NOTE — Plan of Care (Signed)
  Problem: Nutrition: Goal: Adequate nutrition will be maintained Outcome: Progressing  Pt more alert and able to eat some meals. Breakfast 50% eaten this morning. Continues to drink ensures.

## 2019-07-09 NOTE — Progress Notes (Signed)
[name]  PROGRESS NOTE    Jeremiah [name]  MOQ:[id number] DOB: 1940/08/15 DOA: 07/04/2019 PCP: [name], MD   Brief Narrative:   Admit date:07/04/2019  78 y.o.male,medical history including but not limited to hypertension, Dm2, prostate cancer s/p XRT, OSA on CPAP, NPH s/p VP shunt, presenting with altered mental status associated with fever, tachycardia, hypoxia and tachypnea after being found on the floor.According to son, patient at baseline is quite "sharp".  He does have history of NPH but not very ataxic usually.  Apparently patient's daughter and wife went out and when returned home, they found patient on the floor not knowing how he got there.  He appeared to have vomited.  He was apparently doing well and at baseline before they went out.  No report of family noting fever or chills or diarrhea or cough preceding the episode.  Patient has video monitor in the room but son concerned that he is not able to be redirected.  ED course: Labs with AKI,creatinine 1.51 and hypokalemia .CT C-spine and head negative admission. Chest x-ray noted acute infiltrate.CT angiogram 07/05/2019-negative PE-bilateral lower lobe atelectasis versus infiltrate.  Hospital course: Patient admitted to [facility] for acute hypoxic respiratory failure and sepsis due to presumed pneumonia with IV antibiotics cefepime with Zithromax-MRSA PCR negative and therefore vancomycin discontinued.  Hospital course complicated by ongoing delirium as well as episode of hypoxia with O2 sats in the 70s and unresponsiveness on the night of 2/15 when RRT was summoned.  07/09/19: somnolent this AM. K+ low this AM, replace. Lab work otherwise ok. Mentation is slowly improving per report, but he is somnolent during my interview. PT/OT eval? Family is going to need help at home.    Assessment & Plan:   Principal Problem:   Acute respiratory failure with hypoxia (HCC) Active Problems:   HTN (hypertension)   Pneumonia   Tachycardia   AMS  (altered mental status)  Acute hypoxic respiratory failure Sepsis syndrome ?Aspiration     - Patient febrile, tachycardic with leukocytosis on presentation.     - CT angiogram 07/05/2019:negative PE;bilateral lower lobe atelectasis versus infiltrate reported.     - On Unasyn for suspected asp PNA     - Bld Cx NTD, UCX neg.     - now on 1L [place] and sats well     - complete abx in AM  Acute encephalopathy/delirium:     - Patient found on floor     - CT C-spine and head negative admission.       - Patient does have a history of NPH and is s/p VP shunt-not sure if patient had a mechanical fall and now confused due to head trauma/concussion.      - MRI head pending; will avoid Ativan/any benzodiazepine for pre MRI sedation as it might make his delirium worse.     - Neurology onboard, appreciate assistance.     - He did undergo EEG which was suggestive of generalized encephalopathy with no focal seizures.     - Continue delirium/aspiration precautions.       - Continue low-dose Seroquel      - continue melatonin/B12 supplements/aspirin that he takes at home.       - d/c IVF in AM  Acute kidney injury     - Due to dehydration versus sepsis with admitting creatinine 1.51     - resolved  Hypokalemia      - On HCTZ and potassium 10 mEq at home.     -  increase K+ to 53mEq and also add IV run of 40mEq  Hypertension     - HCTZ held on admission.      - SBP up at 140-170     - normalized with addition of low-dose Norvasc  Prolonged QTC      - Previous EKG from December 2020 showed prolonged QT interval at 520 ms.       - Repeat EKG shows significant improvement with normal QT C at 440 ms.       - Monitor periodically while utilizing psych meds/Seroquel.       - Replace electrolytes to keep potassium close to 4 and magnesium to 2.  History of diabetes mellitus     - not on any medications at home.      - Hemoglobin A1c at 6.0.     - Glucose monitoring with sliding scale insulin as  needed  GERD:      - continue protonix   DVT prophylaxis: lovenox Code Status: FULL Family Communication: None at bedside   Disposition Plan: Ongoing w/u  Consultants:   Neurology  Antimicrobials:  . Unasyn   Subjective: No acute events ON  Objective: Vitals:   07/09/19 0345 07/09/19 0743 07/09/19 1228 07/09/19 1627  BP:  (!) 147/86 (!) 145/89 136/83  Pulse:  80  80  Resp:  (!) 21 20 (!) 22  Temp: 98.2 F (36.8 C) 98 F (36.7 C) 98.8 F (37.1 C) 100.1 F (37.8 C)  TempSrc: Axillary Oral Axillary Oral  SpO2:  98% 97% 100%  Weight:      Height:        Intake/Output Summary (Last 24 hours) at 07/09/2019 1630 Last data filed at 07/09/2019 1444 Gross per 24 hour  Intake 1562.12 ml  Output 1350 ml  Net 212.12 ml   Filed Weights   07/05/19 0019 07/05/19 1209  Weight: 80 kg 80 kg    Examination:  General: 79 y.o. male resting in bed in NAD Cardiovascular: RRR, +S1, S2, no m/g/r, equal pulses throughout Respiratory: CTABL, no w/r/r, normal WOB GI: BS+, NDNT, no masses noted, no organomegaly noted MSK: No e/c/c Neuro: somnolent  Data Reviewed: I have personally reviewed following labs and imaging studies.  CBC: Recent Labs  Lab 07/04/19 2332 07/04/19 2332 07/05/19 0700 07/06/19 0248 07/07/19 0255 07/08/19 0914 07/09/19 0241  WBC 13.0*   < > 17.0* 11.6* 11.6* 7.2 7.0  NEUTROABS 12.1*  --   --   --   --   --   --   HGB 16.5   < > 14.6 13.0 14.6 14.2 14.8  HCT 51.1   < > 43.8 40.4 44.5 42.4 44.7  MCV 96.6   < > 94.2 95.7 94.3 93.0 93.3  PLT 175   < > 156 155 159 183 184   < > = values in this interval not displayed.   Basic Metabolic Panel: Recent Labs  Lab 07/05/19 0700 07/06/19 0248 07/07/19 0255 07/08/19 0914 07/08/19 1450 07/09/19 0241  NA 141 142 142 142  --  139  K 3.4* 3.1* 3.8 3.2*  --  3.2*  CL 102 102 105 103  --  98  CO2 25 27 28 28   --  28  GLUCOSE 210* 129* 111* 123*  --  119*  BUN 16 16 17 22   --  20  CREATININE 1.41* 1.23  1.14 1.17  --  1.03  CALCIUM 8.5* 8.5* 8.9 8.8*  --  8.7*  MG  --   --   --   --  1.9 1.8   GFR: Estimated Creatinine Clearance: 58.2 mL/min (by C-G formula based on SCr of 1.03 mg/dL). Liver Function Tests: Recent Labs  Lab 07/04/19 2332 07/05/19 0700 07/06/19 0248 07/07/19 0255  AST 21 25 57* 63*  ALT 11 11 12 15   ALKPHOS 53 40 36* 41  BILITOT 1.2 0.7 1.4* 1.7*  PROT 6.8 5.6* 5.4* 6.2*  ALBUMIN 3.8 3.0* 2.9* 3.0*   No results for input(s): LIPASE, AMYLASE in the last 168 hours. Recent Labs  Lab 07/05/19 0700  AMMONIA 26   Coagulation Profile: Recent Labs  Lab 07/04/19 2332  INR 1.0   Cardiac Enzymes: Recent Labs  Lab 07/04/19 2332  CKTOTAL 44*   BNP (last 3 results) No results for input(s): PROBNP in the last 8760 hours. HbA1C: Recent Labs    07/07/19 1947  HGBA1C 6.0*   CBG: Recent Labs  Lab 07/07/19 2216 07/08/19 2226  GLUCAP 102* 107*   Lipid Profile: No results for input(s): CHOL, HDL, LDLCALC, TRIG, CHOLHDL, LDLDIRECT in the last 72 hours. Thyroid Function Tests: No results for input(s): TSH, T4TOTAL, FREET4, T3FREE, THYROIDAB in the last 72 hours. Anemia Panel: No results for input(s): VITAMINB12, FOLATE, FERRITIN, TIBC, IRON, RETICCTPCT in the last 72 hours. Sepsis Labs: Recent Labs  Lab 07/04/19 2332 07/05/19 0215 07/05/19 0700  LATICACIDVEN 2.6* 4.8* 3.7*    Recent Results (from the past 240 hour(s))  Culture, blood (Routine x 2)     Status: None (Preliminary result)   Collection Time: 07/04/19 11:34 PM   Specimen: BLOOD  Result Value Ref Range Status   Specimen Description BLOOD LEFT ANTECUBITAL  Final   Special Requests   Final    BOTTLES DRAWN AEROBIC AND ANAEROBIC Blood Culture results may not be optimal due to an inadequate volume of blood received in culture bottles   Culture   Final    NO GROWTH 4 DAYS Performed at [facility] [address] [place], [place] [address]    Report Status PENDING  Incomplete    Culture, blood (Routine x 2)     Status: None (Preliminary result)   Collection Time: 07/04/19 11:34 PM   Specimen: BLOOD  Result Value Ref Range Status   Specimen Description BLOOD RIGHT ANTECUBITAL  Final   Special Requests   Final    BOTTLES DRAWN AEROBIC AND ANAEROBIC Blood Culture results may not be optimal due to an excessive volume of blood received in culture bottles   Culture   Final    NO GROWTH 4 DAYS Performed at [facility], [place] [address] [place], [place] [address]    Report Status PENDING  Incomplete  Urine culture     Status: None   Collection Time: 07/04/19 11:44 PM   Specimen: Urine, Catheterized  Result Value Ref Range Status   Specimen Description URINE, CATHETERIZED  Final   Special Requests NONE  Final   Culture   Final    NO GROWTH Performed at [facility], [address], [place], [place] [address]    Report Status 07/06/2019 FINAL  Final  Respiratory Panel by RT PCR (Flu A&B, Covid) - Nasopharyngeal Swab     Status: None   Collection Time: 07/05/19 12:36 AM   Specimen: Nasopharyngeal Swab  Result Value Ref Range Status   SARS Coronavirus 2 by RT PCR NEGATIVE NEGATIVE Final    Comment: (NOTE) SARS-CoV-2 target nucleic acids are NOT DETECTED. The SARS-CoV-2 RNA is generally detectable in upper respiratoy specimens  during the acute phase of infection. The lowest concentration of SARS-CoV-2 viral copies this assay can detect is 131 copies/mL. A negative result does not preclude SARS-Cov-2 infection and should not be used as the sole basis for treatment or other patient management decisions. A negative result may occur with  improper specimen collection/handling, submission of specimen other than nasopharyngeal swab, presence of viral mutation(s) within the areas targeted by this assay, and inadequate number of viral copies (<131 copies/mL). A negative result must be combined with clinical observations, patient history, and epidemiological  information. The expected result is Negative. Fact Sheet for Patients:  PinkCheek.be Fact Sheet for Healthcare Providers:  GravelBags.it This test is not yet ap proved or cleared by the [place] FDA and  has been authorized for detection and/or diagnosis of SARS-CoV-2 by FDA under an Emergency Use Authorization (EUA). This EUA will remain  in effect (meaning this test can be used) for the duration of the COVID-19 declaration under Section 564(b)(1) of the Act, 21 U.S.C. section 360bbb-3(b)(1), unless the authorization is terminated or revoked sooner.    Influenza A by PCR NEGATIVE NEGATIVE Final   Influenza B by PCR NEGATIVE NEGATIVE Final    Comment: (NOTE) The Xpert Xpress SARS-CoV-2/FLU/RSV assay is intended as an aid in  the diagnosis of influenza from Nasopharyngeal swab specimens and  should not be used as a sole basis for treatment. Nasal washings and  aspirates are unacceptable for Xpert Xpress SARS-CoV-2/FLU/RSV  testing. Fact Sheet for Patients: PinkCheek.be Fact Sheet for Healthcare Providers: GravelBags.it This test is not yet approved or cleared by the [place] FDA and  has been authorized for detection and/or diagnosis of SARS-CoV-2 by  FDA under an Emergency Use Authorization (EUA). This EUA will remain  in effect (meaning this test can be used) for the duration of the  Covid-19 declaration under Section 564(b)(1) of the Act, 21  U.S.C. section 360bbb-3(b)(1), unless the authorization is  terminated or revoked. Performed at [facility], [place] [address], [place], [place] [address]   MRSA PCR Screening     Status: None   Collection Time: 07/05/19  6:11 AM   Specimen: Nasal Mucosa; Nasopharyngeal  Result Value Ref Range Status   MRSA by PCR NEGATIVE NEGATIVE Final    Comment:        The GeneXpert MRSA Assay (FDA approved for NASAL  specimens only), is one component of a comprehensive MRSA colonization surveillance program. It is not intended to diagnose MRSA infection nor to guide or monitor treatment for MRSA infections. Performed at [facility], [place] [address], [place], [place] [address]       Radiology Studies: DG Chest [place] 1 View  Result Date: 07/07/2019 CLINICAL DATA:  Diabetes, hypertension, prostate cancer, altered level of consciousness, fever EXAM: PORTABLE CHEST 1 VIEW COMPARISON:  07/05/2019, 07/04/2019 FINDINGS: Single frontal view of the chest demonstrates a stable cardiac silhouette. Ventriculostomy catheter overlying right chest unchanged. Chronic elevation of the right hemidiaphragm with right basilar atelectasis unchanged. There is increasing consolidation at the left lung base. Small left effusion not excluded. No pneumothorax. IMPRESSION: 1. Progressive left basilar consolidation with likely small left pleural effusion. This could be related to atelectasis given previous CT findings. 2. Chronic elevation right hemidiaphragm with compressive atelectasis right base. Electronically Signed   By: Randa Ngo M.D.   On: 07/07/2019 23:18   EEG adult  Result Date: 07/08/2019 Lora Havens, MD     07/08/2019 10:42 AM Patient  Name: Jeremiah [name] MRN: [id number] Epilepsy Attending: Lora Havens Referring Physician/Provider: Dr. Guilford Shi Date: 07/08/2019 Duration: 25.43 minutes Patient history: 79 year old male with history of NPH status post shunting presented with acute respiratory failure, found to be septic.  Patient continues to be altered.  Therefore EEG to evaluate for seizures. Level of alertness: Awake/lethargic AEDs during EEG study: None Technical aspects: This EEG study was done with scalp electrodes positioned according to the 10-20 International system of electrode placement. Electrical activity was acquired at a sampling rate of 500Hz  and reviewed with a high frequency filter  of 70Hz  and a low frequency filter of 1Hz . EEG data were recorded continuously and digitally stored. Description: No clear posterior dominant was seen.  EEG showed continuous generalized 3 to 6 Hz theta and delta slowing.  Hyperventilation photic summation were not performed. Abnormality -Continuous slow, generalized IMPRESSION: This study is suggestive of moderate diffuse encephalopathy, nonspecific etiology. No seizures or epileptiform discharges were seen throughout the recording. Priyanka Barbra Sarks     Scheduled Meds: . amLODipine  2.5 mg Oral Daily  . aspirin EC  81 mg Oral Daily  . Chlorhexidine Gluconate Cloth  6 each Topical Daily  . enoxaparin (LOVENOX) injection  40 mg Subcutaneous Q24H  . feeding supplement (ENSURE ENLIVE)  237 mL Oral BID BM  . feeding supplement (PRO-STAT SUGAR FREE 64)  30 mL Oral Daily  . influenza vaccine adjuvanted  0.5 mL Intramuscular Tomorrow-1000  . multivitamin with minerals  1 tablet Oral Daily  . pantoprazole  20 mg Oral QODAY  . [START ON 07/10/2019] potassium chloride SA  40 mEq Oral Daily  . QUEtiapine  25 mg Oral QHS  . senna-docusate  1 tablet Oral Daily  . vitamin B-12  1,000 mcg Oral Daily   Continuous Infusions: . ampicillin-sulbactam (UNASYN) IV 1.5 g (07/09/19 1442)  . dextrose 5% lactated ringers 75 mL/hr at 07/09/19 1130  . potassium chloride       LOS: 4 days    Time spent: 25 minutes spent in the coordination of care today.    Jonnie Finner, DO [facility]  If 7PM-7AM, please contact night-coverage [email or web address] 07/09/2019, 4:30 PM

## 2019-07-09 NOTE — Progress Notes (Signed)
RN notified MD of 3.2 K+ this morning. No new orders.

## 2019-07-09 NOTE — Progress Notes (Signed)
Subjective: No acute events overnight.  Per RN, patient has been able to eat about half of his meal, was more communicative and responsive this morning.  ROS: Unable to obtain due to poor mental status  Examination  Vital signs in last 24 hours: Temp:  [98 F (36.7 C)-99.5 F (37.5 C)] 98.8 F (37.1 C) (02/17 1228) Pulse Rate:  [80] 80 (02/17 0743) Resp:  [20-23] 20 (02/17 1228) BP: (128-155)/(69-89) 145/89 (02/17 1228) SpO2:  [95 %-98 %] 97 % (02/17 1228)  General: lying in bed, not in apparent distress CVS: pulse-normal rate and rhythm RS: breathing comfortably Extremities: normal, warm  Neuro: MS: Awake, watching TV, knows his name, did not say where he is or what time it is, but after asking repeatedly was able to follow simple commands, do simple math like 2+2 CN: pupils equal and reactive,  EOMI, face symmetric, tongue midline, normal sensation over face Motor: 4/5 strength in all 4 extremities  Basic Metabolic Panel: Recent Labs  Lab 07/05/19 0700 07/05/19 0700 07/06/19 0248 07/06/19 0248 07/07/19 0255 07/08/19 0914 07/08/19 1450 07/09/19 0241  NA 141  --  142  --  142 142  --  139  K 3.4*  --  3.1*  --  3.8 3.2*  --  3.2*  CL 102  --  102  --  105 103  --  98  CO2 25  --  27  --  28 28  --  28  GLUCOSE 210*  --  129*  --  111* 123*  --  119*  BUN 16  --  16  --  17 22  --  20  CREATININE 1.41*  --  1.23  --  1.14 1.17  --  1.03  CALCIUM 8.5*   < > 8.5*   < > 8.9 8.8*  --  8.7*  MG  --   --   --   --   --   --  1.9 1.8   < > = values in this interval not displayed.    CBC: Recent Labs  Lab 07/04/19 2332 07/04/19 2332 07/05/19 0700 07/06/19 0248 07/07/19 0255 07/08/19 0914 07/09/19 0241  WBC 13.0*   < > 17.0* 11.6* 11.6* 7.2 7.0  NEUTROABS 12.1*  --   --   --   --   --   --   HGB 16.5   < > 14.6 13.0 14.6 14.2 14.8  HCT 51.1   < > 43.8 40.4 44.5 42.4 44.7  MCV 96.6   < > 94.2 95.7 94.3 93.0 93.3  PLT 175   < > 156 155 159 183 184   < > = values  in this interval not displayed.     Coagulation Studies: No results for input(s): LABPROT, INR in the last 72 hours.  Imaging No new brain imaging    ASSESSMENT AND PLAN: 79 year old male with history of NPH status post VP shunt placement who presented with altered mental status and was found to have pneumonia.  Neurology was consulted due to concern for seizures.  Transient alteration of awareness  Acute encephalopathy, likely infectious Acute respiratory failure Suspected pneumonia Acute kidney injury (resolved) Hypoalbuminemia  Transaminitis Hyperbilirubinemia -Encephalopathy in setting of fall, suspected pneumonia in an elderly male with NPH -Routine EEG consistent with encephalopathy -CT head did not show any evidence of increased ICP -TSH, ammonia, B12 within normal limits  Recommendations -Semiology of the episode that patient had on 07/08/2019 along with EEG findings is  very less likely to be a seizure.  Therefore will not start any antiepileptic drugs at this point -MRI brain without contrast has been ordered by primary team.  Patient did not have any focal neurological deficits on exam, therefore low suspicion for a large stroke.  Weighing the benefits and the risks of sedating the patient for MRI versus waiting, I would recommend proceeding with MRI brain if patient can tolerate it without sedation.  However patient require sedation, I would recommend waiting till patient's mental status improves -Patient is continuing to improve every day.  He might require longer time (days to week) for complete neurologic recovery due to pre-existing neurologic issues -Continue delirium precautions -Minimize sedating meds.  However okay to continue low-dose Seroquel if needed while inpatient -Rest of the management per primary team   Thank you for allowing [place] to participate in the care of this patient.  Neurology will sign off.  Please page neuro hospitalist for any further  questions after 5 PM.  I have spent a total of 25 minutes with the patient reviewing hospital notes,  test results, labs and examining the patient as well as establishing an assessment and plan that was discussed personally with the patient and his RN, son stepped out earlier.  > 50% of time was spent in direct patient care.    Jeremiah [name]

## 2019-07-09 NOTE — Progress Notes (Signed)
  Speech Language Pathology Treatment: Dysphagia  Patient Details Name: Jeremiah [name] MRN: [id number] DOB: [birth year] Today's Date: 07/09/2019 Time: 3094-0768 SLP Time Calculation (min) (ACUTE ONLY): 20 min  Assessment / Plan / Recommendation Clinical Impression  Pt seen at bedside to assess diet tolerance and provide education. Pt was observed during lunch with regular texture solids and thin liquids. Pt requires significant assistance with self feeding. No overt s/s aspiration observed on any consistency, however, recommend downgrade to mechanical soft solids with chopped meats primarily for energy conservation. Safe swallow precautions were posted at [facility] and son was receptive to education regarding downgrade and rationale. SLP will continue to follow briefly to assess diet tolerance and provide additional education.     HPI HPI: 79 y.o. male, medical history including but not limited to hypertension, Dm2, prostate cancer s/p XRT, OSA on CPAP, NPH s/p VP shunt, presenting with altered mental status associated with fever, tachycardia, hypoxia and tachypnea after being found on the floor. Per chart son reports patient at baseline is quite "sharp". Per MD note suspicous pt may "have had another aspiration event."  had episode last night of unresponsiveness with hypoxia. Noted to be diaphoretic, foaming at his mouth-briefly placed on NRB mask and then repositioned-was quickly able to taper O2 down to 4 L returning sats to 100%.  CXR Progressive left basilar consolidation with likely small left pleural effusion. This could be related to atelectasis given previous CT findings. 2. Chronic elevation right hemidiaphragm with compressive atelectasis right base.      SLP Plan  Continue with current plan of care       Recommendations  Diet recommendations: Dysphagia 3 (mechanical soft);Thin liquid Liquids provided via: Cup;Straw Medication Administration: Whole meds with liquid Supervision: Patient  able to self feed;Staff to assist with self feeding;Full supervision/cueing for compensatory strategies Compensations: Minimize environmental distractions;Slow rate;Small sips/bites Postural Changes and/or Swallow Maneuvers: Seated upright 90 degrees                Oral Care Recommendations: Oral care BID Follow up Recommendations: Other (comment)(TBD) Plan: Continue with current plan of care       [place]. Quentin Ore, [facility], [place] Speech Language Pathologist Office: [phone] Pager: [phone]  Shonna Chock 07/09/2019, 2:34 PM

## 2019-07-10 LAB — CBC WITH DIFFERENTIAL/PLATELET
Abs Immature Granulocytes: 0.1 10*3/uL — ABNORMAL HIGH (ref 0.00–0.07)
Basophils Absolute: 0 10*3/uL (ref 0.0–0.1)
Basophils Relative: 0 %
Eosinophils Absolute: 0.3 10*3/uL (ref 0.0–0.5)
Eosinophils Relative: 4 %
HCT: 46 % (ref 39.0–52.0)
Hemoglobin: 15.2 g/dL (ref 13.0–17.0)
Immature Granulocytes: 2 %
Lymphocytes Relative: 13 %
Lymphs Abs: 0.9 10*3/uL (ref 0.7–4.0)
MCH: 31.1 pg (ref 26.0–34.0)
MCHC: 33 g/dL (ref 30.0–36.0)
MCV: 94.1 fL (ref 80.0–100.0)
Monocytes Absolute: 0.6 10*3/uL (ref 0.1–1.0)
Monocytes Relative: 9 %
Neutro Abs: 4.9 10*3/uL (ref 1.7–7.7)
Neutrophils Relative %: 72 %
Platelets: 197 10*3/uL (ref 150–400)
RBC: 4.89 MIL/uL (ref 4.22–5.81)
RDW: 12.6 % (ref 11.5–15.5)
WBC: 6.8 10*3/uL (ref 4.0–10.5)
nRBC: 0 % (ref 0.0–0.2)

## 2019-07-10 LAB — CULTURE, BLOOD (ROUTINE X 2)
Culture: NO GROWTH
Culture: NO GROWTH

## 2019-07-10 LAB — COMPREHENSIVE METABOLIC PANEL
ALT: 14 U/L (ref 0–44)
AST: 30 U/L (ref 15–41)
Albumin: 2.8 g/dL — ABNORMAL LOW (ref 3.5–5.0)
Alkaline Phosphatase: 39 U/L (ref 38–126)
Anion gap: 9 (ref 5–15)
BUN: 24 mg/dL — ABNORMAL HIGH (ref 8–23)
CO2: 29 mmol/L (ref 22–32)
Calcium: 8.8 mg/dL — ABNORMAL LOW (ref 8.9–10.3)
Chloride: 101 mmol/L (ref 98–111)
Creatinine, Ser: 1.14 mg/dL (ref 0.61–1.24)
GFR calc Af Amer: >60 mL/min (ref >60)
GFR calc non Af Amer: >60 mL/min (ref >60)
Glucose, Bld: 122 mg/dL — ABNORMAL HIGH (ref 70–99)
Potassium: 3.7 mmol/L (ref 3.5–5.1)
Sodium: 139 mmol/L (ref 135–145)
Total Bilirubin: 0.9 mg/dL (ref 0.3–1.2)
Total Protein: 5.8 g/dL — ABNORMAL LOW (ref 6.5–8.1)

## 2019-07-10 LAB — MAGNESIUM: Magnesium: 1.9 mg/dL (ref 1.7–2.4)

## 2019-07-10 NOTE — Progress Notes (Signed)
[name]  PROGRESS NOTE    Jeremiah [name]  TDV:[id number] DOB: 02/11/1941 DOA: 07/04/2019 PCP: [name], MD   Brief Narrative:   79 y.o.male,medical history including but not limited to hypertension, Dm2, prostate cancer s/p XRT, OSA on CPAP, NPH s/p VP shunt, presenting with altered mental status associated with fever, tachycardia, hypoxia and tachypnea after being found on the floor.According to son, patient at baseline is quite "sharp". He does have history of NPH but not very ataxic usually. Apparently patient's daughter and wife went out and when returned home, they found patient on the floor not knowing how he got there. He appeared to have vomited. He was apparently doing well and at baseline before they went out. No report of family noting fever or chills or diarrhea or cough preceding the episode. Patient has video monitor in the room but son concerned that he is not able to be redirected.  ED course:Labs with AKI,creatinine 1.51 and hypokalemia .CT C-spine and head negative admission. Chest x-ray noted acute infiltrate.CT angiogram 07/05/2019-negative PE-bilateral lower lobe atelectasis versus infiltrate.  Hospital course: Patient admitted to [facility] for acute hypoxic respiratory failure and sepsis due to presumed pneumonia with IV antibiotics cefepime with Zithromax-MRSA PCR negative and therefore vancomycin discontinued.Hospital course complicated by ongoing delirium as well as episode of hypoxia with O2 sats in the 70s and unresponsiveness on the night of 2/15 when RRT was summoned.  07/10/19: Sleeping again this AM. Spoke with son. Says he's noticed a big improvement in mentation, but he is not back to baseline. Spoke with neuro. D/c MRI. We need to encourage activity. Will consult PT. D/c fluids today. Abx end today. Take seroquel off MAR.    Assessment & Plan:   Principal Problem:   Acute respiratory failure with hypoxia (HCC) Active Problems:   HTN (hypertension)  Pneumonia   Tachycardia   AMS (altered mental status)  Acute hypoxic respiratory failure Sepsis syndrome ?Aspiration     - Patient febrile, tachycardic with leukocytosis on presentation.     - CT angiogram 07/05/2019:negative PE;bilateral lower lobe atelectasis versus infiltrate reported.     - On Unasyn for suspected asp PNA     - Bld Cx NTD, UCX neg.     - now on 1L [place] and sats well     - 07/10/19: abx complete today  Acute encephalopathy Delirium     - Patient found on floor     - CT C-spine and head negative admission.       - Patient does have a history of NPH and is s/p VP shunt-not sure if patient had a mechanical fall and now confused due to head trauma/concussion.      - MRI head pending; will avoid Ativan/any benzodiazepine for pre MRI sedation as it might make his delirium worse.     - Neurology onboard, appreciate assistance.     - He did undergo EEG which was suggestive of generalized encephalopathy with no focal seizures.     - Continue delirium/aspiration precautions.      - continue melatonin/B12 supplements/aspirin that he takes at home.       - 07/10/19: d/c quetiapine; let's encourage activity, get him up OOB, consult PT     - spoke with neurology; no MRI needed at this point  Acute kidney injury     - Due to dehydration versus sepsis with admitting creatinine 1.51     - resolved     - 07/10/19: d/c fluids  Hypokalemia      -  On HCTZ and potassium 10 mEq at home.     - increase K+ to 53mEq and also add IV run of 46mEq     - 07/10/19: K+ is ok today; Mg2+ ok  Hypertension     - HCTZ held on admission.      - SBP up at 140-170     - normalized with addition of low-dose Norvasc  Prolonged QTC      - Previous EKG from December 2020 showed prolonged QT interval at 520 ms.       - Repeat EKG shows significant improvement with normal QT C at 440 ms.       - Monitor periodically while utilizing psych meds/Seroquel.       - Replace electrolytes to keep  potassium close to 4 and magnesium to 2.  History of diabetes mellitus     - not on any medications at home.      - Hemoglobin A1c at 6.0.     - Glucose monitoring with sliding scale insulin as needed  GERD:      - continue protonix   DVT prophylaxis: lovenox Code Status: FULL Family Communication: Spoke with son by phone   Disposition Plan: Still not at baseline mentation.  Consultants:   Neurology  Antimicrobials:  . Unasyn   Subjective: Febrile ON, but responsive to APAP.  Objective: Vitals:   07/10/19 0614 07/10/19 0751 07/10/19 0823 07/10/19 1144  BP: (!) 155/87 126/81  114/60  Pulse: 78 79    Resp: (!) 24 (!) 24    Temp:  100.1 F (37.8 C) (!) 101.2 F (38.4 C) 98.3 F (36.8 C)  TempSrc:  Oral Oral Oral  SpO2: 92% 92% 94% 96%  Weight:      Height:        Intake/Output Summary (Last 24 hours) at 07/10/2019 1504 Last data filed at 07/09/2019 1832 Gross per 24 hour  Intake 993.45 ml  Output -  Net 993.45 ml   Filed Weights   07/05/19 0019 07/05/19 1209  Weight: 80 kg 80 kg    Examination:  General: 79 y.o. male resting in bed in NAD Cardiovascular: RRR, +S1, S2, no m/g/r Respiratory: CTABL, no w/r/r, normal WOB GI: BS+, NDNT, no masses noted, soft MSK: No e/c/c Skin: No rashes, bruises, ulcerations noted Neuro: sleeping  Data Reviewed: I have personally reviewed following labs and imaging studies.  CBC: Recent Labs  Lab 07/04/19 2332 07/05/19 0700 07/06/19 0248 07/07/19 0255 07/08/19 0914 07/09/19 0241 07/10/19 0246  WBC 13.0*   < > 11.6* 11.6* 7.2 7.0 6.8  NEUTROABS 12.1*  --   --   --   --   --  4.9  HGB 16.5   < > 13.0 14.6 14.2 14.8 15.2  HCT 51.1   < > 40.4 44.5 42.4 44.7 46.0  MCV 96.6   < > 95.7 94.3 93.0 93.3 94.1  PLT 175   < > 155 159 183 184 197   < > = values in this interval not displayed.   Basic Metabolic Panel: Recent Labs  Lab 07/06/19 0248 07/07/19 0255 07/08/19 0914 07/08/19 1450 07/09/19 0241 07/10/19  0246  NA 142 142 142  --  139 139  K 3.1* 3.8 3.2*  --  3.2* 3.7  CL 102 105 103  --  98 101  CO2 27 28 28   --  28 29  GLUCOSE 129* 111* 123*  --  119* 122*  BUN 16 17 22   --  20 24*  CREATININE 1.23 1.14 1.17  --  1.03 1.14  CALCIUM 8.5* 8.9 8.8*  --  8.7* 8.8*  MG  --   --   --  1.9 1.8 1.9   GFR: Estimated Creatinine Clearance: 52.5 mL/min (by C-G formula based on SCr of 1.14 mg/dL). Liver Function Tests: Recent Labs  Lab 07/04/19 2332 07/05/19 0700 07/06/19 0248 07/07/19 0255 07/10/19 0246  AST 21 25 57* 63* 30  ALT 11 11 12 15 14   ALKPHOS 53 40 36* 41 39  BILITOT 1.2 0.7 1.4* 1.7* 0.9  PROT 6.8 5.6* 5.4* 6.2* 5.8*  ALBUMIN 3.8 3.0* 2.9* 3.0* 2.8*   No results for input(s): LIPASE, AMYLASE in the last 168 hours. Recent Labs  Lab 07/05/19 0700  AMMONIA 26   Coagulation Profile: Recent Labs  Lab 07/04/19 2332  INR 1.0   Cardiac Enzymes: Recent Labs  Lab 07/04/19 2332  CKTOTAL 44*   BNP (last 3 results) No results for input(s): PROBNP in the last 8760 hours. HbA1C: Recent Labs    07/07/19 1947  HGBA1C 6.0*   CBG: Recent Labs  Lab 07/07/19 2216 07/08/19 2226  GLUCAP 102* 107*   Lipid Profile: No results for input(s): CHOL, HDL, LDLCALC, TRIG, CHOLHDL, LDLDIRECT in the last 72 hours. Thyroid Function Tests: No results for input(s): TSH, T4TOTAL, FREET4, T3FREE, THYROIDAB in the last 72 hours. Anemia Panel: No results for input(s): VITAMINB12, FOLATE, FERRITIN, TIBC, IRON, RETICCTPCT in the last 72 hours. Sepsis Labs: Recent Labs  Lab 07/04/19 2332 07/05/19 0215 07/05/19 0700  LATICACIDVEN 2.6* 4.8* 3.7*    Recent Results (from the past 240 hour(s))  Culture, blood (Routine x 2)     Status: None   Collection Time: 07/04/19 11:34 PM   Specimen: BLOOD  Result Value Ref Range Status   Specimen Description BLOOD LEFT ANTECUBITAL  Final   Special Requests   Final    BOTTLES DRAWN AEROBIC AND ANAEROBIC Blood Culture results may not be  optimal due to an inadequate volume of blood received in culture bottles   Culture   Final    NO GROWTH 5 DAYS Performed at [facility] [address], [place], [place] [address]    Report Status 07/10/2019 FINAL  Final  Culture, blood (Routine x 2)     Status: None   Collection Time: 07/04/19 11:34 PM   Specimen: BLOOD  Result Value Ref Range Status   Specimen Description BLOOD RIGHT ANTECUBITAL  Final   Special Requests   Final    BOTTLES DRAWN AEROBIC AND ANAEROBIC Blood Culture results may not be optimal due to an excessive volume of blood received in culture bottles   Culture   Final    NO GROWTH 5 DAYS Performed at [facility], [place] [address] [place], [place] [address]    Report Status 07/10/2019 FINAL  Final  Urine culture     Status: None   Collection Time: 07/04/19 11:44 PM   Specimen: Urine, Catheterized  Result Value Ref Range Status   Specimen Description URINE, CATHETERIZED  Final   Special Requests NONE  Final   Culture   Final    NO GROWTH Performed at [facility] [address] [place], [place] [address]    Report Status 07/06/2019 FINAL  Final  Respiratory Panel by RT PCR (Flu A&B, Covid) - Nasopharyngeal Swab     Status: None   Collection Time: 07/05/19 12:36 AM   Specimen: Nasopharyngeal Swab  Result Value Ref Range Status   SARS Coronavirus 2 by RT PCR NEGATIVE NEGATIVE Final    Comment: (NOTE) SARS-CoV-2 target nucleic acids are NOT DETECTED. The SARS-CoV-2 RNA is generally detectable in upper respiratoy specimens during the acute phase of infection. The lowest concentration of SARS-CoV-2 viral copies this assay can detect is 131 copies/mL. A negative result does not preclude SARS-Cov-2 infection and should not be used as the sole basis for treatment or other patient management decisions. A negative result may occur with  improper specimen collection/handling, submission of specimen other than nasopharyngeal swab, presence of  viral mutation(s) within the areas targeted by this assay, and inadequate number of viral copies (<131 copies/mL). A negative result must be combined with clinical observations, patient history, and epidemiological information. The expected result is Negative. Fact Sheet for Patients:  PinkCheek.be Fact Sheet for Healthcare Providers:  GravelBags.it This test is not yet ap proved or cleared by the [place] FDA and  has been authorized for detection and/or diagnosis of SARS-CoV-2 by FDA under an Emergency Use Authorization (EUA). This EUA will remain  in effect (meaning this test can be used) for the duration of the COVID-19 declaration under Section 564(b)(1) of the Act, 21 U.S.C. section 360bbb-3(b)(1), unless the authorization is terminated or revoked sooner.    Influenza A by PCR NEGATIVE NEGATIVE Final   Influenza B by PCR NEGATIVE NEGATIVE Final    Comment: (NOTE) The Xpert Xpress SARS-CoV-2/FLU/RSV assay is intended as an aid in  the diagnosis of influenza from Nasopharyngeal swab specimens and  should not be used as a sole basis for treatment. Nasal washings and  aspirates are unacceptable for Xpert Xpress SARS-CoV-2/FLU/RSV  testing. Fact Sheet for Patients: PinkCheek.be Fact Sheet for Healthcare Providers: GravelBags.it This test is not yet approved or cleared by the [place] FDA and  has been authorized for detection and/or diagnosis of SARS-CoV-2 by  FDA under an Emergency Use Authorization (EUA). This EUA will remain  in effect (meaning this test can be used) for the duration of the  Covid-19 declaration under Section 564(b)(1) of the Act, 21  U.S.C. section 360bbb-3(b)(1), unless the authorization is  terminated or revoked. Performed at [facility], [place] [address] [place], [place] [address]   MRSA PCR Screening     Status: None    Collection Time: 07/05/19  6:11 AM   Specimen: Nasal Mucosa; Nasopharyngeal  Result Value Ref Range Status   MRSA by PCR NEGATIVE NEGATIVE Final    Comment:        The GeneXpert MRSA Assay (FDA approved for NASAL specimens only), is one component of a comprehensive MRSA colonization surveillance program. It is not intended to diagnose MRSA infection nor to guide or monitor treatment for MRSA infections. Performed at [facility], [place] [address], [place], [place] [address]       Radiology Studies: No results found.   Scheduled Meds: . amLODipine  2.5 mg Oral Daily  . aspirin EC  81 mg Oral Daily  . Chlorhexidine Gluconate Cloth  6 each Topical Daily  . enoxaparin (LOVENOX) injection  40 mg Subcutaneous Q24H  . feeding supplement (ENSURE ENLIVE)  237 mL Oral BID BM  . feeding supplement (PRO-STAT SUGAR FREE 64)  30 mL Oral Daily  . influenza vaccine adjuvanted  0.5 mL Intramuscular Tomorrow-1000  . multivitamin with minerals  1 tablet Oral Daily  . pantoprazole  20 mg Oral QODAY  . potassium chloride SA  40  mEq Oral Daily  . QUEtiapine  25 mg Oral QHS  . senna-docusate  1 tablet Oral Daily  . vitamin B-12  1,000 mcg Oral Daily   Continuous Infusions: . ampicillin-sulbactam (UNASYN) IV 1.5 g (07/10/19 0836)  . dextrose 5% lactated ringers 75 mL/hr at 07/10/19 1112     LOS: 5 days    Time spent: 35 minutes spent in the coordination of care today.    Jonnie Finner, DO [facility]  If 7PM-7AM, please contact night-coverage [email or web address] 07/10/2019, 3:04 PM

## 2019-07-10 NOTE — Progress Notes (Signed)
Patient spoke with Son, and wife on the phone. Currently sitting up in bed watching TV. Consumed 240 ml ensure, and 10% of lunch.

## 2019-07-11 DIAGNOSIS — N179 Acute kidney failure, unspecified: Secondary | ICD-10-CM

## 2019-07-11 DIAGNOSIS — R5381 Other malaise: Secondary | ICD-10-CM

## 2019-07-11 DIAGNOSIS — R1310 Dysphagia, unspecified: Secondary | ICD-10-CM

## 2019-07-11 DIAGNOSIS — J690 Pneumonitis due to inhalation of food and vomit: Secondary | ICD-10-CM

## 2019-07-11 DIAGNOSIS — R531 Weakness: Secondary | ICD-10-CM

## 2019-07-11 LAB — CBC WITH DIFFERENTIAL/PLATELET
Abs Immature Granulocytes: 0.1 10*3/uL — ABNORMAL HIGH (ref 0.00–0.07)
Basophils Absolute: 0 10*3/uL (ref 0.0–0.1)
Basophils Relative: 1 %
Eosinophils Absolute: 0.2 10*3/uL (ref 0.0–0.5)
Eosinophils Relative: 3 %
HCT: 45.5 % (ref 39.0–52.0)
Hemoglobin: 15.2 g/dL (ref 13.0–17.0)
Immature Granulocytes: 2 %
Lymphocytes Relative: 11 %
Lymphs Abs: 0.7 10*3/uL (ref 0.7–4.0)
MCH: 31.3 pg (ref 26.0–34.0)
MCHC: 33.4 g/dL (ref 30.0–36.0)
MCV: 93.6 fL (ref 80.0–100.0)
Monocytes Absolute: 0.5 10*3/uL (ref 0.1–1.0)
Monocytes Relative: 7 %
Neutro Abs: 4.9 10*3/uL (ref 1.7–7.7)
Neutrophils Relative %: 76 %
Platelets: 196 10*3/uL (ref 150–400)
RBC: 4.86 MIL/uL (ref 4.22–5.81)
RDW: 12.6 % (ref 11.5–15.5)
WBC: 6.4 10*3/uL (ref 4.0–10.5)
nRBC: 0 % (ref 0.0–0.2)

## 2019-07-11 LAB — COMPREHENSIVE METABOLIC PANEL
ALT: 14 U/L (ref 0–44)
AST: 24 U/L (ref 15–41)
Albumin: 3 g/dL — ABNORMAL LOW (ref 3.5–5.0)
Alkaline Phosphatase: 36 U/L — ABNORMAL LOW (ref 38–126)
Anion gap: 11 (ref 5–15)
BUN: 19 mg/dL (ref 8–23)
CO2: 27 mmol/L (ref 22–32)
Calcium: 8.8 mg/dL — ABNORMAL LOW (ref 8.9–10.3)
Chloride: 101 mmol/L (ref 98–111)
Creatinine, Ser: 1.22 mg/dL (ref 0.61–1.24)
GFR calc Af Amer: >60 mL/min (ref >60)
GFR calc non Af Amer: 56 mL/min — ABNORMAL LOW (ref >60)
Glucose, Bld: 117 mg/dL — ABNORMAL HIGH (ref 70–99)
Potassium: 3.3 mmol/L — ABNORMAL LOW (ref 3.5–5.1)
Sodium: 139 mmol/L (ref 135–145)
Total Bilirubin: 0.9 mg/dL (ref 0.3–1.2)
Total Protein: 5.9 g/dL — ABNORMAL LOW (ref 6.5–8.1)

## 2019-07-11 LAB — GLUCOSE, CAPILLARY: Glucose-Capillary: 106 mg/dL — ABNORMAL HIGH (ref 70–99)

## 2019-07-11 MED ORDER — LIP MEDEX EX OINT
TOPICAL_OINTMENT | CUTANEOUS | Status: DC | PRN
  Filled 2019-07-11: qty 7

## 2019-07-11 MED ORDER — POTASSIUM CHLORIDE CRYS ER 20 MEQ PO TBCR
40.0000 meq | EXTENDED_RELEASE_TABLET | Freq: Two times a day (BID) | ORAL | Status: DC
  Administered 2019-07-12 – 2019-07-15 (×8): 40 meq via ORAL
  Filled 2019-07-11 (×9): qty 2

## 2019-07-11 NOTE — Evaluation (Signed)
Physical Therapy Evaluation Patient Details Name: Jeremiah [name] MRN: [id number] DOB: [birth year] Today's Date: 07/11/2019   History of Present Illness  Pt is 79 y.o. male,  medical history including but not limited to hypertension, Dm2, prostate cancer s/p XRT, OSA on CPAP, NPH s/p VP shunt, presenting with altered mental status associated with fever, tachycardia, hypoxia and tachypnea.  Pt admitted with acute resp failure and sepsis syndrome. CT Chest neg for PE.  CT head and cspine: no acute changes, shunt stable, degenerative changes in Cspine.  Clinical Impression  Pt admitted with above diagnosis. Pt required mod A for bed mobility and min A for transfer to chair.  Pt fatigued easily and required increased cues and time to respond.  Pt with stable vitals on RA.  Pt is significantly below baseline and would benefit from SNF at d/c.  Pt currently with functional limitations due to the deficits listed below (see PT Problem List). Pt will benefit from skilled PT to increase their independence and safety with mobility to allow discharge to the venue listed below.       Follow Up Recommendations SNF    Equipment Recommendations  Other (comment)(defer)    Recommendations for Other Services       Precautions / Restrictions Precautions Precautions: Fall      Mobility  Bed Mobility Overal bed mobility: Needs Assistance Bed Mobility: Supine to Sit     Supine to sit: Mod assist;HOB elevated     General bed mobility comments: assist for legs and to boost trunk  Transfers Overall transfer level: Needs assistance Equipment used: 1 person hand held assist Transfers: Sit to/from Stand;Stand Pivot Transfers Sit to Stand: Min assist;From elevated surface Stand pivot transfers: Min assist;From elevated surface       General transfer comment: pt used armrest of chair for support with stand pivot; HHA of PT for sit to stand; cues for posture  Ambulation/Gait Ambulation/Gait  assistance: Min assist Gait Distance (Feet): 2 Feet Assistive device: 1 person hand held assist Gait Pattern/deviations: Shuffle;Decreased stride length;Trunk flexed Gait velocity: decreased   General Gait Details: small steps to chair  Stairs            Wheelchair Mobility    Modified Rankin (Stroke Patients Only)       Balance Overall balance assessment: Needs assistance Sitting-balance support: Bilateral upper extremity supported;Feet supported Sitting balance-Leahy Scale: Poor Sitting balance - Comments: leaning L and posteriorly requiring min-mod A; gave verbal and visual cues   Standing balance support: During functional activity;Bilateral upper extremity supported Standing balance-Leahy Scale: Fair                               Pertinent Vitals/Pain Pain Assessment: No/denies pain    Home Living Family/patient expects to be discharged to:: Private residence Living Arrangements: Spouse/significant other Available Help at Discharge: Family Type of Home: House Home Access: Stairs to enter Entrance Stairs-Rails: Psychiatric nurse of Steps: 2 Home Layout: Laundry or work area in basement;Two level;Able to live on main level with bedroom/bathroom Home Equipment: Gilford Rile - 2 wheels;Cane - single point;Shower seat - built in Additional Comments: possible w/c and bsc- not sure    Prior Function Level of Independence: Needs assistance   Gait / Transfers Assistance Needed: Reports community ambulation  ADL's / Homemaking Assistance Needed: reports independent with ADLS  Comments: Reports has some assist with IADLs     Hand Dominance  Extremity/Trunk Assessment   Upper Extremity Assessment Upper Extremity Assessment: Generalized weakness    Lower Extremity Assessment Lower Extremity Assessment: Generalized weakness(Unable to hold balance at EOB for testing)    Cervical / Trunk Assessment Cervical / Trunk Assessment:  Kyphotic(forward head)  Communication   Communication: No difficulties  Cognition Arousal/Alertness: Awake/alert Behavior During Therapy: WFL for tasks assessed/performed Overall Cognitive Status: Impaired/Different from baseline Area of Impairment: Orientation;Following commands;Problem solving                 Orientation Level: Person     Following Commands: Follows one step commands with increased time;Follows one step commands inconsistently     Problem Solving: Slow processing;Decreased initiation;Requires verbal cues;Requires tactile cues;Difficulty sequencing        General Comments General comments (skin integrity, edema, etc.): Pt was on RA with sats mainly 91-93%, occasionally down to 88% but improved with 1-2 deep breaths.  BP and HR stable.    Exercises     Assessment/Plan    PT Assessment Patient needs continued PT services  PT Problem List Decreased strength;Decreased mobility;Decreased safety awareness;Decreased range of motion;Decreased coordination;Decreased activity tolerance;Decreased cognition;Decreased balance;Decreased knowledge of use of DME       PT Treatment Interventions DME instruction;Therapeutic activities;Cognitive remediation;Gait training;Therapeutic exercise;Patient/family education;Stair training;Balance training;Functional mobility training    PT Goals (Current goals can be found in the Care Plan section)  Acute Rehab PT Goals Patient Stated Goal: open to going to rehab PT Goal Formulation: With patient/family Time For Goal Achievement: 07/25/19 Potential to Achieve Goals: Good    Frequency Min 2X/week   Barriers to discharge Decreased caregiver support wife unable to physically assist    Co-evaluation               AM-PAC PT "6 Clicks" Mobility  Outcome Measure Help needed turning from your back to your side while in a flat bed without using bedrails?: A Lot Help needed moving from lying on your back to sitting on the  side of a flat bed without using bedrails?: A Lot Help needed moving to and from a bed to a chair (including a wheelchair)?: A Little Help needed standing up from a chair using your arms (e.g., wheelchair or bedside chair)?: A Little Help needed to walk in hospital room?: A Lot Help needed climbing 3-5 steps with a railing? : Total 6 Click Score: 13    End of Session Equipment Utilized During Treatment: Gait belt Activity Tolerance: Patient tolerated treatment well Patient left: in chair;with chair alarm set;with family/visitor present;with call bell/phone within reach Nurse Communication: Mobility status PT Visit Diagnosis: Unsteadiness on feet (R26.81);Muscle weakness (generalized) (M62.81)    Time: 5916-3846 PT Time Calculation (min) (ACUTE ONLY): 35 min   Charges:   PT Evaluation $PT Eval Moderate Complexity: 1 Mod          Maggie Font, PT Acute Rehab Services Pager [phone] [facility] [phone] [facility] [phone]   Jeremiah [name] 07/11/2019, 12:51 PM

## 2019-07-11 NOTE — Progress Notes (Signed)
  Speech Language Pathology Treatment: Dysphagia  Patient Details Name: Jeremiah [name] MRN: [id number] DOB: [birth year] Today's Date: 07/11/2019 Time: 1350-1420 SLP Time Calculation (min) (ACUTE ONLY): 30 min  Assessment / Plan / Recommendation Clinical Impression  SLP called to pt room, due to son's concerns that dys3 diet was too advanced for pt. Pt strength and endurance continue to be quite poor, and while pt appears to tolerate solids, they are too taxing for him at this time. Will downgrade diet to Dys2 (finely chopped), and continue thin liquids. Rec meds crushed in puree. Diet changed in computer, and information updated in pt room. SLP provided education to pt's son regarding change in diet and recommendation for staff to feed pt for energy conservation. Son has questions about plan of care (PT recommends SNF) and establishing POA. Recommend Palliative Care consult to facilitate establishment of appropriate goals of care for this pt. SLP will continue to follow to assess diet tolerance and provide ongoing education. RN aware of changes.     HPI HPI: 79 y.o. male, medical history including but not limited to hypertension, Dm2, prostate cancer s/p XRT, OSA on CPAP, NPH s/p VP shunt, presenting with altered mental status associated with fever, tachycardia, hypoxia and tachypnea after being found on the floor. Per chart son reports patient at baseline is quite "sharp". Per MD note suspicous pt may "have had another aspiration event."  had episode last night of unresponsiveness with hypoxia. Noted to be diaphoretic, foaming at his mouth-briefly placed on NRB mask and then repositioned-was quickly able to taper O2 down to 4 L returning sats to 100%.  CXR Progressive left basilar consolidation with likely small left pleural effusion. This could be related to atelectasis given previous CT findings. 2. Chronic elevation right hemidiaphragm with compressive atelectasis right base.      SLP Plan  Continue with current plan of care  Palliative Care consult recommended       Recommendations  Diet recommendations: Dysphagia 2 (fine chop) Liquids provided via: Straw;Cup Supervision: Full supervision/cueing for compensatory strategies;Staff to assist with self feeding Compensations: Minimize environmental distractions;Slow rate;Small sips/bites Postural Changes and/or Swallow Maneuvers: Seated upright 90 degrees                Oral Care Recommendations: Oral care BID Follow up Recommendations: Skilled Nursing facility SLP Visit Diagnosis: Dysphagia, unspecified (R13.10) Plan: Continue with current plan of care;Consult other service (comment)(Palliative Care consult recommended.)       GO              Jeremiah [name], [facility], [place] Speech Language Pathologist Office: [phone] Pager: [phone]  Shonna Chock 07/11/2019, 2:30 PM

## 2019-07-11 NOTE — Progress Notes (Signed)
[name]  PROGRESS NOTE    Jeremiah [name]  XLK:[id number] DOB: 05-06-1941 DOA: 07/04/2019 PCP: [name], MD   Brief Narrative:   79 y.o.male,medical history including but not limited to hypertension, Dm2, prostate cancer s/p XRT, OSA on CPAP, NPH s/p VP shunt, presenting with altered mental status associated with fever, tachycardia, hypoxia and tachypnea after being found on the floor.According to son, patient at baseline is quite "sharp". He does have history of NPH but not very ataxic usually. Apparently patient's daughter and wife went out and when returned home, they found patient on the floor not knowing how he got there. He appeared to have vomited. He was apparently doing well and at baseline before they went out. No report of family noting fever or chills or diarrhea or cough preceding the episode. Patient has video monitor in the room but son concerned that he is not able to be redirected.  ED course:Labs with AKI,creatinine 1.51 and hypokalemia .CT C-spine and head negative admission. Chest x-ray noted acute infiltrate.CT angiogram 07/05/2019-negative PE-bilateral lower lobe atelectasis versus infiltrate.  Hospital course: Patient admitted to [facility] for acute hypoxic respiratory failure and sepsis due to presumed pneumonia with IV antibiotics cefepime with Zithromax-MRSA PCR negative and therefore vancomycin discontinued.Hospital course complicated by ongoing delirium as well as episode of hypoxia with O2 sats in the 70s and unresponsiveness on the night of 2/15 when RRT was summoned.  07/11/19: Alert this morning and interactive. Joking with me and staff. Improvement over last couple of days. Needs K+ replaced. Diet advanced. SLP rec's PC consult. Have consulted. PT recs SNF. D/w son. TOC consulted. Continue pushing activity as able.    Assessment & Plan:   Principal Problem:   Acute respiratory failure with hypoxia (HCC) Active Problems:   HTN (hypertension)   Pneumonia  Tachycardia   AMS (altered mental status)  Acute hypoxic respiratory failure Sepsis syndrome ?Aspiration - Patient febrile, tachycardic with leukocytosis on presentation. - CT angiogram 07/05/2019:negative PE;bilateral lower lobe atelectasis versus infiltrate reported. - On Unasyn for suspected asp PNA - Bld Cx NTD, UCX neg. - now on 1L  and sats well - 07/10/19: abx complete today     - 07/11/19: SLP advancing diet. On and off 1L RA and sats are ok. Let's get him IS today. WBC ok and a febrile ON. Completed abx.     Acute encephalopathy Delirium - Patient found on floor - CT C-spine and head negative admission.  - Patient does have a history of NPH and is s/p VP shunt-not sure if patient had a mechanical fall and now confused due to head trauma/concussion.  - MRI head pending; will avoid Ativan/any benzodiazepine for pre MRI sedation as it might make his delirium worse. - Neurology onboard, appreciate assistance. - He did undergo EEG which was suggestive of generalized encephalopathy with no focal seizures. - Continue delirium/aspiration precautions.  - continue melatonin/B12 supplements/aspirin that he takes at home.      - 07/10/19: d/c quetiapine; let's encourage activity, get him up OOB, consult PT     - spoke with neurology; no MRI needed at this point     - 07/11/19: SLP rec's PC consult, have consulted for goals of care; he is more alert today (name, place) and joking. Push activity as able  Acute kidney injury - Due to dehydration versus sepsis with admitting creatinine 1.51 - resolved     - 07/10/19: d/c fluids     - 07/11/19: stable, monitor  Hypokalemia  -  On HCTZ and potassium 10 mEq at home. - increase K+ to 42mEq and also add IV run of 68mEq     - 07/10/19: K+ is ok today; Mg2+ ok     - 07/11/19: K+ is down again today, will increase to BID dosing, monitor  Hypertension - HCTZ held on  admission.  - SBP up at 140-170 - normalized with addition of low-dose Norvasc  Prolonged QTC  - Previous EKG from December 2020 showed prolonged QT interval at 520 ms.  - Repeat EKG shows significant improvement with normal QT C at 440 ms.  - Monitor periodically while utilizing psych meds/Seroquel.  - Replace electrolytes to keep potassium close to 4 and magnesium to 2.  History of diabetes mellitus - not on any medications at home.  - Hemoglobin A1c at 6.0. - Glucose monitoring with sliding scale insulin as needed     - 07/11/19: glucose is acceptable  GERD:  - continue protonix   Generalized weakness Debility     - PT rec's SNF. Have d/w son. TOC consulted for assistance in this  DVT prophylaxis: lovenox Code Status: FULL Family Communication: Spoke with son by phone   Disposition Plan: Still not at baseline mentation and current eval is for SNF. Still not at baseline O2 use.   Consultants:   Neurology  ROS:  Denies CP, N, V, ab pain. Remainder 10-pt ROS is negative for all not previously mentioned.  Subjective: " 'Jeremiah [name]' is my dad."  Objective: Vitals:   07/10/19 2116 07/11/19 0021 07/11/19 0406 07/11/19 0800  BP:  117/66 139/85 (!) 145/93  Pulse:  64 74 81  Resp: 18 (!) 21 (!) 23 (!) 21  Temp:  98.4 F (36.9 C) 99.4 F (37.4 C) (!) 97.4 F (36.3 C)  TempSrc:  Oral Oral Oral  SpO2:   93% 91%  Weight:      Height:        Intake/Output Summary (Last 24 hours) at 07/11/2019 1610 Last data filed at 07/11/2019 0600 Gross per 24 hour  Intake -  Output 325 ml  Net -325 ml   Filed Weights   07/05/19 0019 07/05/19 1209  Weight: 80 kg 80 kg    Examination:  General: 79 y.o. male resting in bed in NAD Cardiovascular: RRR, +S1, S2, no m/g/r Respiratory: CTABL, no w/r/r, normal WOB GI: BS+, NDNT, soft MSK: No e/c/c Neuro: alert to name, place; follows commands Psyc: Appropriate interaction and affect,  calm/cooperative   Data Reviewed: I have personally reviewed following labs and imaging studies.  CBC: Recent Labs  Lab 07/04/19 2332 07/05/19 0700 07/07/19 0255 07/08/19 0914 07/09/19 0241 07/10/19 0246 07/11/19 0841  WBC 13.0*   < > 11.6* 7.2 7.0 6.8 6.4  NEUTROABS 12.1*  --   --   --   --  4.9 4.9  HGB 16.5   < > 14.6 14.2 14.8 15.2 15.2  HCT 51.1   < > 44.5 42.4 44.7 46.0 45.5  MCV 96.6   < > 94.3 93.0 93.3 94.1 93.6  PLT 175   < > 159 183 184 197 196   < > = values in this interval not displayed.   Basic Metabolic Panel: Recent Labs  Lab 07/07/19 0255 07/08/19 0914 07/08/19 1450 07/09/19 0241 07/10/19 0246 07/11/19 0841  NA 142 142  --  139 139 139  K 3.8 3.2*  --  3.2* 3.7 3.3*  CL 105 103  --  98 101 101  CO2 28  28  --  28 29 27   GLUCOSE 111* 123*  --  119* 122* 117*  BUN 17 22  --  20 24* 19  CREATININE 1.14 1.17  --  1.03 1.14 1.22  CALCIUM 8.9 8.8*  --  8.7* 8.8* 8.8*  MG  --   --  1.9 1.8 1.9  --    GFR: Estimated Creatinine Clearance: 49.1 mL/min (by C-G formula based on SCr of 1.22 mg/dL). Liver Function Tests: Recent Labs  Lab 07/05/19 0700 07/06/19 0248 07/07/19 0255 07/10/19 0246 07/11/19 0841  AST 25 57* 63* 30 24  ALT 11 12 15 14 14   ALKPHOS 40 36* 41 39 36*  BILITOT 0.7 1.4* 1.7* 0.9 0.9  PROT 5.6* 5.4* 6.2* 5.8* 5.9*  ALBUMIN 3.0* 2.9* 3.0* 2.8* 3.0*   No results for input(s): LIPASE, AMYLASE in the last 168 hours. Recent Labs  Lab 07/05/19 0700  AMMONIA 26   Coagulation Profile: Recent Labs  Lab 07/04/19 2332  INR 1.0   Cardiac Enzymes: Recent Labs  Lab 07/04/19 2332  CKTOTAL 44*   BNP (last 3 results) No results for input(s): PROBNP in the last 8760 hours. HbA1C: No results for input(s): HGBA1C in the last 72 hours. CBG: Recent Labs  Lab 07/07/19 2216 07/08/19 2226  GLUCAP 102* 107*   Lipid Profile: No results for input(s): CHOL, HDL, LDLCALC, TRIG, CHOLHDL, LDLDIRECT in the last 72 hours. Thyroid Function  Tests: No results for input(s): TSH, T4TOTAL, FREET4, T3FREE, THYROIDAB in the last 72 hours. Anemia Panel: No results for input(s): VITAMINB12, FOLATE, FERRITIN, TIBC, IRON, RETICCTPCT in the last 72 hours. Sepsis Labs: Recent Labs  Lab 07/04/19 2332 07/05/19 0215 07/05/19 0700  LATICACIDVEN 2.6* 4.8* 3.7*    Recent Results (from the past 240 hour(s))  Culture, blood (Routine x 2)     Status: None   Collection Time: 07/04/19 11:34 PM   Specimen: BLOOD  Result Value Ref Range Status   Specimen Description BLOOD LEFT ANTECUBITAL  Final   Special Requests   Final    BOTTLES DRAWN AEROBIC AND ANAEROBIC Blood Culture results may not be optimal due to an inadequate volume of blood received in culture bottles   Culture   Final    NO GROWTH 5 DAYS Performed at [facility] [address] [place], [place] [address]    Report Status 07/10/2019 FINAL  Final  Culture, blood (Routine x 2)     Status: None   Collection Time: 07/04/19 11:34 PM   Specimen: BLOOD  Result Value Ref Range Status   Specimen Description BLOOD RIGHT ANTECUBITAL  Final   Special Requests   Final    BOTTLES DRAWN AEROBIC AND ANAEROBIC Blood Culture results may not be optimal due to an excessive volume of blood received in culture bottles   Culture   Final    NO GROWTH 5 DAYS Performed at [facility], [place] [address], [place], [place] [address]    Report Status 07/10/2019 FINAL  Final  Urine culture     Status: None   Collection Time: 07/04/19 11:44 PM   Specimen: Urine, Catheterized  Result Value Ref Range Status   Specimen Description URINE, CATHETERIZED  Final   Special Requests NONE  Final   Culture   Final    NO GROWTH Performed at [facility] [address] [place], [place] [address]    Report Status 07/06/2019 FINAL  Final  Respiratory Panel by RT PCR (Flu  A&B, Covid) - Nasopharyngeal Swab     Status: None   Collection Time: 07/05/19 12:36 AM   Specimen: Nasopharyngeal Swab   Result Value Ref Range Status   SARS Coronavirus 2 by RT PCR NEGATIVE NEGATIVE Final    Comment: (NOTE) SARS-CoV-2 target nucleic acids are NOT DETECTED. The SARS-CoV-2 RNA is generally detectable in upper respiratoy specimens during the acute phase of infection. The lowest concentration of SARS-CoV-2 viral copies this assay can detect is 131 copies/mL. A negative result does not preclude SARS-Cov-2 infection and should not be used as the sole basis for treatment or other patient management decisions. A negative result may occur with  improper specimen collection/handling, submission of specimen other than nasopharyngeal swab, presence of viral mutation(s) within the areas targeted by this assay, and inadequate number of viral copies (<131 copies/mL). A negative result must be combined with clinical observations, patient history, and epidemiological information. The expected result is Negative. Fact Sheet for Patients:  PinkCheek.be Fact Sheet for Healthcare Providers:  GravelBags.it This test is not yet ap proved or cleared by the [place] FDA and  has been authorized for detection and/or diagnosis of SARS-CoV-2 by FDA under an Emergency Use Authorization (EUA). This EUA will remain  in effect (meaning this test can be used) for the duration of the COVID-19 declaration under Section 564(b)(1) of the Act, 21 U.S.C. section 360bbb-3(b)(1), unless the authorization is terminated or revoked sooner.    Influenza A by PCR NEGATIVE NEGATIVE Final   Influenza B by PCR NEGATIVE NEGATIVE Final    Comment: (NOTE) The Xpert Xpress SARS-CoV-2/FLU/RSV assay is intended as an aid in  the diagnosis of influenza from Nasopharyngeal swab specimens and  should not be used as a sole basis for treatment. Nasal washings and  aspirates are unacceptable for Xpert Xpress SARS-CoV-2/FLU/RSV  testing. Fact Sheet for Patients:  PinkCheek.be Fact Sheet for Healthcare Providers: GravelBags.it This test is not yet approved or cleared by the [place] FDA and  has been authorized for detection and/or diagnosis of SARS-CoV-2 by  FDA under an Emergency Use Authorization (EUA). This EUA will remain  in effect (meaning this test can be used) for the duration of the  Covid-19 declaration under Section 564(b)(1) of the Act, 21  U.S.C. section 360bbb-3(b)(1), unless the authorization is  terminated or revoked. Performed at [facility], [place] [address] [place], [place] [address]   MRSA PCR Screening     Status: None   Collection Time: 07/05/19  6:11 AM   Specimen: Nasal Mucosa; Nasopharyngeal  Result Value Ref Range Status   MRSA by PCR NEGATIVE NEGATIVE Final    Comment:        The GeneXpert MRSA Assay (FDA approved for NASAL specimens only), is one component of a comprehensive MRSA colonization surveillance program. It is not intended to diagnose MRSA infection nor to guide or monitor treatment for MRSA infections. Performed at [facility], [place] [address] [place], [place] [address]       Radiology Studies: No results found.   Scheduled Meds: . amLODipine  2.5 mg Oral Daily  . aspirin EC  81 mg Oral Daily  . Chlorhexidine Gluconate Cloth  6 each Topical Daily  . enoxaparin (LOVENOX) injection  40 mg Subcutaneous Q24H  . feeding supplement (ENSURE ENLIVE)  237 mL Oral BID BM  . feeding supplement (PRO-STAT SUGAR FREE 64)  30 mL Oral Daily  . influenza vaccine adjuvanted  0.5 mL Intramuscular Tomorrow-1000  .  multivitamin with minerals  1 tablet Oral Daily  . pantoprazole  20 mg Oral QODAY  . potassium chloride SA  40 mEq Oral Daily  . senna-docusate  1 tablet Oral Daily  . vitamin B-12  1,000 mcg Oral Daily   Continuous Infusions: . ampicillin-sulbactam (UNASYN) IV 1.5 g (07/11/19 1514)     LOS: 6 days    Time spent:  25 minutes spent in the coordination of care today.    Jonnie Finner, DO [facility]  If 7PM-7AM, please contact night-coverage [email or web address] 07/11/2019, 4:10 PM

## 2019-07-12 ENCOUNTER — Inpatient Hospital Stay (HOSPITAL_COMMUNITY): Payer: Medicare Other

## 2019-07-12 DIAGNOSIS — Z7189 Other specified counseling: Secondary | ICD-10-CM

## 2019-07-12 DIAGNOSIS — Z66 Do not resuscitate: Secondary | ICD-10-CM

## 2019-07-12 DIAGNOSIS — M6281 Muscle weakness (generalized): Secondary | ICD-10-CM

## 2019-07-12 DIAGNOSIS — K117 Disturbances of salivary secretion: Secondary | ICD-10-CM

## 2019-07-12 DIAGNOSIS — Z515 Encounter for palliative care: Secondary | ICD-10-CM

## 2019-07-12 LAB — CBC WITH DIFFERENTIAL/PLATELET
Abs Immature Granulocytes: 0.09 10*3/uL — ABNORMAL HIGH (ref 0.00–0.07)
Basophils Absolute: 0.1 10*3/uL (ref 0.0–0.1)
Basophils Relative: 1 %
Eosinophils Absolute: 0.2 10*3/uL (ref 0.0–0.5)
Eosinophils Relative: 3 %
HCT: 43.3 % (ref 39.0–52.0)
Hemoglobin: 14.3 g/dL (ref 13.0–17.0)
Immature Granulocytes: 1 %
Lymphocytes Relative: 16 %
Lymphs Abs: 1.1 10*3/uL (ref 0.7–4.0)
MCH: 31 pg (ref 26.0–34.0)
MCHC: 33 g/dL (ref 30.0–36.0)
MCV: 93.7 fL (ref 80.0–100.0)
Monocytes Absolute: 0.6 10*3/uL (ref 0.1–1.0)
Monocytes Relative: 10 %
Neutro Abs: 4.4 10*3/uL (ref 1.7–7.7)
Neutrophils Relative %: 69 %
Platelets: 198 10*3/uL (ref 150–400)
RBC: 4.62 MIL/uL (ref 4.22–5.81)
RDW: 12.5 % (ref 11.5–15.5)
WBC: 6.4 10*3/uL (ref 4.0–10.5)
nRBC: 0 % (ref 0.0–0.2)

## 2019-07-12 LAB — COMPREHENSIVE METABOLIC PANEL
ALT: 12 U/L (ref 0–44)
AST: 21 U/L (ref 15–41)
Albumin: 2.9 g/dL — ABNORMAL LOW (ref 3.5–5.0)
Alkaline Phosphatase: 39 U/L (ref 38–126)
Anion gap: 10 (ref 5–15)
BUN: 18 mg/dL (ref 8–23)
CO2: 29 mmol/L (ref 22–32)
Calcium: 8.6 mg/dL — ABNORMAL LOW (ref 8.9–10.3)
Chloride: 97 mmol/L — ABNORMAL LOW (ref 98–111)
Creatinine, Ser: 1.12 mg/dL (ref 0.61–1.24)
GFR calc Af Amer: >60 mL/min (ref >60)
GFR calc non Af Amer: >60 mL/min (ref >60)
Glucose, Bld: 109 mg/dL — ABNORMAL HIGH (ref 70–99)
Potassium: 3.7 mmol/L (ref 3.5–5.1)
Sodium: 136 mmol/L (ref 135–145)
Total Bilirubin: 0.7 mg/dL (ref 0.3–1.2)
Total Protein: 5.7 g/dL — ABNORMAL LOW (ref 6.5–8.1)

## 2019-07-12 LAB — GLUCOSE, CAPILLARY: Glucose-Capillary: 104 mg/dL — ABNORMAL HIGH (ref 70–99)

## 2019-07-12 LAB — MAGNESIUM: Magnesium: 1.9 mg/dL (ref 1.7–2.4)

## 2019-07-12 MED ORDER — AMLODIPINE BESYLATE 5 MG PO TABS
5.0000 mg | ORAL_TABLET | Freq: Every day | ORAL | Status: DC
  Administered 2019-07-13 – 2019-07-15 (×3): 5 mg via ORAL
  Filled 2019-07-12 (×3): qty 1

## 2019-07-12 NOTE — Progress Notes (Addendum)
Jeremiah [name]  PROGRESS NOTE    Jeremiah [name]  PNT:[id number] DOB: 1940-07-03 DOA: 07/04/2019 PCP: [name], MD   Brief Narrative:   79 y.o.male,medical history including but not limited to hypertension, Dm2, prostate cancer s/p XRT, OSA on CPAP, NPH s/p VP shunt, presenting with altered mental status associated with fever, tachycardia, hypoxia and tachypnea after being found on the floor.According to son, patient at baseline is quite "sharp". He does have history of NPH but not very ataxic usually. Apparently patient's daughter and wife went out and when returned home, they found patient on the floor not knowing how he got there. He appeared to have vomited. He was apparently doing well and at baseline before they went out. No report of family noting fever or chills or diarrhea or cough preceding the episode. Patient has video monitor in the room but son concerned that he is not able to be redirected.  ED course:Labs with AKI,creatinine 1.51 and hypokalemia .CT C-spine and head negative admission. Chest x-ray noted acute infiltrate.CT angiogram 07/05/2019-negative PE-bilateral lower lobe atelectasis versus infiltrate.  Hospital course: Patient admitted to [facility] for acute hypoxic respiratory failure and sepsis due to presumed pneumonia with IV antibiotics cefepime with Zithromax-MRSA PCR negative and therefore vancomycin discontinued.Hospital course complicated by ongoing delirium as well as episode of hypoxia with O2 sats in the 70s and unresponsiveness on the night of 2/15 when RRT was summoned.  07/12/19:Up and alert with me this morning (still person, place only; but appropriately interacting). Tells me that he's cold but says his night was ok. He's having a time coming off and staying O2. Will repeat CXR today. K+ looks good today. Family has spoken with PC. Pt now DNR. Pt still rec'ing SNF. TOC consulted. SLP trying to work with him today, but he is drowsy per their report.    Assessment &  Plan:   Principal Problem:   Acute respiratory failure with hypoxia (HCC) Active Problems:   HTN (hypertension)   Pneumonia   Tachycardia   AMS (altered mental status)   Palliative care by specialist   Goals of care, counseling/discussion   DNR (do not resuscitate)   Muscular weakness   Xerostomia  Acute hypoxic respiratory failure Sepsis syndrome ?Aspiration - Patient febrile, tachycardic with leukocytosis on presentation. - CT angiogram 07/05/2019:negative PE;bilateral lower lobe atelectasis versus infiltrate reported. - On Unasyn for suspected asp PNA - Bld Cx NTD, UCX neg. - now on 1L [place] and sats well -07/10/19: abx complete today     - 07/11/19: SLP advancing diet. On and off 1L RA and sats are ok. Let's get him IS today. WBC ok and a febrile ON. Completed abx.     - 07/12/19: Still not completely off O2. Repeat CXR. SLP onboard, appreciate assistance. Let's see if we can get him working with IS. He has intermittent fevers; question if this is related to ongoing dysphagia. Check CXR, bld Cx. He is on Day 7 of Unasyn (d/c's today).  Acute encephalopathy Delirium - Patient found on floor - CT C-spine and head negative admission.  - Patient does have a history of NPH and is s/p VP shunt-not sure if patient had a mechanical fall and now confused due to head trauma/concussion.  - MRI head pending; will avoid Ativan/any benzodiazepine for pre MRI sedation as it might make his delirium worse. - Neurology onboard, appreciate assistance. - He did undergo EEG which was suggestive of generalized encephalopathy with no focal seizures. - Continue delirium/aspiration precautions.  -  continue melatonin/B12 supplements/aspirin that he takes at home. - 07/10/19: d/c quetiapine; let's encourage activity, get him up OOB, consult PT - spoke with neurology; no MRI needed at this point     - 07/11/19: SLP rec's PC consult, have  consulted for goals of care; he is more alert today (name, place) and joking. Push activity as able     - 07/12/19: Mentation is not completely back to baseline; this could be a case of slow resolution. The only thing that has not been checked was the MRI brain. Spoke with neurosurgery about MRI follow up. They will be able to reset the VP shunt within 24hr of MRI. Let's check MR to complete w/u.  Acute kidney injury - Due to dehydration versus sepsis with admitting creatinine 1.51 - resolved - 07/10/19: d/c fluids     - 07/11/19: stable, monitor     - 07/12/19: resolved  Hypokalemia  - On HCTZ and potassium 10 mEq at home. - increase K+ to 5mEq and also add IV run of 46mEq - 07/10/19: K+ is ok today; Mg2+ ok     - 07/11/19: K+ is down again today, will increase to BID dosing, monitor     - 07/12/19: K+ looks better today, will continue current dosing for now  Hypertension - HCTZ held on admission.  - SBP up at 140-170 - normalized with addition of low-dose Norvasc     - 07/12/19: BP consistently elevated; will increase norvasc  Prolonged QTC  - Previous EKG from December 2020 showed prolonged QT interval at 520 ms.  - Repeat EKG shows significant improvement with normal QT C at 440 ms.  - Monitor periodically while utilizing psych meds/Seroquel.  - Replace electrolytes to keep potassium close to 4 and magnesium to 2.  History of diabetes mellitus - not on any medications at home.  - Hemoglobin A1c at 6.0. - Glucose monitoring with sliding scale insulin as needed     - 07/12/19: glucose is acceptable  GERD:  - continue protonix  Generalized weakness Debility     - PT rec's SNF. Have d/w son. TOC consulted for assistance in this  DVT prophylaxis: lovenox Code Status: DNR Disposition Plan: Still not at baseline mentation. Needs SNF. Still not off O2.  Consultants:   Neurology  Palliative Care   ROS:  Denies ab pain, CP, N, V. Reports being cold. Remainder 10-pt ROS is negative for all not previously mentioned.  Subjective: "It's just a little chilly."  Objective: Vitals:   07/12/19 0050 07/12/19 0358 07/12/19 0749 07/12/19 1125  BP:  139/81 (!) 155/85 (!) 143/78  Pulse: 80 73 71 85  Resp: 20 20 (!) 21 (!) 25  Temp:  98.2 F (36.8 C) 98.4 F (36.9 C) 100.2 F (37.9 C)  TempSrc:   Oral Oral  SpO2: 90% 96% 98% 97%  Weight:      Height:        Intake/Output Summary (Last 24 hours) at 07/12/2019 1409 Last data filed at 07/12/2019 1146 Gross per 24 hour  Intake 240 ml  Output 1925 ml  Net -1685 ml   Filed Weights   07/05/19 0019 07/05/19 1209  Weight: 80 kg 80 kg    Examination:  General: 79 y.o. male resting in bed in NAD Cardiovascular: RRR, +S1, S2, no m/g/r, equal pulses throughout Respiratory: CTABL, no w/r/r, good air movement GI: BS+, NDNT, no masses noted MSK: No e/c/c Neuro: Alert to name/place only, no focal deficits Psyc: Appropriate interaction and  affect, calm/cooperative   Data Reviewed: I have personally reviewed following labs and imaging studies.  CBC: Recent Labs  Lab 07/08/19 0914 07/09/19 0241 07/10/19 0246 07/11/19 0841 07/12/19 0539  WBC 7.2 7.0 6.8 6.4 6.4  NEUTROABS  --   --  4.9 4.9 4.4  HGB 14.2 14.8 15.2 15.2 14.3  HCT 42.4 44.7 46.0 45.5 43.3  MCV 93.0 93.3 94.1 93.6 93.7  PLT 183 184 197 196 536   Basic Metabolic Panel: Recent Labs  Lab 07/08/19 0914 07/08/19 1450 07/09/19 0241 07/10/19 0246 07/11/19 0841 07/12/19 0539  NA 142  --  139 139 139 136  K 3.2*  --  3.2* 3.7 3.3* 3.7  CL 103  --  98 101 101 97*  CO2 28  --  28 29 27 29   GLUCOSE 123*  --  119* 122* 117* 109*  BUN 22  --  20 24* 19 18  CREATININE 1.17  --  1.03 1.14 1.22 1.12  CALCIUM 8.8*  --  8.7* 8.8* 8.8* 8.6*  MG  --  1.9 1.8 1.9  --  1.9   GFR: Estimated Creatinine Clearance: 53.5 mL/min (by C-G formula based on SCr of 1.12 mg/dL). Liver  Function Tests: Recent Labs  Lab 07/06/19 0248 07/07/19 0255 07/10/19 0246 07/11/19 0841 07/12/19 0539  AST 57* 63* 30 24 21   ALT 12 15 14 14 12   ALKPHOS 36* 41 39 36* 39  BILITOT 1.4* 1.7* 0.9 0.9 0.7  PROT 5.4* 6.2* 5.8* 5.9* 5.7*  ALBUMIN 2.9* 3.0* 2.8* 3.0* 2.9*   No results for input(s): LIPASE, AMYLASE in the last 168 hours. No results for input(s): AMMONIA in the last 168 hours. Coagulation Profile: No results for input(s): INR, PROTIME in the last 168 hours. Cardiac Enzymes: No results for input(s): CKTOTAL, CKMB, CKMBINDEX, TROPONINI in the last 168 hours. BNP (last 3 results) No results for input(s): PROBNP in the last 8760 hours. HbA1C: No results for input(s): HGBA1C in the last 72 hours. CBG: Recent Labs  Lab 07/07/19 2216 07/08/19 2226 07/11/19 2330 07/12/19 0356  GLUCAP 102* 107* 106* 104*   Lipid Profile: No results for input(s): CHOL, HDL, LDLCALC, TRIG, CHOLHDL, LDLDIRECT in the last 72 hours. Thyroid Function Tests: No results for input(s): TSH, T4TOTAL, FREET4, T3FREE, THYROIDAB in the last 72 hours. Anemia Panel: No results for input(s): VITAMINB12, FOLATE, FERRITIN, TIBC, IRON, RETICCTPCT in the last 72 hours. Sepsis Labs: No results for input(s): PROCALCITON, LATICACIDVEN in the last 168 hours.  Recent Results (from the past 240 hour(s))  Culture, blood (Routine x 2)     Status: None   Collection Time: 07/04/19 11:34 PM   Specimen: BLOOD  Result Value Ref Range Status   Specimen Description BLOOD LEFT ANTECUBITAL  Final   Special Requests   Final    BOTTLES DRAWN AEROBIC AND ANAEROBIC Blood Culture results may not be optimal due to an inadequate volume of blood received in culture bottles   Culture   Final    NO GROWTH 5 DAYS Performed at [facility], [place] [address], [place],  [address]    Report Status 07/10/2019 FINAL  Final  Culture, blood (Routine x 2)     Status: None   Collection Time: 07/04/19 11:34 PM   Specimen:  BLOOD  Result Value Ref Range Status   Specimen Description BLOOD RIGHT ANTECUBITAL  Final   Special Requests   Final    BOTTLES DRAWN AEROBIC AND ANAEROBIC Blood Culture results may  not be optimal due to an excessive volume of blood received in culture bottles   Culture   Final    NO GROWTH 5 DAYS Performed at [facility], [place] [address], [place], [place] [address]    Report Status 07/10/2019 FINAL  Final  Urine culture     Status: None   Collection Time: 07/04/19 11:44 PM   Specimen: Urine, Catheterized  Result Value Ref Range Status   Specimen Description URINE, CATHETERIZED  Final   Special Requests NONE  Final   Culture   Final    NO GROWTH Performed at [facility] [address], [place], [place] [address]    Report Status 07/06/2019 FINAL  Final  Respiratory Panel by RT PCR (Flu A&B, Covid) - Nasopharyngeal Swab     Status: None   Collection Time: 07/05/19 12:36 AM   Specimen: Nasopharyngeal Swab  Result Value Ref Range Status   SARS Coronavirus 2 by RT PCR NEGATIVE NEGATIVE Final    Comment: (NOTE) SARS-CoV-2 target nucleic acids are NOT DETECTED. The SARS-CoV-2 RNA is generally detectable in upper respiratoy specimens during the acute phase of infection. The lowest concentration of SARS-CoV-2 viral copies this assay can detect is 131 copies/mL. A negative result does not preclude SARS-Cov-2 infection and should not be used as the sole basis for treatment or other patient management decisions. A negative result may occur with  improper specimen collection/handling, submission of specimen other than nasopharyngeal swab, presence of viral mutation(s) within the areas targeted by this assay, and inadequate number of viral copies (<131 copies/mL). A negative result must be combined with clinical observations, patient history, and epidemiological information. The expected result is Negative. Fact Sheet for Patients:  PinkCheek.be  Fact Sheet for Healthcare Providers:  GravelBags.it This test is not yet ap proved or cleared by the [place] FDA and  has been authorized for detection and/or diagnosis of SARS-CoV-2 by FDA under an Emergency Use Authorization (EUA). This EUA will remain  in effect (meaning this test can be used) for the duration of the COVID-19 declaration under Section 564(b)(1) of the Act, 21 U.S.C. section 360bbb-3(b)(1), unless the authorization is terminated or revoked sooner.    Influenza A by PCR NEGATIVE NEGATIVE Final   Influenza B by PCR NEGATIVE NEGATIVE Final    Comment: (NOTE) The Xpert Xpress SARS-CoV-2/FLU/RSV assay is intended as an aid in  the diagnosis of influenza from Nasopharyngeal swab specimens and  should not be used as a sole basis for treatment. Nasal washings and  aspirates are unacceptable for Xpert Xpress SARS-CoV-2/FLU/RSV  testing. Fact Sheet for Patients: PinkCheek.be Fact Sheet for Healthcare Providers: GravelBags.it This test is not yet approved or cleared by the [place] FDA and  has been authorized for detection and/or diagnosis of SARS-CoV-2 by  FDA under an Emergency Use Authorization (EUA). This EUA will remain  in effect (meaning this test can be used) for the duration of the  Covid-19 declaration under Section 564(b)(1) of the Act, 21  U.S.C. section 360bbb-3(b)(1), unless the authorization is  terminated or revoked. Performed at [facility], [place] [address] [place],  [address]   MRSA PCR Screening     Status: None   Collection Time: 07/05/19  6:11 AM   Specimen: Nasal Mucosa; Nasopharyngeal  Result Value Ref Range Status   MRSA by PCR NEGATIVE NEGATIVE Final    Comment:        The GeneXpert MRSA Assay (FDA approved for  NASAL specimens only), is one component of a comprehensive MRSA colonization surveillance program. It is not intended to  diagnose MRSA infection nor to guide or monitor treatment for MRSA infections. Performed at [facility], [place] [address], [place], [place] [address]       Radiology Studies: No results found.   Scheduled Meds: . amLODipine  2.5 mg Oral Daily  . aspirin EC  81 mg Oral Daily  . Chlorhexidine Gluconate Cloth  6 each Topical Daily  . enoxaparin (LOVENOX) injection  40 mg Subcutaneous Q24H  . feeding supplement (ENSURE ENLIVE)  237 mL Oral BID BM  . feeding supplement (PRO-STAT SUGAR FREE 64)  30 mL Oral Daily  . influenza vaccine adjuvanted  0.5 mL Intramuscular Tomorrow-1000  . multivitamin with minerals  1 tablet Oral Daily  . pantoprazole  20 mg Oral QODAY  . potassium chloride SA  40 mEq Oral BID  . senna-docusate  1 tablet Oral Daily  . vitamin B-12  1,000 mcg Oral Daily   Continuous Infusions: . ampicillin-sulbactam (UNASYN) IV 1.5 g (07/12/19 1257)     LOS: 7 days    Time spent: 25 minutes spent in the coordination of care today.    Jonnie Finner, DO [facility]  If 7PM-7AM, please contact night-coverage [email or web address] 07/12/2019, 2:09 PM

## 2019-07-12 NOTE — TOC Initial Note (Signed)
Transition of Care [facility]) - Initial/Assessment Note    Patient Details  Name: Jeremiah [name] MRN: [id number] Date of Birth: 1941-04-27  Transition of Care [facility]) CM/SW Contact:    Arvella Merles, LCSW Phone Number: 07/12/2019, 2:20 PM  Clinical Narrative:                 CSW received consult for possible SNF placement at time of discharge. CSW spoke with patient's daughter [name] [phone] regarding PT recommendation of SNF placement at time of discharge. Patient's daughter expressed understanding of PT recommendation and is agreeable to SNF placement at time of discharge. Patient's daughter stated patient's wife has dementia and patient could not return home before receiving rehab. Patient's daughter reports preference for a SNF in the [employer] area, near herself and patient's son. She stated [facility] would be first choice but wherever is best for patient.  Patient's daughter inquired about CIR, CSW informed her patient would have to be assessed to determine if that would be an option for patient. CSW discussed insurance authorization process and provided Medicare SNF ratings list. No further questions reported at this time. CSW to continue to follow and assist with discharge planning needs.  Expected Discharge Plan: Skilled Nursing Facility Barriers to Discharge: Continued Medical Work up   Patient Goals and CMS Choice   CMS Medicare.gov Compare Post Acute Care list provided to:: Patient Represenative (must comment)(Stephanie) Choice offered to / list presented to : Adult Children  Expected Discharge Plan and Services Expected Discharge Plan: [place] arrangements for the past 2 months: Single Family Home                                      Prior Living Arrangements/Services Living arrangements for the past 2 months: Single Family Home Lives with:: Spouse Patient language and need for interpreter reviewed::  Yes Do you feel safe going back to the place where you live?: Yes      Need for Family Participation in Patient Care: Yes (Comment) Care giver support system in place?: Yes (comment)   Criminal Activity/Legal Involvement Pertinent to Current Situation/Hospitalization: No - Comment as needed  Activities of Daily Living Home Assistive Devices/Equipment: None ADL Screening (condition at time of admission) Patient's cognitive ability adequate to safely complete daily activities?: Yes Is the patient deaf or have difficulty hearing?: No Does the patient have difficulty seeing, even when wearing glasses/contacts?: No Does the patient have difficulty concentrating, remembering, or making decisions?: No Patient able to express need for assistance with ADLs?: Yes Does the patient have difficulty dressing or bathing?: No Independently performs ADLs?: Yes (appropriate for developmental age)(prior to illness) Does the patient have difficulty walking or climbing stairs?: No Weakness of Legs: None Weakness of Arms/Hands: None  Permission Sought/Granted Permission sought to share information with : Facility Sport and exercise psychologist, Family Supports    Share Information with NAME: Shelva Majestic  Permission granted to share info w AGENCY: SNFs  Permission granted to share info w Relationship: Adult Children  Permission granted to share info w Contact Information: [phone]  Emotional Assessment   Attitude/Demeanor/Rapport: Unable to Assess Affect (typically observed): Unable to Assess Orientation: : Oriented to Self Alcohol / Substance Use: Not Applicable Psych Involvement: No (comment)  Admission diagnosis:  Dehydration [E86.0] Hypokalemia [E87.6] Pneumonia [J18.9] AKI (acute kidney injury) ([place]) [N17.9] Community acquired pneumonia  of left lower lobe of lung [J18.9] Patient Active Problem List   Diagnosis Date Noted  . Palliative care by specialist   . Goals of care,  counseling/discussion   . DNR (do not resuscitate)   . Muscular weakness   . Xerostomia   . Pneumonia 07/05/2019  . Acute respiratory failure with hypoxia ([place]) 07/05/2019  . Tachycardia 07/05/2019  . AMS (altered mental status) 07/05/2019  . Other fatigue   . 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 Small bowel obstruction ([place]) 06/10/2016  . HLD (hyperlipidemia) 06/10/2016  . HTN (hypertension) 06/10/2016  . Acute kidney injury ([place]) 06/10/2016  . Dehydration, moderate 06/10/2016  . AAA (abdominal aortic aneurysm) ([place]) 06/10/2016  . OSA on CPAP 06/10/2016  . Malignant neoplasm of prostate s/p radiation 2015 03/10/2014   PCP:  [name], MD Pharmacy:   [facility], [address] [place] [address] Phone: [phone] Fax: [phone]     Social Determinants of Health (SDOH) Interventions    Readmission Risk Interventions No flowsheet data found.

## 2019-07-12 NOTE — Progress Notes (Signed)
SLP Cancellation Note  Patient Details Name: Jeremiah [name] MRN: [id number] DOB: [birth year]   Cancelled treatment:    Followed-up for swallowing.  [name] quite sleepy; not sufficiently alert to eat/drink.  Will continue efforts.  [name] L. Tivis Ringer, [place] Office number [phone] Pager [phone]        [name] 07/12/2019, 11:57 AM

## 2019-07-12 NOTE — NC FL2 (Signed)
[place] LEVEL OF CARE SCREENING TOOL     IDENTIFICATION  Patient Name: Jeremiah [name] Birthdate: 07/13/1940 Sex: male Admission Date (Current Location): 07/04/2019  [facility] and [place] Number:  Herbalist and Address:  [facility], [place] [address] [place], [place] [address]      Provider Number: [id number]  Attending Physician Name and Address:  Jonnie Finner, DO  Relative Name and Phone Number:  [name] [phone]    Current Level of Care: Hospital Recommended Level of Care: [place] Prior Approval Number:    Date Approved/Denied:   PASRR Number: [id number]  Discharge Plan: SNF    Current Diagnoses: Patient Active Problem List   Diagnosis Date Noted  . Palliative care by specialist   . Goals of care, counseling/discussion   . DNR (do not resuscitate)   . Muscular weakness   . Xerostomia   . Pneumonia 07/05/2019  . Acute respiratory failure with hypoxia ([place]) 07/05/2019  . Tachycardia 07/05/2019  . AMS (altered mental status) 07/05/2019  . Other fatigue   . 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 Small bowel obstruction ([place]) 06/10/2016  . HLD (hyperlipidemia) 06/10/2016  . HTN (hypertension) 06/10/2016  . Acute kidney injury ([place]) 06/10/2016  . Dehydration, moderate 06/10/2016  . AAA (abdominal aortic aneurysm) ([place]) 06/10/2016  . OSA on CPAP 06/10/2016  . Malignant neoplasm of prostate s/p radiation 2015 03/10/2014    Orientation RESPIRATION BLADDER Height & Weight     Self  O2(Nasal Cannula) Incontinent(External catheter) Weight: 176 lb 5.9 oz (80 kg) Height:  5\' 9"  (175.3 cm)  BEHAVIORAL SYMPTOMS/MOOD NEUROLOGICAL BOWEL NUTRITION STATUS      Continent Diet(see d/c summary)  AMBULATORY STATUS COMMUNICATION OF NEEDS Skin   Extensive Assist Verbally Normal                       Personal Care Assistance Level of Assistance  Bathing, Feeding, Dressing Bathing Assistance:  Maximum assistance Feeding assistance: Independent Dressing Assistance: Maximum assistance     Functional Limitations Info             SPECIAL CARE FACTORS FREQUENCY  PT (By licensed PT), OT (By licensed OT)     PT Frequency: 5x a week OT Frequency: 5x a week            Contractures Contractures Info: Not present    Additional Factors Info  Code Status, Allergies Code Status Info: DNR Allergies Info: NKA           Current Medications (07/12/2019):  This is the current hospital active medication list Current Facility-Administered Medications  Medication Dose Route Frequency Provider Last Rate Last Admin  . acetaminophen (TYLENOL) tablet 650 mg  650 mg Oral Q6H PRN Benito Mccreedy, MD   650 mg at 07/12/19 1143  . [START ON 07/13/2019] amLODipine (NORVASC) tablet 5 mg  5 mg Oral Daily Kyle, Tyrone A, DO      . ampicillin-sulbactam (UNASYN) 1.5 g in sodium chloride 0.9 % 100 mL IVPB  1.5 g Intravenous Q6H Kamineni, Neelima, MD 200 mL/hr at 07/12/19 1257 1.5 g at 07/12/19 1257  . aspirin EC tablet 81 mg  81 mg Oral Daily Jani Gravel, MD   81 mg at 07/12/19 0850  . Chlorhexidine Gluconate Cloth 2 % PADS 6 each  6 each Topical Daily Benito Mccreedy, MD   6 each at 07/12/19 1301  . enoxaparin (LOVENOX) injection 40 mg  40  mg Subcutaneous Q24H Jani Gravel, MD   40 mg at 07/12/19 0850  . feeding supplement (ENSURE ENLIVE) (ENSURE ENLIVE) liquid 237 mL  237 mL Oral BID BM Guilford Shi, MD   237 mL at 07/12/19 1305  . feeding supplement (PRO-STAT SUGAR FREE 64) liquid 30 mL  30 mL Oral Daily Guilford Shi, MD   30 mL at 07/12/19 0848  . hydrALAZINE (APRESOLINE) tablet 50 mg  50 mg Oral Q8H PRN Benito Mccreedy, MD   50 mg at 07/07/19 0608  . influenza vaccine adjuvanted (FLUAD) injection 0.5 mL  0.5 mL Intramuscular Tomorrow-1000 Osei-Bonsu, George, MD      . lip balm (CARMEX) ointment   Topical PRN Marylyn Ishihara, Tyrone A, DO      . Melatonin TABS 9 mg  9 mg Oral QHS PRN  Guilford Shi, MD      . multivitamin with minerals tablet 1 tablet  1 tablet Oral Daily Guilford Shi, MD   1 tablet at 07/12/19 0850  . pantoprazole (PROTONIX) EC tablet 20 mg  20 mg Oral Oretha Milch, MD   20 mg at 07/12/19 0849  . potassium chloride SA (KLOR-CON) CR tablet 40 mEq  40 mEq Oral BID Marylyn Ishihara, Tyrone A, DO   40 mEq at 07/12/19 0849  . senna-docusate (Senokot-S) tablet 1 tablet  1 tablet Oral Daily Jani Gravel, MD   1 tablet at 07/12/19 [phone]  . vitamin B-12 (CYANOCOBALAMIN) tablet 1,000 mcg  1,000 mcg Oral Daily Osei-Bonsu, Iona Beard, MD   1,000 mcg at 07/12/19 8502     Discharge Medications: Please see discharge summary for a list of discharge medications.  Relevant Imaging Results:  Relevant Lab Results:   Additional Information SSN [id number]  Neysa Hotter Riddick, LCSW

## 2019-07-12 NOTE — Progress Notes (Signed)
Responded to Spiritual Consult.  Found [name] non-responsive and shaking under his covers.  Read some scripture at bedside and prayed Lord's Prayer.    De Burrs Chaplain Resident

## 2019-07-12 NOTE — Consult Note (Signed)
Consultation Note Date: 07/12/2019   Patient Name: Jeremiah [name]  DOB: 15-Apr-1941  MRN: [id number]  Age / Sex: 79 y.o., male  PCP: [name], MD Referring Physician: Jonnie Finner, DO  Reason for Consultation: Establishing goals of care  HPI/Patient Profile:  Per hospitalist note --> 37 y.o.male,medical history including but not limited to hypertension, DM2, prostate cancer s/p XRT, OSA on CPAP, NPH s/p VP shunt, presenting with altered mental status associated with fever, tachycardia, hypoxia and tachypnea after being found on the floor.According to son, patient at baseline is quite "sharp". He does have history of NPH but not very ataxic usually. Apparently patient's daughter and wife went out and when returned home, they found patient on the floor not knowing how he got there. He appeared to have vomited. He was apparently doing well and at baseline before they went out. No report of family noting fever or chills or diarrhea or cough preceding the episode. Patient has video monitor in the room but son concerned that he is not able to be redirected.  Palliative care was asked to be involved for goals of care.  Clinical Assessment and Goals of Care: I have reviewed medical records including EPIC notes, labs and imaging, received report from bedside RN, assessed the patient.    I met with Jeremiah [name] at bedside and called his daughter, Jeremiah [name] to further discuss diagnosis prognosis, GOC, EOL wishes, disposition and options.   I introduced Palliative Medicine as specialized medical care for people living with serious illness. It focuses on providing relief from the symptoms and stress of a serious illness. The goal is to improve quality of life for both the patient and the family.  I asked [name] to tell me a little about himself. He shared that he was born and raised in [place]. He has  been married to his wife, Hassan Rowan for 48 years. She has dementia and he is her primary caregiver. He is a retired Chemical engineer. He has two children, Jeremiah [name] who loves in [place] and [place] who lives in [place]. He considers himself a man of faith and practices the [employer] religion.   Prior to hospitalization Jeremiah [name] was able to do all bADLs and most iADLs for himself. He was still driving locally. Per Jeremiah [name] she noticed in going to his home that perhaps he had not been taking his pills as prescribed. She had found multiple pill days not taken. She shared that he may need more help upon discharge. I asked her if she felt him living alone with their mother was still realistic. Jeremiah [name] said that she will truly rely on the medical providers guidance to help her and her brother navigate that.   She shared that they had vaguely talked about the possibility of placement in the past and that they are familiar with [facility]. She shared that her parents would likely be comfortable there. I told her I would request that social work reach out to help with identifying if that is an appropriate  next step.  A detailed discussion was had today regarding advanced directives, patient had prior named his children Jeremiah [name] and Jeremiah [name] to make decisions on his behalf should he be incapacitated.  Jeremiah [name] shared that Jeremiah [name] has a living will whereby he would not wish for extraneous measures to save his life. I confirmed that this meant be would be DNR/DNI which Jeremiah [name] agreed with. Given that patient is intermittently delirious all conversational decisions were confirmed with his daughter.  Discussed with patient the importance of continued conversation with family and their  medical providers regarding overall plan of care and treatment options, ensuring decisions are within the context of the patients values and GOCs.  We will continue to follow and offer support as able.  Decision  Maker: Jeremiah [name] (son) [phone] Jeremiah [name] (daughter) [phone]  SUMMARY OF RECOMMENDATIONS   DNR/DNI  Requested family bring a copy of advanced directives to scan into Epic  TOC --> Outpatient Palliative Care follow up  TOC --> Social work follow up, likely need long term ALF for he and his wife, they are familiar with [place]:  DNR  Symptom Management:  Weakness, generalized:  - Physical Therapy  - Occupational Therapy Xerostomia:                 - Good oral care QShift                 - Encourage liquid intake Delirium:                 - Delirium precautions                 - Get up during the day                 - Encourage a familiar face to remain present throughout the day                 - Keep blinds open and lights on during daylight hours                 - Minimize the use of opioids/benzodiazepines Spiritual:                 - Chaplain consult Social:   -Long term planning may require that patient and his wife consider an ALF for safety purposes. Patients children would need to review their finances to verify they could afford it. Social work consult would be greatly appreciated for this purpose.   Palliative Prophylaxis:   Aspiration, Bowel Regimen, Delirium Protocol, Eye Care, Frequent Pain Assessment, Oral Care, Palliative Wound Care and Turn Reposition  Additional Recommendations (Limitations, Scope, Preferences):  Full Scope Treatment  Psycho-social/Spiritual:   Desire for further Chaplaincy support: Yes  Additional Recommendations: Caregiving  Support/Resources  Prognosis:   Unable to determine  Discharge Planning: [place] for rehab with Palliative care service follow-up    Primary Diagnoses: Present on Admission: . Pneumonia . Acute respiratory failure with hypoxia ([place]) . Tachycardia . AMS (altered mental status) . HTN (hypertension)  I  have reviewed the medical record, interviewed the patient and family, and examined the patient. The following aspects are pertinent.  Past Medical History:  Diagnosis Date  . BPH (benign prostatic hyperplasia)   . Central retinal artery occlusion   . Diabetes mellitus without complication (HCC)    diet controlled  . Diverticulosis   . History of kidney stones   .  Hyperlipidemia   . Hypertension   . OSA on CPAP    wears cpap  . Osteoarthritis   . 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 S/P radiation therapy 04/23/13 - 05/29/14   Prostate/seminal vesicles, external beam 4500 cGy in 25 sessions  . Seasonal allergies   . Skin cancer    scalp  . Wears glasses    Social History   Socioeconomic History  . Marital status: Married    Spouse name: Not on file  . Number of children: 2  . Years of education: Not on file  . Highest education level: Not on file  Occupational History  . Occupation: retired    Comment: all Naval architect  Tobacco Use  . Smoking status: Former Smoker    Types: Cigarettes    Quit date: 03/10/1961    Years since quitting: 58.3  . Smokeless tobacco: Never Used  Substance and Sexual Activity  . Alcohol use: Yes    Alcohol/week: 1.0 standard drinks    Types: 1 Glasses of wine per week    Comment: rare alcohol  . Drug use: No  . Sexual activity: Not on file  Other Topics Concern  . Not on file  Social History Narrative   Rare caffeine use    Social Determinants of Health   Financial Resource Strain:   . Difficulty of Paying Living Expenses: Not on file  Food Insecurity:   . Worried About Charity fundraiser in the Last Year: Not on file  . Ran Out of Food in the Last Year: Not on file  Transportation Needs:   . Lack of Transportation (Medical): Not on file  . Lack of Transportation (Non-Medical): Not on file  Physical Activity:   . Days of Exercise per Week: Not on file  . Minutes of Exercise per Session: Not on file   Stress:   . Feeling of Stress : Not on file  Social Connections:   . Frequency of Communication with Friends and Family: Not on file  . Frequency of Social Gatherings with Friends and Family: Not on file  . Attends Religious Services: Not on file  . Active Member of Clubs or Organizations: Not on file  . Attends Archivist Meetings: Not on file  . Marital Status: Not on file   Family History  Problem Relation Age of Onset  . Heart attack Father 78  . Cancer Father        prostate  . Diabetes Father   . Cancer Paternal Uncle        prostate  . Dementia Mother 68  . Lung cancer Brother    Scheduled Meds: . amLODipine  2.5 mg Oral Daily  . aspirin EC  81 mg Oral Daily  . Chlorhexidine Gluconate Cloth  6 each Topical Daily  . enoxaparin (LOVENOX) injection  40 mg Subcutaneous Q24H  . feeding supplement (ENSURE ENLIVE)  237 mL Oral BID BM  . feeding supplement (PRO-STAT SUGAR FREE 64)  30 mL Oral Daily  . influenza vaccine adjuvanted  0.5 mL Intramuscular Tomorrow-1000  . multivitamin with minerals  1 tablet Oral Daily  . pantoprazole  20 mg Oral QODAY  . potassium chloride SA  40 mEq Oral BID  . senna-docusate  1 tablet Oral Daily  . vitamin B-12  1,000 mcg Oral Daily   Continuous Infusions: . ampicillin-sulbactam (UNASYN) IV 1.5 g (07/12/19 0116)   PRN Meds:.acetaminophen, hydrALAZINE, lip balm, Melatonin Medications  Prior to Admission:  Prior to Admission medications   Medication Sig Start Date End Date Taking? Authorizing Provider  Apoaequorin (PREVAGEN EXTRA STRENGTH) 20 MG CAPS Take 20-40 mg by mouth daily.    Yes [provider]  hydrochlorothiazide (MICROZIDE) 12.5 MG capsule Take 12.5 mg by mouth daily.  07/14/15  Yes [provider]  Melatonin 10 MG CAPS Take 10 mg by mouth at bedtime as needed (for sleep).    Yes [provider]  Multiple Vitamin (MULTIVITAMIN WITH MINERALS) TABS tablet Take 1 tablet by mouth daily.   Yes  [provider]  Multiple Vitamins-Minerals (PRESERVISION AREDS 2 PO) Take 1 capsule by mouth 2 (two) times daily.    Yes [provider]  pantoprazole (PROTONIX) 20 MG tablet Take 20 mg by mouth daily before breakfast.  02/19/19  Yes [provider]  potassium chloride (K-DUR) 10 MEQ tablet Take 10 mEq by mouth 2 (two) times daily.  06/16/15  Yes [provider]  acetaminophen (TYLENOL) 500 MG tablet Take 1,000 mg by mouth at bedtime.     [provider]  aspirin EC 81 MG tablet Take 81 mg by mouth daily.     [provider]  senna-docusate (SENOKOT-S) 8.6-50 MG tablet Take 1 tablet by mouth daily.    [provider]  vitamin B-12 (CYANOCOBALAMIN) 250 MCG tablet Take 250 mcg by mouth daily.    [provider]   No Known Allergies Review of Systems  Constitutional: Positive for fatigue.  Neurological: Positive for weakness.   Physical Exam Vitals and nursing note reviewed.  HENT:     Head: Normocephalic.     Nose: Nose normal.     Mouth/Throat:     Mouth: Mucous membranes are dry.  Eyes:     Pupils: Pupils are equal, round, and reactive to light.  Cardiovascular:     Rate and Rhythm: Normal rate and regular rhythm.     Pulses: Normal pulses.  Pulmonary:     Effort: Pulmonary effort is normal.     Comments: 2PM [place] Abdominal:     General: Abdomen is flat.  Musculoskeletal:     Cervical back: Normal range of motion.  Skin:    Capillary Refill: Capillary refill takes less than 2 seconds.     Coloration: Skin is pale.  Neurological:     Mental Status: He is alert.     Comments: Oriented to person and city, was unable to tell me which hospital we were in  Psychiatric:     Comments: Intermittently tearful    Vital Signs: BP 139/81 (BP Location: Right Arm)   Pulse 73   Temp 98.2 F (36.8 C)   Resp 20   Ht 5' 9" (1.753 m)   Wt 80 kg   SpO2 96%   BMI 26.05 kg/m  Pain Scale: 0-10   Pain Score: 0-No  pain  SpO2: SpO2: 96 % O2 Device:SpO2: 96 % O2 Flow Rate: .O2 Flow Rate (L/min): 3 L/min  IO: Intake/output summary:   Intake/Output Summary (Last 24 hours) at 07/12/2019 0645 Last data filed at 07/11/2019 2251 Gross per 24 hour  Intake 240 ml  Output 1300 ml  Net -1060 ml   LBM: Last BM Date: 07/09/19 Baseline Weight: Weight: 80 kg Most recent weight: Weight: 80 kg     Palliative Assessment/Data:40%  Time In: 0750 Time Out: 0900 Time Total: 70 Greater than 50%  of this time was spent counseling and coordinating care related to  the above assessment and plan.  Signed by: Rosezella Rumpf, NP   Please contact Palliative Medicine Team phone at [phone] for questions and concerns.  For individual provider: See Shea Evans

## 2019-07-13 ENCOUNTER — Inpatient Hospital Stay (HOSPITAL_COMMUNITY): Payer: Medicare Other

## 2019-07-13 LAB — COMPREHENSIVE METABOLIC PANEL
ALT: UNDETERMINED U/L (ref 0–44)
ALT: 12 U/L (ref 0–44)
AST: UNDETERMINED U/L (ref 15–41)
AST: 20 U/L (ref 15–41)
Albumin: UNDETERMINED g/dL (ref 3.5–5.0)
Albumin: 3.1 g/dL — ABNORMAL LOW (ref 3.5–5.0)
Alkaline Phosphatase: UNDETERMINED U/L (ref 38–126)
Alkaline Phosphatase: 41 U/L (ref 38–126)
Anion gap: UNDETERMINED (ref 5–15)
Anion gap: 10 (ref 5–15)
BUN: UNDETERMINED mg/dL (ref 8–23)
BUN: 20 mg/dL (ref 8–23)
CO2: UNDETERMINED mmol/L (ref 22–32)
CO2: 27 mmol/L (ref 22–32)
Calcium: UNDETERMINED mg/dL (ref 8.9–10.3)
Calcium: 8.7 mg/dL — ABNORMAL LOW (ref 8.9–10.3)
Chloride: UNDETERMINED mmol/L (ref 98–111)
Chloride: 95 mmol/L — ABNORMAL LOW (ref 98–111)
Creatinine, Ser: UNDETERMINED mg/dL (ref 0.61–1.24)
Creatinine, Ser: 1.09 mg/dL (ref 0.61–1.24)
GFR calc Af Amer: UNDETERMINED mL/min (ref >60)
GFR calc Af Amer: >60 mL/min (ref >60)
GFR calc non Af Amer: UNDETERMINED mL/min (ref >60)
GFR calc non Af Amer: >60 mL/min (ref >60)
Glucose, Bld: UNDETERMINED mg/dL (ref 70–99)
Glucose, Bld: 112 mg/dL — ABNORMAL HIGH (ref 70–99)
Potassium: UNDETERMINED mmol/L (ref 3.5–5.1)
Potassium: 4.1 mmol/L (ref 3.5–5.1)
Sodium: UNDETERMINED mmol/L (ref 135–145)
Sodium: 132 mmol/L — ABNORMAL LOW (ref 135–145)
Total Bilirubin: UNDETERMINED mg/dL (ref 0.3–1.2)
Total Bilirubin: 0.8 mg/dL (ref 0.3–1.2)
Total Protein: UNDETERMINED g/dL (ref 6.5–8.1)
Total Protein: 6 g/dL — ABNORMAL LOW (ref 6.5–8.1)

## 2019-07-13 LAB — CBC WITH DIFFERENTIAL/PLATELET
Abs Immature Granulocytes: 0.11 10*3/uL — ABNORMAL HIGH (ref 0.00–0.07)
Basophils Absolute: 0 10*3/uL (ref 0.0–0.1)
Basophils Relative: 1 %
Eosinophils Absolute: 0.1 10*3/uL (ref 0.0–0.5)
Eosinophils Relative: 1 %
HCT: 47.3 % (ref 39.0–52.0)
Hemoglobin: 16 g/dL (ref 13.0–17.0)
Immature Granulocytes: 1 %
Lymphocytes Relative: 15 %
Lymphs Abs: 1.2 10*3/uL (ref 0.7–4.0)
MCH: 31.2 pg (ref 26.0–34.0)
MCHC: 33.8 g/dL (ref 30.0–36.0)
MCV: 92.2 fL (ref 80.0–100.0)
Monocytes Absolute: 1 10*3/uL (ref 0.1–1.0)
Monocytes Relative: 12 %
Neutro Abs: 5.6 10*3/uL (ref 1.7–7.7)
Neutrophils Relative %: 70 %
Platelets: 228 10*3/uL (ref 150–400)
RBC: 5.13 MIL/uL (ref 4.22–5.81)
RDW: 12.5 % (ref 11.5–15.5)
WBC: 8 10*3/uL (ref 4.0–10.5)
nRBC: 0 % (ref 0.0–0.2)

## 2019-07-13 LAB — MAGNESIUM: Magnesium: 1.8 mg/dL (ref 1.7–2.4)

## 2019-07-13 NOTE — Progress Notes (Signed)
Palliative Medicine Inpatient Follow Up Note   HPI: Per hospitalist note --> 79 y.o.male,medical history including but not limited to hypertension, DM2, prostate cancer s/p XRT, OSA on CPAP, NPH s/p VP shunt, presenting with altered mental status associated with fever, tachycardia, hypoxia and tachypnea after being found on the floor.According to son, patient at baseline is quite "sharp". He does have history of NPH but not very ataxic usually. Apparently patient's daughter and wife went out and when returned home, they found patient on the floor not knowing how he got there. He appeared to have vomited. He was apparently doing well and at baseline before they went out. No report of family noting fever or chills or diarrhea or cough preceding the episode. Patient has video monitor in the room but son concerned that he is not able to be redirected.  Palliative care was asked to be involved for goals of care.  Today's Discussion (07/13/2019): Chart reviewed. Evaluated Jeremiah [name] this morning. He said that he was feeling well overall. He denied, pain, dyspnea, and nausea. We discussed the importance of him getting out of bed and working with physical therapy to regain strength. He vocalized understanding.   Called Jeremiah [name] (daughter) to provide an update on how Jeremiah [name] was doing. She asked more questions about the MOST form. I was able to share with her that we truly recommend this on every patient it is not a indicators that dad is dying which was her fear. Jeremiah [name] vocalized hopefulness for CIR evaluation to see if Jeremiah [name] would qualify. I let her know that I would bring this to the attention of the therapy team.   Discussed with patient the importance of continued conversation with family and their  medical providers regarding overall plan of care and treatment options, ensuring decisions are within the context of the patients values and GOCs.  Questions and concerns addressed   Vital  Signs Vitals:   07/13/19 0348 07/13/19 0804  BP: (!) 147/87 (!) 146/86  Pulse: 81 86  Resp: (!) 23 16  Temp: 100.1 F (37.8 C) 100 F (37.8 C)  SpO2: 96% 91%    Intake/Output Summary (Last 24 hours) at 07/13/2019 0856 Last data filed at 07/13/2019 0514 Gross per 24 hour  Intake 100 ml  Output 1100 ml  Net -1000 ml   Last Weight  Most recent update: 07/05/2019 12:10 PM   Weight  80 kg (176 lb 5.9 oz)           Physical Exam Vitals and nursing note reviewed.  HENT:     Head: Normocephalic.     Nose: Nose normal.     Mouth/Throat: Appears to have bleeding around his lips    Mouth: Mucous membranes are dry.  Eyes:     Pupils: Pupils are equal, round, and reactive to light.  Cardiovascular:     Rate and Rhythm: Normal rate and regular rhythm.     Pulses: Normal pulses.  Pulmonary:     Effort: Pulmonary effort is normal.     Comments: 2PM [name] Abdominal:     General: Abdomen is flat.  Musculoskeletal:     Cervical back: Normal range of motion.  Skin:    Capillary Refill: Capillary refill takes less than 2 seconds.     Coloration: Skin is pale.  Neurological:     Mental Status: He is alert.     Comments: Oriented to person and city, was unable to tell me which hospital we were in  Psychiatric:  Comments: Intermittently tearful   SUMMARY OF RECOMMENDATIONS   DNR/DNI  Family is interested to know if patient is a CIR candidate if not they are amenable to SNF placement  Requested family bring a copy of advanced directives to scan into Epic  Plan for Outpatient Palliative Care follow up  Symptom Management:  Weakness, generalized:                 - Physical Therapy                 - Occupational Therapy Xerostomia: - Good oral care QShift - Encourage liquid intake Delirium: - Delirium precautions - Get up during the day - Encourage a familiar face to remain present throughout  the day - Keep blinds open and lights on during daylight hours - Minimize the use of opioids/benzodiazepines Spiritual: - Chaplain consult Social:                  -Appreciate SW looking into long term planning for patient and his wife  Time Spent: 25 Greater than 50% of the time was spent in counseling and coordination of care ______________________________________________________________________________________ [place] Team Team Cell Phone: [phone] Please utilize secure chat with additional questions, if there is no response within 30 minutes please call the above phone number  Palliative Medicine Team providers are available by phone from 7am to 7pm daily and can be reached through the team cell phone.  Should this patient require assistance outside of these hours, please call the patient's attending physician.

## 2019-07-13 NOTE — Evaluation (Signed)
Occupational Therapy Evaluation Patient Details Name: Jeremiah [name] MRN: [id number] DOB: 1940/07/03 Today's Date: 07/13/2019    History of Present Illness Pt is 79 y.o. male,  medical history including but not limited to hypertension, Dm2, prostate cancer s/p XRT, OSA on CPAP, NPH s/p VP shunt, presenting with altered mental status associated with fever, tachycardia, hypoxia and tachypnea.  Pt admitted with acute resp failure and sepsis syndrome. CT Chest neg for PE.  CT head and cspine: no acute changes, shunt stable, degenerative changes in Cspine.   Clinical Impression   Pt admitted with above. He demonstrates the below listed deficits and will benefit from continued OT to maximize safety and independence with BADLs. Per chart, pt lived with wife, who has dementia and to whom he was primary caregiver.  He was independent with ADLs and was still driving locally.  Today,  Pt  Was very lethargic this pm with very limited ability to participate with OT. He required total A for all attempts at self care activities, and only attempted to follow commands ~30% of the time.  He required total A for bed mobility and with attempts to reposition.  Eval was limited due to arrival of transportation tech.  At this time do not feel pt can participate in 3 hours of therapy/day as required in CIR. SNF level rehab is recommended at this time.      Follow Up Recommendations  SNF;Supervision/Assistance - 24 hour    Equipment Recommendations  Hospital bed    Recommendations for Other Services       Precautions / Restrictions Precautions Precautions: Fall Restrictions Weight Bearing Restrictions: No      Mobility Bed Mobility Overal bed mobility: Needs Assistance Bed Mobility: Rolling Rolling: Total assist   Supine to sit: Total assist     General bed mobility comments: Pt does not attempt to assist.  unable to move pt to EOB due arrival of transport tech   Transfers                  General transfer comment: unable to attempt safely     Balance Overall balance assessment: Needs assistance   Sitting balance-Leahy Scale: Poor   Postural control: Right lateral lean                                 ADL either performed or assessed with clinical judgement   ADL Overall ADL's : Needs assistance/impaired Eating/Feeding: Total assistance   Grooming: Wash/dry hands;Wash/dry face;Oral care;Brushing hair;Total assistance;Bed level   Upper Body Bathing: Total assistance;Bed level   Lower Body Bathing: Total assistance;Bed level   Upper Body Dressing : Total assistance;Bed level   Lower Body Dressing: Total assistance;Bed level   Toilet Transfer: Total assistance Toilet Transfer Details (indicate cue type and reason): pt unable to attempt  Toileting- Clothing Manipulation and Hygiene: Total assistance;Bed level       Functional mobility during ADLs: Total assistance General ADL Comments: pt unable to engage in ADL activity      Vision   Additional Comments: Pt leaning to Rt in bed with head and neck rotated to the Rt.  he will look partially to the Lt with his eyes, but makes no attempt to turn his head even with max cues and facilitation      Perception Perception Perception Tested?: Yes Comments: questionable Lt inattention as pt does not turn head to the left despite max cues,  vs. generalized fatigue and lethargy causing decreased responses    Praxis Praxis Praxis tested?: Not tested    Pertinent Vitals/Pain Pain Assessment: Faces Faces Pain Scale: No hurt     Hand Dominance Right   Extremity/Trunk Assessment Upper Extremity Assessment Upper Extremity Assessment: Generalized weakness;RUE deficits/detail;LUE deficits/detail RUE Deficits / Details: Pt will spontaneously bend Rt elbow.  PROM grossly WFL  RUE Coordination: decreased fine motor;decreased gross motor LUE Deficits / Details: no spontaneous movement noted.  PROM grossly WFL   LUE Coordination: decreased gross motor;decreased fine motor   Lower Extremity Assessment Lower Extremity Assessment: RLE deficits/detail;LLE deficits/detail RLE Deficits / Details: flexes Rt knee spontaneously.   LLE Deficits / Details: no spontaneous movement of Lt LE noted    Cervical / Trunk Assessment Cervical / Trunk Assessment: Kyphotic;Other exceptions Cervical / Trunk Exceptions: head/neck rotated to the Rt, and pt with Rt lateral lean    Communication Communication Communication: Expressive difficulties;Receptive difficulties   Cognition Arousal/Alertness: Lethargic Behavior During Therapy: Flat affect Overall Cognitive Status: Impaired/Different from baseline Area of Impairment: Attention;Following commands                   Current Attention Level: Focused   Following Commands: Follows one step commands inconsistently;Follows one step commands with increased time     Problem Solving: Slow processing;Decreased initiation General Comments: pt lethargic this date.  He will follow ~30* of one step commands with cues.  he demonstrates focused attention    General Comments  VSS     Exercises     Shoulder Instructions      Home Living Family/patient expects to be discharged to:: Private residence Living Arrangements: Spouse/significant other Available Help at Discharge: Family Type of Home: House Home Access: Stairs to enter Technical brewer of Steps: 2 Entrance Stairs-Rails: Right;Left Home Layout: Laundry or work area in basement;Two level;Able to live on main level with bedroom/bathroom     Bathroom Shower/Tub: Walk-in shower   Bathroom Toilet: Handicapped height     Home Equipment: Environmental consultant - 2 wheels;Cane - single point;Shower seat - built in   Additional Comments: possible w/c and bsc- not sure      Prior Functioning/Environment Level of Independence: Needs assistance  Gait / Transfers Assistance Needed: Reports community  ambulation ADL's / Homemaking Assistance Needed: reports independent with ADLS   Comments: info gleaned from PT eval as pt unable to provide info         OT Problem List: Decreased strength;Decreased activity tolerance;Decreased range of motion;Impaired balance (sitting and/or standing);Impaired vision/perception;Decreased coordination;Decreased cognition;Decreased knowledge of precautions;Decreased knowledge of use of DME or AE;Decreased safety awareness;Impaired UE functional use      OT Treatment/Interventions: Self-care/ADL training;Therapeutic exercise;Neuromuscular education;DME and/or AE instruction;Therapeutic activities;Cognitive remediation/compensation;Visual/perceptual remediation/compensation;Patient/family education;Balance training    OT Goals(Current goals can be found in the care plan section) Acute Rehab OT Goals OT Goal Formulation: Patient unable to participate in goal setting Time For Goal Achievement: 07/27/19 Potential to Achieve Goals: Fair ADL Goals Pt Will Perform Grooming: with mod assist;sitting Pt Will Perform Upper Body Bathing: with mod assist;sitting Additional ADL Goal #1: Pt will sustain attention to familiar ADL task x 3 mins with min cues Additional ADL Goal #2: Pt will maintain EOB sitting x 7 mins with mod A  OT Frequency: Min 2X/week   Barriers to D/C: Decreased caregiver support  does not have adequate assist at discharge        Co-evaluation  AM-PAC OT "6 Clicks" Daily Activity     Outcome Measure Help from another person eating meals?: Total Help from another person taking care of personal grooming?: Total Help from another person toileting, which includes using toliet, bedpan, or urinal?: Total Help from another person bathing (including washing, rinsing, drying)?: Total Help from another person to put on and taking off regular upper body clothing?: Total Help from another person to put on and taking off regular lower  body clothing?: Total 6 Click Score: 6   End of Session Equipment Utilized During Treatment: Oxygen Nurse Communication: Mobility status  Activity Tolerance: Patient limited by fatigue;Patient limited by lethargy Patient left: in bed;with call bell/phone within reach;Other (comment)(transport tech in room)  OT Visit Diagnosis: Cognitive communication deficit (Z74.715)                Time: 9539-6728 OT Time Calculation (min): 10 min Charges:  OT General Charges $OT Visit: 1 Visit OT Evaluation $OT Eval Moderate Complexity: 1 Mod  Nilsa Nutting., OTR/L Acute Rehabilitation Services Pager [phone] Office [phone]   Lucille Passy M 07/13/2019, 5:30 PM

## 2019-07-13 NOTE — Progress Notes (Signed)
MRI completed. Nurse notified prior to sending for pt that neurosurgery has to be contacted to after scan. Shunt to be reprogrammed within 24 hrs.

## 2019-07-13 NOTE — Progress Notes (Signed)
Neurosurgery on-call notified patients VPshunt and MRI performed this afternoon.  Dr Kathyrn Sheriff returned call and was notified of need to reset.

## 2019-07-13 NOTE — Progress Notes (Signed)
[name] Kitchen  PROGRESS NOTE    Jeremiah [name]  MEQ:[id number] DOB: 12-09-1940 DOA: 07/04/2019 PCP: [name], MD   Brief Narrative:   79 y.o.male,medical history including but not limited to hypertension, Dm2, prostate cancer s/p XRT, OSA on CPAP, NPH s/p VP shunt, presenting with altered mental status associated with fever, tachycardia, hypoxia and tachypnea after being found on the floor.According to son, patient at baseline is quite "sharp". He does have history of NPH but not very ataxic usually. Apparently patient's daughter and wife went out and when returned home, they found patient on the floor not knowing how he got there. He appeared to have vomited. He was apparently doing well and at baseline before they went out. No report of family noting fever or chills or diarrhea or cough preceding the episode. Patient has video monitor in the room but son concerned that he is not able to be redirected.  ED course:Labs with AKI,creatinine 1.51 and hypokalemia .CT C-spine and head negative admission. Chest x-ray noted acute infiltrate.CT angiogram 07/05/2019-negative PE-bilateral lower lobe atelectasis versus infiltrate.  Hospital course: Patient admitted to [facility] for acute hypoxic respiratory failure and sepsis due to presumed pneumonia with IV antibiotics cefepime with Zithromax-MRSA PCR negative and therefore vancomycin discontinued.Hospital course complicated by ongoing delirium as well as episode of hypoxia with O2 sats in the 70s and unresponsiveness on the night of 2/15 when RRT was summoned.  07/13/19:Easily arousable this AM. Interacting as he has been the last couple of days (A&O to person, place only). Denies complaints this AM. MRI brain re-ordered. CXR shows improvement. He's been on and off O2 support over the last couple of days. Let's check an ABG. Bld Cx pending for intermittent fevers. At this point if nothing shows on MRI, this may be a case of just a slow long recovery or new  baseline.     Assessment & Plan:   Principal Problem:   Acute respiratory failure with hypoxia (HCC) Active Problems:   HTN (hypertension)   Pneumonia   Tachycardia   AMS (altered mental status)   Palliative care by specialist   Goals of care, counseling/discussion   DNR (do not resuscitate)   Muscular weakness   Xerostomia  Acute hypoxic respiratory failure Sepsis syndrome ?Aspiration - Patient febrile, tachycardic with leukocytosis on presentation. - CT angiogram 07/05/2019:negative PE;bilateral lower lobe atelectasis versus infiltrate reported. - On Unasyn for suspected asp PNA - Bld Cx NTD, UCX neg. - now on 1L [place] and sats well -07/10/19: abx complete today - 07/11/19: SLP advancing diet. On and off 1L RA and sats are ok. Let's get him IS today. WBC ok and a febrile ON. Completed abx.     - 07/12/19: Still not completely off O2. Repeat CXR. SLP onboard, appreciate assistance. Let's see if we can get him working with IS. He has intermittent fevers; question if this is related to ongoing dysphagia. Check CXR, bld Cx. He is on Day 7 of Unasyn (d/c's today).     - 07/13/19: completed abx; fevers responsive to APAP, Bld Cx pending; on and off O2 support over last couple days  Acute encephalopathy Delirium - Patient found on floor - CT C-spine and head negative admission.  - Patient does have a history of NPH and is s/p VP shunt-not sure if patient had a mechanical fall and now confused due to head trauma/concussion.  - MRI head pending; will avoid Ativan/any benzodiazepine for pre MRI sedation as it might make his delirium worse. -  Neurology onboard, appreciate assistance. - He did undergo EEG which was suggestive of generalized encephalopathy with no focal seizures. - Continue delirium/aspiration precautions.  - continue melatonin/B12 supplements/aspirin that he takes at home. - 07/10/19: d/c quetiapine; let's  encourage activity, get him up OOB, consult PT - spoke with neurology; no MRI needed at this point - 07/11/19: SLP rec's PC consult, have consulted for goals of care; he is more alert today (name, place) and joking. Push activity as able     - 07/12/19: Mentation is not completely back to baseline; this could be a case of slow resolution. The only thing that has not been checked was the MRI brain. Spoke with neurosurgery about MRI follow up. They will be able to reset the VP shunt within 24hr of MRI. Let's check MR to complete w/u     - 07/13/19: remains A&O x 2, MRI pending. Check ABG. Otherwise, continue current Tx..  Acute kidney injury - Due to dehydration versus sepsis with admitting creatinine 1.51 - resolved - 07/10/19: d/c fluids - 07/11/19: stable, monitor     - 07/12/19: resolved  Hypokalemia  - On HCTZ and potassium 10 mEq at home. - increase K+ to 33mEq and also add IV run of 74mEq - 07/10/19: K+ is ok today; Mg2+ ok - 07/11/19: K+ is down again today, will increase to BID dosing, monitor     - 07/12/19: K+ looks better today, will continue current dosing for now     - 07/13/19: K+ is good today, monitor  Hypertension - HCTZ held on admission.  - SBP up at 140-170 - normalized with addition of low-dose Norvasc     - 07/12/19: BP consistently elevated; will increase norvasc  Prolonged QTC  - Previous EKG from December 2020 showed prolonged QT interval at 520 ms.  - Repeat EKG shows significant improvement with normal QT C at 440 ms.  - Monitor periodically while utilizing psych meds/Seroquel.  - Replace electrolytes to keep potassium close to 4 and magnesium to 2.  History of diabetes mellitus - not on any medications at home.  - Hemoglobin A1c at 6.0. - Glucose monitoring with sliding scale insulin as needed - 07/13/19: glucose is find, monitor  GERD:  - continue  protonix  Generalized weakness Debility - PT rec's SNF. Have d/w son. TOC consulted for assistance in this  DVT prophylaxis: lovenox Code Status: DNR Family Communication: Spoke with son by phone   Disposition Plan: Needs SNF; continuing to wean O2, MRI Brain pending.  Consultants:   Neurology  Palliative Care  ROS:  Denies CP, ab pain, N, V, dyspnea . Remainder 10-pt ROS is negative for all not previously mentioned.  Subjective: "I think last night was fine."  Objective: Vitals:   07/13/19 0001 07/13/19 0100 07/13/19 0348 07/13/19 0804  BP: (!) 146/87 (!) 146/87 (!) 147/87 (!) 146/86  Pulse: 80  81 86  Resp: 19  (!) 23 16  Temp: 97.8 F (36.6 C) 97.8 F (36.6 C) 100.1 F (37.8 C) 100 F (37.8 C)  TempSrc: Oral Oral Oral Oral  SpO2: 95%  96% 91%  Weight:      Height:        Intake/Output Summary (Last 24 hours) at 07/13/2019 1015 Last data filed at 07/13/2019 0900 Gross per 24 hour  Intake 340 ml  Output 1100 ml  Net -760 ml   Filed Weights   07/05/19 0019 07/05/19 1209  Weight: 80 kg 80 kg    Examination:  General: 79 y.o. male resting in bed in NAD Cardiovascular: RRR, +S1, S2, no m/g/r Respiratory: CTABL, no w/r/r, normal WOB GI: BS+, NDNT, soft MSK: No e/c/c Neuro: Alert to name, place only; no focal deficits Psyc: Appropriate interaction and affect, calm/cooperative   Data Reviewed: I have personally reviewed following labs and imaging studies.  CBC: Recent Labs  Lab 07/09/19 0241 07/10/19 0246 07/11/19 0841 07/12/19 0539 07/13/19 0230  WBC 7.0 6.8 6.4 6.4 8.0  NEUTROABS  --  4.9 4.9 4.4 5.6  HGB 14.8 15.2 15.2 14.3 16.0  HCT 44.7 46.0 45.5 43.3 47.3  MCV 93.3 94.1 93.6 93.7 92.2  PLT 184 197 196 198 161   Basic Metabolic Panel: Recent Labs  Lab 07/08/19 0914 07/08/19 1450 07/09/19 0241 07/09/19 0241 07/10/19 0246 07/11/19 0841 07/12/19 0539 07/13/19 0230 07/13/19 0622  NA   < >  --  139   < > 139 139 136 QUANTITY  NOT SUFFICIENT, UNABLE TO PERFORM TEST 132*  K   < >  --  3.2*   < > 3.7 3.3* 3.7 QUANTITY NOT SUFFICIENT, UNABLE TO PERFORM TEST 4.1  CL   < >  --  98   < > 101 101 97* QUANTITY NOT SUFFICIENT, UNABLE TO PERFORM TEST 95*  CO2   < >  --  28   < > 29 27 29  QUANTITY NOT SUFFICIENT, UNABLE TO PERFORM TEST 27  GLUCOSE   < >  --  119*   < > 122* 117* 109* QUANTITY NOT SUFFICIENT, UNABLE TO PERFORM TEST 112*  BUN   < >  --  20   < > 24* 19 18 QUANTITY NOT SUFFICIENT, UNABLE TO PERFORM TEST 20  CREATININE   < >  --  1.03   < > 1.14 1.22 1.12 QUANTITY NOT SUFFICIENT, UNABLE TO PERFORM TEST 1.09  CALCIUM   < >  --  8.7*   < > 8.8* 8.8* 8.6* QUANTITY NOT SUFFICIENT, UNABLE TO PERFORM TEST 8.7*  MG  --  1.9 1.8  --  1.9  --  1.9  --  1.8   < > = values in this interval not displayed.   GFR: Estimated Creatinine Clearance: 55 mL/min (by C-G formula based on SCr of 1.09 mg/dL). Liver Function Tests: Recent Labs  Lab 07/10/19 0246 07/11/19 0841 07/12/19 0539 07/13/19 0230 07/13/19 0622  AST 30 24 21  QUANTITY NOT SUFFICIENT, UNABLE TO PERFORM TEST 20  ALT 14 14 12  QUANTITY NOT SUFFICIENT, UNABLE TO PERFORM TEST 12  ALKPHOS 39 36* 39 QUANTITY NOT SUFFICIENT, UNABLE TO PERFORM TEST 41  BILITOT 0.9 0.9 0.7 QUANTITY NOT SUFFICIENT, UNABLE TO PERFORM TEST 0.8  PROT 5.8* 5.9* 5.7* QUANTITY NOT SUFFICIENT, UNABLE TO PERFORM TEST 6.0*  ALBUMIN 2.8* 3.0* 2.9* QUANTITY NOT SUFFICIENT, UNABLE TO PERFORM TEST 3.1*   No results for input(s): LIPASE, AMYLASE in the last 168 hours. No results for input(s): AMMONIA in the last 168 hours. Coagulation Profile: No results for input(s): INR, PROTIME in the last 168 hours. Cardiac Enzymes: No results for input(s): CKTOTAL, CKMB, CKMBINDEX, TROPONINI in the last 168 hours. BNP (last 3 results) No results for input(s): PROBNP in the last 8760 hours. HbA1C: No results for input(s): HGBA1C in the last 72 hours. CBG: Recent Labs  Lab 07/07/19 2216 07/08/19 2226  07/11/19 2330 07/12/19 0356  GLUCAP 102* 107* 106* 104*   Lipid Profile: No results for input(s): CHOL, HDL, LDLCALC, TRIG, CHOLHDL, LDLDIRECT in the last 72  hours. Thyroid Function Tests: No results for input(s): TSH, T4TOTAL, FREET4, T3FREE, THYROIDAB in the last 72 hours. Anemia Panel: No results for input(s): VITAMINB12, FOLATE, FERRITIN, TIBC, IRON, RETICCTPCT in the last 72 hours. Sepsis Labs: No results for input(s): PROCALCITON, LATICACIDVEN in the last 168 hours.  Recent Results (from the past 240 hour(s))  Culture, blood (Routine x 2)     Status: None   Collection Time: 07/04/19 11:34 PM   Specimen: BLOOD  Result Value Ref Range Status   Specimen Description BLOOD LEFT ANTECUBITAL  Final   Special Requests   Final    BOTTLES DRAWN AEROBIC AND ANAEROBIC Blood Culture results may not be optimal due to an inadequate volume of blood received in culture bottles   Culture   Final    NO GROWTH 5 DAYS Performed at [facility], [place] [address], [place], [place] [address]    Report Status 07/10/2019 FINAL  Final  Culture, blood (Routine x 2)     Status: None   Collection Time: 07/04/19 11:34 PM   Specimen: BLOOD  Result Value Ref Range Status   Specimen Description BLOOD RIGHT ANTECUBITAL  Final   Special Requests   Final    BOTTLES DRAWN AEROBIC AND ANAEROBIC Blood Culture results may not be optimal due to an excessive volume of blood received in culture bottles   Culture   Final    NO GROWTH 5 DAYS Performed at [facility], [place] [address] [place], [place] [address]    Report Status 07/10/2019 FINAL  Final  Urine culture     Status: None   Collection Time: 07/04/19 11:44 PM   Specimen: Urine, Catheterized  Result Value Ref Range Status   Specimen Description URINE, CATHETERIZED  Final   Special Requests NONE  Final   Culture   Final    NO GROWTH Performed at [place] [address] [place], [place] [address]    Report Status 07/06/2019 FINAL   Final  Respiratory Panel by RT PCR (Flu A&B, Covid) - Nasopharyngeal Swab     Status: None   Collection Time: 07/05/19 12:36 AM   Specimen: Nasopharyngeal Swab  Result Value Ref Range Status   SARS Coronavirus 2 by RT PCR NEGATIVE NEGATIVE Final    Comment: (NOTE) SARS-CoV-2 target nucleic acids are NOT DETECTED. The SARS-CoV-2 RNA is generally detectable in upper respiratoy specimens during the acute phase of infection. The lowest concentration of SARS-CoV-2 viral copies this assay can detect is 131 copies/mL. A negative result does not preclude SARS-Cov-2 infection and should not be used as the sole basis for treatment or other patient management decisions. A negative result may occur with  improper specimen collection/handling, submission of specimen other than nasopharyngeal swab, presence of viral mutation(s) within the areas targeted by this assay, and inadequate number of viral copies (<131 copies/mL). A negative result must be combined with clinical observations, patient history, and epidemiological information. The expected result is Negative. Fact Sheet for Patients:  PinkCheek.be Fact Sheet for Healthcare Providers:  GravelBags.it This test is not yet ap proved or cleared by the [place] FDA and  has been authorized for detection and/or diagnosis of SARS-CoV-2 by FDA under an Emergency Use Authorization (EUA). This EUA will remain  in effect (meaning this test can be used) for the duration of the COVID-19 declaration under Section 564(b)(1) of the Act, 21 U.S.C. section 360bbb-3(b)(1), unless the authorization is terminated or revoked sooner.    Influenza  A by PCR NEGATIVE NEGATIVE Final   Influenza B by PCR NEGATIVE NEGATIVE Final    Comment: (NOTE) The Xpert Xpress SARS-CoV-2/FLU/RSV assay is intended as an aid in  the diagnosis of influenza from Nasopharyngeal swab specimens and  should not be used as a  sole basis for treatment. Nasal washings and  aspirates are unacceptable for Xpert Xpress SARS-CoV-2/FLU/RSV  testing. Fact Sheet for Patients: PinkCheek.be Fact Sheet for Healthcare Providers: GravelBags.it This test is not yet approved or cleared by the [place] FDA and  has been authorized for detection and/or diagnosis of SARS-CoV-2 by  FDA under an Emergency Use Authorization (EUA). This EUA will remain  in effect (meaning this test can be used) for the duration of the  Covid-19 declaration under Section 564(b)(1) of the Act, 21  U.S.C. section 360bbb-3(b)(1), unless the authorization is  terminated or revoked. Performed at [facility], [place] [address], [place], [place] [address]   MRSA PCR Screening     Status: None   Collection Time: 07/05/19  6:11 AM   Specimen: Nasal Mucosa; Nasopharyngeal  Result Value Ref Range Status   MRSA by PCR NEGATIVE NEGATIVE Final    Comment:        The GeneXpert MRSA Assay (FDA approved for NASAL specimens only), is one component of a comprehensive MRSA colonization surveillance program. It is not intended to diagnose MRSA infection nor to guide or monitor treatment for MRSA infections. Performed at [facility], [place] [address] [place], [place] [address]       Radiology Studies: DG CHEST PORT 1 VIEW  Result Date: 07/12/2019 CLINICAL DATA:  Hypoxia. EXAM: PORTABLE CHEST 1 VIEW COMPARISON:  07/07/2019 FINDINGS: Visualized segment of patient's right-sided ventriculoperitoneal shunt is unchanged. Stable elevation of the right hemidiaphragm. Improved left base opacification likely improving effusion/atelectasis. Cardiomediastinal silhouette and remainder of the exam is unchanged. IMPRESSION: Interval improvement left base opacification likely improving effusion/atelectasis. Stable elevation right hemidiaphragm. Electronically Signed   By: Marin Olp M.D.   On:  07/12/2019 16:47     Scheduled Meds: . amLODipine  5 mg Oral Daily  . aspirin EC  81 mg Oral Daily  . Chlorhexidine Gluconate Cloth  6 each Topical Daily  . enoxaparin (LOVENOX) injection  40 mg Subcutaneous Q24H  . feeding supplement (ENSURE ENLIVE)  237 mL Oral BID BM  . feeding supplement (PRO-STAT SUGAR FREE 64)  30 mL Oral Daily  . influenza vaccine adjuvanted  0.5 mL Intramuscular Tomorrow-1000  . multivitamin with minerals  1 tablet Oral Daily  . pantoprazole  20 mg Oral QODAY  . potassium chloride SA  40 mEq Oral BID  . senna-docusate  1 tablet Oral Daily  . vitamin B-12  1,000 mcg Oral Daily   Continuous Infusions:   LOS: 8 days    Time spent: 25 minutes spent in the coordination of care today.    Jonnie Finner, DO [facility]  If 7PM-7AM, please contact night-coverage [email or web address] 07/13/2019, 10:15 AM

## 2019-07-14 LAB — BLOOD GAS, ARTERIAL
Acid-Base Excess: 5.1 mmol/L — ABNORMAL HIGH (ref 0.0–2.0)
Bicarbonate: 29.2 mmol/L — ABNORMAL HIGH (ref 20.0–28.0)
FIO2: 28
O2 Saturation: 93.8 %
Patient temperature: 37
pCO2 arterial: 44.1 mmHg (ref 32.0–48.0)
pH, Arterial: 7.437 (ref 7.350–7.450)
pO2, Arterial: 70.7 mmHg — ABNORMAL LOW (ref 83.0–108.0)

## 2019-07-14 LAB — SARS CORONAVIRUS 2 (TAT 6-24 HRS): SARS Coronavirus 2: NEGATIVE

## 2019-07-14 NOTE — Progress Notes (Signed)
Occupational Therapy Treatment Patient Details Name: Jeremiah [name] MRN: [id number] DOB: 1940/10/20 Today's Date: 07/14/2019    History of present illness Pt is a 79 y.o. male admitted 07/04/19 with AMS, fever, tachycardia after being found down by family. Worked up for acute respiratory failure and sepsis. CXR with acute infiltrate. Chest CT negative for PE. Head/cervical CT without acute abnormality, VP shunt stable. EEG suggestive of generalized encephalopathy with no focal seizures. PMH includes NPH s/p VP shunt, prostate CA, DM2, HTN, OSA on CPAP, OA.   OT comments  Pt continues to demonstrate significant weakness and flexes posture, requiring +2 assist for all mobility. He was able to perform toothbrushing with min assist, but appeared to perseverate. Total assist needed to comb hair. Pt minimally verbal, but agreeable to activities and reporting he wanted to watch the news. Remained up in chair at end of session.   Follow Up Recommendations  SNF;Supervision/Assistance - 24 hour    Equipment Recommendations  Hospital bed    Recommendations for Other Services      Precautions / Restrictions Precautions Precautions: Fall Restrictions Weight Bearing Restrictions: No       Mobility Bed Mobility Overal bed mobility: Needs Assistance Bed Mobility: Supine to Sit     Supine to sit: Mod assist;+2 for physical assistance;HOB elevated     General bed mobility comments: Will initiate movement for <5 sec, then stops, requiring frequent cues for sequencing, assist to reaching RUE over to bed rail; modA+2 to elevate trunk and scoot hips to EOB  Transfers Overall transfer level: Needs assistance Equipment used: 2 person hand held assist;Rolling walker (2 wheeled) Transfers: Sit to/from Stand Sit to Stand: Mod assist;+2 physical assistance         General transfer comment: Performed multiple sit<>stands from bed and recliner, practiced with and without RW, modA+2 to assist trunk  elevation and cue hip extension; very forward flexed, fatigued(?) posture with difficulty achieving fully upright; upright posture somewhat improved with use of RW    Balance Overall balance assessment: Needs assistance Sitting-balance support: Bilateral upper extremity supported;Feet supported Sitting balance-Leahy Scale: Poor Sitting balance - Comments: leaning L and posteriorly requiring min-maxA to maintain sitting balance at EOB despite verbal and visual cues to correct Postural control: Left lateral lean;Posterior lean Standing balance support: During functional activity;Bilateral upper extremity supported Standing balance-Leahy Scale: Poor                             ADL either performed or assessed with clinical judgement   ADL Overall ADL's : Needs assistance/impaired     Grooming: Brushing hair;Sitting;Total assistance;Oral care;Minimal assistance Grooming Details (indicate cue type and reason): Pt set up for oral care once in chair, appears to perseverate on tootbrushing. Hand over hand assist to comb hair after shampoo cap.                 Toilet Transfer: +2 for physical assistance;Moderate assistance;Stand-pivot Toilet Transfer Details (indicate cue type and reason): simulated to recliner           General ADL Comments: Pt with flexed posture in sitting and standing with inability to raise his head.     Vision       Perception     Praxis      Cognition Arousal/Alertness: Awake/alert;Lethargic Behavior During Therapy: Flat affect Overall Cognitive Status: No family/caregiver present to determine baseline cognitive functioning Area of Impairment: Attention;Following commands;Problem solving;Awareness  Orientation Level: Person Current Attention Level: Focused   Following Commands: Follows one step commands inconsistently;Follows one step commands with increased time   Awareness: Intellectual Problem Solving: Slow  processing;Decreased initiation;Requires verbal cues General Comments: Initially more conversant/answering questions, becoming less responsive to conversation by end of session (fatigue-related?)        Exercises     Shoulder Instructions       General Comments SpO2 95% on 3L O2, post-mobility BP 123/82    Pertinent Vitals/ Pain       Pain Assessment: Faces Faces Pain Scale: No hurt Pain Descriptors / Indicators: Tiring Pain Intervention(s): Monitored during session  Home Living                                          Prior Functioning/Environment              Frequency  Min 2X/week        Progress Toward Goals  OT Goals(current goals can now be found in the care plan section)  Progress towards OT goals: Progressing toward goals  Acute Rehab OT Goals Patient Stated Goal: open to going to rehab OT Goal Formulation: Patient unable to participate in goal setting Time For Goal Achievement: 07/27/19 Potential to Achieve Goals: [place] Discharge plan remains appropriate    Co-evaluation    PT/OT/SLP Co-Evaluation/Treatment: Yes Reason for Co-Treatment: For patient/therapist safety   OT goals addressed during session: ADL's and self-care      AM-PAC OT "6 Clicks" Daily Activity     Outcome Measure   Help from another person eating meals?: Total Help from another person taking care of personal grooming?: A Lot Help from another person toileting, which includes using toliet, bedpan, or urinal?: Total Help from another person bathing (including washing, rinsing, drying)?: Total Help from another person to put on and taking off regular upper body clothing?: Total Help from another person to put on and taking off regular lower body clothing?: Total 6 Click Score: 7    End of Session Equipment Utilized During Treatment: Gait belt;Oxygen  OT Visit Diagnosis: Unsteadiness on feet (R26.81);Other abnormalities of gait and mobility  (R26.89);Muscle weakness (generalized) (M62.81);Other symptoms and signs involving cognitive function   Activity Tolerance Patient limited by fatigue;Patient limited by lethargy   Patient Left in chair;with call bell/phone within reach   Nurse Communication Mobility status(aware oral care was completed)        Time: 2637-8588 OT Time Calculation (min): 31 min  Charges: OT General Charges $OT Visit: 1 Visit OT Treatments $Self Care/Home Management : 8-22 mins  Nestor Lewandowsky, OTR/L Acute Rehabilitation Services Pager: [phone] Office: [phone]   Malka So 07/14/2019, 12:15 PM

## 2019-07-14 NOTE — TOC Progression Note (Addendum)
Transition of Care [facility]) - Progression Note    Patient Details  Name: Jeremiah [name] MRN: [id number] Date of Birth: 08/26/1940  [facility] CM/SW Contact  Pollie Friar, RN Phone Number: 07/14/2019, 12:10 PM  Clinical Narrative:    CM provided the bed offers for SNF to the daughter. She will look them over and call CM with a choice. She also is going to get a number for a contact at [facility] for CM to call and send referral.  PT doesn't think he can tolerate CIR and daughter updated.  TOC following.  1530: Addendum: Civil engineer, contracting [employer]) offered a bed and daughter accepted. Insurance started through [place] for [employer]. Asked MD for covid test.    Expected Discharge Plan: [place] Barriers to Discharge: Continued Medical Work up  Expected Discharge Plan and Services Expected Discharge Plan: [place] arrangements for the past 2 months: Single Family Home                                       Social Determinants of Health (SDOH) Interventions    Readmission Risk Interventions No flowsheet data found.

## 2019-07-14 NOTE — Progress Notes (Signed)
[name] Kitchen  PROGRESS NOTE    Jeremiah [name]  GGE:[id number] DOB: 1941/05/13 DOA: 07/04/2019 PCP: [name], MD   Brief Narrative:   79 y.o.male,medical history including but not limited to hypertension, Dm2, prostate cancer s/p XRT, OSA on CPAP, NPH s/p VP shunt, presenting with altered mental status associated with fever, tachycardia, hypoxia and tachypnea after being found on the floor.According to son, patient at baseline is quite "sharp". He does have history of NPH but not very ataxic usually. Apparently patient's daughter and wife went out and when returned home, they found patient on the floor not knowing how he got there. He appeared to have vomited. He was apparently doing well and at baseline before they went out. No report of family noting fever or chills or diarrhea or cough preceding the episode. Patient has video monitor in the room but son concerned that he is not able to be redirected.  ED course:Labs with AKI,creatinine 1.51 and hypokalemia .CT C-spine and head negative admission. Chest x-ray noted acute infiltrate.CT angiogram 07/05/2019-negative PE-bilateral lower lobe atelectasis versus infiltrate.  Hospital course: Patient admitted to [facility] for acute hypoxic respiratory failure and sepsis due to presumed pneumonia with IV antibiotics cefepime with Zithromax-MRSA PCR negative and therefore vancomycin discontinued.Hospital course complicated by ongoing delirium as well as episode of hypoxia with O2 sats in the 70s and unresponsiveness on the night of 2/15 when RRT was summoned.  07/14/19:MRI w/o new deficit. Still A&O x 2 and has been at this level for last several days.  Looks like he needs O2 at night, but tends to be ok during the day. He is pretty stable at this point and it looks like this will be a long recovery as far as mentation is concerned. CM has a SNF bed offer. Will send COVID screen.   Assessment & Plan:   Principal Problem:   Acute respiratory failure with  hypoxia (HCC) Active Problems:   HTN (hypertension)   Pneumonia   Tachycardia   AMS (altered mental status)   Palliative care by specialist   Goals of care, counseling/discussion   DNR (do not resuscitate)   Muscular weakness   Xerostomia  Acute hypoxic respiratory failure Sepsis syndrome ?Aspiration - Patient febrile, tachycardic with leukocytosis on presentation. - CT angiogram 07/05/2019:negative PE;bilateral lower lobe atelectasis versus infiltrate reported. - On Unasyn for suspected asp PNA - Bld Cx NTD, UCX neg. - now on 1L [place] and sats well -07/10/19: abx complete today - 07/11/19: SLP advancing diet. On and off 1L RA and sats are ok. Let's get him IS today. WBC ok and a febrile ON. Completed abx. - 07/12/19: Still not completely off O2. Repeat CXR. SLP onboard, appreciate assistance.Let's see if we can get him working with IS. He has intermittent fevers; question if this is related to ongoing dysphagia. Check CXR, bld Cx. He is on Day 7 of Unasyn (d/c's today).     - 07/13/19: completed abx; fevers responsive to APAP, Bld Cx pending; on and off O2 support over last couple days     - 07/14/19: O2 at night? Seems to be fine during the day. CXR from 07/12/19 is good. Will check ABG, but ultimately may just need O2 at night. Afebrile ON  Acute encephalopathy Delirium - Patient found on floor - CT C-spine and head negative admission.  - Patient does have a history of NPH and is s/p VP shunt-not sure if patient had a mechanical fall and now confused due to head trauma/concussion.  -  MRI head pending; will avoid Ativan/any benzodiazepine for pre MRI sedation as it might make his delirium worse. - Neurology onboard, appreciate assistance. - He did undergo EEG which was suggestive of generalized encephalopathy with no focal seizures. - Continue delirium/aspiration precautions.  - continue melatonin/B12 supplements/aspirin  that he takes at home. - 07/10/19: d/c quetiapine; let's encourage activity, get him up OOB, consult PT - spoke with neurology; no MRI needed at this point - 07/11/19: SLP rec's PC consult, have consulted for goals of care; he is more alert today (name, place) and joking. Push activity as able - 07/12/19:Mentation is not completely back to baseline; this could be a case of slow resolution. The only thing that has not been checked was the MRI brain. Spoke with neurosurgery about MRI follow up. They will be able to reset the VP shunt within 24hr of MRI. Let's check MR to complete w/u     - 07/13/19: remains A&O x 2, MRI pending. Check ABG. Otherwise, continue current Tx..     - 07/14/19: MRI negative for acute change. ABG pending. May just be new baseline or long recovery  Acute kidney injury - Due to dehydration versus sepsis with admitting creatinine 1.51 - resolved - 07/10/19: d/c fluids - 07/11/19: stable, monitor - 07/12/19: resolved  Hypokalemia  - On HCTZ and potassium 10 mEq at home. - increase K+ to 26mEq and also add IV run of 68mEq - 07/10/19: K+ is ok today; Mg2+ ok - 07/11/19: K+ is down again today, will increase to BID dosing, monitor - 07/12/19: K+ looks better today, will continue current dosing for now     - 07/13/19: K+ is good today, monitor  Hypertension - HCTZ held on admission.  - SBP up at 140-170 - normalized with addition of low-dose Norvasc - 07/12/19: BP consistently elevated; will increase norvasc     - 07/14/19: BP ok, monitor  Prolonged QTC  - Previous EKG from December 2020 showed prolonged QT interval at 520 ms.  - Repeat EKG shows significant improvement with normal QT C at 440 ms.  - Monitor periodically while utilizing psych meds/Seroquel.  - Replace electrolytes to keep potassium close to 4 and magnesium to 2.  History of diabetes mellitus - not on any  medications at home.  - Hemoglobin A1c at 6.0. - Glucose monitoring with sliding scale insulin as needed - 07/13/19: glucose is find, monitor  GERD:  - continue protonix  Generalized weakness Debility - PT rec's SNF. Have d/w son. TOC consulted for assistance in this     - 07/14/19: has bed offer, COVID ordered, likely d/c tomorrow  OSA     - On CPAP at home per daughter; needs it nightly  DVT prophylaxis: lovenox Code Status: DNR Family Communication: Spoke with dtr by phone.   Disposition Plan: Needs SNF, continuing to wean O2.  Consultants:   Neurology  Palliative Care  ROS:  Denies dyspnea, N, V, HA, ab pain, incontinence, CP . Remainder 10-pt ROS is negative for all not previously mentioned.  Subjective: "I guess it's going ok."  Objective: Vitals:   07/14/19 0100 07/14/19 0400 07/14/19 0750 07/14/19 1140  BP: 128/73 120/73 123/82 96/71  Pulse:   87 92  Resp:   (!) 23 (!) 21  Temp: 97.8 F (36.6 C) 98.3 F (36.8 C) 98.4 F (36.9 C) 98.1 F (36.7 C)  TempSrc: Axillary Axillary Oral Oral  SpO2:   94% 95%  Weight:      Height:  Intake/Output Summary (Last 24 hours) at 07/14/2019 1453 Last data filed at 07/14/2019 1400 Gross per 24 hour  Intake 250 ml  Output 1000 ml  Net -750 ml   Filed Weights   07/05/19 0019 07/05/19 1209  Weight: 80 kg 80 kg    Examination:  General: 79 y.o. male resting in bed in NAD Cardiovascular: RRR, +S1, S2, no m/g/r, equal pulses throughout Respiratory: CTABL, no w/r/r, normal WOB GI: BS+, NDNT, no masses noted, no organomegaly noted MSK: No e/c/c Neuro: Alert to name and place only, follows commands Psyc:  Calm/cooperative, but slow in response   Data Reviewed: I have personally reviewed following labs and imaging studies.  CBC: Recent Labs  Lab 07/09/19 0241 07/10/19 0246 07/11/19 0841 07/12/19 0539 07/13/19 0230  WBC 7.0 6.8 6.4 6.4 8.0  NEUTROABS  --  4.9 4.9 4.4 5.6  HGB  14.8 15.2 15.2 14.3 16.0  HCT 44.7 46.0 45.5 43.3 47.3  MCV 93.3 94.1 93.6 93.7 92.2  PLT 184 197 196 198 884   Basic Metabolic Panel: Recent Labs  Lab 07/08/19 0914 07/08/19 1450 07/09/19 0241 07/09/19 0241 07/10/19 0246 07/11/19 0841 07/12/19 0539 07/13/19 0230 07/13/19 0622  NA   < >  --  139   < > 139 139 136 QUANTITY NOT SUFFICIENT, UNABLE TO PERFORM TEST 132*  K   < >  --  3.2*   < > 3.7 3.3* 3.7 QUANTITY NOT SUFFICIENT, UNABLE TO PERFORM TEST 4.1  CL   < >  --  98   < > 101 101 97* QUANTITY NOT SUFFICIENT, UNABLE TO PERFORM TEST 95*  CO2   < >  --  28   < > 29 27 29  QUANTITY NOT SUFFICIENT, UNABLE TO PERFORM TEST 27  GLUCOSE   < >  --  119*   < > 122* 117* 109* QUANTITY NOT SUFFICIENT, UNABLE TO PERFORM TEST 112*  BUN   < >  --  20   < > 24* 19 18 QUANTITY NOT SUFFICIENT, UNABLE TO PERFORM TEST 20  CREATININE   < >  --  1.03   < > 1.14 1.22 1.12 QUANTITY NOT SUFFICIENT, UNABLE TO PERFORM TEST 1.09  CALCIUM   < >  --  8.7*   < > 8.8* 8.8* 8.6* QUANTITY NOT SUFFICIENT, UNABLE TO PERFORM TEST 8.7*  MG  --  1.9 1.8  --  1.9  --  1.9  --  1.8   < > = values in this interval not displayed.   GFR: Estimated Creatinine Clearance: 55 mL/min (by C-G formula based on SCr of 1.09 mg/dL). Liver Function Tests: Recent Labs  Lab 07/10/19 0246 07/11/19 0841 07/12/19 0539 07/13/19 0230 07/13/19 0622  AST 30 24 21  QUANTITY NOT SUFFICIENT, UNABLE TO PERFORM TEST 20  ALT 14 14 12  QUANTITY NOT SUFFICIENT, UNABLE TO PERFORM TEST 12  ALKPHOS 39 36* 39 QUANTITY NOT SUFFICIENT, UNABLE TO PERFORM TEST 41  BILITOT 0.9 0.9 0.7 QUANTITY NOT SUFFICIENT, UNABLE TO PERFORM TEST 0.8  PROT 5.8* 5.9* 5.7* QUANTITY NOT SUFFICIENT, UNABLE TO PERFORM TEST 6.0*  ALBUMIN 2.8* 3.0* 2.9* QUANTITY NOT SUFFICIENT, UNABLE TO PERFORM TEST 3.1*   No results for input(s): LIPASE, AMYLASE in the last 168 hours. No results for input(s): AMMONIA in the last 168 hours. Coagulation Profile: No results for  input(s): INR, PROTIME in the last 168 hours. Cardiac Enzymes: No results for input(s): CKTOTAL, CKMB, CKMBINDEX, TROPONINI in the last 168 hours. BNP (last  3 results) No results for input(s): PROBNP in the last 8760 hours. HbA1C: No results for input(s): HGBA1C in the last 72 hours. CBG: Recent Labs  Lab 07/07/19 2216 07/08/19 2226 07/11/19 2330 07/12/19 0356  GLUCAP 102* 107* 106* 104*   Lipid Profile: No results for input(s): CHOL, HDL, LDLCALC, TRIG, CHOLHDL, LDLDIRECT in the last 72 hours. Thyroid Function Tests: No results for input(s): TSH, T4TOTAL, FREET4, T3FREE, THYROIDAB in the last 72 hours. Anemia Panel: No results for input(s): VITAMINB12, FOLATE, FERRITIN, TIBC, IRON, RETICCTPCT in the last 72 hours. Sepsis Labs: No results for input(s): PROCALCITON, LATICACIDVEN in the last 168 hours.  Recent Results (from the past 240 hour(s))  Culture, blood (Routine x 2)     Status: None   Collection Time: 07/04/19 11:34 PM   Specimen: BLOOD  Result Value Ref Range Status   Specimen Description BLOOD LEFT ANTECUBITAL  Final   Special Requests   Final    BOTTLES DRAWN AEROBIC AND ANAEROBIC Blood Culture results may not be optimal due to an inadequate volume of blood received in culture bottles   Culture   Final    NO GROWTH 5 DAYS Performed at [facility], [place] [address] [place], [place] [address]    Report Status 07/10/2019 FINAL  Final  Culture, blood (Routine x 2)     Status: None   Collection Time: 07/04/19 11:34 PM   Specimen: BLOOD  Result Value Ref Range Status   Specimen Description BLOOD RIGHT ANTECUBITAL  Final   Special Requests   Final    BOTTLES DRAWN AEROBIC AND ANAEROBIC Blood Culture results may not be optimal due to an excessive volume of blood received in culture bottles   Culture   Final    NO GROWTH 5 DAYS Performed at [facility], [place] [address], [place], [place] [address]    Report Status 07/10/2019 FINAL  Final  Urine culture      Status: None   Collection Time: 07/04/19 11:44 PM   Specimen: Urine, Catheterized  Result Value Ref Range Status   Specimen Description URINE, CATHETERIZED  Final   Special Requests NONE  Final   Culture   Final    NO GROWTH Performed at [facility] [address], [place], [place] [address]    Report Status 07/06/2019 FINAL  Final  Respiratory Panel by RT PCR (Flu A&B, Covid) - Nasopharyngeal Swab     Status: None   Collection Time: 07/05/19 12:36 AM   Specimen: Nasopharyngeal Swab  Result Value Ref Range Status   SARS Coronavirus 2 by RT PCR NEGATIVE NEGATIVE Final    Comment: (NOTE) SARS-CoV-2 target nucleic acids are NOT DETECTED. The SARS-CoV-2 RNA is generally detectable in upper respiratoy specimens during the acute phase of infection. The lowest concentration of SARS-CoV-2 viral copies this assay can detect is 131 copies/mL. A negative result does not preclude SARS-Cov-2 infection and should not be used as the sole basis for treatment or other patient management decisions. A negative result may occur with  improper specimen collection/handling, submission of specimen other than nasopharyngeal swab, presence of viral mutation(s) within the areas targeted by this assay, and inadequate number of viral copies (<131 copies/mL). A negative result must be combined with clinical observations, patient history, and epidemiological information. The expected result is Negative. Fact Sheet for Patients:  PinkCheek.be Fact Sheet for Healthcare Providers:  GravelBags.it This test is not yet ap proved or cleared by the [place] and  has been authorized for detection and/or diagnosis of SARS-CoV-2 by FDA under an Emergency Use Authorization (EUA). This EUA will remain  in effect (meaning this test can be used) for the duration of the COVID-19 declaration under Section 564(b)(1) of the Act, 21 U.S.C. section  360bbb-3(b)(1), unless the authorization is terminated or revoked sooner.    Influenza A by PCR NEGATIVE NEGATIVE Final   Influenza B by PCR NEGATIVE NEGATIVE Final    Comment: (NOTE) The Xpert Xpress SARS-CoV-2/FLU/RSV assay is intended as an aid in  the diagnosis of influenza from Nasopharyngeal swab specimens and  should not be used as a sole basis for treatment. Nasal washings and  aspirates are unacceptable for Xpert Xpress SARS-CoV-2/FLU/RSV  testing. Fact Sheet for Patients: PinkCheek.be Fact Sheet for Healthcare Providers: GravelBags.it This test is not yet approved or cleared by the [place] FDA and  has been authorized for detection and/or diagnosis of SARS-CoV-2 by  FDA under an Emergency Use Authorization (EUA). This EUA will remain  in effect (meaning this test can be used) for the duration of the  Covid-19 declaration under Section 564(b)(1) of the Act, 21  U.S.C. section 360bbb-3(b)(1), unless the authorization is  terminated or revoked. Performed at [facility], [place] [address] [place], [place] [address]   MRSA PCR Screening     Status: None   Collection Time: 07/05/19  6:11 AM   Specimen: Nasal Mucosa; Nasopharyngeal  Result Value Ref Range Status   MRSA by PCR NEGATIVE NEGATIVE Final    Comment:        The GeneXpert MRSA Assay (FDA approved for NASAL specimens only), is one component of a comprehensive MRSA colonization surveillance program. It is not intended to diagnose MRSA infection nor to guide or monitor treatment for MRSA infections. Performed at [facility], [place] [address], [place], [place] [address]   Culture, blood (routine x 2)     Status: None (Preliminary result)   Collection Time: 07/12/19  3:21 PM   Specimen: BLOOD  Result Value Ref Range Status   Specimen Description BLOOD LEFT HAND  Final   Special Requests   Final    BOTTLES DRAWN AEROBIC ONLY Blood Culture  results may not be optimal due to an excessive volume of blood received in culture bottles   Culture   Final    NO GROWTH 2 DAYS Performed at [facility], [place] [address], [place], [place] [address]    Report Status PENDING  Incomplete  Culture, blood (routine x 2)     Status: None (Preliminary result)   Collection Time: 07/12/19  3:25 PM   Specimen: BLOOD  Result Value Ref Range Status   Specimen Description BLOOD LEFT HAND  Final   Special Requests   Final    BOTTLES DRAWN AEROBIC ONLY Blood Culture adequate volume   Culture   Final    NO GROWTH 2 DAYS Performed at [facility], [place] [address], [place], [place] [address]    Report Status PENDING  Incomplete      Radiology Studies: MR BRAIN WO CONTRAST  Result Date: 07/13/2019 CLINICAL DATA:  79 year old male with history of NPH and VP shunt. Altered mental status and fever. Found down earlier this month. EXAM: MRI HEAD WITHOUT CONTRAST TECHNIQUE: Multiplanar, multiecho pulse sequences of the brain and surrounding structures were obtained without intravenous contrast. COMPARISON:  Head CT 07/05/2019.  Brain MRI 02/06/2017. FINDINGS: Brain: A right posterior approach shunt catheter traverses  both the right and left lateral ventricles terminating in the body of the left lateral ventricle. Ventriculomegaly appears mildly increased compared to 2018, but no transependymal edema is suspected. Mild shunt related susceptibility artifact. No restricted diffusion to suggest acute infarction. No midline shift, mass effect, evidence of mass lesion, extra-axial collection or acute intracranial hemorrhage. Cervicomedullary junction and pituitary are within normal limits. Widespread and patchy bilateral white matter T2 and FLAIR hyperintensity is stable since 2018. Increased T2 and FLAIR hyperintensity in the right lentiform, but chronic. Patchy increased T2 hyperintensity in the pons also. No cortical encephalomalacia or chronic cerebral blood  products are evident. No other new signal abnormality. Vascular: Major intracranial vascular flow voids are stable since 2018. Skull and upper cervical spine: Negative for age visible cervical spine. Normal bone marrow signal. Sinuses/Orbits: Stable and negative. Other: Mastoids remain clear. Visible internal auditory structures appear normal. Trace retained secretions in the pharynx. IMPRESSION: 1.  No acute intracranial abnormality. 2. New ventricular shunt since a 2018 MRI. No adverse shunt features, although ventriculomegaly appears slightly increased since that time. 3. 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 Electronically Signed   By: Genevie Ann M.D.   On: 07/13/2019 17:25   DG CHEST PORT 1 VIEW  Result Date: 07/12/2019 CLINICAL DATA:  Hypoxia. EXAM: PORTABLE CHEST 1 VIEW COMPARISON:  07/07/2019 FINDINGS: Visualized segment of patient's right-sided ventriculoperitoneal shunt is unchanged. Stable elevation of the right hemidiaphragm. Improved left base opacification likely improving effusion/atelectasis. Cardiomediastinal silhouette and remainder of the exam is unchanged. IMPRESSION: Interval improvement left base opacification likely improving effusion/atelectasis. Stable elevation right hemidiaphragm. Electronically Signed   By: Marin Olp M.D.   On: 07/12/2019 16:47     Scheduled Meds: . amLODipine  5 mg Oral Daily  . aspirin EC  81 mg Oral Daily  . Chlorhexidine Gluconate Cloth  6 each Topical Daily  . enoxaparin (LOVENOX) injection  40 mg Subcutaneous Q24H  . feeding supplement (ENSURE ENLIVE)  237 mL Oral BID BM  . feeding supplement (PRO-STAT SUGAR FREE 64)  30 mL Oral Daily  . influenza vaccine adjuvanted  0.5 mL Intramuscular Tomorrow-1000  . multivitamin with minerals  1 tablet Oral Daily  . pantoprazole  20 mg Oral QODAY  . potassium chloride SA  40 mEq Oral BID  . senna-docusate  1 tablet Oral Daily  . vitamin B-12  1,000  mcg Oral Daily   Continuous Infusions:   LOS: 9 days    Time spent: 25 minutes spent in the coordination of care today.    Jonnie Finner, DO [facility]  If 7PM-7AM, please contact night-coverage [email or web address] 07/14/2019, 2:53 PM

## 2019-07-14 NOTE — Progress Notes (Signed)
Was called to reprogram this patient's shunt after MRI. Reprogrammed to 90. Patient tolerated well.

## 2019-07-14 NOTE — Progress Notes (Signed)
Nutrition Follow-up  DOCUMENTATION CODES:   Not applicable  INTERVENTION:  Continue Ensure Enlive po BID, each supplement provides 350 kcal and 20 grams of protein  Continue 30 ml Prostat po once daily, each supplement provides 100 kcal and 15 grams of protein.   Encourage adequate PO intake.   NUTRITION DIAGNOSIS:   Increased nutrient needs related to acute illness(sepsis PNA) as evidenced by estimated needs; ongoing  GOAL:   Patient will meet greater than or equal to 90% of their needs; progressing  MONITOR:   PO intake, Supplement acceptance, Skin, Weight trends, Labs, I & O's  REASON FOR ASSESSMENT:   Consult Assessment of nutrition requirement/status  ASSESSMENT:   79 y.o. male,  medical history including but not limited to hypertension, Dm2, prostate cancer s/p XRT, OSA on CPAP, NPH s/p VP shunt, presenting with altered mental status associated with fever, tachycardia, hypoxia and tachypnea after being found on the floor.  Chest x-ray noted acute infiltrate.Patient admitted for acute hypoxic respiratory failure due to pneumonia with secondary sepsis of pulmonary etiology.  Pt is currently on a dysphagia 2 diet with thin liquids. Meal completion has been varied from 5-75% with 50% intake at lunch today. Pt currently has Ensure and Prostat ordered and has been consuming them. RD to continue with current orders to aid in caloric and protein needs. Plans for discharge to SNF tomorrow.   Labs and medications reviewed.   Diet Order:   Diet Order            DIET DYS 2 Room service appropriate? No; Fluid consistency: Thin  Diet effective now              EDUCATION NEEDS:   Not appropriate for education at this time  Skin:  Skin Assessment: Reviewed RN Assessment  Last BM:  2/21  Height:   Ht Readings from Last 1 Encounters:  07/05/19 5\' 9"  (1.753 m)    Weight:   Wt Readings from Last 1 Encounters:  07/05/19 80 kg    Ideal Body Weight:  72.7 kg  BMI:   Body mass index is 26.05 kg/m.  Estimated Nutritional Needs:   Kcal:  2000-2200  Protein:  100-110 grams  Fluid:  >/= 2 L/day    Corrin Parker, MS, RD, LDN Pager # [phone] After hours/ weekend pager # [phone]

## 2019-07-14 NOTE — Progress Notes (Signed)
Physical Therapy Treatment Patient Details Name: Jeremiah [name] MRN: [id number] DOB: 02-19-1941 Today's Date: 07/14/2019    History of Present Illness Pt is a 79 y.o. male admitted 07/04/19 with AMS, fever, tachycardia after being found down by family. Worked up for acute respiratory failure and sepsis. CXR with acute infiltrate. Chest CT negative for PE. Head/cervical CT without acute abnormality, VP shunt stable. EEG suggestive of generalized encephalopathy with no focal seizures. PMH includes NPH s/p VP shunt, prostate CA, DM2, HTN, OSA on CPAP, OA.   PT Comments    Pt slowly progressing with mobility. Requires min-maxA+2 for transfer and gait training; pt with significant forward flexed posture, which increases with fatigue, requiring significant assist to maintain balance. Based on current functional status, do not feel pt could tolerate intensive CIR-level therapies, therefore continue to recommend SNF-level therapies to maximize functional mobility and independence prior tor return home.    Follow Up Recommendations  SNF;Supervision/Assistance - 24 hour     Equipment Recommendations  (defer)    Recommendations for Other Services       Precautions / Restrictions Precautions Precautions: Fall Restrictions Weight Bearing Restrictions: No    Mobility  Bed Mobility Overal bed mobility: Needs Assistance Bed Mobility: Supine to Sit     Supine to sit: Mod assist;+2 for physical assistance;HOB elevated     General bed mobility comments: Will initiate movement for <5 sec, then stops, requiring frequent cues for sequencing, assist to reaching RUE over to bed rail; modA+2 to elevate trunk and scoot hips to EOB  Transfers Overall transfer level: Needs assistance Equipment used: 2 person hand held assist;Rolling walker (2 wheeled) Transfers: Sit to/from Stand Sit to Stand: Mod assist;+2 physical assistance         General transfer comment: Performed multiple sit<>stands from  bed and recliner, practiced with and without RW, modA+2 to assist trunk elevation and cue hip extension; very forward flexed, fatigued(?) posture with difficulty achieving fully upright; upright posture somewhat improved with use of RW  Ambulation/Gait Ambulation/Gait assistance: Min assist;Mod assist;+2 physical assistance Gait Distance (Feet): 2 Feet Assistive device: 2 person hand held assist Gait Pattern/deviations: Shuffle;Trunk flexed;Leaning posteriorly   Gait velocity interpretation: <1.31 ft/sec, indicative of household ambulator General Gait Details: Slow, shuffling, unsteady steps from bed to recliner with BUE support and minA+2, pt attempting to sit prematurely requiring mod-maxA to control descent   Stairs             Wheelchair Mobility    Modified Rankin (Stroke Patients Only)       Balance Overall balance assessment: Needs assistance Sitting-balance support: Bilateral upper extremity supported;Feet supported Sitting balance-Leahy Scale: Poor Sitting balance - Comments: leaning L and posteriorly requiring min-maxA to maintain sitting balance at EOB despite verbal and visual cues to correct Postural control: Left lateral lean;Posterior lean Standing balance support: During functional activity;Bilateral upper extremity supported Standing balance-Leahy Scale: Poor                              Cognition Arousal/Alertness: Awake/alert;Lethargic Behavior During Therapy: Flat affect Overall Cognitive Status: No family/caregiver present to determine baseline cognitive functioning Area of Impairment: Attention;Following commands;Problem solving;Awareness                   Current Attention Level: Focused   Following Commands: Follows one step commands inconsistently;Follows one step commands with increased time   Awareness: Intellectual Problem Solving: Slow processing;Decreased initiation;Requires verbal cues General  Comments: Initially more  conversant/answering questions, becoming less responsive to conversation by end of session (fatigue-related?)      Exercises      General Comments General comments (skin integrity, edema, etc.): SpO2 95% on 3L O2, post-mobility BP 123/82      Pertinent Vitals/Pain Pain Assessment: Faces Faces Pain Scale: No hurt Pain Descriptors / Indicators: Tiring Pain Intervention(s): Monitored during session    Home Living                      Prior Function            PT Goals (current goals can now be found in the care plan section) Progress towards PT goals: Progressing toward goals    Frequency    Min 2X/week      PT Plan Current plan remains appropriate    Co-evaluation              AM-PAC PT "6 Clicks" Mobility   Outcome Measure  Help needed turning from your back to your side while in a flat bed without using bedrails?: A Lot Help needed moving from lying on your back to sitting on the side of a flat bed without using bedrails?: A Lot Help needed moving to and from a bed to a chair (including a wheelchair)?: A Lot Help needed standing up from a chair using your arms (e.g., wheelchair or bedside chair)?: A Lot Help needed to walk in hospital room?: A Lot Help needed climbing 3-5 steps with a railing? : Total 6 Click Score: 11    End of Session   Activity Tolerance: Patient tolerated treatment well;Patient limited by fatigue Patient left: in chair;with call bell/phone within reach Nurse Communication: Mobility status(chair alarm under pt but no box; RN/NT to find one) PT Visit Diagnosis: Unsteadiness on feet (R26.81);Muscle weakness (generalized) (M62.81)     Time: 8099-8338 PT Time Calculation (min) (ACUTE ONLY): 30 min  Charges:  $Therapeutic Activity: 8-22 mins                    Mabeline Caras, PT, DPT Acute Rehabilitation Services  Pager [phone] Office [phone]  Derry Lory 07/14/2019, 12:07 PM

## 2019-07-15 DIAGNOSIS — J9600 Acute respiratory failure, unspecified whether with hypoxia or hypercapnia: Secondary | ICD-10-CM | POA: Diagnosis not present

## 2019-07-15 DIAGNOSIS — R279 Unspecified lack of coordination: Secondary | ICD-10-CM | POA: Diagnosis not present

## 2019-07-15 DIAGNOSIS — S60410A Abrasion of right index finger, initial encounter: Secondary | ICD-10-CM | POA: Diagnosis not present

## 2019-07-15 DIAGNOSIS — S0990XA Unspecified injury of head, initial encounter: Secondary | ICD-10-CM | POA: Diagnosis not present

## 2019-07-15 DIAGNOSIS — Z982 Presence of cerebrospinal fluid drainage device: Secondary | ICD-10-CM | POA: Diagnosis not present

## 2019-07-15 DIAGNOSIS — R000 Tachycardia, unspecified: Secondary | ICD-10-CM | POA: Diagnosis not present

## 2019-07-15 DIAGNOSIS — J390 Retropharyngeal and parapharyngeal abscess: Secondary | ICD-10-CM | POA: Diagnosis not present

## 2019-07-15 DIAGNOSIS — K5732 Diverticulitis of large intestine without perforation or abscess without bleeding: Secondary | ICD-10-CM | POA: Diagnosis not present

## 2019-07-15 DIAGNOSIS — Z743 Need for continuous supervision: Secondary | ICD-10-CM | POA: Diagnosis not present

## 2019-07-15 DIAGNOSIS — Z993 Dependence on wheelchair: Secondary | ICD-10-CM | POA: Diagnosis not present

## 2019-07-15 DIAGNOSIS — R451 Restlessness and agitation: Secondary | ICD-10-CM | POA: Diagnosis not present

## 2019-07-15 DIAGNOSIS — S50312A Abrasion of left elbow, initial encounter: Secondary | ICD-10-CM | POA: Diagnosis not present

## 2019-07-15 DIAGNOSIS — Z79899 Other long term (current) drug therapy: Secondary | ICD-10-CM | POA: Diagnosis not present

## 2019-07-15 DIAGNOSIS — Z8249 Family history of ischemic heart disease and other diseases of the circulatory system: Secondary | ICD-10-CM | POA: Diagnosis not present

## 2019-07-15 DIAGNOSIS — E119 Type 2 diabetes mellitus without complications: Secondary | ICD-10-CM | POA: Diagnosis not present

## 2019-07-15 DIAGNOSIS — S299XXA Unspecified injury of thorax, initial encounter: Secondary | ICD-10-CM | POA: Diagnosis not present

## 2019-07-15 DIAGNOSIS — H9190 Unspecified hearing loss, unspecified ear: Secondary | ICD-10-CM | POA: Diagnosis not present

## 2019-07-15 DIAGNOSIS — I10 Essential (primary) hypertension: Secondary | ICD-10-CM | POA: Diagnosis not present

## 2019-07-15 DIAGNOSIS — T8501XA Breakdown (mechanical) of ventricular intracranial (communicating) shunt, initial encounter: Secondary | ICD-10-CM | POA: Diagnosis not present

## 2019-07-15 DIAGNOSIS — M50321 Other cervical disc degeneration at C4-C5 level: Secondary | ICD-10-CM | POA: Diagnosis not present

## 2019-07-15 DIAGNOSIS — G912 (Idiopathic) normal pressure hydrocephalus: Secondary | ICD-10-CM | POA: Diagnosis not present

## 2019-07-15 DIAGNOSIS — I679 Cerebrovascular disease, unspecified: Secondary | ICD-10-CM | POA: Diagnosis not present

## 2019-07-15 DIAGNOSIS — J9621 Acute and chronic respiratory failure with hypoxia: Secondary | ICD-10-CM | POA: Diagnosis not present

## 2019-07-15 DIAGNOSIS — Z820 Family history of epilepsy and other diseases of the nervous system: Secondary | ICD-10-CM | POA: Diagnosis not present

## 2019-07-15 DIAGNOSIS — E1159 Type 2 diabetes mellitus with other circulatory complications: Secondary | ICD-10-CM | POA: Diagnosis not present

## 2019-07-15 DIAGNOSIS — R4020 Unspecified coma: Secondary | ICD-10-CM | POA: Diagnosis not present

## 2019-07-15 DIAGNOSIS — J9601 Acute respiratory failure with hypoxia: Secondary | ICD-10-CM | POA: Diagnosis not present

## 2019-07-15 DIAGNOSIS — R410 Disorientation, unspecified: Secondary | ICD-10-CM | POA: Diagnosis not present

## 2019-07-15 DIAGNOSIS — Z66 Do not resuscitate: Secondary | ICD-10-CM | POA: Diagnosis not present

## 2019-07-15 DIAGNOSIS — Z87440 Personal history of urinary (tract) infections: Secondary | ICD-10-CM | POA: Diagnosis not present

## 2019-07-15 DIAGNOSIS — G9340 Encephalopathy, unspecified: Secondary | ICD-10-CM | POA: Diagnosis not present

## 2019-07-15 DIAGNOSIS — M1990 Unspecified osteoarthritis, unspecified site: Secondary | ICD-10-CM | POA: Diagnosis not present

## 2019-07-15 DIAGNOSIS — R109 Unspecified abdominal pain: Secondary | ICD-10-CM | POA: Diagnosis not present

## 2019-07-15 DIAGNOSIS — R531 Weakness: Secondary | ICD-10-CM | POA: Diagnosis not present

## 2019-07-15 DIAGNOSIS — S0081XA Abrasion of other part of head, initial encounter: Secondary | ICD-10-CM | POA: Diagnosis not present

## 2019-07-15 DIAGNOSIS — R0902 Hypoxemia: Secondary | ICD-10-CM | POA: Diagnosis not present

## 2019-07-15 DIAGNOSIS — Z7982 Long term (current) use of aspirin: Secondary | ICD-10-CM | POA: Diagnosis not present

## 2019-07-15 DIAGNOSIS — J189 Pneumonia, unspecified organism: Secondary | ICD-10-CM | POA: Diagnosis not present

## 2019-07-15 DIAGNOSIS — Z9181 History of falling: Secondary | ICD-10-CM | POA: Diagnosis not present

## 2019-07-15 DIAGNOSIS — N179 Acute kidney failure, unspecified: Secondary | ICD-10-CM | POA: Diagnosis not present

## 2019-07-15 DIAGNOSIS — Z8546 Personal history of malignant neoplasm of prostate: Secondary | ICD-10-CM | POA: Diagnosis not present

## 2019-07-15 DIAGNOSIS — E876 Hypokalemia: Secondary | ICD-10-CM

## 2019-07-15 DIAGNOSIS — G4733 Obstructive sleep apnea (adult) (pediatric): Secondary | ICD-10-CM | POA: Diagnosis not present

## 2019-07-15 DIAGNOSIS — S199XXA Unspecified injury of neck, initial encounter: Secondary | ICD-10-CM | POA: Diagnosis not present

## 2019-07-15 MED ORDER — AMLODIPINE BESYLATE 5 MG PO TABS: 5.0000 mg | ORAL_TABLET | Freq: Every day | ORAL | 0 refills | Status: DC

## 2019-07-15 MED ORDER — PRO-STAT SUGAR FREE PO LIQD: 30.0000 mL | Freq: Every day | ORAL | 0 refills | Status: DC

## 2019-07-15 MED ORDER — ENSURE ENLIVE PO LIQD: 237.0000 mL | Freq: Two times a day (BID) | ORAL | 12 refills | Status: DC

## 2019-07-15 NOTE — Discharge Summary (Signed)
. Physician Discharge Summary  Jeremiah [name] BJS:[id number] DOB: [birth year] DOA: 07/04/2019  PCP: [name], MD  Admit date: 07/04/2019 Discharge date: 07/15/2019  Admitted From: Home Disposition:  Discharged to SNF  Recommendations for Outpatient Follow-up:  1. Follow up with PCP in 1 weeks 2. Please obtain BMP/CBC in one week  Discharge Condition: Stable  CODE STATUS: DNR   Brief/Interim Summary: 79 y.o.male,medical history including but not limited to hypertension, Dm2, prostate cancer s/p XRT, OSA on CPAP, NPH s/p VP shunt, presenting with altered mental status associated with fever, tachycardia, hypoxia and tachypnea after being found on the floor.According to son, patient at baseline is quite "sharp". He does have history of NPH but not very ataxic usually. Apparently patient's daughter and wife went out and when returned home, they found patient on the floor not knowing how he got there. He appeared to have vomited. He was apparently doing well and at baseline before they went out. No report of family noting fever or chills or diarrhea or cough preceding the episode. Patient has video monitor in the room but son concerned that he is not able to be redirected.  ED course:Labs with AKI,creatinine 1.51 and hypokalemia .CT C-spine and head negative admission. Chest x-ray noted acute infiltrate.CT angiogram 07/05/2019-negative PE-bilateral lower lobe atelectasis versus infiltrate.  Hospital course: Patient admitted to [facility] for acute hypoxic respiratory failure and sepsis due to presumed pneumonia with IV antibiotics cefepime with Zithromax-MRSA PCR negative and therefore vancomycin discontinued.Hospital course complicated by ongoing delirium as well as episode of hypoxia with O2 sats in the 70s and unresponsiveness on the night of 2/15 when RRT was summoned.  07/15/19: Mentation has been stable of the last several days. W/u complete. This may be his new baseline or it may just be  a long recovery. At this point out of the acute phase. Needs SNF rehab. Will discharge to SNF today.   Discharge Diagnoses:  Principal Problem:   Acute respiratory failure with hypoxia (HCC) Active Problems:   HTN (hypertension)   Pneumonia   Tachycardia   AMS (altered mental status)   Palliative care by specialist   Goals of care, counseling/discussion   DNR (do not resuscitate)   Muscular weakness   Xerostomia  Acute hypoxic respiratory failure Sepsis syndrome ?Aspiration - Patient febrile, tachycardic with leukocytosis on presentation. - CT angiogram 07/05/2019:negative PE;bilateral lower lobe atelectasis versus infiltrate reported. - On Unasyn for suspected asp PNA - Bld Cx NTD, UCX neg. - now on 1L [place] and sats well -07/10/19: abx complete today - 07/11/19: SLP advancing diet. On and off 1L [place] and sats are ok. Let's get him IS today. WBC ok and a febrile ON. Completed abx. - 07/12/19: Still not completely off O2. Repeat CXR. SLP onboard, appreciate assistance.Let's see if we can get him working with IS. He has intermittent fevers; question if this is related to ongoing dysphagia. Check CXR, bld Cx. He is on Day 7 of Unasyn (d/c's today). - 07/13/19: completed abx; fevers responsive to APAP, Bld Cx pending; on and off O2 support over last couple days     - 07/14/19: O2 at night? Seems to be fine during the day. CXR from 07/12/19 is good. Will check ABG, but ultimately may just need O2 at night. Afebrile ON  Acute encephalopathy Delirium - Patient found on floor - CT C-spine and head negative admission.  - Patient does have a history of NPH and is s/p VP shunt-not sure if patient had a  mechanical fall and now confused due to head trauma/concussion.  - MRI head pending; will avoid Ativan/any benzodiazepine for pre MRI sedation as it might make his delirium worse. - Neurology onboard, appreciate assistance. - He did undergo  EEG which was suggestive of generalized encephalopathy with no focal seizures. - Continue delirium/aspiration precautions.  - continue melatonin/B12 supplements/aspirin that he takes at home. - 07/10/19: d/c quetiapine; let's encourage activity, get him up OOB, consult PT - spoke with neurology; no MRI needed at this point - 07/11/19: SLP rec's PC consult, have consulted for goals of care; he is more alert today (name, place) and joking. Push activity as able - 07/12/19:Mentation is not completely back to baseline; this could be a case of slow resolution. The only thing that has not been checked was the MRI brain. Spoke with neurosurgery about MRI follow up. They will be able to reset the VP shunt within 24hr of MRI. Let's check MR to complete w/u - 07/13/19: remains A&O x 2, MRI pending. Check ABG. Otherwise, continue current Tx.     - 07/14/19: MRI negative for acute change. ABG pending. May just be new baseline or long recovery     - 07/15/19: Remains A&O x 2; has been stable at this mentation for several days; ok for d/c to SNF  Acute kidney injury - Due to dehydration versus sepsis with admitting creatinine 1.51 - resolved  Hypokalemia  - On HCTZ and potassium 10 mEq at home. - increase K+ to 19mEq and also add IV run of 21mEq - 07/10/19: K+ is ok today; Mg2+ ok - 07/11/19: K+ is down again today, will increase to BID dosing, monitor - 07/12/19: K+ looks better today, will continue current dosing for now - 07/13/19: K+ is good today, monitor  Hypertension - HCTZ held on admission.  - SBP up at 140-170 - normalized with addition of low-dose Norvasc   - BP ok on current regimen  Prolonged QTC  - Previous EKG from December 2020 showed prolonged QT interval at 520 ms.  - Repeat EKG shows significant improvement with normal QT C at 440 ms.  - Monitor periodically while utilizing psych meds/Seroquel.   - Replace electrolytes to keep potassium close to 4 and magnesium to 2.  History of diabetes mellitus - not on any medications at home.  - Hemoglobin A1c at 6.0. - Glucose monitoring with sliding scale insulin as needed - 07/15/19: glucose is ok, has not really required any meds here  GERD:  - continue protonix  Generalized weakness Debility - PT rec's SNF. Have d/w son. TOC consulted for assistance in this     - 07/15/19: COVID screen negative. Ok to d/c to SNF today.  OSA     - On CPAP at home per daughter; needs it nightly  Discharge Instructions   Allergies as of 07/15/2019   No Known Allergies     Medication List    STOP taking these medications   hydrochlorothiazide 12.5 MG capsule Commonly known as: MICROZIDE     TAKE these medications   acetaminophen 500 MG tablet Commonly known as: TYLENOL Take 1,000 mg by mouth at bedtime.   amLODipine 5 MG tablet Commonly known as: NORVASC Take 1 tablet (5 mg total) by mouth daily. Start taking on: July 16, 2019   aspirin EC 81 MG tablet Take 81 mg by mouth daily.   feeding supplement (ENSURE ENLIVE) Liqd Take 237 mLs by mouth 2 (two) times daily between meals.   feeding  supplement (PRO-STAT SUGAR FREE 64) Liqd Take 30 mLs by mouth daily. Start taking on: July 16, 2019   Melatonin 10 MG Caps Take 10 mg by mouth at bedtime as needed (for sleep).   multivitamin with minerals Tabs tablet Take 1 tablet by mouth daily.   pantoprazole 20 MG tablet Commonly known as: PROTONIX Take 20 mg by mouth daily before breakfast.   potassium chloride 10 MEQ tablet Commonly known as: KLOR-CON Take 10 mEq by mouth 2 (two) times daily.   PRESERVISION AREDS 2 PO Take 1 capsule by mouth 2 (two) times daily.   Prevagen Extra Strength 20 MG Caps Generic drug: Apoaequorin Take 20-40 mg by mouth daily.   senna-docusate 8.6-50 MG tablet Commonly known as: Senokot-S Take 1 tablet by  mouth daily.   vitamin B-12 250 MCG tablet Commonly known as: CYANOCOBALAMIN Take 250 mcg by mouth daily.       No Known Allergies  Consultations:  Neurology  Palliative Care   Procedures/Studies: CT Head Wo Contrast  Result Date: 07/05/2019 CLINICAL DATA:  Found on floor, unable to follow commands, lethargic EXAM: CT HEAD WITHOUT CONTRAST CT CERVICAL SPINE WITHOUT CONTRAST TECHNIQUE: Multidetector CT imaging of the head and cervical spine was performed following the standard protocol without intravenous contrast. Multiplanar CT image reconstructions of the cervical spine were also generated. COMPARISON:  CT head 05/14/2019, MRI 06/16/2012, FINDINGS: CT HEAD FINDINGS Brain: Stable positioning of a right parietal approach shunt catheter crossing midline terminating in the body of the left lateral ventricle. Reservoir in the posterior parietal scalp. No catheter discontinuity is seen is a courses towards the base of the right neck. Ventricular caliber is unchanged from prior. Symmetric prominence of the cisterns and sulci compatible with a background of parenchymal volume loss. Patchy areas of white matter hypoattenuation are most compatible with chronic microvascular angiopathy. No evidence of acute infarction, hemorrhage, hydrocephalus, extra-axial collection or mass lesion/mass effect. Vascular: No hyperdense vessel or unexpected calcification. Skull: No calvarial fracture or suspicious osseous lesion. No scalp swelling or hematoma. Sinuses/Orbits: Minimal mural thickening within the ethmoids. Paranasal sinuses are otherwise predominantly clear. Mastoid air cells are clear. Chronic nasal bone deformities are again noted. Included orbital structures are unremarkable. Other: None CT CERVICAL SPINE FINDINGS Alignment: Slight exaggeration of the cervical lordosis. No traumatic listhesis. No abnormally widened, perched or jumped facets. Craniocervical and atlantoaxial articulations are normally  aligned. Mild left lateral neck flexion and rightward head rotation. Skull base and vertebrae: No acute fracture. No primary bone lesion or focal pathologic process. Soft tissues and spinal canal: No pre or paravertebral fluid or swelling. No visible canal hematoma. Benign enthesopathic calcifications in the nuchal ligament. Intact catheter tubing coursing along the right lateral neck to the anterior chest wall. No shunt catheter discontinuity is seen. Disc levels: Multilevel intervertebral disc height loss with spondylitic endplate changes. Features are maximal and C5-6 with a posterior disc osteophyte complex resulting in mild canal stenosis. Uncinate spurring and facet hypertrophic changes result in mild to moderate multilevel foraminal narrowing more pronounced on the right and left. Upper chest: No acute abnormality in the upper chest or imaged lung apices. Other: Cervical carotid atherosclerosis. Normal thyroid. IMPRESSION: 1. No acute intracranial findings. No calvarial fracture or significant scalp swelling. 2. Stable positioning of a right parietal approach shunt catheter. No evidence of shunt catheter discontinuity. 3. Stable mild ventricular dilatation. 4. Chronic microvascular ischemic changes and parenchymal volume loss. 5. No acute fracture or traumatic listhesis of the cervical spine. 6.  Multilevel disc and facet degeneration. Features maximal at C5-6. Electronically Signed   By: Lovena Le M.D.   On: 07/05/2019 00:40   CT ANGIO CHEST PE W OR WO CONTRAST  Result Date: 07/05/2019 CLINICAL DATA:  Elevated D-dimer. Tachycardia. Possible pulmonary emboli. EXAM: CT ANGIOGRAPHY CHEST WITH CONTRAST TECHNIQUE: Multidetector CT imaging of the chest was performed using the standard protocol during bolus administration of intravenous contrast. Multiplanar CT image reconstructions and MIPs were obtained to evaluate the vascular anatomy. CONTRAST:  12mL OMNIPAQUE IOHEXOL 350 MG/ML SOLN COMPARISON:  Chest  radiography same day.  Abdominal CT 10/24/2018. FINDINGS: Cardiovascular: Pulmonary arterial opacification is good. There are no pulmonary emboli. Heart size upper limits of normal. Coronary artery calcification is present. There is aortic atherosclerosis. Mediastinum/Nodes: No mediastinal or hilar mass or lymphadenopathy. Lungs/Pleura: Chronic elevation of the right hemidiaphragm with volume loss and or infiltrate in the right lower lobe. Lesser volume loss/infiltrate also in the left lower lobe. No evidence of pleural fluid. Upper Abdomen: No acute abdominal finding. Elevated right hemidiaphragm as noted above, with interposition of the bowel between the liver and the diaphragm. Musculoskeletal: Negative Review of the MIP images confirms the above findings. IMPRESSION: No pulmonary emboli. Coronary artery calcification.  Aortic atherosclerosis. Elevation of the right hemidiaphragm. Atelectasis/infiltrate in both lower lobes, right worse than left. Some degree of this is chronic, but it appears more pronounced than was noted on an abdominal CT of 10/24/2018. Electronically Signed   By: Nelson Chimes M.D.   On: 07/05/2019 05:32   CT Cervical Spine Wo Contrast  Result Date: 07/05/2019 CLINICAL DATA:  Found on floor, unable to follow commands, lethargic EXAM: CT HEAD WITHOUT CONTRAST CT CERVICAL SPINE WITHOUT CONTRAST TECHNIQUE: Multidetector CT imaging of the head and cervical spine was performed following the standard protocol without intravenous contrast. Multiplanar CT image reconstructions of the cervical spine were also generated. COMPARISON:  CT head 05/14/2019, MRI 06/16/2012, FINDINGS: CT HEAD FINDINGS Brain: Stable positioning of a right parietal approach shunt catheter crossing midline terminating in the body of the left lateral ventricle. Reservoir in the posterior parietal scalp. No catheter discontinuity is seen is a courses towards the base of the right neck. Ventricular caliber is unchanged from  prior. Symmetric prominence of the cisterns and sulci compatible with a background of parenchymal volume loss. Patchy areas of white matter hypoattenuation are most compatible with chronic microvascular angiopathy. No evidence of acute infarction, hemorrhage, hydrocephalus, extra-axial collection or mass lesion/mass effect. Vascular: No hyperdense vessel or unexpected calcification. Skull: No calvarial fracture or suspicious osseous lesion. No scalp swelling or hematoma. Sinuses/Orbits: Minimal mural thickening within the ethmoids. Paranasal sinuses are otherwise predominantly clear. Mastoid air cells are clear. Chronic nasal bone deformities are again noted. Included orbital structures are unremarkable. Other: None CT CERVICAL SPINE FINDINGS Alignment: Slight exaggeration of the cervical lordosis. No traumatic listhesis. No abnormally widened, perched or jumped facets. Craniocervical and atlantoaxial articulations are normally aligned. Mild left lateral neck flexion and rightward head rotation. Skull base and vertebrae: No acute fracture. No primary bone lesion or focal pathologic process. Soft tissues and spinal canal: No pre or paravertebral fluid or swelling. No visible canal hematoma. Benign enthesopathic calcifications in the nuchal ligament. Intact catheter tubing coursing along the right lateral neck to the anterior chest wall. No shunt catheter discontinuity is seen. Disc levels: Multilevel intervertebral disc height loss with spondylitic endplate changes. Features are maximal and C5-6 with a posterior disc osteophyte complex resulting in mild canal stenosis. Uncinate  spurring and facet hypertrophic changes result in mild to moderate multilevel foraminal narrowing more pronounced on the right and left. Upper chest: No acute abnormality in the upper chest or imaged lung apices. Other: Cervical carotid atherosclerosis. Normal thyroid. IMPRESSION: 1. No acute intracranial findings. No calvarial fracture or  significant scalp swelling. 2. Stable positioning of a right parietal approach shunt catheter. No evidence of shunt catheter discontinuity. 3. Stable mild ventricular dilatation. 4. Chronic microvascular ischemic changes and parenchymal volume loss. 5. No acute fracture or traumatic listhesis of the cervical spine. 6. Multilevel disc and facet degeneration. Features maximal at C5-6. Electronically Signed   By: Lovena Le M.D.   On: 07/05/2019 00:40   MR BRAIN WO CONTRAST  Result Date: 07/13/2019 CLINICAL DATA:  79 year old male with history of NPH and VP shunt. Altered mental status and fever. Found down earlier this month. EXAM: MRI HEAD WITHOUT CONTRAST TECHNIQUE: Multiplanar, multiecho pulse sequences of the brain and surrounding structures were obtained without intravenous contrast. COMPARISON:  Head CT 07/05/2019.  Brain MRI 02/06/2017. FINDINGS: Brain: A right posterior approach shunt catheter traverses both the right and left lateral ventricles terminating in the body of the left lateral ventricle. Ventriculomegaly appears mildly increased compared to 2018, but no transependymal edema is suspected. Mild shunt related susceptibility artifact. No restricted diffusion to suggest acute infarction. No midline shift, mass effect, evidence of mass lesion, extra-axial collection or acute intracranial hemorrhage. Cervicomedullary junction and pituitary are within normal limits. Widespread and patchy bilateral white matter T2 and FLAIR hyperintensity is stable since 2018. Increased T2 and FLAIR hyperintensity in the right lentiform, but chronic. Patchy increased T2 hyperintensity in the pons also. No cortical encephalomalacia or chronic cerebral blood products are evident. No other new signal abnormality. Vascular: Major intracranial vascular flow voids are stable since 2018. Skull and upper cervical spine: Negative for age visible cervical spine. Normal bone marrow signal. Sinuses/Orbits: Stable and negative.  Other: Mastoids remain clear. Visible internal auditory structures appear normal. Trace retained secretions in the pharynx. IMPRESSION: 1.  No acute intracranial abnormality. 2. New ventricular shunt since a 2018 MRI. No adverse shunt features, although ventriculomegaly appears slightly increased since that time. 3. 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 Electronically Signed   By: Genevie Ann M.D.   On: 07/13/2019 17:25   DG CHEST PORT 1 VIEW  Result Date: 07/12/2019 CLINICAL DATA:  Hypoxia. EXAM: PORTABLE CHEST 1 VIEW COMPARISON:  07/07/2019 FINDINGS: Visualized segment of patient's right-sided ventriculoperitoneal shunt is unchanged. Stable elevation of the right hemidiaphragm. Improved left base opacification likely improving effusion/atelectasis. Cardiomediastinal silhouette and remainder of the exam is unchanged. IMPRESSION: Interval improvement left base opacification likely improving effusion/atelectasis. Stable elevation right hemidiaphragm. Electronically Signed   By: Marin Olp M.D.   On: 07/12/2019 16:47   DG Chest Port 1 View  Result Date: 07/07/2019 CLINICAL DATA:  Diabetes, hypertension, prostate cancer, altered level of consciousness, fever EXAM: PORTABLE CHEST 1 VIEW COMPARISON:  07/05/2019, 07/04/2019 FINDINGS: Single frontal view of the chest demonstrates a stable cardiac silhouette. Ventriculostomy catheter overlying right chest unchanged. Chronic elevation of the right hemidiaphragm with right basilar atelectasis unchanged. There is increasing consolidation at the left lung base. Small left effusion not excluded. No pneumothorax. IMPRESSION: 1. Progressive left basilar consolidation with likely small left pleural effusion. This could be related to atelectasis given previous CT findings. 2. Chronic elevation right hemidiaphragm with compressive atelectasis right base. Electronically Signed  By: Randa Ngo M.D.   On: 07/07/2019  23:18   DG Chest Portable 1 View  Result Date: 07/05/2019 CLINICAL DATA:  Fever. Lethargy. Hypoxia. EXAM: PORTABLE CHEST 1 VIEW COMPARISON:  Most recent radiograph 03/27/2019 FINDINGS: Persistent low lung volumes. Chronic elevation of right hemidiaphragm unchanged heart size and mediastinal contours. Heterogeneous streaky bibasilar opacities, with slight patchy opacities in the left mid lung. No pleural fluid or pneumothorax. Shunt catheter tubing projects over the right neck and chest wall. IMPRESSION: Low lung volumes with streaky bibasilar and patchy left midlung opacities. Findings are suspicious for pneumonia in the setting of fever. Electronically Signed   By: Keith Rake M.D.   On: 07/05/2019 00:10   EEG adult  Result Date: 07/08/2019 Lora Havens, MD     07/08/2019 10:42 AM Patient Name: Jeremiah [name] MRN: [id number] Epilepsy Attending: Lora Havens Referring Physician/Provider: Dr. Guilford Shi Date: 07/08/2019 Duration: 25.43 minutes Patient history: 79 year old male with history of NPH status post shunting presented with acute respiratory failure, found to be septic.  Patient continues to be altered.  Therefore EEG to evaluate for seizures. Level of alertness: Awake/lethargic AEDs during EEG study: None Technical aspects: This EEG study was done with scalp electrodes positioned according to the 10-20 International system of electrode placement. Electrical activity was acquired at a sampling rate of 500Hz  and reviewed with a high frequency filter of 70Hz  and a low frequency filter of 1Hz . EEG data were recorded continuously and digitally stored. Description: No clear posterior dominant was seen.  EEG showed continuous generalized 3 to 6 Hz theta and delta slowing.  Hyperventilation photic summation were not performed. Abnormality -Continuous slow, generalized IMPRESSION: This study is suggestive of moderate diffuse encephalopathy, nonspecific etiology. No seizures or epileptiform  discharges were seen throughout the recording. Lora Havens   ECHOCARDIOGRAM COMPLETE  Result Date: 07/05/2019    ECHOCARDIOGRAM REPORT   Patient Name:   Kariem BABAK LUCUS Date of Exam: 07/05/2019 Medical Rec #:  [id number]       Height:       69.0 in Accession #:    [id number]      Weight:       176.4 lb Date of Birth:  Mar 21, 1941       BSA:          1.96 m Patient Age:    48 years        BP:           140/91 mmHg Patient Gender: M               HR:           101 bpm. Exam Location:  Inpatient Procedure: 2D Echo, Color Doppler and Cardiac Doppler Indications:    R00.0 Tachycardia  History:        Patient has no prior history of Echocardiogram examinations.                 Risk Factors:Hypertension, Diabetes, Dyslipidemia and Sleep                 Apnea.  Sonographer:    Raquel Sarna Senior RDCS Referring Phys: [address]  Sonographer Comments: Technically difficult due to altered mental status. IMPRESSIONS  1. Left ventricular ejection fraction, by estimation, is 60 to 65%. The left ventricle has normal function. The left ventricle has no regional wall motion abnormalities. There is mildly increased left ventricular hypertrophy. Left ventricular diastolic parameters are indeterminate.  2. Right  ventricular systolic function is moderately reduced. The right ventricular size is mildly enlarged. There is mildly elevated pulmonary artery systolic pressure.  3. Right atrial size was mildly dilated.  4. No pericardial effusion prominent epicardial adipose tissue.  5. The mitral valve is normal in structure and function. No evidence of mitral valve regurgitation. No evidence of mitral stenosis.  6. The aortic valve is normal in structure and function. Aortic valve regurgitation is not visualized. No aortic stenosis is present.  7. Aortic dilatation noted. There is mild dilatation of the aortic root measuring 39 mm.  8. The inferior vena cava is normal in size with greater than 50% respiratory variability, suggesting  right atrial pressure of 3 mmHg. FINDINGS  Left Ventricle: Left ventricular ejection fraction, by estimation, is 60 to 65%. The left ventricle has normal function. The left ventricle has no regional wall motion abnormalities. The left ventricular internal cavity size was normal in size. There is  mildly increased left ventricular hypertrophy. Left ventricular diastolic parameters are indeterminate. Right Ventricle: The right ventricular size is mildly enlarged. No increase in right ventricular wall thickness. Right ventricular systolic function is moderately reduced. There is mildly elevated pulmonary artery systolic pressure. The tricuspid regurgitant velocity is 2.62 m/s, and with an assumed right atrial pressure of 8 mmHg, the estimated right ventricular systolic pressure is 90.2 mmHg. Left Atrium: Left atrial size was normal in size. Right Atrium: Right atrial size was mildly dilated. Pericardium: No pericardial effusion prominent epicardial adipose tissue. There is no evidence of pericardial effusion. Mitral Valve: The mitral valve is normal in structure and function. Normal mobility of the mitral valve leaflets. No evidence of mitral valve regurgitation. No evidence of mitral valve stenosis. Tricuspid Valve: The tricuspid valve is normal in structure. Tricuspid valve regurgitation is mild . No evidence of tricuspid stenosis. Aortic Valve: The aortic valve is normal in structure and function. Aortic valve regurgitation is not visualized. No aortic stenosis is present. Pulmonic Valve: The pulmonic valve was normal in structure. Pulmonic valve regurgitation is trivial. No evidence of pulmonic stenosis. Aorta: The aortic root is normal in size and structure and aortic dilatation noted. There is mild dilatation of the aortic root measuring 39 mm. Venous: The inferior vena cava is normal in size with greater than 50% respiratory variability, suggesting right atrial pressure of 3 mmHg. IAS/Shunts: No atrial level  shunt detected by color flow Doppler.  LEFT VENTRICLE PLAX 2D LVIDd:         4.30 cm LVIDs:         2.70 cm LV PW:         1.10 cm LV IVS:        1.30 cm LVOT diam:     1.90 cm LV SV:         77.40 ml LV SV Index:   28.23 LVOT Area:     2.84 cm  RIGHT VENTRICLE RV S prime:     10.30 cm/s TAPSE (M-mode): 2.1 cm LEFT ATRIUM             Index       RIGHT ATRIUM           Index LA diam:        3.70 cm 1.89 cm/m  RA Area:     19.50 cm LA Vol (A2C):   34.3 ml 17.51 ml/m RA Volume:   51.90 ml  26.50 ml/m LA Vol (A4C):   17.0 ml 8.68 ml/m LA Biplane Vol: 25.8  ml 13.17 ml/m  AORTIC VALVE LVOT Vmax:   139.00 cm/s LVOT Vmean:  99.400 cm/s LVOT VTI:    0.273 m  AORTA Ao Root diam: 3.60 cm Ao Asc diam:  3.90 cm TRICUSPID VALVE TR Peak grad:   27.5 mmHg TR Vmax:        262.00 cm/s  SHUNTS Systemic VTI:  0.27 m Systemic Diam: 1.90 cm Jenkins Rouge MD Electronically signed by Jenkins Rouge MD Signature Date/Time: 07/05/2019/12:15:47 PM    Final       Subjective: "Oh, I'm not sure."  Discharge Exam: Vitals:   07/14/19 2318 07/15/19 0302  BP: 121/80 108/73  Pulse:    Resp:    Temp: 98.1 F (36.7 C) 98.2 F (36.8 C)  SpO2: 95% 96%   Vitals:   07/14/19 1853 07/14/19 1927 07/14/19 2318 07/15/19 0302  BP: 112/75 (!) 147/88 121/80 108/73  Pulse: 86 88    Resp: (!) 21 20    Temp: 98.2 F (36.8 C) 98.8 F (37.1 C) 98.1 F (36.7 C) 98.2 F (36.8 C)  TempSrc: Oral Oral Oral Oral  SpO2: 96% 96% 95% 96%  Weight:      Height:        General: 79 y.o. male resting in bed in NAD Cardiovascular: RRR, +S1, S2, no m/g/r Respiratory: CTABL, no w/r/r, normal WOB GI: BS+, NDNT, soft MSK: No e/c/c Neuro: Alert to name/place, follows commands Psyc: calm/cooperative   The results of significant diagnostics from this hospitalization (including imaging, microbiology, ancillary and laboratory) are listed below for reference.     Microbiology: Recent Results (from the past 240 hour(s))  Culture, blood  (routine x 2)     Status: None (Preliminary result)   Collection Time: 07/12/19  3:21 PM   Specimen: BLOOD  Result Value Ref Range Status   Specimen Description BLOOD LEFT HAND  Final   Special Requests   Final    BOTTLES DRAWN AEROBIC ONLY Blood Culture results may not be optimal due to an excessive volume of blood received in culture bottles   Culture   Final    NO GROWTH 3 DAYS Performed at [facility], [place] [address] [place], [place] [address]    Report Status PENDING  Incomplete  Culture, blood (routine x 2)     Status: None (Preliminary result)   Collection Time: 07/12/19  3:25 PM   Specimen: BLOOD  Result Value Ref Range Status   Specimen Description BLOOD LEFT HAND  Final   Special Requests   Final    BOTTLES DRAWN AEROBIC ONLY Blood Culture adequate volume   Culture   Final    NO GROWTH 3 DAYS Performed at [facility], [place] [address], [place], [place] [address]    Report Status PENDING  Incomplete  SARS CORONAVIRUS 2 (TAT 6-24 HRS) Nasopharyngeal Nasopharyngeal Swab     Status: None   Collection Time: 07/14/19  2:17 PM   Specimen: Nasopharyngeal Swab  Result Value Ref Range Status   SARS Coronavirus 2 NEGATIVE NEGATIVE Final    Comment: (NOTE) SARS-CoV-2 target nucleic acids are NOT DETECTED. The SARS-CoV-2 RNA is generally detectable in upper and lower respiratory specimens during the acute phase of infection. Negative results do not preclude SARS-CoV-2 infection, do not rule out co-infections with other pathogens, and should not be used as the sole basis for treatment or other patient management decisions. Negative results must be combined with clinical observations, patient history, and epidemiological information. The expected result  is Negative. Fact Sheet for Patients: SugarRoll.be Fact Sheet for Healthcare Providers: https://www.woods-mathews.com/ This test is not yet approved or cleared by the [place] FDA and  has been authorized for detection and/or diagnosis of SARS-CoV-2 by FDA under an Emergency Use Authorization (EUA). This EUA will remain  in effect (meaning this test can be used) for the duration of the COVID-19 declaration under Section 56 4(b)(1) of the Act, 21 U.S.C. section 360bbb-3(b)(1), unless the authorization is terminated or revoked sooner. Performed at [facility], [place] [address], [place], [place] [address]      Labs: BNP (last 3 results) No results for input(s): BNP in the last 8760 hours. Basic Metabolic Panel: Recent Labs  Lab 07/08/19 1450 07/09/19 0241 07/09/19 0241 07/10/19 0246 07/11/19 0841 07/12/19 0539 07/13/19 0230 07/13/19 0622  NA  --  139   < > 139 139 136 QUANTITY NOT SUFFICIENT, UNABLE TO PERFORM TEST 132*  K  --  3.2*   < > 3.7 3.3* 3.7 QUANTITY NOT SUFFICIENT, UNABLE TO PERFORM TEST 4.1  CL  --  98   < > 101 101 97* QUANTITY NOT SUFFICIENT, UNABLE TO PERFORM TEST 95*  CO2  --  28   < > 29 27 29  QUANTITY NOT SUFFICIENT, UNABLE TO PERFORM TEST 27  GLUCOSE  --  119*   < > 122* 117* 109* QUANTITY NOT SUFFICIENT, UNABLE TO PERFORM TEST 112*  BUN  --  20   < > 24* 19 18 QUANTITY NOT SUFFICIENT, UNABLE TO PERFORM TEST 20  CREATININE  --  1.03   < > 1.14 1.22 1.12 QUANTITY NOT SUFFICIENT, UNABLE TO PERFORM TEST 1.09  CALCIUM  --  8.7*   < > 8.8* 8.8* 8.6* QUANTITY NOT SUFFICIENT, UNABLE TO PERFORM TEST 8.7*  MG 1.9 1.8  --  1.9  --  1.9  --  1.8   < > = values in this interval not displayed.   Liver Function Tests: Recent Labs  Lab 07/10/19 0246 07/11/19 0841 07/12/19 0539 07/13/19 0230 07/13/19 0622  AST 30 24 21  QUANTITY NOT SUFFICIENT, UNABLE TO PERFORM TEST 20  ALT 14 14 12  QUANTITY NOT SUFFICIENT, UNABLE TO PERFORM TEST 12  ALKPHOS 39 36* 39 QUANTITY NOT SUFFICIENT, UNABLE TO PERFORM TEST 41  BILITOT 0.9 0.9 0.7 QUANTITY NOT SUFFICIENT, UNABLE TO PERFORM TEST 0.8  PROT 5.8* 5.9* 5.7* QUANTITY NOT SUFFICIENT, UNABLE TO  PERFORM TEST 6.0*  ALBUMIN 2.8* 3.0* 2.9* QUANTITY NOT SUFFICIENT, UNABLE TO PERFORM TEST 3.1*   No results for input(s): LIPASE, AMYLASE in the last 168 hours. No results for input(s): AMMONIA in the last 168 hours. CBC: Recent Labs  Lab 07/09/19 0241 07/10/19 0246 07/11/19 0841 07/12/19 0539 07/13/19 0230  WBC 7.0 6.8 6.4 6.4 8.0  NEUTROABS  --  4.9 4.9 4.4 5.6  HGB 14.8 15.2 15.2 14.3 16.0  HCT 44.7 46.0 45.5 43.3 47.3  MCV 93.3 94.1 93.6 93.7 92.2  PLT 184 197 196 198 228   Cardiac Enzymes: No results for input(s): CKTOTAL, CKMB, CKMBINDEX, TROPONINI in the last 168 hours. BNP: Invalid input(s): POCBNP CBG: Recent Labs  Lab 07/08/19 2226 07/11/19 2330 07/12/19 0356  GLUCAP 107* 106* 104*   D-Dimer No results for input(s): DDIMER in the last 72 hours. Hgb A1c No results for input(s): HGBA1C in the last 72 hours. Lipid Profile No results for input(s): CHOL, HDL, LDLCALC, TRIG, CHOLHDL, LDLDIRECT in the last 72 hours. Thyroid function studies No results  for input(s): TSH, T4TOTAL, T3FREE, THYROIDAB in the last 72 hours.  Invalid input(s): FREET3 Anemia work up No results for input(s): VITAMINB12, FOLATE, FERRITIN, TIBC, IRON, RETICCTPCT in the last 72 hours. Urinalysis    Component Value Date/Time   COLORURINE YELLOW 07/04/2019 2332   APPEARANCEUR CLEAR 07/04/2019 2332   LABSPEC 1.014 07/04/2019 2332   PHURINE 6.0 07/04/2019 2332   GLUCOSEU 50 (A) 07/04/2019 2332   HGBUR SMALL (A) 07/04/2019 2332   BILIRUBINUR NEGATIVE 07/04/2019 2332   KETONESUR 5 (A) 07/04/2019 2332   PROTEINUR NEGATIVE 07/04/2019 2332   UROBILINOGEN 1.0 08/25/2008 0326   NITRITE NEGATIVE 07/04/2019 2332   LEUKOCYTESUR NEGATIVE 07/04/2019 2332   Sepsis Labs Invalid input(s): PROCALCITONIN,  WBC,  LACTICIDVEN Microbiology Recent Results (from the past 240 hour(s))  Culture, blood (routine x 2)     Status: None (Preliminary result)   Collection Time: 07/12/19  3:21 PM   Specimen:  BLOOD  Result Value Ref Range Status   Specimen Description BLOOD LEFT HAND  Final   Special Requests   Final    BOTTLES DRAWN AEROBIC ONLY Blood Culture results may not be optimal due to an excessive volume of blood received in culture bottles   Culture   Final    NO GROWTH 3 DAYS Performed at [facility], [place] [address] [place], [place] [address]    Report Status PENDING  Incomplete  Culture, blood (routine x 2)     Status: None (Preliminary result)   Collection Time: 07/12/19  3:25 PM   Specimen: BLOOD  Result Value Ref Range Status   Specimen Description BLOOD LEFT HAND  Final   Special Requests   Final    BOTTLES DRAWN AEROBIC ONLY Blood Culture adequate volume   Culture   Final    NO GROWTH 3 DAYS Performed at [facility], [place] [address], [place], [place] [address]    Report Status PENDING  Incomplete  SARS CORONAVIRUS 2 (TAT 6-24 HRS) Nasopharyngeal Nasopharyngeal Swab     Status: None   Collection Time: 07/14/19  2:17 PM   Specimen: Nasopharyngeal Swab  Result Value Ref Range Status   SARS Coronavirus 2 NEGATIVE NEGATIVE Final    Comment: (NOTE) SARS-CoV-2 target nucleic acids are NOT DETECTED. The SARS-CoV-2 RNA is generally detectable in upper and lower respiratory specimens during the acute phase of infection. Negative results do not preclude SARS-CoV-2 infection, do not rule out co-infections with other pathogens, and should not be used as the sole basis for treatment or other patient management decisions. Negative results must be combined with clinical observations, patient history, and epidemiological information. The expected result is Negative. Fact Sheet for Patients: SugarRoll.be Fact Sheet for Healthcare Providers: https://www.woods-mathews.com/ This test is not yet approved or cleared by the [place] FDA and  has been authorized for detection and/or diagnosis of SARS-CoV-2 by FDA under an  Emergency Use Authorization (EUA). This EUA will remain  in effect (meaning this test can be used) for the duration of the COVID-19 declaration under Section 56 4(b)(1) of the Act, 21 U.S.C. section 360bbb-3(b)(1), unless the authorization is terminated or revoked sooner. Performed at [facility], [place] [address] [place], [place] [address]      Time coordinating discharge: 35 minutes  SIGNED:   Jonnie Finner, DO  [facility] 07/15/2019, 11:17 AM   If 7PM-7AM, please contact night-coverage [email or web address]

## 2019-07-15 NOTE — TOC Transition Note (Signed)
Transition of Care [facility]) - CM/SW Discharge Note   Patient Details  Name: DEMTRIUS ROUNDS MRN: [id number] Date of Birth: 1941-03-22  Transition of Care [facility]) CM/SW Contact:  Benard Halsted, LCSW Phone Number: 07/15/2019, 11:36 AM   Clinical Narrative:    Patient will DC to: [facility] Anticipated DC date: 07/15/19 Family notified: Daughter, Advertising copywriter by: Domenica Reamer (after room is cleaned)   Per MD patient ready for DC to [facility]. RN, patient, patient's family, and facility notified of DC. Discharge Summary and FL2 sent to facility. RN to call report prior to discharge [phone] Room 109). DC packet on chart. Ambulance transport requested for patient.   CSW will sign off for now as social work intervention is no longer needed. Please consult [place] again if new needs arise.  Cedric Fishman, LCSW Clinical Social Worker [phone]     Final next level of care: Skilled Nursing Facility Barriers to Discharge: No Barriers Identified   Patient Goals and CMS Choice Patient states their goals for this hospitalization and ongoing recovery are:: Rehab CMS Medicare.gov Compare Post Acute Care list provided to:: Patient Represenative (must comment)(Stephanie) Choice offered to / list presented to : Adult Children  Discharge Placement   Existing PASRR number confirmed : 07/15/19          Patient [facility] bed at: [facility] Patient to be transferred to facility by: [place] Name of family member notified: [name], daughter Patient and family notified of of transfer: 07/15/19  Discharge Plan and Services                                     Social Determinants of Health (SDOH) Interventions     Readmission Risk Interventions No flowsheet data found.

## 2019-07-15 NOTE — Progress Notes (Signed)
Pt has received insurance authorization for SNF rehab.   Auth ref #: [id number] 07/15/2019--07/17/2019 Nelly Laurence is the contact

## 2019-07-15 NOTE — Progress Notes (Signed)
  Speech Language Pathology Treatment: Dysphagia  Patient Details Name: Jeremiah [name] MRN: [id number] DOB: [birth year] Today's Date: 07/15/2019 Time: 3794-4461 SLP Time Calculation (min) (ACUTE ONLY): 24 min  Assessment / Plan / Recommendation Clinical Impression  Pt able to self feed once po was initiated with hand over hand assistance, he consumed 3/4 of grits and 3 boluses of sausage as well as 5 boluses of thin liquids, delayed throat clearing/cough noted after solid consumption x1 but did not recurr, Delayed initiation noted and concern for adequate po intake is present but current diet appears appropriate for him.  SLP educated pt to findings/recommendations and he was in approval.   Recommend continue diet and SLP to sign off.  Note per chart, pt with plateu in improvement.  Follow up at next venue of care may be beneficial to address cognitive linguistic difficulties.  Pt provided with written strategies for dysphagia compensation/    HPI HPI: 79 y.o. male, medical history including but not limited to hypertension, Dm2, prostate cancer s/p XRT, OSA on CPAP, NPH s/p VP shunt, presenting with altered mental status associated with fever, tachycardia, hypoxia and tachypnea after being found on the floor. Per chart son reports patient at baseline is quite "sharp". Per MD note suspicous pt may "have had another aspiration event."  had episode last night of unresponsiveness with hypoxia. Noted to be diaphoretic, foaming at his mouth-briefly placed on NRB mask and then repositioned-was quickly able to taper O2 down to 4 L returning sats to 100%.  CXR Progressive left basilar consolidation with likely small left pleural effusion. This could be related to atelectasis given previous CT findings. 2. Chronic elevation right hemidiaphragm with compressive atelectasis right base.      SLP Plan  All goals met(Palliative Care consult recommended.)       Recommendations  Diet recommendations: Dysphagia 2  (fine chop);Thin liquid Liquids provided via: Straw;Cup Supervision: Intermittent supervision to cue for compensatory strategies Compensations: Minimize environmental distractions;Slow rate;Small sips/bites Postural Changes and/or Swallow Maneuvers: Seated upright 90 degrees;Upright 30-60 min after meal                Oral Care Recommendations: Oral care BID Follow up Recommendations: Skilled Nursing facility SLP Visit Diagnosis: Dysphagia, unspecified (R13.10) Plan: All goals met(Palliative Care consult recommended.)       GO                Macario Golds 07/15/2019, 9:53 AM   Kathleen Lime, MS [place] Office [phone]

## 2019-07-16 DIAGNOSIS — I10 Essential (primary) hypertension: Secondary | ICD-10-CM | POA: Diagnosis not present

## 2019-07-16 DIAGNOSIS — J9621 Acute and chronic respiratory failure with hypoxia: Secondary | ICD-10-CM | POA: Diagnosis not present

## 2019-07-16 DIAGNOSIS — E119 Type 2 diabetes mellitus without complications: Secondary | ICD-10-CM | POA: Diagnosis not present

## 2019-07-16 DIAGNOSIS — R531 Weakness: Secondary | ICD-10-CM | POA: Diagnosis not present

## 2019-07-17 DIAGNOSIS — R451 Restlessness and agitation: Secondary | ICD-10-CM | POA: Diagnosis not present

## 2019-07-17 DIAGNOSIS — Z993 Dependence on wheelchair: Secondary | ICD-10-CM | POA: Diagnosis not present

## 2019-07-17 DIAGNOSIS — Z820 Family history of epilepsy and other diseases of the nervous system: Secondary | ICD-10-CM | POA: Diagnosis not present

## 2019-07-17 DIAGNOSIS — E119 Type 2 diabetes mellitus without complications: Secondary | ICD-10-CM | POA: Diagnosis not present

## 2019-07-17 DIAGNOSIS — S50312A Abrasion of left elbow, initial encounter: Secondary | ICD-10-CM | POA: Diagnosis not present

## 2019-07-17 DIAGNOSIS — H9190 Unspecified hearing loss, unspecified ear: Secondary | ICD-10-CM | POA: Diagnosis not present

## 2019-07-17 DIAGNOSIS — S0990XA Unspecified injury of head, initial encounter: Secondary | ICD-10-CM | POA: Diagnosis not present

## 2019-07-17 DIAGNOSIS — J9600 Acute respiratory failure, unspecified whether with hypoxia or hypercapnia: Secondary | ICD-10-CM | POA: Diagnosis not present

## 2019-07-17 DIAGNOSIS — Z982 Presence of cerebrospinal fluid drainage device: Secondary | ICD-10-CM | POA: Diagnosis not present

## 2019-07-17 DIAGNOSIS — G912 (Idiopathic) normal pressure hydrocephalus: Secondary | ICD-10-CM | POA: Diagnosis not present

## 2019-07-17 DIAGNOSIS — Z8249 Family history of ischemic heart disease and other diseases of the circulatory system: Secondary | ICD-10-CM | POA: Diagnosis not present

## 2019-07-17 DIAGNOSIS — R0902 Hypoxemia: Secondary | ICD-10-CM | POA: Diagnosis not present

## 2019-07-17 DIAGNOSIS — R109 Unspecified abdominal pain: Secondary | ICD-10-CM | POA: Diagnosis not present

## 2019-07-17 DIAGNOSIS — S299XXA Unspecified injury of thorax, initial encounter: Secondary | ICD-10-CM | POA: Diagnosis not present

## 2019-07-17 DIAGNOSIS — Z8546 Personal history of malignant neoplasm of prostate: Secondary | ICD-10-CM | POA: Diagnosis not present

## 2019-07-17 DIAGNOSIS — G4733 Obstructive sleep apnea (adult) (pediatric): Secondary | ICD-10-CM | POA: Diagnosis not present

## 2019-07-17 DIAGNOSIS — Z9181 History of falling: Secondary | ICD-10-CM | POA: Diagnosis not present

## 2019-07-17 DIAGNOSIS — S60410A Abrasion of right index finger, initial encounter: Secondary | ICD-10-CM | POA: Diagnosis not present

## 2019-07-17 DIAGNOSIS — S0081XA Abrasion of other part of head, initial encounter: Secondary | ICD-10-CM | POA: Diagnosis not present

## 2019-07-17 DIAGNOSIS — N179 Acute kidney failure, unspecified: Secondary | ICD-10-CM | POA: Diagnosis not present

## 2019-07-17 DIAGNOSIS — I10 Essential (primary) hypertension: Secondary | ICD-10-CM | POA: Diagnosis not present

## 2019-07-17 DIAGNOSIS — Z87440 Personal history of urinary (tract) infections: Secondary | ICD-10-CM | POA: Diagnosis not present

## 2019-07-17 DIAGNOSIS — S199XXA Unspecified injury of neck, initial encounter: Secondary | ICD-10-CM | POA: Diagnosis not present

## 2019-07-17 DIAGNOSIS — Z7982 Long term (current) use of aspirin: Secondary | ICD-10-CM | POA: Diagnosis not present

## 2019-07-17 DIAGNOSIS — K5732 Diverticulitis of large intestine without perforation or abscess without bleeding: Secondary | ICD-10-CM | POA: Diagnosis not present

## 2019-07-17 DIAGNOSIS — T8501XA Breakdown (mechanical) of ventricular intracranial (communicating) shunt, initial encounter: Secondary | ICD-10-CM | POA: Diagnosis not present

## 2019-07-17 DIAGNOSIS — R000 Tachycardia, unspecified: Secondary | ICD-10-CM | POA: Diagnosis not present

## 2019-07-17 DIAGNOSIS — G9340 Encephalopathy, unspecified: Secondary | ICD-10-CM | POA: Diagnosis not present

## 2019-07-17 DIAGNOSIS — J189 Pneumonia, unspecified organism: Secondary | ICD-10-CM | POA: Diagnosis not present

## 2019-07-17 DIAGNOSIS — Z79899 Other long term (current) drug therapy: Secondary | ICD-10-CM | POA: Diagnosis not present

## 2019-07-17 LAB — CULTURE, BLOOD (ROUTINE X 2)
Culture: NO GROWTH
Culture: NO GROWTH
Special Requests: ADEQUATE

## 2019-07-18 DIAGNOSIS — G008 Other bacterial meningitis: Secondary | ICD-10-CM | POA: Diagnosis not present

## 2019-07-18 DIAGNOSIS — M2550 Pain in unspecified joint: Secondary | ICD-10-CM | POA: Diagnosis not present

## 2019-07-18 DIAGNOSIS — R835 Abnormal microbiological findings in cerebrospinal fluid: Secondary | ICD-10-CM | POA: Diagnosis not present

## 2019-07-18 DIAGNOSIS — B9689 Other specified bacterial agents as the cause of diseases classified elsewhere: Secondary | ICD-10-CM | POA: Diagnosis not present

## 2019-07-18 DIAGNOSIS — T8509XA Other mechanical complication of ventricular intracranial (communicating) shunt, initial encounter: Secondary | ICD-10-CM | POA: Diagnosis not present

## 2019-07-18 DIAGNOSIS — G9340 Encephalopathy, unspecified: Secondary | ICD-10-CM | POA: Diagnosis not present

## 2019-07-18 DIAGNOSIS — Z1611 Resistance to penicillins: Secondary | ICD-10-CM | POA: Diagnosis not present

## 2019-07-18 DIAGNOSIS — K5900 Constipation, unspecified: Secondary | ICD-10-CM | POA: Diagnosis not present

## 2019-07-18 DIAGNOSIS — G94 Other disorders of brain in diseases classified elsewhere: Secondary | ICD-10-CM | POA: Diagnosis not present

## 2019-07-18 DIAGNOSIS — I129 Hypertensive chronic kidney disease with stage 1 through stage 4 chronic kidney disease, or unspecified chronic kidney disease: Secondary | ICD-10-CM | POA: Diagnosis not present

## 2019-07-18 DIAGNOSIS — N400 Enlarged prostate without lower urinary tract symptoms: Secondary | ICD-10-CM | POA: Diagnosis not present

## 2019-07-18 DIAGNOSIS — T8502XD Displacement of ventricular intracranial (communicating) shunt, subsequent encounter: Secondary | ICD-10-CM | POA: Diagnosis not present

## 2019-07-18 DIAGNOSIS — J189 Pneumonia, unspecified organism: Secondary | ICD-10-CM | POA: Diagnosis not present

## 2019-07-18 DIAGNOSIS — R296 Repeated falls: Secondary | ICD-10-CM | POA: Diagnosis not present

## 2019-07-18 DIAGNOSIS — Z7401 Bed confinement status: Secondary | ICD-10-CM | POA: Diagnosis not present

## 2019-07-18 DIAGNOSIS — R531 Weakness: Secondary | ICD-10-CM | POA: Diagnosis not present

## 2019-07-18 DIAGNOSIS — R0902 Hypoxemia: Secondary | ICD-10-CM | POA: Diagnosis not present

## 2019-07-18 DIAGNOSIS — R410 Disorientation, unspecified: Secondary | ICD-10-CM | POA: Diagnosis not present

## 2019-07-18 DIAGNOSIS — K631 Perforation of intestine (nontraumatic): Secondary | ICD-10-CM | POA: Diagnosis not present

## 2019-07-18 DIAGNOSIS — E1159 Type 2 diabetes mellitus with other circulatory complications: Secondary | ICD-10-CM | POA: Diagnosis not present

## 2019-07-18 DIAGNOSIS — S0081XA Abrasion of other part of head, initial encounter: Secondary | ICD-10-CM | POA: Diagnosis not present

## 2019-07-18 DIAGNOSIS — I2510 Atherosclerotic heart disease of native coronary artery without angina pectoris: Secondary | ICD-10-CM | POA: Diagnosis not present

## 2019-07-18 DIAGNOSIS — I679 Cerebrovascular disease, unspecified: Secondary | ICD-10-CM | POA: Diagnosis not present

## 2019-07-18 DIAGNOSIS — K219 Gastro-esophageal reflux disease without esophagitis: Secondary | ICD-10-CM | POA: Diagnosis not present

## 2019-07-18 DIAGNOSIS — C61 Malignant neoplasm of prostate: Secondary | ICD-10-CM | POA: Diagnosis not present

## 2019-07-18 DIAGNOSIS — T85730A Infection and inflammatory reaction due to ventricular intracranial (communicating) shunt, initial encounter: Secondary | ICD-10-CM | POA: Diagnosis not present

## 2019-07-18 DIAGNOSIS — K5790 Diverticulosis of intestine, part unspecified, without perforation or abscess without bleeding: Secondary | ICD-10-CM | POA: Diagnosis not present

## 2019-07-18 DIAGNOSIS — Z4541 Encounter for adjustment and management of cerebrospinal fluid drainage device: Secondary | ICD-10-CM | POA: Diagnosis not present

## 2019-07-18 DIAGNOSIS — R509 Fever, unspecified: Secondary | ICD-10-CM | POA: Diagnosis not present

## 2019-07-18 DIAGNOSIS — T85698D Other mechanical complication of other specified internal prosthetic devices, implants and grafts, subsequent encounter: Secondary | ICD-10-CM | POA: Diagnosis not present

## 2019-07-18 DIAGNOSIS — B9620 Unspecified Escherichia coli [E. coli] as the cause of diseases classified elsewhere: Secondary | ICD-10-CM | POA: Diagnosis not present

## 2019-07-18 DIAGNOSIS — K8020 Calculus of gallbladder without cholecystitis without obstruction: Secondary | ICD-10-CM | POA: Diagnosis not present

## 2019-07-18 DIAGNOSIS — G4733 Obstructive sleep apnea (adult) (pediatric): Secondary | ICD-10-CM | POA: Diagnosis not present

## 2019-07-18 DIAGNOSIS — T85890A Other specified complication of nervous system prosthetic devices, implants and grafts, initial encounter: Secondary | ICD-10-CM | POA: Diagnosis not present

## 2019-07-18 DIAGNOSIS — G912 (Idiopathic) normal pressure hydrocephalus: Secondary | ICD-10-CM | POA: Diagnosis not present

## 2019-07-18 DIAGNOSIS — R4182 Altered mental status, unspecified: Secondary | ICD-10-CM | POA: Diagnosis not present

## 2019-07-18 DIAGNOSIS — H9190 Unspecified hearing loss, unspecified ear: Secondary | ICD-10-CM | POA: Diagnosis not present

## 2019-07-18 DIAGNOSIS — K5730 Diverticulosis of large intestine without perforation or abscess without bleeding: Secondary | ICD-10-CM | POA: Diagnosis not present

## 2019-07-18 DIAGNOSIS — J9601 Acute respiratory failure with hypoxia: Secondary | ICD-10-CM | POA: Diagnosis not present

## 2019-07-18 DIAGNOSIS — N189 Chronic kidney disease, unspecified: Secondary | ICD-10-CM | POA: Diagnosis not present

## 2019-07-18 DIAGNOSIS — A419 Sepsis, unspecified organism: Secondary | ICD-10-CM | POA: Diagnosis not present

## 2019-07-18 DIAGNOSIS — G910 Communicating hydrocephalus: Secondary | ICD-10-CM | POA: Diagnosis not present

## 2019-07-18 DIAGNOSIS — E1122 Type 2 diabetes mellitus with diabetic chronic kidney disease: Secondary | ICD-10-CM | POA: Diagnosis not present

## 2019-07-18 DIAGNOSIS — E119 Type 2 diabetes mellitus without complications: Secondary | ICD-10-CM | POA: Diagnosis not present

## 2019-07-18 DIAGNOSIS — Z982 Presence of cerebrospinal fluid drainage device: Secondary | ICD-10-CM | POA: Diagnosis not present

## 2019-07-26 DIAGNOSIS — Z982 Presence of cerebrospinal fluid drainage device: Secondary | ICD-10-CM | POA: Diagnosis not present

## 2019-07-26 DIAGNOSIS — I10 Essential (primary) hypertension: Secondary | ICD-10-CM | POA: Diagnosis not present

## 2019-07-26 DIAGNOSIS — J9601 Acute respiratory failure with hypoxia: Secondary | ICD-10-CM | POA: Diagnosis not present

## 2019-07-26 DIAGNOSIS — G912 (Idiopathic) normal pressure hydrocephalus: Secondary | ICD-10-CM | POA: Diagnosis not present

## 2019-07-26 DIAGNOSIS — J189 Pneumonia, unspecified organism: Secondary | ICD-10-CM | POA: Diagnosis not present

## 2019-07-26 DIAGNOSIS — T85698D Other mechanical complication of other specified internal prosthetic devices, implants and grafts, subsequent encounter: Secondary | ICD-10-CM | POA: Diagnosis not present

## 2019-07-26 DIAGNOSIS — Z7401 Bed confinement status: Secondary | ICD-10-CM | POA: Diagnosis not present

## 2019-07-26 DIAGNOSIS — E119 Type 2 diabetes mellitus without complications: Secondary | ICD-10-CM | POA: Diagnosis not present

## 2019-07-26 DIAGNOSIS — G4733 Obstructive sleep apnea (adult) (pediatric): Secondary | ICD-10-CM | POA: Diagnosis not present

## 2019-07-26 DIAGNOSIS — G9340 Encephalopathy, unspecified: Secondary | ICD-10-CM | POA: Diagnosis not present

## 2019-07-26 DIAGNOSIS — I679 Cerebrovascular disease, unspecified: Secondary | ICD-10-CM | POA: Diagnosis not present

## 2019-07-26 DIAGNOSIS — A419 Sepsis, unspecified organism: Secondary | ICD-10-CM | POA: Diagnosis not present

## 2019-07-26 DIAGNOSIS — E1159 Type 2 diabetes mellitus with other circulatory complications: Secondary | ICD-10-CM | POA: Diagnosis not present

## 2019-07-26 DIAGNOSIS — R0902 Hypoxemia: Secondary | ICD-10-CM | POA: Diagnosis not present

## 2019-07-26 DIAGNOSIS — R531 Weakness: Secondary | ICD-10-CM | POA: Diagnosis not present

## 2019-07-26 DIAGNOSIS — Z452 Encounter for adjustment and management of vascular access device: Secondary | ICD-10-CM | POA: Diagnosis not present

## 2019-07-26 DIAGNOSIS — M2550 Pain in unspecified joint: Secondary | ICD-10-CM | POA: Diagnosis not present

## 2019-07-26 DIAGNOSIS — E1122 Type 2 diabetes mellitus with diabetic chronic kidney disease: Secondary | ICD-10-CM | POA: Diagnosis not present

## 2019-07-28 DIAGNOSIS — I10 Essential (primary) hypertension: Secondary | ICD-10-CM | POA: Diagnosis not present

## 2019-07-28 DIAGNOSIS — G912 (Idiopathic) normal pressure hydrocephalus: Secondary | ICD-10-CM | POA: Diagnosis not present

## 2019-07-28 DIAGNOSIS — R531 Weakness: Secondary | ICD-10-CM | POA: Diagnosis not present

## 2019-07-28 DIAGNOSIS — E119 Type 2 diabetes mellitus without complications: Secondary | ICD-10-CM | POA: Diagnosis not present

## 2019-08-12 DIAGNOSIS — Z982 Presence of cerebrospinal fluid drainage device: Secondary | ICD-10-CM | POA: Diagnosis not present

## 2019-08-12 DIAGNOSIS — G912 (Idiopathic) normal pressure hydrocephalus: Secondary | ICD-10-CM | POA: Diagnosis present

## 2019-08-20 DIAGNOSIS — G4733 Obstructive sleep apnea (adult) (pediatric): Secondary | ICD-10-CM | POA: Diagnosis not present

## 2019-08-20 DIAGNOSIS — H9193 Unspecified hearing loss, bilateral: Secondary | ICD-10-CM | POA: Diagnosis not present

## 2019-08-20 DIAGNOSIS — Z8546 Personal history of malignant neoplasm of prostate: Secondary | ICD-10-CM | POA: Diagnosis not present

## 2019-08-20 DIAGNOSIS — T8501XA Breakdown (mechanical) of ventricular intracranial (communicating) shunt, initial encounter: Secondary | ICD-10-CM | POA: Diagnosis not present

## 2019-08-20 DIAGNOSIS — G912 (Idiopathic) normal pressure hydrocephalus: Secondary | ICD-10-CM | POA: Diagnosis not present

## 2019-08-20 DIAGNOSIS — J90 Pleural effusion, not elsewhere classified: Secondary | ICD-10-CM | POA: Diagnosis not present

## 2019-08-20 DIAGNOSIS — I10 Essential (primary) hypertension: Secondary | ICD-10-CM | POA: Diagnosis not present

## 2019-08-20 DIAGNOSIS — T8502XA Displacement of ventricular intracranial (communicating) shunt, initial encounter: Secondary | ICD-10-CM | POA: Diagnosis not present

## 2019-08-20 DIAGNOSIS — G910 Communicating hydrocephalus: Secondary | ICD-10-CM | POA: Diagnosis not present

## 2019-08-20 DIAGNOSIS — Z20822 Contact with and (suspected) exposure to covid-19: Secondary | ICD-10-CM | POA: Diagnosis not present

## 2019-08-20 DIAGNOSIS — N3941 Urge incontinence: Secondary | ICD-10-CM | POA: Diagnosis not present

## 2019-08-20 DIAGNOSIS — T85610A Breakdown (mechanical) of epidural and subdural infusion catheter, initial encounter: Secondary | ICD-10-CM | POA: Diagnosis not present

## 2019-08-20 DIAGNOSIS — J9811 Atelectasis: Secondary | ICD-10-CM | POA: Diagnosis not present

## 2019-08-20 DIAGNOSIS — Z982 Presence of cerebrospinal fluid drainage device: Secondary | ICD-10-CM | POA: Diagnosis not present

## 2019-08-27 DIAGNOSIS — I10 Essential (primary) hypertension: Secondary | ICD-10-CM | POA: Diagnosis not present

## 2019-08-27 DIAGNOSIS — K5790 Diverticulosis of intestine, part unspecified, without perforation or abscess without bleeding: Secondary | ICD-10-CM | POA: Diagnosis not present

## 2019-08-27 DIAGNOSIS — K269 Duodenal ulcer, unspecified as acute or chronic, without hemorrhage or perforation: Secondary | ICD-10-CM | POA: Diagnosis not present

## 2019-08-27 DIAGNOSIS — Z7982 Long term (current) use of aspirin: Secondary | ICD-10-CM | POA: Diagnosis not present

## 2019-08-27 DIAGNOSIS — T85730D Infection and inflammatory reaction due to ventricular intracranial (communicating) shunt, subsequent encounter: Secondary | ICD-10-CM | POA: Diagnosis not present

## 2019-08-27 DIAGNOSIS — R000 Tachycardia, unspecified: Secondary | ICD-10-CM | POA: Diagnosis not present

## 2019-08-27 DIAGNOSIS — Z9181 History of falling: Secondary | ICD-10-CM | POA: Diagnosis not present

## 2019-08-27 DIAGNOSIS — Z8546 Personal history of malignant neoplasm of prostate: Secondary | ICD-10-CM | POA: Diagnosis not present

## 2019-08-27 DIAGNOSIS — G912 (Idiopathic) normal pressure hydrocephalus: Secondary | ICD-10-CM | POA: Diagnosis not present

## 2019-08-27 DIAGNOSIS — H6121 Impacted cerumen, right ear: Secondary | ICD-10-CM | POA: Diagnosis not present

## 2019-09-04 DIAGNOSIS — Z4802 Encounter for removal of sutures: Secondary | ICD-10-CM | POA: Diagnosis not present

## 2019-09-08 DIAGNOSIS — I2510 Atherosclerotic heart disease of native coronary artery without angina pectoris: Secondary | ICD-10-CM | POA: Diagnosis not present

## 2019-09-08 DIAGNOSIS — I1310 Hypertensive heart and chronic kidney disease without heart failure, with stage 1 through stage 4 chronic kidney disease, or unspecified chronic kidney disease: Secondary | ICD-10-CM | POA: Diagnosis not present

## 2019-09-08 DIAGNOSIS — N182 Chronic kidney disease, stage 2 (mild): Secondary | ICD-10-CM | POA: Diagnosis not present

## 2019-09-08 DIAGNOSIS — R5383 Other fatigue: Secondary | ICD-10-CM | POA: Diagnosis not present

## 2019-09-22 DIAGNOSIS — G912 (Idiopathic) normal pressure hydrocephalus: Secondary | ICD-10-CM | POA: Diagnosis not present

## 2019-09-22 DIAGNOSIS — Z982 Presence of cerebrospinal fluid drainage device: Secondary | ICD-10-CM | POA: Diagnosis not present

## 2019-09-26 DIAGNOSIS — Z7982 Long term (current) use of aspirin: Secondary | ICD-10-CM | POA: Diagnosis not present

## 2019-09-26 DIAGNOSIS — Z9181 History of falling: Secondary | ICD-10-CM | POA: Diagnosis not present

## 2019-09-26 DIAGNOSIS — R000 Tachycardia, unspecified: Secondary | ICD-10-CM | POA: Diagnosis not present

## 2019-09-26 DIAGNOSIS — H6121 Impacted cerumen, right ear: Secondary | ICD-10-CM | POA: Diagnosis not present

## 2019-09-26 DIAGNOSIS — K269 Duodenal ulcer, unspecified as acute or chronic, without hemorrhage or perforation: Secondary | ICD-10-CM | POA: Diagnosis not present

## 2019-09-26 DIAGNOSIS — T85730D Infection and inflammatory reaction due to ventricular intracranial (communicating) shunt, subsequent encounter: Secondary | ICD-10-CM | POA: Diagnosis not present

## 2019-09-26 DIAGNOSIS — Z8546 Personal history of malignant neoplasm of prostate: Secondary | ICD-10-CM | POA: Diagnosis not present

## 2019-09-26 DIAGNOSIS — G912 (Idiopathic) normal pressure hydrocephalus: Secondary | ICD-10-CM | POA: Diagnosis not present

## 2019-09-26 DIAGNOSIS — I10 Essential (primary) hypertension: Secondary | ICD-10-CM | POA: Diagnosis not present

## 2019-09-26 DIAGNOSIS — K5790 Diverticulosis of intestine, part unspecified, without perforation or abscess without bleeding: Secondary | ICD-10-CM | POA: Diagnosis not present

## 2019-10-15 DIAGNOSIS — D692 Other nonthrombocytopenic purpura: Secondary | ICD-10-CM | POA: Diagnosis not present

## 2019-10-15 DIAGNOSIS — Z85828 Personal history of other malignant neoplasm of skin: Secondary | ICD-10-CM | POA: Diagnosis not present

## 2019-10-15 DIAGNOSIS — L821 Other seborrheic keratosis: Secondary | ICD-10-CM | POA: Diagnosis not present

## 2019-10-15 DIAGNOSIS — L570 Actinic keratosis: Secondary | ICD-10-CM | POA: Diagnosis not present

## 2019-10-31 DIAGNOSIS — C61 Malignant neoplasm of prostate: Secondary | ICD-10-CM | POA: Diagnosis not present

## 2019-11-07 DIAGNOSIS — C61 Malignant neoplasm of prostate: Secondary | ICD-10-CM | POA: Diagnosis not present

## 2019-11-07 DIAGNOSIS — N401 Enlarged prostate with lower urinary tract symptoms: Secondary | ICD-10-CM | POA: Diagnosis not present

## 2019-11-07 DIAGNOSIS — N3943 Post-void dribbling: Secondary | ICD-10-CM | POA: Diagnosis not present

## 2019-11-19 ENCOUNTER — Other Ambulatory Visit: Payer: Self-pay | Admitting: *Deleted

## 2019-11-19 DIAGNOSIS — I714 Abdominal aortic aneurysm, without rupture, unspecified: Secondary | ICD-10-CM

## 2019-11-25 DIAGNOSIS — H2513 Age-related nuclear cataract, bilateral: Secondary | ICD-10-CM | POA: Diagnosis not present

## 2019-11-25 DIAGNOSIS — H353111 Nonexudative age-related macular degeneration, right eye, early dry stage: Secondary | ICD-10-CM | POA: Diagnosis not present

## 2019-11-25 DIAGNOSIS — E119 Type 2 diabetes mellitus without complications: Secondary | ICD-10-CM | POA: Diagnosis not present

## 2019-12-02 ENCOUNTER — Ambulatory Visit (INDEPENDENT_AMBULATORY_CARE_PROVIDER_SITE_OTHER): Payer: Medicare Other | Admitting: Vascular Surgery

## 2019-12-02 ENCOUNTER — Other Ambulatory Visit: Payer: Self-pay

## 2019-12-02 ENCOUNTER — Encounter: Payer: Self-pay | Admitting: Vascular Surgery

## 2019-12-02 ENCOUNTER — Ambulatory Visit (HOSPITAL_COMMUNITY)
Admission: RE | Admit: 2019-12-02 | Discharge: 2019-12-02 | Disposition: A | Discharge status: Home / Self Care | Payer: Medicare Other | Source: Ambulatory Visit | Attending: Vascular Surgery | Admitting: Vascular Surgery

## 2019-12-02 VITALS — BP 140/79 | HR 88 | Temp 97.7°F | Resp 16 | Ht 69.0 in | Wt 177.0 lb

## 2019-12-02 DIAGNOSIS — I714 Abdominal aortic aneurysm, without rupture, unspecified: Secondary | ICD-10-CM

## 2019-12-02 NOTE — Progress Notes (Signed)
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 male, with history of hydrocephalus status post VP shunt, hypertension, hyperlipidemia, history of prostate cancer status post radiation therapy that presents for 1 year follow-up of a 3.8 cm AAA.  When he was evaluated last year largest AAA diameter was 4 cm.  Reports prolonged hospitalization earlier this year at [facility] for VP shunt infection that required brief stay in nursing facility.  He has since been discharged back home.  No abdominal pain at this time.  Past Medical History:  Diagnosis Date  . BPH (benign prostatic hyperplasia)   . Central retinal artery occlusion   . Diabetes mellitus without complication (HCC)    diet controlled  . Diverticulosis   . History of kidney stones   . Hyperlipidemia   . Hypertension   . OSA on CPAP    wears cpap  . Osteoarthritis   . 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 S/P radiation therapy 04/23/13 - 05/29/14   Prostate/seminal vesicles, external beam 4500 cGy in 25 sessions  . Seasonal allergies   . Skin cancer    scalp  . Wears glasses     Past Surgical History:  Procedure Laterality Date  . BACK SURGERY  2009  . COLONOSCOPY W/ BIOPSIES AND POLYPECTOMY    . [place]  . LEFT HEART CATH AND CORONARY ANGIOGRAPHY N/A 03/14/2019   Procedure: LEFT HEART CATH AND CORONARY ANGIOGRAPHY;  Surgeon: [name], MD;  Location: [place] [facility];  Service: Cardiovascular;  Laterality: N/A;  . PROSTATE BIOPSY  2013, 2014, 01/2014   Gleason 7  . RADIOACTIVE SEED IMPLANT N/A 07/01/2014   Procedure: RADIOACTIVE SEED IMPLANT;  Surgeon: [name], MD;  Location: [facility];  Service: Urology;  Laterality: N/A;  DR PORTABLE  . TONSILLECTOMY    . 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   Procedure: SHUNT  INSERTION VENTRICULAR-PERITONEAL;  Surgeon: [name], MD;  Location: [place];  Service: Neurosurgery;  Laterality: N/A;  SHUNT INSERTION VENTRICULAR-PERITONEAL    Family History  Problem Relation Age of Onset  . Heart attack Father 84  . Cancer Father        prostate  . Diabetes Father   . Cancer Paternal Uncle        prostate  . Dementia Mother 10  . Lung cancer Brother     SOCIAL HISTORY: Social History   Socioeconomic History  . Marital status: Married    Spouse name: Not on file  . Number of children: 2  . Years of education: Not on file  . Highest education level: Not on file  Occupational History  . Occupation: retired    Comment: all Naval architect  Tobacco Use  . Smoking status: Former Smoker    Types: Cigarettes    Quit date: 03/10/1961    Years since quitting: 58.7  . Smokeless tobacco: Never Used  Vaping Use  . Vaping Use: Never used  Substance and Sexual Activity  . Alcohol use: Yes    Alcohol/week: 1.0 standard drink    Types: 1 Glasses of wine per week    Comment: rare alcohol  . Drug use: No  . Sexual activity: Not on file  Other Topics Concern  . Not on file  Social History Narrative  Rare caffeine use    Social Determinants of Health   Financial Resource Strain:   . Difficulty of Paying Living Expenses:   Food Insecurity:   . Worried About Charity fundraiser in the Last Year:   . Arboriculturist in the Last Year:   Transportation Needs:   . Film/video editor (Medical):   [name] Kitchen Lack of Transportation (Non-Medical):   Physical Activity:   . Days of Exercise per Week:   . Minutes of Exercise per Session:   Stress:   . Feeling of Stress :   Social Connections:   . Frequency of Communication with Friends and Family:   . Frequency of Social Gatherings with Friends and Family:   . Attends Religious Services:   . Active Member of Clubs or Organizations:   . Attends Archivist Meetings:   [name] Kitchen Marital Status:   Intimate  Partner Violence:   . Fear of Current or Ex-Partner:   . Emotionally Abused:   [name] Kitchen Physically Abused:   . Sexually Abused:     No Known Allergies  Current Outpatient Medications  Medication Sig Dispense Refill  . acetaminophen (TYLENOL) 500 MG tablet Take 1,000 mg by mouth at bedtime.     . Amino Acids-Protein Hydrolys (FEEDING SUPPLEMENT, PRO-STAT SUGAR FREE 64,) LIQD Take 30 mLs by mouth daily. 887 mL 0  . amLODipine (NORVASC) 5 MG tablet Take 1 tablet (5 mg total) by mouth daily. 30 tablet 0  . Apoaequorin (PREVAGEN EXTRA STRENGTH) 20 MG CAPS Take 20-40 mg by mouth daily.     [name] Kitchen aspirin EC 81 MG tablet Take 81 mg by mouth daily.     . feeding supplement, ENSURE ENLIVE, (ENSURE ENLIVE) LIQD Take 237 mLs by mouth 2 (two) times daily between meals. 237 mL 12  . Melatonin 10 MG CAPS Take 10 mg by mouth at bedtime as needed (for sleep).     . Multiple Vitamin (MULTIVITAMIN WITH MINERALS) TABS tablet Take 1 tablet by mouth daily.    . Multiple Vitamins-Minerals (PRESERVISION AREDS 2 PO) Take 1 capsule by mouth 2 (two) times daily.     . pantoprazole (PROTONIX) 20 MG tablet Take 20 mg by mouth daily before breakfast.     . potassium chloride (K-DUR) 10 MEQ tablet Take 10 mEq by mouth 2 (two) times daily.   2  . senna-docusate (SENOKOT-S) 8.6-50 MG tablet Take 1 tablet by mouth daily.    . vitamin B-12 (CYANOCOBALAMIN) 250 MCG tablet Take 250 mcg by mouth daily.     No current facility-administered medications for this visit.    REVIEW OF SYSTEMS:  [X]  denotes positive finding, [ ]  denotes negative finding Cardiac  Comments:  Chest pain or chest pressure:    Shortness of breath upon exertion:    Short of breath when lying flat:    Irregular heart rhythm:        Vascular    Pain in calf, thigh, or hip brought on by ambulation:    Pain in feet at night that wakes you up from your sleep:     Blood clot in your veins:    Leg swelling:         Pulmonary    Oxygen at home:    Productive  cough:     Wheezing:         Neurologic    Sudden weakness in arms or legs:     Sudden numbness in arms or legs:  Sudden onset of difficulty speaking or slurred speech:    Temporary loss of vision in one eye:     Problems with dizziness:         Gastrointestinal    Blood in stool:     Vomited blood:         Genitourinary    Burning when urinating:     Blood in urine:        Psychiatric    Major depression:         Hematologic    Bleeding problems:    Problems with blood clotting too easily:        Skin    Rashes or ulcers:        Constitutional    Fever or chills:      PHYSICAL EXAM: There were no vitals filed for this visit.  GENERAL: The patient is a well-nourished male, in no acute distress. The vital signs are documented above. CARDIAC: There is a regular rate and rhythm.  VASCULAR:  2+ femoral pulse palpable bilaterally 2+ DP pulse palpable bilaterally PULMONARY: There is good air exchange bilaterally without wheezing or rales. ABDOMEN: Soft and non-tender with normal pitched bowel sounds.  No tenderness with deep palpation in midline MUSCULOSKELETAL: There are no major deformities or cyanosis. NEUROLOGIC: No focal weakness or paresthesias are detected. SKIN: There are no ulcers or rashes noted. PSYCHIATRIC: The patient has a normal affect.  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  AAA duplex 11/19/18 largest aortic measurement of 4 cm in the infrarenal segment.  AAA duplex today largest diameter 3.86 cm.  Assessment/Plan:  79 year old male who presents for 1 year surveillance of a 3.8 cm abdominal aortic aneurysm.  On repeat duplex today measures 3.86 cm with no significant growth.  He remains asymptomatic from an aneurysm standpoint.  We will plan to see him back in 1 year for AAA duplex.  Discussed that we typically offer repair greater than 5.5 cm in  men unless there is rapid growth in the aneurysm.    Marty Heck, MD [facility] Office: [place]

## 2019-12-23 DIAGNOSIS — E785 Hyperlipidemia, unspecified: Secondary | ICD-10-CM | POA: Diagnosis not present

## 2019-12-23 DIAGNOSIS — C4491 Basal cell carcinoma of skin, unspecified: Secondary | ICD-10-CM | POA: Diagnosis not present

## 2019-12-23 DIAGNOSIS — C61 Malignant neoplasm of prostate: Secondary | ICD-10-CM | POA: Diagnosis not present

## 2019-12-23 DIAGNOSIS — G912 (Idiopathic) normal pressure hydrocephalus: Secondary | ICD-10-CM | POA: Diagnosis not present

## 2020-01-05 ENCOUNTER — Encounter: Payer: Self-pay | Admitting: Radiation Oncology

## 2020-01-22 DIAGNOSIS — H2512 Age-related nuclear cataract, left eye: Secondary | ICD-10-CM | POA: Diagnosis not present

## 2020-03-04 DIAGNOSIS — H25811 Combined forms of age-related cataract, right eye: Secondary | ICD-10-CM | POA: Diagnosis not present

## 2020-03-04 DIAGNOSIS — H2511 Age-related nuclear cataract, right eye: Secondary | ICD-10-CM | POA: Diagnosis not present

## 2020-03-08 DIAGNOSIS — J90 Pleural effusion, not elsewhere classified: Secondary | ICD-10-CM | POA: Diagnosis not present

## 2020-03-08 DIAGNOSIS — G912 (Idiopathic) normal pressure hydrocephalus: Secondary | ICD-10-CM | POA: Diagnosis not present

## 2020-03-08 DIAGNOSIS — Z982 Presence of cerebrospinal fluid drainage device: Secondary | ICD-10-CM | POA: Diagnosis not present

## 2020-03-08 DIAGNOSIS — G9389 Other specified disorders of brain: Secondary | ICD-10-CM | POA: Diagnosis not present

## 2020-03-19 DIAGNOSIS — E785 Hyperlipidemia, unspecified: Secondary | ICD-10-CM | POA: Diagnosis not present

## 2020-03-19 DIAGNOSIS — Z125 Encounter for screening for malignant neoplasm of prostate: Secondary | ICD-10-CM | POA: Diagnosis not present

## 2020-03-19 DIAGNOSIS — E1151 Type 2 diabetes mellitus with diabetic peripheral angiopathy without gangrene: Secondary | ICD-10-CM | POA: Diagnosis not present

## 2020-03-26 DIAGNOSIS — I10 Essential (primary) hypertension: Secondary | ICD-10-CM | POA: Diagnosis not present

## 2020-03-26 DIAGNOSIS — R82998 Other abnormal findings in urine: Secondary | ICD-10-CM | POA: Diagnosis not present

## 2020-03-26 DIAGNOSIS — Z0000 Encounter for general adult medical examination without abnormal findings: Secondary | ICD-10-CM | POA: Diagnosis not present

## 2020-03-26 DIAGNOSIS — G4700 Insomnia, unspecified: Secondary | ICD-10-CM | POA: Diagnosis not present

## 2020-03-26 DIAGNOSIS — E785 Hyperlipidemia, unspecified: Secondary | ICD-10-CM | POA: Diagnosis not present

## 2020-03-26 DIAGNOSIS — E1151 Type 2 diabetes mellitus with diabetic peripheral angiopathy without gangrene: Secondary | ICD-10-CM | POA: Diagnosis not present

## 2020-04-19 DIAGNOSIS — Z1212 Encounter for screening for malignant neoplasm of rectum: Secondary | ICD-10-CM | POA: Diagnosis not present

## 2020-06-24 ENCOUNTER — Ambulatory Visit: Payer: Medicare Other | Attending: Internal Medicine

## 2020-06-24 DIAGNOSIS — Z23 Encounter for immunization: Secondary | ICD-10-CM

## 2020-06-24 NOTE — Progress Notes (Addendum)
   Cov[facility] Vaccination [facility]  Name:  GIORDAN FORDHAM    MRN: [id number] DOB: Jul 09, 1940  06/24/2020  Mr. Jarvie was observed post Covid-19 immunization for 30 minutes based on pre-vaccination screening without incident. He was provided with Vaccine Information Sheet and instruction to access the V-Safe system.   Mr. Haugan was instructed to call 911 with any severe reactions post vaccine: [name] Kitchen Difficulty breathing  . Swelling of face and throat  . A fast heartbeat  . A bad rash all over body  . Dizziness and weakness   Immunizations Administered    Name Date Dose VIS Date Route   Moderna Covid-19 Booster Vaccine 06/24/2020  3:03 PM 0.25 mL 03/10/2020 Intramuscular   Manufacturer: Moderna   Lot: [id number]   [place]: [id number]

## 2020-08-05 DIAGNOSIS — Z48811 Encounter for surgical aftercare following surgery on the nervous system: Secondary | ICD-10-CM | POA: Diagnosis not present

## 2020-08-05 DIAGNOSIS — Z982 Presence of cerebrospinal fluid drainage device: Secondary | ICD-10-CM | POA: Diagnosis not present

## 2020-08-05 DIAGNOSIS — R32 Unspecified urinary incontinence: Secondary | ICD-10-CM | POA: Diagnosis not present

## 2020-08-05 DIAGNOSIS — R2689 Other abnormalities of gait and mobility: Secondary | ICD-10-CM | POA: Diagnosis not present

## 2020-08-05 DIAGNOSIS — G912 (Idiopathic) normal pressure hydrocephalus: Secondary | ICD-10-CM | POA: Diagnosis not present

## 2020-08-08 IMAGING — CT CT HEAD W/O CM
4 series · 17 of 47 positions shown, 19 images · non-contrast
Comparison: 09/09/2018

CLINICAL DATA: Altered mental status, weakness, fever

EXAM:
CT HEAD WITHOUT CONTRAST
TECHNIQUE: Contiguous axial images were obtained from the base of the skull
through the vertex without intravenous contrast.

[Series 3: head wo · axial · 0.43mm/px · z∈[-106,+14]mm · 7 of 34 slices shown, 9 images]
[im 5/34  brain]
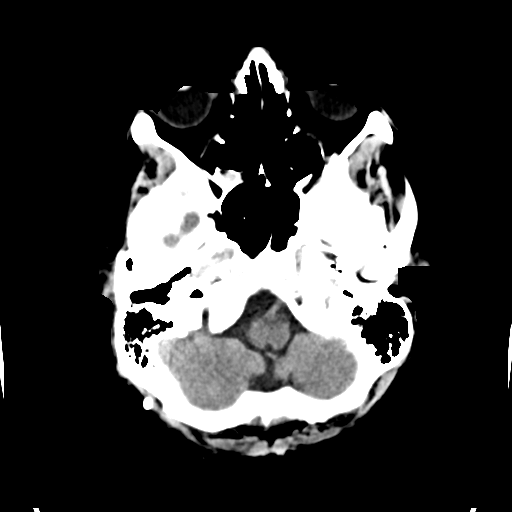
[im 5/34  bone]
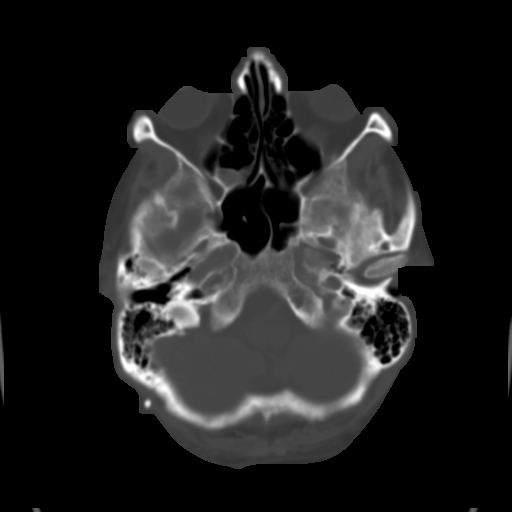
[im 9/34  brain]
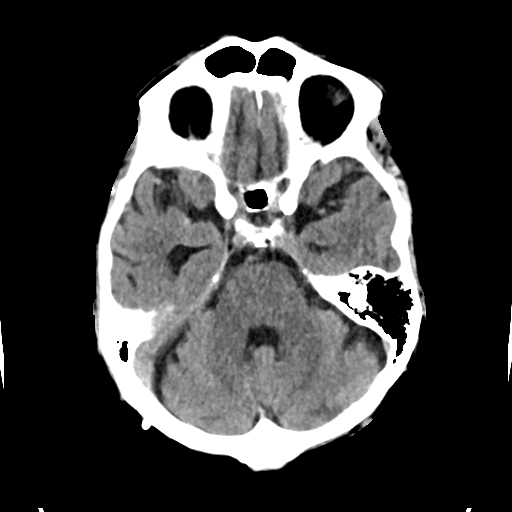
[im 13/34  brain]
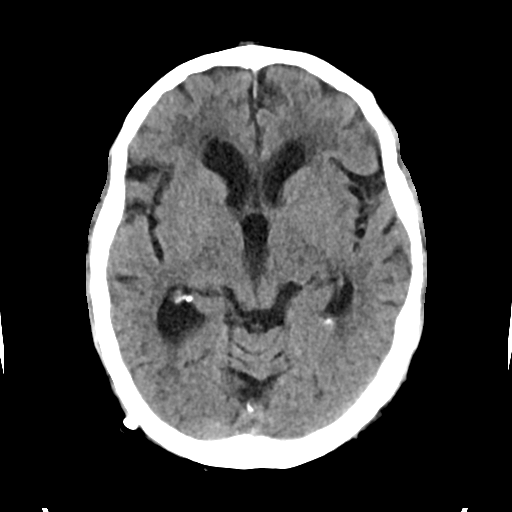
[im 17/34  brain]
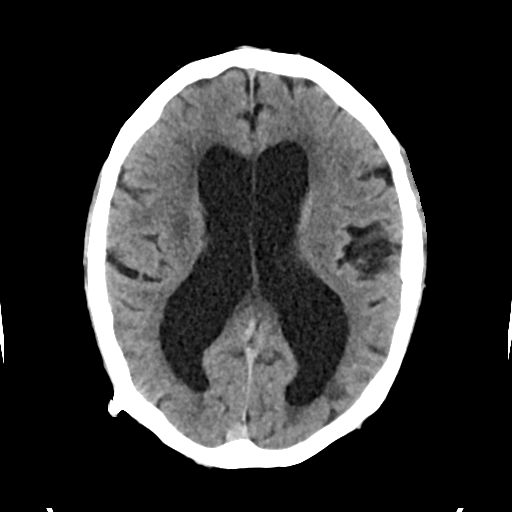
[im 21/34  brain]
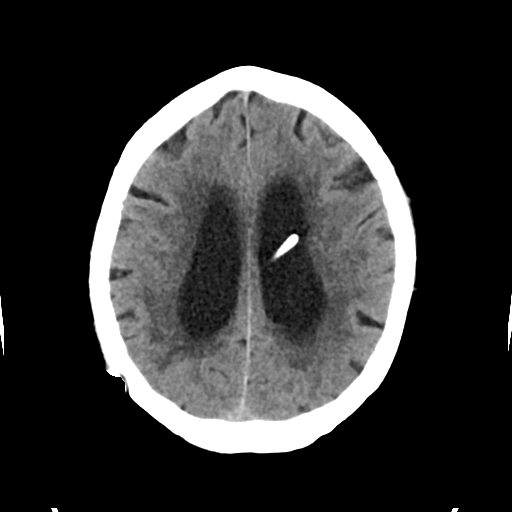
[im 21/34  bone]
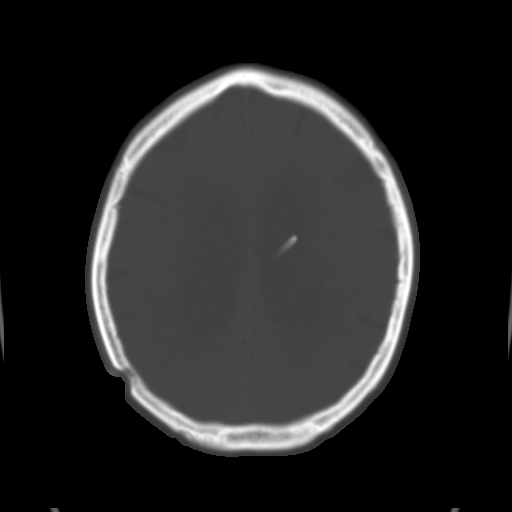
[im 25/34  brain]
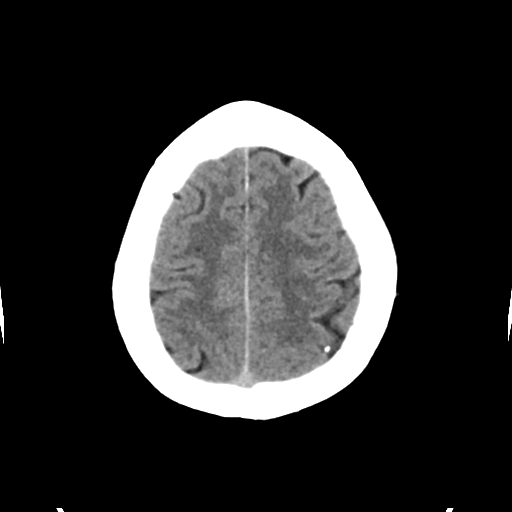
[im 29/34  brain]
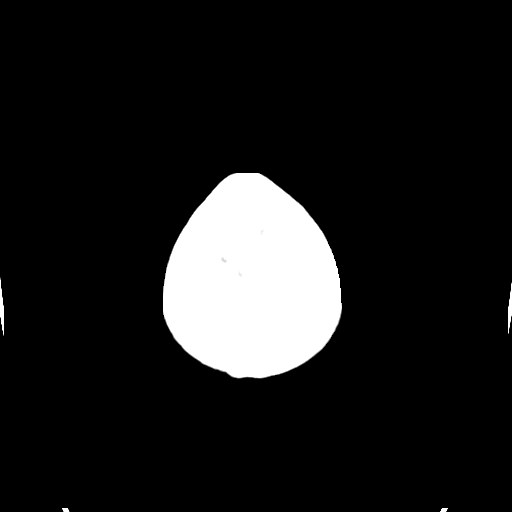

[Series 4: head bone · axial · 0.43mm/px · z∈[-110,-54]mm · 4 of 83 slices shown]
[im 9/83  bone]
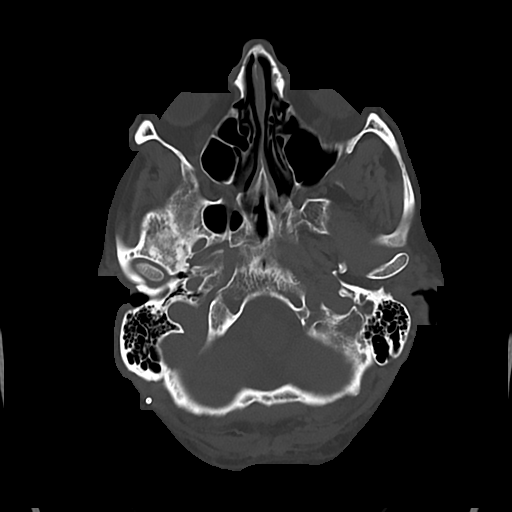
[im 17/83  bone]
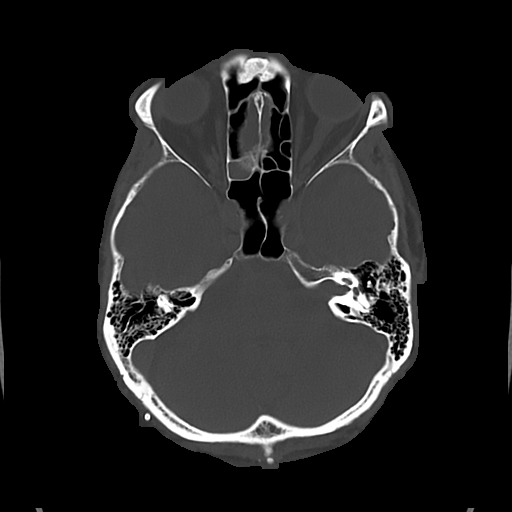
[im 25/83  bone]
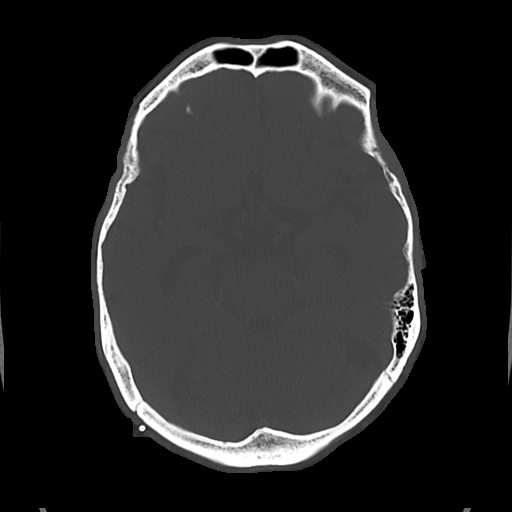
[im 37/83  bone]
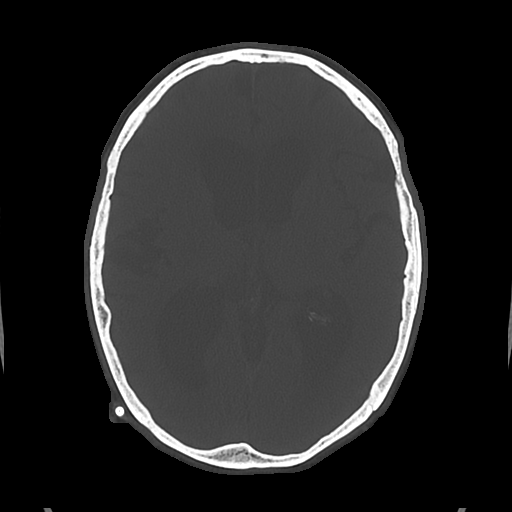

[Series 5: cor soft · coronal · 0.35mm/px · 3 of 68 slices shown]
[im 23/68  brain]
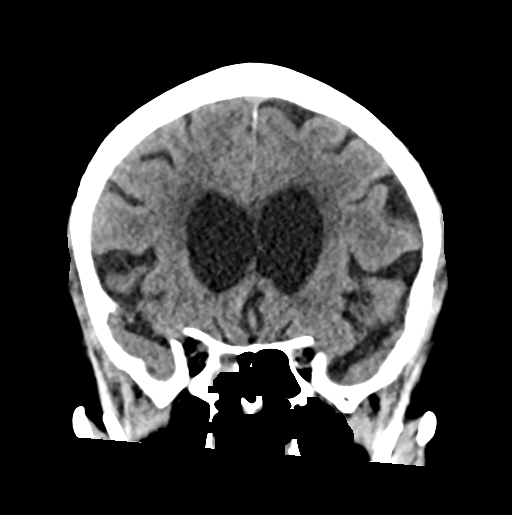
[im 30/68  brain]
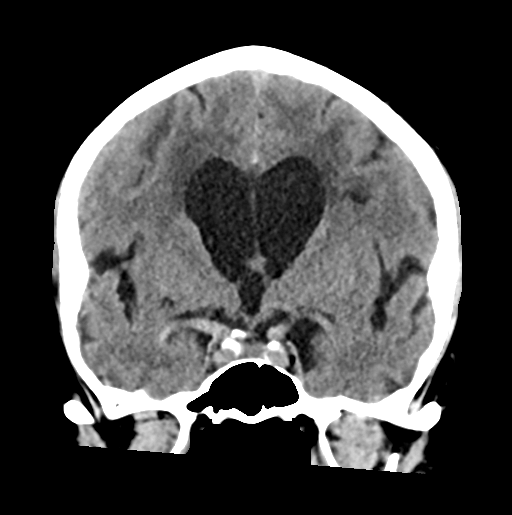
[im 38/68  brain]
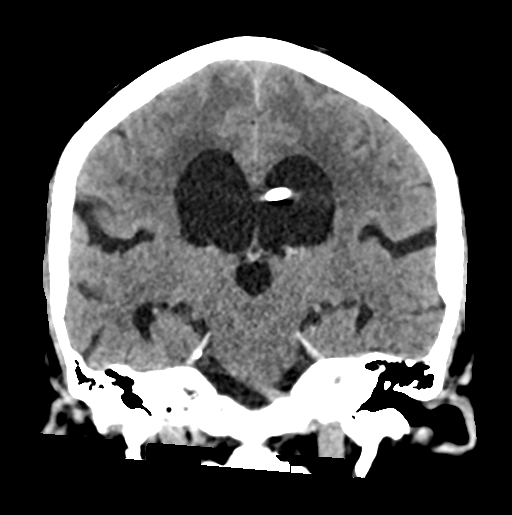

[Series 6: sag soft · sagittal · 0.32mm/px · 3 of 60 slices shown]
[im 20/60  brain]
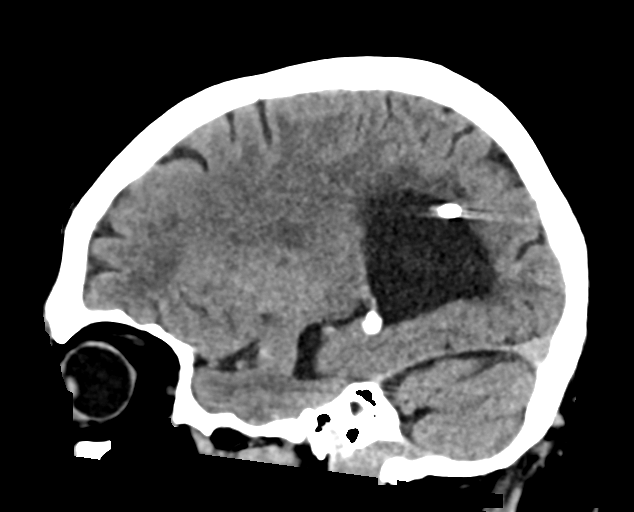
[im 30/60  brain]
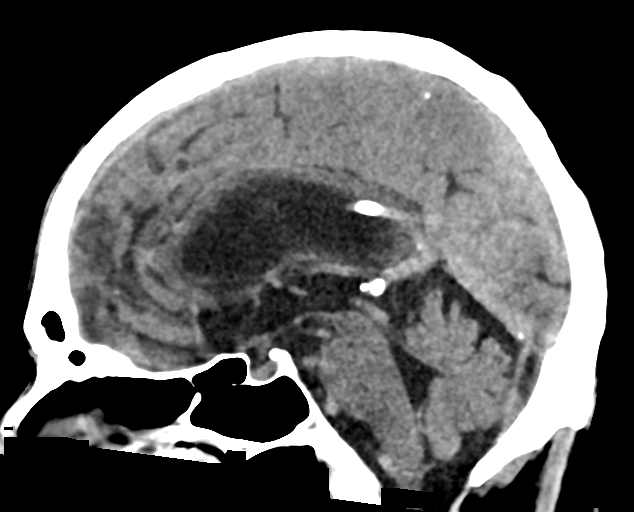
[im 40/60  brain]
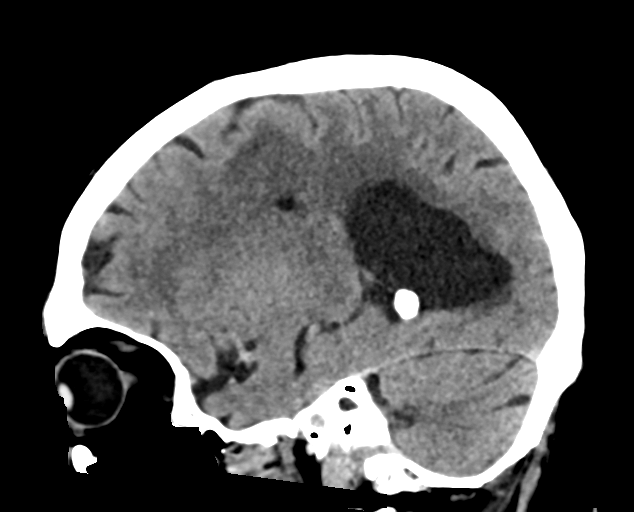

[17 of 47 positions shown; findings below may reference images not displayed]

FINDINGS: Brain: No evidence of acute infarction, hemorrhage, hydrocephalus,
extra-axial collection or mass lesion/mass effect. Periventricular
and deep white matter hypodensity. Unchanged right parietal approach
intraventricular shunt catheter, tip positioned in the left lateral
ventricle. No change in dilated caliber and configuration of the
ventricles.

Vascular: No hyperdense vessel or unexpected calcification.

Skull: Normal. Negative for fracture or focal lesion.

Sinuses/Orbits: No acute finding.

Other: None.
IMPRESSION: 1.  No acute intracranial pathology.

2. Unchanged right parietal approach intraventricular shunt
catheter, tip positioned in the left lateral ventricle. No change in
dilated caliber and configuration of the ventricles.

3.  Small-vessel white matter disease.

## 2020-08-22 ENCOUNTER — Encounter (HOSPITAL_BASED_OUTPATIENT_CLINIC_OR_DEPARTMENT_OTHER): Payer: Self-pay

## 2020-08-22 ENCOUNTER — Other Ambulatory Visit: Payer: Self-pay

## 2020-08-22 ENCOUNTER — Emergency Department (HOSPITAL_BASED_OUTPATIENT_CLINIC_OR_DEPARTMENT_OTHER): Payer: Medicare Other | Admitting: Radiology

## 2020-08-22 ENCOUNTER — Ambulatory Visit
Admission: EM | Admit: 2020-08-22 | Discharge: 2020-08-22 | Disposition: A | Discharge status: Other Acute Inpatient Hospital | Payer: Medicare Other

## 2020-08-22 ENCOUNTER — Inpatient Hospital Stay (HOSPITAL_BASED_OUTPATIENT_CLINIC_OR_DEPARTMENT_OTHER)
Admission: EM | Admit: 2020-08-22 | Discharge: 2020-08-25 | DRG: 388 | Disposition: A | Discharge status: Home Health | Payer: Medicare Other | Attending: Internal Medicine | Admitting: Internal Medicine

## 2020-08-22 DIAGNOSIS — I10 Essential (primary) hypertension: Secondary | ICD-10-CM | POA: Diagnosis present

## 2020-08-22 DIAGNOSIS — G912 (Idiopathic) normal pressure hydrocephalus: Secondary | ICD-10-CM | POA: Diagnosis present

## 2020-08-22 DIAGNOSIS — E440 Moderate protein-calorie malnutrition: Secondary | ICD-10-CM | POA: Diagnosis not present

## 2020-08-22 DIAGNOSIS — Z8249 Family history of ischemic heart disease and other diseases of the circulatory system: Secondary | ICD-10-CM | POA: Diagnosis not present

## 2020-08-22 DIAGNOSIS — Z8546 Personal history of malignant neoplasm of prostate: Secondary | ICD-10-CM

## 2020-08-22 DIAGNOSIS — E860 Dehydration: Secondary | ICD-10-CM

## 2020-08-22 DIAGNOSIS — Z923 Personal history of irradiation: Secondary | ICD-10-CM

## 2020-08-22 DIAGNOSIS — Z801 Family history of malignant neoplasm of trachea, bronchus and lung: Secondary | ICD-10-CM

## 2020-08-22 DIAGNOSIS — J9621 Acute and chronic respiratory failure with hypoxia: Secondary | ICD-10-CM | POA: Diagnosis present

## 2020-08-22 DIAGNOSIS — E119 Type 2 diabetes mellitus without complications: Secondary | ICD-10-CM | POA: Diagnosis not present

## 2020-08-22 DIAGNOSIS — Z833 Family history of diabetes mellitus: Secondary | ICD-10-CM

## 2020-08-22 DIAGNOSIS — R112 Nausea with vomiting, unspecified: Secondary | ICD-10-CM | POA: Diagnosis not present

## 2020-08-22 DIAGNOSIS — N400 Enlarged prostate without lower urinary tract symptoms: Secondary | ICD-10-CM | POA: Diagnosis not present

## 2020-08-22 DIAGNOSIS — Z85828 Personal history of other malignant neoplasm of skin: Secondary | ICD-10-CM | POA: Diagnosis not present

## 2020-08-22 DIAGNOSIS — Z6826 Body mass index (BMI) 26.0-26.9, adult: Secondary | ICD-10-CM

## 2020-08-22 DIAGNOSIS — Z87891 Personal history of nicotine dependence: Secondary | ICD-10-CM

## 2020-08-22 DIAGNOSIS — E785 Hyperlipidemia, unspecified: Secondary | ICD-10-CM | POA: Diagnosis not present

## 2020-08-22 DIAGNOSIS — Z66 Do not resuscitate: Secondary | ICD-10-CM | POA: Diagnosis present

## 2020-08-22 DIAGNOSIS — Z982 Presence of cerebrospinal fluid drainage device: Secondary | ICD-10-CM

## 2020-08-22 DIAGNOSIS — Z87442 Personal history of urinary calculi: Secondary | ICD-10-CM

## 2020-08-22 DIAGNOSIS — R1110 Vomiting, unspecified: Secondary | ICD-10-CM | POA: Diagnosis not present

## 2020-08-22 DIAGNOSIS — K56600 Partial intestinal obstruction, unspecified as to cause: Principal | ICD-10-CM | POA: Diagnosis present

## 2020-08-22 DIAGNOSIS — Z9989 Dependence on other enabling machines and devices: Secondary | ICD-10-CM

## 2020-08-22 DIAGNOSIS — Z79899 Other long term (current) drug therapy: Secondary | ICD-10-CM

## 2020-08-22 DIAGNOSIS — I517 Cardiomegaly: Secondary | ICD-10-CM | POA: Diagnosis not present

## 2020-08-22 DIAGNOSIS — Z7982 Long term (current) use of aspirin: Secondary | ICD-10-CM

## 2020-08-22 DIAGNOSIS — K56609 Unspecified intestinal obstruction, unspecified as to partial versus complete obstruction: Secondary | ICD-10-CM

## 2020-08-22 DIAGNOSIS — G4733 Obstructive sleep apnea (adult) (pediatric): Secondary | ICD-10-CM | POA: Diagnosis not present

## 2020-08-22 DIAGNOSIS — Z888 Allergy status to other drugs, medicaments and biological substances status: Secondary | ICD-10-CM | POA: Diagnosis not present

## 2020-08-22 DIAGNOSIS — Z20822 Contact with and (suspected) exposure to covid-19: Secondary | ICD-10-CM | POA: Diagnosis not present

## 2020-08-22 DIAGNOSIS — R197 Diarrhea, unspecified: Secondary | ICD-10-CM | POA: Diagnosis present

## 2020-08-22 DIAGNOSIS — R21 Rash and other nonspecific skin eruption: Secondary | ICD-10-CM | POA: Diagnosis not present

## 2020-08-22 DIAGNOSIS — N179 Acute kidney failure, unspecified: Secondary | ICD-10-CM | POA: Diagnosis present

## 2020-08-22 DIAGNOSIS — R531 Weakness: Secondary | ICD-10-CM

## 2020-08-22 DIAGNOSIS — J9601 Acute respiratory failure with hypoxia: Secondary | ICD-10-CM | POA: Diagnosis present

## 2020-08-22 DIAGNOSIS — I714 Abdominal aortic aneurysm, without rupture: Secondary | ICD-10-CM | POA: Diagnosis not present

## 2020-08-22 DIAGNOSIS — R110 Nausea: Secondary | ICD-10-CM | POA: Diagnosis not present

## 2020-08-22 DIAGNOSIS — R000 Tachycardia, unspecified: Secondary | ICD-10-CM | POA: Diagnosis not present

## 2020-08-22 DIAGNOSIS — R0902 Hypoxemia: Secondary | ICD-10-CM

## 2020-08-22 DIAGNOSIS — Z91138 Patient's unintentional underdosing of medication regimen for other reason: Secondary | ICD-10-CM

## 2020-08-22 DIAGNOSIS — Z8719 Personal history of other diseases of the digestive system: Secondary | ICD-10-CM

## 2020-08-22 DIAGNOSIS — T461X6A Underdosing of calcium-channel blockers, initial encounter: Secondary | ICD-10-CM | POA: Diagnosis present

## 2020-08-22 LAB — BASIC METABOLIC PANEL
Anion gap: 15 (ref 5–15)
BUN: 42 mg/dL — ABNORMAL HIGH (ref 8–23)
CO2: 32 mmol/L (ref 22–32)
Calcium: 9.9 mg/dL (ref 8.9–10.3)
Chloride: 97 mmol/L — ABNORMAL LOW (ref 98–111)
Creatinine, Ser: 2.07 mg/dL — ABNORMAL HIGH (ref 0.61–1.24)
GFR, Estimated: 32 mL/min — ABNORMAL LOW (ref >60)
Glucose, Bld: 181 mg/dL — ABNORMAL HIGH (ref 70–99)
Potassium: 3.9 mmol/L (ref 3.5–5.1)
Sodium: 144 mmol/L (ref 135–145)

## 2020-08-22 LAB — CBC WITH DIFFERENTIAL/PLATELET
Abs Immature Granulocytes: 0.05 10*3/uL (ref 0.00–0.07)
Basophils Absolute: 0 10*3/uL (ref 0.0–0.1)
Basophils Relative: 0 %
Eosinophils Absolute: 0 10*3/uL (ref 0.0–0.5)
Eosinophils Relative: 0 %
HCT: 57.8 % — ABNORMAL HIGH (ref 39.0–52.0)
Hemoglobin: 18.7 g/dL — ABNORMAL HIGH (ref 13.0–17.0)
Immature Granulocytes: 1 %
Lymphocytes Relative: 7 %
Lymphs Abs: 0.7 10*3/uL (ref 0.7–4.0)
MCH: 31.4 pg (ref 26.0–34.0)
MCHC: 32.4 g/dL (ref 30.0–36.0)
MCV: 97.1 fL (ref 80.0–100.0)
Monocytes Absolute: 0.8 10*3/uL (ref 0.1–1.0)
Monocytes Relative: 9 %
Neutro Abs: 7.3 10*3/uL (ref 1.7–7.7)
Neutrophils Relative %: 83 %
Platelets: 210 10*3/uL (ref 150–400)
RBC: 5.95 MIL/uL — ABNORMAL HIGH (ref 4.22–5.81)
RDW: 13.7 % (ref 11.5–15.5)
WBC: 8.9 10*3/uL (ref 4.0–10.5)
nRBC: 0 % (ref 0.0–0.2)

## 2020-08-22 LAB — RESP PANEL BY RT-PCR (FLU A&B, COVID) ARPGX2
Influenza A by PCR: NEGATIVE
Influenza B by PCR: NEGATIVE
SARS Coronavirus 2 by RT PCR: NEGATIVE

## 2020-08-22 LAB — TROPONIN I (HIGH SENSITIVITY)
Troponin I (High Sensitivity): 9 ng/L (ref <18)
Troponin I (High Sensitivity): 10 ng/L (ref <18)

## 2020-08-22 LAB — BRAIN NATRIURETIC PEPTIDE: B Natriuretic Peptide: 56.5 pg/mL (ref 0.0–100.0)

## 2020-08-22 LAB — D-DIMER, QUANTITATIVE: D-Dimer, Quant: 0.68 ug/mL-FEU — ABNORMAL HIGH (ref 0.00–0.50)

## 2020-08-22 MED ORDER — ONDANSETRON HCL 4 MG/2ML IJ SOLN
4.0000 mg | Freq: Once | INTRAMUSCULAR | Status: AC
  Administered 2020-08-22: 4 mg via INTRAVENOUS
  Filled 2020-08-22: qty 2

## 2020-08-22 MED ORDER — SODIUM CHLORIDE 0.9 % IV BOLUS
1000.0000 mL | Freq: Once | INTRAVENOUS | Status: AC
  Administered 2020-08-22: 1000 mL via INTRAVENOUS

## 2020-08-22 NOTE — Progress Notes (Signed)
New Admission Note:  Arrival Method: Via [facility] on stretcher to 67m03 Mental Orientation: Alert & Oriented x4 Telemetry: n/a Assessment: Completed Skin: Refer to flowsheet IV: Right AC Pain: 0/10 Tubes: Safety Measures: Safety Fall Prevention Plan discussed with patient. Admission: Completed [facility] [place] Orientation: Patient has been orientated to the room, unit and the staff.  Orders have been reviewed and are being implemented. Will continue to monitor the patient. Call light has been placed within reach.   Vassie Moselle, RN  Phone Number: [phone]

## 2020-08-22 NOTE — H&P (Incomplete)
Jeremiah [name]:[id number] DOB: February 24, 1941 DOA: 08/22/2020     PCP: [name], MD   Outpatient Specialists: * NONE CARDS: * Dr. NEphrology: *  Dr. NEurology *   Dr. Pulmonary *  Dr.  Oncology * Dr. Fabienne Bruns* Dr.  Sadie Haber, [facility]) Urology Dr. *  Patient arrived to ER on 08/22/20 at 1525 Referred by Attending Toy Baker, MD   Patient coming from: home Lives alone,   *** With family   Chief Complaint: Chief Complaint  Patient presents with  . Vomiting    HPI: Jeremiah [name] is a 80 y.o. male with medical history significant of NPH sp VP shunt, DM2, sleep apnea on CPAP, BPH, HLD HTN prostate cancer status post radiation therapy, AAA    Presented with  Fatigue N/v/D for the past 4 days In ER noted to be hypoxic down to 75% on Ra  and placed on 2L with improvemt He have had about 1 loose bowel movement per day.  Have had some cough runny nose no fever no chest pain no headache no abdominal pain just overall feels fairly weak and unwell     Infectious risk factors:  Reports*** fever, shortness of breath, dry cough, chest pain, Sore throat, URI symptoms, anosmia/change in taste, N/V/Diarrhea/abdominal pain,  Body aches, severe fatigue Reports known sick contacts, known COVID 19 exposure, inability to completely isolate   ***KNOWN COVID POSITIVE   Has ***NOt been vaccinated against COVID **and boosted   Initial COVID TEST  NEGATIVE**** POSITIVE,  ***in house  PCR testing  Pending  Lab Results  Component Value Date   SARSCOV2NAA NEGATIVE 08/22/2020   [place] NEGATIVE 07/14/2019   [place] NEGATIVE 07/05/2019   [place] NOT DETECTED 03/28/2019     Regarding pertinent Chronic problems:     HTN on Norvasc  ***chronic CHF diastolic/systolic/ combined - last echo***  *** CAD  - On Aspirin, statin, betablocker, Plavix                 - *followed by cardiology                - last cardiac cath  The ASCVD Risk score Mikey Bussing DC Jr., et al., 2013)  failed to calculate for the following reasons:   The 2013 ASCVD risk score is only valid for ages 71 to 27    DM 2 -  Lab Results  Component Value Date   HGBA1C 6.0 (H) 07/07/2019   ****on insulin, PO meds only, diet controlled    OSA -on nocturna CPAP     ***CKD stage III*- baseline Cr **** Estimated Creatinine Clearance: 29.4 mL/min (A) (by C-G formula based on SCr of 2.07 mg/dL (H)).  Lab Results  Component Value Date   CREATININE 2.07 (H) 08/22/2020   CREATININE 1.09 07/13/2019   CREATININE QUANTITY NOT SUFFICIENT, UNABLE TO PERFORM TEST 07/13/2019     While in ER: Mild hemoglobin up to 18 tachycardic up to 101     ED Triage Vitals  Enc Vitals Group     BP 08/22/20 1535 131/87     Pulse Rate 08/22/20 1535 (!) 101     Resp 08/22/20 1535 (!) 22     Temp 08/22/20 1535 99.2 F (37.3 C)     Temp Source 08/22/20 1535 Oral     SpO2 08/22/20 1535 91 %     Weight 08/22/20 1536 185 lb (83.9 kg)     Height 08/22/20 1536 5\' 10"  (1.778 m)  Head Circumference --      Peak Flow --      Pain Score 08/22/20 1535 0     Pain Loc --      Pain Edu? --      Excl. in [place]? --   TMAX(24)@     _________________________________________ Significant initial  Findings: Abnormal Labs Reviewed  CBC WITH DIFFERENTIAL/PLATELET - Abnormal; Notable for the following components:      Result Value   RBC 5.95 (*)    Hemoglobin 18.7 (*)    HCT 57.8 (*)    All other components within normal limits  BASIC METABOLIC PANEL - Abnormal; Notable for the following components:   Chloride 97 (*)    Glucose, Bld 181 (*)    BUN 42 (*)    Creatinine, Ser 2.07 (*)    GFR, Estimated 32 (*)    All other components within normal limits  D-DIMER, QUANTITATIVE - Abnormal; Notable for the following components:   D-Dimer, Quant 0.68 (*)    All other components within normal limits   ____________________________________________ Ordered CT HEAD *** NON acute  CXR - ***NON acute  CTabd/pelvis -  ***nonacute  CTA chest - ***nonacute, no PE, * no evidence of infiltrate   _________________________ Troponin ***ordered ECG: Ordered Personally reviewed by me showing: HR : 101 right axis deviation Rhythm:   Sinus tachycardia     no evidence of ischemic changes QTC 441  The recent clinical data is shown below. Vitals:   08/22/20 2115 08/22/20 2130 08/22/20 2200 08/22/20 2247  BP:  122/76 109/70 133/83  Pulse: 98 96 99 98  Resp: (!) 25 (!) 23 (!) 22 19  Temp:    98.2 F (36.8 C)  TempSrc:    Oral  SpO2: 90% 91% 92% 96%  Weight:      Height:       WBC     Component Value Date/Time   WBC 8.9 08/22/2020 1555   LYMPHSABS 0.7 08/22/2020 1555   MONOABS 0.8 08/22/2020 1555   EOSABS 0.0 08/22/2020 1555   BASOSABS 0.0 08/22/2020 1555       UA *** no evidence of UTI  ***Pending ***not ordered   Urine analysis:    Component Value Date/Time   COLORURINE YELLOW 07/04/2019 [place] 07/04/2019 2332   LABSPEC 1.014 07/04/2019 2332   PHURINE 6.0 07/04/2019 2332   GLUCOSEU 50 (A) 07/04/2019 2332   HGBUR SMALL (A) 07/04/2019 2332   BILIRUBINUR NEGATIVE 07/04/2019 2332   KETONESUR 5 (A) 07/04/2019 2332   PROTEINUR NEGATIVE 07/04/2019 2332   UROBILINOGEN 1.0 08/25/2008 0326   NITRITE NEGATIVE 07/04/2019 2332   LEUKOCYTESUR NEGATIVE 07/04/2019 2332    Results for orders placed or performed during the hospital encounter of 08/22/20  Resp Panel by RT-PCR (Flu A&B, Covid) Nasopharyngeal Swab     Status: None   Collection Time: 08/22/20  3:57 PM   Specimen: Nasopharyngeal Swab; Nasopharyngeal(NP) swabs in vial transport medium  Result Value Ref Range Status   SARS Coronavirus 2 by RT PCR NEGATIVE NEGATIVE Final         Influenza A by PCR NEGATIVE NEGATIVE Final   Influenza B by PCR NEGATIVE NEGATIVE Final          _______________________________________________ Hospitalist was called for admission for AKI due to dehydration  The following Work up has been  ordered so far:  Orders Placed This Encounter  Procedures  . Resp Panel by RT-PCR (Flu A&B, Covid) Nasopharyngeal Swab  .  DG Chest Port 1 View  . CBC with Differential  . Basic metabolic panel  . D-dimer, quantitative  . Brain natriuretic peptide  . Consult to hospitalist  . CPAP  . EKG 12-Lead  . Place in observation (patient's expected length of stay will be less than 2 midnights)    Following Medications were ordered in ER: Medications  ondansetron (ZOFRAN) injection 4 mg (4 mg Intravenous Given 08/22/20 1606)  sodium chloride 0.9 % bolus 1,000 mL (0 mLs Intravenous Stopped 08/22/20 1703)        Consult Orders  (From admission, onward)         Start     Ordered   08/22/20 1719  Consult to hospitalist  Spoke with carelink for hospitalist at 17:24  Once       Provider:  (Not yet assigned)  Question Answer Comment  Place call to: [facility]   Reason for Consult Admit      08/22/20 1718            OTHER Significant initial  Findings:  labs showing:    Recent Labs  Lab 08/22/20 1555  NA 144  K 3.9  CO2 32  GLUCOSE 181*  BUN 42*  CREATININE 2.07*  CALCIUM 9.9    Cr Up from baseline see below Lab Results  Component Value Date   CREATININE 2.07 (H) 08/22/2020   CREATININE 1.09 07/13/2019   CREATININE QUANTITY NOT SUFFICIENT, UNABLE TO PERFORM TEST 07/13/2019    No results for input(s): AST, ALT, ALKPHOS, BILITOT, PROT, ALBUMIN in the last 168 hours. Lab Results  Component Value Date   CALCIUM 9.9 08/22/2020      Plt: Lab Results  Component Value Date   PLT 210 08/22/2020    COVID-19 Labs  Recent Labs    08/22/20 1555  DDIMER 0.68*    Lab Results  Component Value Date   SARSCOV2NAA NEGATIVE 08/22/2020   SARSCOV2NAA NEGATIVE 07/14/2019   SARSCOV2NAA NEGATIVE 07/05/2019   SARSCOV2NAA NOT DETECTED 03/28/2019       Recent Labs  Lab 08/22/20 1555  WBC 8.9  NEUTROABS 7.3  HGB 18.7*  HCT 57.8*  MCV 97.1  PLT 210    HG/HCT   Stable,     Component Value Date/Time   HGB 18.7 (H) 08/22/2020 1555   HCT 57.8 (H) 08/22/2020 1555   MCV 97.1 08/22/2020 1555      Cardiac Panel (last 3 results) No results for input(s): CKTOTAL, CKMB, TROPONINI, RELINDX in the last 72 hours.   BNP (last 3 results) Recent Labs    08/22/20 1556  BNP 56.5      DM  labs:  HbA1C: No results for input(s): HGBA1C in the last 8760 hours.     CBG (last 3)  No results for input(s): GLUCAP in the last 72 hours.        Cultures:    Component Value Date/Time   SDES BLOOD LEFT HAND 07/12/2019 1525   SPECREQUEST  07/12/2019 1525    BOTTLES DRAWN AEROBIC ONLY Blood Culture adequate volume Performed at [facility] [address], [place], [place] [address]    CULT NO GROWTH 5 DAYS 07/12/2019 1525   REPTSTATUS 07/17/2019 FINAL 07/12/2019 1525     Radiological Exams on Admission: DG Chest Port 1 View  Result Date: 08/22/2020 CLINICAL DATA:  80 year old male with nausea and vomiting. EXAM: PORTABLE CHEST 1 VIEW COMPARISON:  Chest radiograph dated 07/17/2019 FINDINGS: There is eventration of the right hemidiaphragm.  There are bibasilar atelectasis. No focal consolidation, pleural effusion, pneumothorax. Borderline cardiomegaly. Atherosclerotic calcification of the aorta. No acute osseous pathology. Partially visualized right VP shunt. IMPRESSION: No active disease. Electronically Signed   By: Anner Crete M.D.   On: 08/22/2020 17:09   _______________________________________________________________________________________________________ Latest  Blood pressure 133/83, pulse 98, temperature 98.2 F (36.8 C), temperature source Oral, resp. rate 19, height 5\' 10"  (1.778 m), weight 83.9 kg, SpO2 96 %.   Review of Systems:    Pertinent positives include:   fatigue,nausea, vomiting, diarrhea,   Constitutional:  No weight loss, night sweats, Fevers, chills, weight loss  HEENT:  No headaches, Difficulty swallowing,Tooth/dental  problems,Sore throat,  No sneezing, itching, ear ache, nasal congestion, post nasal drip,  Cardio-vascular:  No chest pain, Orthopnea, PND, anasarca, dizziness, palpitations.no Bilateral lower extremity swelling  GI:  No heartburn, indigestion, abdominal pain, change in bowel habits, loss of appetite, melena, blood in stool, hematemesis Resp:  no shortness of breath at rest. No dyspnea on exertion, No excess mucus, no productive cough, No non-productive cough, No coughing up of blood.No change in color of mucus.No wheezing. Skin:  no rash or lesions. No jaundice GU:  no dysuria, change in color of urine, no urgency or frequency. No straining to urinate.  No flank pain.  Musculoskeletal:  No joint pain or no joint swelling. No decreased range of motion. No back pain.  Psych:  No change in mood or affect. No depression or anxiety. No memory loss.  Neuro: no localizing neurological complaints, no tingling, no weakness, no double vision, no gait abnormality, no slurred speech, no confusion  All systems reviewed and apart from [place] all are negative _______________________________________________________________________________________________ Past Medical History:   Past Medical History:  Diagnosis Date  . BPH (benign prostatic hyperplasia)   . Central retinal artery occlusion   . Diabetes mellitus without complication (HCC)    diet controlled  . Diverticulosis   . History of kidney stones   . Hyperlipidemia   . Hypertension   . OSA on CPAP    wears cpap  . Osteoarthritis   . 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 S/P radiation therapy 04/23/13 - 05/29/14   Prostate/seminal vesicles, external beam 4500 cGy in 25 sessions  . Seasonal allergies   . Skin cancer    scalp  . Wears glasses       Past Surgical History:  Procedure Laterality Date  . BACK SURGERY  2009  . COLONOSCOPY W/ BIOPSIES AND POLYPECTOMY    . [place]  . LEFT HEART CATH AND  CORONARY ANGIOGRAPHY N/A 03/14/2019   Procedure: LEFT HEART CATH AND CORONARY ANGIOGRAPHY;  Surgeon: [name], MD;  Location: [facility];  Service: Cardiovascular;  Laterality: N/A;  . PROSTATE BIOPSY  2013, 2014, 01/2014   Gleason 7  . RADIOACTIVE SEED IMPLANT N/A 07/01/2014   Procedure: RADIOACTIVE SEED IMPLANT;  Surgeon: [name], MD;  Location: [facility];  Service: Urology;  Laterality: N/A;  DR PORTABLE  . TONSILLECTOMY    . 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   Procedure: SHUNT INSERTION VENTRICULAR-PERITONEAL;  Surgeon: [name], MD;  Location: [facility];  Service: Neurosurgery;  Laterality: N/A;  SHUNT INSERTION VENTRICULAR-PERITONEAL    Social History:  Ambulatory *** independently cane, walker  wheelchair bound, bed bound     reports that he quit smoking about 59 years ago. His smoking use included cigarettes. He has never  used smokeless tobacco. He reports current alcohol use of about 1.0 standard drink of alcohol per week. He reports that he does not use drugs.     Family History: *** Family History  Problem Relation Age of Onset  . Heart attack Father 40  . Cancer Father        prostate  . Diabetes Father   . Cancer Paternal Uncle        prostate  . Dementia Mother 74  . Lung cancer Brother    ______________________________________________________________________________________________ Allergies: No Known Allergies   Prior to Admission medications   Medication Sig Start Date End Date Taking? Authorizing Provider  acetaminophen (TYLENOL) 500 MG tablet Take 1,000 mg by mouth at bedtime.    [provider]  Amino Acids-Protein Hydrolys (FEEDING SUPPLEMENT, PRO-STAT SUGAR FREE 64,) LIQD Take 30 mLs by mouth daily. 07/16/19   Cherylann Ratel A, DO  amLODipine (NORVASC) 5 MG tablet Take 1 tablet (5 mg total) by mouth daily. 07/16/19 08/15/19  Cherylann Ratel A, DO  Apoaequorin (PREVAGEN EXTRA STRENGTH) 20 MG CAPS  Take 20-40 mg by mouth daily.     [provider]  aspirin EC 81 MG tablet Take 81 mg by mouth daily.     [provider]  feeding supplement, ENSURE ENLIVE, (ENSURE ENLIVE) LIQD Take 237 mLs by mouth 2 (two) times daily between meals. 07/15/19   Cherylann Ratel A, DO  Melatonin 10 MG CAPS Take 10 mg by mouth at bedtime as needed (for sleep).     [provider]  Multiple Vitamin (MULTIVITAMIN WITH MINERALS) TABS tablet Take 1 tablet by mouth daily.    [provider]  Multiple Vitamins-Minerals (PRESERVISION AREDS 2 PO) Take 1 capsule by mouth 2 (two) times daily.     [provider]  pantoprazole (PROTONIX) 20 MG tablet Take 20 mg by mouth daily before breakfast.  02/19/19   [provider]  potassium chloride (K-DUR) 10 MEQ tablet Take 10 mEq by mouth 2 (two) times daily.  06/16/15   [provider]  senna-docusate (SENOKOT-S) 8.6-50 MG tablet Take 1 tablet by mouth daily.    [provider]  vitamin B-12 (CYANOCOBALAMIN) 250 MCG tablet Take 250 mcg by mouth daily.    [provider]    ___________________________________________________________________________________________________ Physical Exam: [id number] with BMI 08/22/2020 08/22/2020 08/22/2020  Height - - -  Weight - - -  BMI - - -  Systolic 482 707 867  Diastolic 83 70 76  Pulse 98 99 96     1. General:  in No ***Acute distress***increased work of breathing ***complaining of severe pain****agitated * Chronically ill *well *cachectic *toxic acutely ill -appearing 2. Psychological: Alert and *** Oriented 3. Head/ENT:   Moist *** Dry Mucous Membranes                          Head Non traumatic, neck supple                          Normal *** Poor Dentition 4. SKIN: normal *** decreased Skin turgor,  Skin clean Dry and intact no rash 5. Heart: Regular rate and rhythm no*** Murmur, no Rub or gallop 6. Lungs: ***Clear to auscultation bilaterally, no wheezes or  crackles   7. Abdomen: Soft, ***non-tender, Non distended *** obese ***bowel sounds present 8. Lower extremities: no clubbing, cyanosis, no ***edema 9. Neurologically Grossly intact, moving all 4 extremities  equally *** strength 5 out of 5 in all 4 extremities cranial nerves II through XII intact 10. MSK: Normal range of motion    Chart has been reviewed  ______________________________________________________________________________________________  Assessment/Plan  ***  Admitted for ***  Present on Admission: . Intractable nausea and vomiting   Other plan as per orders.  DVT prophylaxis:  SCD *** Lovenox       Code Status:    Code Status: Prior FULL CODE *** DNR/DNI ***comfort care as per patient ***family  I had personally discussed CODE STATUS with patient and family* I had spent *min discussing goals of care and CODE STATUS    Family Communication:   Family not at  Bedside  plan of care was discussed on the phone with *** Son, Daughter, Wife, Husband, Sister, Brother , father, mother  Disposition Plan:   *** likely will need placement for rehabilitation                          Back to current facility when stable                            To home once workup is complete and patient is stable  ***Following barriers for discharge:                            Electrolytes corrected                               Anemia corrected                             Pain controlled with PO medications                               Afebrile, white count improving able to transition to PO antibiotics                             Will need to be able to tolerate PO                            Will likely need home health, home O2, set up                           Will need consultants to evaluate patient prior to discharge  ****EXPECT DC tomorrow                    ***Would benefit from PT/OT eval prior to DC  Ordered                   Swallow eval - SLP ordered                    Diabetes care coordinator                   Transition of care consulted                   Nutrition    consulted  Wound care  consulted                   Palliative care    consulted                   Behavioral health  consulted                    Consults called: ***    Admission status:  ED Disposition    ED Disposition Condition [place]: [facility] [[id number]]  Level of Care: Med-Surg [16]  I expect the patient will be discharged within 24 hours: No (not a candidate for 5C-Observation unit)  Covid Evaluation: Confirmed COVID Negative  Diagnosis: Intractable nausea and vomiting [[id number]]  Admitting Physician: Doran Heater [[id number]]  Attending Physician: Doran Heater [phone]        Obs***  ***  inpatient     I Expect 2 midnight stay secondary to severity of patient's current illness need for inpatient interventions justified by the following: ***hemodynamic instability despite optimal treatment (tachycardia *hypotension * tachypnea *hypoxia, hypercapnia) * Severe lab/radiological/exam abnormalities including:     and extensive comorbidities including: *substance abuse  *Chronic pain *DM2  * CHF * CAD  * COPD/asthma *Morbid Obesity * CKD *dementia *liver disease *history of stroke with residual deficits *  malignancy, * sickle cell disease  . History of amputation . Chronic anticoagulation  That are currently affecting medical management.   I expect  patient to be hospitalized for 2 midnights requiring inpatient medical care.  Patient is at high risk for adverse outcome (such as loss of life or disability) if not treated.  Indication for inpatient stay as follows:  Severe change from baseline regarding mental status Hemodynamic instability despite maximal medical therapy,  ongoing suicidal ideations,  severe pain requiring acute inpatient management,  inability to maintain oral hydration    persistent chest pain despite medical management Need for operative/procedural  intervention New or worsening hypoxia  Need for IV antibiotics, IV fluids, IV rate controling medications, IV antihypertensives, IV pain medications, IV anticoagulation    Level of care   *** tele  For 12H 24H     medical floor       SDU tele indefinitely please discontinue once patient no longer qualifies COVID-19 Labs    Lab Results  Component Value Date   [place] NEGATIVE 08/22/2020     Precautions: admitted as *** Covid Negative  ***asymptomatic screening protocol****PUI *** covid positive No active isolations ***If Covid PCR is negative  - please DC precautions - would need additional investigation given very high risk for false native test result   PPE: Used by the provider:   N95***P100  eye Goggles,  Gloves ***gown     Critical***  Patient is critically ill due to  hemodynamic instability * respiratory failure *severe sepsis* ongoing chest pain*  They are at high risk for life/limb threatening clinical deterioration requiring frequent reassessment and modifications of care.  Services provided include examination of the patient, review of relevant ancillary tests, prescription of lifesaving therapies, review of medications and prophylactic therapy.  Total critical care time excluding separately billable procedures: 60*  Minutes.    Anastassia Doutova 08/22/2020, 11:27 PM ***  [facility]     after 2 AM please page floor coverage PA If 7AM-7PM, please contact the day team taking care of the patient using [email or web address]   Patient was evaluated in the  context of the global COVID-19 pandemic, which necessitated consideration that the patient might be at risk for infection with the SARS-CoV-2 virus that causes COVID-19. Institutional protocols and algorithms that pertain to the evaluation of patients at risk for COVID-19 are in a state of rapid change based on information released  by regulatory bodies including the CDC and federal and state organizations. These policies and algorithms were followed during the patient's care.

## 2020-08-22 NOTE — ED Triage Notes (Signed)
N/V/D x 4 days with associated weakness.  Denies CP/SOB on arrival

## 2020-08-22 NOTE — ED Notes (Signed)
Patient removed from O2 at this time. Per MD, O2 goal >/= 90%.

## 2020-08-22 NOTE — ED Notes (Signed)
Patient was placed back on 2 L [place] per O2 sats of 75%; O2 sats currently 95%.

## 2020-08-22 NOTE — ED Notes (Signed)
Pt to go to ED for further eval n/v and weakness with BP 100/66; pt son will take pt

## 2020-08-22 NOTE — ED Notes (Signed)
Pt states he wears CPAP at home but has been unable to recently. Pt wants to try CPAP tonight to see if adjustments can be made to help him tolerate better. Pts respiratory status is stable on [place] 2 Lpm w/no distress noted at this time. RT will continue to monitor.

## 2020-08-22 NOTE — H&P (Signed)
Jeremiah [name] FUX:[id number] DOB: 1940-06-22 DOA: 08/22/2020     PCP: [name], MD   Outpatient Specialists:  NONE   Patient arrived to ER on 08/22/20 at 1525 Referred by Attending Toy Baker, MD   Patient coming from: home Lives  With family   Chief Complaint: Chief Complaint  Patient presents with  . Vomiting    HPI: Jeremiah [name] is a 80 y.o. male with medical history significant of NPH sp VP shunt, DM2, sleep apnea on CPAP, BPH, HLD HTN prostate cancer status post radiation therapy, AAA    Presented with  Fatigue N/v/D for the past 4 days  In ER noted to be hypoxic down to 75% on Ra  and placed on 2L with improvement He have had about 1 loose bowel movement per day.  Have had some cough runny nose no fever no chest pain no headache no abdominal pain just overall feels fairly weak and unwell CNA comes out to see him Denies sick contacts No headache Has hx of NPH sp shunt and has recently been seen by Neurosurgery And was told the shunt was doing well  He has been told before that his oxygent drops low, he was told he must have CPAP But never was on oxygen  Infectious risk factors:  Reports N/V/Diarrhea severe fatigue   Has  been vaccinated against COVID and boosted   Initial COVID TEST  NEGATIVE  Lab Results  Component Value Date   SARSCOV2NAA NEGATIVE 08/22/2020   [place] NEGATIVE 07/14/2019   [place] NEGATIVE 07/05/2019   [place] NOT DETECTED 03/28/2019     Regarding pertinent Chronic problems:     HTN on Norvasc   DM 2 -  Lab Results  Component Value Date   HGBA1C 6.0 (H) 07/07/2019  , diet controlled    OSA -on nocturna CPAP not been using it regulary      While in ER: Mild hemoglobin up to 18 tachycardic up to 101 Chest x-ray showed persistently elevated right hemidiaphragm but nonacute    ED Triage Vitals  Enc Vitals Group     BP 08/22/20 1535 131/87     Pulse Rate 08/22/20 1535 (!) 101     Resp  08/22/20 1535 (!) 22     Temp 08/22/20 1535 99.2 F (37.3 C)     Temp Source 08/22/20 1535 Oral     SpO2 08/22/20 1535 91 %     Weight 08/22/20 1536 185 lb (83.9 kg)     Height 08/22/20 1536 5\' 10"  (1.778 m)     Head Circumference --      Peak Flow --      Pain Score 08/22/20 1535 0     Pain Loc --      Pain Edu? --      Excl. in [place]? --   TMAX(24)@     _________________________________________ Significant initial  Findings: Abnormal Labs Reviewed  CBC WITH DIFFERENTIAL/PLATELET - Abnormal; Notable for the following components:      Result Value   RBC 5.95 (*)    Hemoglobin 18.7 (*)    HCT 57.8 (*)    All other components within normal limits  BASIC METABOLIC PANEL - Abnormal; Notable for the following components:   Chloride 97 (*)    Glucose, Bld 181 (*)    BUN 42 (*)    Creatinine, Ser 2.07 (*)    GFR, Estimated 32 (*)    All other components within normal limits  D-DIMER,  QUANTITATIVE - Abnormal; Notable for the following components:   D-Dimer, Quant 0.68 (*)    All other components within normal limits   ____________________________________________  KUB showing small bowel obstruction versus ileus  CXR -  NON acute elevated right diaphragm  CTabd/pelvis - Ordered     _________________________ Troponin 8-9 ECG: Ordered Personally reviewed by me showing: HR : 101 right axis deviation Rhythm:   Sinus tachycardia     no evidence of ischemic changes QTC 441  The recent clinical data is shown below. Vitals:   08/22/20 2115 08/22/20 2130 08/22/20 2200 08/22/20 2247  BP:  122/76 109/70 133/83  Pulse: 98 96 99 98  Resp: (!) 25 (!) 23 (!) 22 19  Temp:    98.2 F (36.8 C)  TempSrc:    Oral  SpO2: 90% 91% 92% 96%  Weight:      Height:       WBC     Component Value Date/Time   WBC 8.9 08/22/2020 1555   LYMPHSABS 0.7 08/22/2020 1555   MONOABS 0.8 08/22/2020 1555   EOSABS 0.0 08/22/2020 1555   BASOSABS 0.0 08/22/2020 1555       UA   ordered     Results  for orders placed or performed during the hospital encounter of 08/22/20  Resp Panel by RT-PCR (Flu A&B, Covid) Nasopharyngeal Swab     Status: None   Collection Time: 08/22/20  3:57 PM   Specimen: Nasopharyngeal Swab; Nasopharyngeal(NP) swabs in vial transport medium  Result Value Ref Range Status   SARS Coronavirus 2 by RT PCR NEGATIVE NEGATIVE Final         Influenza A by PCR NEGATIVE NEGATIVE Final   Influenza B by PCR NEGATIVE NEGATIVE Final          _______________________________________________ Hospitalist was called for admission for AKI due to dehydration  The following Work up has been ordered so far:  Orders Placed This Encounter  Procedures  . Resp Panel by RT-PCR (Flu A&B, Covid) Nasopharyngeal Swab  . DG Chest Port 1 View  . CBC with Differential  . Basic metabolic panel  . D-dimer, quantitative  . Brain natriuretic peptide  . Consult to hospitalist  . CPAP  . EKG 12-Lead  . Place in observation (patient's expected length of stay will be less than 2 midnights)    Following Medications were ordered in ER: Medications  ondansetron (ZOFRAN) injection 4 mg (4 mg Intravenous Given 08/22/20 1606)  sodium chloride 0.9 % bolus 1,000 mL (0 mLs Intravenous Stopped 08/22/20 1703)        Consult Orders  (From admission, onward)         Start     Ordered   08/22/20 1719  Consult to hospitalist  Spoke with carelink for hospitalist at 17:24  Once       Provider:  (Not yet assigned)  Question Answer Comment  Place call to: [facility]   Reason for Consult Admit      08/22/20 1718            OTHER Significant initial  Findings:  labs showing:    Recent Labs  Lab 08/22/20 1555  NA 144  K 3.9  CO2 32  GLUCOSE 181*  BUN 42*  CREATININE 2.07*  CALCIUM 9.9    Cr Up from baseline see below Lab Results  Component Value Date   CREATININE 2.07 (H) 08/22/2020   CREATININE 1.09 07/13/2019   CREATININE QUANTITY NOT SUFFICIENT, UNABLE TO PERFORM  TEST  07/13/2019    No results for input(s): AST, ALT, ALKPHOS, BILITOT, PROT, ALBUMIN in the last 168 hours. Lab Results  Component Value Date   CALCIUM 9.9 08/22/2020      Plt: Lab Results  Component Value Date   PLT 210 08/22/2020    COVID-19 Labs  Recent Labs    08/22/20 1555  DDIMER 0.68*    Lab Results  Component Value Date   SARSCOV2NAA NEGATIVE 08/22/2020   SARSCOV2NAA NEGATIVE 07/14/2019   SARSCOV2NAA NEGATIVE 07/05/2019   SARSCOV2NAA NOT DETECTED 03/28/2019       Recent Labs  Lab 08/22/20 1555  WBC 8.9  NEUTROABS 7.3  HGB 18.7*  HCT 57.8*  MCV 97.1  PLT 210    HG/HCT  Stable,     Component Value Date/Time   HGB 18.7 (H) 08/22/2020 1555   HCT 57.8 (H) 08/22/2020 1555   MCV 97.1 08/22/2020 1555      Cardiac Panel (last 3 results) No results for input(s): CKTOTAL, CKMB, TROPONINI, RELINDX in the last 72 hours.   BNP (last 3 results) Recent Labs    08/22/20 1556  BNP 56.5      DM  labs:  HbA1C: No results for input(s): HGBA1C in the last 8760 hours.     CBG (last 3)  No results for input(s): GLUCAP in the last 72 hours.        Cultures:    Component Value Date/Time   SDES BLOOD LEFT HAND 07/12/2019 1525   SPECREQUEST  07/12/2019 1525    BOTTLES DRAWN AEROBIC ONLY Blood Culture adequate volume Performed at [facility] [address], [place],  [address]    CULT NO GROWTH 5 DAYS 07/12/2019 1525   REPTSTATUS 07/17/2019 FINAL 07/12/2019 1525     Radiological Exams on Admission: DG Chest Port 1 View  Result Date: 08/22/2020 CLINICAL DATA:  80 year old male with nausea and vomiting. EXAM: PORTABLE CHEST 1 VIEW COMPARISON:  Chest radiograph dated 07/17/2019 FINDINGS: There is eventration of the right hemidiaphragm. There are bibasilar atelectasis. No focal consolidation, pleural effusion, pneumothorax. Borderline cardiomegaly. Atherosclerotic calcification of the aorta. No acute osseous pathology. Partially visualized right VP  shunt. IMPRESSION: No active disease. Electronically Signed   By: Anner Crete M.D.   On: 08/22/2020 17:09   _______________________________________________________________________________________________________ Latest  Blood pressure 133/83, pulse 98, temperature 98.2 F (36.8 C), temperature source Oral, resp. rate 19, height 5\' 10"  (1.778 m), weight 83.9 kg, SpO2 96 %.   Review of Systems:    Pertinent positives include:   fatigue,nausea, vomiting, diarrhea,   Constitutional:  No weight loss, night sweats, Fevers, chills, weight loss  HEENT:  No headaches, Difficulty swallowing,Tooth/dental problems,Sore throat,  No sneezing, itching, ear ache, nasal congestion, post nasal drip,  Cardio-vascular:  No chest pain, Orthopnea, PND, anasarca, dizziness, palpitations.no Bilateral lower extremity swelling  GI:  No heartburn, indigestion, abdominal pain, change in bowel habits, loss of appetite, melena, blood in stool, hematemesis Resp:  no shortness of breath at rest. No dyspnea on exertion, No excess mucus, no productive cough, No non-productive cough, No coughing up of blood.No change in color of mucus.No wheezing. Skin:  no rash or lesions. No jaundice GU:  no dysuria, change in color of urine, no urgency or frequency. No straining to urinate.  No flank pain.  Musculoskeletal:  No joint pain or no joint swelling. No decreased range of motion. No back pain.  Psych:  No change in mood or affect. No depression  or anxiety. No memory loss.  Neuro: no localizing neurological complaints, no tingling, no weakness, no double vision, no gait abnormality, no slurred speech, no confusion  All systems reviewed and apart from [place] all are negative _______________________________________________________________________________________________ Past Medical History:   Past Medical History:  Diagnosis Date  . BPH (benign prostatic hyperplasia)   . Central retinal artery occlusion   .  Diabetes mellitus without complication (HCC)    diet controlled  . Diverticulosis   . History of kidney stones   . Hyperlipidemia   . Hypertension   . OSA on CPAP    wears cpap  . Osteoarthritis   . 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 S/P radiation therapy 04/23/13 - 05/29/14   Prostate/seminal vesicles, external beam 4500 cGy in 25 sessions  . Seasonal allergies   . Skin cancer    scalp  . Wears glasses       Past Surgical History:  Procedure Laterality Date  . BACK SURGERY  2009  . COLONOSCOPY W/ BIOPSIES AND POLYPECTOMY    . [place]  . LEFT HEART CATH AND CORONARY ANGIOGRAPHY N/A 03/14/2019   Procedure: LEFT HEART CATH AND CORONARY ANGIOGRAPHY;  Surgeon: [name], MD;  Location: [facility];  Service: Cardiovascular;  Laterality: N/A;  . PROSTATE BIOPSY  2013, 2014, 01/2014   Gleason 7  . RADIOACTIVE SEED IMPLANT N/A 07/01/2014   Procedure: RADIOACTIVE SEED IMPLANT;  Surgeon: [name], MD;  Location[facility];  Service: Urology;  Laterality: N/A;  DR PORTABLE  . TONSILLECTOMY    . 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   Procedure: SHUNT INSERTION VENTRICULAR-PERITONEAL;  Surgeon: [name], MD;  Location: [facility];  Service: Neurosurgery;  Laterality: N/A;  SHUNT INSERTION VENTRICULAR-PERITONEAL    Social History:  Ambulatory   Independently      reports that he quit smoking about 59 years ago. His smoking use included cigarettes. He has never used smokeless tobacco. He reports current alcohol use of about 1.0 standard drink of alcohol per week. He reports that he does not use drugs.   Family History:   Family History  Problem Relation Age of Onset  . Heart attack Father 18  . Cancer Father        prostate  . Diabetes Father   . Cancer Paternal Uncle        prostate  . Dementia Mother 1  . Lung cancer Brother     ______________________________________________________________________________________________ Allergies: No Known Allergies   Prior to Admission medications   Medication Sig Start Date End Date Taking? Authorizing Provider  acetaminophen (TYLENOL) 500 MG tablet Take 1,000 mg by mouth at bedtime.    [provider]  Amino Acids-Protein Hydrolys (FEEDING SUPPLEMENT, PRO-STAT SUGAR FREE 64,) LIQD Take 30 mLs by mouth daily. 07/16/19   Cherylann Ratel A, DO  amLODipine (NORVASC) 5 MG tablet Take 1 tablet (5 mg total) by mouth daily. 07/16/19 08/15/19  Cherylann Ratel A, DO  Apoaequorin (PREVAGEN EXTRA STRENGTH) 20 MG CAPS Take 20-40 mg by mouth daily.     [provider]  aspirin EC 81 MG tablet Take 81 mg by mouth daily.     [provider]  feeding supplement, ENSURE ENLIVE, (ENSURE ENLIVE) LIQD Take 237 mLs by mouth 2 (two) times daily between meals. 07/15/19   Cherylann Ratel A, DO  Melatonin 10 MG CAPS Take 10 mg by mouth at bedtime as needed (  for sleep).     [provider]  Multiple Vitamin (MULTIVITAMIN WITH MINERALS) TABS tablet Take 1 tablet by mouth daily.    [provider]  Multiple Vitamins-Minerals (PRESERVISION AREDS 2 PO) Take 1 capsule by mouth 2 (two) times daily.     [provider]  pantoprazole (PROTONIX) 20 MG tablet Take 20 mg by mouth daily before breakfast.  02/19/19   [provider]  potassium chloride (K-DUR) 10 MEQ tablet Take 10 mEq by mouth 2 (two) times daily.  06/16/15   [provider]  senna-docusate (SENOKOT-S) 8.6-50 MG tablet Take 1 tablet by mouth daily.    [provider]  vitamin B-12 (CYANOCOBALAMIN) 250 MCG tablet Take 250 mcg by mouth daily.    [provider]    ___________________________________________________________________________________________________ Physical Exam: XNATFT with BMI 08/22/2020 08/22/2020 08/22/2020  Height - - -  Weight - - -  BMI - - -  Systolic 732  202 542  Diastolic 83 70 76  Pulse 98 99 96     1. General:  in No  Acute distress    Chronically ill  -appearing 2. Psychological: Alert and    Oriented 3. Head/ENT:   Dry Mucous Membranes                          Head Non traumatic, neck supple                           Poor Dentition 4. SKIN:   decreased Skin turgor,  Skin clean Dry and intact no rash 5. Heart: Regular rate and rhythm no  Murmur, no Rub or gallop 6. Lungs:   no wheezes or crackles   7. Abdomen: Soft,  non-tender,   distended  bowel sounds present high-pitched 8. Lower extremities: no clubbing, cyanosis, no  edema 9. Neurologically Grossly intact, moving all 4 extremities equally   10. MSK: Normal range of motion    Chart has been reviewed  ______________________________________________________________________________________________  Assessment/Plan  80 y.o. male with medical history significant of NPH sp VP shunt, DM2, sleep apnea on CPAP, BPH, HLD HTN prostate cancer status post radiation therapy, AAA    Admitted for AKI and dehydration  Present on Admission: . Intractable nausea and vomiting plain images showed possible ileus will obtain CT to further evaluate keep n.p.o. for now  . HTN (hypertension) allow permissive hypertension for tonight   . HLD (hyperlipidemia) resume home medications when able to tolerate  . Dehydration, moderate rehydrate and follow fluid status  . 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 Acute kidney injury ([place]) likely secondary to dehydration will rehydrate Obtain urine electrolytes  DM 2 -  - Order Sensitive   SSI   - diet controlled   Other plan  as per orders.  DVT prophylaxis:  SCD    Code Status:    Code Status: Prior   DNR/DNI as per patient  I had personally discussed CODE STATUS with patient   Family Communication:   Family not at  Bedside   Disposition Plan:     To home once workup is complete  and patient is stable    Following barriers for discharge:                            Electrolytes corrected                                                            Will need to be able to tolerate PO                            Will likely need home health, home O2, set up                                       Would benefit from PT/OT eval prior to DC  Ordered                                        Consults called: none   Admission status:  ED Disposition    ED Disposition Condition [place]: [place] [[id number]]  Level of Care: Med-Surg [16]  I expect the patient will be discharged within 24 hours: No (not a candidate for 5C-Observation unit)  Covid Evaluation: Confirmed COVID Negative  Diagnosis: Intractable nausea and vomiting [099833]  Admitting Physician: Doran Heater [[id number]]  Attending Physician: Doran Heater [[id number]]        Obs   Level of care       medical floor        Lab Results  Component Value Date   [place] NEGATIVE 08/22/2020     Precautions: admitted as Covid Negative      PPE: Used by the provider:   N95  eye Goggles,  Gloves     Jeromey Kruer 08/23/2020, 1:40 AM     [facility]     after 2 AM please page floor coverage PA If 7AM-7PM, please contact the day team taking care of the patient using [email or web address]   Patient was evaluated in the context of the global COVID-19 pandemic, which necessitated consideration that the patient might be at risk for infection with the SARS-CoV-2 virus that causes COVID-19. Institutional protocols and algorithms that pertain to the evaluation of patients at risk for COVID-19 are in a  state of rapid change based on information released by regulatory bodies including the CDC and federal and state organizations. These policies and algorithms were followed during the patient's care.

## 2020-08-22 NOTE — ED Notes (Signed)
Patient placed on [place] @ 3 L per O2 sats of 85-88% on RA. Patient BBS with fine crackles noted. RN and MD aware.

## 2020-08-22 NOTE — ED Provider Notes (Signed)
[place] EMERGENCY DEPT Provider Note   CSN: [id number] Arrival date & time: 08/22/20  1525     History Chief Complaint  Patient presents with  . Vomiting    Jeremiah [name] is a 80 y.o. male.  Patient with history of VP shunt and diabetes sleep apnea, presents with chief complaint of vomiting dehydration generalized fatigue.  Jeremiah [name] states that has been having vomiting 3-4 episodes a day nonbloody nonbilious for the past 4 days.  Jeremiah [name] had loose stools maybe one episode a day for the past few days as well.  Jeremiah [name] also complains of a mild cough and runny nose but denies any fevers.  Denies any pain, no headache no chest pain no abdominal pain.  Jeremiah [name] states Jeremiah [name] feels very weak.        Past Medical History:  Diagnosis Date  . BPH (benign prostatic hyperplasia)   . Central retinal artery occlusion   . Diabetes mellitus without complication (HCC)    diet controlled  . Diverticulosis   . History of kidney stones   . Hyperlipidemia   . Hypertension   . OSA on CPAP    wears cpap  . Osteoarthritis   . 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 S/P radiation therapy 04/23/13 - 05/29/14   Prostate/seminal vesicles, external beam 4500 cGy in 25 sessions  . Seasonal allergies   . Skin cancer    scalp  . Wears glasses     Patient Active Problem List   Diagnosis Date Noted  . Intractable nausea and vomiting 08/22/2020  . Palliative care by specialist   . Goals of care, counseling/discussion   . DNR (do not resuscitate)   . Muscular weakness   . Xerostomia   . Pneumonia 07/05/2019  . Acute respiratory failure with hypoxia ([place]) 07/05/2019  . Tachycardia 07/05/2019  . AMS (altered mental status) 07/05/2019  . Other fatigue   . 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 Small bowel obstruction ([place]) 06/10/2016  . HLD (hyperlipidemia) 06/10/2016  . HTN (hypertension) 06/10/2016  . Acute kidney injury ([place]) 06/10/2016  . Dehydration, moderate 06/10/2016   . AAA (abdominal aortic aneurysm) ([place]) 06/10/2016  . OSA on CPAP 06/10/2016  . Malignant neoplasm of prostate s/p radiation 2015 03/10/2014    Past Surgical History:  Procedure Laterality Date  . BACK SURGERY  2009  . COLONOSCOPY W/ BIOPSIES AND POLYPECTOMY    . [place]  . LEFT HEART CATH AND CORONARY ANGIOGRAPHY N/A 03/14/2019   Procedure: LEFT HEART CATH AND CORONARY ANGIOGRAPHY;  Surgeon: [name], MD;  Location: [facility];  Service: Cardiovascular;  Laterality: N/A;  . PROSTATE BIOPSY  2013, 2014, 01/2014   Gleason 7  . RADIOACTIVE SEED IMPLANT N/A 07/01/2014   Procedure: RADIOACTIVE SEED IMPLANT;  Surgeon: [name], MD;  Location: [facility];  Service: Urology;  Laterality: N/A;  DR PORTABLE  . TONSILLECTOMY    . 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   Procedure: SHUNT INSERTION VENTRICULAR-PERITONEAL;  Surgeon: [name], MD;  Location: [facility];  Service: Neurosurgery;  Laterality: N/A;  SHUNT INSERTION VENTRICULAR-PERITONEAL       Family History  Problem Relation Age of Onset  . Heart attack Father 63  . Cancer Father        prostate  . Diabetes Father   . Cancer Paternal Uncle        prostate  . Dementia Mother 37  .  Lung cancer Brother     Social History   Tobacco Use  . Smoking status: Former Smoker    Types: Cigarettes    Quit date: 03/10/1961    Years since quitting: 59.4  . Smokeless tobacco: Never Used  Vaping Use  . Vaping Use: Never used  Substance Use Topics  . Alcohol use: Yes    Alcohol/week: 1.0 standard drink    Types: 1 Glasses of wine per week    Comment: rare alcohol  . Drug use: No    Home Medications Prior to Admission medications   Medication Sig Start Date End Date Taking? Authorizing Provider  acetaminophen (TYLENOL) 500 MG tablet Take 1,000 mg by mouth at bedtime.    [provider]  Amino Acids-Protein Hydrolys (FEEDING SUPPLEMENT, PRO-STAT SUGAR FREE  64,) LIQD Take 30 mLs by mouth daily. 07/16/19   Cherylann Ratel A, DO  amLODipine (NORVASC) 5 MG tablet Take 1 tablet (5 mg total) by mouth daily. 07/16/19 08/15/19  Cherylann Ratel A, DO  Apoaequorin (PREVAGEN EXTRA STRENGTH) 20 MG CAPS Take 20-40 mg by mouth daily.     [provider]  aspirin EC 81 MG tablet Take 81 mg by mouth daily.     [provider]  feeding supplement, ENSURE ENLIVE, (ENSURE ENLIVE) LIQD Take 237 mLs by mouth 2 (two) times daily between meals. 07/15/19   Cherylann Ratel A, DO  Melatonin 10 MG CAPS Take 10 mg by mouth at bedtime as needed (for sleep).     [provider]  Multiple Vitamin (MULTIVITAMIN WITH MINERALS) TABS tablet Take 1 tablet by mouth daily.    [provider]  Multiple Vitamins-Minerals (PRESERVISION AREDS 2 PO) Take 1 capsule by mouth 2 (two) times daily.     [provider]  pantoprazole (PROTONIX) 20 MG tablet Take 20 mg by mouth daily before breakfast.  02/19/19   [provider]  potassium chloride (K-DUR) 10 MEQ tablet Take 10 mEq by mouth 2 (two) times daily.  06/16/15   [provider]  senna-docusate (SENOKOT-S) 8.6-50 MG tablet Take 1 tablet by mouth daily.    [provider]  vitamin B-12 (CYANOCOBALAMIN) 250 MCG tablet Take 250 mcg by mouth daily.    [provider]    Allergies    Patient has no known allergies.  Review of Systems   Review of Systems  Constitutional: Negative for fever.  HENT: Negative for ear pain and sore throat.   Eyes: Negative for pain.  Respiratory: Positive for cough.   Cardiovascular: Negative for chest pain.  Gastrointestinal: Negative for abdominal pain.  Genitourinary: Negative for flank pain.  Musculoskeletal: Negative for back pain.  Skin: Negative for color change and rash.  Neurological: Negative for syncope.  All other systems reviewed and are negative.   Physical Exam Updated Vital Signs BP 104/72   Pulse 93   Temp 99.2 F  (37.3 C) (Oral)   Resp (!) 23   Ht 5\' 10"  (1.778 m)   Wt 83.9 kg   SpO2 95%   BMI 26.54 kg/m   Physical Exam Constitutional:      General: Jeremiah [name] is not in acute distress.    Appearance: Jeremiah [name] is well-developed.  HENT:     Head: Normocephalic.     Nose: Nose normal.  Eyes:     Extraocular Movements: Extraocular movements intact.  Cardiovascular:     Rate and Rhythm: Normal rate.  Pulmonary:     Effort: Pulmonary effort is normal.  Skin:    Coloration: Skin is not jaundiced.  Neurological:     General: No focal deficit present.     Mental Status: Jeremiah [name] is alert and oriented to person, place, and time. Mental status is at baseline.     Cranial Nerves: No cranial nerve deficit.     Motor: No weakness.     ED Results / Procedures / Treatments   Labs (all labs ordered are listed, but only abnormal results are displayed) Labs Reviewed  CBC WITH DIFFERENTIAL/PLATELET - Abnormal; Notable for the following components:      Result Value   RBC 5.95 (*)    Hemoglobin 18.7 (*)    HCT 57.8 (*)    All other components within normal limits  BASIC METABOLIC PANEL - Abnormal; Notable for the following components:   Chloride 97 (*)    Glucose, Bld 181 (*)    BUN 42 (*)    Creatinine, Ser 2.07 (*)    GFR, Estimated 32 (*)    All other components within normal limits  D-DIMER, QUANTITATIVE - Abnormal; Notable for the following components:   D-Dimer, Quant 0.68 (*)    All other components within normal limits  RESP PANEL BY RT-PCR (FLU A&B, COVID) ARPGX2  BRAIN NATRIURETIC PEPTIDE  TROPONIN I (HIGH SENSITIVITY)  TROPONIN I (HIGH SENSITIVITY)    EKG EKG Interpretation  Date/Time:  Sunday August 22 2020 15:31:14 EDT Ventricular Rate:  101 PR Interval:  144 QRS Duration: 111 QT Interval:  340 QTC Calculation: 441 R Axis:   88 Text Interpretation: Sinus tachycardia Probable left atrial enlargement Borderline right axis deviation Low voltage, precordial leads Confirmed by Thamas Jaegers  ([id number]) on 08/22/2020 3:37:37 PM   Radiology DG Chest Port 1 View  Result Date: 08/22/2020 CLINICAL DATA:  80 year old male with nausea and vomiting. EXAM: PORTABLE CHEST 1 VIEW COMPARISON:  Chest radiograph dated 07/17/2019 FINDINGS: There is eventration of the right hemidiaphragm. There are bibasilar atelectasis. No focal consolidation, pleural effusion, pneumothorax. Borderline cardiomegaly. Atherosclerotic calcification of the aorta. No acute osseous pathology. Partially visualized right VP shunt. IMPRESSION: No active disease. Electronically Signed   By: Anner Crete M.D.   On: 08/22/2020 17:09    Procedures Procedures   Medications Ordered in ED Medications  ondansetron (ZOFRAN) injection 4 mg (4 mg Intravenous Given 08/22/20 1606)  sodium chloride 0.9 % bolus 1,000 mL (0 mLs Intravenous Stopped 08/22/20 1703)    ED Course  I have reviewed the triage vital signs and the nursing notes.  Pertinent labs & imaging results that were available during my care of the patient were reviewed by me and considered in my medical decision making (see chart for details).    MDM Rules/Calculators/A&P                          Patient presents with normal blood pressure normal heart rate.  Oxygenation drops down to about 90% on room air which is low for him looking at his history.  However the patient states Jeremiah [name] feels no shortness of breath or chest tightness or difficulty breathing.  At times when Jeremiah [name] lay still for a while his O2 saturation drops to about 83% which is very low.  Jeremiah [name] was placed on 2 L nasal cannula doing well.  Chest x-ray shows persistently elevated right hemidiaphragm but no acute findings per radiology.  D-dimer was sent and it is age-adjusted negative.  Jeremiah [name] has no lower extremity swelling or edema.  Patient otherwise has no neurological symptoms or headache, labs show elevated creatinine concerning for AKI given setting of vomiting.  Will be admitted to the hospitalist team for AKI  and intermittent mild hypoxemia. Final Clinical Impression(s) / ED Diagnoses Final diagnoses:  AKI (acute kidney injury) ([place])  Hypoxemia  Non-intractable vomiting with nausea, unspecified vomiting type    Rx / DC Orders ED Discharge Orders    None       Luna Fuse, MD 08/22/20 1756

## 2020-08-23 ENCOUNTER — Observation Stay (HOSPITAL_COMMUNITY): Payer: Medicare Other

## 2020-08-23 DIAGNOSIS — E440 Moderate protein-calorie malnutrition: Secondary | ICD-10-CM | POA: Insufficient documentation

## 2020-08-23 DIAGNOSIS — R197 Diarrhea, unspecified: Secondary | ICD-10-CM | POA: Diagnosis not present

## 2020-08-23 DIAGNOSIS — E860 Dehydration: Secondary | ICD-10-CM | POA: Diagnosis present

## 2020-08-23 DIAGNOSIS — Z8249 Family history of ischemic heart disease and other diseases of the circulatory system: Secondary | ICD-10-CM | POA: Diagnosis not present

## 2020-08-23 DIAGNOSIS — Z923 Personal history of irradiation: Secondary | ICD-10-CM | POA: Diagnosis not present

## 2020-08-23 DIAGNOSIS — E119 Type 2 diabetes mellitus without complications: Secondary | ICD-10-CM | POA: Diagnosis not present

## 2020-08-23 DIAGNOSIS — Z87891 Personal history of nicotine dependence: Secondary | ICD-10-CM | POA: Diagnosis not present

## 2020-08-23 DIAGNOSIS — K56600 Partial intestinal obstruction, unspecified as to cause: Secondary | ICD-10-CM | POA: Diagnosis not present

## 2020-08-23 DIAGNOSIS — K5939 Other megacolon: Secondary | ICD-10-CM | POA: Diagnosis not present

## 2020-08-23 DIAGNOSIS — Z85828 Personal history of other malignant neoplasm of skin: Secondary | ICD-10-CM | POA: Diagnosis not present

## 2020-08-23 DIAGNOSIS — I10 Essential (primary) hypertension: Secondary | ICD-10-CM | POA: Diagnosis present

## 2020-08-23 DIAGNOSIS — Z8546 Personal history of malignant neoplasm of prostate: Secondary | ICD-10-CM | POA: Diagnosis not present

## 2020-08-23 DIAGNOSIS — E785 Hyperlipidemia, unspecified: Secondary | ICD-10-CM | POA: Diagnosis present

## 2020-08-23 DIAGNOSIS — K8020 Calculus of gallbladder without cholecystitis without obstruction: Secondary | ICD-10-CM | POA: Diagnosis not present

## 2020-08-23 DIAGNOSIS — K6389 Other specified diseases of intestine: Secondary | ICD-10-CM | POA: Diagnosis not present

## 2020-08-23 DIAGNOSIS — I714 Abdominal aortic aneurysm, without rupture: Secondary | ICD-10-CM | POA: Diagnosis not present

## 2020-08-23 DIAGNOSIS — I712 Thoracic aortic aneurysm, without rupture: Secondary | ICD-10-CM | POA: Diagnosis not present

## 2020-08-23 DIAGNOSIS — G4733 Obstructive sleep apnea (adult) (pediatric): Secondary | ICD-10-CM | POA: Diagnosis present

## 2020-08-23 DIAGNOSIS — K56609 Unspecified intestinal obstruction, unspecified as to partial versus complete obstruction: Secondary | ICD-10-CM | POA: Diagnosis not present

## 2020-08-23 DIAGNOSIS — Z982 Presence of cerebrospinal fluid drainage device: Secondary | ICD-10-CM | POA: Diagnosis not present

## 2020-08-23 DIAGNOSIS — Z6826 Body mass index (BMI) 26.0-26.9, adult: Secondary | ICD-10-CM | POA: Diagnosis not present

## 2020-08-23 DIAGNOSIS — R1110 Vomiting, unspecified: Secondary | ICD-10-CM | POA: Diagnosis not present

## 2020-08-23 DIAGNOSIS — Z801 Family history of malignant neoplasm of trachea, bronchus and lung: Secondary | ICD-10-CM | POA: Diagnosis not present

## 2020-08-23 DIAGNOSIS — N179 Acute kidney failure, unspecified: Secondary | ICD-10-CM | POA: Diagnosis not present

## 2020-08-23 DIAGNOSIS — R112 Nausea with vomiting, unspecified: Secondary | ICD-10-CM | POA: Diagnosis not present

## 2020-08-23 DIAGNOSIS — Z66 Do not resuscitate: Secondary | ICD-10-CM | POA: Diagnosis present

## 2020-08-23 DIAGNOSIS — Z20822 Contact with and (suspected) exposure to covid-19: Secondary | ICD-10-CM | POA: Diagnosis present

## 2020-08-23 DIAGNOSIS — K56699 Other intestinal obstruction unspecified as to partial versus complete obstruction: Secondary | ICD-10-CM | POA: Diagnosis not present

## 2020-08-23 DIAGNOSIS — Z833 Family history of diabetes mellitus: Secondary | ICD-10-CM | POA: Diagnosis not present

## 2020-08-23 DIAGNOSIS — J9601 Acute respiratory failure with hypoxia: Secondary | ICD-10-CM | POA: Diagnosis not present

## 2020-08-23 DIAGNOSIS — N400 Enlarged prostate without lower urinary tract symptoms: Secondary | ICD-10-CM | POA: Diagnosis present

## 2020-08-23 DIAGNOSIS — G912 (Idiopathic) normal pressure hydrocephalus: Secondary | ICD-10-CM | POA: Diagnosis present

## 2020-08-23 DIAGNOSIS — K5730 Diverticulosis of large intestine without perforation or abscess without bleeding: Secondary | ICD-10-CM | POA: Diagnosis not present

## 2020-08-23 DIAGNOSIS — Z888 Allergy status to other drugs, medicaments and biological substances status: Secondary | ICD-10-CM | POA: Diagnosis not present

## 2020-08-23 LAB — CBC WITH DIFFERENTIAL/PLATELET
Abs Immature Granulocytes: 0.05 10*3/uL (ref 0.00–0.07)
Basophils Absolute: 0 10*3/uL (ref 0.0–0.1)
Basophils Relative: 1 %
Eosinophils Absolute: 0.1 10*3/uL (ref 0.0–0.5)
Eosinophils Relative: 1 %
HCT: 52.9 % — ABNORMAL HIGH (ref 39.0–52.0)
Hemoglobin: 16.9 g/dL (ref 13.0–17.0)
Immature Granulocytes: 1 %
Lymphocytes Relative: 13 %
Lymphs Abs: 1 10*3/uL (ref 0.7–4.0)
MCH: 31.9 pg (ref 26.0–34.0)
MCHC: 31.9 g/dL (ref 30.0–36.0)
MCV: 99.8 fL (ref 80.0–100.0)
Monocytes Absolute: 1 10*3/uL (ref 0.1–1.0)
Monocytes Relative: 13 %
Neutro Abs: 5.7 10*3/uL (ref 1.7–7.7)
Neutrophils Relative %: 71 %
Platelets: 186 10*3/uL (ref 150–400)
RBC: 5.3 MIL/uL (ref 4.22–5.81)
RDW: 13.8 % (ref 11.5–15.5)
WBC: 7.9 10*3/uL (ref 4.0–10.5)
nRBC: 0 % (ref 0.0–0.2)

## 2020-08-23 LAB — CBC
HCT: 54 % — ABNORMAL HIGH (ref 39.0–52.0)
Hemoglobin: 17.1 g/dL — ABNORMAL HIGH (ref 13.0–17.0)
MCH: 31.5 pg (ref 26.0–34.0)
MCHC: 31.7 g/dL (ref 30.0–36.0)
MCV: 99.4 fL (ref 80.0–100.0)
Platelets: 183 10*3/uL (ref 150–400)
RBC: 5.43 MIL/uL (ref 4.22–5.81)
RDW: 13.8 % (ref 11.5–15.5)
WBC: 8 10*3/uL (ref 4.0–10.5)
nRBC: 0 % (ref 0.0–0.2)

## 2020-08-23 LAB — COMPREHENSIVE METABOLIC PANEL
ALT: 25 U/L (ref 0–44)
ALT: 25 U/L (ref 0–44)
AST: 39 U/L (ref 15–41)
AST: 40 U/L (ref 15–41)
Albumin: 3.5 g/dL (ref 3.5–5.0)
Albumin: 3.5 g/dL (ref 3.5–5.0)
Alkaline Phosphatase: 56 U/L (ref 38–126)
Alkaline Phosphatase: 58 U/L (ref 38–126)
Anion gap: 10 (ref 5–15)
Anion gap: 12 (ref 5–15)
BUN: 49 mg/dL — ABNORMAL HIGH (ref 8–23)
BUN: 50 mg/dL — ABNORMAL HIGH (ref 8–23)
CO2: 30 mmol/L (ref 22–32)
CO2: 31 mmol/L (ref 22–32)
Calcium: 8.8 mg/dL — ABNORMAL LOW (ref 8.9–10.3)
Calcium: 9 mg/dL (ref 8.9–10.3)
Chloride: 103 mmol/L (ref 98–111)
Chloride: 103 mmol/L (ref 98–111)
Creatinine, Ser: 2.43 mg/dL — ABNORMAL HIGH (ref 0.61–1.24)
Creatinine, Ser: 2.48 mg/dL — ABNORMAL HIGH (ref 0.61–1.24)
GFR, Estimated: 26 mL/min — ABNORMAL LOW (ref >60)
GFR, Estimated: 26 mL/min — ABNORMAL LOW (ref >60)
Glucose, Bld: 122 mg/dL — ABNORMAL HIGH (ref 70–99)
Glucose, Bld: 123 mg/dL — ABNORMAL HIGH (ref 70–99)
Potassium: 4 mmol/L (ref 3.5–5.1)
Potassium: 4 mmol/L (ref 3.5–5.1)
Sodium: 144 mmol/L (ref 135–145)
Sodium: 145 mmol/L (ref 135–145)
Total Bilirubin: 1.3 mg/dL — ABNORMAL HIGH (ref 0.3–1.2)
Total Bilirubin: 1.3 mg/dL — ABNORMAL HIGH (ref 0.3–1.2)
Total Protein: 6.6 g/dL (ref 6.5–8.1)
Total Protein: 6.7 g/dL (ref 6.5–8.1)

## 2020-08-23 LAB — PHOSPHORUS
Phosphorus: 4.5 mg/dL (ref 2.5–4.6)
Phosphorus: 4.5 mg/dL (ref 2.5–4.6)

## 2020-08-23 LAB — MAGNESIUM
Magnesium: 2 mg/dL (ref 1.7–2.4)
Magnesium: 2 mg/dL (ref 1.7–2.4)

## 2020-08-23 LAB — HEMOGLOBIN A1C
Hgb A1c MFr Bld: 6.6 % — ABNORMAL HIGH (ref 4.8–5.6)
Mean Plasma Glucose: 142.72 mg/dL

## 2020-08-23 LAB — LACTIC ACID, PLASMA: Lactic Acid, Venous: 1.4 mmol/L (ref 0.5–1.9)

## 2020-08-23 LAB — CK: Total CK: 358 U/L (ref 49–397)

## 2020-08-23 LAB — TSH: TSH: 1.478 u[IU]/mL (ref 0.350–4.500)

## 2020-08-23 LAB — LIPASE, BLOOD: Lipase: 37 U/L (ref 11–51)

## 2020-08-23 MED ORDER — MELATONIN 5 MG PO TABS
10.0000 mg | ORAL_TABLET | Freq: Every evening | ORAL | Status: DC | PRN
  Administered 2020-08-24: 10 mg via ORAL
  Filled 2020-08-23 (×2): qty 2

## 2020-08-23 MED ORDER — ENSURE ENLIVE PO LIQD: 237.0000 mL | Freq: Two times a day (BID) | ORAL | Status: DC

## 2020-08-23 MED ORDER — ASPIRIN EC 81 MG PO TBEC
81.0000 mg | DELAYED_RELEASE_TABLET | Freq: Every day | ORAL | Status: DC
  Administered 2020-08-24 – 2020-08-25 (×2): 81 mg via ORAL
  Filled 2020-08-23 (×2): qty 1

## 2020-08-23 MED ORDER — ACETAMINOPHEN 650 MG RE SUPP: 650.0000 mg | Freq: Four times a day (QID) | RECTAL | Status: DC | PRN

## 2020-08-23 MED ORDER — HYDRALAZINE HCL 20 MG/ML IJ SOLN: 10.0000 mg | INTRAMUSCULAR | Status: DC | PRN

## 2020-08-23 MED ORDER — ADULT MULTIVITAMIN W/MINERALS CH
1.0000 | ORAL_TABLET | Freq: Every day | ORAL | Status: DC
  Administered 2020-08-24 – 2020-08-25 (×2): 1 via ORAL
  Filled 2020-08-23 (×2): qty 1

## 2020-08-23 MED ORDER — BOOST / RESOURCE BREEZE PO LIQD CUSTOM
1.0000 | Freq: Three times a day (TID) | ORAL | Status: DC
  Administered 2020-08-24 – 2020-08-25 (×4): 1 via ORAL

## 2020-08-23 MED ORDER — ONDANSETRON HCL 4 MG PO TABS: 4.0000 mg | ORAL_TABLET | Freq: Four times a day (QID) | ORAL | Status: DC | PRN

## 2020-08-23 MED ORDER — ACETAMINOPHEN 325 MG PO TABS: 650.0000 mg | ORAL_TABLET | Freq: Four times a day (QID) | ORAL | Status: DC | PRN

## 2020-08-23 MED ORDER — DIATRIZOATE MEGLUMINE & SODIUM 66-10 % PO SOLN
90.0000 mL | Freq: Once | ORAL | Status: AC
  Administered 2020-08-23: 90 mL via ORAL
  Filled 2020-08-23: qty 90

## 2020-08-23 MED ORDER — LABETALOL HCL 5 MG/ML IV SOLN: 10.0000 mg | INTRAVENOUS | Status: DC | PRN

## 2020-08-23 MED ORDER — HYDROCODONE-ACETAMINOPHEN 5-325 MG PO TABS
1.0000 | ORAL_TABLET | ORAL | Status: DC | PRN
  Administered 2020-08-25: 2 via ORAL
  Filled 2020-08-23: qty 2

## 2020-08-23 MED ORDER — SODIUM CHLORIDE 0.9 % IV SOLN: INTRAVENOUS | Status: DC

## 2020-08-23 MED ORDER — ONDANSETRON HCL 4 MG/2ML IJ SOLN: 4.0000 mg | Freq: Four times a day (QID) | INTRAMUSCULAR | Status: DC | PRN

## 2020-08-23 NOTE — Assessment & Plan Note (Addendum)
- ?  gastroenteritis; follow up GI panel; sxms less concerning for cdiff at this time -History of complication with VPS shunt in 2021 requiring removal.  No other known abdominal surgeries.  Patient presents with nausea/vomiting but no complaints of abdominal pain.  CT abdomen/pelvis shows concern for possible developing early SBO versus ileus.  Currently his vomiting has improved and he is without NG tube -General surgery consult appreciated.  Serial abdominal imaging ordered -DC IV fluids for now as started on clear liquid diet.  Will advance as able

## 2020-08-23 NOTE — Assessment & Plan Note (Addendum)
-   BP stable; home med on hold for now - if elevates will treat with IV PRN or resume home med if tolerating PO

## 2020-08-23 NOTE — Evaluation (Signed)
Physical Therapy Evaluation Patient Details Name: Jeremiah [name] MRN: [id number] DOB: 1940/10/22 Today's Date: 08/23/2020   History of Present Illness  Pt adm 4/3 with 4 days of n/v/d and fatigue. Noted to be hypoxic on RA with SpO2 75%. Pt thought to have partial SBO.  PMH:  NPH sp VP shunt, DM2, sleep apnea on CPAP, BPH, HLD HTN prostate CA status post radiation therapy, AAA  Clinical Impression  Pt presents to PT with slightly unsteady gait due to illness and inactivity. Expect pt will make good progress back to baseline with mobility. Will follow acutely but doubt pt will need PT after DC.   Pt on 3L of O2 in supine. Removed O2. SpO2 89-90% on RA sitting EOB. SpO2 89% after amb and quickly incr to 93-94% after sitting. No dyspnea. Pt tolerated activity well on RA.     Follow Up Recommendations No PT follow up    Equipment Recommendations  None recommended by PT    Recommendations for Other Services       Precautions / Restrictions Precautions Precautions: Fall;Other (comment) Precaution Comments: monitor O2      Mobility  Bed Mobility Overal bed mobility: Modified Independent Bed Mobility: Supine to Sit;Sit to Supine     Supine to sit: HOB elevated;Modified independent (Device/Increase time) Sit to supine: Modified independent (Device/Increase time)        Transfers Overall transfer level: Needs assistance Equipment used: None Transfers: Sit to/from Stand Sit to Stand: Supervision         General transfer comment: supervision for safety  Ambulation/Gait Ambulation/Gait assistance: Min guard;Supervision Gait Distance (Feet): 450 Feet Assistive device: None Gait Pattern/deviations: Decreased stride length;Drifts right/left Gait velocity: normal for age Gait velocity interpretation: >2.62 ft/sec, indicative of community ambulatory General Gait Details: slightly unsteady but no overt loss of balance  Stairs            Wheelchair Mobility     Modified Rankin (Stroke Patients Only)       Balance Overall balance assessment: Needs assistance Sitting-balance support: No upper extremity supported;Feet supported Sitting balance-Leahy Scale: Good     Standing balance support: No upper extremity supported;During functional activity Standing balance-Leahy Scale: Fair                               Pertinent Vitals/Pain Pain Assessment: No/denies pain    Home Living Family/patient expects to be discharged to:: Private residence Living Arrangements: Spouse/significant other Available Help at Discharge: Family;Available 24 hours/day (lives with wife recently diagnosed with dementia) Type of Home: House Home Access: Stairs to enter Entrance Stairs-Rails: Psychiatric nurse of Steps: 1 Home Layout: Multi-level;Able to live on main level with bedroom/bathroom Home Equipment: Shower seat;Grab bars - tub/shower;Hand held shower head;Cane - single point Additional Comments: lives with wife who was recently diagnosed with dementia. Aide assists wife (transportation to get hair done, etc)    Prior Function Level of Independence: Independent         Comments: no use of AD. Does grocery shopping, etc.     Hand Dominance   Dominant Hand: Right    Extremity/Trunk Assessment   Upper Extremity Assessment Upper Extremity Assessment: Defer to OT evaluation    Lower Extremity Assessment Lower Extremity Assessment: Overall WFL for tasks assessed    Cervical / Trunk Assessment Cervical / Trunk Assessment: Normal  Communication   Communication: No difficulties  Cognition Arousal/Alertness: Awake/alert Behavior During Therapy: [facility] for  tasks assessed/performed Overall Cognitive Status: Within Functional Limits for tasks assessed                                        General Comments General comments (skin integrity, edema, etc.): Pt on 3L of O2 in supine. Removed O2. SpO2 89-90%  on RA sitting EOB. SpO2 89% after amb and quickly incr to 93-94% after sitting. No dyspnea.    Exercises     Assessment/Plan    PT Assessment Patient needs continued PT services  PT Problem List Decreased balance;Decreased mobility       PT Treatment Interventions Gait training;Functional mobility training;Therapeutic activities;Therapeutic exercise;Balance training;Patient/family education    PT Goals (Current goals can be found in the Care Plan section)  Acute Rehab PT Goals Patient Stated Goal: get home to wife PT Goal Formulation: With patient Time For Goal Achievement: 08/30/20 Potential to Achieve Goals: Good    Frequency Min 3X/week   Barriers to discharge        Co-evaluation               AM-PAC PT "6 Clicks" Mobility  Outcome Measure Help needed turning from your back to your side while in a flat bed without using bedrails?: None Help needed moving from lying on your back to sitting on the side of a flat bed without using bedrails?: None Help needed moving to and from a bed to a chair (including a wheelchair)?: None Help needed standing up from a chair using your arms (e.g., wheelchair or bedside chair)?: A Little Help needed to walk in hospital room?: A Little Help needed climbing 3-5 steps with a railing? : A Little 6 Click Score: 21    End of Session   Activity Tolerance: Patient tolerated treatment well Patient left: in bed;with call bell/phone within reach;with family/visitor present;with nursing/sitter in room Nurse Communication: Mobility status;Other (comment) (O2 left off) PT Visit Diagnosis: Unsteadiness on feet (R26.81)    Time: 8466-5993 PT Time Calculation (min) (ACUTE ONLY): 22 min   Charges:   PT Evaluation $PT Eval Low Complexity: [place] Pager [phone] Office [place] 08/23/2020, 1:33 PM

## 2020-08-23 NOTE — Assessment & Plan Note (Addendum)
-   transient need for O2 - watch while hospitalized in case of ongoing need -Weaned down to room air

## 2020-08-23 NOTE — Progress Notes (Signed)
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  He had no complaints of abdominal pain on admission. He underwent CT abdomen/pelvis on admission which showed concern for a possible developing partial SBO versus ileus.  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   Interval History:  Seen this am resting comfortably.  He had some confusion this morning if he was supposed to be drinking the contrast bedside.  Otherwise endorses that his vomiting seems to have improved some since admission.  Denies any abdominal pain. Reviewed his CT findings with him and he has also been seen by surgery already this morning.  ROS: Constitutional: negative for chills and fevers, Respiratory: negative for cough, Cardiovascular: negative for chest pain and Gastrointestinal: negative for abdominal pain  Assessment & Plan: * Nausea & vomiting -History of complication with VPS shunt in 2021 requiring removal.  No other known abdominal surgeries.  Patient presents with nausea/vomiting but no complaints of abdominal pain.  CT abdomen/pelvis shows concern for possible developing early SBO versus ileus.  Currently his vomiting has improved and he is without NG tube -General surgery consult appreciated.  Follow-up Gastrografin study - keep NPO, continue IVF  Acute kidney injury ([place]) - baseline creatinine ~ 1.1 - patient presents with increase in creat >0.3 mg/dL above baseline, creat increase >1.5x baseline presumed to have occurred within past 7 days PTA - creat 2.07 on admission - suspected pre-renal for now but some worsening of renal function after IVF overnight; only received oral contrast no IV contrast on admission  - continue IVF and trend  BMP - follow up FeNa   Acute respiratory failure with hypoxia ([place]) - transient need for O2 - watch while hospitalized in case of ongoing need - currently on 3L  DMII (diabetes mellitus, type 2) (HCC) - A1c 6.6% - follow CBGs q6h; if start to elevate will start SSI  Presence of ventriculopleural shunt - hx NPH s/p shunt on 08/20/19. Previously had VPS that caused erosion into duodenum and was removed and followed by placement of ventriculopleural shunt -Followed by neurosurgery at [facility]  OSA on CPAP - continue qhs CPAP  HTN (hypertension) - BP stable; home med on hold for now while NPO - if elevates will treat with IV PRN  HLD (hyperlipidemia) - no statin seen on med rec and given age, likely no benefit    Old records reviewed in assessment of this patient  Antimicrobials: n/a  DVT prophylaxis: SCDs Start: 08/23/20 0022   Code Status:   Code Status: DNR Family Communication:   Disposition Plan: Status is: Inpatient  Remains inpatient appropriate because:Ongoing diagnostic testing needed not appropriate for outpatient work up, IV treatments appropriate due to intensity of illness or inability to take PO and Inpatient level of care appropriate due to severity of illness   Dispo: The patient is from: Home              Anticipated d/c is to: Home              Patient currently is not medically stable to d/c.   Difficult to place patient No   Risk of unplanned readmission score:     Objective: Blood pressure 123/67, pulse  86, temperature 98.7 F (37.1 C), temperature source Oral, resp. rate 17, height 5\' 10"  (1.778 m), weight 83.9 kg, SpO2 96 %.  Examination: General appearance: alert, cooperative and no distress Head: Normocephalic, without obvious abnormality, atraumatic Eyes: EOMI Lungs: clear to auscultation bilaterally Heart: regular rate and rhythm and S1, S2 normal Abdomen: obese, soft, NT, ND, mildly hypoactive BS but they are present Extremities: no  edema Skin: mobility and turgor normal Neurologic: Grossly normal  Consultants:   General surgery  Procedures:     Data Reviewed: I have personally reviewed following labs and imaging studies Results for orders placed or performed during the hospital encounter of 08/22/20 (from the past 24 hour(s))  CBC with Differential     Status: Abnormal   Collection Time: 08/22/20  3:55 PM  Result Value Ref Range   WBC 8.9 4.0 - 10.5 K/uL   RBC 5.95 (H) 4.22 - 5.81 MIL/uL   Hemoglobin 18.7 (H) 13.0 - 17.0 g/dL   HCT 57.8 (H) 39.0 - 52.0 %   MCV 97.1 80.0 - 100.0 fL   MCH 31.4 26.0 - 34.0 pg   MCHC 32.4 30.0 - 36.0 g/dL   RDW 13.7 11.5 - 15.5 %   Platelets 210 150 - 400 K/uL   nRBC 0.0 0.0 - 0.2 %   Neutrophils Relative % 83 %   Neutro Abs 7.3 1.7 - 7.7 K/uL   Lymphocytes Relative 7 %   Lymphs Abs 0.7 0.7 - 4.0 K/uL   Monocytes Relative 9 %   Monocytes Absolute 0.8 0.1 - 1.0 K/uL   Eosinophils Relative 0 %   Eosinophils Absolute 0.0 0.0 - 0.5 K/uL   Basophils Relative 0 %   Basophils Absolute 0.0 0.0 - 0.1 K/uL   Immature Granulocytes 1 %   Abs Immature Granulocytes 0.05 0.00 - 0.07 K/uL  Basic metabolic panel     Status: Abnormal   Collection Time: 08/22/20  3:55 PM  Result Value Ref Range   Sodium 144 135 - 145 mmol/L   Potassium 3.9 3.5 - 5.1 mmol/L   Chloride 97 (L) 98 - 111 mmol/L   CO2 32 22 - 32 mmol/L   Glucose, Bld 181 (H) 70 - 99 mg/dL   BUN 42 (H) 8 - 23 mg/dL   Creatinine, Ser 2.07 (H) 0.61 - 1.24 mg/dL   Calcium 9.9 8.9 - 10.3 mg/dL   GFR, Estimated 32 (L) >60 mL/min   Anion gap 15 5 - 15  Troponin I (High Sensitivity)     Status: None   Collection Time: 08/22/20  3:55 PM  Result Value Ref Range   Troponin I (High Sensitivity) 9 <18 ng/L  D-dimer, quantitative     Status: Abnormal   Collection Time: 08/22/20  3:55 PM  Result Value Ref Range   D-Dimer, Quant 0.68 (H) 0.00 - 0.50 ug/mL-FEU  Brain natriuretic peptide     Status: None   Collection Time:  08/22/20  3:56 PM  Result Value Ref Range   B Natriuretic Peptide 56.5 0.0 - 100.0 pg/mL  Resp Panel by RT-PCR (Flu A&B, Covid) Nasopharyngeal Swab     Status: None   Collection Time: 08/22/20  3:57 PM   Specimen: Nasopharyngeal Swab; Nasopharyngeal(NP) swabs in vial transport medium  Result Value Ref Range   SARS Coronavirus 2 by RT PCR NEGATIVE NEGATIVE   Influenza A by PCR NEGATIVE NEGATIVE   Influenza B by PCR NEGATIVE NEGATIVE  Troponin I (High Sensitivity)     Status:  None   Collection Time: 08/22/20  5:57 PM  Result Value Ref Range   Troponin I (High Sensitivity) 10 <18 ng/L  Hemoglobin A1c     Status: Abnormal   Collection Time: 08/23/20  7:50 AM  Result Value Ref Range   Hgb A1c MFr Bld 6.6 (H) 4.8 - 5.6 %   Mean Plasma Glucose 142.72 mg/dL  CK     Status: None   Collection Time: 08/23/20  7:50 AM  Result Value Ref Range   Total CK 358 49 - 397 U/L  Magnesium     Status: None   Collection Time: 08/23/20  7:50 AM  Result Value Ref Range   Magnesium 2.0 1.7 - 2.4 mg/dL  Phosphorus     Status: None   Collection Time: 08/23/20  7:50 AM  Result Value Ref Range   Phosphorus 4.5 2.5 - 4.6 mg/dL  Comprehensive metabolic panel     Status: Abnormal   Collection Time: 08/23/20  7:50 AM  Result Value Ref Range   Sodium 145 135 - 145 mmol/L   Potassium 4.0 3.5 - 5.1 mmol/L   Chloride 103 98 - 111 mmol/L   CO2 30 22 - 32 mmol/L   Glucose, Bld 123 (H) 70 - 99 mg/dL   BUN 49 (H) 8 - 23 mg/dL   Creatinine, Ser 2.43 (H) 0.61 - 1.24 mg/dL   Calcium 9.0 8.9 - 10.3 mg/dL   Total Protein 6.6 6.5 - 8.1 g/dL   Albumin 3.5 3.5 - 5.0 g/dL   AST 40 15 - 41 U/L   ALT 25 0 - 44 U/L   Alkaline Phosphatase 56 38 - 126 U/L   Total Bilirubin 1.3 (H) 0.3 - 1.2 mg/dL   GFR, Estimated 26 (L) >60 mL/min   Anion gap 12 5 - 15  CBC     Status: Abnormal   Collection Time: 08/23/20  7:50 AM  Result Value Ref Range   WBC 8.0 4.0 - 10.5 K/uL   RBC 5.43 4.22 - 5.81 MIL/uL   Hemoglobin 17.1 (H)  13.0 - 17.0 g/dL   HCT 54.0 (H) 39.0 - 52.0 %   MCV 99.4 80.0 - 100.0 fL   MCH 31.5 26.0 - 34.0 pg   MCHC 31.7 30.0 - 36.0 g/dL   RDW 13.8 11.5 - 15.5 %   Platelets 183 150 - 400 K/uL   nRBC 0.0 0.0 - 0.2 %  Lipase, blood     Status: None   Collection Time: 08/23/20  7:50 AM  Result Value Ref Range   Lipase 37 11 - 51 U/L  Lactic acid, plasma     Status: None   Collection Time: 08/23/20  7:50 AM  Result Value Ref Range   Lactic Acid, Venous 1.4 0.5 - 1.9 mmol/L  Magnesium     Status: None   Collection Time: 08/23/20  7:50 AM  Result Value Ref Range   Magnesium 2.0 1.7 - 2.4 mg/dL  Phosphorus     Status: None   Collection Time: 08/23/20  7:50 AM  Result Value Ref Range   Phosphorus 4.5 2.5 - 4.6 mg/dL  CBC WITH DIFFERENTIAL     Status: Abnormal   Collection Time: 08/23/20  7:50 AM  Result Value Ref Range   WBC 7.9 4.0 - 10.5 K/uL   RBC 5.30 4.22 - 5.81 MIL/uL   Hemoglobin 16.9 13.0 - 17.0 g/dL   HCT 52.9 (H) 39.0 - 52.0 %   MCV 99.8 80.0 - 100.0  fL   MCH 31.9 26.0 - 34.0 pg   MCHC 31.9 30.0 - 36.0 g/dL   RDW 13.8 11.5 - 15.5 %   Platelets 186 150 - 400 K/uL   nRBC 0.0 0.0 - 0.2 %   Neutrophils Relative % 71 %   Neutro Abs 5.7 1.7 - 7.7 K/uL   Lymphocytes Relative 13 %   Lymphs Abs 1.0 0.7 - 4.0 K/uL   Monocytes Relative 13 %   Monocytes Absolute 1.0 0.1 - 1.0 K/uL   Eosinophils Relative 1 %   Eosinophils Absolute 0.1 0.0 - 0.5 K/uL   Basophils Relative 1 %   Basophils Absolute 0.0 0.0 - 0.1 K/uL   Immature Granulocytes 1 %   Abs Immature Granulocytes 0.05 0.00 - 0.07 K/uL  TSH     Status: None   Collection Time: 08/23/20  7:50 AM  Result Value Ref Range   TSH 1.478 0.350 - 4.500 uIU/mL  Comprehensive metabolic panel     Status: Abnormal   Collection Time: 08/23/20  7:50 AM  Result Value Ref Range   Sodium 144 135 - 145 mmol/L   Potassium 4.0 3.5 - 5.1 mmol/L   Chloride 103 98 - 111 mmol/L   CO2 31 22 - 32 mmol/L   Glucose, Bld 122 (H) 70 - 99 mg/dL   BUN 50  (H) 8 - 23 mg/dL   Creatinine, Ser 2.48 (H) 0.61 - 1.24 mg/dL   Calcium 8.8 (L) 8.9 - 10.3 mg/dL   Total Protein 6.7 6.5 - 8.1 g/dL   Albumin 3.5 3.5 - 5.0 g/dL   AST 39 15 - 41 U/L   ALT 25 0 - 44 U/L   Alkaline Phosphatase 58 38 - 126 U/L   Total Bilirubin 1.3 (H) 0.3 - 1.2 mg/dL   GFR, Estimated 26 (L) >60 mL/min   Anion gap 10 5 - 15    Recent Results (from the past 240 hour(s))  Resp Panel by RT-PCR (Flu A&B, Covid) Nasopharyngeal Swab     Status: None   Collection Time: 08/22/20  3:57 PM   Specimen: Nasopharyngeal Swab; Nasopharyngeal(NP) swabs in vial transport medium  Result Value Ref Range Status   SARS Coronavirus 2 by RT PCR NEGATIVE NEGATIVE Final    Comment: (NOTE) SARS-CoV-2 target nucleic acids are NOT DETECTED.  The SARS-CoV-2 RNA is generally detectable in upper respiratory specimens during the acute phase of infection. The lowest concentration of SARS-CoV-2 viral copies this assay can detect is 138 copies/mL. A negative result does not preclude SARS-Cov-2 infection and should not be used as the sole basis for treatment or other patient management decisions. A negative result may occur with  improper specimen collection/handling, submission of specimen other than nasopharyngeal swab, presence of viral mutation(s) within the areas targeted by this assay, and inadequate number of viral copies(<138 copies/mL). A negative result must be combined with clinical observations, patient history, and epidemiological information. The expected result is Negative.  Fact Sheet for Patients:  EntrepreneurPulse.com.au  Fact Sheet for Healthcare Providers:  IncredibleEmployment.be  This test is no t yet approved or cleared by the [place] FDA and  has been authorized for detection and/or diagnosis of SARS-CoV-2 by FDA under an Emergency Use Authorization (EUA). This EUA will remain  in effect (meaning this test can be used) for the  duration of the COVID-19 declaration under Section 564(b)(1) of the Act, 21 U.S.C.section 360bbb-3(b)(1), unless the authorization is terminated  or revoked sooner.  Influenza A by PCR NEGATIVE NEGATIVE Final   Influenza B by PCR NEGATIVE NEGATIVE Final    Comment: (NOTE) The Xpert Xpress SARS-CoV-2/FLU/RSV plus assay is intended as an aid in the diagnosis of influenza from Nasopharyngeal swab specimens and should not be used as a sole basis for treatment. Nasal washings and aspirates are unacceptable for Xpert Xpress SARS-CoV-2/FLU/RSV testing.  Fact Sheet for Patients: EntrepreneurPulse.com.au  Fact Sheet for Healthcare Providers: IncredibleEmployment.be  This test is not yet approved or cleared by the [place] FDA and has been authorized for detection and/or diagnosis of SARS-CoV-2 by FDA under an Emergency Use Authorization (EUA). This EUA will remain in effect (meaning this test can be used) for the duration of the COVID-19 declaration under Section 564(b)(1) of the Act, 21 U.S.C. section 360bbb-3(b)(1), unless the authorization is terminated or revoked.  Performed at [facility]      Radiology Studies: CT ABDOMEN PELVIS WO CONTRAST  Result Date: 08/23/2020 CLINICAL DATA:  80 year old male with suspected bowel obstruction EXAM: CT ABDOMEN AND PELVIS WITHOUT CONTRAST TECHNIQUE: Multidetector CT imaging of the abdomen and pelvis was performed following the standard protocol without IV contrast. COMPARISON:  Prior CT abdomen/pelvis 03/28/2019 FINDINGS: Lower chest: Chronic elevation of the right hemidiaphragm with associated chronic atelectasis versus scarring. Hepatobiliary: Colonic interposition. High attenuation material layers dependently within the gallbladder consistent with cholelithiasis. No intra or extrahepatic biliary ductal dilatation. No discrete hepatic lesion. Pancreas: Unremarkable. No pancreatic  ductal dilatation or surrounding inflammatory changes. Spleen: Normal in size without focal abnormality. Adrenals/Urinary Tract: Normal adrenal glands. Multifocal right-sided nephrolithiasis. At least 4 stones are identified in the right renal collecting system. No stones on the left. No hydronephrosis. Unremarkable ureters and bladder. Stomach/Bowel: Colonic diverticular disease without CT evidence of active inflammation. Colonic interposition is noted with relatively redundant transverse colon and hepatic flexure anterior to the liver. Focal dilation of several loops of jejunum in the right upper quadrant with a gradual transition to normal caliber. No focal transition point. No associated mesenteric edema or surrounding inflammatory changes. Vascular/Lymphatic: Limited evaluation in the absence of intravenous contrast. Calcifications present along the abdominal aorta. Focal abdominal aortic aneurysm with maximal dimensions of 3.8 cm. No suspicious lymphadenopathy. Reproductive: Brachytherapy seeds scattered throughout the prostate gland. Other: No abdominal wall hernia or abnormality. No abdominopelvic ascites. Musculoskeletal: No acute osseous abnormality. Surgical changes of prior posterior lumbar interbody fusion at L4-L5 with interbody graft. Grade 1 anterolisthesis of L4 on L5 is present. Multilevel degenerative disc disease. IMPRESSION: 1. Partial/low-grade bowel obstruction with several loops of dilated and fluid-filled jejunum in the right upper quadrant but no focal transition point or inflammatory changes. Findings may reflect localized ileus secondary to gastroenteritis versus early/developing bowel obstruction. 2. Approximately 3.8 cm abdominal aortic aneurysm. Recommend follow-up every 2 years. This recommendation follows ACR consensus guidelines: White Paper of the ACR Incidental Findings Committee II on Vascular Findings. J Am Coll Radiol 2013; 10:789-794. Aortic aneurysm NOS (ICD10-I71.9). 3.  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 4. Cholelithiasis without evidence of acute cholecystitis. 5. Colonic diverticular disease without CT evidence of active inflammation. 6.  Aortic Atherosclerosis (ICD10-170.0) Electronically Signed   By: Jacqulynn Cadet M.D.   On: 08/23/2020 06:04   DG Abd 1 View  Result Date: 08/23/2020 CLINICAL DATA:  Nausea and vomiting. EXAM: ABDOMEN - 1 VIEW COMPARISON:  June 15, 2016 FINDINGS: Multiple dilated loops of air-filled small bowel are seen throughout the  right abdomen. There is no evidence of free air. No radio-opaque calculi or other significant radiographic abnormality are seen. Bilateral radiopaque pedicle screws are noted at the levels of L5 and S1. IMPRESSION: Findings consistent with small-bowel obstruction versus ileus. Correlation with abdomen pelvis CT is recommended. Electronically Signed   By: Virgina Norfolk M.D.   On: 08/23/2020 00:30   DG Chest Port 1 View  Result Date: 08/22/2020 CLINICAL DATA:  80 year old male with nausea and vomiting. EXAM: PORTABLE CHEST 1 VIEW COMPARISON:  Chest radiograph dated 07/17/2019 FINDINGS: There is eventration of the right hemidiaphragm. There are bibasilar atelectasis. No focal consolidation, pleural effusion, pneumothorax. Borderline cardiomegaly. Atherosclerotic calcification of the aorta. No acute osseous pathology. Partially visualized right VP shunt. IMPRESSION: No active disease. Electronically Signed   By: Anner Crete M.D.   On: 08/22/2020 17:09   DG Abd Portable 1V-Small Bowel Obstruction Protocol-initial, 8 hr delay  Result Date: 08/23/2020 CLINICAL DATA:  Vomiting, small-bowel obstruction. EXAM: PORTABLE ABDOMEN - 1 VIEW COMPARISON:  August 23, 2020. FINDINGS: Stable small bowel dilatation is noted concerning for distal small bowel obstruction. No colonic dilatation is noted. IMPRESSION: Stable small bowel dilatation  concerning for distal small bowel obstruction. Electronically Signed   By: Marijo Conception M.D.   On: 08/23/2020 11:20   DG Abd Portable 1V-Small Bowel Obstruction Protocol-initial, 8 hr delay  Final Result    CT ABDOMEN PELVIS WO CONTRAST  Final Result    DG Abd 1 View  Final Result    DG Chest Port 1 View  Final Result      Scheduled Meds: . aspirin EC  81 mg Oral Daily  . diatrizoate meglumine-sodium  90 mL Oral Once  . feeding supplement  237 mL Oral BID BM   PRN Meds: acetaminophen **OR** acetaminophen, hydrALAZINE, HYDROcodone-acetaminophen, labetalol, melatonin, ondansetron **OR** ondansetron (ZOFRAN) IV Continuous Infusions: . sodium chloride 100 mL/hr at 08/23/20 0118     LOS: 0 days  Time spent: Greater than 50% of the 35 minute visit was spent in counseling/coordination of care for the patient as laid out in the A&P.   Dwyane Dee, MD [facility] 08/23/2020, 12:27 PM

## 2020-08-23 NOTE — Assessment & Plan Note (Addendum)
-   A1c 6.6% - follow CBGs q6h; if start to elevate will start SSI -Overall remaining stable

## 2020-08-23 NOTE — Evaluation (Signed)
Occupational Therapy Evaluation Patient Details Name: Jeremiah [name] MRN: [id number] DOB: 06-01-1940 Today's Date: 08/23/2020    History of Present Illness Jeremiah [name] is a 80 y.o. male with medical history significant of NPH sp VP shunt, DM2, sleep apnea on CPAP, BPH, HLD HTN prostate cancer status post radiation therapy, AAA   Clinical Impression   PTA, pt lives with spouse and reports Independence with all ADLs, IADLs and mobility without AD. Pt presents now with deficits in standing balance, endurance and cardiopulmonary tolerance. Pt overall min guard for mobility without AD, noted unsteadiness and min guard to correct one LOB though progressed with increased activity. Pt Independent for UB ADLs and Supervision for LB ADLs to ensure safety. Began education on energy conservation, O2 mgmt if pt to possibly discharge with O2 with continued reinforcement needed. Recommend HHOT follow-up if discharging with supplemental O2 vs no OT follow-up pending progress.   Pt received on 3 L O2, SpO2 95% at rest. After activity, 1/4 DOE and SpO2 92% on 3 L O2.     Follow Up Recommendations  Home health OT;Supervision - Intermittent (pending need for continued supplemental O2)    Equipment Recommendations  Other (comment) (to be determined pending progress)    Recommendations for Other Services       Precautions / Restrictions Precautions Precautions: Fall;Other (comment) Precaution Comments: monitor O2 Restrictions Weight Bearing Restrictions: No      Mobility Bed Mobility Overal bed mobility: Modified Independent Bed Mobility: Supine to Sit     Supine to sit: HOB elevated;Modified independent (Device/Increase time)     General bed mobility comments: increased time    Transfers Overall transfer level: Needs assistance Equipment used: None Transfers: Sit to/from Stand Sit to Stand: Min guard         General transfer comment: min guard with one initial LOB with min guard to  correct without use of AD. intermittent reaching to IV pole for support    Balance Overall balance assessment: Needs assistance Sitting-balance support: No upper extremity supported;Feet supported Sitting balance-Leahy Scale: Good     Standing balance support: No upper extremity supported;During functional activity Standing balance-Leahy Scale: Fair Standing balance comment: fair static standing, limitations noted with dynamic tasks without UE support                           ADL either performed or assessed with clinical judgement   ADL Overall ADL's : Needs assistance/impaired Eating/Feeding: NPO Eating/Feeding Details (indicate cue type and reason): likely able to self feed independently Grooming: Supervision/safety;Standing;Wash/dry hands   Upper Body Bathing: Independent;Sitting   Lower Body Bathing: Sit to/from stand;Supervison/ safety   Upper Body Dressing : Independent;Sitting   Lower Body Dressing: Sit to/from stand;Supervision/safety   Toilet Transfer: Min guard;Ambulation;Regular Toilet;Grab bars Toilet Transfer Details (indicate cue type and reason): min guard with one initial LOB that improved with activity Toileting- Clothing Manipulation and Hygiene: Supervision/safety;Sit to/from stand Toileting - Clothing Manipulation Details (indicate cue type and reason): supervision for safety, able to manage clothing, etc     Functional mobility during ADLs: Min guard General ADL Comments: deficits in cardiopulmonary tolerance and balance. Began education on energy conservation, O2 mgmt at home if pt to discharge with supplemental O2.     Vision Baseline Vision/History: Wears glasses Wears Glasses: Reading only Patient Visual Report: No change from baseline Vision Assessment?: No apparent visual deficits     Perception     Praxis  Pertinent Vitals/Pain Pain Assessment: No/denies pain     Hand Dominance Right   Extremity/Trunk Assessment Upper  Extremity Assessment Upper Extremity Assessment: Overall WFL for tasks assessed   Lower Extremity Assessment Lower Extremity Assessment: Defer to PT evaluation   Cervical / Trunk Assessment Cervical / Trunk Assessment: Normal   Communication Communication Communication: No difficulties   Cognition Arousal/Alertness: Awake/alert Behavior During Therapy: WFL for tasks assessed/performed Overall Cognitive Status: Within Functional Limits for tasks assessed                                 General Comments: A&Ox4, acknowledges balance deficits, etc   General Comments  Received on 3 L O2, SpO2 95% at rest. With activity, SpO2 92% on 3 L O2. Likely able to be weaned down to 2 L O2. Lab services entering during session, left sitting EOB with lab tech for blood draws    Exercises     Shoulder Instructions      Home Living Family/patient expects to be discharged to:: Private residence Living Arrangements: Spouse/significant other Available Help at Discharge: Family;Available 24 hours/day (lives with wife recently diagnosed with dementia) Type of Home: House Home Access: Stairs to enter [employer] of Steps: 1 Entrance Stairs-Rails: Right;Left Home Layout: Multi-level;Able to live on main level with bedroom/bathroom     Bathroom Shower/Tub: Occupational psychologist: Standard     Home Equipment: Shower seat;Grab bars - tub/shower;Hand held shower head;Cane - single point   Additional Comments: lives with wife who was recently diagnosed with dementia. Aide assists wife (transportation to get hair done, etc)      Prior Functioning/Environment Level of Independence: Independent        Comments: no use of AD. Does grocery shopping, etc.        OT Problem List: Decreased activity tolerance;Impaired balance (sitting and/or standing);Cardiopulmonary status limiting activity;Decreased knowledge of use of DME or AE      OT  Treatment/Interventions: Self-care/ADL training;Therapeutic exercise;Energy conservation;DME and/or AE instruction;Therapeutic activities;Patient/family education;Balance training    OT Goals(Current goals can be found in the care plan section) Acute Rehab OT Goals Patient Stated Goal: get home to wife OT Goal Formulation: With patient Time For Goal Achievement: 09/06/20 Potential to Achieve Goals: Good ADL Goals Pt Will Perform Grooming: with modified independence;standing Pt Will Perform Lower Body Dressing: with modified independence;sitting/lateral leans;sit to/from stand Pt Will Transfer to Toilet: with modified independence Pt/caregiver will Perform Home Exercise Program: Increased strength;Both right and left upper extremity;With theraband;Independently;With written HEP provided Additional ADL Goal #1: Pt to verbalize at least 3 energy conservation handouts to implement during ADLs/IADLs Additional ADL Goal #2: Pt to increase activity tolerance > 7 min during ADLs/mobility  OT Frequency: Min 2X/week   Barriers to D/C:            Co-evaluation              AM-PAC OT "6 Clicks" Daily Activity     Outcome Measure Help from another person eating meals?: Total (NPO) Help from another person taking care of personal grooming?: A Little Help from another person toileting, which includes using toliet, bedpan, or urinal?: A Little Help from another person bathing (including washing, rinsing, drying)?: A Little Help from another person to put on and taking off regular upper body clothing?: None Help from another person to put on and taking off regular lower body clothing?: A Little 6 Click  Score: 17   End of Session Equipment Utilized During Treatment: Gait belt;Oxygen Nurse Communication: Mobility status;Other (comment) (O2)  Activity Tolerance:   Patient left: in bed;with call bell/phone within reach;Other (comment) (sittting EOB with lab tech)  OT Visit Diagnosis:  Unsteadiness on feet (R26.81);Other abnormalities of gait and mobility (R26.89)                Time: 9211-9417 OT Time Calculation (min): 24 min Charges:  OT General Charges $OT Visit: 1 Visit OT Evaluation $OT Eval Low Complexity: 1 Low OT Treatments $Self Care/Home Management : 8-22 mins  Malachy Chamber, OTR/L Acute Rehab Services Office: [phone]  Layla Maw 08/23/2020, 8:00 AM

## 2020-08-23 NOTE — Assessment & Plan Note (Addendum)
-   baseline creatinine ~ 1.1 - patient presents with increase in creat >0.3 mg/dL above baseline, creat increase >1.5x baseline presumed to have occurred within past 7 days PTA - creat 2.07 on admission -Started on fluids on admission with some improvement in renal function on 08/24/2020 -Will allow rest from fluids for now and continue clear liquid diet.  IV access was leaking - BMP in am - follow up FeNa; urine never collected

## 2020-08-23 NOTE — Assessment & Plan Note (Signed)
-   no statin seen on med rec and given age, likely no benefit

## 2020-08-23 NOTE — Hospital Course (Addendum)
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  He had no complaints of abdominal pain on admission. He underwent CT abdomen/pelvis on admission which showed concern for a possible developing partial SBO versus ileus.  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  He was evaluated by surgery and was already slowly improving.  He was started on trial of clear liquid diet on 08/24/2020.

## 2020-08-23 NOTE — Assessment & Plan Note (Signed)
-   hx NPH s/p shunt on 08/20/19. 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

## 2020-08-23 NOTE — Consult Note (Signed)
[facility] Surgery Consult Note  Jeremiah [name] 29-Mar-1941  [id number].    Requesting MD: Sabino Gasser, MD Chief Complaint/Reason for Consult: pSBO vs ileus  HPI:  Mr. Jeremiah [name] is a pleasant 80 y/o M W/ and PMH hypertension, type 2 diabetes, diverticulosis, prostate cancer (s/p radiotherapy), OSA (non-complaint with CPAP), and NPH s/p VP shunt (see more detailed surgical history below) who presented to [facility] with a cc 4 days of nausea, vomiting, and fatigue. He reports nausea and 3-4 episodes of daily emesis started on Thursday and persisted through the weekend. He reports cessation of BMs on Thursday and is unsure the last time he had flatus. He denies associated abdominal pain. Denies similar sxs in the past. Denies fever, chills, sick contacts, new urinary sxs, hematochezia, melena. He denies emesis since hospital admission. He had his first 2 BMs this am that were liquid and non-bloody. Denies flatus. Patient denies tobacco or alcohol use. Denies use of blood thinners. At baseline he lives with his wife who has dementia and he is independent of ADLs. Reports unsteady gate lately but recently saw his neurosurgeon at [facility] who confirmed his VP shunt is working appropriately.  Pt has a history of ventriculoperitoneal shunt placement for NPH 05/2018 at [facility] Per chart review, on 07/17/2019 he presented to [facility] due to fall and AMS found to have erosion of VP shunt catheter into duodenal lumen with anterograde direction toward ligament of Treitz. He initially underwent shunt externalization, then complete VP shunt removal on 07/23/2019 followed by 10 days IV abx for bacteremia and SNF placement. He subsequently had a ventriculopleural shunt placed at [facility] 08/20/2019.   ED workup included a CT abdomen pelvis concerning for low grade pSBO in the RUQ and CCS was asked to see. He also has an AKI (Cr. 2.43, was 0.80 06/2019)  ROS: As above  Review of Systems  All other  systems reviewed and are negative.  Family History  Problem Relation Age of Onset  . Heart attack Father 57  . Cancer Father        prostate  . Diabetes Father   . Cancer Paternal Uncle        prostate  . Dementia Mother 24  . Lung cancer Brother     Past Medical History:  Diagnosis Date  . BPH (benign prostatic hyperplasia)   . Central retinal artery occlusion   . Diabetes mellitus without complication (HCC)    diet controlled  . Diverticulosis   . History of kidney stones   . Hyperlipidemia   . Hypertension   . OSA on CPAP    wears cpap  . Osteoarthritis   . 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 S/P radiation therapy 04/23/13 - 05/29/14   Prostate/seminal vesicles, external beam 4500 cGy in 25 sessions  . Seasonal allergies   . Skin cancer    scalp  . Wears glasses     Past Surgical History:  Procedure Laterality Date  . BACK SURGERY  2009  . COLONOSCOPY W/ BIOPSIES AND POLYPECTOMY    . [place]  . LEFT HEART CATH AND CORONARY ANGIOGRAPHY N/A 03/14/2019   Procedure: LEFT HEART CATH AND CORONARY ANGIOGRAPHY;  Surgeon: [name], MD;  Location: [facility];  Service: Cardiovascular;  Laterality: N/A;  . PROSTATE BIOPSY  2013, 2014, 01/2014   Gleason 7  . RADIOACTIVE SEED IMPLANT N/A 07/01/2014   Procedure:  RADIOACTIVE SEED IMPLANT;  Surgeon: [name], MD;  Location: [facility];  Service: Urology;  Laterality: N/A;  DR PORTABLE  . TONSILLECTOMY    . 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   Procedure: SHUNT INSERTION VENTRICULAR-PERITONEAL;  Surgeon: [name], MD;  Location: [facility];  Service: Neurosurgery;  Laterality: N/A;  SHUNT INSERTION VENTRICULAR-PERITONEAL    Social History:  reports that he quit smoking about 59 years ago. His smoking use included cigarettes. He has never used smokeless tobacco. He reports current alcohol use of about 1.0 standard drink of alcohol per week. He  reports that he does not use drugs.  Allergies: No Known Allergies  Medications Prior to Admission  Medication Sig Dispense Refill  . acetaminophen (TYLENOL) 500 MG tablet Take 1,000 mg by mouth at bedtime.    . Amino Acids-Protein Hydrolys (FEEDING SUPPLEMENT, PRO-STAT SUGAR FREE 64,) LIQD Take 30 mLs by mouth daily. 887 mL 0  . amLODipine (NORVASC) 5 MG tablet Take 1 tablet (5 mg total) by mouth daily. 30 tablet 0  . Apoaequorin (PREVAGEN EXTRA STRENGTH) 20 MG CAPS Take 20-40 mg by mouth daily.     [name] Kitchen aspirin EC 81 MG tablet Take 81 mg by mouth daily.     . feeding supplement, ENSURE ENLIVE, (ENSURE ENLIVE) LIQD Take 237 mLs by mouth 2 (two) times daily between meals. 237 mL 12  . Melatonin 10 MG CAPS Take 10 mg by mouth at bedtime as needed (for sleep).     . Multiple Vitamin (MULTIVITAMIN WITH MINERALS) TABS tablet Take 1 tablet by mouth daily.    . Multiple Vitamins-Minerals (PRESERVISION AREDS 2 PO) Take 1 capsule by mouth 2 (two) times daily.     . pantoprazole (PROTONIX) 20 MG tablet Take 20 mg by mouth daily before breakfast.     . potassium chloride (K-DUR) 10 MEQ tablet Take 10 mEq by mouth 2 (two) times daily.   2  . senna-docusate (SENOKOT-S) 8.6-50 MG tablet Take 1 tablet by mouth daily.    . vitamin B-12 (CYANOCOBALAMIN) 250 MCG tablet Take 250 mcg by mouth daily.      Blood pressure 131/80, pulse 88, temperature 98.4 F (36.9 C), resp. rate 18, height 5\' 10"  (1.778 m), weight 83.9 kg, SpO2 (!) 88 %. Physical Exam: Constitutional: NAD; conversant; no deformities Eyes: Moist conjunctiva; no lid lag; anicteric; PERRL Neck: Trachea midline; no thyromegaly Lungs: Normal respiratory effort; no tactile fremitus CV: RRR; no palpable thrills; no pitting edema GI: Abd soft, obese, non-tender, mild distention; +BS, no palpable hepatosplenomegaly; well-healed surgical scar in the RUQ. MSK: symmetrical; no clubbing/cyanosis Psychiatric: Appropriate affect; alert and oriented  x3 Lymphatic: No palpable cervical or axillary lymphadenopathy Neuro: A&Ox3, action tremor of RUE  Results for orders placed or performed during the hospital encounter of 08/22/20 (from the past 48 hour(s))  CBC with Differential     Status: Abnormal   Collection Time: 08/22/20  3:55 PM  Result Value Ref Range   WBC 8.9 4.0 - 10.5 K/uL   RBC 5.95 (H) 4.22 - 5.81 MIL/uL   Hemoglobin 18.7 (H) 13.0 - 17.0 g/dL   HCT 57.8 (H) 39.0 - 52.0 %   MCV 97.1 80.0 - 100.0 fL   MCH 31.4 26.0 - 34.0 pg   MCHC 32.4 30.0 - 36.0 g/dL   RDW 13.7 11.5 - 15.5 %   Platelets 210 150 - 400 K/uL   nRBC 0.0 0.0 - 0.2 %   Neutrophils Relative % 83 %  Neutro Abs 7.3 1.7 - 7.7 K/uL   Lymphocytes Relative 7 %   Lymphs Abs 0.7 0.7 - 4.0 K/uL   Monocytes Relative 9 %   Monocytes Absolute 0.8 0.1 - 1.0 K/uL   Eosinophils Relative 0 %   Eosinophils Absolute 0.0 0.0 - 0.5 K/uL   Basophils Relative 0 %   Basophils Absolute 0.0 0.0 - 0.1 K/uL   Immature Granulocytes 1 %   Abs Immature Granulocytes 0.05 0.00 - 0.07 K/uL    Comment: Performed at [facility]  Basic metabolic panel     Status: Abnormal   Collection Time: 08/22/20  3:55 PM  Result Value Ref Range   Sodium 144 135 - 145 mmol/L   Potassium 3.9 3.5 - 5.1 mmol/L   Chloride 97 (L) 98 - 111 mmol/L   CO2 32 22 - 32 mmol/L   Glucose, Bld 181 (H) 70 - 99 mg/dL    Comment: Glucose reference range applies only to samples taken after fasting for at least 8 hours.   BUN 42 (H) 8 - 23 mg/dL   Creatinine, Ser 2.07 (H) 0.61 - 1.24 mg/dL   Calcium 9.9 8.9 - 10.3 mg/dL   GFR, Estimated 32 (L) >60 mL/min    Comment: (NOTE) Calculated using the CKD-EPI Creatinine Equation (2021)    Anion gap 15 5 - 15    Comment: Performed at [facility]  Troponin I (High Sensitivity)     Status: None   Collection Time: 08/22/20  3:55 PM  Result Value Ref Range   Troponin I (High Sensitivity) 9 <18 ng/L    Comment: (NOTE) Elevated high  sensitivity troponin I (hsTnI) values and significant  changes across serial measurements may suggest ACS but many other  chronic and acute conditions are known to elevate hsTnI results.  Refer to the "Links" section for chest pain algorithms and additional  guidance. Performed at [facility]   D-dimer, quantitative     Status: Abnormal   Collection Time: 08/22/20  3:55 PM  Result Value Ref Range   D-Dimer, Quant 0.68 (H) 0.00 - 0.50 ug/mL-FEU    Comment: (NOTE) At the manufacturer cut-off value of 0.5 g/mL FEU, this assay has a negative predictive value of 95-100%.This assay is intended for use in conjunction with a clinical pretest probability (PTP) assessment model to exclude pulmonary embolism (PE) and deep venous thrombosis (DVT) in outpatients suspected of PE or DVT. Results should be correlated with clinical presentation. Performed at [facility]   Brain natriuretic peptide     Status: None   Collection Time: 08/22/20  3:56 PM  Result Value Ref Range   B Natriuretic Peptide 56.5 0.0 - 100.0 pg/mL    Comment: Performed at [facility]  Resp Panel by RT-PCR (Flu A&B, Covid) Nasopharyngeal Swab     Status: None   Collection Time: 08/22/20  3:57 PM   Specimen: Nasopharyngeal Swab; Nasopharyngeal(NP) swabs in vial transport medium  Result Value Ref Range   SARS Coronavirus 2 by RT PCR NEGATIVE NEGATIVE    Comment: (NOTE) SARS-CoV-2 target nucleic acids are NOT DETECTED.  The SARS-CoV-2 RNA is generally detectable in upper respiratory specimens during the acute phase of infection. The lowest concentration of SARS-CoV-2 viral copies this assay can detect is 138 copies/mL. A negative result does not preclude SARS-Cov-2 infection and should not be used as the sole basis for treatment or other patient management decisions. A negative result may  occur with  improper specimen collection/handling, submission of specimen  other than nasopharyngeal swab, presence of viral mutation(s) within the areas targeted by this assay, and inadequate number of viral copies(<138 copies/mL). A negative result must be combined with clinical observations, patient history, and epidemiological information. The expected result is Negative.  Fact Sheet for Patients:  EntrepreneurPulse.com.au  Fact Sheet for Healthcare Providers:  IncredibleEmployment.be  This test is no t yet approved or cleared by the [place] FDA and  has been authorized for detection and/or diagnosis of SARS-CoV-2 by FDA under an Emergency Use Authorization (EUA). This EUA will remain  in effect (meaning this test can be used) for the duration of the COVID-19 declaration under Section 564(b)(1) of the Act, 21 U.S.C.section 360bbb-3(b)(1), unless the authorization is terminated  or revoked sooner.       Influenza A by PCR NEGATIVE NEGATIVE   Influenza B by PCR NEGATIVE NEGATIVE    Comment: (NOTE) The Xpert Xpress SARS-CoV-2/FLU/RSV plus assay is intended as an aid in the diagnosis of influenza from Nasopharyngeal swab specimens and should not be used as a sole basis for treatment. Nasal washings and aspirates are unacceptable for Xpert Xpress SARS-CoV-2/FLU/RSV testing.  Fact Sheet for Patients: EntrepreneurPulse.com.au  Fact Sheet for Healthcare Providers: IncredibleEmployment.be  This test is not yet approved or cleared by the [place] FDA and has been authorized for detection and/or diagnosis of SARS-CoV-2 by FDA under an Emergency Use Authorization (EUA). This EUA will remain in effect (meaning this test can be used) for the duration of the COVID-19 declaration under Section 564(b)(1) of the Act, 21 U.S.C. section 360bbb-3(b)(1), unless the authorization is terminated or revoked.  Performed at [facility]   Troponin I (High Sensitivity)      Status: None   Collection Time: 08/22/20  5:57 PM  Result Value Ref Range   Troponin I (High Sensitivity) 10 <18 ng/L    Comment: (NOTE) Elevated high sensitivity troponin I (hsTnI) values and significant  changes across serial measurements may suggest ACS but many other  chronic and acute conditions are known to elevate hsTnI results.  Refer to the "Links" section for chest pain algorithms and additional  guidance. Performed at [facility]   Hemoglobin A1c     Status: Abnormal   Collection Time: 08/23/20  7:50 AM  Result Value Ref Range   Hgb A1c MFr Bld 6.6 (H) 4.8 - 5.6 %    Comment: (NOTE) Pre diabetes:          5.7%-6.4%  Diabetes:              >6.4%  Glycemic control for   <7.0% adults with diabetes    Mean Plasma Glucose 142.72 mg/dL    Comment: Performed at [facility] [address], [place], [place] [address]  CBC     Status: Abnormal   Collection Time: 08/23/20  7:50 AM  Result Value Ref Range   WBC 8.0 4.0 - 10.5 K/uL   RBC 5.43 4.22 - 5.81 MIL/uL   Hemoglobin 17.1 (H) 13.0 - 17.0 g/dL   HCT 54.0 (H) 39.0 - 52.0 %   MCV 99.4 80.0 - 100.0 fL   MCH 31.5 26.0 - 34.0 pg   MCHC 31.7 30.0 - 36.0 g/dL   RDW 13.8 11.5 - 15.5 %   Platelets 183 150 - 400 K/uL   nRBC 0.0 0.0 - 0.2 %    Comment: Performed at [facility],  [address], [place], [place] [address]  Lactic acid, plasma     Status: None   Collection Time: 08/23/20  7:50 AM  Result Value Ref Range   Lactic Acid, Venous 1.4 0.5 - 1.9 mmol/L    Comment: Performed at [facility] [address], [place], [place] [address]  Magnesium     Status: None   Collection Time: 08/23/20  7:50 AM  Result Value Ref Range   Magnesium 2.0 1.7 - 2.4 mg/dL    Comment: Performed at [facility] [address], [place], [place] [address]  Phosphorus     Status: None   Collection Time: 08/23/20  7:50 AM  Result Value Ref Range   Phosphorus 4.5 2.5 - 4.6 mg/dL    Comment:  Performed at [facility] [address] [place], [place] [address]  CBC WITH DIFFERENTIAL     Status: Abnormal   Collection Time: 08/23/20  7:50 AM  Result Value Ref Range   WBC 7.9 4.0 - 10.5 K/uL   RBC 5.30 4.22 - 5.81 MIL/uL   Hemoglobin 16.9 13.0 - 17.0 g/dL   HCT 52.9 (H) 39.0 - 52.0 %   MCV 99.8 80.0 - 100.0 fL   MCH 31.9 26.0 - 34.0 pg   MCHC 31.9 30.0 - 36.0 g/dL   RDW 13.8 11.5 - 15.5 %   Platelets 186 150 - 400 K/uL   nRBC 0.0 0.0 - 0.2 %   Neutrophils Relative % 71 %   Neutro Abs 5.7 1.7 - 7.7 K/uL   Lymphocytes Relative 13 %   Lymphs Abs 1.0 0.7 - 4.0 K/uL   Monocytes Relative 13 %   Monocytes Absolute 1.0 0.1 - 1.0 K/uL   Eosinophils Relative 1 %   Eosinophils Absolute 0.1 0.0 - 0.5 K/uL   Basophils Relative 1 %   Basophils Absolute 0.0 0.0 - 0.1 K/uL   Immature Granulocytes 1 %   Abs Immature Granulocytes 0.05 0.00 - 0.07 K/uL    Comment: Performed at [facility] [address], [place], [place] [address]  Comprehensive metabolic panel     Status: Abnormal   Collection Time: 08/23/20  7:50 AM  Result Value Ref Range   Sodium 144 135 - 145 mmol/L   Potassium 4.0 3.5 - 5.1 mmol/L   Chloride 103 98 - 111 mmol/L   CO2 31 22 - 32 mmol/L   Glucose, Bld 122 (H) 70 - 99 mg/dL    Comment: Glucose reference range applies only to samples taken after fasting for at least 8 hours.   BUN 50 (H) 8 - 23 mg/dL   Creatinine, Ser 2.48 (H) 0.61 - 1.24 mg/dL   Calcium 8.8 (L) 8.9 - 10.3 mg/dL   Total Protein 6.7 6.5 - 8.1 g/dL   Albumin 3.5 3.5 - 5.0 g/dL   AST 39 15 - 41 U/L   ALT 25 0 - 44 U/L   Alkaline Phosphatase 58 38 - 126 U/L   Total Bilirubin 1.3 (H) 0.3 - 1.2 mg/dL   GFR, Estimated 26 (L) >60 mL/min    Comment: (NOTE) Calculated using the CKD-EPI Creatinine Equation (2021)    Anion gap 10 5 - 15    Comment: Performed at [facility] [address], [place], [place] [address]   CT ABDOMEN PELVIS WO CONTRAST  Result Date:  08/23/2020 CLINICAL DATA:  80 year old male with suspected bowel obstruction EXAM: CT ABDOMEN AND PELVIS WITHOUT  CONTRAST TECHNIQUE: Multidetector CT imaging of the abdomen and pelvis was performed following the standard protocol without IV contrast. COMPARISON:  Prior CT abdomen/pelvis 03/28/2019 FINDINGS: Lower chest: Chronic elevation of the right hemidiaphragm with associated chronic atelectasis versus scarring. Hepatobiliary: Colonic interposition. High attenuation material layers dependently within the gallbladder consistent with cholelithiasis. No intra or extrahepatic biliary ductal dilatation. No discrete hepatic lesion. Pancreas: Unremarkable. No pancreatic ductal dilatation or surrounding inflammatory changes. Spleen: Normal in size without focal abnormality. Adrenals/Urinary Tract: Normal adrenal glands. Multifocal right-sided nephrolithiasis. At least 4 stones are identified in the right renal collecting system. No stones on the left. No hydronephrosis. Unremarkable ureters and bladder. Stomach/Bowel: Colonic diverticular disease without CT evidence of active inflammation. Colonic interposition is noted with relatively redundant transverse colon and hepatic flexure anterior to the liver. Focal dilation of several loops of jejunum in the right upper quadrant with a gradual transition to normal caliber. No focal transition point. No associated mesenteric edema or surrounding inflammatory changes. Vascular/Lymphatic: Limited evaluation in the absence of intravenous contrast. Calcifications present along the abdominal aorta. Focal abdominal aortic aneurysm with maximal dimensions of 3.8 cm. No suspicious lymphadenopathy. Reproductive: Brachytherapy seeds scattered throughout the prostate gland. Other: No abdominal wall hernia or abnormality. No abdominopelvic ascites. Musculoskeletal: No acute osseous abnormality. Surgical changes of prior posterior lumbar interbody fusion at L4-L5 with interbody graft.  Grade 1 anterolisthesis of L4 on L5 is present. Multilevel degenerative disc disease. IMPRESSION: 1. Partial/low-grade bowel obstruction with several loops of dilated and fluid-filled jejunum in the right upper quadrant but no focal transition point or inflammatory changes. Findings may reflect localized ileus secondary to gastroenteritis versus early/developing bowel obstruction. 2. Approximately 3.8 cm abdominal aortic aneurysm. Recommend follow-up every 2 years. This recommendation follows ACR consensus guidelines: White Paper of the ACR Incidental Findings Committee II on Vascular Findings. J Am Coll Radiol 2013; 10:789-794. Aortic aneurysm NOS (ICD10-I71.9). 3. 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 4. Cholelithiasis without evidence of acute cholecystitis. 5. Colonic diverticular disease without CT evidence of active inflammation. 6.  Aortic Atherosclerosis (ICD10-170.0) Electronically Signed   By: Jacqulynn Cadet M.D.   On: 08/23/2020 06:04   DG Abd 1 View  Result Date: 08/23/2020 CLINICAL DATA:  Nausea and vomiting. EXAM: ABDOMEN - 1 VIEW COMPARISON:  June 15, 2016 FINDINGS: Multiple dilated loops of air-filled small bowel are seen throughout the right abdomen. There is no evidence of free air. No radio-opaque calculi or other significant radiographic abnormality are seen. Bilateral radiopaque pedicle screws are noted at the levels of L5 and S1. IMPRESSION: Findings consistent with small-bowel obstruction versus ileus. Correlation with abdomen pelvis CT is recommended. Electronically Signed   By: Virgina Norfolk M.D.   On: 08/23/2020 00:30   DG Chest Port 1 View  Result Date: 08/22/2020 CLINICAL DATA:  80 year old male with nausea and vomiting. EXAM: PORTABLE CHEST 1 VIEW COMPARISON:  Chest radiograph dated 07/17/2019 FINDINGS: There is eventration of the right hemidiaphragm. There are bibasilar atelectasis. No focal  consolidation, pleural effusion, pneumothorax. Borderline cardiomegaly. Atherosclerotic calcification of the aorta. No acute osseous pathology. Partially visualized right VP shunt. IMPRESSION: No active disease. Electronically Signed   By: Anner Crete M.D.   On: 08/22/2020 17:09    Assessment/Plan Hypertension  type 2 diabetes  diverticulosis prostate cancer (s/p radiotherapy)  OSA (non-complaint with CPAP) NPH s/p ventriculoperitoneal shunt 06/2018, VP shunt removal 07/2019, ventriculopleural shunt placement 07/2019  pSBO - pt with  sxs of pSBO already clinically improving (nausea/emesis resolved, having liquid stools)  - would hold off on NG tube decompression and do small bowel protocol with PO gastrografin today. If develops nausea, vomiting, worsening distention then would place nasogastric tube to LIWS but if he continues to clinically improve then he may be able to advance diet tomorrow 4/5.  CCS will follow.   Jill Alexanders, PA-C [facility] Please see Amion for pager number during day hours 7:00am-4:30pm 08/23/2020, 9:53 AM

## 2020-08-23 NOTE — Assessment & Plan Note (Signed)
-   continue qhs CPAP 

## 2020-08-23 NOTE — Progress Notes (Signed)
Initial Nutrition Assessment  DOCUMENTATION CODES:  Non-severe (moderate) malnutrition in context of chronic illness  INTERVENTION:  If unable to advance diet in 24-48 hrs, consider starting TPN given that pt is malnourished.  Advance diet as medically able if possible.  If medically able, add Boost Breeze po TID, each supplement provides 250 kcal and 9 grams of protein.  Add MVI with minerals daily.  Discontinue Ensure Enlive.  NUTRITION DIAGNOSIS:  Moderate Malnutrition related to chronic illness (T2DM, HLD) as evidenced by mild muscle depletion,mild fat depletion,moderate fat depletion,moderate muscle depletion.  GOAL:  Patient will meet greater than or equal to 90% of their needs  MONITOR:  Diet advancement,Labs  REASON FOR ASSESSMENT:  Consult Assessment of nutrition requirement/status  ASSESSMENT:  80 yo male with a PMH of NPH s/p VP shunt, T2DM, BPH, HLD, prostate cancer s/p radiation therapy, and AAA who presents with N/V, acute respiratory failure with hypoxia, and AKI.   Small bowel obstruction per surgery and x-ray confirmed. Plan is to advance diet tomorrow. Holding off on NG tube decompression and doing small bowel protocol with PO gastrografin today.   If diet not advanced in 24-48 hours, recommend starting TPN given malnutrition.  Spoke with pt, wife, and daughter at bedside. Pt in very good spirits. He reports feeling hungry for the first time in a few days. He reports he had been vomiting since Thursday and has been unable to keep anything down. He reports that he had been eating pretty well before he got sick - eating out a lot with wife and on occasion, wife cooks.  He denies any recent weight loss. Per Epic, weight has been steadily stable and recently increased a bit.  On exam, pt does have some depletions. Some to do with age, most likely, but likely some depletion given chronic illness. Pt very excited to try supplements, as he is hungry and eager for  diet advancement.  Relevant Medications: IV fluids @ 100 ml/hr Labs: reviewed; CBG 102-181 HbA1c: 6.6% (08/2020)  NUTRITION - FOCUSED PHYSICAL EXAM: Flowsheet Row Most Recent Value  Orbital Region Mild depletion  Upper Arm Region Moderate depletion  Thoracic and Lumbar Region No depletion  Buccal Region Mild depletion  Temple Region Moderate depletion  Clavicle Bone Region Mild depletion  Clavicle and Acromion Bone Region Mild depletion  Scapular Bone Region Mild depletion  Dorsal Hand Mild depletion  Patellar Region Mild depletion  Anterior Thigh Region Mild depletion  Posterior Calf Region Mild depletion  Edema (RD Assessment) None  Hair Reviewed  Eyes Reviewed  Mouth Reviewed  Skin Reviewed  Nails Reviewed     Diet Order:   Diet Order            Diet NPO time specified  Diet effective now                EDUCATION NEEDS:  Education needs have been addressed  Skin:  Skin Assessment: Reviewed RN Assessment  Last BM:  08/22/20  Height:  Ht Readings from Last 1 Encounters:  08/22/20 5\' 10"  (1.778 m)   Weight:  Wt Readings from Last 1 Encounters:  08/22/20 83.9 kg   Ideal Body Weight:  75.5 kg  BMI:  Body mass index is 26.54 kg/m.  Estimated Nutritional Needs:  Kcal:  1800-2000 Protein:  90-105 grams Fluid:  >1.8 L  Derrel Nip, RD, LDN Registered Dietitian After Hours/Weekend Pager # in [phone]

## 2020-08-24 ENCOUNTER — Inpatient Hospital Stay (HOSPITAL_COMMUNITY): Payer: Medicare Other

## 2020-08-24 DIAGNOSIS — R197 Diarrhea, unspecified: Secondary | ICD-10-CM

## 2020-08-24 LAB — BASIC METABOLIC PANEL
Anion gap: 11 (ref 5–15)
BUN: 50 mg/dL — ABNORMAL HIGH (ref 8–23)
CO2: 24 mmol/L (ref 22–32)
Calcium: 8.3 mg/dL — ABNORMAL LOW (ref 8.9–10.3)
Chloride: 109 mmol/L (ref 98–111)
Creatinine, Ser: 1.98 mg/dL — ABNORMAL HIGH (ref 0.61–1.24)
GFR, Estimated: 34 mL/min — ABNORMAL LOW (ref >60)
Glucose, Bld: 93 mg/dL (ref 70–99)
Potassium: 4 mmol/L (ref 3.5–5.1)
Sodium: 144 mmol/L (ref 135–145)

## 2020-08-24 LAB — CBC WITH DIFFERENTIAL/PLATELET
Abs Immature Granulocytes: 0.04 10*3/uL (ref 0.00–0.07)
Basophils Absolute: 0 10*3/uL (ref 0.0–0.1)
Basophils Relative: 0 %
Eosinophils Absolute: 0.1 10*3/uL (ref 0.0–0.5)
Eosinophils Relative: 2 %
HCT: 49 % (ref 39.0–52.0)
Hemoglobin: 15.4 g/dL (ref 13.0–17.0)
Immature Granulocytes: 1 %
Lymphocytes Relative: 15 %
Lymphs Abs: 1 10*3/uL (ref 0.7–4.0)
MCH: 31.6 pg (ref 26.0–34.0)
MCHC: 31.4 g/dL (ref 30.0–36.0)
MCV: 100.6 fL — ABNORMAL HIGH (ref 80.0–100.0)
Monocytes Absolute: 0.8 10*3/uL (ref 0.1–1.0)
Monocytes Relative: 11 %
Neutro Abs: 5 10*3/uL (ref 1.7–7.7)
Neutrophils Relative %: 71 %
Platelets: 157 10*3/uL (ref 150–400)
RBC: 4.87 MIL/uL (ref 4.22–5.81)
RDW: 13.9 % (ref 11.5–15.5)
WBC: 7 10*3/uL (ref 4.0–10.5)
nRBC: 0 % (ref 0.0–0.2)

## 2020-08-24 LAB — GASTROINTESTINAL PANEL BY PCR, STOOL (REPLACES STOOL CULTURE)

## 2020-08-24 LAB — GLUCOSE, CAPILLARY
Glucose-Capillary: 106 mg/dL — ABNORMAL HIGH (ref 70–99)
Glucose-Capillary: 171 mg/dL — ABNORMAL HIGH (ref 70–99)
Glucose-Capillary: 172 mg/dL — ABNORMAL HIGH (ref 70–99)

## 2020-08-24 LAB — MAGNESIUM: Magnesium: 1.9 mg/dL (ref 1.7–2.4)

## 2020-08-24 MED ORDER — HEPARIN SODIUM (PORCINE) 5000 UNIT/ML IJ SOLN
5000.0000 [IU] | Freq: Three times a day (TID) | INTRAMUSCULAR | Status: DC
  Administered 2020-08-24 – 2020-08-25 (×3): 5000 [IU] via SUBCUTANEOUS
  Filled 2020-08-24 (×3): qty 1

## 2020-08-24 NOTE — Progress Notes (Signed)
O2 saturation at 94% while ambulating on room air.  Paulla Fore, RN, BSN

## 2020-08-24 NOTE — Progress Notes (Addendum)
[facility] Progress Note     Subjective: CC:  Denies abd pain, nausea, vomiting. +BMs. Denies significant flatus. States that after speaking with his daughter yesterday he was reminded that he had a SBO 3-4 years ago and was admitted to [facility] long for this. It resolved with non-operative mgmt.  Objective: Vital signs in last 24 hours: Temp:  [97.8 F (36.6 C)-98.7 F (37.1 C)] 97.8 F (36.6 C) (04/05 0510) Pulse Rate:  [75-86] 75 (04/05 0510) Resp:  [17-18] 18 (04/05 0510) BP: (114-123)/(62-80) 114/80 (04/05 0510) SpO2:  [92 %-100 %] 92 % (04/05 0510) Last BM Date: 08/23/20  Intake/Output from previous day: 04/04 0701 - 04/05 0700 In: 2303.7 [P.O.:960; I.V.:1343.7] Out: -  Intake/Output this shift: No intake/output data recorded.  PE: Gen:  Alert, NAD, pleasant Card:  Regular rate and rhythm, pedal pulses 2+ BL Pulm:  Normal effort, clear to auscultation bilaterally Abd: Soft, mild mostly subjective tenderness to palpation of suprapubic region without guarding, mild distention, bowel sounds present in all 4 quadrants, no HSM Skin: warm and dry, no rashes  Psych: A&Ox3   Lab Results:  Recent Labs    08/23/20 0750 08/24/20 0211  WBC 7.9  8.0 7.0  HGB 16.9  17.1* 15.4  HCT 52.9*  54.0* 49.0  PLT 186  183 157   BMET Recent Labs    08/23/20 0750 08/24/20 0211  NA 145  144 144  K 4.0  4.0 4.0  CL 103  103 109  CO2 30  31 24   GLUCOSE 123*  122* 93  BUN 49*  50* 50*  CREATININE 2.43*  2.48* 1.98*  CALCIUM 9.0  8.8* 8.3*   PT/INR No results for input(s): LABPROT, INR in the last 72 hours. CMP     Component Value Date/Time   NA 144 08/24/2020 0211   K 4.0 08/24/2020 0211   CL 109 08/24/2020 0211   CO2 24 08/24/2020 0211   GLUCOSE 93 08/24/2020 0211   BUN 50 (H) 08/24/2020 0211   CREATININE 1.98 (H) 08/24/2020 0211   CREATININE 1.24 (H) 04/09/2018 0947   CALCIUM 8.3 (L) 08/24/2020 0211   PROT 6.6 08/23/2020 0750   PROT 6.7  08/23/2020 0750   ALBUMIN 3.5 08/23/2020 0750   ALBUMIN 3.5 08/23/2020 0750   AST 40 08/23/2020 0750   AST 39 08/23/2020 0750   ALT 25 08/23/2020 0750   ALT 25 08/23/2020 0750   ALKPHOS 56 08/23/2020 0750   ALKPHOS 58 08/23/2020 0750   BILITOT 1.3 (H) 08/23/2020 0750   BILITOT 1.3 (H) 08/23/2020 0750   GFRNONAA 34 (L) 08/24/2020 0211   GFRAA >60 07/13/2019 0622   Lipase     Component Value Date/Time   LIPASE 37 08/23/2020 0750       Studies/Results: CT ABDOMEN PELVIS WO CONTRAST  Result Date: 08/23/2020 CLINICAL DATA:  80 year old male with suspected bowel obstruction EXAM: CT ABDOMEN AND PELVIS WITHOUT CONTRAST TECHNIQUE: Multidetector CT imaging of the abdomen and pelvis was performed following the standard protocol without IV contrast. COMPARISON:  Prior CT abdomen/pelvis 03/28/2019 FINDINGS: Lower chest: Chronic elevation of the right hemidiaphragm with associated chronic atelectasis versus scarring. Hepatobiliary: Colonic interposition. High attenuation material layers dependently within the gallbladder consistent with cholelithiasis. No intra or extrahepatic biliary ductal dilatation. No discrete hepatic lesion. Pancreas: Unremarkable. No pancreatic ductal dilatation or surrounding inflammatory changes. Spleen: Normal in size without focal abnormality. Adrenals/Urinary Tract: Normal adrenal glands. Multifocal right-sided nephrolithiasis. At least 4 stones are identified in the  right renal collecting system. No stones on the left. No hydronephrosis. Unremarkable ureters and bladder. Stomach/Bowel: Colonic diverticular disease without CT evidence of active inflammation. Colonic interposition is noted with relatively redundant transverse colon and hepatic flexure anterior to the liver. Focal dilation of several loops of jejunum in the right upper quadrant with a gradual transition to normal caliber. No focal transition point. No associated mesenteric edema or surrounding inflammatory  changes. Vascular/Lymphatic: Limited evaluation in the absence of intravenous contrast. Calcifications present along the abdominal aorta. Focal abdominal aortic aneurysm with maximal dimensions of 3.8 cm. No suspicious lymphadenopathy. Reproductive: Brachytherapy seeds scattered throughout the prostate gland. Other: No abdominal wall hernia or abnormality. No abdominopelvic ascites. Musculoskeletal: No acute osseous abnormality. Surgical changes of prior posterior lumbar interbody fusion at L4-L5 with interbody graft. Grade 1 anterolisthesis of L4 on L5 is present. Multilevel degenerative disc disease. IMPRESSION: 1. Partial/low-grade bowel obstruction with several loops of dilated and fluid-filled jejunum in the right upper quadrant but no focal transition point or inflammatory changes. Findings may reflect localized ileus secondary to gastroenteritis versus early/developing bowel obstruction. 2. Approximately 3.8 cm abdominal aortic aneurysm. Recommend follow-up every 2 years. This recommendation follows ACR consensus guidelines: White Paper of the ACR Incidental Findings Committee II on Vascular Findings. J Am Coll Radiol 2013; 10:789-794. Aortic aneurysm NOS (ICD10-I71.9). 3. 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 4. Cholelithiasis without evidence of acute cholecystitis. 5. Colonic diverticular disease without CT evidence of active inflammation. 6.  Aortic Atherosclerosis (ICD10-170.0) Electronically Signed   By: Jacqulynn Cadet M.D.   On: 08/23/2020 06:04   DG Abd 1 View  Result Date: 08/23/2020 CLINICAL DATA:  Nausea and vomiting. EXAM: ABDOMEN - 1 VIEW COMPARISON:  June 15, 2016 FINDINGS: Multiple dilated loops of air-filled small bowel are seen throughout the right abdomen. There is no evidence of free air. No radio-opaque calculi or other significant radiographic abnormality are seen. Bilateral radiopaque pedicle screws  are noted at the levels of L5 and S1. IMPRESSION: Findings consistent with small-bowel obstruction versus ileus. Correlation with abdomen pelvis CT is recommended. Electronically Signed   By: Virgina Norfolk M.D.   On: 08/23/2020 00:30   DG Chest Port 1 View  Result Date: 08/22/2020 CLINICAL DATA:  80 year old male with nausea and vomiting. EXAM: PORTABLE CHEST 1 VIEW COMPARISON:  Chest radiograph dated 07/17/2019 FINDINGS: There is eventration of the right hemidiaphragm. There are bibasilar atelectasis. No focal consolidation, pleural effusion, pneumothorax. Borderline cardiomegaly. Atherosclerotic calcification of the aorta. No acute osseous pathology. Partially visualized right VP shunt. IMPRESSION: No active disease. Electronically Signed   By: Anner Crete M.D.   On: 08/22/2020 17:09   DG Abd Portable 1V-Small Bowel Obstruction Protocol-initial, 8 hr delay  Result Date: 08/23/2020 CLINICAL DATA:  Vomiting, small-bowel obstruction. EXAM: PORTABLE ABDOMEN - 1 VIEW COMPARISON:  August 23, 2020. FINDINGS: Stable small bowel dilatation is noted concerning for distal small bowel obstruction. No colonic dilatation is noted. IMPRESSION: Stable small bowel dilatation concerning for distal small bowel obstruction. Electronically Signed   By: Marijo Conception M.D.   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 - start CLD, advance as tolerated to SOFT.  - GI panel for evaluation of  enteritis is pending, will follow these results - If develops nausea, vomiting, worsening distention then would back off diet to NPO and potentially place nasogastric tube to LIWS LOS: 1 day    Obie Dredge, [facility] Surgery Please see Amion for pager number during day hours 7:00am-4:30pm

## 2020-08-24 NOTE — Progress Notes (Signed)
PROGRESS NOTE    Jeremiah [name]   OIB:[id number]  DOB: [birth year]  DOA: 08/22/2020     1  PCP: Jeremiah Infante, MD  CC: N/V  Hospital Course: Jeremiah [name] is an 80 year old male with PMH NPH s/p ventriculopleural shunt 08/20/19, OSA, HTN, HLD, DMII, BPH, OA, diverticulosis who presented to the hospital with nausea/vomiting.  He had no complaints of abdominal pain on admission. He underwent CT abdomen/pelvis on admission which showed concern for a possible developing partial SBO versus ileus.  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  He was evaluated by surgery and was already slowly improving.  He was started on trial of clear liquid diet on 08/24/2020.   Interval History:  No events overnight.  Denies any nausea or vomiting.  Has not had a further bowel movement since yesterday when he was having diarrhea. Diet being advanced to clear liquids today.  He is hopeful for going home hopefully by tomorrow but understands need for hospitalization.  ROS: Constitutional: negative for chills and fevers, Respiratory: negative for cough, Cardiovascular: negative for chest pain and Gastrointestinal: negative for abdominal pain  Assessment & Plan: * Nausea vomiting and diarrhea - ?gastroenteritis; follow up GI panel; sxms less concerning for cdiff at this time -History of complication with VPS shunt in 2021 requiring removal.  No other known abdominal surgeries.  Patient presents with nausea/vomiting but no complaints of abdominal pain.  CT abdomen/pelvis shows concern for possible developing early SBO versus ileus.  Currently his vomiting has improved and he is without NG tube -General surgery consult appreciated.  Serial abdominal imaging ordered -DC IV fluids for now as started on clear liquid diet.  Will advance as able  Acute kidney injury (Jeremiah [name]) - baseline creatinine ~ 1.1 - patient presents with increase in creat >0.3 mg/dL above baseline, creat increase >1.5x baseline presumed to have occurred within  past 7 days PTA - creat 2.07 on admission -Started on fluids on admission with some improvement in renal function on 08/24/2020 -Will allow rest from fluids for now and continue clear liquid diet.  IV access was leaking - BMP in am - follow up FeNa; urine never collected    Acute respiratory failure with hypoxia (Jeremiah [name])-resolved as of 08/24/2020 - transient need for O2 - watch while hospitalized in case of ongoing need -Weaned down to room air  DMII (diabetes mellitus, type 2) (Jeremiah [name]) - A1c 6.6% - follow CBGs q6h; if start to elevate will start SSI -Overall remaining stable  Presence of ventriculopleural shunt - hx NPH s/p shunt on 08/20/19. Previously had VPS that caused erosion into duodenum and was removed and followed by placement of ventriculopleural shunt -Followed by neurosurgery at [facility]  OSA on CPAP - continue qhs CPAP  HTN (hypertension) - BP stable; home med on hold for now - if elevates will treat with IV PRN or resume home med if tolerating PO  HLD (hyperlipidemia) - no statin seen on med rec and given age, likely no benefit    Old records reviewed in assessment of this patient  Antimicrobials: n/a  DVT prophylaxis: heparin injection 5,000 Units Start: 08/24/20 1400 SCDs Start: 08/23/20 0022   Code Status:   Code Status: DNR Family Communication:   Disposition Plan: Status is: Inpatient  Remains inpatient appropriate because:Ongoing diagnostic testing needed not appropriate for outpatient work up, IV treatments appropriate due to intensity of illness or inability to take PO and Inpatient level of care appropriate due to severity of illness   Dispo: The  patient is from: Home              Anticipated d/c is to: Home              Patient currently is not medically stable to d/c.   Difficult to place patient No   Risk of unplanned readmission score: Unplanned Admission- Pilot do not use: 20.79   Objective: Blood pressure (!) 148/86, pulse 87, temperature  98.1 F (36.7 C), temperature source Oral, resp. rate 18, height 5\' 10"  (1.778 m), weight 83.9 kg, SpO2 (!) 89 %.  Examination: General appearance: alert, cooperative and no distress Head: Normocephalic, without obvious abnormality, atraumatic Eyes: EOMI Lungs: clear to auscultation bilaterally Heart: regular rate and rhythm and S1, S2 normal Abdomen: obese, soft, NT, ND, improved breath sounds, still very mildly hypoactive Extremities: no edema Skin: mobility and turgor normal Neurologic: Grossly normal  Consultants:   General surgery  Procedures:     Data Reviewed: I have personally reviewed following labs and imaging studies Results for orders placed or performed during the hospital encounter of 08/22/20 (from the past 24 hour(s))  Basic metabolic panel     Status: Abnormal   Collection Time: 08/24/20  2:11 AM  Result Value Ref Range   Sodium 144 135 - 145 mmol/L   Potassium 4.0 3.5 - 5.1 mmol/L   Chloride 109 98 - 111 mmol/L   CO2 24 22 - 32 mmol/L   Glucose, Bld 93 70 - 99 mg/dL   BUN 50 (H) 8 - 23 mg/dL   Creatinine, Ser 1.98 (H) 0.61 - 1.24 mg/dL   Calcium 8.3 (L) 8.9 - 10.3 mg/dL   GFR, Estimated 34 (L) >60 mL/min   Anion gap 11 5 - 15  CBC with Differential/Platelet     Status: Abnormal   Collection Time: 08/24/20  2:11 AM  Result Value Ref Range   WBC 7.0 4.0 - 10.5 K/uL   RBC 4.87 4.22 - 5.81 MIL/uL   Hemoglobin 15.4 13.0 - 17.0 g/dL   HCT 49.0 39.0 - 52.0 %   MCV 100.6 (H) 80.0 - 100.0 fL   MCH 31.6 26.0 - 34.0 pg   MCHC 31.4 30.0 - 36.0 g/dL   RDW 13.9 11.5 - 15.5 %   Platelets 157 150 - 400 K/uL   nRBC 0.0 0.0 - 0.2 %   Neutrophils Relative % 71 %   Neutro Abs 5.0 1.7 - 7.7 K/uL   Lymphocytes Relative 15 %   Lymphs Abs 1.0 0.7 - 4.0 K/uL   Monocytes Relative 11 %   Monocytes Absolute 0.8 0.1 - 1.0 K/uL   Eosinophils Relative 2 %   Eosinophils Absolute 0.1 0.0 - 0.5 K/uL   Basophils Relative 0 %   Basophils Absolute 0.0 0.0 - 0.1 K/uL   Immature  Granulocytes 1 %   Abs Immature Granulocytes 0.04 0.00 - 0.07 K/uL  Magnesium     Status: None   Collection Time: 08/24/20  2:11 AM  Result Value Ref Range   Magnesium 1.9 1.7 - 2.4 mg/dL  Glucose, capillary     Status: Abnormal   Collection Time: 08/24/20  6:45 AM  Result Value Ref Range   Glucose-Capillary 106 (H) 70 - 99 mg/dL  Glucose, capillary     Status: Abnormal   Collection Time: 08/24/20 11:43 AM  Result Value Ref Range   Glucose-Capillary 171 (H) 70 - 99 mg/dL    Recent Results (from the past 240 hour(s))  Resp Panel by  RT-PCR (Flu A&B, Covid) Nasopharyngeal Swab     Status: None   Collection Time: 08/22/20  3:57 PM   Specimen: Nasopharyngeal Swab; Nasopharyngeal(NP) swabs in vial transport medium  Result Value Ref Range Status   SARS Coronavirus 2 by RT PCR NEGATIVE NEGATIVE Final    Comment: (NOTE) SARS-CoV-2 target nucleic acids are NOT DETECTED.  The SARS-CoV-2 RNA is generally detectable in upper respiratory specimens during the acute phase of infection. The lowest concentration of SARS-CoV-2 viral copies this assay can detect is 138 copies/mL. A negative result does not preclude SARS-Cov-2 infection and should not be used as the sole basis for treatment or other patient management decisions. A negative result may occur with  improper specimen collection/handling, submission of specimen other than nasopharyngeal swab, presence of viral mutation(s) within the areas targeted by this assay, and inadequate number of viral copies(<138 copies/mL). A negative result must be combined with clinical observations, patient history, and epidemiological information. The expected result is Negative.  Fact Sheet for Patients:  EntrepreneurPulse.com.au  Fact Sheet for Healthcare Providers:  IncredibleEmployment.be  This test is no t yet approved or cleared by the [place] FDA and  has been authorized for detection and/or diagnosis of  SARS-CoV-2 by FDA under an Emergency Use Authorization (EUA). This EUA will remain  in effect (meaning this test can be used) for the duration of the COVID-19 declaration under Section 564(b)(1) of the Act, 21 U.S.C.section 360bbb-3(b)(1), unless the authorization is terminated  or revoked sooner.       Influenza A by PCR NEGATIVE NEGATIVE Final   Influenza B by PCR NEGATIVE NEGATIVE Final    Comment: (NOTE) The Xpert Xpress SARS-CoV-2/FLU/RSV plus assay is intended as an aid in the diagnosis of influenza from Nasopharyngeal swab specimens and should not be used as a sole basis for treatment. Nasal washings and aspirates are unacceptable for Xpert Xpress SARS-CoV-2/FLU/RSV testing.  Fact Sheet for Patients: EntrepreneurPulse.com.au  Fact Sheet for Healthcare Providers: IncredibleEmployment.be  This test is not yet approved or cleared by the [place] FDA and has been authorized for detection and/or diagnosis of SARS-CoV-2 by FDA under an Emergency Use Authorization (EUA). This EUA will remain in effect (meaning this test can be used) for the duration of the COVID-19 declaration under Section 564(b)(1) of the Act, 21 U.S.C. section 360bbb-3(b)(1), unless the authorization is terminated or revoked.  Performed at [facility]      Radiology Studies: CT ABDOMEN PELVIS WO CONTRAST  Result Date: 08/23/2020 CLINICAL DATA:  80 year old male with suspected bowel obstruction EXAM: CT ABDOMEN AND PELVIS WITHOUT CONTRAST TECHNIQUE: Multidetector CT imaging of the abdomen and pelvis was performed following the standard protocol without IV contrast. COMPARISON:  Prior CT abdomen/pelvis 03/28/2019 FINDINGS: Lower chest: Chronic elevation of the right hemidiaphragm with associated chronic atelectasis versus scarring. Hepatobiliary: Colonic interposition. High attenuation material layers dependently within the gallbladder consistent with  cholelithiasis. No intra or extrahepatic biliary ductal dilatation. No discrete hepatic lesion. Pancreas: Unremarkable. No pancreatic ductal dilatation or surrounding inflammatory changes. Spleen: Normal in size without focal abnormality. Adrenals/Urinary Tract: Normal adrenal glands. Multifocal right-sided nephrolithiasis. At least 4 stones are identified in the right renal collecting system. No stones on the left. No hydronephrosis. Unremarkable ureters and bladder. Stomach/Bowel: Colonic diverticular disease without CT evidence of active inflammation. Colonic interposition is noted with relatively redundant transverse colon and hepatic flexure anterior to the liver. Focal dilation of several loops of jejunum in the right upper quadrant with a gradual  transition to normal caliber. No focal transition point. No associated mesenteric edema or surrounding inflammatory changes. Vascular/Lymphatic: Limited evaluation in the absence of intravenous contrast. Calcifications present along the abdominal aorta. Focal abdominal aortic aneurysm with maximal dimensions of 3.8 cm. No suspicious lymphadenopathy. Reproductive: Brachytherapy seeds scattered throughout the prostate gland. Other: No abdominal wall hernia or abnormality. No abdominopelvic ascites. Musculoskeletal: No acute osseous abnormality. Surgical changes of prior posterior lumbar interbody fusion at L4-L5 with interbody graft. Grade 1 anterolisthesis of L4 on L5 is present. Multilevel degenerative disc disease. IMPRESSION: 1. Partial/low-grade bowel obstruction with several loops of dilated and fluid-filled jejunum in the right upper quadrant but no focal transition point or inflammatory changes. Findings may reflect localized ileus secondary to gastroenteritis versus early/developing bowel obstruction. 2. Approximately 3.8 cm abdominal aortic aneurysm. Recommend follow-up every 2 years. This recommendation follows ACR consensus guidelines: White Paper of the  ACR Incidental Findings Committee II on Vascular Findings. J Am Coll Radiol 2013; 10:789-794. Aortic aneurysm NOS (ICD10-I71.9). 3. 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 4. Cholelithiasis without evidence of acute cholecystitis. 5. Colonic diverticular disease without CT evidence of active inflammation. 6.  Aortic Atherosclerosis (ICD10-170.0) Electronically Signed   By: Jacqulynn Cadet M.D.   On: 08/23/2020 06:04   DG Abd 1 View  Result Date: 08/23/2020 CLINICAL DATA:  Nausea and vomiting. EXAM: ABDOMEN - 1 VIEW COMPARISON:  June 15, 2016 FINDINGS: Multiple dilated loops of air-filled small bowel are seen throughout the right abdomen. There is no evidence of free air. No radio-opaque calculi or other significant radiographic abnormality are seen. Bilateral radiopaque pedicle screws are noted at the levels of L5 and S1. IMPRESSION: Findings consistent with small-bowel obstruction versus ileus. Correlation with abdomen pelvis CT is recommended. Electronically Signed   By: Virgina Norfolk M.D.   On: 08/23/2020 00:30   DG Chest Port 1 View  Result Date: 08/22/2020 CLINICAL DATA:  80 year old male with nausea and vomiting. EXAM: PORTABLE CHEST 1 VIEW COMPARISON:  Chest radiograph dated 07/17/2019 FINDINGS: There is eventration of the right hemidiaphragm. There are bibasilar atelectasis. No focal consolidation, pleural effusion, pneumothorax. Borderline cardiomegaly. Atherosclerotic calcification of the aorta. No acute osseous pathology. Partially visualized right VP shunt. IMPRESSION: No active disease. Electronically Signed   By: Anner Crete M.D.   On: 08/22/2020 17:09   DG Abd Portable 1V  Result Date: 08/24/2020 CLINICAL DATA:  Small-bowel obstruction, history diabetes mellitus, diverticulosis, kidney stones, prostate cancer EXAM: PORTABLE ABDOMEN - 1 VIEW COMPARISON:  Portable exam 0836 hours compared to 08/23/2020  FINDINGS: Persistent dilated small bowel loops in the RIGHT upper quadrant. No definite bowel wall thickening. Oral contrast is present throughout the distal half of colon. Diverticulosis of descending and sigmoid colon. Bones demineralized with prior lumbosacral fusion. Tubing projects over RIGHT upper quadrant. Brachytherapy seed implants at prostate gland. IMPRESSION: Persistent small bowel dilatation suspicious for small bowel obstruction. Distal colonic diverticulosis. Electronically Signed   By: Lavonia Dana M.D.   On: 08/24/2020 08:59   DG Abd Portable 1V-Small Bowel Obstruction Protocol-initial, 8 hr delay  Result Date: 08/23/2020 CLINICAL DATA:  Vomiting, small-bowel obstruction. EXAM: PORTABLE ABDOMEN - 1 VIEW COMPARISON:  August 23, 2020. FINDINGS: Stable small bowel dilatation is noted concerning for distal small bowel obstruction. No colonic dilatation is noted. IMPRESSION: Stable small bowel dilatation concerning for distal small bowel obstruction. Electronically Signed   By: Marijo Conception M.D.   On: 08/23/2020 11:20  DG Abd Portable 1V  Final Result    DG Abd Portable 1V-Small Bowel Obstruction Protocol-initial, 8 hr delay  Final Result    CT ABDOMEN PELVIS WO CONTRAST  Final Result    DG Abd 1 View  Final Result    DG Chest Port 1 View  Final Result      Scheduled Meds: . aspirin EC  81 mg Oral Daily  . feeding supplement  1 Container Oral TID BM  . heparin  5,000 Units Subcutaneous Q8H  . multivitamin with minerals  1 tablet Oral Daily   PRN Meds: acetaminophen **OR** acetaminophen, hydrALAZINE, HYDROcodone-acetaminophen, labetalol, melatonin, ondansetron **OR** ondansetron (ZOFRAN) IV Continuous Infusions:    LOS: 1 day  Time spent: Greater than 50% of the 35 minute visit was spent in counseling/coordination of care for the patient as laid out in the A&P.   Dwyane Dee, MD [facility] 08/24/2020, 12:24 PM

## 2020-08-24 NOTE — Plan of Care (Signed)
  Problem: Education: Goal: Knowledge of General Education information will improve Description Including pain rating scale, medication(s)/side effects and non-pharmacologic comfort measures Outcome: Progressing   

## 2020-08-24 NOTE — Progress Notes (Signed)
Pt doesn't want to wear CPAP for the night. 

## 2020-08-24 NOTE — TOC Initial Note (Signed)
Transition of Care [facility]) - Initial/Assessment Note    Patient Details  Name: Jeremiah [name] MRN: [id number] Date of Birth: 02-Apr-1941  Transition of Care [facility]) CM/SW Contact:    Bartholomew Crews, RN Phone Number: [phone] 08/24/2020, 3:09 PM  Clinical Narrative:                  Spoke with patient at the bedside. PTA home with spouse - stated they will celebrate 65 years in September. Reported that she has dementia and that her sister is staying with her right now. No current DME or HH, but has used [facility] in the past. Discussed recommendations for [facility] PT and OT - agreeable - referral pending with [facility]. Discussed eligibility for [place] - patient stated that he had previously "canceled" d/t feeling like he didn't need services d/t always in the doctor's office every week. Discussed coordination of care and opportunity for urgent visits in order to help keep at home and out of hospital unless medically needed. Patient agreeable to follow up with [facility]. Notified Butch Penny at [employer] to advise. Patient stated that either his son or daughter will provide transportation home at discharge. TOC following for transition needs.   Expected Discharge Plan: [place] Barriers to Discharge: Continued Medical Work up   Patient Goals and CMS Choice Patient states their goals for this hospitalization and ongoing recovery are:: home with wife CMS Medicare.gov Compare Post Acute Care list provided to:: Patient Choice offered to / list presented to : Patient  Expected Discharge Plan and Services Expected Discharge Plan: [place]   Discharge Planning Services: CM Consult Post Acute Care Choice: [place] arrangements for the past 2 months: Single Family Home                 DME Arranged: N/A DME Agency: NA       HH Arranged: PT,OT [place] Agency: [place] Date [place]: 08/24/20 Time [place]:  1509 Representative spoke with at [place]: referral pending  Prior Living Arrangements/Services Living arrangements for the past 2 months: Single Family Home Lives with:: Self,Spouse Patient language and need for interpreter reviewed:: Yes Do you feel safe going back to the place where you live?: Yes      Need for Family Participation in Patient Care: Yes (Comment) Care giver support system in place?: Yes (comment)   Criminal Activity/Legal Involvement Pertinent to Current Situation/Hospitalization: No - Comment as needed  Activities of Daily Living Home Assistive Devices/Equipment: None ADL Screening (condition at time of admission) Patient's cognitive ability adequate to safely complete daily activities?: Yes Is the patient deaf or have difficulty hearing?: No Does the patient have difficulty seeing, even when wearing glasses/contacts?: No Does the patient have difficulty concentrating, remembering, or making decisions?: No Patient able to express need for assistance with ADLs?: Yes Does the patient have difficulty dressing or bathing?: No Independently performs ADLs?: Yes (appropriate for developmental age) Does the patient have difficulty walking or climbing stairs?: No Weakness of Legs: None Weakness of Arms/Hands: None  Permission Sought/Granted                  Emotional Assessment Appearance:: Appears stated age Attitude/Demeanor/Rapport: Engaged Affect (typically observed): Accepting Orientation: : Oriented to Self,Oriented to  Time,Oriented to Place,Oriented to Situation Alcohol / Substance Use: Not Applicable Psych Involvement: No (comment)  Admission diagnosis:  Hypoxemia [R09.02] Weakness [R53.1] AKI (acute kidney injury) ([place]) [N17.9]  Intractable nausea and vomiting [R11.2] Non-intractable vomiting with nausea, unspecified vomiting type [R11.2] Nausea & vomiting [R11.2] Patient Active Problem List   Diagnosis Date Noted  . DMII (diabetes mellitus,  type 2) ([place]) 08/23/2020  . Malnutrition of moderate degree 08/23/2020  . Nausea vomiting and diarrhea 08/22/2020  . Palliative care by specialist   . Goals of care, counseling/discussion   . DNR (do not resuscitate)   . Muscular weakness   . Xerostomia   . Pneumonia 07/05/2019  . Tachycardia 07/05/2019  . AMS (altered mental status) 07/05/2019  . Other fatigue   . 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  . Small bowel obstruction ([place]) 06/10/2016  . HLD (hyperlipidemia) 06/10/2016  . HTN (hypertension) 06/10/2016  . Acute kidney injury ([place]) 06/10/2016  . Dehydration, moderate 06/10/2016  . AAA (abdominal aortic aneurysm) ([place]) 06/10/2016  . OSA on CPAP 06/10/2016  . Malignant neoplasm of prostate s/p radiation 2015 03/10/2014   PCP:  [name], MD Pharmacy:   [facility] [place] [address] Phone: [phone] Fax: [phone]     Social Determinants of Health (SDOH) Interventions    Readmission Risk Interventions No flowsheet data found.

## 2020-08-24 NOTE — Progress Notes (Signed)
Patient refused CPAP for the night  

## 2020-08-24 NOTE — TOC Progression Note (Signed)
Transition of Care [facility]) - Progression Note    Patient Details  Name: Jeremiah [name] MRN: [id number] Date of Birth: 03-24-1941  Transition [facility] Care [facility]) CM/SW Contact  Bartholomew Crews, RN Phone Number: [phone] 08/24/2020, 4:15 PM  Clinical Narrative:     Referral for [facility] PT/OT accepted by [facility]. Patient will need HH orders with [facility] for PT and OT. TOC following for transition needs.   Expected Discharge Plan: [place] Barriers to Discharge: Continued Medical Work up  Expected Discharge Plan and Services Expected Discharge Plan: [place]   Discharge Planning Services: CM Consult Post Acute Care Choice: [place] arrangements for the past 2 months: Single Family Home                 DME Arranged: N/A DME Agency: NA       HH Arranged: PT,OT [place] Agency: [place] Date [place]: 08/24/20 Time [place]: 3606 Representative spoke with at [place]: [place] ([place]) Interventions    Readmission Risk Interventions No flowsheet data found.

## 2020-08-24 NOTE — Progress Notes (Signed)
Physical Therapy Treatment Patient Details Name: ARMEND HOCHSTATTER MRN: [id number] DOB: 04-28-1941 Today's Date: 08/24/2020    History of Present Illness Pt adm 4/3 with 4 days of n/v/d and fatigue. Noted to be hypoxic on RA with SpO2 75%. Pt thought to have partial SBO.  PMH:  NPH sp VP shunt, DM2, sleep apnea on CPAP, BPH, HLD HTN prostate CA status post radiation therapy, AAA    PT Comments    Pt received in bed, cooperative and pleasant. Prior to PT session during the morning, pt walking at mod I level in hallway. During PT session, pt mod I for functional mobility. Was min guard for safety during higher level gait/balance activities but steady with no LOB. Pt reports that he is at baseline. Do not anticipate that pt needs further PT follow up. Signing off for now. Thank you for the opportunity to participate in this pt's care.   Follow Up Recommendations  No PT follow up     Equipment Recommendations  None recommended by PT    Recommendations for Other Services       Precautions / Restrictions Precautions Precautions: Fall Restrictions Weight Bearing Restrictions: No    Mobility  Bed Mobility Overal bed mobility: Modified Independent Bed Mobility: Supine to Sit;Sit to Supine     Supine to sit: HOB elevated;Modified independent (Device/Increase time) Sit to supine: Modified independent (Device/Increase time)        Transfers Overall transfer level: Modified independent Equipment used: None Transfers: Sit to/from Stand Sit to Stand: Modified independent (Device/Increase time)         General transfer comment: supervision for safety  Ambulation/Gait Ambulation/Gait assistance: Modified independent (Device/Increase time);Min guard Gait Distance (Feet): 350 Feet (350 total, 50 ft with horizontal head turns, 50 ft with vertical head turns, 50 ft weaving, 50 ft walking backwards, 150 forwards) Assistive device: None Gait Pattern/deviations: Decreased stride  length;Step-through pattern     General Gait Details: No LOB, min guard for high-level gait activites but mod I for walking forwards, decreased step length walking backwards   Stairs Stairs: Yes Stairs assistance: Modified independent (Device/Increase time) Stair Management: Alternating pattern;Step to pattern;Forwards;Two rails Number of Stairs: 2 General stair comments: Alternating pattern for ascending, step to pattern for descending, no LOB   Wheelchair Mobility    Modified Rankin (Stroke Patients Only)       Balance Overall balance assessment: Modified Independent Sitting-balance support: No upper extremity supported;Feet supported Sitting balance-Leahy Scale: Normal     Standing balance support: No upper extremity supported;During functional activity Standing balance-Leahy Scale: Good                              Cognition Arousal/Alertness: Awake/alert Behavior During Therapy: WFL for tasks assessed/performed Overall Cognitive Status: Within Functional Limits for tasks assessed                                        Exercises      General Comments        Pertinent Vitals/Pain Pain Assessment: No/denies pain    Home Living                      Prior Function            PT Goals (current goals can now be found in the care plan  section) Acute Rehab PT Goals Patient Stated Goal: get home to wife PT Goal Formulation: With patient Time For Goal Achievement: 08/30/20 Potential to Achieve Goals: Good Progress towards PT goals: Progressing toward goals    Frequency           PT Plan Frequency needs to be updated    Co-evaluation              AM-PAC PT "6 Clicks" Mobility   Outcome Measure  Help needed turning from your back to your side while in a flat bed without using bedrails?: None Help needed moving from lying on your back to sitting on the side of a flat bed without using bedrails?: None Help  needed moving to and from a bed to a chair (including a wheelchair)?: None Help needed standing up from a chair using your arms (e.g., wheelchair or bedside chair)?: None Help needed to walk in hospital room?: None Help needed climbing 3-5 steps with a railing? : None 6 Click Score: 24    End of Session Equipment Utilized During Treatment: Gait belt Activity Tolerance: Patient tolerated treatment well Patient left: with call bell/phone within reach;in bed Nurse Communication: Mobility status PT Visit Diagnosis: Unsteadiness on feet (R26.81)     Time:  -     Charges:                        Rosita Kea, SPT

## 2020-08-25 LAB — BASIC METABOLIC PANEL
Anion gap: 5 (ref 5–15)
BUN: 34 mg/dL — ABNORMAL HIGH (ref 8–23)
CO2: 28 mmol/L (ref 22–32)
Calcium: 8.3 mg/dL — ABNORMAL LOW (ref 8.9–10.3)
Chloride: 108 mmol/L (ref 98–111)
Creatinine, Ser: 1.51 mg/dL — ABNORMAL HIGH (ref 0.61–1.24)
GFR, Estimated: 46 mL/min — ABNORMAL LOW (ref >60)
Glucose, Bld: 128 mg/dL — ABNORMAL HIGH (ref 70–99)
Potassium: 3.1 mmol/L — ABNORMAL LOW (ref 3.5–5.1)
Sodium: 141 mmol/L (ref 135–145)

## 2020-08-25 LAB — GLUCOSE, CAPILLARY: Glucose-Capillary: 105 mg/dL — ABNORMAL HIGH (ref 70–99)

## 2020-08-25 LAB — CBC WITH DIFFERENTIAL/PLATELET
Abs Immature Granulocytes: 0.04 10*3/uL (ref 0.00–0.07)
Basophils Absolute: 0 10*3/uL (ref 0.0–0.1)
Basophils Relative: 0 %
Eosinophils Absolute: 0.2 10*3/uL (ref 0.0–0.5)
Eosinophils Relative: 2 %
HCT: 43.6 % (ref 39.0–52.0)
Hemoglobin: 14.3 g/dL (ref 13.0–17.0)
Immature Granulocytes: 1 %
Lymphocytes Relative: 13 %
Lymphs Abs: 0.8 10*3/uL (ref 0.7–4.0)
MCH: 32.3 pg (ref 26.0–34.0)
MCHC: 32.8 g/dL (ref 30.0–36.0)
MCV: 98.4 fL (ref 80.0–100.0)
Monocytes Absolute: 0.6 10*3/uL (ref 0.1–1.0)
Monocytes Relative: 10 %
Neutro Abs: 4.7 10*3/uL (ref 1.7–7.7)
Neutrophils Relative %: 74 %
Platelets: 147 10*3/uL — ABNORMAL LOW (ref 150–400)
RBC: 4.43 MIL/uL (ref 4.22–5.81)
RDW: 13.5 % (ref 11.5–15.5)
WBC: 6.3 10*3/uL (ref 4.0–10.5)
nRBC: 0 % (ref 0.0–0.2)

## 2020-08-25 LAB — MAGNESIUM: Magnesium: 1.8 mg/dL (ref 1.7–2.4)

## 2020-08-25 MED ORDER — POTASSIUM CHLORIDE CRYS ER 20 MEQ PO TBCR
40.0000 meq | EXTENDED_RELEASE_TABLET | Freq: Once | ORAL | Status: AC
  Administered 2020-08-25: 40 meq via ORAL
  Filled 2020-08-25: qty 2

## 2020-08-25 NOTE — Discharge Instructions (Signed)
Low-Fiber Eating Plan Fiber is found in fruits, vegetables, whole grains, and beans. Eating a diet low in fiber helps to reduce how often you have bowel movements and the amount of stool you produce. A low-fiber eating plan may help your digestive system heal if you:  Have certain conditions, such as Crohn's disease, diverticulitis, or irritable bowel syndrome (IBS), and are having a flare-up.  Recently had radiation therapy on your pelvis or bowel.  Recently had intestinal surgery or an intestinal obstruction.  Have a new surgical opening in your abdomen (colostomy or ileostomy).  Have an intestine that has narrowed (stricture).  Your health care provider will tell you how long to stay on this diet and may recommend that you work with a dietitian. What are tips for following this plan? Reading food labels  Check the nutrition facts label on food products for the amount of dietary fiber.  Choose foods that have less than 2 grams (g) of fiber per serving.   Cooking  Use white flour for baking and cooking.  Cook meat using methods that keep it tender, such as braising or poaching.  Cook eggs until the yolk is completely solid.  Cook with healthy oils, such as olive oil or canola oil. Meal planning  Eat 5-6 small meals throughout the day instead of 3 large meals.  If you are lactose intolerant: ? Choose low-lactose dairy foods. ? Do not eat dairy foods if told by your health care provider or dietitian.  Limit fats and oils to less than 8 teaspoons (39 mL) a day.  Eat small portions of desserts.  Limit acidic, spicy, or fried foods to reduce gas, bloating, and discomfort. General information  Follow instructions from your health care provider or dietitian about how much fiber you should have each day.  Most people on a low-fiber eating plan should eat less than 10 g of fiber a day. Your daily fiber goal is _________________ g.  Take vitamin and mineral supplements as told  by your health care provider or dietitian. Chewable or liquid forms are best when on this eating plan. A gummy vitamin is not recommended. What foods should I eat? Fruits Soft-cooked or canned fruits without skin and seeds. Ripe banana. Applesauce. Fruit juice without pulp. Vegetables Well-cooked or canned vegetables without skin, seeds, or stems. Cooked potatoes without skins. Vegetable juice. Grains All bread and crackers made with white flour. Waffles, pancakes, and [place] toast. Bagels. Pretzels. Melba toast, zwieback, and matzoh. Cooked and dried cereals that do not have whole grains, added fiber, seeds, or dried fruit. Domenick Gong. Hot and cold cereals made with refined corn, rice, or oats. Plain pasta and noodles. White rice. Meats and other proteins Ground meat. Tender cuts of meat or poultry. Eggs. Fish, seafood, and shellfish. Smooth nut butters. Tofu. Dairy All milk products and drinks. Lactose-free milk, including rice, soy, and almond milk. Yogurt without fruit, nuts, chocolate, or granola mixed in. Sour cream. Cottage cheese. Cheese. Fats and oils Olive oil, canola oil, sunflower oil, flaxseed oil, avocado oil, and grapeseed oil. Mayonnaise. Cream cheese. Margarine. Butter. Beverages Decaf coffee. Fruit and vegetable juices. Smoothies (in small amounts, with no pulp or skins, and with fruits from the recommended list). Sports drinks. Herbal tea. Water. Sweets and desserts Plain cakes. Cookies. Cream pies and pies made with recommended fruits. Pudding. Custard. Fruit gelatin. Sherbet. Ice pops. Ice cream without nuts. Hard candy. Honey. Jelly. Molasses. Syrups. Chocolate. Marshmallows. Gumdrops. Seasonings and condiments Ketchup. Mild mustard. Mild salad dressings.  Plain gravies. Vinegar. Spices in moderation. Salt. Sugar. Other foods Bouillon. Broth. Cream and strained soups made from recommended foods. Casseroles made with recommended foods. The items listed above may not be a  complete list of foods and beverages you can eat. Contact a dietitian for more information. What foods should I avoid? Fruits Raw or dried fruit. Berries. Fruit juice with pulp. Prune juice. Vegetables Potato skins. Raw or undercooked vegetables. All beans and bean sprouts. Cooked greens. Corn. Peas. Cabbage. Beets. Broccoli. Brussels sprouts. Cauliflower. Mushrooms. Onions. Peppers. Parsnips. Okra. Sauerkraut. Grains Whole-wheat, whole-grain, or multigrain breads, cereals, or crackers. Rye bread. Cereals with nuts, raisins, or coconut. Bran. Granola. High-fiber cereals. Cornmeal or corn bread. Whole-grain pasta. Wild or brown rice. Quinoa. Popcorn. Buckwheat. Wheat germ. Meats and other proteins Tough, fibrous meats with gristle. Fatty meat. Poultry with skin. Fried meat, Sales executive, or fish. Precooked or cured meat, such as sausages or meat loaves. Berniece Salines. Hot dogs. Nuts and chunky nut butter. Dried peas, beans, and lentils. Hummus. Dairy Yogurt with fruit, nuts, chocolate, or granola mixed in. Full-fat dairy such as whole milk, ice cream, or sour cream. Beverages Caffeinated coffee and teas. Fats and oils Avocado. Coconut. Butter. Sweets and desserts Desserts, cookies, or candies that contain nuts or coconut. Dried fruit. Jams and preserves with seeds. Marmalade. Any dessert made with fruits or grains that are not recommended. Seasonings and condiments Relish. Horseradish. Angie Fava. Olives. Other foods Corn tortilla chips. Soups made with vegetables or grains that are not recommended. The items listed above may not be a complete list of foods and beverages you should avoid. Contact a dietitian for more information. Summary  Most people on a low-fiber eating plan should eat less than 10 grams of fiber a day. Follow recommendations from your health care provider or dietitian about how much fiber you should have each day.  Always check nutrition facts labels to see the dietary fiber amount in  packaged foods. A low-fiber food will have less than 2 grams of fiber per serving.  Try to avoid whole grains, raw fruits and vegetables, dried fruit, tough cuts of meat, nuts, and seeds.  Take a vitamin and mineral supplement as told by your health care provider or dietitian. This information is not intended to replace advice given to you by your health care provider. Make sure you discuss any questions you have with your health care provider. Document Revised: 09/11/2019 Document Reviewed: 09/11/2019 Elsevier Patient Education  2021 [employer].

## 2020-08-25 NOTE — TOC Transition Note (Signed)
Transition of Care [facility]) - CM/SW Discharge Note   Patient Details  Name: MAYER VONDRAK MRN: [id number] Date of Birth: [birth year]  Transition of Care [facility]) CM/SW Contact:  Bartholomew Crews, RN Phone Number: [phone] 08/25/2020, 10:23 AM   Clinical Narrative:     Patient to transition home today. HH orders provided by MD for PT, OT. [facility] notified of transition home. No further TOC needs identified.   Final next level of care: Home w Home Health Services Barriers to Discharge: No Barriers Identified   Patient Goals and CMS Choice Patient states their goals for this hospitalization and ongoing recovery are:: home with wife CMS Medicare.gov Compare Post Acute Care list provided to:: Patient Choice offered to / list presented to : Patient  Discharge Placement                       Discharge Plan and Services   Discharge Planning Services: CM Consult Post Acute Care Choice: Home Health          DME Arranged: N/A DME Agency: NA       HH Arranged: PT,OT [place] Agency: [place] Date [facility] Agency Contacted: 08/25/20 Time [place]: [id number] Representative spoke with at [place]: [place] ([place]) Interventions     Readmission Risk Interventions No flowsheet data found.

## 2020-08-25 NOTE — Discharge Summary (Signed)
Physician Discharge Summary  Jeremiah [name] DDU:[id number] DOB: June 15, 1940 DOA: 08/22/2020  PCP: [name], MD  Admit date: 08/22/2020 Discharge date: 08/25/2020  Admitted From: home Disposition:  [place]  Recommendations for Outpatient Follow-up:  Follow up with PCP in 1-2 weeks Please obtain BMP/CBC in one week Please follow up on the following pending results:  Home Health:YES  Equipment/Devices: RW  Discharge Condition: Stable Code Status:   Code Status: DNR Diet recommendation:  Diet Order             Diet - low sodium heart healthy           DIET SOFT Room service appropriate? 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  He had no complaints of abdominal pain on admission. He underwent CT abdomen/pelvis on admission which showed concern for a possible developing partial SBO versus ileus.  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 He was evaluated by surgery and was already slowly improving.  He was started on trial of clear liquid diet on 08/24/2020. Patient has been tolerating food he has had a bowel movement x2 on 4/5-and has been tolerating soft diet. Seen by PT OT and advised home health PT OT for physical deconditioning. Patient has been tolerating diet. Requested to go home today. Was seen by surgery earlier.  Discharge Diagnoses:   Nausea vomiting and diarrhea Partial small bowel obstruction GI panel negative symptoms clinically resolved.  Gastrografin challenge 4/4 shows contrast in the colon, diet advanced tolerating well.  PS bruits resolved with nonoperative management.  Seen by surgery and okay to discharge home today with low fiber low residue diet at least couple of weeks. He will follow up with his GI.  HLD intolerant to statin HTN blood pressure  well controlled not needing home health is appears he was not taking amlodipine.  He is to follow-up with PCP.  Acute kidney injury secondary to #1.  Renal function nicely improving advised to check BMP next 5 to 6 days Recent Labs  Lab 08/22/20 1555 08/23/20 0750 08/24/20 0211 08/25/20 0312  BUN 42* 49*  50* 50* 34*  CREATININE 2.07* 2.43*  2.48* 1.98* 1.51*   OSA on CPAP Transient hypoxia pulmonary insufficiency resolved 4/5.  Doing well on room air  Type 2 diabetes A1c stable 6.7 follow-up with PCP Presence of VP shunt - hx NPH s/p shunt on 08/20/19. 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   Consults: CCS  Subjective: Alert awake resting comfortably.  Tolerating diet.  Wants to go home today.  Reports he saw the surgery team this morning.  He had a bowel movement yesterday. Discharge Exam: Vitals:   08/25/20 0800 08/25/20 0948  BP:  (!) 147/80  Pulse:  91  Resp: 18 18  Temp:  99.7 F (37.6 C)  SpO2:  95%   General: Pt is alert, awake, not in acute distress Cardiovascular: RRR, S1/S2 +, no rubs, no gallops Respiratory: CTA bilaterally, no wheezing, no rhonchi Abdominal: Soft, NT, ND, bowel sounds + Extremities: no edema, no cyanosis  Discharge Instructions  Discharge Instructions     Diet - low sodium heart healthy  Complete by: As directed    Discharge instructions   Complete by: As directed    Please call call MD or return to ER for similar or worsening recurring problem that brought you to hospital or if any fever,nausea/vomiting,abdominal pain, uncontrolled pain, chest pain,  shortness of breath or any other alarming symptoms.  You will need to check basic metabolic panel to monitor renal function in 5 to 7 days from your primary care doctor.  Please follow-up your doctor as instructed in a week time and call the office for  appointment.   Please avoid alcohol, smoking, or any other illicit substance and maintain healthy habits including taking your regular medications as prescribed.  You were cared for by a hospitalist during your hospital stay. If you have any questions about your discharge medications or the care you received while you were in the hospital after you are discharged, you can call the unit and ask to speak with the hospitalist on call if the hospitalist that took care of you is not available.  Once you are discharged, your primary care physician will handle any further medical issues. Please note that NO REFILLS for any discharge medications will be authorized once you are discharged, as it is imperative that you return to your primary care physician (or establish a relationship with a primary care physician if you do not have one) for your aftercare needs so that they can reassess your need for medications and monitor your lab values   Increase activity slowly   Complete by: As directed       Allergies as of 08/25/2020       Reactions   Lipitor [atorvastatin] Other (See Comments)   Hip pain        Medication List     STOP taking these medications    amLODipine 5 MG tablet Commonly known as: NORVASC       TAKE these medications    acetaminophen 500 MG tablet Commonly known as: TYLENOL Take 1,000 mg by mouth every 6 (six) hours as needed for mild pain.   aspirin EC 81 MG tablet Take 81 mg by mouth daily.   cholecalciferol 25 MCG (1000 UNIT) tablet Commonly known as: VITAMIN D3 Take 1,000 Units by mouth daily.   feeding supplement (PRO-STAT SUGAR FREE 64) Liqd Take 30 mLs by mouth daily.   feeding supplement Liqd Take 237 mLs by mouth 2 (two) times daily between meals.   Fiber 625 MG Tabs Take 625 mg by mouth daily at 6 (six) AM.   Melatonin 10 MG Caps Take 10 mg by mouth at bedtime as needed (for sleep).   multivitamin with minerals Tabs tablet Take 1 tablet by  mouth daily.   pantoprazole 20 MG tablet Commonly known as: PROTONIX Take 20 mg by mouth daily before breakfast.   potassium chloride 10 MEQ tablet Commonly known as: KLOR-CON Take 10 mEq by mouth 2 (two) times daily.   senna-docusate 8.6-50 MG tablet Commonly known as: Senokot-S Take 1 tablet by mouth at bedtime as needed for mild constipation.   tamsulosin 0.4 MG Caps capsule Commonly known as: FLOMAX Take 0.4 mg by mouth daily.   vitamin B-1 250 MG tablet Take 250 mg by mouth daily.   vitamin B-12 250 MCG tablet Commonly known as: CYANOCOBALAMIN Take 250 mcg by mouth daily.        Follow-up Information     [name], MD Follow up in 1 week(s).   Specialty: Internal Medicine Contact  information: [place] [address] [place], [facility] Follow up.   Specialty: Home Health Services Why: the office will call to schedule home health physical therapy visits Contact information: [address] [place] [address] [phone]                Allergies  Allergen Reactions   Lipitor [Atorvastatin] Other (See Comments)    Hip pain    The results of significant diagnostics from this hospitalization (including imaging, microbiology, ancillary and laboratory) are listed below for reference.    Microbiology: Recent Results (from the past 240 hour(s))  Resp Panel by RT-PCR (Flu A&B, Covid) Nasopharyngeal Swab     Status: None   Collection Time: 08/22/20  3:57 PM   Specimen: Nasopharyngeal Swab; Nasopharyngeal(NP) swabs in vial transport medium  Result Value Ref Range Status   SARS Coronavirus 2 by RT PCR NEGATIVE NEGATIVE Final    Comment: (NOTE) SARS-CoV-2 target nucleic acids are NOT DETECTED.  The SARS-CoV-2 RNA is generally detectable in upper respiratory specimens during the acute phase of infection. The lowest concentration of SARS-CoV-2 viral copies this assay can detect is 138  copies/mL. A negative result does not preclude SARS-Cov-2 infection and should not be used as the sole basis for treatment or other patient management decisions. A negative result may occur with  improper specimen collection/handling, submission of specimen other than nasopharyngeal swab, presence of viral mutation(s) within the areas targeted by this assay, and inadequate number of viral copies(<138 copies/mL). A negative result must be combined with clinical observations, patient history, and epidemiological information. The expected result is Negative.  Fact Sheet for Patients:  EntrepreneurPulse.com.au  Fact Sheet for Healthcare Providers:  IncredibleEmployment.be  This test is no t yet approved or cleared by the [place] FDA and  has been authorized for detection and/or diagnosis of SARS-CoV-2 by FDA under an Emergency Use Authorization (EUA). This EUA will remain  in effect (meaning this test can be used) for the duration of the COVID-19 declaration under Section 564(b)(1) of the Act, 21 U.S.C.section 360bbb-3(b)(1), unless the authorization is terminated  or revoked sooner.       Influenza A by PCR NEGATIVE NEGATIVE Final   Influenza B by PCR NEGATIVE NEGATIVE Final    Comment: (NOTE) The Xpert Xpress SARS-CoV-2/FLU/RSV plus assay is intended as an aid in the diagnosis of influenza from Nasopharyngeal swab specimens and should not be used as a sole basis for treatment. Nasal washings and aspirates are unacceptable for Xpert Xpress SARS-CoV-2/FLU/RSV testing.  Fact Sheet for Patients: EntrepreneurPulse.com.au  Fact Sheet for Healthcare Providers: IncredibleEmployment.be  This test is not yet approved or cleared by the [place] FDA and has been authorized for detection and/or diagnosis of SARS-CoV-2 by FDA under an Emergency Use Authorization (EUA). This EUA will remain in effect (meaning  this test can be used) for the duration of the COVID-19 declaration under Section 564(b)(1) of the Act, 21 U.S.C. section 360bbb-3(b)(1), unless the authorization is terminated or revoked.  Performed at [facility]   Gastrointestinal Panel by PCR , Stool     Status: None   Collection Time: 08/23/20  4:52 PM   Specimen: Stool  Result Value Ref Range Status   Campylobacter species NOT DETECTED NOT DETECTED Final   Plesimonas shigelloides NOT DETECTED NOT DETECTED Final   Salmonella species NOT DETECTED NOT DETECTED Final   Yersinia enterocolitica NOT DETECTED NOT  DETECTED Final   Vibrio species NOT DETECTED NOT DETECTED Final   Vibrio cholerae NOT DETECTED NOT DETECTED Final   Enteroaggregative E coli (EAEC) NOT DETECTED NOT DETECTED Final   Enteropathogenic E coli (EPEC) NOT DETECTED NOT DETECTED Final   Enterotoxigenic E coli (ETEC) NOT DETECTED NOT DETECTED Final   Shiga like toxin producing E coli (STEC) NOT DETECTED NOT DETECTED Final   Shigella/Enteroinvasive E coli (EIEC) NOT DETECTED NOT DETECTED Final   Cryptosporidium NOT DETECTED NOT DETECTED Final   Cyclospora cayetanensis NOT DETECTED NOT DETECTED Final   Entamoeba histolytica NOT DETECTED NOT DETECTED Final   Giardia lamblia NOT DETECTED NOT DETECTED Final   Adenovirus F40/41 NOT DETECTED NOT DETECTED Final   Astrovirus NOT DETECTED NOT DETECTED Final   Norovirus GI/GII NOT DETECTED NOT DETECTED Final   Rotavirus A NOT DETECTED NOT DETECTED Final   Sapovirus (I, II, IV, and V) NOT DETECTED NOT DETECTED Final    Comment: Performed at [facility], [place], [place] [address]    Procedures/Studies: CT ABDOMEN PELVIS WO CONTRAST  Result Date: 08/23/2020 CLINICAL DATA:  80 year old male with suspected bowel obstruction EXAM: CT ABDOMEN AND PELVIS WITHOUT CONTRAST TECHNIQUE: Multidetector CT imaging of the abdomen and pelvis was performed following the standard protocol without IV  contrast. COMPARISON:  Prior CT abdomen/pelvis 03/28/2019 FINDINGS: Lower chest: Chronic elevation of the right hemidiaphragm with associated chronic atelectasis versus scarring. Hepatobiliary: Colonic interposition. High attenuation material layers dependently within the gallbladder consistent with cholelithiasis. No intra or extrahepatic biliary ductal dilatation. No discrete hepatic lesion. Pancreas: Unremarkable. No pancreatic ductal dilatation or surrounding inflammatory changes. Spleen: Normal in size without focal abnormality. Adrenals/Urinary Tract: Normal adrenal glands. Multifocal right-sided nephrolithiasis. At least 4 stones are identified in the right renal collecting system. No stones on the left. No hydronephrosis. Unremarkable ureters and bladder. Stomach/Bowel: Colonic diverticular disease without CT evidence of active inflammation. Colonic interposition is noted with relatively redundant transverse colon and hepatic flexure anterior to the liver. Focal dilation of several loops of jejunum in the right upper quadrant with a gradual transition to normal caliber. No focal transition point. No associated mesenteric edema or surrounding inflammatory changes. Vascular/Lymphatic: Limited evaluation in the absence of intravenous contrast. Calcifications present along the abdominal aorta. Focal abdominal aortic aneurysm with maximal dimensions of 3.8 cm. No suspicious lymphadenopathy. Reproductive: Brachytherapy seeds scattered throughout the prostate gland. Other: No abdominal wall hernia or abnormality. No abdominopelvic ascites. Musculoskeletal: No acute osseous abnormality. Surgical changes of prior posterior lumbar interbody fusion at L4-L5 with interbody graft. Grade 1 anterolisthesis of L4 on L5 is present. Multilevel degenerative disc disease. IMPRESSION: 1. Partial/low-grade bowel obstruction with several loops of dilated and fluid-filled jejunum in the right upper quadrant but no focal transition  point or inflammatory changes. Findings may reflect localized ileus secondary to gastroenteritis versus early/developing bowel obstruction. 2. Approximately 3.8 cm abdominal aortic aneurysm. Recommend follow-up every 2 years. This recommendation follows ACR consensus guidelines: White Paper of the ACR Incidental Findings Committee II on Vascular Findings. J Am Coll Radiol 2013; 10:789-794. Aortic aneurysm NOS (ICD10-I71.9). 3. 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 4. Cholelithiasis without evidence of acute cholecystitis. 5. Colonic diverticular disease without CT evidence of active inflammation. 6.  Aortic Atherosclerosis (ICD10-170.0) Electronically Signed   By: Jacqulynn Cadet M.D.   On: 08/23/2020 06:04   DG Abd 1 View  Result Date: 08/23/2020 CLINICAL DATA:  Nausea and  vomiting. EXAM: ABDOMEN - 1 VIEW COMPARISON:  June 15, 2016 FINDINGS: Multiple dilated loops of air-filled small bowel are seen throughout the right abdomen. There is no evidence of free air. No radio-opaque calculi or other significant radiographic abnormality are seen. Bilateral radiopaque pedicle screws are noted at the levels of L5 and S1. IMPRESSION: Findings consistent with small-bowel obstruction versus ileus. Correlation with abdomen pelvis CT is recommended. Electronically Signed   By: Virgina Norfolk M.D.   On: 08/23/2020 00:30   DG Chest Port 1 View  Result Date: 08/22/2020 CLINICAL DATA:  80 year old male with nausea and vomiting. EXAM: PORTABLE CHEST 1 VIEW COMPARISON:  Chest radiograph dated 07/17/2019 FINDINGS: There is eventration of the right hemidiaphragm. There are bibasilar atelectasis. No focal consolidation, pleural effusion, pneumothorax. Borderline cardiomegaly. Atherosclerotic calcification of the aorta. No acute osseous pathology. Partially visualized right VP shunt. IMPRESSION: No active disease. Electronically Signed   By:  Anner Crete M.D.   On: 08/22/2020 17:09   DG Abd Portable 1V  Result Date: 08/24/2020 CLINICAL DATA:  Small-bowel obstruction, history diabetes mellitus, diverticulosis, kidney stones, prostate cancer EXAM: PORTABLE ABDOMEN - 1 VIEW COMPARISON:  Portable exam 0836 hours compared to 08/23/2020 FINDINGS: Persistent dilated small bowel loops in the RIGHT upper quadrant. No definite bowel wall thickening. Oral contrast is present throughout the distal half of colon. Diverticulosis of descending and sigmoid colon. Bones demineralized with prior lumbosacral fusion. Tubing projects over RIGHT upper quadrant. Brachytherapy seed implants at prostate gland. IMPRESSION: Persistent small bowel dilatation suspicious for small bowel obstruction. Distal colonic diverticulosis. Electronically Signed   By: Lavonia Dana M.D.   On: 08/24/2020 08:59   DG Abd Portable 1V-Small Bowel Obstruction Protocol-initial, 8 hr delay  Result Date: 08/23/2020 CLINICAL DATA:  Vomiting, small-bowel obstruction. EXAM: PORTABLE ABDOMEN - 1 VIEW COMPARISON:  August 23, 2020. FINDINGS: Stable small bowel dilatation is noted concerning for distal small bowel obstruction. No colonic dilatation is noted. IMPRESSION: Stable small bowel dilatation concerning for distal small bowel obstruction. Electronically Signed   By: Marijo Conception M.D.   On: 08/23/2020 11:20     Labs: BNP (last 3 results) Recent Labs    08/22/20 1556  BNP 62.2   Basic Metabolic Panel: Recent Labs  Lab 08/22/20 1555 08/23/20 0750 08/24/20 0211 08/25/20 0312  NA 144 145  144 144 141  K 3.9 4.0  4.0 4.0 3.1*  CL 97* 103  103 109 108  CO2 32 30  31 24 28   GLUCOSE 181* 123*  122* 93 128*  BUN 42* 49*  50* 50* 34*  CREATININE 2.07* 2.43*  2.48* 1.98* 1.51*  CALCIUM 9.9 9.0  8.8* 8.3* 8.3*  MG  --  2.0  2.0 1.9 1.8  PHOS  --  4.5  4.5  --   --    Liver Function Tests: Recent Labs  Lab 08/23/20 0750  AST 40  39  ALT 25  25  ALKPHOS 56  58   BILITOT 1.3*  1.3*  PROT 6.6  6.7  ALBUMIN 3.5  3.5   Recent Labs  Lab 08/23/20 0750  LIPASE 37   No results for input(s): AMMONIA in the last 168 hours. CBC: Recent Labs  Lab 08/22/20 1555 08/23/20 0750 08/24/20 0211 08/25/20 0312  WBC 8.9 7.9  8.0 7.0 6.3  NEUTROABS 7.3 5.7 5.0 4.7  HGB 18.7* 16.9  17.1* 15.4 14.3  HCT 57.8* 52.9*  54.0* 49.0 43.6  MCV 97.1 99.8  99.4 100.6* 98.4  PLT 210 186  183 157 147*   Cardiac Enzymes: Recent Labs  Lab 08/23/20 0750  CKTOTAL 358   BNP: Invalid input(s): POCBNP CBG: Recent Labs  Lab 08/24/20 0645 08/24/20 1143 08/24/20 2211 08/25/20 0607  GLUCAP 106* 171* 172* 105*   D-Dimer Recent Labs    08/22/20 1555  DDIMER 0.68*   Hgb A1c Recent Labs    08/23/20 0750  HGBA1C 6.6*   Lipid Profile No results for input(s): CHOL, HDL, LDLCALC, TRIG, CHOLHDL, LDLDIRECT in the last 72 hours. Thyroid function studies Recent Labs    08/23/20 0750  TSH 1.478   Anemia work up No results for input(s): VITAMINB12, FOLATE, FERRITIN, TIBC, IRON, RETICCTPCT in the last 72 hours. Urinalysis    Component Value Date/Time   COLORURINE YELLOW 07/04/2019 2332   APPEARANCEUR CLEAR 07/04/2019 2332   LABSPEC 1.014 07/04/2019 2332   PHURINE 6.0 07/04/2019 2332   GLUCOSEU 50 (A) 07/04/2019 2332   HGBUR SMALL (A) 07/04/2019 2332   BILIRUBINUR NEGATIVE 07/04/2019 2332   KETONESUR 5 (A) 07/04/2019 2332   PROTEINUR NEGATIVE 07/04/2019 2332   UROBILINOGEN 1.0 08/25/2008 0326   NITRITE NEGATIVE 07/04/2019 2332   LEUKOCYTESUR NEGATIVE 07/04/2019 2332   Sepsis Labs Invalid input(s): PROCALCITONIN,  WBC,  LACTICIDVEN Microbiology Recent Results (from the past 240 hour(s))  Resp Panel by RT-PCR (Flu A&B, Covid) Nasopharyngeal Swab     Status: None   Collection Time: 08/22/20  3:57 PM   Specimen: Nasopharyngeal Swab; Nasopharyngeal(NP) swabs in vial transport medium  Result Value Ref Range Status   SARS Coronavirus 2 by RT PCR  NEGATIVE NEGATIVE Final    Comment: (NOTE) SARS-CoV-2 target nucleic acids are NOT DETECTED.  The SARS-CoV-2 RNA is generally detectable in upper respiratory specimens during the acute phase of infection. The lowest concentration of SARS-CoV-2 viral copies this assay can detect is 138 copies/mL. A negative result does not preclude SARS-Cov-2 infection and should not be used as the sole basis for treatment or other patient management decisions. A negative result may occur with  improper specimen collection/handling, submission of specimen other than nasopharyngeal swab, presence of viral mutation(s) within the areas targeted by this assay, and inadequate number of viral copies(<138 copies/mL). A negative result must be combined with clinical observations, patient history, and epidemiological information. The expected result is Negative.  Fact Sheet for Patients:  EntrepreneurPulse.com.au  Fact Sheet for Healthcare Providers:  IncredibleEmployment.be  This test is no t yet approved or cleared by the [place] FDA and  has been authorized for detection and/or diagnosis of SARS-CoV-2 by FDA under an Emergency Use Authorization (EUA). This EUA will remain  in effect (meaning this test can be used) for the duration of the COVID-19 declaration under Section 564(b)(1) of the Act, 21 U.S.C.section 360bbb-3(b)(1), unless the authorization is terminated  or revoked sooner.       Influenza A by PCR NEGATIVE NEGATIVE Final   Influenza B by PCR NEGATIVE NEGATIVE Final    Comment: (NOTE) The Xpert Xpress SARS-CoV-2/FLU/RSV plus assay is intended as an aid in the diagnosis of influenza from Nasopharyngeal swab specimens and should not be used as a sole basis for treatment. Nasal washings and aspirates are unacceptable for Xpert Xpress SARS-CoV-2/FLU/RSV testing.  Fact Sheet for Patients: EntrepreneurPulse.com.au  Fact Sheet for  Healthcare Providers: IncredibleEmployment.be  This test is not yet approved or cleared by the [place] FDA and has been authorized for detection and/or diagnosis of SARS-CoV-2 by FDA under an Emergency Use Authorization (  EUA). This EUA will remain in effect (meaning this test can be used) for the duration of the COVID-19 declaration under Section 564(b)(1) of the Act, 21 U.S.C. section 360bbb-3(b)(1), unless the authorization is terminated or revoked.  Performed at [facility]   Gastrointestinal Panel by PCR , Stool     Status: None   Collection Time: 08/23/20  4:52 PM   Specimen: Stool  Result Value Ref Range Status   Campylobacter species NOT DETECTED NOT DETECTED Final   Plesimonas shigelloides NOT DETECTED NOT DETECTED Final   Salmonella species NOT DETECTED NOT DETECTED Final   Yersinia enterocolitica NOT DETECTED NOT DETECTED Final   Vibrio species NOT DETECTED NOT DETECTED Final   Vibrio cholerae NOT DETECTED NOT DETECTED Final   Enteroaggregative E coli (EAEC) NOT DETECTED NOT DETECTED Final   Enteropathogenic E coli (EPEC) NOT DETECTED NOT DETECTED Final   Enterotoxigenic E coli (ETEC) NOT DETECTED NOT DETECTED Final   Shiga like toxin producing E coli (STEC) NOT DETECTED NOT DETECTED Final   Shigella/Enteroinvasive E coli (EIEC) NOT DETECTED NOT DETECTED Final   Cryptosporidium NOT DETECTED NOT DETECTED Final   Cyclospora cayetanensis NOT DETECTED NOT DETECTED Final   Entamoeba histolytica NOT DETECTED NOT DETECTED Final   Giardia lamblia NOT DETECTED NOT DETECTED Final   Adenovirus F40/41 NOT DETECTED NOT DETECTED Final   Astrovirus NOT DETECTED NOT DETECTED Final   Norovirus GI/GII NOT DETECTED NOT DETECTED Final   Rotavirus A NOT DETECTED NOT DETECTED Final   Sapovirus (I, II, IV, and V) NOT DETECTED NOT DETECTED Final    Comment: Performed at [facility], [address] [place], [place] [address]     Time  coordinating discharge: 25 minutes  SIGNED: Antonieta Pert, MD  [facility] 08/25/2020, 1:26 PM  If 7PM-7AM, please contact night-coverage [email or web address]

## 2020-08-25 NOTE — Progress Notes (Signed)
[facility] Progress Note     Subjective: CC:  No new events. Tolerating PO without N/V. Denies abdominal pain. Having non-bloody BMs.  Objective: Vital signs in last 24 hours: Temp:  [98 F (36.7 C)-98.5 F (36.9 C)] 98.5 F (36.9 C) (04/06 0536) Pulse Rate:  [78-87] 78 (04/06 0536) Resp:  [16-18] 16 (04/06 0536) BP: (117-148)/(53-86) 133/79 (04/06 0536) SpO2:  [89 %-94 %] 94 % (04/06 0536) Last BM Date: 08/24/20  Intake/Output from previous day: 04/05 0701 - 04/06 0700 In: 980 [P.O.:980] Out: 0  Intake/Output this shift: No intake/output data recorded.  PE: Gen:  Alert, NAD, pleasant Card:  Regular rate and rhythm, pedal pulses 2+ BL Pulm:  Normal effort, clear to auscultation bilaterally Abd: Soft, nontender, mild distention, bowel sounds present in all 4 quadrants, no HSM Skin: warm and dry, no rashes  Psych: A&Ox3   Lab Results:  Recent Labs    08/24/20 0211 08/25/20 0312  WBC 7.0 6.3  HGB 15.4 14.3  HCT 49.0 43.6  PLT 157 147*   BMET Recent Labs    08/24/20 0211 08/25/20 0312  NA 144 141  K 4.0 3.1*  CL 109 108  CO2 24 28  GLUCOSE 93 128*  BUN 50* 34*  CREATININE 1.98* 1.51*  CALCIUM 8.3* 8.3*   PT/INR No results for input(s): LABPROT, INR in the last 72 hours. CMP     Component Value Date/Time   NA 141 08/25/2020 0312   K 3.1 (L) 08/25/2020 0312   CL 108 08/25/2020 0312   CO2 28 08/25/2020 0312   GLUCOSE 128 (H) 08/25/2020 0312   BUN 34 (H) 08/25/2020 0312   CREATININE 1.51 (H) 08/25/2020 0312   CREATININE 1.24 (H) 04/09/2018 0947   CALCIUM 8.3 (L) 08/25/2020 0312   PROT 6.6 08/23/2020 0750   PROT 6.7 08/23/2020 0750   ALBUMIN 3.5 08/23/2020 0750   ALBUMIN 3.5 08/23/2020 0750   AST 40 08/23/2020 0750   AST 39 08/23/2020 0750   ALT 25 08/23/2020 0750   ALT 25 08/23/2020 0750   ALKPHOS 56 08/23/2020 0750   ALKPHOS 58 08/23/2020 0750   BILITOT 1.3 (H) 08/23/2020 0750   BILITOT 1.3 (H) 08/23/2020 0750   GFRNONAA 46  (L) 08/25/2020 0312   GFRAA >60 07/13/2019 0622   Lipase     Component Value Date/Time   LIPASE 37 08/23/2020 0750       Studies/Results: DG Abd Portable 1V  Result Date: 08/24/2020 CLINICAL DATA:  Small-bowel obstruction, history diabetes mellitus, diverticulosis, kidney stones, prostate cancer EXAM: PORTABLE ABDOMEN - 1 VIEW COMPARISON:  Portable exam 0836 hours compared to 08/23/2020 FINDINGS: Persistent dilated small bowel loops in the RIGHT upper quadrant. No definite bowel wall thickening. Oral contrast is present throughout the distal half of colon. Diverticulosis of descending and sigmoid colon. Bones demineralized with prior lumbosacral fusion. Tubing projects over RIGHT upper quadrant. Brachytherapy seed implants at prostate gland. IMPRESSION: Persistent small bowel dilatation suspicious for small bowel obstruction. Distal colonic diverticulosis. Electronically Signed   By: Lavonia Dana M.D.   On: 08/24/2020 08:59   DG Abd Portable 1V-Small Bowel Obstruction Protocol-initial, 8 hr delay  Result Date: 08/23/2020 CLINICAL DATA:  Vomiting, small-bowel obstruction. EXAM: PORTABLE ABDOMEN - 1 VIEW COMPARISON:  August 23, 2020. FINDINGS: Stable small bowel dilatation is noted concerning for distal small bowel obstruction. No colonic dilatation is noted. IMPRESSION: Stable small bowel dilatation concerning for distal small bowel obstruction. Electronically Signed   By: Sabino Dick  Jr M.D.   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 Patient stable for discharge from [facility] perspective -- pSBO resolving with non-operative mgmt. Discussed low-fiber/low-residue diet with the patient for at least a couple of weeks as well as chewing his food well.    LOS: 2 days    Obie Dredge, [facility] Please see Amion for pager number during day hours 7:00am-4:30pm

## 2020-08-27 DIAGNOSIS — K56609 Unspecified intestinal obstruction, unspecified as to partial versus complete obstruction: Secondary | ICD-10-CM | POA: Diagnosis not present

## 2020-08-27 DIAGNOSIS — E785 Hyperlipidemia, unspecified: Secondary | ICD-10-CM | POA: Diagnosis not present

## 2020-08-27 DIAGNOSIS — I714 Abdominal aortic aneurysm, without rupture: Secondary | ICD-10-CM | POA: Diagnosis not present

## 2020-08-27 DIAGNOSIS — I1310 Hypertensive heart and chronic kidney disease without heart failure, with stage 1 through stage 4 chronic kidney disease, or unspecified chronic kidney disease: Secondary | ICD-10-CM | POA: Diagnosis not present

## 2020-08-30 DIAGNOSIS — R2689 Other abnormalities of gait and mobility: Secondary | ICD-10-CM | POA: Diagnosis not present

## 2020-08-30 DIAGNOSIS — R29898 Other symptoms and signs involving the musculoskeletal system: Secondary | ICD-10-CM | POA: Diagnosis not present

## 2020-08-30 DIAGNOSIS — Z7409 Other reduced mobility: Secondary | ICD-10-CM | POA: Diagnosis not present

## 2020-08-30 DIAGNOSIS — G912 (Idiopathic) normal pressure hydrocephalus: Secondary | ICD-10-CM | POA: Diagnosis not present

## 2020-09-27 DIAGNOSIS — E785 Hyperlipidemia, unspecified: Secondary | ICD-10-CM | POA: Diagnosis not present

## 2020-09-27 DIAGNOSIS — G912 (Idiopathic) normal pressure hydrocephalus: Secondary | ICD-10-CM | POA: Diagnosis not present

## 2020-09-27 DIAGNOSIS — I1310 Hypertensive heart and chronic kidney disease without heart failure, with stage 1 through stage 4 chronic kidney disease, or unspecified chronic kidney disease: Secondary | ICD-10-CM | POA: Diagnosis not present

## 2020-09-27 DIAGNOSIS — I714 Abdominal aortic aneurysm, without rupture: Secondary | ICD-10-CM | POA: Diagnosis not present

## 2020-10-12 DIAGNOSIS — L821 Other seborrheic keratosis: Secondary | ICD-10-CM | POA: Diagnosis not present

## 2020-10-12 DIAGNOSIS — L738 Other specified follicular disorders: Secondary | ICD-10-CM | POA: Diagnosis not present

## 2020-10-12 DIAGNOSIS — M7130 Other bursal cyst, unspecified site: Secondary | ICD-10-CM | POA: Diagnosis not present

## 2020-10-12 DIAGNOSIS — Z85828 Personal history of other malignant neoplasm of skin: Secondary | ICD-10-CM | POA: Diagnosis not present

## 2020-10-14 ENCOUNTER — Other Ambulatory Visit: Payer: Self-pay | Admitting: *Deleted

## 2020-10-14 DIAGNOSIS — I714 Abdominal aortic aneurysm, without rupture, unspecified: Secondary | ICD-10-CM

## 2020-11-30 ENCOUNTER — Ambulatory Visit: Payer: Medicare Other | Attending: Internal Medicine

## 2020-11-30 ENCOUNTER — Other Ambulatory Visit: Payer: Self-pay

## 2020-11-30 ENCOUNTER — Other Ambulatory Visit (HOSPITAL_BASED_OUTPATIENT_CLINIC_OR_DEPARTMENT_OTHER): Payer: Self-pay

## 2020-11-30 DIAGNOSIS — Z23 Encounter for immunization: Secondary | ICD-10-CM

## 2020-11-30 MED ORDER — COVID-19 MRNA VACC (MODERNA) 100 MCG/0.5ML IM SUSP
INTRAMUSCULAR | 0 refills | Status: DC
  Filled 2020-11-30: qty 0.25, 1d supply, fill #0

## 2020-11-30 NOTE — Progress Notes (Signed)
   Covid-19 Vaccination Clinic  Name:  NICKEY KLOEPFER    MRN: [id number] DOB: [birth year]  11/30/2020  Mr. Tatro was observed post Covid-19 immunization for 15 minutes without incident. He was provided with Vaccine Information Sheet and instruction to access the V-Safe system.   Mr. Corron was instructed to call 911 with any severe reactions post vaccine: Difficulty breathing  Swelling of face and throat  A fast heartbeat  A bad rash all over body  Dizziness and weakness   Immunizations Administered     Name Date Dose VIS Date Route   Moderna Covid-19 Booster Vaccine 11/30/2020 10:49 AM 0.25 mL 03/10/2020 Intramuscular   Manufacturer: Moderna   Lot: [id number]   [place]: [id number]

## 2020-12-01 DIAGNOSIS — M7989 Other specified soft tissue disorders: Secondary | ICD-10-CM | POA: Diagnosis not present

## 2020-12-01 DIAGNOSIS — G4700 Insomnia, unspecified: Secondary | ICD-10-CM | POA: Diagnosis not present

## 2020-12-01 DIAGNOSIS — I2510 Atherosclerotic heart disease of native coronary artery without angina pectoris: Secondary | ICD-10-CM | POA: Diagnosis not present

## 2020-12-07 ENCOUNTER — Ambulatory Visit (HOSPITAL_COMMUNITY): Payer: Medicare Other

## 2020-12-07 ENCOUNTER — Ambulatory Visit: Payer: Medicare Other | Admitting: Vascular Surgery

## 2021-01-11 ENCOUNTER — Other Ambulatory Visit (HOSPITAL_COMMUNITY): Payer: Medicare Other

## 2021-01-11 ENCOUNTER — Ambulatory Visit: Payer: Medicare Other | Admitting: Vascular Surgery

## 2021-01-14 DIAGNOSIS — C61 Malignant neoplasm of prostate: Secondary | ICD-10-CM | POA: Diagnosis not present

## 2021-01-21 DIAGNOSIS — C61 Malignant neoplasm of prostate: Secondary | ICD-10-CM | POA: Diagnosis not present

## 2021-02-15 ENCOUNTER — Other Ambulatory Visit (HOSPITAL_COMMUNITY): Payer: Medicare Other

## 2021-02-15 ENCOUNTER — Ambulatory Visit: Payer: Medicare Other | Admitting: Vascular Surgery

## 2021-03-01 ENCOUNTER — Other Ambulatory Visit: Payer: Self-pay

## 2021-03-01 ENCOUNTER — Ambulatory Visit (HOSPITAL_COMMUNITY)
Admission: RE | Admit: 2021-03-01 | Discharge: 2021-03-01 | Disposition: A | Discharge status: Home / Self Care | Payer: Medicare Other | Source: Ambulatory Visit | Attending: Vascular Surgery | Admitting: Vascular Surgery

## 2021-03-01 ENCOUNTER — Encounter: Payer: Self-pay | Admitting: Vascular Surgery

## 2021-03-01 ENCOUNTER — Ambulatory Visit: Payer: Medicare Other | Admitting: Vascular Surgery

## 2021-03-01 VITALS — BP 160/108 | HR 85 | Temp 98.2°F | Resp 18 | Ht 70.0 in | Wt 197.0 lb

## 2021-03-01 DIAGNOSIS — I714 Abdominal aortic aneurysm, without rupture, unspecified: Secondary | ICD-10-CM | POA: Diagnosis not present

## 2021-03-01 DIAGNOSIS — I7143 Infrarenal abdominal aortic aneurysm, without rupture: Secondary | ICD-10-CM

## 2021-03-01 NOTE — Progress Notes (Signed)
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 male, with history of hydrocephalus status post VP shunt, hypertension, hyperlipidemia, history of prostate cancer status post radiation therapy that presents for 1 year follow-up of a 3.86 cm AAA.  No new concerns today.  His blood pressure has been elevated.  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   S/P radiation therapy 04/23/13 - 05/29/14   Prostate/seminal vesicles, external beam 4500 cGy in 25 sessions   Seasonal allergies    Skin cancer    scalp   Wears glasses     Past Surgical History:  Procedure Laterality Date   BACK SURGERY  2009   COLONOSCOPY W/ BIOPSIES AND POLYPECTOMY     [place]   LEFT HEART CATH AND CORONARY ANGIOGRAPHY N/A 03/14/2019   Procedure: LEFT HEART CATH AND CORONARY ANGIOGRAPHY;  Surgeon: [name], MD;  Location: [place] [facility];  Service: Cardiovascular;  Laterality: N/A;   PROSTATE BIOPSY  2013, 2014, 01/2014   Gleason 7   RADIOACTIVE SEED IMPLANT N/A 07/01/2014   Procedure: RADIOACTIVE SEED IMPLANT;  Surgeon: [name], MD;  Location: [facility];  Service: Urology;  Laterality: N/A;  DR PORTABLE   TONSILLECTOMY     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   Procedure: SHUNT INSERTION VENTRICULAR-PERITONEAL;  Surgeon: [name], MD;  Location: [place];  Service: Neurosurgery;  Laterality: N/A;  SHUNT INSERTION VENTRICULAR-PERITONEAL    Family History  Problem Relation Age of Onset   Heart  attack Father 62   Cancer Father        prostate   Diabetes Father    Cancer Paternal Uncle        prostate   Dementia Mother 48   Lung cancer Brother     SOCIAL HISTORY: Social History   Socioeconomic History   Marital status: Married    Spouse name: Not on file   Number of children: 2   Years of education: Not on file   Highest education level: Not on file  Occupational History   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  Tobacco Use   Smoking status: Former    Types: Cigarettes    Quit date: 03/10/1961    Years since quitting: 60.0   Smokeless tobacco: Never  Vaping Use   Vaping Use: Never used  Substance and Sexual Activity   Alcohol use: Yes    Alcohol/week: 1.0 standard drink    Types: 1 Glasses of wine per week    Comment: rare alcohol   Drug use: No   Sexual activity: Not on file  Other Topics Concern   Not on file  Social History Narrative   Rare caffeine use    Social Determinants of Health   Financial Resource Strain: Not on file  Food Insecurity: Not on file  Transportation Needs: Not on file  Physical Activity: Not on file  Stress:  Not on file  Social Connections: Not on file  Intimate Partner Violence: Not on file    Allergies  Allergen Reactions   Lipitor [Atorvastatin] Other (See Comments)    Hip pain    Current Outpatient Medications  Medication Sig Dispense Refill   acetaminophen (TYLENOL) 500 MG tablet Take 1,000 mg by mouth every 6 (six) hours as needed for mild pain.     aspirin EC 81 MG tablet Take 81 mg by mouth daily.      Calcium Polycarbophil (FIBER) 625 MG TABS Take 625 mg by mouth daily at 6 (six) AM.     cholecalciferol (VITAMIN D3) 25 MCG (1000 UNIT) tablet Take 1,000 Units by mouth daily.     COVID-19 mRNA vaccine, Moderna, 100 MCG/0.5ML injection Inject into the muscle. 0.25 mL 0   Melatonin 10 MG CAPS Take 10 mg by mouth at bedtime as needed (for sleep).      Multiple Vitamin (MULTIVITAMIN WITH MINERALS)  TABS tablet Take 1 tablet by mouth daily.     pantoprazole (PROTONIX) 20 MG tablet Take 20 mg by mouth daily before breakfast.      potassium chloride (K-DUR) 10 MEQ tablet Take 10 mEq by mouth 2 (two) times daily.   2   senna-docusate (SENOKOT-S) 8.6-50 MG tablet Take 1 tablet by mouth at bedtime as needed for mild constipation.     tamsulosin (FLOMAX) 0.4 MG CAPS capsule Take 0.4 mg by mouth daily.     Thiamine HCl (VITAMIN B-1) 250 MG tablet Take 250 mg by mouth daily.     vitamin B-12 (CYANOCOBALAMIN) 250 MCG tablet Take 250 mcg by mouth daily.     Amino Acids-Protein Hydrolys (FEEDING SUPPLEMENT, PRO-STAT SUGAR FREE 64,) LIQD Take 30 mLs by mouth daily. (Patient not taking: No sig reported) 887 mL 0   feeding supplement, ENSURE ENLIVE, (ENSURE ENLIVE) LIQD Take 237 mLs by mouth 2 (two) times daily between meals. (Patient not taking: No sig reported) 237 mL 12   No current facility-administered medications for this visit.    REVIEW OF SYSTEMS:  [X]  denotes positive finding, [ ]  denotes negative finding Cardiac  Comments:  Chest pain or chest pressure:    Shortness of breath upon exertion:    Short of breath when lying flat:    Irregular heart rhythm:        Vascular    Pain in calf, thigh, or hip brought on by ambulation:    Pain in feet at night that wakes you up from your sleep:     Blood clot in your veins:    Leg swelling:         Pulmonary    Oxygen at home:    Productive cough:     Wheezing:         Neurologic    Sudden weakness in arms or legs:     Sudden numbness in arms or legs:     Sudden onset of difficulty speaking or slurred speech:    Temporary loss of vision in one eye:     Problems with dizziness:         Gastrointestinal    Blood in stool:     Vomited blood:         Genitourinary    Burning when urinating:     Blood in urine:        Psychiatric    Major depression:         Hematologic    Bleeding problems:  Problems with blood clotting too  easily:        Skin    Rashes or ulcers:        Constitutional    Fever or chills:      PHYSICAL EXAM: Vitals:   03/01/21 0932 03/01/21 0935  BP: (!) 169/117 (!) 160/108  Pulse: 85 85  Resp: 18   Temp: 98.2 F (36.8 C)   TempSrc: Temporal   SpO2: 92%   Weight: 197 lb (89.4 kg)   Height: 5\' 10"  (1.778 m)     GENERAL: The patient is a well-nourished male, in no acute distress. The vital signs are documented above. CARDIAC: There is a regular rate and rhythm.  VASCULAR:  2+ femoral pulse palpable bilaterally 2+ DP pulse palpable bilaterally PULMONARY: No respiratory distress ABDOMEN: Soft and non-tender. No tenderness with deep palpation in midline MUSCULOSKELETAL: There are no major deformities or cyanosis. NEUROLOGIC: No focal weakness or paresthesias are detected. SKIN: There are no ulcers or rashes noted. PSYCHIATRIC: The patient has a normal affect.  DATA:   AAA duplex today shows aneurysm increased from 3.86 cm to 4.2 cm  Assessment/Plan:  80 year old male who presents for 1 year surveillance of a 3.86 cm abdominal aortic aneurysm.  Discussed he has had some minimal growth in the aneurysm now measuring 4.2 cm.  Discussed guidelines are to repair these at greater than 5.5 cm in men unless there is rapid growth.  I will see him again in 1 year with AAA duplex   Marty Heck, MD [facility] Office: [place]

## 2021-03-02 DIAGNOSIS — N1831 Chronic kidney disease, stage 3a: Secondary | ICD-10-CM | POA: Diagnosis not present

## 2021-03-02 DIAGNOSIS — I1310 Hypertensive heart and chronic kidney disease without heart failure, with stage 1 through stage 4 chronic kidney disease, or unspecified chronic kidney disease: Secondary | ICD-10-CM | POA: Diagnosis not present

## 2021-03-02 DIAGNOSIS — E663 Overweight: Secondary | ICD-10-CM | POA: Diagnosis not present

## 2021-03-10 DIAGNOSIS — I1310 Hypertensive heart and chronic kidney disease without heart failure, with stage 1 through stage 4 chronic kidney disease, or unspecified chronic kidney disease: Secondary | ICD-10-CM | POA: Diagnosis not present

## 2021-03-10 DIAGNOSIS — N182 Chronic kidney disease, stage 2 (mild): Secondary | ICD-10-CM | POA: Diagnosis not present

## 2021-03-11 DIAGNOSIS — I1310 Hypertensive heart and chronic kidney disease without heart failure, with stage 1 through stage 4 chronic kidney disease, or unspecified chronic kidney disease: Secondary | ICD-10-CM | POA: Diagnosis not present

## 2021-03-11 DIAGNOSIS — N182 Chronic kidney disease, stage 2 (mild): Secondary | ICD-10-CM | POA: Diagnosis not present

## 2021-04-12 DIAGNOSIS — Z961 Presence of intraocular lens: Secondary | ICD-10-CM | POA: Diagnosis not present

## 2021-04-12 DIAGNOSIS — H3500 Unspecified background retinopathy: Secondary | ICD-10-CM | POA: Diagnosis not present

## 2021-04-12 DIAGNOSIS — E119 Type 2 diabetes mellitus without complications: Secondary | ICD-10-CM | POA: Diagnosis not present

## 2021-04-20 DIAGNOSIS — I10 Essential (primary) hypertension: Secondary | ICD-10-CM | POA: Diagnosis not present

## 2021-04-20 DIAGNOSIS — E785 Hyperlipidemia, unspecified: Secondary | ICD-10-CM | POA: Diagnosis not present

## 2021-04-20 DIAGNOSIS — E1151 Type 2 diabetes mellitus with diabetic peripheral angiopathy without gangrene: Secondary | ICD-10-CM | POA: Diagnosis not present

## 2021-04-20 DIAGNOSIS — Z125 Encounter for screening for malignant neoplasm of prostate: Secondary | ICD-10-CM | POA: Diagnosis not present

## 2021-04-26 DIAGNOSIS — R059 Cough, unspecified: Secondary | ICD-10-CM | POA: Diagnosis not present

## 2021-04-26 DIAGNOSIS — Z1152 Encounter for screening for COVID-19: Secondary | ICD-10-CM | POA: Diagnosis not present

## 2021-04-26 DIAGNOSIS — R0789 Other chest pain: Secondary | ICD-10-CM | POA: Diagnosis not present

## 2021-05-02 DIAGNOSIS — E1151 Type 2 diabetes mellitus with diabetic peripheral angiopathy without gangrene: Secondary | ICD-10-CM | POA: Diagnosis not present

## 2021-05-02 DIAGNOSIS — Z0000 Encounter for general adult medical examination without abnormal findings: Secondary | ICD-10-CM | POA: Diagnosis not present

## 2021-05-02 DIAGNOSIS — C4491 Basal cell carcinoma of skin, unspecified: Secondary | ICD-10-CM | POA: Diagnosis not present

## 2021-05-02 DIAGNOSIS — R82998 Other abnormal findings in urine: Secondary | ICD-10-CM | POA: Diagnosis not present

## 2021-05-02 DIAGNOSIS — E785 Hyperlipidemia, unspecified: Secondary | ICD-10-CM | POA: Diagnosis not present

## 2021-05-07 ENCOUNTER — Emergency Department (HOSPITAL_BASED_OUTPATIENT_CLINIC_OR_DEPARTMENT_OTHER)
Admission: EM | Admit: 2021-05-07 | Discharge: 2021-05-07 | Disposition: A | Discharge status: Home / Self Care | Payer: Medicare Other | Attending: Emergency Medicine | Admitting: Emergency Medicine

## 2021-05-07 ENCOUNTER — Other Ambulatory Visit: Payer: Self-pay

## 2021-05-07 ENCOUNTER — Ambulatory Visit: Admit: 2021-05-07 | Discharge status: Absent without leave | Payer: Medicare Other

## 2021-05-07 ENCOUNTER — Encounter (HOSPITAL_BASED_OUTPATIENT_CLINIC_OR_DEPARTMENT_OTHER): Payer: Self-pay | Admitting: Emergency Medicine

## 2021-05-07 DIAGNOSIS — Z8546 Personal history of malignant neoplasm of prostate: Secondary | ICD-10-CM | POA: Diagnosis not present

## 2021-05-07 DIAGNOSIS — E119 Type 2 diabetes mellitus without complications: Secondary | ICD-10-CM | POA: Insufficient documentation

## 2021-05-07 DIAGNOSIS — Z7982 Long term (current) use of aspirin: Secondary | ICD-10-CM | POA: Insufficient documentation

## 2021-05-07 DIAGNOSIS — Z85828 Personal history of other malignant neoplasm of skin: Secondary | ICD-10-CM | POA: Insufficient documentation

## 2021-05-07 DIAGNOSIS — N3001 Acute cystitis with hematuria: Secondary | ICD-10-CM | POA: Insufficient documentation

## 2021-05-07 DIAGNOSIS — I10 Essential (primary) hypertension: Secondary | ICD-10-CM | POA: Diagnosis not present

## 2021-05-07 DIAGNOSIS — R319 Hematuria, unspecified: Secondary | ICD-10-CM | POA: Diagnosis present

## 2021-05-07 LAB — URINALYSIS, ROUTINE W REFLEX MICROSCOPIC
Bilirubin Urine: NEGATIVE
Glucose, UA: NEGATIVE mg/dL
Ketones, ur: NEGATIVE mg/dL
Nitrite: NEGATIVE
Protein, ur: 30 mg/dL — AB
RBC / HPF: >50 RBC/hpf — ABNORMAL HIGH (ref 0–5)
Specific Gravity, Urine: 1.024 (ref 1.005–1.030)
pH: 5 (ref 5.0–8.0)

## 2021-05-07 MED ORDER — TAMSULOSIN HCL 0.4 MG PO CAPS: 0.4000 mg | ORAL_CAPSULE | Freq: Every day | ORAL | 0 refills | Status: DC

## 2021-05-07 MED ORDER — CEPHALEXIN 500 MG PO CAPS: 500.0000 mg | ORAL_CAPSULE | Freq: Three times a day (TID) | ORAL | 0 refills | Status: DC

## 2021-05-07 NOTE — Discharge Instructions (Signed)
Start the flomax and the antibiotic.  If you are not able to urinate or you start running high fever, vomiting or having severe pain return to the ER.

## 2021-05-07 NOTE — ED Provider Notes (Signed)
[facility] EMERGENCY DEPT Provider Note   CSN: [id number] Arrival date & time: 05/07/21  1351     History Chief Complaint  Patient presents with   Hematuria    Jeremiah [name] is a 80 y.o. male.  Pt is an 80y/o male with hx of prostate CA s/p seeding and radiation, kidney stones, HTN, DM, AAA who presents today with 1 day of hematuria and dysuria with frequency and urgency.  Pt states sx started yesterday.  No hx of prior.  Only mild pain when he attempts to urinate but denies any abd pain, flank pain, fever, N/V or appetite changes.  He reports he feels his normal self but his urine is the color of cranberry juice and when he has to urinate he gets to the bathroom and only dribbles but feels like his bladder is empty.  No blood clots in the urine and pt denies taking anticoagulants.  No prior hx of hematuria or radiation cystitis.  No recent abx and no hx of recurrent uti's.  The history is provided by the patient.  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   S/P radiation therapy 04/23/13 - 05/29/14   Prostate/seminal vesicles, external beam 4500 cGy in 25 sessions   Seasonal allergies    Skin cancer    scalp   Wears glasses     Patient Active Problem List   Diagnosis Date Noted   DMII (diabetes mellitus, type 2) ([place]) 08/23/2020   Malnutrition of moderate degree 08/23/2020   Nausea vomiting and diarrhea 08/22/2020   Palliative care by specialist    Goals of care, counseling/discussion    DNR (do not resuscitate)    Muscular weakness    Xerostomia    Pneumonia 07/05/2019   Tachycardia 07/05/2019   AMS (altered  mental status) 07/05/2019   Other fatigue    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   Small bowel obstruction ([place]) 06/10/2016   HLD (hyperlipidemia) 06/10/2016   HTN (hypertension) 06/10/2016   Acute kidney injury ([place]) 06/10/2016   Dehydration, moderate 06/10/2016   AAA (abdominal aortic aneurysm) 06/10/2016   OSA on CPAP 06/10/2016   Malignant neoplasm of prostate s/p radiation 2015 03/10/2014    Past Surgical History:  Procedure Laterality Date   BACK SURGERY  2009   COLONOSCOPY W/ BIOPSIES AND POLYPECTOMY     [place]   LEFT HEART CATH AND CORONARY ANGIOGRAPHY N/A 03/14/2019   Procedure: LEFT HEART CATH AND CORONARY ANGIOGRAPHY;  Surgeon: [name], MD;  Location: [place] [facility];  Service: Cardiovascular;  Laterality: N/A;   PROSTATE BIOPSY  2013, 2014, 01/2014   Gleason 7   RADIOACTIVE SEED IMPLANT N/A 07/01/2014   Procedure: RADIOACTIVE SEED IMPLANT;  Surgeon: [name], MD;  Location: [facility];  Service: Urology;  Laterality: N/A;  DR PORTABLE   TONSILLECTOMY     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   Procedure: SHUNT INSERTION VENTRICULAR-PERITONEAL;  Surgeon: [name], MD;  Location: [place] [facility];  Service: Neurosurgery;  Laterality: N/A;  SHUNT INSERTION VENTRICULAR-PERITONEAL       Family History  Problem Relation Age of Onset   Heart attack Father 25   Cancer Father        prostate   Diabetes Father    Cancer Paternal Uncle        prostate   Dementia Mother 25   Lung cancer Brother     Social History   Tobacco Use   Smoking status: Former    Types: Cigarettes    Quit date: 03/10/1961    Years since quitting: 60.2   Smokeless tobacco: Never  Vaping Use   Vaping Use: Never used  Substance Use Topics   Alcohol use: Yes    Alcohol/week: 1.0 standard drink    Types: 1 Glasses of wine per week    Comment: rare alcohol   Drug use: No    Home Medications Prior to Admission medications    Medication Sig Start Date End Date Taking? Authorizing Provider  cephALEXin (KEFLEX) 500 MG capsule Take 1 capsule (500 mg total) by mouth 3 (three) times daily. 05/07/21  Yes Blanchie Dessert, MD  tamsulosin (FLOMAX) 0.4 MG CAPS capsule Take 1 capsule (0.4 mg total) by mouth daily after supper. 05/07/21  Yes Blanchie Dessert, MD  acetaminophen (TYLENOL) 500 MG tablet Take 1,000 mg by mouth every 6 (six) hours as needed for mild pain.    [provider]  Amino Acids-Protein Hydrolys (FEEDING SUPPLEMENT, PRO-STAT SUGAR FREE 64,) LIQD Take 30 mLs by mouth daily. Patient not taking: No sig reported 07/16/19   Cherylann Ratel A, DO  aspirin EC 81 MG tablet Take 81 mg by mouth daily.     [provider]  Calcium Polycarbophil (FIBER) 625 MG TABS Take 625 mg by mouth daily at 6 (six) AM.    [provider]  cholecalciferol (VITAMIN D3) 25 MCG (1000 UNIT) tablet Take 1,000 Units by mouth daily.    [provider]  COVID-19 mRNA vaccine, Moderna, 100 MCG/0.5ML injection Inject into the muscle. 11/30/20   Carlyle Basques, MD  feeding supplement, ENSURE ENLIVE, (ENSURE ENLIVE) LIQD Take 237 mLs by mouth 2 (two) times daily between meals. Patient not taking: No sig reported 07/15/19   Cherylann Ratel A, DO  Melatonin 10 MG CAPS Take 10 mg by mouth at bedtime as needed (for sleep).     [provider]  Multiple Vitamin (MULTIVITAMIN WITH MINERALS) TABS tablet Take 1 tablet by mouth daily.    [provider]  pantoprazole (PROTONIX) 20 MG tablet Take 20 mg by mouth daily before breakfast.  02/19/19   [provider]  potassium chloride (K-DUR) 10 MEQ tablet Take 10 mEq by mouth 2 (two) times daily.  06/16/15   [provider]  senna-docusate (SENOKOT-S) 8.6-50 MG tablet Take 1 tablet by mouth at bedtime as needed for mild constipation.    [provider]  tamsulosin (FLOMAX) 0.4 MG CAPS capsule Take 0.4 mg by mouth daily. 08/12/20    [provider]  Thiamine HCl (VITAMIN B-1) 250 MG tablet Take 250 mg by mouth daily. 09/01/19   [provider]  vitamin B-12 (CYANOCOBALAMIN) 250 MCG tablet Take 250 mcg by mouth daily.    [provider]    Allergies    Lipitor [atorvastatin]  Review of Systems   Review of Systems  Genitourinary:  Positive for hematuria.  All other systems reviewed and are negative.  Physical Exam Updated Vital Signs BP (!) 151/99    Pulse [name] Kitchen)  102    Temp 98.4 F (36.9 C)    Resp 16    Ht 5\' 9"  (1.753 m)    Wt 86.2 kg    SpO2 94%    BMI 28.06 kg/m   Physical Exam Vitals and nursing note reviewed.  Constitutional:      General: He is not in acute distress.    Appearance: Normal appearance. He is well-developed.  HENT:     Head: Normocephalic and atraumatic.     Mouth/Throat:     Mouth: Mucous membranes are moist.  Eyes:     Conjunctiva/sclera: Conjunctivae normal.     Pupils: Pupils are equal, round, and reactive to light.  Cardiovascular:     Rate and Rhythm: Normal rate and regular rhythm.     Pulses: Normal pulses.     Heart sounds: No murmur heard. Pulmonary:     Effort: Pulmonary effort is normal. No respiratory distress.     Breath sounds: Normal breath sounds. No wheezing or rales.  Abdominal:     General: Abdomen is flat. There is no distension.     Palpations: Abdomen is soft.     Tenderness: There is no abdominal tenderness. There is no right CVA tenderness, left CVA tenderness, guarding or rebound.  Musculoskeletal:        General: No tenderness. Normal range of motion.     Cervical back: Normal range of motion and neck supple.  Skin:    General: Skin is warm and dry.     Findings: No erythema or rash.  Neurological:     Mental Status: He is alert and oriented to person, place, and time. Mental status is at baseline.  Psychiatric:        Mood and Affect: Mood normal.        Behavior: Behavior normal.    ED Results / Procedures / Treatments    Labs (all labs ordered are listed, but only abnormal results are displayed) Labs Reviewed  URINALYSIS, ROUTINE W REFLEX MICROSCOPIC - Abnormal; Notable for the following components:      Result Value   Color, Urine ORANGE (*)    APPearance HAZY (*)    Hgb urine dipstick LARGE (*)    Protein, ur 30 (*)    Leukocytes,Ua TRACE (*)    RBC / HPF >50 (*)    Bacteria, UA MANY (*)    All other components within normal limits  URINE CULTURE    EKG None  Radiology No results found.  Procedures Procedures   Medications Ordered in ED Medications - No data to display  ED Course  I have reviewed the triage vital signs and the nursing notes.  Pertinent labs & imaging results that were available during my care of the patient were reviewed by me and considered in my medical decision making (see chart for details).    MDM Rules/Calculators/A&P                         Elderly male presenting with dsyruia and hematuria that started yesterday.  No systemic infectious sx such as fever, abd/back pain, malaise or anorexia.  Well appearing here.  No reproducible pain.  Low suspicion for AAA rupture, kidney stone or pyelonephritis based on pt's exam and hx.  UA with >50RBC and 11-20wbc with many bacteria.  Pt denies sx of urinary retention today.  Suspect UTI with hemorrhagic cystitis.  Pt is not on anticoagulants and low suspicion at this time  for radiation induced hematuria.  Will treat with keflex and urine was cultured.  Also given short course of flomax due to the hesitancy and dribbling.  Pt given return precautions.  Based on labs earlier this year Cr of 1.5 and abx adjusted to TID.  MDM   Amount and/or Complexity of Data Reviewed Clinical lab tests: ordered and reviewed Independent visualization of images, tracings, or specimens: yes       Final Clinical Impression(s) / ED Diagnoses Final diagnoses:  Acute cystitis with hematuria    Rx / DC Orders ED Discharge Orders           Ordered    cephALEXin (KEFLEX) 500 MG capsule  3 times daily        05/07/21 1516    tamsulosin (FLOMAX) 0.4 MG CAPS capsule  Daily after supper        05/07/21 1516             Blanchie Dessert, MD 05/07/21 1528

## 2021-05-07 NOTE — ED Triage Notes (Signed)
Hematuria , dysuria x 1 day . Hx kidney stones year ago per pt . Pt denies abd pain nor flank pain .

## 2021-05-07 NOTE — ED Notes (Signed)
No acute distress noted upon this RN's departure of patient. Verified discharge paperwork with name and DOB. Vital signs stable. Patient taken to checkout window. Discharge paperwork discussed with patient and family. No further questions voiced upon discharge.

## 2021-05-08 LAB — URINE CULTURE: Culture: NO GROWTH

## 2021-05-11 ENCOUNTER — Inpatient Hospital Stay (HOSPITAL_COMMUNITY)
Admission: EM | Admit: 2021-05-11 | Discharge: 2021-05-13 | DRG: 659 | Disposition: A | Discharge status: Home / Self Care | Payer: Medicare Other | Attending: Internal Medicine | Admitting: Internal Medicine

## 2021-05-11 ENCOUNTER — Emergency Department (HOSPITAL_COMMUNITY): Payer: Medicare Other

## 2021-05-11 DIAGNOSIS — N179 Acute kidney failure, unspecified: Secondary | ICD-10-CM | POA: Diagnosis present

## 2021-05-11 DIAGNOSIS — G4733 Obstructive sleep apnea (adult) (pediatric): Secondary | ICD-10-CM | POA: Diagnosis present

## 2021-05-11 DIAGNOSIS — J9621 Acute and chronic respiratory failure with hypoxia: Secondary | ICD-10-CM

## 2021-05-11 DIAGNOSIS — Z87891 Personal history of nicotine dependence: Secondary | ICD-10-CM

## 2021-05-11 DIAGNOSIS — N184 Chronic kidney disease, stage 4 (severe): Secondary | ICD-10-CM | POA: Diagnosis not present

## 2021-05-11 DIAGNOSIS — R0602 Shortness of breath: Secondary | ICD-10-CM | POA: Diagnosis not present

## 2021-05-11 DIAGNOSIS — Z85828 Personal history of other malignant neoplasm of skin: Secondary | ICD-10-CM

## 2021-05-11 DIAGNOSIS — Z833 Family history of diabetes mellitus: Secondary | ICD-10-CM | POA: Diagnosis not present

## 2021-05-11 DIAGNOSIS — Z87442 Personal history of urinary calculi: Secondary | ICD-10-CM | POA: Diagnosis not present

## 2021-05-11 DIAGNOSIS — Z20822 Contact with and (suspected) exposure to covid-19: Secondary | ICD-10-CM | POA: Diagnosis not present

## 2021-05-11 DIAGNOSIS — E1122 Type 2 diabetes mellitus with diabetic chronic kidney disease: Secondary | ICD-10-CM | POA: Diagnosis present

## 2021-05-11 DIAGNOSIS — Z8249 Family history of ischemic heart disease and other diseases of the circulatory system: Secondary | ICD-10-CM | POA: Diagnosis not present

## 2021-05-11 DIAGNOSIS — N200 Calculus of kidney: Secondary | ICD-10-CM | POA: Diagnosis not present

## 2021-05-11 DIAGNOSIS — N400 Enlarged prostate without lower urinary tract symptoms: Secondary | ICD-10-CM | POA: Diagnosis present

## 2021-05-11 DIAGNOSIS — Z466 Encounter for fitting and adjustment of urinary device: Secondary | ICD-10-CM | POA: Diagnosis not present

## 2021-05-11 DIAGNOSIS — J9601 Acute respiratory failure with hypoxia: Secondary | ICD-10-CM | POA: Diagnosis present

## 2021-05-11 DIAGNOSIS — Z419 Encounter for procedure for purposes other than remedying health state, unspecified: Secondary | ICD-10-CM

## 2021-05-11 DIAGNOSIS — Z923 Personal history of irradiation: Secondary | ICD-10-CM | POA: Diagnosis not present

## 2021-05-11 DIAGNOSIS — T402X5A Adverse effect of other opioids, initial encounter: Secondary | ICD-10-CM | POA: Diagnosis present

## 2021-05-11 DIAGNOSIS — Z91199 Patient's noncompliance with other medical treatment and regimen due to unspecified reason: Secondary | ICD-10-CM

## 2021-05-11 DIAGNOSIS — K8020 Calculus of gallbladder without cholecystitis without obstruction: Secondary | ICD-10-CM | POA: Diagnosis not present

## 2021-05-11 DIAGNOSIS — N201 Calculus of ureter: Secondary | ICD-10-CM | POA: Diagnosis not present

## 2021-05-11 DIAGNOSIS — Z888 Allergy status to other drugs, medicaments and biological substances status: Secondary | ICD-10-CM

## 2021-05-11 DIAGNOSIS — C61 Malignant neoplasm of prostate: Secondary | ICD-10-CM | POA: Diagnosis present

## 2021-05-11 DIAGNOSIS — I129 Hypertensive chronic kidney disease with stage 1 through stage 4 chronic kidney disease, or unspecified chronic kidney disease: Secondary | ICD-10-CM | POA: Diagnosis not present

## 2021-05-11 DIAGNOSIS — I10 Essential (primary) hypertension: Secondary | ICD-10-CM | POA: Diagnosis present

## 2021-05-11 DIAGNOSIS — Z66 Do not resuscitate: Secondary | ICD-10-CM | POA: Diagnosis not present

## 2021-05-11 DIAGNOSIS — N35912 Unspecified bulbous urethral stricture, male: Secondary | ICD-10-CM | POA: Diagnosis not present

## 2021-05-11 DIAGNOSIS — Z9989 Dependence on other enabling machines and devices: Secondary | ICD-10-CM | POA: Diagnosis not present

## 2021-05-11 DIAGNOSIS — J9811 Atelectasis: Secondary | ICD-10-CM | POA: Diagnosis not present

## 2021-05-11 DIAGNOSIS — E785 Hyperlipidemia, unspecified: Secondary | ICD-10-CM | POA: Diagnosis not present

## 2021-05-11 DIAGNOSIS — N1330 Unspecified hydronephrosis: Secondary | ICD-10-CM | POA: Diagnosis not present

## 2021-05-11 DIAGNOSIS — N189 Chronic kidney disease, unspecified: Secondary | ICD-10-CM

## 2021-05-11 DIAGNOSIS — K5730 Diverticulosis of large intestine without perforation or abscess without bleeding: Secondary | ICD-10-CM | POA: Diagnosis not present

## 2021-05-11 DIAGNOSIS — N202 Calculus of kidney with calculus of ureter: Secondary | ICD-10-CM | POA: Diagnosis not present

## 2021-05-11 DIAGNOSIS — Z8679 Personal history of other diseases of the circulatory system: Secondary | ICD-10-CM

## 2021-05-11 DIAGNOSIS — Z8546 Personal history of malignant neoplasm of prostate: Secondary | ICD-10-CM | POA: Diagnosis not present

## 2021-05-11 DIAGNOSIS — E119 Type 2 diabetes mellitus without complications: Secondary | ICD-10-CM

## 2021-05-11 DIAGNOSIS — I714 Abdominal aortic aneurysm, without rupture, unspecified: Secondary | ICD-10-CM | POA: Diagnosis present

## 2021-05-11 DIAGNOSIS — Z982 Presence of cerebrospinal fluid drainage device: Secondary | ICD-10-CM

## 2021-05-11 LAB — BASIC METABOLIC PANEL
Anion gap: 9 (ref 5–15)
BUN: 35 mg/dL — ABNORMAL HIGH (ref 8–23)
CO2: 21 mmol/L — ABNORMAL LOW (ref 22–32)
Calcium: 8.9 mg/dL (ref 8.9–10.3)
Chloride: 105 mmol/L (ref 98–111)
Creatinine, Ser: 2.62 mg/dL — ABNORMAL HIGH (ref 0.61–1.24)
GFR, Estimated: 24 mL/min — ABNORMAL LOW (ref >60)
Glucose, Bld: 220 mg/dL — ABNORMAL HIGH (ref 70–99)
Potassium: 4.2 mmol/L (ref 3.5–5.1)
Sodium: 135 mmol/L (ref 135–145)

## 2021-05-11 LAB — CBC WITH DIFFERENTIAL/PLATELET
Abs Immature Granulocytes: 0.1 10*3/uL — ABNORMAL HIGH (ref 0.00–0.07)
Basophils Absolute: 0 10*3/uL (ref 0.0–0.1)
Basophils Relative: 0 %
Eosinophils Absolute: 0 10*3/uL (ref 0.0–0.5)
Eosinophils Relative: 0 %
HCT: 46 % (ref 39.0–52.0)
Hemoglobin: 14.6 g/dL (ref 13.0–17.0)
Immature Granulocytes: 1 %
Lymphocytes Relative: 5 %
Lymphs Abs: 0.6 10*3/uL — ABNORMAL LOW (ref 0.7–4.0)
MCH: 31.4 pg (ref 26.0–34.0)
MCHC: 31.7 g/dL (ref 30.0–36.0)
MCV: 98.9 fL (ref 80.0–100.0)
Monocytes Absolute: 1.1 10*3/uL — ABNORMAL HIGH (ref 0.1–1.0)
Monocytes Relative: 8 %
Neutro Abs: 10.7 10*3/uL — ABNORMAL HIGH (ref 1.7–7.7)
Neutrophils Relative %: 86 %
Platelets: 189 10*3/uL (ref 150–400)
RBC: 4.65 MIL/uL (ref 4.22–5.81)
RDW: 13.7 % (ref 11.5–15.5)
WBC: 12.6 10*3/uL — ABNORMAL HIGH (ref 4.0–10.5)
nRBC: 0 % (ref 0.0–0.2)

## 2021-05-11 LAB — URINALYSIS, ROUTINE W REFLEX MICROSCOPIC
Bacteria, UA: NONE SEEN
Bilirubin Urine: NEGATIVE
Glucose, UA: 50 mg/dL — AB
Ketones, ur: NEGATIVE mg/dL
Leukocytes,Ua: NEGATIVE
Nitrite: NEGATIVE
Protein, ur: 30 mg/dL — AB
Specific Gravity, Urine: 1.025 (ref 1.005–1.030)
pH: 5 (ref 5.0–8.0)

## 2021-05-11 LAB — CBC
HCT: 42.5 % (ref 39.0–52.0)
Hemoglobin: 13.1 g/dL (ref 13.0–17.0)
MCH: 31.2 pg (ref 26.0–34.0)
MCHC: 30.8 g/dL (ref 30.0–36.0)
MCV: 101.2 fL — ABNORMAL HIGH (ref 80.0–100.0)
Platelets: 151 10*3/uL (ref 150–400)
RBC: 4.2 MIL/uL — ABNORMAL LOW (ref 4.22–5.81)
RDW: 13.8 % (ref 11.5–15.5)
WBC: 8.8 10*3/uL (ref 4.0–10.5)
nRBC: 0 % (ref 0.0–0.2)

## 2021-05-11 LAB — RESP PANEL BY RT-PCR (FLU A&B, COVID) ARPGX2
Influenza A by PCR: NEGATIVE
Influenza B by PCR: NEGATIVE
SARS Coronavirus 2 by RT PCR: NEGATIVE

## 2021-05-11 LAB — BRAIN NATRIURETIC PEPTIDE: B Natriuretic Peptide: 41.1 pg/mL (ref 0.0–100.0)

## 2021-05-11 LAB — GLUCOSE, CAPILLARY: Glucose-Capillary: 174 mg/dL — ABNORMAL HIGH (ref 70–99)

## 2021-05-11 LAB — CBG MONITORING, ED: Glucose-Capillary: 150 mg/dL — ABNORMAL HIGH (ref 70–99)

## 2021-05-11 MED ORDER — LACTATED RINGERS IV BOLUS
500.0000 mL | Freq: Once | INTRAVENOUS | Status: AC
  Administered 2021-05-11: 11:00:00 500 mL via INTRAVENOUS

## 2021-05-11 MED ORDER — TAMSULOSIN HCL 0.4 MG PO CAPS: 0.4000 mg | ORAL_CAPSULE | Freq: Every day | ORAL | 0 refills | Status: DC

## 2021-05-11 MED ORDER — MORPHINE SULFATE 15 MG PO TABS: 7.5000 mg | ORAL_TABLET | ORAL | 0 refills | Status: DC | PRN

## 2021-05-11 MED ORDER — ONDANSETRON 4 MG PO TBDP
4.0000 mg | ORAL_TABLET | Freq: Once | ORAL | Status: AC
  Administered 2021-05-11: 05:00:00 4 mg via ORAL
  Filled 2021-05-11: qty 1

## 2021-05-11 MED ORDER — INSULIN ASPART 100 UNIT/ML IJ SOLN
0.0000 [IU] | Freq: Three times a day (TID) | INTRAMUSCULAR | Status: DC
  Administered 2021-05-11 – 2021-05-12 (×2): 1 [IU] via SUBCUTANEOUS
  Administered 2021-05-12 – 2021-05-13 (×3): 2 [IU] via SUBCUTANEOUS

## 2021-05-11 MED ORDER — HYDRALAZINE HCL 20 MG/ML IJ SOLN: 5.0000 mg | INTRAMUSCULAR | Status: DC | PRN

## 2021-05-11 MED ORDER — LACTATED RINGERS IV SOLN: INTRAVENOUS | Status: DC

## 2021-05-11 MED ORDER — ASPIRIN EC 81 MG PO TBEC
81.0000 mg | DELAYED_RELEASE_TABLET | Freq: Every day | ORAL | Status: DC
  Administered 2021-05-12 – 2021-05-13 (×2): 81 mg via ORAL
  Filled 2021-05-11 (×2): qty 1

## 2021-05-11 MED ORDER — OXYCODONE HCL 5 MG PO TABS
5.0000 mg | ORAL_TABLET | ORAL | Status: DC | PRN
  Administered 2021-05-13: 01:00:00 5 mg via ORAL
  Filled 2021-05-11 (×2): qty 1

## 2021-05-11 MED ORDER — TECHNETIUM TO 99M ALBUMIN AGGREGATED
3.9500 | Freq: Once | INTRAVENOUS | Status: AC | PRN
  Administered 2021-05-11: 14:00:00 3.95 via INTRAVENOUS

## 2021-05-11 MED ORDER — LACTATED RINGERS IV BOLUS: 1000.0000 mL | Freq: Once | INTRAVENOUS | Status: DC

## 2021-05-11 MED ORDER — ONDANSETRON HCL 4 MG PO TABS: 4.0000 mg | ORAL_TABLET | Freq: Four times a day (QID) | ORAL | Status: DC | PRN

## 2021-05-11 MED ORDER — ONDANSETRON 4 MG PO TBDP: ORAL_TABLET | ORAL | 0 refills | Status: DC

## 2021-05-11 MED ORDER — BISACODYL 5 MG PO TBEC: 5.0000 mg | DELAYED_RELEASE_TABLET | Freq: Every day | ORAL | Status: DC | PRN

## 2021-05-11 MED ORDER — ONDANSETRON HCL 4 MG/2ML IJ SOLN: 4.0000 mg | Freq: Four times a day (QID) | INTRAMUSCULAR | Status: DC | PRN

## 2021-05-11 MED ORDER — ACETAMINOPHEN 325 MG PO TABS: 650.0000 mg | ORAL_TABLET | Freq: Four times a day (QID) | ORAL | Status: DC | PRN

## 2021-05-11 MED ORDER — MELATONIN 5 MG PO TABS
10.0000 mg | ORAL_TABLET | Freq: Every evening | ORAL | Status: DC | PRN
  Administered 2021-05-13: 01:00:00 10 mg via ORAL
  Filled 2021-05-11: qty 2

## 2021-05-11 MED ORDER — CEPHALEXIN 500 MG PO CAPS
500.0000 mg | ORAL_CAPSULE | Freq: Three times a day (TID) | ORAL | Status: DC
  Administered 2021-05-11 – 2021-05-13 (×4): 500 mg via ORAL
  Filled 2021-05-11 (×3): qty 1
  Filled 2021-05-11: qty 2
  Filled 2021-05-11: qty 1

## 2021-05-11 MED ORDER — SENNOSIDES-DOCUSATE SODIUM 8.6-50 MG PO TABS: 1.0000 | ORAL_TABLET | Freq: Every evening | ORAL | Status: DC | PRN

## 2021-05-11 MED ORDER — ACETAMINOPHEN 650 MG RE SUPP: 650.0000 mg | Freq: Four times a day (QID) | RECTAL | Status: DC | PRN

## 2021-05-11 MED ORDER — TAMSULOSIN HCL 0.4 MG PO CAPS
0.4000 mg | ORAL_CAPSULE | Freq: Every day | ORAL | Status: DC
  Administered 2021-05-11 – 2021-05-13 (×3): 0.4 mg via ORAL
  Filled 2021-05-11 (×3): qty 1

## 2021-05-11 MED ORDER — INSULIN ASPART 100 UNIT/ML IJ SOLN: 0.0000 [IU] | Freq: Every day | INTRAMUSCULAR | Status: DC

## 2021-05-11 MED ORDER — KETOROLAC TROMETHAMINE 15 MG/ML IJ SOLN
15.0000 mg | Freq: Once | INTRAMUSCULAR | Status: AC
  Administered 2021-05-11: 05:00:00 15 mg via INTRAMUSCULAR
  Filled 2021-05-11: qty 1

## 2021-05-11 MED ORDER — DOCUSATE SODIUM 100 MG PO CAPS
100.0000 mg | ORAL_CAPSULE | Freq: Two times a day (BID) | ORAL | Status: DC
  Administered 2021-05-11 – 2021-05-13 (×4): 100 mg via ORAL
  Filled 2021-05-11 (×4): qty 1

## 2021-05-11 MED ORDER — OXYCODONE-ACETAMINOPHEN 5-325 MG PO TABS
1.0000 | ORAL_TABLET | Freq: Once | ORAL | Status: AC
Start: 2021-05-11 — End: 2021-05-11
  Administered 2021-05-11: 05:00:00 1 via ORAL
  Filled 2021-05-11: qty 1

## 2021-05-11 MED ORDER — FIBER 625 MG PO TABS: 625.0000 mg | ORAL_TABLET | Freq: Every day | ORAL | Status: DC

## 2021-05-11 MED ORDER — NUTRISOURCE FIBER PO PACK
1.0000 | PACK | Freq: Every day | ORAL | Status: DC
  Administered 2021-05-12 – 2021-05-13 (×2): 1
  Filled 2021-05-11 (×3): qty 1

## 2021-05-11 MED ORDER — ENOXAPARIN SODIUM 30 MG/0.3ML IJ SOSY
30.0000 mg | PREFILLED_SYRINGE | INTRAMUSCULAR | Status: DC
  Administered 2021-05-11 – 2021-05-12 (×2): 30 mg via SUBCUTANEOUS
  Filled 2021-05-11: qty 0.3

## 2021-05-11 MED ORDER — POLYETHYLENE GLYCOL 3350 17 G PO PACK: 17.0000 g | PACK | Freq: Every day | ORAL | Status: DC | PRN

## 2021-05-11 MED ORDER — PANTOPRAZOLE SODIUM 20 MG PO TBEC
20.0000 mg | DELAYED_RELEASE_TABLET | Freq: Every day | ORAL | Status: DC
  Administered 2021-05-11 – 2021-05-13 (×3): 20 mg via ORAL
  Filled 2021-05-11 (×4): qty 1

## 2021-05-11 MED ORDER — MORPHINE SULFATE (PF) 2 MG/ML IV SOLN
2.0000 mg | Freq: Once | INTRAVENOUS | Status: AC
  Administered 2021-05-11: 05:00:00 2 mg via INTRAMUSCULAR
  Filled 2021-05-11: qty 1

## 2021-05-11 NOTE — Assessment & Plan Note (Signed)
-  Baseline creatinine appears to be about 2, with underlying stage 3b CKD -Currently slightly worse, likely due to decreased PO intake in the setting of nephrolithiasis -Will give gentle hydration and trend BMP -Avoid nephrotoxic agents where possible

## 2021-05-11 NOTE — ED Notes (Signed)
Called lab to have BNP added to previous collection.

## 2021-05-11 NOTE — ED Notes (Signed)
E-signature pad unavailable at time of pt discharge. This RN discussed discharge materials with pt and answered all pt questions. Pt stated understanding of discharge material. ? ?

## 2021-05-11 NOTE — ED Notes (Signed)
[name] daughter [phone] requesting an update on the patient

## 2021-05-11 NOTE — Assessment & Plan Note (Addendum)
-  Patient without significant respiratory symptoms other than chronic cough -He was noted to be hypoxic after he received 2 mg IV morphine in the ER -He has had persistent hypoxia that appears to be more prolonged and substantial than would be anticipated from this medication -He cannot have a CTA due to his renal dysfunction so will order VQ scan to r/o PE -Possibly related to his OSA for which he does not wear CPAP -In looking back at prior hospitalizations, it appears that this has been noted in the past but was waxing and waning -He is being arranged for home O2 -Patient and daughter report that periodic hypoxia is related to his shunt (placed for hydrocephalus)

## 2021-05-11 NOTE — ED Provider Notes (Signed)
9:41 AM I was asked to see patient due to persistent hypoxemia.  Patient has been on oxygen ever since getting morphine during.  This was multiple hours ago.  It appears he only got 2 mg IM morphine.  He denies recent illness or shortness of breath.  Chronic cough but no new problems.  Pain is well controlled.  He denies any diarrhea or vomiting. Unclear why he's hypoxic. Will ambulate, get CXR, given fluids, and check covid/flu. Will re-assess.  Patient remains hypoxic. No clear cause. No dyspnea. Perhaps related to OSA? Doesn't wear CPAP like he's supposed to. CXR shows probably atelectasis, no clear PNA. He also has AKI. Will hydrate.  Initially did not want to be admitted so he could take care of his wife who has dementia but now he is willing to be admitted which I think would be best for his kidney function.  Previously had tried to order home oxygen but I think he still needs admission given the AKI.  Dr. Lorin Mercy to admit   Sherwood Gambler, MD 05/11/21 [phone]

## 2021-05-11 NOTE — Assessment & Plan Note (Signed)
-  Prior A1c was 6.6, and he appears to be diet controlled -Glucose is currently 220 -Cover with moderate-scale SSI

## 2021-05-11 NOTE — Assessment & Plan Note (Signed)
-  Patient had been diagnosed as having UTI with hematuria last week and appeared to be improving with Keflex -He has since developed LLQ/flank pain and imaging shows a small obstructing stone -He is likely to pass this spontaneously given the size of the stone -Will hydrate and resume Flomax -Anticipate resolution but if not can consult urology (I spoke briefly with Dr. Diona Fanti)

## 2021-05-11 NOTE — ED Provider Notes (Signed)
Emergency Medicine Provider Triage Evaluation Note  Jeremiah [name] , a 80 y.o. male  was evaluated in triage.  Pt complains of left lower abdominal/flank pain.  Onset 4-5 days ago.  Recent UTI.  Denies fever, chills, nausea, or vomiting.  Review of Systems  Positive: Flank pain, abdominal pain Negative: Fever, chills  Physical Exam  BP (!) 134/95    Pulse (!) 110    Temp 98.3 F (36.8 C) (Oral)    Resp (!) 22    SpO2 91%  Gen:   Awake, no distress   Resp:  Normal effort  MSK:   Moves extremities without difficulty  Other:    Medical Decision Making  Medically screening exam initiated at 2:25 AM.  Appropriate orders placed.  Jeremiah [name] was informed that the remainder of the evaluation will be completed by another provider, this initial triage assessment does not replace that evaluation, and the importance of remaining in the ED until their evaluation is complete.  Flank pain   Montine Circle, PA-C 05/11/21 [place], [place], DO 05/11/21 [phone]

## 2021-05-11 NOTE — Assessment & Plan Note (Signed)
-  Stable at 4.2 cm -Would be considered for repair at 5.5 cm -Followed by Dr. [name] and due to see him again in 02/2022

## 2021-05-11 NOTE — Discharge Planning (Signed)
Fuller Mandril, RN, BSN, [place] [phone] Pt qualifies for DME [facility]) oxygen.  DME  ordered through [employer].  Zack of [facility] notified to deliver DME to pt room prior to D/C home.

## 2021-05-11 NOTE — ED Triage Notes (Signed)
Pt c/o LLQ abd pain beginning Friday, recent UTI tx, states relief of urinary symptoms. Hx kidney stones, AKI

## 2021-05-11 NOTE — Assessment & Plan Note (Signed)
-  Has reported statin allergy -Hold Zetia due to limited inpatient utility

## 2021-05-11 NOTE — Assessment & Plan Note (Addendum)
-  No known recurrence -Seed implants appreciated on CT without further comment

## 2021-05-11 NOTE — ED Notes (Signed)
Patient's O2 saturation dropped to 58% on 2L while patient was sleeping. Raquel Sarna RN instructed this tech to up patients O2 to 4L. Patient woke up and his O2 saturation is currently 95% on 4L.

## 2021-05-11 NOTE — ED Notes (Signed)
Notified MD about pt softer BP's and still requiring oxygen.

## 2021-05-11 NOTE — H&P (Addendum)
History and Physical    Jeremiah [name] QTM:[id number] DOB: 06-Apr-1941 DOA: 05/11/2021  PCP: [name], MD Consultants:  [name] - vascular surgery; [place] - neurosurgery Patient coming from:  Home - lives with wife who has dementia; NOK: Jeremiah [name], daughter, [phone]  Chief Complaint: L flank pain  HPI: Jeremiah [name] is a 80 y.o. male with medical history significant of BPH; DM; HTN; HLD; OSA on CPAP; AAA; and prostate CA s/p radiation therapy presenting with L flank pain.  He reports that last week he was treated for UTI with hematuria and thought he was getting better.  He started feeling bad again over the last 2 days.  He wasn't eating and drinking well and had LLQ/flank pain.  He wasn't sleeping well either.  He decided to drive himself in to the ER last night for evaluation.  While here, he became hypoxic after receiving 2 mg IM morphine.  He has remained hypoxic.  He does not feel particularly SOB.  He has had chronic cough that is unchanged.  He does have OSA and is supposed to wear CPAP but is unable to tolerate it.   He is reluctant to remain hospitalized since he is the caregiver for his wife, who has dementia.  She has called to check on him and he is heartbroken to not be home with her - although their children are able to stay with her while he is here.    ED Course: Came in with flank pain, has kidney stone - previously treated with Keflex for UTI.  Noted to have hypoxia - ?related to morphine 2 mg IM.  81% on RA with ambulation.  Also with AKI on CKD, creatinine 2.6.  Unusual presentation.  Chronic cough, normal BNP.  On 2L, giving IVF.  Review of Systems: As per HPI; otherwise review of systems reviewed and negative.   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   S/P radiation therapy 04/23/13 - 05/29/14   Prostate/seminal vesicles, external beam 4500 cGy in 25 sessions   Seasonal allergies    Skin cancer    scalp   Wears glasses     Past Surgical History:  Procedure Laterality Date   BACK SURGERY  2009   COLONOSCOPY W/ BIOPSIES AND POLYPECTOMY     [place]   LEFT HEART CATH AND CORONARY ANGIOGRAPHY N/A 03/14/2019   Procedure: LEFT HEART CATH AND CORONARY ANGIOGRAPHY;  Surgeon: [name], MD;  Location: [facility];  Service: Cardiovascular;  Laterality: N/A;   PROSTATE BIOPSY  2013, 2014, 01/2014   Gleason 7   RADIOACTIVE SEED IMPLANT N/A 07/01/2014   Procedure: RADIOACTIVE SEED IMPLANT;  Surgeon: [name], MD;  Location[facility];  Service: Urology;  Laterality: N/A;  DR PORTABLE   TONSILLECTOMY     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   Procedure: SHUNT INSERTION VENTRICULAR-PERITONEAL;  Surgeon: [name], MD;  Location: [facility];  Service: Neurosurgery;  Laterality:  N/A;  SHUNT INSERTION VENTRICULAR-PERITONEAL    Social History   Socioeconomic History   Marital status: Married    Spouse name: Not on file   Number of children: 2   Years of education: Not on file   Highest education level: Not on file  Occupational History   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  Tobacco Use   Smoking status: Former    Types: Cigarettes    Quit date: 03/10/1961    Years since quitting: 60.2   Smokeless tobacco: Never  Vaping Use   Vaping Use: Never used  Substance and Sexual Activity   Alcohol use: Yes    Alcohol/week: 1.0 standard drink    Types: 1 Glasses of wine per week    Comment: rare alcohol   Drug use: No   Sexual activity: Not  on file  Other Topics Concern   Not on file  Social History Narrative   Rare caffeine use    Social Determinants of Health   Financial Resource Strain: Not on file  Food Insecurity: Not on file  Transportation Needs: Not on file  Physical Activity: Not on file  Stress: Not on file  Social Connections: Not on file  Intimate Partner Violence: Not on file    Allergies  Allergen Reactions   Lipitor [Atorvastatin] Other (See Comments)    Hip pain    Family History  Problem Relation Age of Onset   Heart attack Father 18   Cancer Father        prostate   Diabetes Father    Cancer Paternal Uncle        prostate   Dementia Mother 9   Lung cancer Brother     Prior to Admission medications   Medication Sig Start Date End Date Taking? Authorizing Provider  acetaminophen (TYLENOL) 500 MG tablet Take 1,000 mg by mouth every 6 (six) hours as needed for mild pain.    [provider]  Amino Acids-Protein Hydrolys (FEEDING SUPPLEMENT, PRO-STAT SUGAR FREE 64,) LIQD Take 30 mLs by mouth daily. Patient not taking: No sig reported 07/16/19   Cherylann Ratel A, DO  aspirin EC 81 MG tablet Take 81 mg by mouth daily.     [provider]  Calcium Polycarbophil (FIBER) 625 MG TABS Take 625 mg by mouth daily at 6 (six) AM.    [provider]  cephALEXin (KEFLEX) 500 MG capsule Take 1 capsule (500 mg total) by mouth 3 (three) times daily. 05/07/21   Blanchie Dessert, MD  cholecalciferol (VITAMIN D3) 25 MCG (1000 UNIT) tablet Take 1,000 Units by mouth daily.    [provider]  COVID-19 mRNA vaccine, Moderna, 100 MCG/0.5ML injection Inject into the muscle. 11/30/20   Carlyle Basques, MD  feeding supplement, ENSURE ENLIVE, (ENSURE ENLIVE) LIQD Take 237 mLs by mouth 2 (two) times daily between meals. Patient not taking: No sig reported 07/15/19   Cherylann Ratel A, DO  Melatonin 10 MG CAPS Take 10 mg by mouth at bedtime as needed (for sleep).     [provider]   morphine (MSIR) 15 MG tablet Take 0.5 tablets (7.5 mg total) by mouth every 4 (four) hours as needed for severe pain. 05/11/21   Deno Etienne, DO  Multiple Vitamin (MULTIVITAMIN WITH MINERALS) TABS tablet Take 1 tablet by mouth daily.    [provider]  ondansetron (ZOFRAN-ODT) 4 MG disintegrating tablet 4mg  ODT q4 hours prn nausea/vomit 05/11/21   Deno Etienne, DO  pantoprazole (  PROTONIX) 20 MG tablet Take 20 mg by mouth daily before breakfast.  02/19/19   [provider]  potassium chloride (K-DUR) 10 MEQ tablet Take 10 mEq by mouth 2 (two) times daily.  06/16/15   [provider]  senna-docusate (SENOKOT-S) 8.6-50 MG tablet Take 1 tablet by mouth at bedtime as needed for mild constipation.    [provider]  tamsulosin (FLOMAX) 0.4 MG CAPS capsule Take 1 capsule (0.4 mg total) by mouth daily after supper. 05/11/21   Deno Etienne, DO  Thiamine HCl (VITAMIN B-1) 250 MG tablet Take 250 mg by mouth daily. 09/01/19   [provider]  vitamin B-12 (CYANOCOBALAMIN) 250 MCG tablet Take 250 mcg by mouth daily.    [provider]    Physical Exam: Vitals:   05/11/21 2423 05/11/21 0932 05/11/21 1037 05/11/21 1100  BP: 108/67 107/77  127/77  Pulse: 72 76 78 77  Resp: 18 17  18   Temp:      TempSrc:      SpO2: 96% 95% 90% 94%     General:  Appears calm and comfortable and is in NAD, on [place] O2 Eyes:  EOMI, normal lids, iris ENT:  grossly normal hearing, lips & tongue, mmm; appropriate dentition Neck:  no LAD, masses or thyromegaly Cardiovascular:  RRR, no m/r/g. No LE edema.  Respiratory:   CTA bilaterally with no wheezes/rales/rhonchi.  Normal respiratory effort. Abdomen:  soft, NT, ND Skin:  no rash or induration seen on limited exam Musculoskeletal:  grossly normal tone BUE/BLE, good ROM, no bony abnormality Psychiatric:  blunted mood and affect (emotionally labile when talking about his wife), speech fluent and appropriate, AOx3 Neurologic:  CN  2-12 grossly intact, moves all extremities in coordinated fashion    Radiological Exams on Admission: Independently reviewed - see discussion in A/P where applicable  NM Pulmonary Perfusion  Result Date: 05/11/2021 CLINICAL DATA:  Hypoxia, flank pain, history prostate cancer post radiation therapy, recent UTI with hematuria, chronic cough unchanged, hypertension EXAM: NUCLEAR MEDICINE PERFUSION LUNG SCAN TECHNIQUE: Perfusion images were obtained in multiple projections after intravenous injection of radiopharmaceutical. Ventilation scans intentionally deferred if perfusion scan and chest x-ray adequate for interpretation during COVID 19 epidemic. RADIOPHARMACEUTICALS:  3.95 mCi Tc-62m MAA IV COMPARISON:  Chest radiograph but 05/11/2021 FINDINGS: Minimal irregularity of perfusion at the posterior bases of the lower lobes corresponding minimal basilar atelectasis. No segmental or subsegmental perfusion defects otherwise identified. Elevation of RIGHT diaphragm noted. IMPRESSION: Minimal bibasilar atelectasis. No scintigraphic evidence of pulmonary embolism. Electronically Signed   By: Lavonia Dana M.D.   On: 05/11/2021 14:28   DG Chest Portable 1 View  Result Date: 05/11/2021 CLINICAL DATA:  Shortness of breath EXAM: PORTABLE CHEST 1 VIEW COMPARISON:  Chest x-ray dated August 22, 2020 FINDINGS: Cardiac and mediastinal contours are unchanged. Mild bibasilar opacities, likely due to atelectasis. Unchanged elevation of the right hemidiaphragm. No evidence of pneumothorax. Partially visualized shunt catheter tubing with no evidence of discontinuity or kinking. IMPRESSION: Mild bibasilar opacities, likely due to atelectasis. Electronically Signed   By: Yetta Glassman M.D.   On: 05/11/2021 09:54   CT Renal Stone Study  Result Date: 05/11/2021 CLINICAL DATA:  Flank pain. EXAM: CT ABDOMEN AND PELVIS WITHOUT CONTRAST TECHNIQUE: Multidetector CT imaging of the abdomen and pelvis was performed following the  standard protocol without IV contrast. COMPARISON:  None. FINDINGS: Lower chest: Mild atelectasis is seen within the posterior aspects of the bilateral lung bases. Hepatobiliary: No focal liver abnormality  is seen. A thin layer of subcentimeter gallstones is seen within the lumen of an otherwise normal-appearing gallbladder. Pancreas: Unremarkable. No pancreatic ductal dilatation or surrounding inflammatory changes. Spleen: Normal in size without focal abnormality. Adrenals/Urinary Tract: Adrenal glands are unremarkable. Kidneys are normal, without focal lesions. Multiple subcentimeter nonobstructing renal stones are seen within the right kidney. An ill-defined 2 mm obstructing renal stone is seen within the distal left ureter, near the left UVJ (axial CT image 84, CT series 3). Mild left-sided hydronephrosis and hydroureter are present. Bladder is unremarkable. Stomach/Bowel: Stomach is within normal limits. The appendix is not clearly identified. No evidence of bowel wall thickening, distention, or inflammatory changes. Noninflamed diverticula are seen throughout the descending and sigmoid colon. Vascular/Lymphatic: Aortic atherosclerosis with stable 4.2 cm x 3.3 cm aneurysmal dilatation of the infrarenal abdominal aorta. No enlarged abdominal or pelvic lymph nodes. Reproductive: Multiple prostate radiation implantation seeds are seen. Other: No abdominal wall hernia or abnormality. No abdominopelvic ascites. Musculoskeletal: Bilateral pedicle screws are seen at the levels of L4 and L5. IMPRESSION: 1. Obstructing 2 mm distal left ureteral stone, near the left UVJ. 2. Multiple subcentimeter nonobstructing right renal stones. 3. Cholelithiasis. 4. Colonic diverticulosis. 5. Stable 4.2 cm x 3.3 cm infrarenal abdominal aortic aneurysm. 6. Aortic atherosclerosis. Aortic Atherosclerosis (ICD10-I70.0). Electronically Signed   By: Virgina Norfolk M.D.   On: 05/11/2021 03:12    EKG: not done   Labs on Admission: I  have personally reviewed the available labs and imaging studies at the time of the admission.  Pertinent labs:   Glucose 220 BUN 35/Creatinine 2.62/GFR 24 WBC 12.6 BNP 41.1 COVID/flu negative UA: 50 glucose, moderate Hgb, 30 protein   Assessment/Plan Principal Problem:   Nephrolithiasis Active Problems:   Acute respiratory failure with hypoxia (HCC)   AKI (acute kidney injury) ([place])   Malignant neoplasm of prostate s/p radiation 2015   HLD (hyperlipidemia)   HTN (hypertension)   AAA (abdominal aortic aneurysm)   DNR (do not resuscitate)   DMII (diabetes mellitus, type 2) ([place])    * Nephrolithiasis -Patient had been diagnosed as having UTI with hematuria last week and appeared to be improving with Keflex -He has since developed LLQ/flank pain and imaging shows a small obstructing stone -He is likely to pass this spontaneously given the size of the stone -Will hydrate and resume Flomax -Anticipate resolution but if not can consult urology (I spoke briefly with Dr. Diona Fanti)  AKI (acute kidney injury) ([place]) -Baseline creatinine appears to be about 2, with underlying stage 3b CKD -Currently slightly worse, likely due to decreased PO intake in the setting of nephrolithiasis -Will give gentle hydration and trend BMP -Avoid nephrotoxic agents where possible  Acute respiratory failure with hypoxia ([place]) -Patient without significant respiratory symptoms other than chronic cough -He was noted to be hypoxic after he received 2 mg IV morphine in the ER -He has had persistent hypoxia that appears to be more prolonged and substantial than would be anticipated from this medication -He cannot have a CTA due to his renal dysfunction so will order VQ scan to r/o PE -Possibly related to his OSA for which he does not wear CPAP -In looking back at prior hospitalizations, it appears that this has been noted in the past but was waxing and waning -He is being arranged for home O2 -Patient and  daughter report that periodic hypoxia is related to his shunt (placed for hydrocephalus)  DMII (diabetes mellitus, type 2) ([place]) -Prior A1c was 6.6, and  he appears to be diet controlled -Glucose is currently 220 -Cover with moderate-scale SSI   DNR (do not resuscitate) -I have discussed code status with the patient and he would not desire resuscitation and would prefer to die a natural death should that situation arise. -He will need a gold out of facility DNR form at the time of discharge  AAA (abdominal aortic aneurysm) -Stable at 4.2 cm -Would be considered for repair at 5.5 cm -Followed by Dr. [name] and due to see him again in 02/2022  HTN (hypertension) -Hold Cozaar due to AKI -Will cover with IV prn hydralazine  HLD (hyperlipidemia) -Has reported statin allergy -Hold Zetia due to limited inpatient utility  Malignant neoplasm of prostate s/p radiation 2015 -No known recurrence -Seed implants appreciated on CT without further comment       Note: This patient has been tested and is negative for the novel coronavirus COVID-19. The patient has been fully vaccinated against COVID-19.   Level of care: Med Surg DVT prophylaxis:  Lovenox  Code Status:  DNR - confirmed with patient Family Communication: None present Disposition Plan:  The patient is from: home  Anticipated d/c is to: home without [facility] services  Anticipated d/c date will depend on clinical response to treatment, but possibly as early as tomorrow if he has excellent response to treatment  Patient is currently: acutely ill Consults called: None  Admission status:  It is my clinical opinion that referral for OBSERVATION is reasonable and necessary in this patient based on the above information provided. The aforementioned taken together are felt to place the patient at high risk for further clinical deterioration. However it is anticipated that the patient may be medically stable for discharge from the hospital  within 24 to 48 hours.    Karmen Bongo MD [facility]   How to contact the [facility] Attending or Consulting provider [place] or covering provider during after hours [place], for this patient?  Check the care team in [facility] and look for a) attending/consulting [facility] provider listed and b) the [facility] team listed Log into [email or web address] and use [place]'s universal password to access. If you do not have the password, please contact the hospital operator. Locate the [facility] provider you are looking for under [facility] and page to a number that you can be directly reached. If you still have difficulty reaching the provider, please page the [facility] (Director on Call) for the Hospitalists listed on amion for assistance.   05/11/2021, 3:50 PM

## 2021-05-11 NOTE — ED Notes (Signed)
Placed pt on 2L of O2 due to sats being 86%. Patient currently 93% on 2L.

## 2021-05-11 NOTE — Assessment & Plan Note (Signed)
-  I have discussed code status with the patient and he would not desire resuscitation and would prefer to die a natural death should that situation arise. -He will need a gold out of facility DNR form at the time of discharge

## 2021-05-11 NOTE — ED Notes (Signed)
Ambulated patient on room air, patient's O2 saturation dropped to 90% while ambulating. Patient stated he was not short of breath and felt stable walking. After sitting patient's O2 steadily dropped to 82%. Patient stated again he was not short of breath. After sitting for a moment patients O2 saturation returned to 93% on room air.

## 2021-05-11 NOTE — Discharge Instructions (Addendum)
Follow with Primary MD [name], MD and the recommended urologist in 7 days   Get CBC, CMP, 2 view Chest X ray -  checked next visit within 1 week by Primary MD    Activity: As tolerated with Full fall precautions use walker/cane & assistance as needed  Disposition Home    Diet: Heart Healthy Low Carb, keep yourself well-hydrated  Special Instructions: If you have smoked or chewed Tobacco  in the last 2 yrs please stop smoking, stop any regular Alcohol  and or any Recreational drug use.  On your next visit with your primary care physician please Get Medicines reviewed and adjusted.  Please request your Prim.MD to go over all Hospital Tests and Procedure/Radiological results at the follow up, please get all Hospital records sent to your Prim MD by signing hospital release before you go home.  If you experience worsening of your admission symptoms, develop shortness of breath, life threatening emergency, suicidal or homicidal thoughts you must seek medical attention immediately by calling 911 or calling your MD immediately  if symptoms less severe.  You Must read complete instructions/literature along with all the possible adverse reactions/side effects for all the Medicines you take and that have been prescribed to you. Take any new Medicines after you have completely understood and accpet all the possible adverse reactions/side effects.

## 2021-05-11 NOTE — ED Provider Notes (Signed)
[facility] EMERGENCY DEPARTMENT Provider Note   CSN: [id number] Arrival date & time: 05/11/21  1017     History Chief Complaint  Patient presents with   Abdominal Pain    Jeremiah [name] is a 80 y.o. male.  80 yO M with a chief complaints of left lower quadrant abdominal pain.  Is been going on for about a week now but got much worse today.  Denies nausea or vomiting.  At the onset of this he had hematuria.  Was seen by a physician and diagnosed with a urinary tract infection and started on antibiotics.  He feels like the blood is improved.  He denied any dysuria fevers or chills.  The history is provided by the patient.  Abdominal Pain Pain location:  LLQ Pain quality: aching and sharp   Pain radiates to:  Does not radiate Pain severity:  Moderate Onset quality:  Gradual Duration:  1 week Timing:  Constant Progression:  Worsening Chronicity:  New Relieved by:  Nothing Worsened by:  Nothing Ineffective treatments:  None tried Associated symptoms: no chest pain, no chills, no diarrhea, no fever, no shortness of breath and no vomiting       Past Medical History:  Diagnosis Date   BPH (benign prostatic hyperplasia)    Central retinal artery occlusion    Diabetes mellitus without complication (HCC)    diet controlled   Diverticulosis    History of kidney stones    Hyperlipidemia    Hypertension    OSA on CPAP    wears cpap   Osteoarthritis    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, 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   S/P radiation therapy 04/23/13 - 05/29/14   Prostate/seminal vesicles, external beam 4500 cGy in 25 sessions   Seasonal allergies    Skin cancer    scalp   Wears glasses     Patient Active Problem List   Diagnosis Date Noted   DMII (diabetes mellitus, type 2) ([place]) 08/23/2020   Malnutrition of moderate degree 08/23/2020   Nausea vomiting and diarrhea 08/22/2020   Palliative care by specialist    Goals of care, counseling/discussion    DNR (do  not resuscitate)    Muscular weakness    Xerostomia    Pneumonia 07/05/2019   Tachycardia 07/05/2019   AMS (altered mental status) 07/05/2019   Other fatigue    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   Small bowel obstruction ([place]) 06/10/2016   HLD (hyperlipidemia) 06/10/2016   HTN (hypertension) 06/10/2016   Acute kidney injury ([place]) 06/10/2016   Dehydration, moderate 06/10/2016   AAA (abdominal aortic aneurysm) 06/10/2016   OSA on CPAP 06/10/2016   Malignant neoplasm of prostate s/p radiation 2015 03/10/2014    Past Surgical History:  Procedure Laterality Date   BACK SURGERY  2009   COLONOSCOPY W/ BIOPSIES AND POLYPECTOMY     HYDROCELE EXCISION  1963   LEFT HEART CATH AND CORONARY ANGIOGRAPHY N/A 03/14/2019   Procedure: LEFT HEART CATH AND CORONARY ANGIOGRAPHY;  Surgeon: [name], MD;  Location: [facility];  Service: Cardiovascular;  Laterality: N/A;   PROSTATE BIOPSY  2013, 2014, 01/2014   Gleason 7   RADIOACTIVE SEED IMPLANT N/A 07/01/2014   Procedure: RADIOACTIVE SEED IMPLANT;  Surgeon: [name], MD;  Location: [facility];  Service: Urology;  Laterality: N/A;  DR PORTABLE   TONSILLECTOMY     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   Procedure: SHUNT INSERTION VENTRICULAR-PERITONEAL;  Surgeon: [name], MD;  Location: [place];  Service: Neurosurgery;  Laterality: N/A;  SHUNT INSERTION VENTRICULAR-PERITONEAL       Family History  Problem Relation Age of Onset   Heart attack Father 74   Cancer Father        prostate   Diabetes Father    Cancer Paternal Uncle        prostate   Dementia Mother 14   Lung cancer Brother     Social History   Tobacco Use   Smoking status: Former    Types: Cigarettes    Quit date: 03/10/1961    Years since quitting: 60.2   Smokeless tobacco: Never  Vaping Use   Vaping Use: Never used  Substance Use Topics   Alcohol use: Yes    Alcohol/week: 1.0 standard drink    Types: 1  Glasses of wine per week    Comment: rare alcohol   Drug use: No    Home Medications Prior to Admission medications   Medication Sig Start Date End Date Taking? Authorizing Provider  morphine (MSIR) 15 MG tablet Take 0.5 tablets (7.5 mg total) by mouth every 4 (four) hours as needed for severe pain. 05/11/21  Yes Deno Etienne, DO  ondansetron (ZOFRAN-ODT) 4 MG disintegrating tablet 4mg  ODT q4 hours prn nausea/vomit 05/11/21  Yes Deno Etienne, DO  tamsulosin (FLOMAX) 0.4 MG CAPS capsule Take 1 capsule (0.4 mg total) by mouth daily after supper. 05/11/21  Yes Deno Etienne, DO  acetaminophen (TYLENOL) 500 MG tablet Take 1,000 mg by mouth every 6 (six) hours as needed for mild pain.    [provider]  Amino Acids-Protein Hydrolys (FEEDING SUPPLEMENT, PRO-STAT SUGAR FREE 64,) LIQD Take 30 mLs by mouth daily. Patient not taking: No sig reported 07/16/19   Cherylann Ratel A, DO  aspirin EC 81 MG tablet Take 81 mg by mouth daily.     [provider]  Calcium Polycarbophil (FIBER) 625 MG TABS Take 625 mg by mouth daily at 6 (six) AM.    [provider]  cephALEXin (KEFLEX) 500 MG capsule Take 1 capsule (500 mg total) by mouth 3 (three) times daily. 05/07/21   Blanchie Dessert, MD  cholecalciferol (VITAMIN D3) 25 MCG (1000 UNIT) tablet Take 1,000 Units by mouth daily.    [provider]  COVID-19 mRNA vaccine, Moderna, 100 MCG/0.5ML injection Inject into the muscle. 11/30/20   Carlyle Basques, MD  feeding supplement, ENSURE ENLIVE, (ENSURE ENLIVE) LIQD Take 237 mLs by mouth 2 (two) times daily between meals. Patient not taking: No sig reported 07/15/19   Cherylann Ratel A, DO  Melatonin 10 MG CAPS Take 10 mg by mouth at bedtime as needed (for sleep).     [provider]  Multiple Vitamin (MULTIVITAMIN WITH MINERALS) TABS tablet Take 1 tablet by mouth daily.    [provider]  pantoprazole (PROTONIX) 20 MG tablet Take 20 mg by mouth daily before breakfast.   02/19/19   [provider]  potassium chloride (K-DUR) 10 MEQ tablet Take 10 mEq by mouth 2 (two) times daily.  06/16/15   [provider]  senna-docusate (SENOKOT-S) 8.6-50 MG tablet Take 1 tablet by mouth at bedtime as needed for mild constipation.    [provider]  Thiamine HCl (VITAMIN B-1) 250 MG tablet Take 250 mg by mouth daily. 09/01/19   [provider]  vitamin B-12 (CYANOCOBALAMIN) 250 MCG tablet Take 250 mcg by mouth daily.    [provider]  Allergies    Lipitor [atorvastatin]  Review of Systems   Review of Systems  Constitutional:  Negative for chills and fever.  HENT:  Negative for congestion and facial swelling.   Eyes:  Negative for discharge and visual disturbance.  Respiratory:  Negative for shortness of breath.   Cardiovascular:  Negative for chest pain and palpitations.  Gastrointestinal:  Positive for abdominal pain. Negative for diarrhea and vomiting.  Musculoskeletal:  Negative for arthralgias and myalgias.  Skin:  Negative for color change and rash.  Neurological:  Negative for tremors, syncope and headaches.  Psychiatric/Behavioral:  Negative for confusion and dysphoric mood.    Physical Exam Updated Vital Signs BP 119/68    Pulse 94    Temp 97.9 F (36.6 C) (Oral)    Resp 16    SpO2 94%   Physical Exam Vitals and nursing note reviewed.  Constitutional:      Appearance: He is well-developed.  HENT:     Head: Normocephalic and atraumatic.  Eyes:     Pupils: Pupils are equal, round, and reactive to light.  Neck:     Vascular: No JVD.  Cardiovascular:     Rate and Rhythm: Normal rate and regular rhythm.     Heart sounds: No murmur heard.   No friction rub. No gallop.  Pulmonary:     Effort: No respiratory distress.     Breath sounds: No wheezing.  Abdominal:     General: There is no distension.     Tenderness: There is no abdominal tenderness. There is no guarding or rebound.  Musculoskeletal:         General: Normal range of motion.     Cervical back: Normal range of motion and neck supple.  Skin:    Coloration: Skin is not pale.     Findings: No rash.  Neurological:     Mental Status: He is alert and oriented to person, place, and time.  Psychiatric:        Behavior: Behavior normal.    ED Results / Procedures / Treatments   Labs (all labs ordered are listed, but only abnormal results are displayed) Labs Reviewed  CBC WITH DIFFERENTIAL/PLATELET - Abnormal; Notable for the following components:      Result Value   WBC 12.6 (*)    Neutro Abs 10.7 (*)    Lymphs Abs 0.6 (*)    Monocytes Absolute 1.1 (*)    Abs Immature Granulocytes 0.10 (*)    All other components within normal limits  BASIC METABOLIC PANEL - Abnormal; Notable for the following components:   CO2 21 (*)    Glucose, Bld 220 (*)    BUN 35 (*)    Creatinine, Ser 2.62 (*)    GFR, Estimated 24 (*)    All other components within normal limits  URINALYSIS, ROUTINE W REFLEX MICROSCOPIC - Abnormal; Notable for the following components:   APPearance HAZY (*)    Glucose, UA 50 (*)    Hgb urine dipstick MODERATE (*)    Protein, ur 30 (*)    All other components within normal limits    EKG None  Radiology CT Renal Stone Study  Result Date: 05/11/2021 CLINICAL DATA:  Flank pain. EXAM: CT ABDOMEN AND PELVIS WITHOUT CONTRAST TECHNIQUE: Multidetector CT imaging of the abdomen and pelvis was performed following the standard protocol without IV contrast. COMPARISON:  None. FINDINGS: Lower chest: Mild atelectasis is seen within the posterior aspects of the bilateral lung bases. Hepatobiliary: No focal liver  abnormality is seen. A thin layer of subcentimeter gallstones is seen within the lumen of an otherwise normal-appearing gallbladder. Pancreas: Unremarkable. No pancreatic ductal dilatation or surrounding inflammatory changes. Spleen: Normal in size without focal abnormality. Adrenals/Urinary Tract: Adrenal glands are  unremarkable. Kidneys are normal, without focal lesions. Multiple subcentimeter nonobstructing renal stones are seen within the right kidney. An ill-defined 2 mm obstructing renal stone is seen within the distal left ureter, near the left UVJ (axial CT image 84, CT series 3). Mild left-sided hydronephrosis and hydroureter are present. Bladder is unremarkable. Stomach/Bowel: Stomach is within normal limits. The appendix is not clearly identified. No evidence of bowel wall thickening, distention, or inflammatory changes. Noninflamed diverticula are seen throughout the descending and sigmoid colon. Vascular/Lymphatic: Aortic atherosclerosis with stable 4.2 cm x 3.3 cm aneurysmal dilatation of the infrarenal abdominal aorta. No enlarged abdominal or pelvic lymph nodes. Reproductive: Multiple prostate radiation implantation seeds are seen. Other: No abdominal wall hernia or abnormality. No abdominopelvic ascites. Musculoskeletal: Bilateral pedicle screws are seen at the levels of L4 and L5. IMPRESSION: 1. Obstructing 2 mm distal left ureteral stone, near the left UVJ. 2. Multiple subcentimeter nonobstructing right renal stones. 3. Cholelithiasis. 4. Colonic diverticulosis. 5. Stable 4.2 cm x 3.3 cm infrarenal abdominal aortic aneurysm. 6. Aortic atherosclerosis. Aortic Atherosclerosis (ICD10-I70.0). Electronically Signed   By: Virgina Norfolk M.D.   On: 05/11/2021 03:12    Procedures Procedures   Medications Ordered in ED Medications  oxyCODONE-acetaminophen (PERCOCET/ROXICET) 5-325 MG per tablet 1 tablet (1 tablet Oral Given 05/11/21 0434)  ketorolac (TORADOL) 15 MG/ML injection 15 mg (15 mg Intramuscular Given 05/11/21 0505)  morphine 2 MG/ML injection 2 mg (2 mg Intramuscular Given 05/11/21 0504)  ondansetron (ZOFRAN-ODT) disintegrating tablet 4 mg (4 mg Oral Given 05/11/21 7893)    ED Course  I have reviewed the triage vital signs and the nursing notes.  Pertinent labs & imaging results that were  available during my care of the patient were reviewed by me and considered in my medical decision making (see chart for details).    MDM Rules/Calculators/A&P                         80 yo M with a chief complaint of left lower quadrant abdominal discomfort.  Off and on over the past week.  Worsening in the past 12 to 24 hours.  CT scan obtained in MSE process concerning for a 2 mm UVJ stone.  UA without obvious infection.  He was treated for a possible urinary tract infection though it might of been treatment for hematuria due to the kidney stone and not necessarily infection.  He otherwise was not endorsing any symptoms of a UTI has no fevers.  We will treat his pain and reassess.  Patient feeling better.  D/c home. Urology follow up.  5:40 AM:  I have discussed the diagnosis/risks/treatment options with the patient and believe the pt to be eligible for discharge home to follow-up with Urology. We also discussed returning to the ED immediately if new or worsening sx occur. We discussed the sx which are most concerning (e.g., sudden worsening pain, fever, inability to tolerate by mouth) that necessitate immediate return. Medications administered to the patient during their visit and any new prescriptions provided to the patient are listed below.  Medications given during this visit Medications  oxyCODONE-acetaminophen (PERCOCET/ROXICET) 5-325 MG per tablet 1 tablet (1 tablet Oral Given 05/11/21 0434)  ketorolac (TORADOL) 15 MG/ML injection  15 mg (15 mg Intramuscular Given 05/11/21 0505)  morphine 2 MG/ML injection 2 mg (2 mg Intramuscular Given 05/11/21 0504)  ondansetron (ZOFRAN-ODT) disintegrating tablet 4 mg (4 mg Oral Given 05/11/21 0504)     The patient appears reasonably screen and/or stabilized for discharge and I doubt any other medical condition or other [facility] requiring further screening, evaluation, or treatment in the ED at this time prior to discharge.      Final Clinical  Impression(s) / ED Diagnoses Final diagnoses:  Nephrolithiasis    Rx / DC Orders ED Discharge Orders          Ordered    morphine (MSIR) 15 MG tablet  Every 4 hours PRN        05/11/21 0539    ondansetron (ZOFRAN-ODT) 4 MG disintegrating tablet        05/11/21 0539    tamsulosin (FLOMAX) 0.4 MG CAPS capsule  Daily after supper        05/11/21 [place], [place], DO 05/11/21 [id number]

## 2021-05-11 NOTE — Assessment & Plan Note (Signed)
-  Hold Cozaar due to AKI -Will cover with IV prn hydralazine

## 2021-05-12 ENCOUNTER — Encounter (HOSPITAL_COMMUNITY)
Admission: EM | Disposition: A | Discharge status: Home / Self Care | Payer: Self-pay | Source: Home / Self Care | Attending: Internal Medicine

## 2021-05-12 ENCOUNTER — Encounter (HOSPITAL_COMMUNITY): Payer: Self-pay | Admitting: Internal Medicine

## 2021-05-12 ENCOUNTER — Inpatient Hospital Stay (HOSPITAL_COMMUNITY): Payer: Medicare Other | Admitting: Anesthesiology

## 2021-05-12 ENCOUNTER — Inpatient Hospital Stay (HOSPITAL_COMMUNITY): Payer: Medicare Other

## 2021-05-12 ENCOUNTER — Observation Stay (HOSPITAL_COMMUNITY): Payer: Medicare Other

## 2021-05-12 DIAGNOSIS — E1122 Type 2 diabetes mellitus with diabetic chronic kidney disease: Secondary | ICD-10-CM | POA: Diagnosis not present

## 2021-05-12 DIAGNOSIS — Z87891 Personal history of nicotine dependence: Secondary | ICD-10-CM | POA: Diagnosis not present

## 2021-05-12 DIAGNOSIS — Z20822 Contact with and (suspected) exposure to covid-19: Secondary | ICD-10-CM | POA: Diagnosis not present

## 2021-05-12 DIAGNOSIS — Z66 Do not resuscitate: Secondary | ICD-10-CM | POA: Diagnosis not present

## 2021-05-12 DIAGNOSIS — Z87442 Personal history of urinary calculi: Secondary | ICD-10-CM | POA: Diagnosis not present

## 2021-05-12 DIAGNOSIS — N179 Acute kidney failure, unspecified: Secondary | ICD-10-CM | POA: Diagnosis not present

## 2021-05-12 DIAGNOSIS — Z833 Family history of diabetes mellitus: Secondary | ICD-10-CM | POA: Diagnosis not present

## 2021-05-12 DIAGNOSIS — N184 Chronic kidney disease, stage 4 (severe): Secondary | ICD-10-CM | POA: Diagnosis not present

## 2021-05-12 DIAGNOSIS — N200 Calculus of kidney: Secondary | ICD-10-CM | POA: Diagnosis not present

## 2021-05-12 DIAGNOSIS — Z85828 Personal history of other malignant neoplasm of skin: Secondary | ICD-10-CM | POA: Diagnosis not present

## 2021-05-12 DIAGNOSIS — Z8249 Family history of ischemic heart disease and other diseases of the circulatory system: Secondary | ICD-10-CM | POA: Diagnosis not present

## 2021-05-12 DIAGNOSIS — Z888 Allergy status to other drugs, medicaments and biological substances status: Secondary | ICD-10-CM | POA: Diagnosis not present

## 2021-05-12 DIAGNOSIS — T402X5A Adverse effect of other opioids, initial encounter: Secondary | ICD-10-CM | POA: Diagnosis present

## 2021-05-12 DIAGNOSIS — N400 Enlarged prostate without lower urinary tract symptoms: Secondary | ICD-10-CM | POA: Diagnosis present

## 2021-05-12 DIAGNOSIS — Z923 Personal history of irradiation: Secondary | ICD-10-CM | POA: Diagnosis not present

## 2021-05-12 DIAGNOSIS — Z982 Presence of cerebrospinal fluid drainage device: Secondary | ICD-10-CM | POA: Diagnosis not present

## 2021-05-12 DIAGNOSIS — Z8546 Personal history of malignant neoplasm of prostate: Secondary | ICD-10-CM | POA: Diagnosis not present

## 2021-05-12 DIAGNOSIS — I129 Hypertensive chronic kidney disease with stage 1 through stage 4 chronic kidney disease, or unspecified chronic kidney disease: Secondary | ICD-10-CM | POA: Diagnosis not present

## 2021-05-12 DIAGNOSIS — G4733 Obstructive sleep apnea (adult) (pediatric): Secondary | ICD-10-CM | POA: Diagnosis not present

## 2021-05-12 DIAGNOSIS — J9601 Acute respiratory failure with hypoxia: Secondary | ICD-10-CM | POA: Diagnosis not present

## 2021-05-12 DIAGNOSIS — Z8679 Personal history of other diseases of the circulatory system: Secondary | ICD-10-CM | POA: Diagnosis not present

## 2021-05-12 DIAGNOSIS — N35912 Unspecified bulbous urethral stricture, male: Secondary | ICD-10-CM | POA: Diagnosis present

## 2021-05-12 DIAGNOSIS — E785 Hyperlipidemia, unspecified: Secondary | ICD-10-CM | POA: Diagnosis not present

## 2021-05-12 DIAGNOSIS — Z91199 Patient's noncompliance with other medical treatment and regimen due to unspecified reason: Secondary | ICD-10-CM | POA: Diagnosis not present

## 2021-05-12 HISTORY — PX: CYSTOSCOPY W/ URETERAL STENT PLACEMENT: SHX1429

## 2021-05-12 LAB — BASIC METABOLIC PANEL
Anion gap: 8 (ref 5–15)
BUN: 42 mg/dL — ABNORMAL HIGH (ref 8–23)
CO2: 26 mmol/L (ref 22–32)
Calcium: 8.4 mg/dL — ABNORMAL LOW (ref 8.9–10.3)
Chloride: 105 mmol/L (ref 98–111)
Creatinine, Ser: 2.92 mg/dL — ABNORMAL HIGH (ref 0.61–1.24)
GFR, Estimated: 21 mL/min — ABNORMAL LOW (ref >60)
Glucose, Bld: 112 mg/dL — ABNORMAL HIGH (ref 70–99)
Potassium: 4.7 mmol/L (ref 3.5–5.1)
Sodium: 139 mmol/L (ref 135–145)

## 2021-05-12 LAB — CBC
HCT: 41 % (ref 39.0–52.0)
Hemoglobin: 12.8 g/dL — ABNORMAL LOW (ref 13.0–17.0)
MCH: 31.1 pg (ref 26.0–34.0)
MCHC: 31.2 g/dL (ref 30.0–36.0)
MCV: 99.8 fL (ref 80.0–100.0)
Platelets: 140 10*3/uL — ABNORMAL LOW (ref 150–400)
RBC: 4.11 MIL/uL — ABNORMAL LOW (ref 4.22–5.81)
RDW: 13.6 % (ref 11.5–15.5)
WBC: 8.1 10*3/uL (ref 4.0–10.5)
nRBC: 0 % (ref 0.0–0.2)

## 2021-05-12 LAB — GLUCOSE, CAPILLARY
Glucose-Capillary: 185 mg/dL — ABNORMAL HIGH (ref 70–99)
Glucose-Capillary: 141 mg/dL — ABNORMAL HIGH (ref 70–99)
Glucose-Capillary: 118 mg/dL — ABNORMAL HIGH (ref 70–99)
Glucose-Capillary: 153 mg/dL — ABNORMAL HIGH (ref 70–99)
Glucose-Capillary: 105 mg/dL — ABNORMAL HIGH (ref 70–99)

## 2021-05-12 LAB — PROCALCITONIN: Procalcitonin: <0.1 ng/mL

## 2021-05-12 SURGERY — CYSTOSCOPY, WITH RETROGRADE PYELOGRAM AND URETERAL STENT INSERTION
Anesthesia: General | Site: Urethra | Laterality: Left

## 2021-05-12 MED ORDER — CARVEDILOL 3.125 MG PO TABS
3.1250 mg | ORAL_TABLET | Freq: Two times a day (BID) | ORAL | Status: DC
  Administered 2021-05-13: 09:00:00 3.125 mg via ORAL
  Filled 2021-05-12 (×2): qty 1

## 2021-05-12 MED ORDER — DEXAMETHASONE SODIUM PHOSPHATE 10 MG/ML IJ SOLN
INTRAMUSCULAR | Status: AC
  Filled 2021-05-12: qty 1

## 2021-05-12 MED ORDER — CHLORHEXIDINE GLUCONATE 0.12 % MT SOLN
OROMUCOSAL | Status: AC
  Administered 2021-05-12: 20:00:00 15 mL via OROMUCOSAL
  Filled 2021-05-12: qty 15

## 2021-05-12 MED ORDER — CEFAZOLIN SODIUM-DEXTROSE 2-3 GM-%(50ML) IV SOLR
INTRAVENOUS | Status: DC | PRN
  Administered 2021-05-12: 2 g via INTRAVENOUS

## 2021-05-12 MED ORDER — ORAL CARE MOUTH RINSE: 15.0000 mL | Freq: Once | OROMUCOSAL | Status: AC

## 2021-05-12 MED ORDER — IOHEXOL 300 MG/ML  SOLN
INTRAMUSCULAR | Status: DC | PRN
  Administered 2021-05-12: 21:00:00 3 mL

## 2021-05-12 MED ORDER — ONDANSETRON HCL 4 MG/2ML IJ SOLN
INTRAMUSCULAR | Status: AC
  Filled 2021-05-12: qty 2

## 2021-05-12 MED ORDER — LIDOCAINE 2% (20 MG/ML) 5 ML SYRINGE
INTRAMUSCULAR | Status: DC | PRN
  Administered 2021-05-12: 60 mg via INTRAVENOUS

## 2021-05-12 MED ORDER — PROPOFOL 10 MG/ML IV BOLUS
INTRAVENOUS | Status: DC | PRN
  Administered 2021-05-12: 100 mg via INTRAVENOUS

## 2021-05-12 MED ORDER — SODIUM CHLORIDE 0.9 % IR SOLN
Status: DC | PRN
  Administered 2021-05-12: 5000 mL via INTRAVESICAL

## 2021-05-12 MED ORDER — FENTANYL CITRATE (PF) 250 MCG/5ML IJ SOLN
INTRAMUSCULAR | Status: DC | PRN
  Administered 2021-05-12 (×2): 50 ug via INTRAVENOUS

## 2021-05-12 MED ORDER — FENTANYL CITRATE (PF) 250 MCG/5ML IJ SOLN
INTRAMUSCULAR | Status: AC
  Filled 2021-05-12: qty 5

## 2021-05-12 MED ORDER — ONDANSETRON HCL 4 MG/2ML IJ SOLN
INTRAMUSCULAR | Status: DC | PRN
  Administered 2021-05-12: 4 mg via INTRAVENOUS

## 2021-05-12 MED ORDER — CHLORHEXIDINE GLUCONATE 0.12 % MT SOLN: 15.0000 mL | Freq: Once | OROMUCOSAL | Status: AC

## 2021-05-12 MED ORDER — SODIUM CHLORIDE 0.9 % IV SOLN: INTRAVENOUS | Status: DC

## 2021-05-12 MED ORDER — PHENYLEPHRINE 40 MCG/ML (10ML) SYRINGE FOR IV PUSH (FOR BLOOD PRESSURE SUPPORT)
PREFILLED_SYRINGE | INTRAVENOUS | Status: DC | PRN
  Administered 2021-05-12: 120 ug via INTRAVENOUS

## 2021-05-12 MED ORDER — FENTANYL CITRATE (PF) 100 MCG/2ML IJ SOLN: 25.0000 ug | INTRAMUSCULAR | Status: DC | PRN

## 2021-05-12 MED ORDER — DEXAMETHASONE SODIUM PHOSPHATE 10 MG/ML IJ SOLN
INTRAMUSCULAR | Status: DC | PRN
  Administered 2021-05-12: 10 mg via INTRAVENOUS

## 2021-05-12 MED ORDER — PHENYLEPHRINE HCL-NACL 20-0.9 MG/250ML-% IV SOLN
INTRAVENOUS | Status: DC | PRN
  Administered 2021-05-12: 75 ug/min via INTRAVENOUS

## 2021-05-12 SURGICAL SUPPLY — 30 items
BAG DRN RND TRDRP ANRFLXCHMBR (UROLOGICAL SUPPLIES) ×1
BAG URINE DRAIN 2000ML AR STRL (UROLOGICAL SUPPLIES) ×4 IMPLANT
BAG URO CATCHER STRL LF (MISCELLANEOUS) ×4 IMPLANT
CATH FOLEY 2WAY 5CC 16FR (CATHETERS) ×3
CATH INTERMIT  6FR 70CM (CATHETERS) ×2 IMPLANT
CATH URET 5FR 28IN CONE TIP (BALLOONS) ×3
CATH URET 5FR 28IN OPEN ENDED (CATHETERS) IMPLANT
CATH URET 5FR 70CM CONE TIP (BALLOONS) IMPLANT
CATH URTH STD 16FR FL 2W DRN (CATHETERS) ×2 IMPLANT
GLOVE BIO SURGEON STRL SZ7 (GLOVE) ×2 IMPLANT
GLOVE OPTIFIT SS 7.0 STRL BRWN (GLOVE) ×2
GLOVE OPTIFIT SS 7.5 STRL LX (GLOVE) ×2 IMPLANT
GLOVE SURG DERMASSURE 7.5 (GLOVE) ×4 IMPLANT
GLOVE SURG ENC TEXT LTX SZ7 (GLOVE) ×4 IMPLANT
GOWN STRL REUS W/TWL LRG LVL3 (GOWN DISPOSABLE) ×6 IMPLANT
GUIDEWIRE ANG ZIPWIRE 038X150 (WIRE) IMPLANT
GUIDEWIRE STR DUAL SENSOR (WIRE) ×8 IMPLANT
IV CATH 14GX2 1/4 (CATHETERS) IMPLANT
IV NS 1000ML (IV SOLUTION) ×9
IV NS 1000ML BAXH (IV SOLUTION) ×2 IMPLANT
IV NS IRRIG 3000ML ARTHROMATIC (IV SOLUTION) ×2 IMPLANT
KIT TURNOVER KIT A (KITS) IMPLANT
MANIFOLD NEPTUNE II (INSTRUMENTS) ×4 IMPLANT
PACK CYSTO (CUSTOM PROCEDURE TRAY) ×4 IMPLANT
SET IRRIG Y TYPE TUR BLADDER L (SET/KITS/TRAYS/PACK) ×2 IMPLANT
STENT CONTOUR 6FRX24X.038 (STENTS) IMPLANT
STENT CONTOUR 6FRX26X.038 (STENTS) ×4 IMPLANT
STENT URET 6FRX26 CONTOUR (STENTS) ×2 IMPLANT
TUBING CONNECTING 10 (TUBING) ×4 IMPLANT
TUBING CONNECTING 10' (TUBING) ×2

## 2021-05-12 NOTE — Progress Notes (Signed)
PROGRESS NOTE                                                                                                                                                                                                             Patient Demographics:    Jeremiah [name], is a 80 y.o. male, DOB - 15-Mar-1941, JQZ:[id number]  Outpatient Primary MD for the patient is [name], MD    LOS - 0  Admit date - 05/11/2021    Chief Complaint  Patient presents with   Abdominal Pain       Brief Narrative (HPI from H&P)   Jeremiah [name] is a 80 y.o. male with medical history significant of BPH; DM; HTN; HLD; OSA on CPAP; AAA; and prostate CA s/p radiation therapy presenting with L flank pain.  He reports that last week he was treated for UTI with hematuria and thought he was getting better.   Subjective:    Jeremiah [name] today has, No headache, No chest pain, No abdominal pain - No Nausea, No new weakness tingling or numbness, improved SOB.   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 He has a 2 mm left ureteric stone which is obstructing, currently on IV fluids and Flomax, stone is small and chances are it would pass, however renal function is somewhat worse, have requested urology to take a look at him and decide whether he will benefit from stent placement versus conservative management for another day.  Continue supportive care with IV fluids and Keflex.  2.  Underlying OSA.  Noncompliant with CPAP.  He received morphine in the ER and developed acute hypoxic respiratory failure.  Likely due to morphine induced sedation worsening his OSA -better with supportive care, avoid further sedation, whenever he sleeps we will try and place him on CPAP, counseled on compliance.  VQ scan was unremarkable.  Continue supplemental oxygen as needed in the daytime with nighttime CPAP.  3.  History of  AAA.  4.2 cm, follows with Dr. [name] vascular surgeon, will place on low-dose beta-blocker and monitor.  4.  History of VP shunt.  Supportive care.  5.  Hypertension.  Low-dose beta-blocker and monitor holding ARB due to AKI.  6.  Dyslipidemia.  Resume Zetia upon discharge.  7.  HX of prostate cancer s/p radiation in 2015.  Supportive care for now.  8.  DM2.  Sliding scale.  Lab Results  Component Value Date   HGBA1C 6.6 (H) 08/23/2020    CBG (last 3)  Recent Labs    05/11/21 1956 05/11/21 2050 05/12/21 0811  GLUCAP 174* 185* 141*         Condition - Extremely Guarded  Family Communication  : To [name] [phone] on 05/12/2021  Code Status :  DNR  Consults  :  Urology - [facility] Dr. Abner Greenspan  PUD Prophylaxis :     Procedures  :     CT - 1. Obstructing 2 mm distal left ureteral stone, near the left UVJ. 2. Multiple subcentimeter nonobstructing right renal stones. 3. Cholelithiasis. 4. Colonic diverticulosis. 5. Stable 4.2 cm x 3.3 cm infrarenal abdominal aortic aneurysm. 6. Aortic atherosclerosis. Aortic Atherosclerosis (ICD10-I70.0).      Disposition Plan  :    Status is: Observation   DVT Prophylaxis  :    enoxaparin (LOVENOX) injection 30 mg Start: 05/11/21 1400  Lab Results  Component Value Date   PLT 140 (L) 05/12/2021    Diet :  Diet Order             Diet Carb Modified Fluid consistency: Thin; Room service appropriate? Yes  Diet effective now                    Inpatient Medications  Scheduled Meds:  aspirin EC  81 mg Oral Daily   cephALEXin  500 mg Oral TID   docusate sodium  100 mg Oral BID   enoxaparin (LOVENOX) injection  30 mg Subcutaneous Q24H   fiber  1 packet Per Tube Daily   insulin aspart  0-5 Units Subcutaneous QHS   insulin aspart  0-9 Units Subcutaneous TID WC   pantoprazole  20 mg Oral QAC breakfast   tamsulosin  0.4 mg Oral Daily   Continuous Infusions:  lactated ringers 75 mL/hr at 05/11/21 1930   PRN  Meds:.acetaminophen **OR** acetaminophen, bisacodyl, hydrALAZINE, melatonin, ondansetron **OR** ondansetron (ZOFRAN) IV, oxyCODONE, polyethylene glycol, senna-docusate  Antibiotics  :    Anti-infectives (From admission, onward)    Start     Dose/Rate Route Frequency Ordered Stop   05/11/21 1600  cephALEXin (KEFLEX) capsule 500 mg        500 mg Oral 3 times daily 05/11/21 1304          Time Spent in minutes  30   Lala Lund M.D on 05/12/2021 at 12:04 PM  To page go to [email or web address]   [facility] -  Office  [phone]  See all Orders from today for further details    Objective:   Vitals:   05/11/21 1939 05/12/21 0000 05/12/21 0400 05/12/21 0800  BP: 125/67 125/78 (!) 115/59 (!) 116/103  Pulse:  81 87 (!) 101  Resp: 16 18 18 18   Temp: 97.8 F (36.6 C) 98.4 F (36.9 C) 98.4 F (36.9 C) (!) 97.4 F (36.3 C)  TempSrc: Oral Oral Oral Temporal  SpO2:  93% 94% 94%    Wt Readings from Last 3 Encounters:  05/07/21 86.2 kg  03/01/21 89.4 kg  08/22/20 83.9 kg     Intake/Output Summary (Last 24 hours) at 05/12/2021 1204 Last data filed at 05/12/2021 0500 Gross per 24 hour  Intake 1005 ml  Output 625 ml  Net 380 ml     Physical Exam  Awake Alert, No new F.N  deficits, Normal affect [place].AT,PERRAL Supple Neck, No JVD,   Symmetrical Chest wall movement, Good air movement bilaterally, CTAB RRR,No Gallops,Rubs or new Murmurs,  +ve B.Sounds, Abd Soft, No tenderness,   No Cyanosis, Clubbing or edema      Data Review:    CBC Recent Labs  Lab 05/11/21 0237 05/11/21 1303 05/12/21 0112  WBC 12.6* 8.8 8.1  HGB 14.6 13.1 12.8*  HCT 46.0 42.5 41.0  PLT 189 151 140*  MCV 98.9 101.2* 99.8  MCH 31.4 31.2 31.1  MCHC 31.7 30.8 31.2  RDW 13.7 13.8 13.6  LYMPHSABS 0.6*  --   --   MONOABS 1.1*  --   --   EOSABS 0.0  --   --   BASOSABS 0.0  --   --     Electrolytes Recent Labs  Lab 05/11/21 0237 05/11/21 1008 05/12/21 0112  NA 135  --  139  K 4.2   --  4.7  CL 105  --  105  CO2 21*  --  26  GLUCOSE 220*  --  112*  BUN 35*  --  42*  CREATININE 2.62*  --  2.92*  CALCIUM 8.9  --  8.4*  BNP  --  41.1  --     ------------------------------------------------------------------------------------------------------------------ No results for input(s): CHOL, HDL, LDLCALC, TRIG, CHOLHDL, LDLDIRECT in the last 72 hours.  Lab Results  Component Value Date   HGBA1C 6.6 (H) 08/23/2020    No results for input(s): TSH, T4TOTAL, T3FREE, THYROIDAB in the last 72 hours.  Invalid input(s): FREET3 ------------------------------------------------------------------------------------------------------------------ ID Labs Recent Labs  Lab 05/11/21 0237 05/11/21 1303 05/12/21 0112  WBC 12.6* 8.8 8.1  PLT 189 151 140*  CREATININE 2.62*  --  2.92*   Cardiac Enzymes No results for input(s): CKMB, TROPONINI, MYOGLOBIN in the last 168 hours.  Invalid input(s): CK  Radiology Reports NM Pulmonary Perfusion  Result Date: 05/11/2021 CLINICAL DATA:  Hypoxia, flank pain, history prostate cancer post radiation therapy, recent UTI with hematuria, chronic cough unchanged, hypertension EXAM: NUCLEAR MEDICINE PERFUSION LUNG SCAN TECHNIQUE: Perfusion images were obtained in multiple projections after intravenous injection of radiopharmaceutical. Ventilation scans intentionally deferred if perfusion scan and chest x-ray adequate for interpretation during COVID 19 epidemic. RADIOPHARMACEUTICALS:  3.95 mCi Tc-12m MAA IV COMPARISON:  Chest radiograph but 05/11/2021 FINDINGS: Minimal irregularity of perfusion at the posterior bases of the lower lobes corresponding minimal basilar atelectasis. No segmental or subsegmental perfusion defects otherwise identified. Elevation of RIGHT diaphragm noted. IMPRESSION: Minimal bibasilar atelectasis. No scintigraphic evidence of pulmonary embolism. Electronically Signed   By: Lavonia Dana M.D.   On: 05/11/2021 14:28   DG Chest  Port 1 View  Result Date: 05/12/2021 CLINICAL DATA:  Shortness of breath today EXAM: PORTABLE CHEST 1 VIEW COMPARISON:  Portable exam 0919 hours compared to 05/11/2021 FINDINGS: VP shunt tubing traverses RIGHT chest. Upper normal heart size. Mediastinal contours and pulmonary vascularity normal. Atherosclerotic calcification aorta. Elevation of RIGHT diaphragm with bowel interposition. Decreased lung volumes with bibasilar atelectasis Remaining lungs clear. No acute infiltrate, pleural effusion or pneumothorax. IMPRESSION: Decreased lung volumes with bibasilar atelectasis. Aortic Atherosclerosis (ICD10-I70.0). Electronically Signed   By: Lavonia Dana M.D.   On: 05/12/2021 09:47   DG Chest Portable 1 View  Result Date: 05/11/2021 CLINICAL DATA:  Shortness of breath EXAM: PORTABLE CHEST 1 VIEW COMPARISON:  Chest x-ray dated August 22, 2020 FINDINGS: Cardiac and mediastinal contours are unchanged. Mild bibasilar opacities, likely due to atelectasis. Unchanged elevation of the right hemidiaphragm.  No evidence of pneumothorax. Partially visualized shunt catheter tubing with no evidence of discontinuity or kinking. IMPRESSION: Mild bibasilar opacities, likely due to atelectasis. Electronically Signed   By: Yetta Glassman M.D.   On: 05/11/2021 09:54   CT Renal Stone Study  Result Date: 05/11/2021 CLINICAL DATA:  Flank pain. EXAM: CT ABDOMEN AND PELVIS WITHOUT CONTRAST TECHNIQUE: Multidetector CT imaging of the abdomen and pelvis was performed following the standard protocol without IV contrast. COMPARISON:  None. FINDINGS: Lower chest: Mild atelectasis is seen within the posterior aspects of the bilateral lung bases. Hepatobiliary: No focal liver abnormality is seen. A thin layer of subcentimeter gallstones is seen within the lumen of an otherwise normal-appearing gallbladder. Pancreas: Unremarkable. No pancreatic ductal dilatation or surrounding inflammatory changes. Spleen: Normal in size without focal  abnormality. Adrenals/Urinary Tract: Adrenal glands are unremarkable. Kidneys are normal, without focal lesions. Multiple subcentimeter nonobstructing renal stones are seen within the right kidney. An ill-defined 2 mm obstructing renal stone is seen within the distal left ureter, near the left UVJ (axial CT image 84, CT series 3). Mild left-sided hydronephrosis and hydroureter are present. Bladder is unremarkable. Stomach/Bowel: Stomach is within normal limits. The appendix is not clearly identified. No evidence of bowel wall thickening, distention, or inflammatory changes. Noninflamed diverticula are seen throughout the descending and sigmoid colon. Vascular/Lymphatic: Aortic atherosclerosis with stable 4.2 cm x 3.3 cm aneurysmal dilatation of the infrarenal abdominal aorta. No enlarged abdominal or pelvic lymph nodes. Reproductive: Multiple prostate radiation implantation seeds are seen. Other: No abdominal wall hernia or abnormality. No abdominopelvic ascites. Musculoskeletal: Bilateral pedicle screws are seen at the levels of L4 and L5. IMPRESSION: 1. Obstructing 2 mm distal left ureteral stone, near the left UVJ. 2. Multiple subcentimeter nonobstructing right renal stones. 3. Cholelithiasis. 4. Colonic diverticulosis. 5. Stable 4.2 cm x 3.3 cm infrarenal abdominal aortic aneurysm. 6. Aortic atherosclerosis. Aortic Atherosclerosis (ICD10-I70.0). Electronically Signed   By: Virgina Norfolk M.D.   On: 05/11/2021 03:12

## 2021-05-12 NOTE — Evaluation (Signed)
Physical Therapy Evaluation/ Discharge Patient Details Name: Jeremiah [name] MRN: [id number] DOB: 1941-05-10 Today's Date: 05/12/2021  History of Present Illness  Pt is a 80 y/o male admitted 05/11/21 with L flank pain.  Pt reports recent UTI.  Found with nephrolithiasis, AKI and ARF requiring supplemental O2. PMH includes: BPH, DM, HTN, OSA on CPAP, AAA, prostate CA s/p raditation.  Clinical Impression  PT pleasant and reports caring for wife, does not use DME or O2 at baseline. Pt able to initiate gait on RA with drop to 87% after 20' requiring grossly 4 min standing rest and initial 4L to recover to 94% but then able to maintain 94% during gait on 2L. Pt moving well without physical assist and educated for supplemental O2 need and highly encouraged to purchase home pulse ox for use as well as implement walking program for cardiopulmonary health. No further acute needs with pt encouraged to walk acutely with assist for lines and agreeable to no further needs.        Recommendations for follow up therapy are one component of a multi-disciplinary discharge planning process, led by the attending physician.  Recommendations may be updated based on patient status, additional functional criteria and insurance authorization.  Follow Up Recommendations No PT follow up    Assistance Recommended at Discharge None  Functional Status Assessment Patient has not had a recent decline in their functional status  Equipment Recommendations  None recommended by PT    Recommendations for Other Services       Precautions / Restrictions Precautions Precautions: Other (comment) Precaution Comments: watch O2 Restrictions Weight Bearing Restrictions: No      Mobility  Bed Mobility Overal bed mobility: Independent             General bed mobility comments: sit to supine independent    Transfers Overall transfer level: Modified independent                       Ambulation/Gait Ambulation/Gait assistance: Modified independent (Device/Increase time) Gait Distance (Feet): 400 Feet Assistive device: None Gait Pattern/deviations: WFL(Within Functional Limits)   Gait velocity interpretation: >2.62 ft/sec, indicative of community ambulatory   General Gait Details: pt with stable gait without AD or assist on 2L to maintain 94%  Stairs Stairs: Yes Stairs assistance: Modified independent (Device/Increase time) Stair Management: Alternating pattern;Forwards;One rail Right Number of Stairs: 3    Wheelchair Mobility    Modified Rankin (Stroke Patients Only)       Balance Overall balance assessment: No apparent balance deficits (not formally assessed)                                           Pertinent Vitals/Pain Pain Assessment: 0-10 Pain Score: 4  Pain Location: left flank pain Pain Descriptors / Indicators: Aching;Guarding Pain Intervention(s): Limited activity within patient's tolerance;Monitored during session;Repositioned    Home Living Family/patient expects to be discharged to:: Private residence Living Arrangements: Spouse/significant other Available Help at Discharge: Family;Available 24 hours/day Type of Home: House Home Access: Stairs to enter Entrance Stairs-Rails: Can reach both;Left;Right Entrance Stairs-Number of Steps: 2 Alternate Level Stairs-Number of Steps: stays on main level Home Layout: Multi-level;Able to live on main level with bedroom/bathroom Home Equipment: Shower seat;Grab bars - tub/shower;Hand held shower head;Cane - single point;Rolling Walker (2 wheels) Additional Comments: spouse has dementia, has CNA to help 2x/ week  and family PRN    Prior Function Prior Level of Function : Independent/Modified Independent               ADLs Comments: drives, cares for spouse (mostly cog)     Hand Dominance   Dominant Hand: Right    Extremity/Trunk Assessment   Upper Extremity  Assessment Upper Extremity Assessment: Overall WFL for tasks assessed    Lower Extremity Assessment Lower Extremity Assessment: Overall WFL for tasks assessed    Cervical / Trunk Assessment Cervical / Trunk Assessment: Normal  Communication   Communication: No difficulties  Cognition Arousal/Alertness: Awake/alert Behavior During Therapy: WFL for tasks assessed/performed Overall Cognitive Status: Within Functional Limits for tasks assessed                                          General Comments General comments (skin integrity, edema, etc.): VSS on 4L [place] (90-91% during activity)    Exercises     Assessment/Plan    PT Assessment Patient does not need any further PT services  PT Problem List         PT Treatment Interventions      PT Goals (Current goals can be found in the Care Plan section)  Acute Rehab PT Goals PT Goal Formulation: All assessment and education complete, DC therapy    Frequency     Barriers to discharge        Co-evaluation               AM-PAC PT "6 Clicks" Mobility  Outcome Measure Help needed turning from your back to your side while in a flat bed without using bedrails?: None Help needed moving from lying on your back to sitting on the side of a flat bed without using bedrails?: None Help needed moving to and from a bed to a chair (including a wheelchair)?: None Help needed standing up from a chair using your arms (e.g., wheelchair or bedside chair)?: None Help needed to walk in hospital room?: None Help needed climbing 3-5 steps with a railing? : None 6 Click Score: 24    End of Session Equipment Utilized During Treatment: Oxygen Activity Tolerance: Patient tolerated treatment well Patient left: in bed;with call bell/phone within reach Nurse Communication: Mobility status PT Visit Diagnosis: Other abnormalities of gait and mobility (R26.89)    Time: 9470-9628 PT Time Calculation (min) (ACUTE ONLY): 18  min   Charges:   PT Evaluation $PT Eval Low Complexity: 1 Low          Ryanna Teschner P, PT Acute Rehabilitation Services Pager: [phone] Office: [place] Shalev Helminiak 05/12/2021, 11:30 AM

## 2021-05-12 NOTE — Plan of Care (Signed)

## 2021-05-12 NOTE — Consult Note (Signed)
Urology Consult   Physician requesting consult: Lala Lund, MD  Reason for consult: Urolithiasis  History of Present Illness: Jeremiah [name] is a 80 y.o. presented the ED on 05/11/2021 and acute left flank pain.  He reported that week prior, he noticed some hematuria and was treated for urinary tract infection.  He developed worsening pain over the past 2 days.  He denies fevers or chills.  His pain is well controlled presently.  CT A/P 05/11/2021 with 2 mm distal left ureteral stone with mild left hydronephrosis.  Urinalysis without sign of infection.  He does have underlying chronic kidney disease with baseline creatinine 2.1-2.4.  He was admitted for observation.  After fluid resuscitation, his creatinine was worse at 2.92 from 1.62 yesterday.  He follows with Dr. 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  He underwent EBRT with brachytherapy in 04/2014.  Most recent PSA in 12/2020 was undetectable.  At baseline, he has no significant lower urinary tract symptoms.  He denies taking alpha blockers.  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   S/P radiation therapy 04/23/13 - 05/29/14   Prostate/seminal vesicles, external beam 4500 cGy in 25 sessions   Seasonal allergies    Skin cancer    scalp   Wears glasses     Past Surgical History:  Procedure Laterality Date   BACK SURGERY  2009   COLONOSCOPY W/ BIOPSIES AND POLYPECTOMY     [place]   LEFT HEART CATH AND CORONARY ANGIOGRAPHY N/A 03/14/2019   Procedure: LEFT HEART CATH AND CORONARY ANGIOGRAPHY;  Surgeon: [name], MD;  Location: [facility];   Service: Cardiovascular;  Laterality: N/A;   PROSTATE BIOPSY  2013, 2014, 01/2014   Gleason 7   RADIOACTIVE SEED IMPLANT N/A 07/01/2014   Procedure: RADIOACTIVE SEED IMPLANT;  Surgeon: [name], MD;  Location: [facility];  Service: Urology;  Laterality: N/A;  DR PORTABLE   TONSILLECTOMY     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   Procedure: SHUNT INSERTION VENTRICULAR-PERITONEAL;  Surgeon: [name], MD;  Location: [place] [facility];  Service: Neurosurgery;  Laterality: N/A;  SHUNT INSERTION VENTRICULAR-PERITONEAL    Current Hospital Medications:  Home Meds:  No current facility-administered medications on file prior to encounter.   Current Outpatient Medications on File Prior to Encounter  Medication Sig Dispense Refill   acetaminophen (TYLENOL) 500 MG tablet Take 1,000 mg by mouth every 6 (six) hours as needed for mild pain.     aspirin EC 81 MG tablet Take 81 mg by mouth daily.      cephALEXin (KEFLEX) 500 MG capsule Take 1 capsule (500 mg total) by mouth 3 (three) times daily. 21 capsule 0   cholecalciferol (VITAMIN D3) 25 MCG (1000 UNIT) tablet Take 1,000 Units by mouth daily.     ezetimibe (ZETIA) 10 MG tablet Take 10 mg by mouth daily.     losartan (COZAAR) 25 MG tablet Take 25 mg by mouth at bedtime.     pantoprazole (PROTONIX) 20 MG tablet Take 20 mg by  mouth daily before breakfast.      potassium chloride (K-DUR) 10 MEQ tablet Take 10 mEq by mouth 2 (two) times daily.   2   vitamin B-12 (CYANOCOBALAMIN) 250 MCG tablet Take 250 mcg by mouth daily.       Scheduled Meds:  aspirin EC  81 mg Oral Daily   carvedilol  3.125 mg Oral BID WC   cephALEXin  500 mg Oral TID   docusate sodium  100 mg Oral BID   enoxaparin (LOVENOX) injection  30 mg Subcutaneous Q24H   fiber  1 packet Per Tube Daily   insulin aspart  0-5 Units Subcutaneous QHS   insulin aspart  0-9 Units Subcutaneous TID WC   pantoprazole  20 mg Oral QAC breakfast   tamsulosin  0.4 mg Oral Daily    Continuous Infusions:  lactated ringers 75 mL/hr at 05/12/21 1500   PRN Meds:.acetaminophen **OR** acetaminophen, bisacodyl, hydrALAZINE, melatonin, ondansetron **OR** ondansetron (ZOFRAN) IV, oxyCODONE, polyethylene glycol, senna-docusate  Allergies:  Allergies  Allergen Reactions   Lipitor [Atorvastatin] Other (See Comments)    Hip pain    Family History  Problem Relation Age of Onset   Heart attack Father 63   Cancer Father        prostate   Diabetes Father    Cancer Paternal Uncle        prostate   Dementia Mother 105   Lung cancer Brother     Social History:  reports that he quit smoking about 60 years ago. His smoking use included cigarettes. He has never used smokeless tobacco. He reports current alcohol use of about 1.0 standard drink per week. He reports that he does not use drugs.  ROS: A complete review of systems was performed.  All systems are negative except for pertinent findings as noted.  Physical Exam:  Vital signs in last 24 hours: Temp:  [97.4 F (36.3 C)-98.9 F (37.2 C)] 98.9 F (37.2 C) (12/22 1229) Pulse Rate:  [75-101] 93 (12/22 1229) Resp:  [16-20] 20 (12/22 1229) BP: (110-139)/(59-103) 110/60 (12/22 1229) SpO2:  [89 %-97 %] 89 % (12/22 1229) Constitutional:  Alert and oriented, No acute distress Cardiovascular: Regular rate and rhythm Respiratory: Normal respiratory effort, Lungs clear bilaterally GI: Abdomen is soft, nontender, nondistended, no abdominal masses GU: No CVA tenderness Neurologic: Grossly intact, no focal deficits Psychiatric: Normal mood and affect  Laboratory Data:  Recent Labs    05/11/21 0237 05/11/21 1303 05/12/21 0112  WBC 12.6* 8.8 8.1  HGB 14.6 13.1 12.8*  HCT 46.0 42.5 41.0  PLT 189 151 140*    Recent Labs    05/11/21 0237 05/12/21 0112  NA 135 139  K 4.2 4.7  CL 105 105  GLUCOSE 220* 112*  BUN 35* 42*  CALCIUM 8.9 8.4*  CREATININE 2.62* 2.92*     Results for orders placed or performed  during the hospital encounter of 05/11/21 (from the past 24 hour(s))  CBG monitoring, ED     Status: Abnormal   Collection Time: 05/11/21  5:28 PM  Result Value Ref Range   Glucose-Capillary 150 (H) 70 - 99 mg/dL  Glucose, capillary     Status: Abnormal   Collection Time: 05/11/21  7:56 PM  Result Value Ref Range   Glucose-Capillary 174 (H) 70 - 99 mg/dL  Glucose, capillary     Status: Abnormal   Collection Time: 05/11/21  8:50 PM  Result Value Ref Range   Glucose-Capillary 185 (H) 70 - 99 mg/dL  Basic metabolic panel     Status: Abnormal   Collection Time: 05/12/21  1:12 AM  Result Value Ref Range   Sodium 139 135 - 145 mmol/L   Potassium 4.7 3.5 - 5.1 mmol/L   Chloride 105 98 - 111 mmol/L   CO2 26 22 - 32 mmol/L   Glucose, Bld 112 (H) 70 - 99 mg/dL   BUN 42 (H) 8 - 23 mg/dL   Creatinine, Ser 2.92 (H) 0.61 - 1.24 mg/dL   Calcium 8.4 (L) 8.9 - 10.3 mg/dL   GFR, Estimated 21 (L) >60 mL/min   Anion gap 8 5 - 15  CBC     Status: Abnormal   Collection Time: 05/12/21  1:12 AM  Result Value Ref Range   WBC 8.1 4.0 - 10.5 K/uL   RBC 4.11 (L) 4.22 - 5.81 MIL/uL   Hemoglobin 12.8 (L) 13.0 - 17.0 g/dL   HCT 41.0 39.0 - 52.0 %   MCV 99.8 80.0 - 100.0 fL   MCH 31.1 26.0 - 34.0 pg   MCHC 31.2 30.0 - 36.0 g/dL   RDW 13.6 11.5 - 15.5 %   Platelets 140 (L) 150 - 400 K/uL   nRBC 0.0 0.0 - 0.2 %  Glucose, capillary     Status: Abnormal   Collection Time: 05/12/21  8:11 AM  Result Value Ref Range   Glucose-Capillary 141 (H) 70 - 99 mg/dL  Glucose, capillary     Status: Abnormal   Collection Time: 05/12/21 12:28 PM  Result Value Ref Range   Glucose-Capillary 118 (H) 70 - 99 mg/dL  Procalcitonin - Baseline     Status: None   Collection Time: 05/12/21 12:30 PM  Result Value Ref Range   Procalcitonin <0.10 ng/mL   Recent Results (from the past 240 hour(s))  Urine Culture     Status: None   Collection Time: 05/07/21  3:14 PM   Specimen: Urine, Clean Catch  Result Value Ref Range  Status   Specimen Description   Final    URINE, CLEAN CATCH Performed at [facility], [address], [place], [place] [address]    Special Requests   Final    NONE Performed at [facility], [address], [place], [place] [address]    Culture   Final    NO GROWTH Performed at [facility], [place] [address], [place], [place] [address]    Report Status 05/08/2021 FINAL  Final  Resp Panel by RT-PCR (Flu A&B, Covid) Nasopharyngeal Swab     Status: None   Collection Time: 05/11/21 10:11 AM   Specimen: Nasopharyngeal Swab; Nasopharyngeal(NP) swabs in vial transport medium  Result Value Ref Range Status   SARS Coronavirus 2 by RT PCR NEGATIVE NEGATIVE Final    Comment: (NOTE) SARS-CoV-2 target nucleic acids are NOT DETECTED.  The SARS-CoV-2 RNA is generally detectable in upper respiratory specimens during the acute phase of infection. The lowest concentration of SARS-CoV-2 viral copies this assay can detect is 138 copies/mL. A negative result does not preclude SARS-Cov-2 infection and should not be used as the sole basis for treatment or other patient management decisions. A negative result may occur with  improper specimen collection/handling, submission of specimen other than nasopharyngeal swab, presence of viral mutation(s) within the areas targeted by this assay, and inadequate number of viral copies(<138 copies/mL). A negative result must be combined with clinical observations, patient history, and epidemiological information. The expected result is Negative.  Fact Sheet for Patients:  EntrepreneurPulse.com.au  Fact Sheet for Healthcare Providers:  IncredibleEmployment.be  This test is no t yet approved or cleared by the [place] FDA and  has been authorized for detection and/or diagnosis of SARS-CoV-2 by FDA under an Emergency Use Authorization (EUA). This EUA will remain  in effect  (meaning this test can be used) for the duration of the COVID-19 declaration under Section 564(b)(1) of the Act, 21 U.S.C.section 360bbb-3(b)(1), unless the authorization is terminated  or revoked sooner.       Influenza A by PCR NEGATIVE NEGATIVE Final   Influenza B by PCR NEGATIVE NEGATIVE Final    Comment: (NOTE) The Xpert Xpress SARS-CoV-2/FLU/RSV plus assay is intended as an aid in the diagnosis of influenza from Nasopharyngeal swab specimens and should not be used as a sole basis for treatment. Nasal washings and aspirates are unacceptable for Xpert Xpress SARS-CoV-2/FLU/RSV testing.  Fact Sheet for Patients: EntrepreneurPulse.com.au  Fact Sheet for Healthcare Providers: IncredibleEmployment.be  This test is not yet approved or cleared by the [place] FDA and has been authorized for detection and/or diagnosis of SARS-CoV-2 by FDA under an Emergency Use Authorization (EUA). This EUA will remain in effect (meaning this test can be used) for the duration of the COVID-19 declaration under Section 564(b)(1) of the Act, 21 U.S.C. section 360bbb-3(b)(1), unless the authorization is terminated or revoked.  Performed at [facility], [place] [address], [place], [place] [address]     Renal Function: Recent Labs    05/11/21 6767 05/12/21 0112  CREATININE 2.62* 2.92*   Estimated Creatinine Clearance: 21.6 mL/min (A) (by C-G formula based on SCr of 2.92 mg/dL (H)).  Radiologic Imaging: NM Pulmonary Perfusion  Result Date: 05/11/2021 CLINICAL DATA:  Hypoxia, flank pain, history prostate cancer post radiation therapy, recent UTI with hematuria, chronic cough unchanged, hypertension EXAM: NUCLEAR MEDICINE PERFUSION LUNG SCAN TECHNIQUE: Perfusion images were obtained in multiple projections after intravenous injection of radiopharmaceutical. Ventilation scans intentionally deferred if perfusion scan and chest x-ray adequate for  interpretation during COVID 19 epidemic. RADIOPHARMACEUTICALS:  3.95 mCi Tc-39m MAA IV COMPARISON:  Chest radiograph but 05/11/2021 FINDINGS: Minimal irregularity of perfusion at the posterior bases of the lower lobes corresponding minimal basilar atelectasis. No segmental or subsegmental perfusion defects otherwise identified. Elevation of RIGHT diaphragm noted. IMPRESSION: Minimal bibasilar atelectasis. No scintigraphic evidence of pulmonary embolism. Electronically Signed   By: Lavonia Dana M.D.   On: 05/11/2021 14:28   DG Chest Port 1 View  Result Date: 05/12/2021 CLINICAL DATA:  Shortness of breath today EXAM: PORTABLE CHEST 1 VIEW COMPARISON:  Portable exam 0919 hours compared to 05/11/2021 FINDINGS: VP shunt tubing traverses RIGHT chest. Upper normal heart size. Mediastinal contours and pulmonary vascularity normal. Atherosclerotic calcification aorta. Elevation of RIGHT diaphragm with bowel interposition. Decreased lung volumes with bibasilar atelectasis Remaining lungs clear. No acute infiltrate, pleural effusion or pneumothorax. IMPRESSION: Decreased lung volumes with bibasilar atelectasis. Aortic Atherosclerosis (ICD10-I70.0). Electronically Signed   By: Lavonia Dana M.D.   On: 05/12/2021 09:47   DG Chest Portable 1 View  Result Date: 05/11/2021 CLINICAL DATA:  Shortness of breath EXAM: PORTABLE CHEST 1 VIEW COMPARISON:  Chest x-ray dated August 22, 2020 FINDINGS: Cardiac and mediastinal contours are unchanged. Mild bibasilar opacities, likely due to atelectasis. Unchanged elevation of the right hemidiaphragm. No evidence of pneumothorax. Partially visualized shunt catheter tubing with no evidence of discontinuity or kinking. IMPRESSION: Mild bibasilar opacities, likely due to atelectasis. Electronically Signed   By: Hosie Poisson.D.  On: 05/11/2021 09:54   CT Renal Stone Study  Result Date: 05/11/2021 CLINICAL DATA:  Flank pain. EXAM: CT ABDOMEN AND PELVIS WITHOUT CONTRAST TECHNIQUE:  Multidetector CT imaging of the abdomen and pelvis was performed following the standard protocol without IV contrast. COMPARISON:  None. FINDINGS: Lower chest: Mild atelectasis is seen within the posterior aspects of the bilateral lung bases. Hepatobiliary: No focal liver abnormality is seen. A thin layer of subcentimeter gallstones is seen within the lumen of an otherwise normal-appearing gallbladder. Pancreas: Unremarkable. No pancreatic ductal dilatation or surrounding inflammatory changes. Spleen: Normal in size without focal abnormality. Adrenals/Urinary Tract: Adrenal glands are unremarkable. Kidneys are normal, without focal lesions. Multiple subcentimeter nonobstructing renal stones are seen within the right kidney. An ill-defined 2 mm obstructing renal stone is seen within the distal left ureter, near the left UVJ (axial CT image 84, CT series 3). Mild left-sided hydronephrosis and hydroureter are present. Bladder is unremarkable. Stomach/Bowel: Stomach is within normal limits. The appendix is not clearly identified. No evidence of bowel wall thickening, distention, or inflammatory changes. Noninflamed diverticula are seen throughout the descending and sigmoid colon. Vascular/Lymphatic: Aortic atherosclerosis with stable 4.2 cm x 3.3 cm aneurysmal dilatation of the infrarenal abdominal aorta. No enlarged abdominal or pelvic lymph nodes. Reproductive: Multiple prostate radiation implantation seeds are seen. Other: No abdominal wall hernia or abnormality. No abdominopelvic ascites. Musculoskeletal: Bilateral pedicle screws are seen at the levels of L4 and L5. IMPRESSION: 1. Obstructing 2 mm distal left ureteral stone, near the left UVJ. 2. Multiple subcentimeter nonobstructing right renal stones. 3. Cholelithiasis. 4. Colonic diverticulosis. 5. Stable 4.2 cm x 3.3 cm infrarenal abdominal aortic aneurysm. 6. Aortic atherosclerosis. Aortic Atherosclerosis (ICD10-I70.0). Electronically Signed   By: Virgina Norfolk M.D.   On: 05/11/2021 03:12    I independently reviewed the above imaging studies.  Impression/Recommendation Distal left ureteral stone: CT A/P 05/11/2021 with 2 mm obstructing distal left ureteral stone with mild left hydronephrosis.  Afebrile, no sign of infection.  Pain controlled.  Sign of AKI on CKDIII with recent creatinine 2.92 from baseline 2.1-2.48. 2.  AKI on CKD: Creatinine 2.92 on day of consultation from 2.6 to the day prior  -I discussed with patient's options including continued medical expulsive therapy.  We did discuss that he has about an 80% chance of passing stone given the small stone at the left UVJ.  We also discussed options of left ureteral stent placement with attempt at treating his stone at a later date.  We also discussed primary ureteroscopy with laser lithotripsy.  Urine culture 05/07/2021 resulted no growth. -He elected proceed with primary ureteroscopy with laser lithotripsy or possible basket extraction of stone.  I think this is a reasonable and safe choice given the distal location of the stone and small size of the stone.  We discussed rare risk of fevers or sepsis after the case.  We also discussed rare risk of injury.  He has been n.p.o.  We will add on for case today. --The risks, benefits and alternatives of cystoscopy with left URS/LL, L JJ stent placement was discussed with the patient.  Risks include, but are not limited to: bleeding, urinary tract infection, ureteral injury, ureteral stricture disease, chronic pain, urinary symptoms, bladder injury, stent migration, the need for nephrostomy tube placement, MI, CVA, DVT, PE and the inherent risks with general anesthesia.  The patient voices understanding and wishes to proceed.   Matt R. Bobak Oguinn MD 05/12/2021, 3:58 PM  [facility]  Pager: [phone]  CC: Lala Lund, MD

## 2021-05-12 NOTE — Progress Notes (Signed)
SATURATION QUALIFICATIONS: (This note is used to comply with regulatory documentation for home oxygen)  Patient Saturations on Room Air at Rest = 94%  Patient Saturations on Room Air while Ambulating = 87%  Patient Saturations on 2 Liters of oxygen while Ambulating = 94%  Please briefly explain why patient needs home oxygen:Pt with desaturation on RA with gait requiring 2L to maintain sats >90% Bayard Males, PT Acute Rehabilitation Services Pager: [phone] Office: [phone]

## 2021-05-12 NOTE — Evaluation (Signed)
Occupational Therapy Evaluation Patient Details Name: Jeremiah [name] MRN: [id number] DOB: 04/10/1941 Today's Date: 05/12/2021   History of Present Illness Pt is a 80 y/o male admitted 05/11/21 with L flank pain.  Pt reports recent UTI.  Found with nephrolithiasis, AKI and ARF requiring supplemental O2. PMH includes: BPH, DM, HTN, OSA on CPAP, AAA, prostate CA s/p raditation.   Clinical Impression   PTA patient independent. Admitted for above and limited by decreased activity tolerance, generalized weakness.  He currently requires 4L supplemental O2 via [place] during ADLs with SpO2 90-91%.  Completing transfers and ADLs with supervision.  Discussed energy conservation techniques.  Pt will benefit from further OT services acutely to optimize independence, safety and return to PLOF with ADLs and IADLs.  Will follow.      Recommendations for follow up therapy are one component of a multi-disciplinary discharge planning process, led by the attending physician.  Recommendations may be updated based on patient status, additional functional criteria and insurance authorization.   Follow Up Recommendations  No OT follow up    Assistance Recommended at Discharge PRN  Functional Status Assessment  Patient has had a recent decline in their functional status and demonstrates the ability to make significant improvements in function in a reasonable and predictable amount of time.  Equipment Recommendations  None recommended by OT    Recommendations for Other Services PT consult     Precautions / Restrictions Precautions Precautions: Fall Precaution Comments: watch O2 Restrictions Weight Bearing Restrictions: No      Mobility Bed Mobility               General bed mobility comments: OOB in recliner upon entry    Transfers                          Balance Overall balance assessment: Mild deficits observed, not formally tested                                          ADL either performed or assessed with clinical judgement   ADL Overall ADL's : Needs assistance/impaired     Grooming: Supervision/safety;Standing           Upper Body Dressing : Set up;Sitting   Lower Body Dressing: Supervision/safety;Sit to/from stand   Toilet Transfer: Min guard;Supervision/safety;Ambulation Toilet Transfer Details (indicate cue type and reason): min guard initally fading to supervision         Functional mobility during ADLs: Min guard;Supervision/safety General ADL Comments: mild unsteadiness, mobility to sink with no AD.  On 4L with VSS with SPO2 90-91%.     Vision         Perception     Praxis      Pertinent Vitals/Pain Pain Assessment: No/denies pain     Hand Dominance Right   Extremity/Trunk Assessment Upper Extremity Assessment Upper Extremity Assessment: Overall WFL for tasks assessed   Lower Extremity Assessment Lower Extremity Assessment: Defer to PT evaluation       Communication Communication Communication: No difficulties   Cognition Arousal/Alertness: Awake/alert Behavior During Therapy: WFL for tasks assessed/performed Overall Cognitive Status: Within Functional Limits for tasks assessed  General Comments  VSS on 4L [place] (90-91% during activity)    Exercises     Shoulder Instructions      Home Living Family/patient expects to be discharged to:: Private residence Living Arrangements: Spouse/significant other Available Help at Discharge: Family;Available 24 hours/day Type of Home: House Home Access: Stairs to enter [employer] of Steps: 2 Entrance Stairs-Rails: Can reach both Home Layout: Multi-level;Able to live on main level with bedroom/bathroom Alternate Level Stairs-Number of Steps: stays on main level   Bathroom Shower/Tub: Occupational psychologist: Standard     Home Equipment: Shower seat;Grab bars - tub/shower;Hand  held shower head;Cane - single point;Rolling Environmental consultant (2 wheels)   Additional Comments: spouse has dementia, has CNA to help 2x/ week and family PRN      Prior Functioning/Environment Prior Level of Function : Independent/Modified Independent               ADLs Comments: drives, cares for spouse (mostly cog)        OT Problem List: Decreased activity tolerance;Cardiopulmonary status limiting activity      OT Treatment/Interventions: Self-care/ADL training;Energy conservation;Therapeutic activities;Patient/family education    OT Goals(Current goals can be found in the care plan section) Acute Rehab OT Goals Patient Stated Goal: home OT Goal Formulation: With patient Time For Goal Achievement: 05/26/21 Potential to Achieve Goals: Good  OT Frequency: Min 2X/week   Barriers to D/C:            Co-evaluation              AM-PAC OT "6 Clicks" Daily Activity     Outcome Measure Help from another person eating meals?: None Help from another person taking care of personal grooming?: A Little Help from another person toileting, which includes using toliet, bedpan, or urinal?: A Little Help from another person bathing (including washing, rinsing, drying)?: A Little Help from another person to put on and taking off regular upper body clothing?: A Little Help from another person to put on and taking off regular lower body clothing?: A Little 6 Click Score: 19   End of Session Equipment Utilized During Treatment: Oxygen Nurse Communication: Mobility status  Activity Tolerance: Patient tolerated treatment well Patient left: in chair;with call bell/phone within reach  OT Visit Diagnosis: Muscle weakness (generalized) (M62.81)                Time: 5732-2025 OT Time Calculation (min): 29 min Charges:  OT General Charges $OT Visit: 1 Visit OT Evaluation $OT Eval Low Complexity: 1 Low OT Treatments $Self Care/Home Management : 8-22 mins  Jolaine Artist, OT Acute  Rehabilitation Services Pager [phone] Office [phone]   Delight Stare 05/12/2021, 10:55 AM

## 2021-05-12 NOTE — Progress Notes (Signed)
OT Cancellation Note  Patient Details Name: Jeremiah [name] MRN: [id number] DOB: 1940/06/25   Cancelled Treatment:    Reason Eval/Treat Not Completed: Patient at procedure or test/ unavailable- chest xray in room. Will see as able.   Jolaine Artist, OT Acute Rehabilitation Services Pager [phone] Office [phone]   Delight Stare 05/12/2021, 9:17 AM

## 2021-05-12 NOTE — Transfer of Care (Signed)
Immediate Anesthesia Transfer of Care Note  Patient: Jeremiah [name]  Procedure(s) Performed: CYSTOSCOPY WITH RETROGRADE PYELOGRAM/ LEFT URETERAL STENT PLACEMENT. (Left: Urethra)  Patient Location: PACU  Anesthesia Type:General  Level of Consciousness: awake, alert  and oriented  Airway & Oxygen Therapy: Patient connected to nasal cannula oxygen  Post-op Assessment: Report given to RN and Post -op Vital signs reviewed and stable  Post vital signs: Reviewed and stable  Last Vitals:  Vitals Value Taken Time  BP 145/79 05/12/21 2136  Temp    Pulse 105 05/12/21 2139  Resp 17 05/12/21 2139  SpO2 93 % 05/12/21 2139  Vitals shown include unvalidated device data.  Last Pain:  Vitals:   05/12/21 1937  TempSrc: Oral  PainSc: 0-No pain         Complications: No notable events documented.

## 2021-05-12 NOTE — Anesthesia Procedure Notes (Signed)
Procedure Name: LMA Insertion Date/Time: 05/12/2021 8:43 PM Performed by: Valetta Fuller, CRNA Pre-anesthesia Checklist: Patient identified, Emergency Drugs available, Suction available and Patient being monitored Patient Re-evaluated:Patient Re-evaluated prior to induction Oxygen Delivery Method: Circle system utilized Preoxygenation: Pre-oxygenation with 100% oxygen Induction Type: IV induction Ventilation: Mask ventilation without difficulty LMA: LMA inserted LMA Size: 4.0 Number of attempts: 3 Placement Confirmation: positive ETCO2 Tube secured with: Tape Dental Injury: Teeth and Oropharynx as per pre-operative assessment

## 2021-05-12 NOTE — Progress Notes (Signed)
Full consult note to follow: I called pt to discuss case. Evidence of small left ureteral stone, mild hydro and AKI on CKD. Discussed options of MET, vs stenting vs primary treatment. Ucx negative. He elects for URS/LL/stent. Will add on for today. Keep NPO. Discussed with OR.  Matt R. [facility]  Pager: [phone]

## 2021-05-12 NOTE — Anesthesia Postprocedure Evaluation (Signed)
Anesthesia Post Note  Patient: Jeremiah [name]  Procedure(s) Performed: CYSTOSCOPY WITH RETROGRADE PYELOGRAM/ LEFT URETERAL STENT PLACEMENT. (Left: Urethra)     Patient location during evaluation: PACU Anesthesia Type: General Level of consciousness: awake and alert Pain management: pain level controlled Vital Signs Assessment: post-procedure vital signs reviewed and stable Respiratory status: spontaneous breathing, nonlabored ventilation, respiratory function stable and patient connected to nasal cannula oxygen Cardiovascular status: blood pressure returned to baseline and stable Postop Assessment: no apparent nausea or vomiting Anesthetic complications: no   No notable events documented.  Last Vitals:  Vitals:   05/12/21 2215 05/12/21 2300  BP: 130/78   Pulse: 90   Resp: 16   Temp: 37.1 C 36.7 C  SpO2: 99%     Last Pain:  Vitals:   05/12/21 2215  TempSrc:   PainSc: 0-No pain                 Prakriti Carignan,W. EDMOND

## 2021-05-12 NOTE — Progress Notes (Signed)
Patient alert and oriented,in stable condition. OR staff present at bedside to transport patient. Patient left the room at this time to OR for stent placement.

## 2021-05-12 NOTE — Progress Notes (Signed)
RN + patient family at bedside BP 131/72. Patient appears calm and free of distress. Patient does not complain of pain at this time. At this time, patient not have been sent to procedure. Per OR department, they are behind schedule and plain to see the patient before end of day. MD notified. Report to be given to oncoming RN.Will continue to monitor

## 2021-05-12 NOTE — Op Note (Signed)
Operative Note  Preoperative diagnosis:  1.  Left distal ureteral stone 2. AKI  Postoperative diagnosis: 1.  Left distal ureteral stone stone 2. AKI  Procedure(s): 1.  Cystoscopy 2. Left diasostic ureteroscopy  3. Left retrograde pyelogram 4. Left ureteral stent placement 5. Fluoroscopy with intraoperative interpretation  Surgeon: Rexene Alberts, MD  Assistants:  None  Anesthesia:  General  Complications:  None  EBL:  Minimal  Specimens: None  Drains/Catheters: 1.  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  No suspicious bladder lesions. Left retrograde pyelogram demonstrated minimal left hydronephrosis Left diagnostic ureteroscopy demonstrated no evidence of stone in the distal portion of the ureter.  I was unable to advance a flexible ureteroscope beyond the level of the distal left ureter.  Unfortunately, we do not have a dual-lumen catheter or 8 French/10 French ureteral dilator.  We also did not have a single-lumen ureteroscope available. Successful left ureteral stent placement.  Indication:  Jeremiah [name] is a 80 y.o. male with CT A/P 05/11/2021 with 52mm distal left ureteral stone with AKI. He was observed with worsening AKI. He had a negative urine culture and he elected to proceed with primary ureteroscopy with laser lithotripsy and basket extraction of stone.  Description of procedure: After informed consent was obtained from the patient, the patient was identified and taken to the operating room and placed in the supine position.  General anesthesia was administered as well as perioperative IV antibiotics.  At the beginning of the case, a time-out was performed to properly identify the patient, the surgery to be performed, and the surgical site.  Sequential compression devices were applied to the  lower extremities at the beginning of the case for DVT prophylaxis.  The patient was then placed in the dorsal lithotomy supine position, prepped and draped in sterile fashion.  Preliminary scout fluoroscopy revealed no evidence of calcification. We then passed the 21-French rigid cystoscope through the urethra and into the bladder under vision without any difficulty , noting a normal urethra with wide caliber bulbar urethral stricture easily passable with scope and a moderately obstructing prostate.  A systematic evaluation of the bladder revealed no evidence of any suspicious bladder lesions.  Ureteral orifices were in normal position.    Under cystoscopic and flouroscopic guidance, we cannulated the left ureteral orifice with a 0.038 sensor wire and passed this up to the level of the renal pelvis.  I then navigated a semirigid ureteroscope into the distal left ureter.  There was some slight narrowing in the intramural portion.  I did not encounter a stone.  I passed this as proximal as I could go without any ability to identify stone.  I then passed a separate 0.038 sensor wire and passed up to the level of the kidney.  Over this wire, I attempted to pass the dual-lumen flexible digital ureteroscope.  Unfortunately this did not pass beyond the ureter.  We did not have a dual-lumen catheter available.  Further we do not have an 8 French/10 French ureteral dilator.  We do not have a single-lumen ureteroscope available which is a narrow caliber scope that possibly would have been able to bypass this area.  I then reinserted the semirigid scope and passed this as proximal as I could identified no evidence of any stone in the course of the ureter.  There is no evidence of any stone  in the bladder.  A 5-French open-ended ureteral catheter and a gentle retrograde pyelogram was performed, revealing a normal caliber ureter without any filling defects. There was mild left hydronephrosis of the collecting system. A  0.038 sensor wire was then passed up to the level of the renal pelvis.  Once the ureteroscope was removed, the Glidewire was backloaded through the rigid cystoscope, which was then advanced down the urethra and into the bladder. We then used the Glidewire under direct vision through the rigid cystoscope and under fluoroscopic guidance and passed up a 6-French, 26 cm double-pigtail ureteral stent up ureter, making sure that the proximal and distal ends coiled within the renal pelvis and bladder respectively.  I did not leave a tether string.  The patient tolerated the procedure well and there was no complication. Patient was awoken from anesthesia and taken to the recovery room in stable condition. I was present and scrubbed for the entirety of the case.  Plan: With ureteral stent in place, his AKI should improve.  It is possible that he passed a stone.  Is also possible the stone was passed up to the level of the kidney with the initial passage of the sensor wire.  I was unable to advance beyond the proximal left ureter as the flexible digital ureteroscope would not pass and we did not have dilators available at [facility].  I will notify Dr. Lovena Neighbours of his admission.  We would likely plan for stent removal in the office.  Given his small size stone with 2 mm and passive dilation with the stent, if any fragment remains, they would likely pass.  Further, we could repeat imaging to see if stone is present prior to removal.  Ultimately, he can be taken back to the operating room for second look ureteroscopy after allowing for passive dilation.  These findings were discussed with his daughter, [name].  Matt R. [facility]  Pager: [phone]

## 2021-05-12 NOTE — Anesthesia Preprocedure Evaluation (Addendum)
Anesthesia Evaluation  Patient identified by MRN, date of birth, ID band Patient awake    Reviewed: Allergy & Precautions, H&P , NPO status , Patient's Chart, lab work & pertinent test results  Airway Mallampati: III  TM Distance: >3 FB Neck ROM: Full    Dental no notable dental hx. (+) Teeth Intact, Dental Advisory Given   Pulmonary sleep apnea , former smoker,    Pulmonary exam normal breath sounds clear to auscultation       Cardiovascular hypertension, Pt. on medications  Rhythm:Regular Rate:Normal     Neuro/Psych negative neurological ROS  negative psych ROS   GI/Hepatic negative GI ROS, Neg liver ROS,   Endo/Other  diabetes, Well Controlled  Renal/GU Renal disease  negative genitourinary   Musculoskeletal  (+) Arthritis , Osteoarthritis,    Abdominal   Peds  Hematology negative hematology ROS (+)   Anesthesia Other Findings   Reproductive/Obstetrics negative OB ROS                            Anesthesia Physical Anesthesia Plan  ASA: 3  Anesthesia Plan: General   Post-op Pain Management: Minimal or no pain anticipated   Induction: Intravenous  PONV Risk Score and Plan: 3 and Ondansetron, Dexamethasone and Treatment may vary due to age or medical condition  Airway Management Planned: LMA  Additional Equipment:   Intra-op Plan:   Post-operative Plan: Extubation in OR  Informed Consent: I have reviewed the patients History and Physical, chart, labs and discussed the procedure including the risks, benefits and alternatives for the proposed anesthesia with the patient or authorized representative who has indicated his/her understanding and acceptance.   Patient has DNR.  Discussed DNR with patient and Suspend DNR.   Dental advisory given  Plan Discussed with: CRNA  Anesthesia Plan Comments:        Anesthesia Quick Evaluation

## 2021-05-12 NOTE — Progress Notes (Signed)
°  Transition of Care [facility]) Screening Note   Patient Details  Name: Jeremiah [name] Date of Birth: [birth year]   Transition of Care [facility]) CM/SW Contact:    Cyndi Bender, RN Phone Number: 05/12/2021, 8:52 AM    Transition of Care Department [facility]) has reviewed patient and no TOC needs have been identified at this time. We will continue to monitor patient advancement through interdisciplinary progression rounds. If new patient transition needs arise, please place a TOC consult.

## 2021-05-13 ENCOUNTER — Other Ambulatory Visit (HOSPITAL_COMMUNITY): Payer: Self-pay

## 2021-05-13 ENCOUNTER — Encounter (HOSPITAL_COMMUNITY): Payer: Self-pay | Admitting: Urology

## 2021-05-13 LAB — COMPREHENSIVE METABOLIC PANEL
ALT: 16 U/L (ref 0–44)
AST: 25 U/L (ref 15–41)
Albumin: 2.7 g/dL — ABNORMAL LOW (ref 3.5–5.0)
Alkaline Phosphatase: 56 U/L (ref 38–126)
Anion gap: 6 (ref 5–15)
BUN: 36 mg/dL — ABNORMAL HIGH (ref 8–23)
CO2: 27 mmol/L (ref 22–32)
Calcium: 8.3 mg/dL — ABNORMAL LOW (ref 8.9–10.3)
Chloride: 104 mmol/L (ref 98–111)
Creatinine, Ser: 2.1 mg/dL — ABNORMAL HIGH (ref 0.61–1.24)
GFR, Estimated: 31 mL/min — ABNORMAL LOW (ref >60)
Glucose, Bld: 178 mg/dL — ABNORMAL HIGH (ref 70–99)
Potassium: 4.8 mmol/L (ref 3.5–5.1)
Sodium: 137 mmol/L (ref 135–145)
Total Bilirubin: 0.6 mg/dL (ref 0.3–1.2)
Total Protein: 5.5 g/dL — ABNORMAL LOW (ref 6.5–8.1)

## 2021-05-13 LAB — CBC WITH DIFFERENTIAL/PLATELET
Abs Immature Granulocytes: 0.07 10*3/uL (ref 0.00–0.07)
Basophils Absolute: 0 10*3/uL (ref 0.0–0.1)
Basophils Relative: 0 %
Eosinophils Absolute: 0 10*3/uL (ref 0.0–0.5)
Eosinophils Relative: 0 %
HCT: 40.9 % (ref 39.0–52.0)
Hemoglobin: 12.7 g/dL — ABNORMAL LOW (ref 13.0–17.0)
Immature Granulocytes: 1 %
Lymphocytes Relative: 4 %
Lymphs Abs: 0.3 10*3/uL — ABNORMAL LOW (ref 0.7–4.0)
MCH: 30.8 pg (ref 26.0–34.0)
MCHC: 31.1 g/dL (ref 30.0–36.0)
MCV: 99.3 fL (ref 80.0–100.0)
Monocytes Absolute: 0.2 10*3/uL (ref 0.1–1.0)
Monocytes Relative: 2 %
Neutro Abs: 6.5 10*3/uL (ref 1.7–7.7)
Neutrophils Relative %: 93 %
Platelets: 145 10*3/uL — ABNORMAL LOW (ref 150–400)
RBC: 4.12 MIL/uL — ABNORMAL LOW (ref 4.22–5.81)
RDW: 13.3 % (ref 11.5–15.5)
WBC: 7.1 10*3/uL (ref 4.0–10.5)
nRBC: 0 % (ref 0.0–0.2)

## 2021-05-13 LAB — GLUCOSE, CAPILLARY: Glucose-Capillary: 199 mg/dL — ABNORMAL HIGH (ref 70–99)

## 2021-05-13 LAB — PROCALCITONIN: Procalcitonin: <0.1 ng/mL

## 2021-05-13 LAB — BRAIN NATRIURETIC PEPTIDE: B Natriuretic Peptide: 43.1 pg/mL (ref 0.0–100.0)

## 2021-05-13 LAB — MAGNESIUM: Magnesium: 1.9 mg/dL (ref 1.7–2.4)

## 2021-05-13 MED ORDER — OXYCODONE HCL 5 MG PO TABS
5.0000 mg | ORAL_TABLET | Freq: Three times a day (TID) | ORAL | 0 refills | Status: DC | PRN
  Filled 2021-05-13: qty 15, 5d supply, fill #0

## 2021-05-13 MED ORDER — CARVEDILOL 3.125 MG PO TABS
3.1250 mg | ORAL_TABLET | Freq: Two times a day (BID) | ORAL | 0 refills | Status: DC
  Filled 2021-05-13: qty 60, 30d supply, fill #0

## 2021-05-13 MED ORDER — OXYBUTYNIN CHLORIDE ER 10 MG PO TB24: 10.0000 mg | ORAL_TABLET | Freq: Every day | ORAL | 1 refills | Status: AC

## 2021-05-13 MED ORDER — CEPHALEXIN 500 MG PO CAPS
500.0000 mg | ORAL_CAPSULE | Freq: Three times a day (TID) | ORAL | 0 refills | Status: DC
  Filled 2021-05-13: qty 21, 7d supply, fill #0

## 2021-05-13 MED ORDER — ACETAMINOPHEN 500 MG PO TABS
500.0000 mg | ORAL_TABLET | Freq: Four times a day (QID) | ORAL | 0 refills | Status: DC | PRN
  Filled 2021-05-13: qty 30, 8d supply, fill #0

## 2021-05-13 NOTE — Progress Notes (Signed)
Discharge instructions (including medications) discussed with and copy provided to patient/caregiver 

## 2021-05-13 NOTE — Plan of Care (Signed)
°  Problem: Education: Goal: Knowledge of General Education information will improve Description: Including pain rating scale, medication(s)/side effects and non-pharmacologic comfort measures Outcome: Progressing   Problem: Clinical Measurements: Goal: Will remain free from infection Outcome: Progressing Goal: Respiratory complications will improve Outcome: Progressing   Problem: Pain Managment: Goal: General experience of comfort will improve Outcome: Progressing

## 2021-05-13 NOTE — Discharge Summary (Signed)
Jeremiah [name] RFF:[id number] DOB: February 01, 1941 DOA: 05/11/2021  PCP: [name], MD  Admit date: 05/11/2021  Discharge date: 05/13/2021  Admitted From: Home   Disposition:  Home   Recommendations for Outpatient Follow-up:   Follow up with PCP in 1-2 weeks  PCP Please obtain BMP/CBC, 2 view CXR in 1week,  (see Discharge instructions)   PCP Please follow up on the following pending results: Monitor renal function closely.   Home Health: None   Equipment/Devices: as below  Consultations: Urology Discharge Condition: Stable    CODE STATUS: Full    Diet Recommendation: Heart Healthy Low Carb  Diet Order             Diet Carb Modified Fluid consistency: Thin; Room service appropriate? Yes  Diet effective now                    Chief Complaint  Patient presents with   Abdominal Pain     Brief history of present illness from the day of admission and additional interim summary    Jeremiah [name] is a 80 y.o. male with medical history significant of BPH; DM; HTN; HLD; OSA on CPAP; AAA; and prostate CA s/p radiation therapy presenting with L flank pain.  He reports that last week he was treated for UTI with hematuria and thought he was getting better.                                                                 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 He has a 2 mm left ureteric stone which is obstructing, was given IV fluids and Flomax, seen by urology underwent stent placement on 05/12/2022, renal function much improved and close to baseline, continue Flomax, case discussed with Dr. Abner Greenspan urology today, discharged home on few days of oral antibiotics with outpatient urology and PCP follow-up, still has pain and some hematuria upon urination  requested to stay hydrated, few more days of oral Keflex, Flomax, Tylenol and low-dose narcotic for pain.   2.  Underlying OSA.  Noncompliant with CPAP.  He received morphine in the ER and developed acute hypoxic respiratory failure.  Likely due to morphine induced sedation worsening his OSA -VQ scan unremarkable with supportive care back to baseline, upon rest and ambulation currently on room air, counseled on using CPAP at night when sleeping he has a CPAP device at home, as needed home oxygen will be provided if he qualifies for it, minimize narcotic use.   3.  History of AAA.  4.2 cm, follows with Dr. [name] vascular surgeon, will place on low-dose beta-blocker and monitor.   4.  History of VP shunt.  Supportive care.   5.  Hypertension.  Low-dose  beta-blocker and monitor holding ARB and home dose potassium supplementation due to AKI.  PCP to monitor in outpatient setting.   6.  Dyslipidemia.  Resume Zetia upon discharge.   7.  HX of prostate cancer s/p radiation in 2015.  Supportive care for now.   8.  DM2.  Home regimen and follow with PCP.   Discharge diagnosis     Principal Problem:   Nephrolithiasis Active Problems:   Malignant neoplasm of prostate s/p radiation 2015   HLD (hyperlipidemia)   HTN (hypertension)   AAA (abdominal aortic aneurysm)   Acute respiratory failure with hypoxia (HCC)   DNR (do not resuscitate)   DMII (diabetes mellitus, type 2) ([place])   AKI (acute kidney injury) [facility])    Discharge instructions    Discharge Instructions     Discharge instructions   Complete by: As directed    Follow with Primary MD [name], MD and the recommended urologist in 7 days   Get CBC, CMP, 2 view Chest X ray -  checked next visit within 1 week by Primary MD    Activity: As tolerated with Full fall precautions use walker/cane & assistance as needed  Disposition Home    Diet: Heart Healthy Low Carb, keep yourself well hydrated  Special Instructions: If you  have smoked or chewed Tobacco  in the last 2 yrs please stop smoking, stop any regular Alcohol  and or any Recreational drug use.  On your next visit with your primary care physician please Get Medicines reviewed and adjusted.  Please request your Prim.MD to go over all Hospital Tests and Procedure/Radiological results at the follow up, please get all Hospital records sent to your Prim MD by signing hospital release before you go home.  If you experience worsening of your admission symptoms, develop shortness of breath, life threatening emergency, suicidal or homicidal thoughts you must seek medical attention immediately by calling 911 or calling your MD immediately  if symptoms less severe.  You Must read complete instructions/literature along with all the possible adverse reactions/side effects for all the Medicines you take and that have been prescribed to you. Take any new Medicines after you have completely understood and accpet all the possible adverse reactions/side effects.   Increase activity slowly   Complete by: As directed    No wound care   Complete by: As directed        Discharge Medications   Allergies as of 05/13/2021       Reactions   Lipitor [atorvastatin] Other (See Comments)   Hip pain        Medication List     STOP taking these medications    losartan 25 MG tablet Commonly known as: COZAAR   potassium chloride 10 MEQ tablet Commonly known as: KLOR-CON       TAKE these medications    acetaminophen 500 MG tablet Commonly known as: TYLENOL Take 1 tablet (500 mg total) by mouth every 6 (six) hours as needed for mild pain or moderate pain. What changed:  how much to take reasons to take this   aspirin EC 81 MG tablet Take 81 mg by mouth daily.   carvedilol 3.125 MG tablet Commonly known as: COREG Take 1 tablet (3.125 mg total) by mouth 2 (two) times daily with a meal.   cephALEXin 500 MG capsule Commonly known as: KEFLEX Take 1 capsule  (500 mg total) by mouth 3 (three) times daily.   cholecalciferol 25 MCG (1000 UNIT) tablet Commonly known  as: VITAMIN D3 Take 1,000 Units by mouth daily.   ezetimibe 10 MG tablet Commonly known as: ZETIA Take 10 mg by mouth daily.   ondansetron 4 MG disintegrating tablet Commonly known as: ZOFRAN-ODT 4mg  ODT q4 hours prn nausea/vomit   oxybutynin 10 MG 24 hr tablet Commonly known as: Ditropan XL Take 1 tablet (10 mg total) by mouth daily for 60 doses.   oxyCODONE 5 MG immediate release tablet Commonly known as: Oxy IR/ROXICODONE Take 1 tablet (5 mg total) by mouth every 8 (eight) hours as needed for severe pain or breakthrough pain.   pantoprazole 20 MG tablet Commonly known as: PROTONIX Take 20 mg by mouth daily before breakfast.   tamsulosin 0.4 MG Caps capsule Commonly known as: FLOMAX Take 1 capsule (0.4 mg total) by mouth daily after supper. What changed: Another medication with the same name was removed. Continue taking this medication, and follow the directions you see here.   vitamin B-12 250 MCG tablet Commonly known as: CYANOCOBALAMIN Take 250 mcg by mouth daily.               Durable Medical Equipment  (From admission, onward)           Start     Ordered   05/11/21 1031  For home use only DME oxygen  Once       Question Answer Comment  Length of Need 6 Months   Mode or (Route) Nasal cannula   Liters per Minute 2   Frequency Continuous (stationary and portable oxygen unit needed)   Oxygen delivery system Gas      05/11/21 1030             Follow-up Information     [name], MD. Schedule an appointment as soon as possible for a visit in 1 week(s).   Specialty: Internal Medicine Contact information: [place] [address] [phone]         [name], MD. Schedule an appointment as soon as possible for a visit in 1 week(s).   Specialty: Urology Contact information: [place]  [address] [phone]                 Major procedures and Radiology Reports - PLEASE review detailed and final reports thoroughly  -      NM Pulmonary Perfusion  Result Date: 05/11/2021 CLINICAL DATA:  Hypoxia, flank pain, history prostate cancer post radiation therapy, recent UTI with hematuria, chronic cough unchanged, hypertension EXAM: NUCLEAR MEDICINE PERFUSION LUNG SCAN TECHNIQUE: Perfusion images were obtained in multiple projections after intravenous injection of radiopharmaceutical. Ventilation scans intentionally deferred if perfusion scan and chest x-ray adequate for interpretation during COVID 19 epidemic. RADIOPHARMACEUTICALS:  3.95 mCi Tc-35m MAA IV COMPARISON:  Chest radiograph but 05/11/2021 FINDINGS: Minimal irregularity of perfusion at the posterior bases of the lower lobes corresponding minimal basilar atelectasis. No segmental or subsegmental perfusion defects otherwise identified. Elevation of RIGHT diaphragm noted. IMPRESSION: Minimal bibasilar atelectasis. No scintigraphic evidence of pulmonary embolism. Electronically Signed   By: Lavonia Dana M.D.   On: 05/11/2021 14:28   DG Retrograde Pyelogram  Result Date: 05/13/2021 CLINICAL DATA:  Retrograde pyelogram with ureteral stent placement EXAM: RETROGRADE PYELOGRAM FLUOROSCOPY TIME:  34 seconds COMPARISON:  CT abdomen pelvis-05/11/2021 FINDINGS: Two spot intraoperative fluoroscopic images during left-sided retrograde pyelogram and ureteral stent placement are provided for review Initial image demonstrates the superior end of a ureteral stent with faint opacification of several of the left-sided  renal calices which appear nondilated Additional image demonstrates the inferior aspect of a ureteral stent with inferior coil overlying expected location of the urinary bladder. Multiple brachytherapy seeds overlies expected location of the prostate gland. IMPRESSION: Retrograde pyelogram with left-sided ureteral stent placement.  Electronically Signed   By: Sandi Mariscal M.D.   On: 05/13/2021 08:04   DG Chest Port 1 View  Result Date: 05/12/2021 CLINICAL DATA:  Shortness of breath today EXAM: PORTABLE CHEST 1 VIEW COMPARISON:  Portable exam 0919 hours compared to 05/11/2021 FINDINGS: VP shunt tubing traverses RIGHT chest. Upper normal heart size. Mediastinal contours and pulmonary vascularity normal. Atherosclerotic calcification aorta. Elevation of RIGHT diaphragm with bowel interposition. Decreased lung volumes with bibasilar atelectasis Remaining lungs clear. No acute infiltrate, pleural effusion or pneumothorax. IMPRESSION: Decreased lung volumes with bibasilar atelectasis. Aortic Atherosclerosis (ICD10-I70.0). Electronically Signed   By: Lavonia Dana M.D.   On: 05/12/2021 09:47   DG Chest Portable 1 View  Result Date: 05/11/2021 CLINICAL DATA:  Shortness of breath EXAM: PORTABLE CHEST 1 VIEW COMPARISON:  Chest x-ray dated August 22, 2020 FINDINGS: Cardiac and mediastinal contours are unchanged. Mild bibasilar opacities, likely due to atelectasis. Unchanged elevation of the right hemidiaphragm. No evidence of pneumothorax. Partially visualized shunt catheter tubing with no evidence of discontinuity or kinking. IMPRESSION: Mild bibasilar opacities, likely due to atelectasis. Electronically Signed   By: Yetta Glassman M.D.   On: 05/11/2021 09:54   CT Renal Stone Study  Result Date: 05/11/2021 CLINICAL DATA:  Flank pain. EXAM: CT ABDOMEN AND PELVIS WITHOUT CONTRAST TECHNIQUE: Multidetector CT imaging of the abdomen and pelvis was performed following the standard protocol without IV contrast. COMPARISON:  None. FINDINGS: Lower chest: Mild atelectasis is seen within the posterior aspects of the bilateral lung bases. Hepatobiliary: No focal liver abnormality is seen. A thin layer of subcentimeter gallstones is seen within the lumen of an otherwise normal-appearing gallbladder. Pancreas: Unremarkable. No pancreatic ductal dilatation  or surrounding inflammatory changes. Spleen: Normal in size without focal abnormality. Adrenals/Urinary Tract: Adrenal glands are unremarkable. Kidneys are normal, without focal lesions. Multiple subcentimeter nonobstructing renal stones are seen within the right kidney. An ill-defined 2 mm obstructing renal stone is seen within the distal left ureter, near the left UVJ (axial CT image 84, CT series 3). Mild left-sided hydronephrosis and hydroureter are present. Bladder is unremarkable. Stomach/Bowel: Stomach is within normal limits. The appendix is not clearly identified. No evidence of bowel wall thickening, distention, or inflammatory changes. Noninflamed diverticula are seen throughout the descending and sigmoid colon. Vascular/Lymphatic: Aortic atherosclerosis with stable 4.2 cm x 3.3 cm aneurysmal dilatation of the infrarenal abdominal aorta. No enlarged abdominal or pelvic lymph nodes. Reproductive: Multiple prostate radiation implantation seeds are seen. Other: No abdominal wall hernia or abnormality. No abdominopelvic ascites. Musculoskeletal: Bilateral pedicle screws are seen at the levels of L4 and L5. IMPRESSION: 1. Obstructing 2 mm distal left ureteral stone, near the left UVJ. 2. Multiple subcentimeter nonobstructing right renal stones. 3. Cholelithiasis. 4. Colonic diverticulosis. 5. Stable 4.2 cm x 3.3 cm infrarenal abdominal aortic aneurysm. 6. Aortic atherosclerosis. Aortic Atherosclerosis (ICD10-I70.0). Electronically Signed   By: Virgina Norfolk M.D.   On: 05/11/2021 03:12      Today   Subjective    [place] Minor today has no headache,no chest abdominal pain,no new weakness tingling or numbness, feels much better wants to go home today.     Objective   Blood pressure 131/76, pulse 84, temperature (!) 97.5 F (36.4 C), temperature source  Oral, resp. rate 20, SpO2 98 %.   Intake/Output Summary (Last 24 hours) at 05/13/2021 1009 Last data filed at 05/13/2021 0500 Gross per 24  hour  Intake 1772.12 ml  Output 650 ml  Net 1122.12 ml    Exam  Awake Alert, No new F.N deficits, Normal affect [place].AT,PERRAL Supple Neck,No JVD, No cervical lymphadenopathy appriciated.  Symmetrical Chest wall movement, Good air movement bilaterally, CTAB RRR,No Gallops,Rubs or new Murmurs, No Parasternal Heave +ve B.Sounds, Abd Soft, Non tender, No organomegaly appriciated, No rebound -guarding or rigidity. No Cyanosis, Clubbing or edema, No new Rash or bruise   Data Review   CBC w Diff:  Lab Results  Component Value Date   WBC 7.1 05/13/2021   HGB 12.7 (L) 05/13/2021   HCT 40.9 05/13/2021   PLT 145 (L) 05/13/2021   LYMPHOPCT 4 05/13/2021   MONOPCT 2 05/13/2021   EOSPCT 0 05/13/2021   BASOPCT 0 05/13/2021    CMP:  Lab Results  Component Value Date   NA 137 05/13/2021   K 4.8 05/13/2021   CL 104 05/13/2021   CO2 27 05/13/2021   BUN 36 (H) 05/13/2021   CREATININE 2.10 (H) 05/13/2021   CREATININE 1.24 (H) 04/09/2018   PROT 5.5 (L) 05/13/2021   ALBUMIN 2.7 (L) 05/13/2021   BILITOT 0.6 05/13/2021   ALKPHOS 56 05/13/2021   AST 25 05/13/2021   ALT 16 05/13/2021  .   Total Time in preparing paper work, data evaluation and todays exam - 29 minutes  Lala Lund M.D on 05/13/2021 at 10:09 AM  [facility]

## 2021-05-13 NOTE — Progress Notes (Signed)
Pt placed on cpap for the night. °

## 2021-05-13 NOTE — Progress Notes (Signed)
Occupational Therapy Treatment Patient Details Name: Jeremiah [name] MRN: [id number] DOB: June 26, 1940 Today's Date: 05/13/2021   History of present illness Pt is a 80 y/o male admitted 05/11/21 with L flank pain.  Pt reports recent UTI.  Found with nephrolithiasis, AKI and ARF requiring supplemental O2. PMH includes: BPH, DM, HTN, OSA on CPAP, AAA, prostate CA s/p raditation.   OT comments  Patient supine in bed and agreeable to OT. Completing transfers with modified independence, mobility in room with supervision for line mgmt and ADLs with modified independence to supervision. Discussed energy conservation techniques and recommendations for IADLs, pt agreeable.  Reports he will have some support from family/aide for his spouse. Initially on 2L [place] but weaned to RA with SpO2 >94% throughout session. Will follow acutely.    Recommendations for follow up therapy are one component of a multi-disciplinary discharge planning process, led by the attending physician.  Recommendations may be updated based on patient status, additional functional criteria and insurance authorization.    Follow Up Recommendations  No OT follow up    Assistance Recommended at Discharge PRN  Equipment Recommendations  None recommended by OT    Recommendations for Other Services      Precautions / Restrictions Precautions Precautions: Other (comment) Precaution Comments: watch O2 Restrictions Weight Bearing Restrictions: No       Mobility Bed Mobility Overal bed mobility: Independent                  Transfers Overall transfer level: Modified independent                       Balance Overall balance assessment: No apparent balance deficits (not formally assessed)                                         ADL either performed or assessed with clinical judgement   ADL Overall ADL's : Needs assistance/impaired                         Toilet Transfer: Modified  Independent;Ambulation   Toileting- Clothing Manipulation and Hygiene: Modified independent       Functional mobility during ADLs: Supervision/safety General ADL Comments: for line mgmt    Extremity/Trunk Assessment              Vision       Perception     Praxis      Cognition Arousal/Alertness: Awake/alert Behavior During Therapy: WFL for tasks assessed/performed Overall Cognitive Status: Within Functional Limits for tasks assessed                                            Exercises     Shoulder Instructions       General Comments pt on 2L initally with SpO2 98%, transitioned to RA with SPO2 > 94% throughout session; reviewed energy conseravation techniques and provided handout    Pertinent Vitals/ Pain       Pain Assessment: 0-10 Pain Score: 2  Pain Location: when he urinates Pain Descriptors / Indicators: Discomfort Pain Intervention(s): Monitored during session  Home Living  Prior Functioning/Environment              Frequency  Min 2X/week        Progress Toward Goals  OT Goals(current goals can now be found in the care plan section)  Progress towards OT goals: Progressing toward goals  Acute Rehab OT Goals Patient Stated Goal: home OT Goal Formulation: With patient  Plan Discharge plan remains appropriate;Frequency remains appropriate    Co-evaluation                 AM-PAC OT "6 Clicks" Daily Activity     Outcome Measure   Help from another person eating meals?: None Help from another person taking care of personal grooming?: None Help from another person toileting, which includes using toliet, bedpan, or urinal?: None Help from another person bathing (including washing, rinsing, drying)?: A Little Help from another person to put on and taking off regular upper body clothing?: None Help from another person to put on and taking off regular lower  body clothing?: A Little 6 Click Score: 22    End of Session    OT Visit Diagnosis: Muscle weakness (generalized) (M62.81)   Activity Tolerance Patient tolerated treatment well   Patient Left in chair;with call bell/phone within reach   Nurse Communication Mobility status        Time: 2902-1115 OT Time Calculation (min): 26 min  Charges: OT General Charges $OT Visit: 1 Visit OT Treatments $Self Care/Home Management : 23-37 mins  Jolaine Artist, OT Acute Rehabilitation Services Pager [phone] Office [phone]   Delight Stare 05/13/2021, 9:40 AM

## 2021-05-13 NOTE — Plan of Care (Signed)
  Problem: Education: Goal: Knowledge of General Education information will improve Description: Including pain rating scale, medication(s)/side effects and non-pharmacologic comfort measures Outcome: Adequate for Discharge   

## 2021-05-13 NOTE — TOC Progression Note (Signed)
Transition of Care [facility]) - Progression Note    Patient Details  Name: Jeremiah [name] MRN: [id number] Date of Birth: [birth year]  [facility] of [facility] CM/SW [place], RN Phone [phone]  05/13/2021, 12:29 PM  Clinical Narrative:    O2 orders and saturation screen documented on 12/21 and documentation reflects that O2 referral called to [employer] and was to be delivered. CM following up with Nurse Chester Holstein patient has been discharged. Cm notified [employer] of patients discharge. CM spoke with daughter who states that she picked her dad up from the hospital and that they were currently on their way to his house and that patient does not have an oxygen tank with him. Patient and daughter stated they were not aware that patient would need home O2. Cm spoke with Shelia at [facility] who is requesting stat O2 set up to be delivered to the home. Daughter states that patient is in no distress but will sit and wait for O2 to be delivered. TOC will sign off        Expected Discharge Plan and Services           Expected Discharge Date: 05/13/21                                     Social Determinants of Health (SDOH) Interventions    Readmission Risk Interventions No flowsheet data found.

## 2021-05-13 NOTE — Progress Notes (Signed)
Patient back to room from PACU at 2245, alert and stable no sign of distress noted.

## 2021-05-13 NOTE — Progress Notes (Signed)
Patient underwent Kidney stent placement in the earlier part of this shift.. Patient voiding bright red urine, complaing of pain while urinating. Pain controlled with prn oxycodone 5mg  , melatonin 10mg  also given for sleep, these meds were effective. Episodes of O2 desaturation noted to low 80s RT was notified for CPAP, patient was monitored with intermittent episodes of desaturation while asleep, RT made aware.  Other vital signs remain stable .Will continue to monitor.

## 2021-05-24 DIAGNOSIS — N1831 Chronic kidney disease, stage 3a: Secondary | ICD-10-CM | POA: Diagnosis not present

## 2021-05-24 DIAGNOSIS — E785 Hyperlipidemia, unspecified: Secondary | ICD-10-CM | POA: Diagnosis not present

## 2021-05-24 DIAGNOSIS — I714 Abdominal aortic aneurysm, without rupture, unspecified: Secondary | ICD-10-CM | POA: Diagnosis not present

## 2021-05-24 DIAGNOSIS — N390 Urinary tract infection, site not specified: Secondary | ICD-10-CM | POA: Diagnosis not present

## 2021-05-24 DIAGNOSIS — I10 Essential (primary) hypertension: Secondary | ICD-10-CM | POA: Diagnosis not present

## 2021-05-24 DIAGNOSIS — E1151 Type 2 diabetes mellitus with diabetic peripheral angiopathy without gangrene: Secondary | ICD-10-CM | POA: Diagnosis not present

## 2021-05-24 DIAGNOSIS — E876 Hypokalemia: Secondary | ICD-10-CM | POA: Diagnosis not present

## 2021-05-24 DIAGNOSIS — N200 Calculus of kidney: Secondary | ICD-10-CM | POA: Diagnosis not present

## 2021-05-27 ENCOUNTER — Encounter (HOSPITAL_BASED_OUTPATIENT_CLINIC_OR_DEPARTMENT_OTHER): Payer: Self-pay | Admitting: *Deleted

## 2021-05-27 ENCOUNTER — Inpatient Hospital Stay (HOSPITAL_BASED_OUTPATIENT_CLINIC_OR_DEPARTMENT_OTHER)
Admission: EM | Admit: 2021-05-27 | Discharge: 2021-05-30 | DRG: 389 | Disposition: A | Discharge status: Home / Self Care | Payer: Medicare Other | Attending: Family Medicine | Admitting: Family Medicine

## 2021-05-27 ENCOUNTER — Emergency Department (HOSPITAL_BASED_OUTPATIENT_CLINIC_OR_DEPARTMENT_OTHER): Payer: Medicare Other

## 2021-05-27 ENCOUNTER — Other Ambulatory Visit: Payer: Self-pay

## 2021-05-27 DIAGNOSIS — Z20822 Contact with and (suspected) exposure to covid-19: Secondary | ICD-10-CM | POA: Diagnosis present

## 2021-05-27 DIAGNOSIS — I517 Cardiomegaly: Secondary | ICD-10-CM | POA: Diagnosis not present

## 2021-05-27 DIAGNOSIS — I129 Hypertensive chronic kidney disease with stage 1 through stage 4 chronic kidney disease, or unspecified chronic kidney disease: Secondary | ICD-10-CM | POA: Diagnosis present

## 2021-05-27 DIAGNOSIS — K8020 Calculus of gallbladder without cholecystitis without obstruction: Secondary | ICD-10-CM | POA: Diagnosis not present

## 2021-05-27 DIAGNOSIS — N400 Enlarged prostate without lower urinary tract symptoms: Secondary | ICD-10-CM | POA: Diagnosis present

## 2021-05-27 DIAGNOSIS — G4733 Obstructive sleep apnea (adult) (pediatric): Secondary | ICD-10-CM | POA: Diagnosis present

## 2021-05-27 DIAGNOSIS — R0902 Hypoxemia: Secondary | ICD-10-CM | POA: Diagnosis not present

## 2021-05-27 DIAGNOSIS — J9811 Atelectasis: Secondary | ICD-10-CM | POA: Diagnosis present

## 2021-05-27 DIAGNOSIS — K56609 Unspecified intestinal obstruction, unspecified as to partial versus complete obstruction: Secondary | ICD-10-CM | POA: Diagnosis not present

## 2021-05-27 DIAGNOSIS — K219 Gastro-esophageal reflux disease without esophagitis: Secondary | ICD-10-CM | POA: Diagnosis not present

## 2021-05-27 DIAGNOSIS — Z9981 Dependence on supplemental oxygen: Secondary | ICD-10-CM | POA: Diagnosis not present

## 2021-05-27 DIAGNOSIS — Z888 Allergy status to other drugs, medicaments and biological substances status: Secondary | ICD-10-CM

## 2021-05-27 DIAGNOSIS — Z801 Family history of malignant neoplasm of trachea, bronchus and lung: Secondary | ICD-10-CM

## 2021-05-27 DIAGNOSIS — Z8546 Personal history of malignant neoplasm of prostate: Secondary | ICD-10-CM | POA: Diagnosis not present

## 2021-05-27 DIAGNOSIS — Z923 Personal history of irradiation: Secondary | ICD-10-CM | POA: Diagnosis not present

## 2021-05-27 DIAGNOSIS — Z85828 Personal history of other malignant neoplasm of skin: Secondary | ICD-10-CM

## 2021-05-27 DIAGNOSIS — I7143 Infrarenal abdominal aortic aneurysm, without rupture: Secondary | ICD-10-CM | POA: Diagnosis present

## 2021-05-27 DIAGNOSIS — E1165 Type 2 diabetes mellitus with hyperglycemia: Secondary | ICD-10-CM | POA: Diagnosis present

## 2021-05-27 DIAGNOSIS — Z981 Arthrodesis status: Secondary | ICD-10-CM | POA: Diagnosis not present

## 2021-05-27 DIAGNOSIS — K5730 Diverticulosis of large intestine without perforation or abscess without bleeding: Secondary | ICD-10-CM | POA: Diagnosis not present

## 2021-05-27 DIAGNOSIS — K5939 Other megacolon: Secondary | ICD-10-CM | POA: Diagnosis not present

## 2021-05-27 DIAGNOSIS — N184 Chronic kidney disease, stage 4 (severe): Secondary | ICD-10-CM | POA: Diagnosis not present

## 2021-05-27 DIAGNOSIS — N200 Calculus of kidney: Secondary | ICD-10-CM | POA: Diagnosis not present

## 2021-05-27 DIAGNOSIS — Z982 Presence of cerebrospinal fluid drainage device: Secondary | ICD-10-CM | POA: Diagnosis not present

## 2021-05-27 DIAGNOSIS — Z79899 Other long term (current) drug therapy: Secondary | ICD-10-CM | POA: Diagnosis not present

## 2021-05-27 DIAGNOSIS — I10 Essential (primary) hypertension: Secondary | ICD-10-CM | POA: Diagnosis present

## 2021-05-27 DIAGNOSIS — Z87442 Personal history of urinary calculi: Secondary | ICD-10-CM | POA: Diagnosis not present

## 2021-05-27 DIAGNOSIS — I714 Abdominal aortic aneurysm, without rupture, unspecified: Secondary | ICD-10-CM | POA: Diagnosis present

## 2021-05-27 DIAGNOSIS — E785 Hyperlipidemia, unspecified: Secondary | ICD-10-CM | POA: Diagnosis present

## 2021-05-27 DIAGNOSIS — Z66 Do not resuscitate: Secondary | ICD-10-CM | POA: Diagnosis present

## 2021-05-27 DIAGNOSIS — Z833 Family history of diabetes mellitus: Secondary | ICD-10-CM

## 2021-05-27 DIAGNOSIS — Z4682 Encounter for fitting and adjustment of non-vascular catheter: Secondary | ICD-10-CM | POA: Diagnosis not present

## 2021-05-27 DIAGNOSIS — E1122 Type 2 diabetes mellitus with diabetic chronic kidney disease: Secondary | ICD-10-CM | POA: Diagnosis present

## 2021-05-27 DIAGNOSIS — K56600 Partial intestinal obstruction, unspecified as to cause: Secondary | ICD-10-CM | POA: Diagnosis not present

## 2021-05-27 DIAGNOSIS — Z87891 Personal history of nicotine dependence: Secondary | ICD-10-CM | POA: Diagnosis not present

## 2021-05-27 DIAGNOSIS — Z8249 Family history of ischemic heart disease and other diseases of the circulatory system: Secondary | ICD-10-CM

## 2021-05-27 DIAGNOSIS — K6389 Other specified diseases of intestine: Secondary | ICD-10-CM | POA: Diagnosis not present

## 2021-05-27 DIAGNOSIS — Z7982 Long term (current) use of aspirin: Secondary | ICD-10-CM

## 2021-05-27 DIAGNOSIS — E119 Type 2 diabetes mellitus without complications: Secondary | ICD-10-CM

## 2021-05-27 LAB — COMPREHENSIVE METABOLIC PANEL
ALT: 18 U/L (ref 0–44)
AST: 24 U/L (ref 15–41)
Albumin: 4.2 g/dL (ref 3.5–5.0)
Alkaline Phosphatase: 64 U/L (ref 38–126)
Anion gap: 13 (ref 5–15)
BUN: 37 mg/dL — ABNORMAL HIGH (ref 8–23)
CO2: 32 mmol/L (ref 22–32)
Calcium: 9.4 mg/dL (ref 8.9–10.3)
Chloride: 100 mmol/L (ref 98–111)
Creatinine, Ser: 1.8 mg/dL — ABNORMAL HIGH (ref 0.61–1.24)
GFR, Estimated: 37 mL/min — ABNORMAL LOW (ref >60)
Glucose, Bld: 209 mg/dL — ABNORMAL HIGH (ref 70–99)
Potassium: 3.9 mmol/L (ref 3.5–5.1)
Sodium: 145 mmol/L (ref 135–145)
Total Bilirubin: 1.1 mg/dL (ref 0.3–1.2)
Total Protein: 7.3 g/dL (ref 6.5–8.1)

## 2021-05-27 LAB — CBC
HCT: 50.9 % (ref 39.0–52.0)
Hemoglobin: 16.3 g/dL (ref 13.0–17.0)
MCH: 31 pg (ref 26.0–34.0)
MCHC: 32 g/dL (ref 30.0–36.0)
MCV: 97 fL (ref 80.0–100.0)
Platelets: 211 10*3/uL (ref 150–400)
RBC: 5.25 MIL/uL (ref 4.22–5.81)
RDW: 13.9 % (ref 11.5–15.5)
WBC: 9.2 10*3/uL (ref 4.0–10.5)
nRBC: 0 % (ref 0.0–0.2)

## 2021-05-27 LAB — RESP PANEL BY RT-PCR (FLU A&B, COVID) ARPGX2
Influenza A by PCR: NEGATIVE
Influenza B by PCR: NEGATIVE
SARS Coronavirus 2 by RT PCR: NEGATIVE

## 2021-05-27 LAB — LIPASE, BLOOD: Lipase: 10 U/L — ABNORMAL LOW (ref 11–51)

## 2021-05-27 MED ORDER — ONDANSETRON HCL 4 MG/2ML IJ SOLN
4.0000 mg | Freq: Once | INTRAMUSCULAR | Status: AC
Start: 2021-05-27 — End: 2021-05-27
  Administered 2021-05-27: 4 mg via INTRAVENOUS
  Filled 2021-05-27: qty 2

## 2021-05-27 MED ORDER — LACTATED RINGERS IV BOLUS
500.0000 mL | Freq: Once | INTRAVENOUS | Status: AC
  Administered 2021-05-27: 500 mL via INTRAVENOUS

## 2021-05-27 MED ORDER — ONDANSETRON HCL 4 MG/2ML IJ SOLN
4.0000 mg | Freq: Once | INTRAMUSCULAR | Status: AC
  Administered 2021-05-27: 4 mg via INTRAVENOUS
  Filled 2021-05-27: qty 2

## 2021-05-27 MED ORDER — LACTATED RINGERS IV SOLN: INTRAVENOUS | Status: DC

## 2021-05-27 NOTE — ED Notes (Signed)
Bladder Scan 137 mL

## 2021-05-27 NOTE — ED Notes (Signed)
Pt given oral contrast to drink; scan time approx 1630 if pt is able to tolerate oral contrast

## 2021-05-27 NOTE — H&P (Signed)
History and Physical    Jeremiah [name]:[id number] DOB: 17-Apr-1941 DOA: 05/27/2021  PCP: [name], MD Patient coming from: [place] ED  Chief Complaint: Nausea, vomiting  HPI: Jeremiah [name] is a 81 y.o. male with medical history significant of BPH, type II diabetes, hypertension, hyperlipidemia, history of VP shunt, OSA, GERD, AAA, prostate cancer status post radiation therapy, BPH, CKD stage IIIb-IV.  Admitted last month for obstructing left ureter stone with AKI.  Underwent ureteral stent placement on 05/12/2021.  He presented to the ED today complaining of nausea, vomiting, and loose stools x2 days.  Slightly tachypneic and oxygen saturation in the upper 80s on room air.  He was placed on 2 L supplemental oxygen.  Labs showing no leukocytosis.  Blood glucose 209.  Creatinine 1.8, stable.  No elevation of lipase or LFTs.  UA pending.  COVID and influenza PCR negative.  CT abdomen pelvis showing findings suggestive of an early or partial small bowel obstruction.  No definite transition point.  Nonobstructing right nephrolithiasis.  Left ureteral stent draining well.  Cholelithiasis without acute cholecystitis.  Stable infrarenal abdominal aortic aneurysm measuring up to 4.3 cm. Patient was given Zofran and 500 cc LR bolus.  NG tube placed and 1 L of gastric contents removed.  General surgery consulted and patient started on maintenance IV fluids.  Patient reports 2-day history of nausea, vomiting, and loose stools.  He has not been able to tolerate any p.o. intake.  Denies abdominal pain.  Denies history of any prior abdominal surgeries but does report history of bowel obstruction several years ago.  He is on home oxygen but does not know how many liters.  Denies chest pain, shortness of breath, cough, or fevers.  No other complaints.  Review of Systems:  All systems reviewed and apart from history of presenting illness, are negative.  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   S/P radiation therapy 04/23/13 - 05/29/14   Prostate/seminal vesicles, external beam 4500 cGy in 25 sessions   Seasonal allergies    Skin cancer    scalp   Wears glasses     Past Surgical History:  Procedure Laterality Date   BACK SURGERY  2009   COLONOSCOPY W/ BIOPSIES AND POLYPECTOMY     CYSTOSCOPY W/ URETERAL STENT PLACEMENT Left 05/12/2021   Procedure: CYSTOSCOPY WITH RETROGRADE PYELOGRAM/ LEFT URETERAL STENT PLACEMENT.;  Surgeon: [name], MD;  Location: [facility];  Service: Urology;  Laterality: Left;   [place]   LEFT HEART CATH AND CORONARY ANGIOGRAPHY N/A 03/14/2019   Procedure: LEFT HEART CATH AND CORONARY ANGIOGRAPHY;  Surgeon: [name], MD;  Location: [facility];  Service: Cardiovascular;  Laterality: N/A;   PROSTATE BIOPSY  2013, 2014, 01/2014   Gleason 7   RADIOACTIVE SEED IMPLANT N/A 07/01/2014   Procedure: RADIOACTIVE SEED IMPLANT;  Surgeon: [name], MD;  Location: [facility];  Service: Urology;  Laterality: N/A;  DR PORTABLE   TONSILLECTOMY     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   Procedure: SHUNT INSERTION  VENTRICULAR-PERITONEAL;  Surgeon: [name], MD;  Location: [facility];  Service: Neurosurgery;  Laterality: N/A;  SHUNT INSERTION VENTRICULAR-PERITONEAL     reports that he quit smoking about 60 years ago. His smoking use included cigarettes. He has never used smokeless tobacco. He reports current alcohol use of about 1.0 standard drink per week. He reports that he does not use drugs.  Allergies  Allergen Reactions   Lipitor [Atorvastatin] Other (See Comments)    Hip pain     Family History  Problem Relation Age of Onset   Heart attack Father 69   Cancer Father        prostate   Diabetes Father    Cancer Paternal Uncle        prostate   Dementia Mother 53   Lung cancer Brother     Prior to Admission medications   Medication Sig Start Date End Date Taking? Authorizing Provider  acetaminophen (TYLENOL) 500 MG tablet Take 1 tablet (500 mg total) by mouth every 6 (six) hours as needed for mild pain or moderate pain. 05/13/21  Yes Thurnell Lose, MD  aspirin EC 81 MG tablet Take 81 mg by mouth daily.    Yes [provider]  carvedilol (COREG) 3.125 MG tablet Take 1 tablet (3.125 mg total) by mouth 2 (two) times daily with a meal. 05/13/21  Yes Thurnell Lose, MD  cholecalciferol (VITAMIN D3) 25 MCG (1000 UNIT) tablet Take 1,000 Units by mouth daily.   Yes [provider]  ezetimibe (ZETIA) 10 MG tablet Take 10 mg by mouth daily. 05/02/21  Yes [provider]  oxybutynin (DITROPAN XL) 10 MG 24 hr tablet Take 1 tablet (10 mg total) by mouth daily for 60 doses. 05/13/21 07/12/21 Yes [name], MD  pantoprazole (PROTONIX) 20 MG tablet Take 20 mg by mouth daily before breakfast.  02/19/19  Yes [provider]  tamsulosin (FLOMAX) 0.4 MG CAPS capsule Take 1 capsule (0.4 mg total) by mouth daily after supper. 05/11/21  Yes Deno Etienne, DO  vitamin B-12 (CYANOCOBALAMIN) 250 MCG tablet Take 250 mcg by mouth daily.   Yes [provider]  cephALEXin (KEFLEX) 500 MG capsule Take 1 capsule (500 mg total) by mouth 3 (three) times daily. Patient not taking: Reported on 05/27/2021 05/13/21   Thurnell Lose, MD  ondansetron (ZOFRAN-ODT) 4 MG disintegrating tablet 4mg  ODT q4 hours prn nausea/vomit Patient not taking: Reported on 05/27/2021 05/11/21   Deno Etienne, DO  oxyCODONE (OXY IR/ROXICODONE) 5 MG immediate release tablet Take 1 tablet (5 mg total) by mouth every 8 (eight) hours as needed for severe pain or breakthrough  pain. Patient not taking: Reported on 05/27/2021 05/13/21   Thurnell Lose, MD    Physical Exam: Vitals:   05/27/21 1953 05/27/21 2119 05/27/21 2121 05/28/21 0122  BP: (!) 147/95 (!) 141/81  (!) 133/93  Pulse: 92 95  91  Resp: (!) 28 20  16   Temp:  98.7 F (37.1 C)  97.8 F (36.6 C)  TempSrc:  Oral  Oral  SpO2: 95% 90%  91%  Weight:   85.2 kg   Height:   5\' 10"  (1.778 m)     Physical Exam Constitutional:      General: He is not in acute distress. HENT:     Head: Normocephalic and atraumatic.  Eyes:     Extraocular Movements: Extraocular movements intact.     Conjunctiva/sclera: Conjunctivae normal.  Cardiovascular:     Rate and  Rhythm: Normal rate and regular rhythm.     Pulses: Normal pulses.  Pulmonary:     Effort: Pulmonary effort is normal. No respiratory distress.     Breath sounds: No wheezing or rales.  Abdominal:     General: Bowel sounds are normal. There is no distension.     Palpations: Abdomen is soft.     Tenderness: There is no abdominal tenderness.  Musculoskeletal:        General: No swelling or tenderness.     Cervical back: Normal range of motion and neck supple.  Skin:    General: Skin is warm and dry.  Neurological:     General: No focal deficit present.     Mental Status: He is alert and oriented to person, place, and time.     Labs on Admission: I have personally reviewed following labs and imaging studies  CBC: Recent Labs  Lab 05/27/21 1146  WBC 9.2  HGB 16.3  HCT 50.9  MCV 97.0  PLT 062   Basic Metabolic Panel: Recent Labs  Lab 05/27/21 1146  NA 145  K 3.9  CL 100  CO2 32  GLUCOSE 209*  BUN 37*  CREATININE 1.80*  CALCIUM 9.4   GFR: Estimated Creatinine Clearance: 33.2 mL/min (A) (by C-G formula based on SCr of 1.8 mg/dL (H)). Liver Function Tests: Recent Labs  Lab 05/27/21 1146  AST 24  ALT 18  ALKPHOS 64  BILITOT 1.1  PROT 7.3  ALBUMIN 4.2   Recent Labs  Lab 05/27/21 1146  LIPASE 10*   No results for  input(s): AMMONIA in the last 168 hours. Coagulation Profile: No results for input(s): INR, PROTIME in the last 168 hours. Cardiac Enzymes: No results for input(s): CKTOTAL, CKMB, CKMBINDEX, TROPONINI in the last 168 hours. BNP (last 3 results) No results for input(s): PROBNP in the last 8760 hours. HbA1C: No results for input(s): HGBA1C in the last 72 hours. CBG: No results for input(s): GLUCAP in the last 168 hours. Lipid Profile: No results for input(s): CHOL, HDL, LDLCALC, TRIG, CHOLHDL, LDLDIRECT in the last 72 hours. Thyroid Function Tests: No results for input(s): TSH, T4TOTAL, FREET4, T3FREE, THYROIDAB in the last 72 hours. Anemia Panel: No results for input(s): VITAMINB12, FOLATE, FERRITIN, TIBC, IRON, RETICCTPCT in the last 72 hours. Urine analysis:    Component Value Date/Time   COLORURINE YELLOW 05/11/2021 0226   APPEARANCEUR HAZY (A) 05/11/2021 0226   LABSPEC 1.025 05/11/2021 0226   PHURINE 5.0 05/11/2021 0226   GLUCOSEU 50 (A) 05/11/2021 0226   HGBUR MODERATE (A) 05/11/2021 0226   BILIRUBINUR NEGATIVE 05/11/2021 0226   KETONESUR NEGATIVE 05/11/2021 0226   PROTEINUR 30 (A) 05/11/2021 0226   UROBILINOGEN 1.0 08/25/2008 0326   NITRITE NEGATIVE 05/11/2021 0226   LEUKOCYTESUR NEGATIVE 05/11/2021 0226    Radiological Exams on Admission: CT ABDOMEN PELVIS WO CONTRAST  Result Date: 05/27/2021 CLINICAL DATA:  Bowel obstruction suspected. 2 days N/V/D vomiting several times a day as well as diarrhea. Pt drank 60 minutes prior EXAM: CT ABDOMEN AND PELVIS WITHOUT CONTRAST TECHNIQUE: Multidetector CT imaging of the abdomen and pelvis was performed following the standard protocol without IV contrast. COMPARISON:  CT renal 05/11/2021 FINDINGS: Lower chest: Elevated right hemidiaphragm with atelectasis of the right base. Scarring versus atelectasis of bilateral lower lobes. Trace right pleural effusion with a ventricular shunt terminating within the right pleural space. Left  anterior descending coronary calcification. Hepatobiliary: No focal liver abnormality. Calcified gallstone noted within the gallbladder lumen. No gallbladder  wall thickening or pericholecystic fluid. No biliary dilatation. Pancreas: Diffusely atrophic. No focal lesion. Otherwise normal pancreatic contour. No surrounding inflammatory changes. No main pancreatic ductal dilatation. Spleen: Normal in size without focal abnormality. Adrenals/Urinary Tract: No adrenal nodule bilaterally. Multiple calcified stones within the right kidney measuring up to 3 mm. No left nephrolithiasis. A left ureteral stent is noted with proximal pigtail terminating in the region of a decompressed left renal pelvis and proximal pigtail terminating within the urinary bladder lumen.No definite contour-deforming renal mass. No ureterolithiasis or hydroureter. The urinary bladder is unremarkable. Stomach/Bowel: Stomach is within normal limits. The proximal and early mid small bowel are dilated with air and fluid measuring up to 4 cm in caliber. No definite transition point. The mid to distal small bowel is however decompressed. No evidence of large bowel wall thickening or dilatation. Diffuse sigmoid and otherwise scattered colonic diverticulosis. The appendix is not definitely identified with no inflammatory changes in the right lower quadrant to suggest acute appendicitis. 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 This extends approximately 3-4 cm in the craniocaudal dimension. Mild atherosclerotic plaque of the aorta and its branches. No abdominal, pelvic, or inguinal lymphadenopathy. Reproductive: Prostate radiation beads noted. Other: No intraperitoneal free fluid. No intraperitoneal free gas. No organized fluid collection. Musculoskeletal: No abdominal wall hernia or abnormality. No suspicious lytic or blastic osseous lesions. No acute displaced fracture.  Multilevel degenerative changes of the spine. Stable grade 1 L4 on L5 anterolisthesis in a patient status post posterolateral fusion interbody fusion at this level. IMPRESSION: 1. Findings suggestive an early or partial small bowel obstruction. No definite transition point. Differential diagnosis includes ileus. No bowel perforation. 2. Colonic diverticulosis with no acute diverticulitis. 3. Nonobstructive right nephrolithiasis measuring up to 3 mm. 4. Left ureteral stent in a decompressed left collecting system in likely appropriate position. Limited evaluation on this noncontrast study. 5. Cholelithiasis with no acute cholecystitis. 6. 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 Recommend annual imaging followup by CTA or MRA. This recommendation follows 2010 ACCF/AHA/AATS/ACR/ASA/SCA/SCAI/SIR/STS/SVM Guidelines for the Diagnosis and Management of Patients with Thoracic Aortic Disease. Circulation. 2010; 121: Z858-I502. 7. Aortic aneurysm NOS (ICD10-I71.9). Aortic Atherosclerosis (ICD10-I70.0). Electronically Signed   By: Iven Finn M.D.   On: 05/27/2021 16:13   DG Chest Portable 1 View  Result Date: 05/27/2021 CLINICAL DATA:  Nasogastric tube placement EXAM: PORTABLE CHEST 1 VIEW COMPARISON:  Portable exam 1807 hours compared to 05/12/2021 FINDINGS: Nasogastric tube tip projects over proximal stomach. VP shunt tubing traverses RIGHT hemithorax. Bowel interposition between liver and diaphragm. Enlargement of cardiac silhouette. Mediastinal contours and pulmonary vascularity normal. Atherosclerotic calcification aorta. RIGHT basilar atelectasis without pulmonary infiltrate, pleural effusion, or pneumothorax. IMPRESSION: Nasogastric tube projects over proximal stomach. Enlargement of cardiac silhouette with RIGHT basilar atelectasis. Aortic Atherosclerosis (ICD10-I70.0). Electronically Signed   By: Lavonia Dana M.D.   On: 05/27/2021 18:18    Assessment/Plan Principal Problem:   SBO  (small bowel obstruction) (HCC) Active Problems:   HTN (hypertension)   AAA (abdominal aortic aneurysm)   DMII (diabetes mellitus, type 2) (HCC)   Hypoxia   Early or partial SBO -No definite transition point on CT -General surgery consulted -NG tube placed -Keep n.p.o., bowel rest -IV fluid hydration -Antiemetic as needed -Serial abdominal exams  Mild hypoxia He did not receive any IV opiates in the ED.  Oxygen saturation in the upper 80s on room air, improved with 2 L  supplemental oxygen.  Patient is on home oxygen.  No wheezing or bronchospasm.  Chest x-ray showing right basilar atelectasis without pulmonary infiltrate, pleural effusion, or pneumothorax.  PE less likely given no tachycardia. -Continue supplemental oxygen to keep oxygen saturation above 92%.  Incentive spirometry.  Diet controlled type 2 diabetes Blood glucose 209.  A1c 6.6 in April 2022. -Repeat A1c.  Very sensitive sliding scale insulin every 4 hours  Hypertension Stable. -IV labetalol prn SBP >170  AAA -Outpatient vascular surgery follow-up  CKD stage IIIb-IV Creatinine 1.8, stable.  GERD -IV PPI  DVT prophylaxis: SCDs Code Status: Partial code (no intubation or mechanical ventilation) Family Communication: No family available at this time. Disposition Plan: Status is: Inpatient  Remains inpatient appropriate because: Has NG tube for SBO  Level of care: Level of care: Med-Surg  The medical decision making on this patient was of high complexity and the patient is at high risk for clinical deterioration, therefore this is a level 3 visit.  Shela Leff MD [facility]  If 7PM-7AM, please contact night-coverage [email or web address]  05/28/2021, 2:31 AM

## 2021-05-27 NOTE — ED Triage Notes (Signed)
2 days N/V/D  vomiting several times a day as well as diarrhea

## 2021-05-27 NOTE — Consult Note (Incomplete)
Reason for Consult:p SBO Referring Physician:  [name] is an 81 y.o. male.  HPI:  Pt is an 81 yo M who presented to the [facility] ED AM 05/27/21 with 2 days of vomiting and loose stools for 2 days.  He has had a prior SBO with similar symptoms.  He has had a VP shunt and prostate cancer seeds, but no other abdominal surgery.    He was admitted 12/21 to 12/23 for left ureteral nephrolithiasis.  He had a stent by Dr. Abner Greenspan at that time.    *** sick contacts?   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   S/P radiation therapy 04/23/13 - 05/29/14   Prostate/seminal vesicles, external beam 4500 cGy in 25 sessions   Seasonal allergies    Skin cancer    scalp   Wears glasses     Past Surgical History:  Procedure Laterality Date   BACK SURGERY  2009   COLONOSCOPY W/ BIOPSIES AND POLYPECTOMY     CYSTOSCOPY W/ URETERAL STENT PLACEMENT Left 05/12/2021   Procedure: CYSTOSCOPY WITH RETROGRADE PYELOGRAM/ LEFT URETERAL STENT PLACEMENT.;  Surgeon: [name], MD;  Location: [facility];  Service: Urology;  Laterality: Left;   [place]   LEFT HEART CATH AND CORONARY ANGIOGRAPHY N/A 03/14/2019   Procedure: LEFT HEART CATH AND CORONARY ANGIOGRAPHY;  Surgeon: [name], MD;  Location: [facility];  Service: Cardiovascular;  Laterality: N/A;   PROSTATE BIOPSY  2013, 2014, 01/2014   Gleason 7   RADIOACTIVE SEED IMPLANT N/A 07/01/2014   Procedure: RADIOACTIVE SEED IMPLANT;  Surgeon: [name], MD;  Location: [facility];  Service: Urology;  Laterality: N/A;  DR PORTABLE   TONSILLECTOMY      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   Procedure: SHUNT INSERTION VENTRICULAR-PERITONEAL;  Surgeon: [name], MD;  Location: [facility];  Service: Neurosurgery;  Laterality: N/A;  SHUNT INSERTION VENTRICULAR-PERITONEAL    Family History  Problem Relation Age of Onset   Heart attack Father 60   Cancer Father        prostate   Diabetes Father    Cancer Paternal Uncle        prostate   Dementia Mother 7   Lung cancer Brother     Social History:  reports that he quit smoking about 60 years ago. His smoking use included cigarettes. He has never used smokeless tobacco. He reports current alcohol use of about 1.0 standard drink per week. He reports that he does not use drugs.  Allergies:  Allergies  Allergen Reactions   Lipitor [Atorvastatin] Other (See Comments)    Hip pain    Medications:  Current Meds  Medication Sig   acetaminophen (TYLENOL) 500 MG tablet Take 1 tablet (500 mg total) by mouth every 6 (six) hours as needed for mild pain or moderate pain.   aspirin EC 81 MG tablet Take 81 mg by mouth daily.    carvedilol (COREG) 3.125 MG tablet Take 1 tablet (3.125 mg total) by mouth 2 (  two) times daily with a meal.   cholecalciferol (VITAMIN D3) 25 MCG (1000 UNIT) tablet Take 1,000 Units by mouth daily.   ezetimibe (ZETIA) 10 MG tablet Take 10 mg by mouth daily.   oxybutynin (DITROPAN XL) 10 MG 24 hr tablet Take 1 tablet (10 mg total) by mouth daily for 60 doses.   pantoprazole (PROTONIX) 20 MG tablet Take 20 mg by mouth daily before breakfast.    tamsulosin (FLOMAX) 0.4 MG CAPS capsule Take 1 capsule (0.4 mg total) by mouth daily after supper.   vitamin B-12 (CYANOCOBALAMIN) 250 MCG tablet Take 250 mcg by mouth daily.     Results for orders placed or performed during the hospital encounter of 05/27/21 (from the past 48 hour(s))  Lipase, blood     Status: Abnormal   Collection Time: 05/27/21 11:46 AM  Result Value Ref Range   Lipase 10 (L) 11 - 51 U/L    Comment:  Performed at [facility], [place], [place] [address]  Comprehensive metabolic panel     Status: Abnormal   Collection Time: 05/27/21 11:46 AM  Result Value Ref Range   Sodium 145 135 - 145 mmol/L   Potassium 3.9 3.5 - 5.1 mmol/L   Chloride 100 98 - 111 mmol/L   CO2 32 22 - 32 mmol/L   Glucose, Bld 209 (H) 70 - 99 mg/dL    Comment: Glucose reference range applies only to samples taken after fasting for at least 8 hours.   BUN 37 (H) 8 - 23 mg/dL   Creatinine, Ser 1.80 (H) 0.61 - 1.24 mg/dL   Calcium 9.4 8.9 - 10.3 mg/dL   Total Protein 7.3 6.5 - 8.1 g/dL   Albumin 4.2 3.5 - 5.0 g/dL   AST 24 15 - 41 U/L   ALT 18 0 - 44 U/L   Alkaline Phosphatase 64 38 - 126 U/L   Total Bilirubin 1.1 0.3 - 1.2 mg/dL   GFR, Estimated 37 (L) >60 mL/min    Comment: (NOTE) Calculated using the CKD-EPI Creatinine Equation (2021)    Anion gap 13 5 - 15    Comment: Performed at [facility], [address] [place], [place] [address]  CBC     Status: None   Collection Time: 05/27/21 11:46 AM  Result Value Ref Range   WBC 9.2 4.0 - 10.5 K/uL   RBC 5.25 4.22 - 5.81 MIL/uL   Hemoglobin 16.3 13.0 - 17.0 g/dL   HCT 50.9 39.0 - 52.0 %   MCV 97.0 80.0 - 100.0 fL   MCH 31.0 26.0 - 34.0 pg   MCHC 32.0 30.0 - 36.0 g/dL   RDW 13.9 11.5 - 15.5 %   Platelets 211 150 - 400 K/uL   nRBC 0.0 0.0 - 0.2 %    Comment: Performed at [facility], [address], [place], [place] [address]    CT ABDOMEN PELVIS WO CONTRAST  Result Date: 05/27/2021 CLINICAL DATA:  Bowel obstruction suspected. 2 days N/V/D vomiting several times a day as well as diarrhea. Pt drank 60 minutes prior EXAM: CT ABDOMEN AND PELVIS WITHOUT CONTRAST TECHNIQUE: Multidetector CT imaging of the abdomen and pelvis was performed following the standard protocol without IV contrast. COMPARISON:  CT renal 05/11/2021 FINDINGS: Lower chest: Elevated right hemidiaphragm with  atelectasis of the right base. Scarring versus atelectasis of bilateral lower lobes. Trace right pleural effusion with a ventricular shunt terminating within the right pleural space. Left anterior descending coronary calcification.  Hepatobiliary: No focal liver abnormality. Calcified gallstone noted within the gallbladder lumen. No gallbladder wall thickening or pericholecystic fluid. No biliary dilatation. Pancreas: Diffusely atrophic. No focal lesion. Otherwise normal pancreatic contour. No surrounding inflammatory changes. No main pancreatic ductal dilatation. Spleen: Normal in size without focal abnormality. Adrenals/Urinary Tract: No adrenal nodule bilaterally. Multiple calcified stones within the right kidney measuring up to 3 mm. No left nephrolithiasis. A left ureteral stent is noted with proximal pigtail terminating in the region of a decompressed left renal pelvis and proximal pigtail terminating within the urinary bladder lumen.No definite contour-deforming renal mass. No ureterolithiasis or hydroureter. The urinary bladder is unremarkable. Stomach/Bowel: Stomach is within normal limits. The proximal and early mid small bowel are dilated with air and fluid measuring up to 4 cm in caliber. No definite transition point. The mid to distal small bowel is however decompressed. No evidence of large bowel wall thickening or dilatation. Diffuse sigmoid and otherwise scattered colonic diverticulosis. The appendix is not definitely identified with no inflammatory changes in the right lower quadrant to suggest acute appendicitis. 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 This extends approximately 3-4 cm in the craniocaudal dimension. Mild atherosclerotic plaque of the aorta and its branches. No abdominal, pelvic, or inguinal lymphadenopathy. Reproductive: Prostate radiation beads noted. Other: No intraperitoneal free fluid. No  intraperitoneal free gas. No organized fluid collection. Musculoskeletal: No abdominal wall hernia or abnormality. No suspicious lytic or blastic osseous lesions. No acute displaced fracture. Multilevel degenerative changes of the spine. Stable grade 1 L4 on L5 anterolisthesis in a patient status post posterolateral fusion interbody fusion at this level. IMPRESSION: 1. Findings suggestive an early or partial small bowel obstruction. No definite transition point. Differential diagnosis includes ileus. No bowel perforation. 2. Colonic diverticulosis with no acute diverticulitis. 3. Nonobstructive right nephrolithiasis measuring up to 3 mm. 4. Left ureteral stent in a decompressed left collecting system in likely appropriate position. Limited evaluation on this noncontrast study. 5. Cholelithiasis with no acute cholecystitis. 6. 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 Recommend annual imaging followup by CTA or MRA. This recommendation follows 2010 ACCF/AHA/AATS/ACR/ASA/SCA/SCAI/SIR/STS/SVM Guidelines for the Diagnosis and Management of Patients with Thoracic Aortic Disease. Circulation. 2010; 121: F749-S496. 7. Aortic aneurysm NOS (ICD10-I71.9). Aortic Atherosclerosis (ICD10-I70.0). Electronically Signed   By: Iven Finn M.D.   On: 05/27/2021 16:13   DG Chest Portable 1 View  Result Date: 05/27/2021 CLINICAL DATA:  Nasogastric tube placement EXAM: PORTABLE CHEST 1 VIEW COMPARISON:  Portable exam 1807 hours compared to 05/12/2021 FINDINGS: Nasogastric tube tip projects over proximal stomach. VP shunt tubing traverses RIGHT hemithorax. Bowel interposition between liver and diaphragm. Enlargement of cardiac silhouette. Mediastinal contours and pulmonary vascularity normal. Atherosclerotic calcification aorta. RIGHT basilar atelectasis without pulmonary infiltrate, pleural effusion, or pneumothorax. IMPRESSION: Nasogastric tube projects over proximal stomach. Enlargement of cardiac  silhouette with RIGHT basilar atelectasis. Aortic Atherosclerosis (ICD10-I70.0). Electronically Signed   By: Lavonia Dana M.D.   On: 05/27/2021 18:18    Review of Systems ***  Blood pressure 129/87, pulse 93, temperature 98.6 F (37 C), temperature source Oral, resp. rate 18, height 5\' 10"  (1.778 m), weight 86.2 kg, SpO2 95 %.  Physical Exam ***  Assessment/Plan:  Nausea, vomiting, diarrhea Abnormal CT abd with concerns for SBO vs ileus  Agree with supportive care including NGT, IVF, symptoms control with nausea/pain. No acute surgical intervention needed.  ***  Stark Klein 05/27/2021, 6:50 PM

## 2021-05-27 NOTE — ED Notes (Signed)
Patient placed on 2l Alpha

## 2021-05-27 NOTE — ED Provider Notes (Signed)
[facility] EMERGENCY DEPT Provider Note   CSN: [id number] Arrival date & time: 05/27/21  1100     History Chief Complaint  Patient presents with   Emesis   Nausea   Diarrhea    Jeremiah [name] is a 81 y.o. male presents the emergency department for evaluation of loose stool and vomiting over the past 2 days.  He denies any chest pain, shortness of breath, hematemesis, hematochezia, coffee-ground emesis, or abdominal pain.  He denies any fevers, new foods, or recent travel.  Daughter is accompanying him bedside and reports that he did not have any abdominal pain with his last small bowel obstruction, but had the same symptoms.  Recent kidney stone with ureter stent still in place.  Recent antibiotics in late December 2022.  Medical history includes prostate cancer in remission, BPH, diabetes, renal stones.  Surgical history of VP shunt, left heart cath, cystoscopy, back surgery, and prostate biopsy.  Allergic to Lipitor.  Denies any EtOH or drug use.  Former smoker.   Emesis Associated symptoms: diarrhea   Associated symptoms: no abdominal pain, no arthralgias, no chills, no cough, no fever and no sore throat   Diarrhea Associated symptoms: vomiting   Associated symptoms: no abdominal pain, no arthralgias, no chills and no fever       Home Medications Prior to Admission medications   Medication Sig Start Date End Date Taking? Authorizing Provider  acetaminophen (TYLENOL) 500 MG tablet Take 1 tablet (500 mg total) by mouth every 6 (six) hours as needed for mild pain or moderate pain. 05/13/21  Yes Thurnell Lose, MD  aspirin EC 81 MG tablet Take 81 mg by mouth daily.    Yes [provider]  carvedilol (COREG) 3.125 MG tablet Take 1 tablet (3.125 mg total) by mouth 2 (two) times daily with a meal. 05/13/21  Yes Thurnell Lose, MD  cholecalciferol (VITAMIN D3) 25 MCG (1000 UNIT) tablet Take 1,000 Units by mouth daily.   Yes [provider]   ezetimibe (ZETIA) 10 MG tablet Take 10 mg by mouth daily. 05/02/21  Yes [provider]  oxybutynin (DITROPAN XL) 10 MG 24 hr tablet Take 1 tablet (10 mg total) by mouth daily for 60 doses. 05/13/21 07/12/21 Yes [name], MD  pantoprazole (PROTONIX) 20 MG tablet Take 20 mg by mouth daily before breakfast.  02/19/19  Yes [provider]  tamsulosin (FLOMAX) 0.4 MG CAPS capsule Take 1 capsule (0.4 mg total) by mouth daily after supper. 05/11/21  Yes Deno Etienne, DO  vitamin B-12 (CYANOCOBALAMIN) 250 MCG tablet Take 250 mcg by mouth daily.   Yes [provider]  cephALEXin (KEFLEX) 500 MG capsule Take 1 capsule (500 mg total) by mouth 3 (three) times daily. Patient not taking: Reported on 05/27/2021 05/13/21   Thurnell Lose, MD  ondansetron (ZOFRAN-ODT) 4 MG disintegrating tablet 4mg  ODT q4 hours prn nausea/vomit Patient not taking: Reported on 05/27/2021 05/11/21   Deno Etienne, DO  oxyCODONE (OXY IR/ROXICODONE) 5 MG immediate release tablet Take 1 tablet (5 mg total) by mouth every 8 (eight) hours as needed for severe pain or breakthrough pain. Patient not taking: Reported on 05/27/2021 05/13/21   Thurnell Lose, MD      Allergies    Lipitor [atorvastatin]    Review of Systems   Review of Systems  Constitutional:  Negative for chills and fever.  HENT:  Negative for ear pain and sore throat.   Eyes:  Negative for pain  and visual disturbance.  Respiratory:  Negative for cough and shortness of breath.   Cardiovascular:  Negative for chest pain and palpitations.  Gastrointestinal:  Positive for diarrhea, nausea and vomiting. Negative for abdominal pain, blood in stool and constipation.  Genitourinary:  Negative for dysuria, hematuria and urgency.  Musculoskeletal:  Negative for arthralgias and back pain.  Skin:  Negative for color change and rash.  Neurological:  Negative for seizures and syncope.  All other systems reviewed and are negative.  Physical  Exam Updated Vital Signs BP (!) 142/122    Pulse 88    Temp 98.7 F (37.1 C)    Resp 18    Ht 5\' 10"  (1.778 m)    Wt 86.2 kg    SpO2 92%    BMI 27.26 kg/m  Physical Exam Vitals and nursing note reviewed.  Constitutional:      Appearance: He is not toxic-appearing.     Comments: Uncomfortable  HENT:     Head: Normocephalic and atraumatic.     Mouth/Throat:     Mouth: Mucous membranes are dry.  Eyes:     General: No scleral icterus. Cardiovascular:     Rate and Rhythm: Normal rate and regular rhythm.  Pulmonary:     Effort: Pulmonary effort is normal.     Breath sounds: Normal breath sounds.  Abdominal:     General: There is distension.     Palpations: Abdomen is soft.     Tenderness: There is no abdominal tenderness. There is no guarding or rebound.     Comments: Hyperactive bowel sounds in the lower left and right quadrant.  Normoactive bowel sounds in upper abdomen.  Abdominal distention present, but nonsurgical, not rigid..  Musculoskeletal:        General: No deformity.     Cervical back: Normal range of motion.  Skin:    General: Skin is warm and dry.  Neurological:     General: No focal deficit present.     Mental Status: He is alert. Mental status is at baseline.    ED Results / Procedures / Treatments   Labs (all labs ordered are listed, but only abnormal results are displayed) Labs Reviewed  LIPASE, BLOOD - Abnormal; Notable for the following components:      Result Value   Lipase 10 (*)    All other components within normal limits  COMPREHENSIVE METABOLIC PANEL - Abnormal; Notable for the following components:   Glucose, Bld 209 (*)    BUN 37 (*)    Creatinine, Ser 1.80 (*)    GFR, Estimated 37 (*)    All other components within normal limits  CBC  URINALYSIS, ROUTINE W REFLEX MICROSCOPIC    EKG None  Radiology CT ABDOMEN PELVIS WO CONTRAST  Result Date: 05/27/2021 CLINICAL DATA:  Bowel obstruction suspected. 2 days N/V/D vomiting several times a  day as well as diarrhea. Pt drank 60 minutes prior EXAM: CT ABDOMEN AND PELVIS WITHOUT CONTRAST TECHNIQUE: Multidetector CT imaging of the abdomen and pelvis was performed following the standard protocol without IV contrast. COMPARISON:  CT renal 05/11/2021 FINDINGS: Lower chest: Elevated right hemidiaphragm with atelectasis of the right base. Scarring versus atelectasis of bilateral lower lobes. Trace right pleural effusion with a ventricular shunt terminating within the right pleural space. Left anterior descending coronary calcification. Hepatobiliary: No focal liver abnormality. Calcified gallstone noted within the gallbladder lumen. No gallbladder wall thickening or pericholecystic fluid. No biliary dilatation. Pancreas: Diffusely atrophic. No focal lesion. Otherwise normal  pancreatic contour. No surrounding inflammatory changes. No main pancreatic ductal dilatation. Spleen: Normal in size without focal abnormality. Adrenals/Urinary Tract: No adrenal nodule bilaterally. Multiple calcified stones within the right kidney measuring up to 3 mm. No left nephrolithiasis. A left ureteral stent is noted with proximal pigtail terminating in the region of a decompressed left renal pelvis and proximal pigtail terminating within the urinary bladder lumen.No definite contour-deforming renal mass. No ureterolithiasis or hydroureter. The urinary bladder is unremarkable. Stomach/Bowel: Stomach is within normal limits. The proximal and early mid small bowel are dilated with air and fluid measuring up to 4 cm in caliber. No definite transition point. The mid to distal small bowel is however decompressed. No evidence of large bowel wall thickening or dilatation. Diffuse sigmoid and otherwise scattered colonic diverticulosis. The appendix is not definitely identified with no inflammatory changes in the right lower quadrant to suggest acute appendicitis. Vascular/Lymphatic: Stable in size infrarenal abdominal aorta aneurysm appears  stable in caliber measuring 4.3 x 3.5 cm. This extends approximately 3-4 cm in the craniocaudal dimension. Mild atherosclerotic plaque of the aorta and its branches. No abdominal, pelvic, or inguinal lymphadenopathy. Reproductive: Prostate radiation beads noted. Other: No intraperitoneal free fluid. No intraperitoneal free gas. No organized fluid collection. Musculoskeletal: No abdominal wall hernia or abnormality. No suspicious lytic or blastic osseous lesions. No acute displaced fracture. Multilevel degenerative changes of the spine. Stable grade 1 L4 on L5 anterolisthesis in a patient status post posterolateral fusion interbody fusion at this level. IMPRESSION: 1. Findings suggestive an early or partial small bowel obstruction. No definite transition point. Differential diagnosis includes ileus. No bowel perforation. 2. Colonic diverticulosis with no acute diverticulitis. 3. Nonobstructive right nephrolithiasis measuring up to 3 mm. 4. Left ureteral stent in a decompressed left collecting system in likely appropriate position. Limited evaluation on this noncontrast study. 5. Cholelithiasis with no acute cholecystitis. 6. 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 Recommend annual imaging followup by CTA or MRA. This recommendation follows 2010 ACCF/AHA/AATS/ACR/ASA/SCA/SCAI/SIR/STS/SVM Guidelines for the Diagnosis and Management of Patients with Thoracic Aortic Disease. Circulation. 2010; 121: L244-W102. 7. Aortic aneurysm NOS (ICD10-I71.9). Aortic Atherosclerosis (ICD10-I70.0). Electronically Signed   By: Iven Finn M.D.   On: 05/27/2021 16:13   DG Chest Portable 1 View  Result Date: 05/27/2021 CLINICAL DATA:  Nasogastric tube placement EXAM: PORTABLE CHEST 1 VIEW COMPARISON:  Portable exam 1807 hours compared to 05/12/2021 FINDINGS: Nasogastric tube tip projects over proximal stomach. VP shunt tubing traverses RIGHT hemithorax. Bowel interposition between liver and diaphragm.  Enlargement of cardiac silhouette. Mediastinal contours and pulmonary vascularity normal. Atherosclerotic calcification aorta. RIGHT basilar atelectasis without pulmonary infiltrate, pleural effusion, or pneumothorax. IMPRESSION: Nasogastric tube projects over proximal stomach. Enlargement of cardiac silhouette with RIGHT basilar atelectasis. Aortic Atherosclerosis (ICD10-I70.0). Electronically Signed   By: Lavonia Dana M.D.   On: 05/27/2021 18:18    Procedures Procedures  Mildly hypoxic around 88%, however patient is speaking in full sentences and is unclear of any shortness of breath.  Medications Ordered in ED Medications  ondansetron (ZOFRAN) injection 4 mg (4 mg Intravenous Given 05/27/21 1400)  lactated ringers bolus 500 mL (0 mLs Intravenous Stopped 05/27/21 1434)  ondansetron (ZOFRAN) injection 4 mg (4 mg Intravenous Given 05/27/21 1659)    ED Course/ Medical Decision Making/ A&P                           Medical Decision Making  81 year old male presents to  the emergency department for evaluation of diarrhea, nausea, vomiting for the past 2 days.  Denies any abdominal pain.  Differential diagnosis includes but is not limited to diverticulitis, colitis, viral gastroenteritis, COVID, flu, SBO, ileus.  Vital signs are show some initial hypoxia, the patient speaking in full sentence with ease without any shortness of breath.  Patient was recently put on at home.  2 L of oxygen after discharge from is ureter stent placement.  Placed him on his at home 2 L and the patient is now satting 96%.  For physical exam, the patient does have a distended abdomen however is not rigid or surgical.  No tenderness to palpation.  No guarding or rebound.  He has hyperactive bowel sounds in the lower abdomen with normal bowel sounds in the upper.  His mouth has dry mucous membranes.  Normal lung sounds.  Labs and imaging ordered.  CMP shows elevated glucose at 209 with a creatinine of 1.8 provement patient's  creatinine of 2.12 weeks ago.  Lipase slightly decreased at 10.  CBC shows no signs of leukocytosis or anemia.  Urinalysis pending.  COVID and flu pending.  CT abdomen shows early or partial small bowel obstruction with no definite transition point.  Could also be ileus patient's recent surgery.  With the patient's CT scan showing small bowel obstruction, will admit to medicine.  NG tube placed by nursing staff and confirmed on chest x-ray for placement.  1 L of gastric contents removed.  Admission accepted by Dr. Verlon Au who recommended consulting general surgery.  Spoke with general surgery, Dr. Romana Juniper, who recommended maintenance fluids at 75 mL/h.  Discussed plan with patient and family member who are amenable to plan.  Final Clinical Impression(s) / ED Diagnoses Final diagnoses:  Small bowel obstruction [facility])    Rx / DC Orders ED Discharge Orders     None         Sherrell Puller, Hershal Coria 05/27/21 1917    Davonna Belling, MD 05/28/21 [phone]

## 2021-05-28 ENCOUNTER — Inpatient Hospital Stay (HOSPITAL_COMMUNITY): Payer: Medicare Other

## 2021-05-28 DIAGNOSIS — R0902 Hypoxemia: Secondary | ICD-10-CM

## 2021-05-28 LAB — CBC WITH DIFFERENTIAL/PLATELET
Abs Immature Granulocytes: 0.03 10*3/uL (ref 0.00–0.07)
Basophils Absolute: 0 10*3/uL (ref 0.0–0.1)
Basophils Relative: 0 %
Eosinophils Absolute: 0.1 10*3/uL (ref 0.0–0.5)
Eosinophils Relative: 1 %
HCT: 46 % (ref 39.0–52.0)
Hemoglobin: 14.3 g/dL (ref 13.0–17.0)
Immature Granulocytes: 1 %
Lymphocytes Relative: 13 %
Lymphs Abs: 0.8 10*3/uL (ref 0.7–4.0)
MCH: 31.3 pg (ref 26.0–34.0)
MCHC: 31.1 g/dL (ref 30.0–36.0)
MCV: 100.7 fL — ABNORMAL HIGH (ref 80.0–100.0)
Monocytes Absolute: 0.8 10*3/uL (ref 0.1–1.0)
Monocytes Relative: 13 %
Neutro Abs: 4.7 10*3/uL (ref 1.7–7.7)
Neutrophils Relative %: 72 %
Platelets: 167 10*3/uL (ref 150–400)
RBC: 4.57 MIL/uL (ref 4.22–5.81)
RDW: 14 % (ref 11.5–15.5)
WBC: 6.4 10*3/uL (ref 4.0–10.5)
nRBC: 0 % (ref 0.0–0.2)

## 2021-05-28 LAB — GLUCOSE, CAPILLARY
Glucose-Capillary: 139 mg/dL — ABNORMAL HIGH (ref 70–99)
Glucose-Capillary: 141 mg/dL — ABNORMAL HIGH (ref 70–99)
Glucose-Capillary: 142 mg/dL — ABNORMAL HIGH (ref 70–99)
Glucose-Capillary: 163 mg/dL — ABNORMAL HIGH (ref 70–99)
Glucose-Capillary: 166 mg/dL — ABNORMAL HIGH (ref 70–99)
Glucose-Capillary: 172 mg/dL — ABNORMAL HIGH (ref 70–99)

## 2021-05-28 LAB — URINALYSIS, ROUTINE W REFLEX MICROSCOPIC
Bilirubin Urine: NEGATIVE
Glucose, UA: NEGATIVE mg/dL
Ketones, ur: NEGATIVE mg/dL
Leukocytes,Ua: NEGATIVE
Nitrite: NEGATIVE
Protein, ur: 100 mg/dL — AB
RBC / HPF: >50 RBC/hpf — ABNORMAL HIGH (ref 0–5)
Specific Gravity, Urine: 1.023 (ref 1.005–1.030)
pH: 5 (ref 5.0–8.0)

## 2021-05-28 LAB — COMPREHENSIVE METABOLIC PANEL
ALT: 19 U/L (ref 0–44)
AST: 20 U/L (ref 15–41)
Albumin: 3.3 g/dL — ABNORMAL LOW (ref 3.5–5.0)
Alkaline Phosphatase: 49 U/L (ref 38–126)
Anion gap: 7 (ref 5–15)
BUN: 45 mg/dL — ABNORMAL HIGH (ref 8–23)
CO2: 35 mmol/L — ABNORMAL HIGH (ref 22–32)
Calcium: 8.2 mg/dL — ABNORMAL LOW (ref 8.9–10.3)
Chloride: 100 mmol/L (ref 98–111)
Creatinine, Ser: 1.97 mg/dL — ABNORMAL HIGH (ref 0.61–1.24)
GFR, Estimated: 34 mL/min — ABNORMAL LOW (ref >60)
Glucose, Bld: 139 mg/dL — ABNORMAL HIGH (ref 70–99)
Potassium: 3.2 mmol/L — ABNORMAL LOW (ref 3.5–5.1)
Sodium: 142 mmol/L (ref 135–145)
Total Bilirubin: 1.2 mg/dL (ref 0.3–1.2)
Total Protein: 6 g/dL — ABNORMAL LOW (ref 6.5–8.1)

## 2021-05-28 LAB — HEMOGLOBIN A1C
Hgb A1c MFr Bld: 7.2 % — ABNORMAL HIGH (ref 4.8–5.6)
Mean Plasma Glucose: 159.94 mg/dL

## 2021-05-28 LAB — MAGNESIUM: Magnesium: 2 mg/dL (ref 1.7–2.4)

## 2021-05-28 MED ORDER — POTASSIUM CHLORIDE 10 MEQ/100ML IV SOLN
10.0000 meq | INTRAVENOUS | Status: AC
  Administered 2021-05-28 (×2): 10 meq via INTRAVENOUS
  Filled 2021-05-28 (×2): qty 100

## 2021-05-28 MED ORDER — MORPHINE SULFATE (PF) 4 MG/ML IV SOLN: 4.0000 mg | INTRAVENOUS | Status: DC | PRN

## 2021-05-28 MED ORDER — INSULIN ASPART 100 UNIT/ML IJ SOLN
0.0000 [IU] | INTRAMUSCULAR | Status: DC
  Administered 2021-05-28 – 2021-05-30 (×7): 1 [IU] via SUBCUTANEOUS

## 2021-05-28 MED ORDER — LABETALOL HCL 5 MG/ML IV SOLN
5.0000 mg | INTRAVENOUS | Status: DC | PRN
  Filled 2021-05-28: qty 4

## 2021-05-28 MED ORDER — PANTOPRAZOLE SODIUM 40 MG IV SOLR
20.0000 mg | INTRAVENOUS | Status: DC
  Administered 2021-05-29 – 2021-05-30 (×2): 20 mg via INTRAVENOUS
  Filled 2021-05-28 (×3): qty 40

## 2021-05-28 MED ORDER — METOCLOPRAMIDE HCL 5 MG/ML IJ SOLN
5.0000 mg | Freq: Four times a day (QID) | INTRAMUSCULAR | Status: DC
  Administered 2021-05-28 – 2021-05-30 (×7): 5 mg via INTRAVENOUS
  Filled 2021-05-28 (×7): qty 2

## 2021-05-28 MED ORDER — ONDANSETRON HCL 4 MG/2ML IJ SOLN: 4.0000 mg | Freq: Four times a day (QID) | INTRAMUSCULAR | Status: DC | PRN

## 2021-05-28 MED ORDER — PHENOL 1.4 % MT LIQD
2.0000 | OROMUCOSAL | Status: DC | PRN
  Filled 2021-05-28: qty 177

## 2021-05-28 MED ORDER — PANTOPRAZOLE SODIUM 40 MG IV SOLR
40.0000 mg | Freq: Two times a day (BID) | INTRAVENOUS | Status: DC
  Administered 2021-05-28: 40 mg via INTRAVENOUS
  Filled 2021-05-28: qty 40

## 2021-05-28 MED ORDER — MORPHINE SULFATE (PF) 2 MG/ML IV SOLN: 2.0000 mg | INTRAVENOUS | Status: DC | PRN

## 2021-05-28 NOTE — Progress Notes (Signed)
PROGRESS NOTE    Jeremiah [name]  ION:[id number] DOB: 08/02/1940 DOA: 05/27/2021 PCP: [name], MD    Brief Narrative:  This 81 years old male with PMH significant of BPH, type 2 diabetes, hypertension, hyperlipidemia, history of VP shunt, OSA, GERD, AAA, prostate cancer s/p radiation therapy, BPH, CKD stage IIIb and IV presented in the ED with complaints of abdominal pain associated with nausea vomiting and loose stools for 2 days.  CT abdomen and pelvis showed findings suggestive of early or partial small bowel obstruction.  No definite transition point was noted. Patient is admitted for partial small bowel obstruction, general surgery consulted patient is kept n.p.o. started on NG tube decompression.  Assessment & Plan:   Principal Problem:   SBO (small bowel obstruction) (HCC) Active Problems:   HTN (hypertension)   AAA (abdominal aortic aneurysm)   DMII (diabetes mellitus, type 2) (HCC)   Hypoxia  Partial small bowel obstruction.: Patient presented with nausea,  vomiting and diarrhea. CT abdomen shows partial small bowel obstruction without definite transition point. General surgery consulted, Continue n.p.o., IV hydration,  adequate pain control Antiemetics as needed.  Clamp NG tube.  Type 2 diabetes: Continue regular insulin sliding scale  Essential hypertension: Blood pressure is stable, Continue IV labetalol as needed.  AAA: Needs outpatient vascular surgery follow-up.  GERD: IV Protonix  CKD stage IIIb-IV: Serum creatinine at baseline. Avoid nephrotoxic medications, Continue to monitor serum creatinine  DVT prophylaxis: SCDs Code Status: Partial code Family Communication: No family at bedside Disposition Plan:   Status is: Inpatient  Remains inpatient appropriate because: Admitted for small bowel obstruction.  Anticipated discharge home in few days.  Consultants:  General surgery  Procedures: CT abdomen pelvis Antimicrobials:  None  Subjective: Patient was seen and examined at bedside.  Overnight events noted.   Patient reports feeling much improved.  Patient reports able to pass flatus but has having diarrhea.  Objective: Vitals:   05/28/21 0122 05/28/21 0411 05/28/21 0954 05/28/21 1335  BP: (!) 133/93 116/69 106/72 113/75  Pulse: 91 85 85 87  Resp: 16 18 18 14   Temp: 97.8 F (36.6 C) 98.1 F (36.7 C) 98.4 F (36.9 C) 98.4 F (36.9 C)  TempSrc: Oral Oral Oral Oral  SpO2: 91% 92% 90% 93%  Weight:      Height:        Intake/Output Summary (Last 24 hours) at 05/28/2021 1438 Last data filed at 05/28/2021 1131 Gross per 24 hour  Intake 817.36 ml  Output 1075 ml  Net -257.64 ml   Filed Weights   05/27/21 1130 05/27/21 2121  Weight: 86.2 kg 85.2 kg    Examination:  General exam: Appears comfortable, not in any acute distress. Respiratory system: Clear to auscultation bilaterally. Respiratory effort normal. Cardiovascular system: S1-S2 heard, regular rate and rhythm, no murmur.   Gastrointestinal system: Abdomen is soft, Mildly tender, nondistended, BS + Central nervous system: Alert and oriented. No focal neurological deficits. Extremities: No edema, no cyanosis, no clubbing. Skin: No rashes, lesions or ulcers Psychiatry: Judgement and insight appear normal. Mood & affect appropriate.     Data Reviewed: I have personally reviewed following labs and imaging studies  CBC: Recent Labs  Lab 05/27/21 1146 05/28/21 0601  WBC 9.2 6.4  NEUTROABS  --  4.7  HGB 16.3 14.3  HCT 50.9 46.0  MCV 97.0 100.7*  PLT 211 244   Basic Metabolic Panel: Recent Labs  Lab 05/27/21 1146 05/28/21 0601  NA 145 142  K 3.9  3.2*  CL 100 100  CO2 32 35*  GLUCOSE 209* 139*  BUN 37* 45*  CREATININE 1.80* 1.97*  CALCIUM 9.4 8.2*  MG  --  2.0   GFR: Estimated Creatinine Clearance: 30.4 mL/min (A) (by C-G formula based on SCr of 1.97 mg/dL (H)). Liver Function Tests: Recent Labs  Lab 05/27/21 1146  05/28/21 0601  AST 24 20  ALT 18 19  ALKPHOS 64 49  BILITOT 1.1 1.2  PROT 7.3 6.0*  ALBUMIN 4.2 3.3*   Recent Labs  Lab 05/27/21 1146  LIPASE 10*   No results for input(s): AMMONIA in the last 168 hours. Coagulation Profile: No results for input(s): INR, PROTIME in the last 168 hours. Cardiac Enzymes: No results for input(s): CKTOTAL, CKMB, CKMBINDEX, TROPONINI in the last 168 hours. BNP (last 3 results) No results for input(s): PROBNP in the last 8760 hours. HbA1C: Recent Labs    05/28/21 0601  HGBA1C 7.2*   CBG: Recent Labs  Lab 05/28/21 0412 05/28/21 0801 05/28/21 1158  GLUCAP 163* 142* 172*   Lipid Profile: No results for input(s): CHOL, HDL, LDLCALC, TRIG, CHOLHDL, LDLDIRECT in the last 72 hours. Thyroid Function Tests: No results for input(s): TSH, T4TOTAL, FREET4, T3FREE, THYROIDAB in the last 72 hours. Anemia Panel: No results for input(s): VITAMINB12, FOLATE, FERRITIN, TIBC, IRON, RETICCTPCT in the last 72 hours. Sepsis Labs: No results for input(s): PROCALCITON, LATICACIDVEN in the last 168 hours.  Recent Results (from the past 240 hour(s))  Resp Panel by RT-PCR (Flu A&B, Covid) Nasopharyngeal Swab     Status: None   Collection Time: 05/27/21  7:13 PM   Specimen: Nasopharyngeal Swab; Nasopharyngeal(NP) swabs in vial transport medium  Result Value Ref Range Status   SARS Coronavirus 2 by RT PCR NEGATIVE NEGATIVE Final    Comment: (NOTE) SARS-CoV-2 target nucleic acids are NOT DETECTED.  The SARS-CoV-2 RNA is generally detectable in upper respiratory specimens during the acute phase of infection. The lowest concentration of SARS-CoV-2 viral copies this assay can detect is 138 copies/mL. A negative result does not preclude SARS-Cov-2 infection and should not be used as the sole basis for treatment or other patient management decisions. A negative result may occur with  improper specimen collection/handling, submission of specimen other than  nasopharyngeal swab, presence of viral mutation(s) within the areas targeted by this assay, and inadequate number of viral copies(<138 copies/mL). A negative result must be combined with clinical observations, patient history, and epidemiological information. The expected result is Negative.  Fact Sheet for Patients:  EntrepreneurPulse.com.au  Fact Sheet for Healthcare Providers:  IncredibleEmployment.be  This test is no t yet approved or cleared by the [place] FDA and  has been authorized for detection and/or diagnosis of SARS-CoV-2 by FDA under an Emergency Use Authorization (EUA). This EUA will remain  in effect (meaning this test can be used) for the duration of the COVID-19 declaration under Section 564(b)(1) of the Act, 21 U.S.C.section 360bbb-3(b)(1), unless the authorization is terminated  or revoked sooner.       Influenza A by PCR NEGATIVE NEGATIVE Final   Influenza B by PCR NEGATIVE NEGATIVE Final    Comment: (NOTE) The Xpert Xpress SARS-CoV-2/FLU/RSV plus assay is intended as an aid in the diagnosis of influenza from Nasopharyngeal swab specimens and should not be used as a sole basis for treatment. Nasal washings and aspirates are unacceptable for Xpert Xpress SARS-CoV-2/FLU/RSV testing.  Fact Sheet for Patients: EntrepreneurPulse.com.au  Fact Sheet for Healthcare Providers: IncredibleEmployment.be  This test  is not yet approved or cleared by the [place] and has been authorized for detection and/or diagnosis of SARS-CoV-2 by FDA under an Emergency Use Authorization (EUA). This EUA will remain in effect (meaning this test can be used) for the duration of the COVID-19 declaration under Section 564(b)(1) of the Act, 21 U.S.C. section 360bbb-3(b)(1), unless the authorization is terminated or revoked.  Performed at [facility], [address] [place], [place] [address]     Radiology Studies: CT ABDOMEN PELVIS WO CONTRAST  Result Date: 05/27/2021 CLINICAL DATA:  Bowel obstruction suspected. 2 days N/V/D vomiting several times a day as well as diarrhea. Pt drank 60 minutes prior EXAM: CT ABDOMEN AND PELVIS WITHOUT CONTRAST TECHNIQUE: Multidetector CT imaging of the abdomen and pelvis was performed following the standard protocol without IV contrast. COMPARISON:  CT renal 05/11/2021 FINDINGS: Lower chest: Elevated right hemidiaphragm with atelectasis of the right base. Scarring versus atelectasis of bilateral lower lobes. Trace right pleural effusion with a ventricular shunt terminating within the right pleural space. Left anterior descending coronary calcification. Hepatobiliary: No focal liver abnormality. Calcified gallstone noted within the gallbladder lumen. No gallbladder wall thickening or pericholecystic fluid. No biliary dilatation. Pancreas: Diffusely atrophic. No focal lesion. Otherwise normal pancreatic contour. No surrounding inflammatory changes. No main pancreatic ductal dilatation. Spleen: Normal in size without focal abnormality. Adrenals/Urinary Tract: No adrenal nodule bilaterally. Multiple calcified stones within the right kidney measuring up to 3 mm. No left nephrolithiasis. A left ureteral stent is noted with proximal pigtail terminating in the region of a decompressed left renal pelvis and proximal pigtail terminating within the urinary bladder lumen.No definite contour-deforming renal mass. No ureterolithiasis or hydroureter. The urinary bladder is unremarkable. Stomach/Bowel: Stomach is within normal limits. The proximal and early mid small bowel are dilated with air and fluid measuring up to 4 cm in caliber. No definite transition point. The mid to distal small bowel is however decompressed. No evidence of large bowel wall thickening or dilatation. Diffuse sigmoid and otherwise scattered colonic diverticulosis. The  appendix is not definitely identified with no inflammatory changes in the right lower quadrant to suggest acute appendicitis. 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 This extends approximately 3-4 cm in the craniocaudal dimension. Mild atherosclerotic plaque of the aorta and its branches. No abdominal, pelvic, or inguinal lymphadenopathy. Reproductive: Prostate radiation beads noted. Other: No intraperitoneal free fluid. No intraperitoneal free gas. No organized fluid collection. Musculoskeletal: No abdominal wall hernia or abnormality. No suspicious lytic or blastic osseous lesions. No acute displaced fracture. Multilevel degenerative changes of the spine. Stable grade 1 L4 on L5 anterolisthesis in a patient status post posterolateral fusion interbody fusion at this level. IMPRESSION: 1. Findings suggestive an early or partial small bowel obstruction. No definite transition point. Differential diagnosis includes ileus. No bowel perforation. 2. Colonic diverticulosis with no acute diverticulitis. 3. Nonobstructive right nephrolithiasis measuring up to 3 mm. 4. Left ureteral stent in a decompressed left collecting system in likely appropriate position. Limited evaluation on this noncontrast study. 5. Cholelithiasis with no acute cholecystitis. 6. 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 Recommend annual imaging followup by CTA or MRA. This recommendation follows 2010 ACCF/AHA/AATS/ACR/ASA/SCA/SCAI/SIR/STS/SVM Guidelines for the Diagnosis and Management of Patients with Thoracic Aortic Disease. Circulation. 2010; 121: L893-T342. 7. Aortic aneurysm NOS (ICD10-I71.9). Aortic Atherosclerosis (ICD10-I70.0). Electronically Signed   By: Iven Finn M.D.   On: 05/27/2021 16:13  DG Abd 1 View  Result Date: 05/28/2021 CLINICAL DATA:  Small bowel obstruction. EXAM: ABDOMEN - 1 VIEW COMPARISON:  CT examination dated  May 27, 2021 FINDINGS: NG tube projecting over the gastric body. Right-sided drain is again noted. Multiple dilated bowel loops measuring up to 4.9 cm concerning for ileus/obstruction. Left ureteral stent is unchanged. Posterior spinal fusion at L5-S1. No acute osseous abnormality. IMPRESSION: Multiple dilated small bowel loops measuring up to 4.9 cm concerning for bowel obstruction/ileus. Electronically Signed   By: Keane Police D.O.   On: 05/28/2021 09:04   DG Chest Portable 1 View  Result Date: 05/27/2021 CLINICAL DATA:  Nasogastric tube placement EXAM: PORTABLE CHEST 1 VIEW COMPARISON:  Portable exam 1807 hours compared to 05/12/2021 FINDINGS: Nasogastric tube tip projects over proximal stomach. VP shunt tubing traverses RIGHT hemithorax. Bowel interposition between liver and diaphragm. Enlargement of cardiac silhouette. Mediastinal contours and pulmonary vascularity normal. Atherosclerotic calcification aorta. RIGHT basilar atelectasis without pulmonary infiltrate, pleural effusion, or pneumothorax. IMPRESSION: Nasogastric tube projects over proximal stomach. Enlargement of cardiac silhouette with RIGHT basilar atelectasis. Aortic Atherosclerosis (ICD10-I70.0). Electronically Signed   By: Lavonia Dana M.D.   On: 05/27/2021 18:18    Scheduled Meds:  insulin aspart  0-6 Units Subcutaneous Q4H   metoCLOPramide (REGLAN) injection  5 mg Intravenous Q6H   [START ON 05/29/2021] pantoprazole (PROTONIX) IV  20 mg Intravenous Q24H   Continuous Infusions:  lactated ringers Stopped (05/27/21 2032)     LOS: 1 day    Time spent: [place], MD [facility]   If 7PM-7AM, please contact night-coverage

## 2021-05-28 NOTE — Consult Note (Signed)
Reason for Consult:Partial SBO Referring Physician: Kazuo Durnil is an 81 y.o. male.  HPI: Pt is an 81 yo M who presented to the [facility] ED AM 05/27/21 with 2 days of vomiting and loose stools.  He has had a prior SBO with similar symptoms.  He has had a VP shunt and prostate cancer seeds, but no other abdominal surgery.     He was admitted 12/21 to 05/13/21 for left ureteral nephrolithiasis.  He had a stent by Dr. Abner Greenspan at that time.    He has had a previous similar episode that resolved without surgery.  Overnight, he had  NG placed with about 1000 cc output, but minimal output since that time.  He has had two bowel movements since admission.  He denies any abdominal pain, bloating, nausea, or vomiting.  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   S/P radiation therapy 04/23/13 - 05/29/14   Prostate/seminal vesicles, external beam 4500 cGy in 25 sessions   Seasonal allergies    Skin cancer    scalp   Wears glasses     Past Surgical History:  Procedure Laterality Date   BACK SURGERY  2009   COLONOSCOPY W/ BIOPSIES AND POLYPECTOMY     CYSTOSCOPY W/ URETERAL STENT PLACEMENT Left 05/12/2021   Procedure: CYSTOSCOPY WITH RETROGRADE PYELOGRAM/ LEFT URETERAL STENT PLACEMENT.;  Surgeon: [name], MD;  Location: [facility];  Service: Urology;  Laterality: Left;   [place]   LEFT HEART CATH AND CORONARY ANGIOGRAPHY N/A 03/14/2019   Procedure: LEFT HEART CATH AND CORONARY ANGIOGRAPHY;  Surgeon: [name], MD;  Location: [facility];  Service: Cardiovascular;  Laterality: N/A;   PROSTATE BIOPSY  2013, 2014, 01/2014    Gleason 7   RADIOACTIVE SEED IMPLANT N/A 07/01/2014   Procedure: RADIOACTIVE SEED IMPLANT;  Surgeon: [name], MD;  Location: [facility];  Service: Urology;  Laterality: N/A;  DR PORTABLE   TONSILLECTOMY     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   Procedure: SHUNT INSERTION VENTRICULAR-PERITONEAL;  Surgeon: [name], MD;  Location: [facility];  Service: Neurosurgery;  Laterality: N/A;  SHUNT INSERTION VENTRICULAR-PERITONEAL    Family History  Problem Relation Age of Onset   Heart attack Father 75   Cancer Father        prostate   Diabetes Father    Cancer Paternal Uncle        prostate   Dementia Mother 25   Lung cancer Brother     Social History:  reports that he quit smoking about 60 years ago. His smoking use included cigarettes. He has never used smokeless tobacco. He reports current alcohol use of about 1.0 standard drink per week. He reports that he does not use drugs.  Allergies:  Allergies  Allergen Reactions   Lipitor [Atorvastatin] Other (See Comments)    Hip pain    Medications:  Prior to Admission medications   Medication Sig Start Date End Date Taking? Authorizing Provider  acetaminophen (TYLENOL) 500 MG tablet Take 1 tablet (500  mg total) by mouth every 6 (six) hours as needed for mild pain or moderate pain. 05/13/21  Yes Thurnell Lose, MD  aspirin EC 81 MG tablet Take 81 mg by mouth daily.    Yes [provider]  carvedilol (COREG) 3.125 MG tablet Take 1 tablet (3.125 mg total) by mouth 2 (two) times daily with a meal. 05/13/21  Yes Thurnell Lose, MD  cholecalciferol (VITAMIN D3) 25 MCG (1000 UNIT) tablet Take 1,000 Units by mouth daily.   Yes [provider]  ezetimibe (ZETIA) 10 MG tablet Take 10 mg by mouth daily. 05/02/21  Yes [provider]  oxybutynin (DITROPAN XL) 10 MG 24 hr tablet Take 1 tablet (10 mg total) by mouth daily for 60 doses. 05/13/21 07/12/21 Yes [name], MD  pantoprazole  (PROTONIX) 20 MG tablet Take 20 mg by mouth daily before breakfast.  02/19/19  Yes [provider]  tamsulosin (FLOMAX) 0.4 MG CAPS capsule Take 1 capsule (0.4 mg total) by mouth daily after supper. 05/11/21  Yes Deno Etienne, DO  vitamin B-12 (CYANOCOBALAMIN) 250 MCG tablet Take 250 mcg by mouth daily.   Yes [provider]  cephALEXin (KEFLEX) 500 MG capsule Take 1 capsule (500 mg total) by mouth 3 (three) times daily. Patient not taking: Reported on 05/27/2021 05/13/21   Thurnell Lose, MD  ondansetron (ZOFRAN-ODT) 4 MG disintegrating tablet 4mg  ODT q4 hours prn nausea/vomit Patient not taking: Reported on 05/27/2021 05/11/21   Deno Etienne, DO  oxyCODONE (OXY IR/ROXICODONE) 5 MG immediate release tablet Take 1 tablet (5 mg total) by mouth every 8 (eight) hours as needed for severe pain or breakthrough pain. Patient not taking: Reported on 05/27/2021 05/13/21   Thurnell Lose, MD     Results for orders placed or performed during the hospital encounter of 05/27/21 (from the past 48 hour(s))  Lipase, blood     Status: Abnormal   Collection Time: 05/27/21 11:46 AM  Result Value Ref Range   Lipase 10 (L) 11 - 51 U/L    Comment: Performed at [facility], [address] [place], [place] [address]  Comprehensive metabolic panel     Status: Abnormal   Collection Time: 05/27/21 11:46 AM  Result Value Ref Range   Sodium 145 135 - 145 mmol/L   Potassium 3.9 3.5 - 5.1 mmol/L   Chloride 100 98 - 111 mmol/L   CO2 32 22 - 32 mmol/L   Glucose, Bld 209 (H) 70 - 99 mg/dL    Comment: Glucose reference range applies only to samples taken after fasting for at least 8 hours.   BUN 37 (H) 8 - 23 mg/dL   Creatinine, Ser 1.80 (H) 0.61 - 1.24 mg/dL   Calcium 9.4 8.9 - 10.3 mg/dL   Total Protein 7.3 6.5 - 8.1 g/dL   Albumin 4.2 3.5 - 5.0 g/dL   AST 24 15 - 41 U/L   ALT 18 0 - 44 U/L   Alkaline Phosphatase 64 38 - 126 U/L   Total Bilirubin 1.1 0.3 - 1.2 mg/dL   GFR,  Estimated 37 (L) >60 mL/min    Comment: (NOTE) Calculated using the CKD-EPI Creatinine Equation (2021)    Anion gap 13 5 - 15    Comment: Performed at [facility], [address], [place], [place] [address]  CBC     Status: None   Collection Time: 05/27/21 11:46 AM  Result Value Ref Range   WBC 9.2 4.0 -  10.5 K/uL   RBC 5.25 4.22 - 5.81 MIL/uL   Hemoglobin 16.3 13.0 - 17.0 g/dL   HCT 50.9 39.0 - 52.0 %   MCV 97.0 80.0 - 100.0 fL   MCH 31.0 26.0 - 34.0 pg   MCHC 32.0 30.0 - 36.0 g/dL   RDW 13.9 11.5 - 15.5 %   Platelets 211 150 - 400 K/uL   nRBC 0.0 0.0 - 0.2 %    Comment: Performed at [facility], [address], [place], [place] [address]  Resp Panel by RT-PCR (Flu A&B, Covid) Nasopharyngeal Swab     Status: None   Collection Time: 05/27/21  7:13 PM   Specimen: Nasopharyngeal Swab; Nasopharyngeal(NP) swabs in vial transport medium  Result Value Ref Range   SARS Coronavirus 2 by RT PCR NEGATIVE NEGATIVE    Comment: (NOTE) SARS-CoV-2 target nucleic acids are NOT DETECTED.  The SARS-CoV-2 RNA is generally detectable in upper respiratory specimens during the acute phase of infection. The lowest concentration of SARS-CoV-2 viral copies this assay can detect is 138 copies/mL. A negative result does not preclude SARS-Cov-2 infection and should not be used as the sole basis for treatment or other patient management decisions. A negative result may occur with  improper specimen collection/handling, submission of specimen other than nasopharyngeal swab, presence of viral mutation(s) within the areas targeted by this assay, and inadequate number of viral copies(<138 copies/mL). A negative result must be combined with clinical observations, patient history, and epidemiological information. The expected result is Negative.  Fact Sheet for Patients:  EntrepreneurPulse.com.au  Fact Sheet for Healthcare Providers:   IncredibleEmployment.be  This test is no t yet approved or cleared by the [place] FDA and  has been authorized for detection and/or diagnosis of SARS-CoV-2 by FDA under an Emergency Use Authorization (EUA). This EUA will remain  in effect (meaning this test can be used) for the duration of the COVID-19 declaration under Section 564(b)(1) of the Act, 21 U.S.C.section 360bbb-3(b)(1), unless the authorization is terminated  or revoked sooner.       Influenza A by PCR NEGATIVE NEGATIVE   Influenza B by PCR NEGATIVE NEGATIVE    Comment: (NOTE) The Xpert Xpress SARS-CoV-2/FLU/RSV plus assay is intended as an aid in the diagnosis of influenza from Nasopharyngeal swab specimens and should not be used as a sole basis for treatment. Nasal washings and aspirates are unacceptable for Xpert Xpress SARS-CoV-2/FLU/RSV testing.  Fact Sheet for Patients: EntrepreneurPulse.com.au  Fact Sheet for Healthcare Providers: IncredibleEmployment.be  This test is not yet approved or cleared by the [place] FDA and has been authorized for detection and/or diagnosis of SARS-CoV-2 by FDA under an Emergency Use Authorization (EUA). This EUA will remain in effect (meaning this test can be used) for the duration of the COVID-19 declaration under Section 564(b)(1) of the Act, 21 U.S.C. section 360bbb-3(b)(1), unless the authorization is terminated or revoked.  Performed at [facility], [address] [place], [place] [address]   Urinalysis, Routine w reflex microscopic     Status: Abnormal   Collection Time: 05/27/21  9:04 PM  Result Value Ref Range   Color, Urine AMBER (A) YELLOW    Comment: BIOCHEMICALS MAY BE AFFECTED BY COLOR   APPearance CLOUDY (A) CLEAR   Specific Gravity, Urine 1.023 1.005 - 1.030   pH 5.0 5.0 - 8.0   Glucose, UA NEGATIVE NEGATIVE mg/dL   Hgb urine dipstick LARGE (A) NEGATIVE   Bilirubin  Urine NEGATIVE NEGATIVE   Ketones,  ur NEGATIVE NEGATIVE mg/dL   Protein, ur 100 (A) NEGATIVE mg/dL   Nitrite NEGATIVE NEGATIVE   Leukocytes,Ua NEGATIVE NEGATIVE   RBC / HPF >50 (H) 0 - 5 RBC/hpf   WBC, UA 21-50 0 - 5 WBC/hpf   Bacteria, UA FEW (A) NONE SEEN   WBC Clumps PRESENT    Mucus PRESENT    Hyaline Casts, UA PRESENT     Comment: Performed at [facility], [place] [address], [place], [place] [address]  Glucose, capillary     Status: Abnormal   Collection Time: 05/28/21  4:12 AM  Result Value Ref Range   Glucose-Capillary 163 (H) 70 - 99 mg/dL    Comment: Glucose reference range applies only to samples taken after fasting for at least 8 hours.  CBC with Differential/Platelet     Status: Abnormal   Collection Time: 05/28/21  6:01 AM  Result Value Ref Range   WBC 6.4 4.0 - 10.5 K/uL   RBC 4.57 4.22 - 5.81 MIL/uL   Hemoglobin 14.3 13.0 - 17.0 g/dL   HCT 46.0 39.0 - 52.0 %   MCV 100.7 (H) 80.0 - 100.0 fL   MCH 31.3 26.0 - 34.0 pg   MCHC 31.1 30.0 - 36.0 g/dL   RDW 14.0 11.5 - 15.5 %   Platelets 167 150 - 400 K/uL   nRBC 0.0 0.0 - 0.2 %   Neutrophils Relative % 72 %   Neutro Abs 4.7 1.7 - 7.7 K/uL   Lymphocytes Relative 13 %   Lymphs Abs 0.8 0.7 - 4.0 K/uL   Monocytes Relative 13 %   Monocytes Absolute 0.8 0.1 - 1.0 K/uL   Eosinophils Relative 1 %   Eosinophils Absolute 0.1 0.0 - 0.5 K/uL   Basophils Relative 0 %   Basophils Absolute 0.0 0.0 - 0.1 K/uL   Immature Granulocytes 1 %   Abs Immature Granulocytes 0.03 0.00 - 0.07 K/uL    Comment: Performed at [facility], [place] [address], [place], [place] [address]  Comprehensive metabolic panel     Status: Abnormal   Collection Time: 05/28/21  6:01 AM  Result Value Ref Range   Sodium 142 135 - 145 mmol/L   Potassium 3.2 (L) 3.5 - 5.1 mmol/L   Chloride 100 98 - 111 mmol/L   CO2 35 (H) 22 - 32 mmol/L   Glucose, Bld 139 (H) 70 - 99 mg/dL    Comment: Glucose reference range applies only to  samples taken after fasting for at least 8 hours.   BUN 45 (H) 8 - 23 mg/dL   Creatinine, Ser 1.97 (H) 0.61 - 1.24 mg/dL   Calcium 8.2 (L) 8.9 - 10.3 mg/dL   Total Protein 6.0 (L) 6.5 - 8.1 g/dL   Albumin 3.3 (L) 3.5 - 5.0 g/dL   AST 20 15 - 41 U/L   ALT 19 0 - 44 U/L   Alkaline Phosphatase 49 38 - 126 U/L   Total Bilirubin 1.2 0.3 - 1.2 mg/dL   GFR, Estimated 34 (L) >60 mL/min    Comment: (NOTE) Calculated using the CKD-EPI Creatinine Equation (2021)    Anion gap 7 5 - 15    Comment: Performed at [facility], [place] [address] [place], [place] [address]  Magnesium     Status: None   Collection Time: 05/28/21  6:01 AM  Result Value Ref Range   Magnesium 2.0 1.7 - 2.4 mg/dL    Comment: Performed at [facility], [place] [address] [place], [place] [address]  Glucose, capillary     Status: Abnormal   Collection Time: 05/28/21  8:01 AM  Result Value Ref Range   Glucose-Capillary 142 (H) 70 - 99 mg/dL    Comment: Glucose reference range applies only to samples taken after fasting for at least 8 hours.    CT ABDOMEN PELVIS WO CONTRAST  Result Date: 05/27/2021 CLINICAL DATA:  Bowel obstruction suspected. 2 days N/V/D vomiting several times a day as well as diarrhea. Pt drank 60 minutes prior EXAM: CT ABDOMEN AND PELVIS WITHOUT CONTRAST TECHNIQUE: Multidetector CT imaging of the abdomen and pelvis was performed following the standard protocol without IV contrast. COMPARISON:  CT renal 05/11/2021 FINDINGS: Lower chest: Elevated right hemidiaphragm with atelectasis of the right base. Scarring versus atelectasis of bilateral lower lobes. Trace right pleural effusion with a ventricular shunt terminating within the right pleural space. Left anterior descending coronary calcification. Hepatobiliary: No focal liver abnormality. Calcified gallstone noted within the gallbladder lumen. No gallbladder wall thickening or pericholecystic fluid. No biliary dilatation.  Pancreas: Diffusely atrophic. No focal lesion. Otherwise normal pancreatic contour. No surrounding inflammatory changes. No main pancreatic ductal dilatation. Spleen: Normal in size without focal abnormality. Adrenals/Urinary Tract: No adrenal nodule bilaterally. Multiple calcified stones within the right kidney measuring up to 3 mm. No left nephrolithiasis. A left ureteral stent is noted with proximal pigtail terminating in the region of a decompressed left renal pelvis and proximal pigtail terminating within the urinary bladder lumen.No definite contour-deforming renal mass. No ureterolithiasis or hydroureter. The urinary bladder is unremarkable. Stomach/Bowel: Stomach is within normal limits. The proximal and early mid small bowel are dilated with air and fluid measuring up to 4 cm in caliber. No definite transition point. The mid to distal small bowel is however decompressed. No evidence of large bowel wall thickening or dilatation. Diffuse sigmoid and otherwise scattered colonic diverticulosis. The appendix is not definitely identified with no inflammatory changes in the right lower quadrant to suggest acute appendicitis. 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 This extends approximately 3-4 cm in the craniocaudal dimension. Mild atherosclerotic plaque of the aorta and its branches. No abdominal, pelvic, or inguinal lymphadenopathy. Reproductive: Prostate radiation beads noted. Other: No intraperitoneal free fluid. No intraperitoneal free gas. No organized fluid collection. Musculoskeletal: No abdominal wall hernia or abnormality. No suspicious lytic or blastic osseous lesions. No acute displaced fracture. Multilevel degenerative changes of the spine. Stable grade 1 L4 on L5 anterolisthesis in a patient status post posterolateral fusion interbody fusion at this level. IMPRESSION: 1. Findings suggestive an early or partial small  bowel obstruction. No definite transition point. Differential diagnosis includes ileus. No bowel perforation. 2. Colonic diverticulosis with no acute diverticulitis. 3. Nonobstructive right nephrolithiasis measuring up to 3 mm. 4. Left ureteral stent in a decompressed left collecting system in likely appropriate position. Limited evaluation on this noncontrast study. 5. Cholelithiasis with no acute cholecystitis. 6. 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 Recommend annual imaging followup by CTA or MRA. This recommendation follows 2010 ACCF/AHA/AATS/ACR/ASA/SCA/SCAI/SIR/STS/SVM Guidelines for the Diagnosis and Management of Patients with Thoracic Aortic Disease. Circulation. 2010; 121: T267-T245. 7. Aortic aneurysm NOS (ICD10-I71.9). Aortic Atherosclerosis (ICD10-I70.0). Electronically Signed   By: Iven Finn M.D.   On: 05/27/2021 16:13   DG Chest Portable 1 View  Result Date: 05/27/2021 CLINICAL DATA:  Nasogastric tube placement EXAM: PORTABLE CHEST 1 VIEW COMPARISON:  Portable  exam 1807 hours compared to 05/12/2021 FINDINGS: Nasogastric tube tip projects over proximal stomach. VP shunt tubing traverses RIGHT hemithorax. Bowel interposition between liver and diaphragm. Enlargement of cardiac silhouette. Mediastinal contours and pulmonary vascularity normal. Atherosclerotic calcification aorta. RIGHT basilar atelectasis without pulmonary infiltrate, pleural effusion, or pneumothorax. IMPRESSION: Nasogastric tube projects over proximal stomach. Enlargement of cardiac silhouette with RIGHT basilar atelectasis. Aortic Atherosclerosis (ICD10-I70.0). Electronically Signed   By: Lavonia Dana M.D.   On: 05/27/2021 18:18    Review of Systems  HENT:  Negative for ear discharge, ear pain, hearing loss and tinnitus.   Eyes:  Negative for photophobia and pain.  Respiratory:  Negative for cough and shortness of breath.   Cardiovascular:  Negative for chest pain.  Gastrointestinal:   Positive for abdominal distention, diarrhea, nausea and vomiting. Negative for abdominal pain.  Genitourinary:  Negative for dysuria, flank pain, frequency and urgency.  Musculoskeletal:  Negative for back pain, myalgias and neck pain.  Neurological:  Negative for dizziness and headaches.  Hematological:  Does not bruise/bleed easily.  Psychiatric/Behavioral:  The patient is not nervous/anxious.     Blood pressure 116/69, pulse 85, temperature 98.1 F (36.7 C), temperature source Oral, resp. rate 18, height 5\' 10"  (1.778 m), weight 85.2 kg, SpO2 92 %. Physical Exam Constitutional:  WDWN in NAD, conversant, no obvious deformities; lying in bed comfortably Eyes:  Pupils equal, round; sclera anicteric; moist conjunctiva; no lid lag HENT:  Oral mucosa moist; good dentition  Neck:  No masses palpated, trachea midline; no thyromegaly Lungs:  CTA bilaterally; normal respiratory effort CV:  Regular rate and rhythm; no murmurs; extremities well-perfused with no edema Abd:  +bowel sounds, soft, non-tender, no palpable organomegaly; no palpable hernias Musc:  Unable to assess gait; no apparent clubbing or cyanosis in extremities Lymphatic:  No palpable cervical or axillary lymphadenopathy Skin:  Warm, dry; no sign of jaundice Psychiatric - alert and oriented x 4; calm mood and affect  Assessment/Plan: Partial SBO vs. Ileus seen on CT scan - clinically he is not obstructed Could be secondary to adhesions - VP shunt, pelvic radiation from prostate seeds  Clamp NG tube Short course of Reglan Clear liquids - if he becomes bloated or nauseated, replace to ILWS Follow-up plain films in AM  At this time, there is no indication for surgical intervention.   Imogene Burn Marsha Hillman 05/28/2021, 8:28 AM   MDM - Moderate

## 2021-05-28 NOTE — Plan of Care (Signed)
°  Problem: Activity: Goal: Risk for activity intolerance will decrease Outcome: Progressing   Problem: Elimination: Goal: Will not experience complications related to bowel motility Outcome: Progressing Goal: Will not experience complications related to urinary retention Outcome: Progressing   Problem: Pain Managment: Goal: General experience of comfort will improve Outcome: Progressing   Problem: Safety: Goal: Ability to remain free from injury will improve Outcome: Progressing   Problem: Nutrition: Goal: Adequate nutrition will be maintained Outcome: Not Progressing

## 2021-05-29 ENCOUNTER — Inpatient Hospital Stay (HOSPITAL_COMMUNITY): Payer: Medicare Other

## 2021-05-29 LAB — GLUCOSE, CAPILLARY
Glucose-Capillary: 117 mg/dL — ABNORMAL HIGH (ref 70–99)
Glucose-Capillary: 134 mg/dL — ABNORMAL HIGH (ref 70–99)
Glucose-Capillary: 139 mg/dL — ABNORMAL HIGH (ref 70–99)
Glucose-Capillary: 149 mg/dL — ABNORMAL HIGH (ref 70–99)
Glucose-Capillary: 154 mg/dL — ABNORMAL HIGH (ref 70–99)
Glucose-Capillary: 165 mg/dL — ABNORMAL HIGH (ref 70–99)

## 2021-05-29 NOTE — Progress Notes (Signed)
Subjective/Chief Complaint: Patient denies any nausea, vomiting, or abdominal distention His NG was placed back to suction yesterday for unclear reasons - patient states that he did not have any nausea or vomiting He has had a couple of episodes of diarrhea   Objective: Vital signs in last 24 hours: Temp:  [98.2 F (36.8 C)-98.4 F (36.9 C)] 98.2 F (36.8 C) (01/08 0449) Pulse Rate:  [85-96] 86 (01/08 0449) Resp:  [14-18] 18 (01/08 0449) BP: (106-135)/(70-75) 125/70 (01/08 0449) SpO2:  [90 %-95 %] 92 % (01/08 0449) Last BM Date: 05/28/21  Intake/Output from previous day: 01/07 0701 - 01/08 0700 In: 1776.3 [P.O.:1020; I.V.:556.3; IV Piggyback:200] Out: 2300 [Urine:725; Emesis/NG QIWLNL:8921] Intake/Output this shift: No intake/output data recorded.  General appearance: alert, cooperative, and no distress GI: soft, non-tender, non-distended; no masses or hernias  Lab Results:  Recent Labs    05/27/21 1146 05/28/21 0601  WBC 9.2 6.4  HGB 16.3 14.3  HCT 50.9 46.0  PLT 211 167   BMET Recent Labs    05/27/21 1146 05/28/21 0601  NA 145 142  K 3.9 3.2*  CL 100 100  CO2 32 35*  GLUCOSE 209* 139*  BUN 37* 45*  CREATININE 1.80* 1.97*  CALCIUM 9.4 8.2*   PT/INR No results for input(s): LABPROT, INR in the last 72 hours. ABG No results for input(s): PHART, HCO3 in the last 72 hours.  Invalid input(s): PCO2, PO2  Studies/Results: CT ABDOMEN PELVIS WO CONTRAST  Result Date: 05/27/2021 CLINICAL DATA:  Bowel obstruction suspected. 2 days N/V/D vomiting several times a day as well as diarrhea. Pt drank 60 minutes prior EXAM: CT ABDOMEN AND PELVIS WITHOUT CONTRAST TECHNIQUE: Multidetector CT imaging of the abdomen and pelvis was performed following the standard protocol without IV contrast. COMPARISON:  CT renal 05/11/2021 FINDINGS: Lower chest: Elevated right hemidiaphragm with atelectasis of the right base. Scarring versus atelectasis of bilateral lower lobes. Trace  right pleural effusion with a ventricular shunt terminating within the right pleural space. Left anterior descending coronary calcification. Hepatobiliary: No focal liver abnormality. Calcified gallstone noted within the gallbladder lumen. No gallbladder wall thickening or pericholecystic fluid. No biliary dilatation. Pancreas: Diffusely atrophic. No focal lesion. Otherwise normal pancreatic contour. No surrounding inflammatory changes. No main pancreatic ductal dilatation. Spleen: Normal in size without focal abnormality. Adrenals/Urinary Tract: No adrenal nodule bilaterally. Multiple calcified stones within the right kidney measuring up to 3 mm. No left nephrolithiasis. A left ureteral stent is noted with proximal pigtail terminating in the region of a decompressed left renal pelvis and proximal pigtail terminating within the urinary bladder lumen.No definite contour-deforming renal mass. No ureterolithiasis or hydroureter. The urinary bladder is unremarkable. Stomach/Bowel: Stomach is within normal limits. The proximal and early mid small bowel are dilated with air and fluid measuring up to 4 cm in caliber. No definite transition point. The mid to distal small bowel is however decompressed. No evidence of large bowel wall thickening or dilatation. Diffuse sigmoid and otherwise scattered colonic diverticulosis. The appendix is not definitely identified with no inflammatory changes in the right lower quadrant to suggest acute appendicitis. 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 This extends approximately 3-4 cm in the craniocaudal dimension. Mild atherosclerotic plaque of the aorta and its branches. No abdominal, pelvic, or inguinal lymphadenopathy. Reproductive: Prostate radiation beads noted. Other: No intraperitoneal free fluid. No intraperitoneal free gas. No organized fluid collection. Musculoskeletal: No abdominal wall hernia or  abnormality.  No suspicious lytic or blastic osseous lesions. No acute displaced fracture. Multilevel degenerative changes of the spine. Stable grade 1 L4 on L5 anterolisthesis in a patient status post posterolateral fusion interbody fusion at this level. IMPRESSION: 1. Findings suggestive an early or partial small bowel obstruction. No definite transition point. Differential diagnosis includes ileus. No bowel perforation. 2. Colonic diverticulosis with no acute diverticulitis. 3. Nonobstructive right nephrolithiasis measuring up to 3 mm. 4. Left ureteral stent in a decompressed left collecting system in likely appropriate position. Limited evaluation on this noncontrast study. 5. Cholelithiasis with no acute cholecystitis. 6. 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 Recommend annual imaging followup by CTA or MRA. This recommendation follows 2010 ACCF/AHA/AATS/ACR/ASA/SCA/SCAI/SIR/STS/SVM Guidelines for the Diagnosis and Management of Patients with Thoracic Aortic Disease. Circulation. 2010; 121: T364-W803. 7. Aortic aneurysm NOS (ICD10-I71.9). Aortic Atherosclerosis (ICD10-I70.0). Electronically Signed   By: Iven Finn M.D.   On: 05/27/2021 16:13   DG Abd 1 View  Result Date: 05/28/2021 CLINICAL DATA:  Small bowel obstruction. EXAM: ABDOMEN - 1 VIEW COMPARISON:  CT examination dated May 27, 2021 FINDINGS: NG tube projecting over the gastric body. Right-sided drain is again noted. Multiple dilated bowel loops measuring up to 4.9 cm concerning for ileus/obstruction. Left ureteral stent is unchanged. Posterior spinal fusion at L5-S1. No acute osseous abnormality. IMPRESSION: Multiple dilated small bowel loops measuring up to 4.9 cm concerning for bowel obstruction/ileus. Electronically Signed   By: Keane Police D.O.   On: 05/28/2021 09:04   DG Chest Portable 1 View  Result Date: 05/27/2021 CLINICAL DATA:  Nasogastric tube placement EXAM: PORTABLE CHEST 1 VIEW COMPARISON:   Portable exam 1807 hours compared to 05/12/2021 FINDINGS: Nasogastric tube tip projects over proximal stomach. VP shunt tubing traverses RIGHT hemithorax. Bowel interposition between liver and diaphragm. Enlargement of cardiac silhouette. Mediastinal contours and pulmonary vascularity normal. Atherosclerotic calcification aorta. RIGHT basilar atelectasis without pulmonary infiltrate, pleural effusion, or pneumothorax. IMPRESSION: Nasogastric tube projects over proximal stomach. Enlargement of cardiac silhouette with RIGHT basilar atelectasis. Aortic Atherosclerosis (ICD10-I70.0). Electronically Signed   By: Lavonia Dana M.D.   On: 05/27/2021 18:18    Anti-infectives: Anti-infectives (From admission, onward)    None       Assessment/Plan: Partial SBO vs. Ileus seen on CT scan - clinically he is not obstructed Could be secondary to adhesions - VP shunt, pelvic radiation from prostate seeds   Clamp NG tube - discussed clearly with nursing.   Short course of Reglan Full liquids - if he becomes bloated or nauseated, replace to ILWS - please document reason if this happens Follow-up plain films   At this time, there is no indication for surgical intervention.    LOS: 2 days    Maia Petties 05/29/2021

## 2021-05-29 NOTE — Progress Notes (Signed)
PROGRESS NOTE    Jeremiah [name]  EUM:[id number] DOB: [birth year] DOA: 05/27/2021 PCP: [name], MD    Brief Narrative:  This 81 years old male with PMH significant of BPH, type 2 diabetes, hypertension, hyperlipidemia, history of VP shunt, OSA, GERD, AAA, prostate cancer s/p radiation therapy, BPH, CKD stage IIIb and IV presented in the ED with complaints of abdominal pain associated with nausea vomiting and loose stools for 2 days.  CT abdomen and pelvis showed findings suggestive of early or partial small bowel obstruction.  No definite transition point was noted. Patient is admitted for partial small bowel obstruction, general surgery consulted. Patient is kept n.p.o. started on NG tube decompression.  Assessment & Plan:   Principal Problem:   SBO (small bowel obstruction) (HCC) Active Problems:   HTN (hypertension)   AAA (abdominal aortic aneurysm)   DMII (diabetes mellitus, type 2) (HCC)   Hypoxia  Partial small bowel obstruction.: Patient presented with nausea, vomiting and diarrhea. CT abdomen showed partial small bowel obstruction without definite transition point. General surgery consulted, Continue npo. IV hydration,  adequate pain control Antiemetics as needed.  Clamp NG tube. Continue full liquids, if he becomes bloated or nauseated, continue  NGT Decompression. Repeat x-ray abdomen in the morning.  Type 2 diabetes: Continue regular insulin sliding scale.  Essential hypertension: Blood pressure is stable, Continue IV labetalol as needed.  AAA: Needs outpatient vascular surgery follow-up.  GERD: IV Protonix  CKD stage IIIb-IV: Serum creatinine at baseline. Avoid nephrotoxic medications, Continue to monitor serum creatinine  DVT prophylaxis: SCDs Code Status: Partial code Family Communication: No family at bedside Disposition Plan:   Status is: Inpatient  Remains inpatient appropriate because: Admitted for small bowel obstruction.  Anticipated  discharge home in few days.  Consultants:  General surgery  Procedures: CT abdomen pelvis Antimicrobials: None  Subjective: Patient was seen and examined at bedside.  Overnight events noted.   Patient reports feeling much improved.  Patient was sitting on the chair,  states he is able to pass flatus.  He has not had a bowel movement yet.  Objective: Vitals:   05/28/21 1335 05/28/21 2038 05/29/21 0449 05/29/21 1336  BP: 113/75 135/73 125/70 134/77  Pulse: 87 96 86 85  Resp: 14 16 18 13   Temp: 98.4 F (36.9 C) 98.2 F (36.8 C) 98.2 F (36.8 C) 98.1 F (36.7 C)  TempSrc: Oral Oral Oral Oral  SpO2: 93% 95% 92% 93%  Weight:      Height:        Intake/Output Summary (Last 24 hours) at 05/29/2021 1447 Last data filed at 05/29/2021 1403 Gross per 24 hour  Intake 2016.28 ml  Output 2325 ml  Net -308.72 ml   Filed Weights   05/27/21 1130 05/27/21 2121  Weight: 86.2 kg 85.2 kg    Examination:  General exam: Appears comfortable, not in any acute distress. Respiratory system: Clear to auscultation bilaterally. Respiratory effort normal. Cardiovascular system: S1-S2 heard, regular rate and rhythm, no murmur.   Gastrointestinal system: Abdomen is soft, nondistended, nontender, VQ:[id number] Central nervous system: Alert and oriented x 3. No focal neurological deficits. Extremities: No edema, no cyanosis, no clubbing. Skin: No rashes, lesions or ulcers Psychiatry: Judgement and insight appear normal. Mood & affect appropriate.     Data Reviewed: I have personally reviewed following labs and imaging studies  CBC: Recent Labs  Lab 05/27/21 1146 05/28/21 0601  WBC 9.2 6.4  NEUTROABS  --  4.7  HGB 16.3 14.3  HCT  50.9 46.0  MCV 97.0 100.7*  PLT 211 546   Basic Metabolic Panel: Recent Labs  Lab 05/27/21 1146 05/28/21 0601  NA 145 142  K 3.9 3.2*  CL 100 100  CO2 32 35*  GLUCOSE 209* 139*  BUN 37* 45*  CREATININE 1.80* 1.97*  CALCIUM 9.4 8.2*  MG  --  2.0    GFR: Estimated Creatinine Clearance: 30.4 mL/min (A) (by C-G formula based on SCr of 1.97 mg/dL (H)). Liver Function Tests: Recent Labs  Lab 05/27/21 1146 05/28/21 0601  AST 24 20  ALT 18 19  ALKPHOS 64 49  BILITOT 1.1 1.2  PROT 7.3 6.0*  ALBUMIN 4.2 3.3*   Recent Labs  Lab 05/27/21 1146  LIPASE 10*   No results for input(s): AMMONIA in the last 168 hours. Coagulation Profile: No results for input(s): INR, PROTIME in the last 168 hours. Cardiac Enzymes: No results for input(s): CKTOTAL, CKMB, CKMBINDEX, TROPONINI in the last 168 hours. BNP (last 3 results) No results for input(s): PROBNP in the last 8760 hours. HbA1C: Recent Labs    05/28/21 0601  HGBA1C 7.2*   CBG: Recent Labs  Lab 05/28/21 1949 05/28/21 2342 05/29/21 0425 05/29/21 0757 05/29/21 1155  GLUCAP 166* 141* 165* 139* 134*   Lipid Profile: No results for input(s): CHOL, HDL, LDLCALC, TRIG, CHOLHDL, LDLDIRECT in the last 72 hours. Thyroid Function Tests: No results for input(s): TSH, T4TOTAL, FREET4, T3FREE, THYROIDAB in the last 72 hours. Anemia Panel: No results for input(s): VITAMINB12, FOLATE, FERRITIN, TIBC, IRON, RETICCTPCT in the last 72 hours. Sepsis Labs: No results for input(s): PROCALCITON, LATICACIDVEN in the last 168 hours.  Recent Results (from the past 240 hour(s))  Resp Panel by RT-PCR (Flu A&B, Covid) Nasopharyngeal Swab     Status: None   Collection Time: 05/27/21  7:13 PM   Specimen: Nasopharyngeal Swab; Nasopharyngeal(NP) swabs in vial transport medium  Result Value Ref Range Status   SARS Coronavirus 2 by RT PCR NEGATIVE NEGATIVE Final    Comment: (NOTE) SARS-CoV-2 target nucleic acids are NOT DETECTED.  The SARS-CoV-2 RNA is generally detectable in upper respiratory specimens during the acute phase of infection. The lowest concentration of SARS-CoV-2 viral copies this assay can detect is 138 copies/mL. A negative result does not preclude SARS-Cov-2 infection and  should not be used as the sole basis for treatment or other patient management decisions. A negative result may occur with  improper specimen collection/handling, submission of specimen other than nasopharyngeal swab, presence of viral mutation(s) within the areas targeted by this assay, and inadequate number of viral copies(<138 copies/mL). A negative result must be combined with clinical observations, patient history, and epidemiological information. The expected result is Negative.  Fact Sheet for Patients:  EntrepreneurPulse.com.au  Fact Sheet for Healthcare Providers:  IncredibleEmployment.be  This test is no t yet approved or cleared by the [place] FDA and  has been authorized for detection and/or diagnosis of SARS-CoV-2 by FDA under an Emergency Use Authorization (EUA). This EUA will remain  in effect (meaning this test can be used) for the duration of the COVID-19 declaration under Section 564(b)(1) of the Act, 21 U.S.C.section 360bbb-3(b)(1), unless the authorization is terminated  or revoked sooner.       Influenza A by PCR NEGATIVE NEGATIVE Final   Influenza B by PCR NEGATIVE NEGATIVE Final    Comment: (NOTE) The Xpert Xpress SARS-CoV-2/FLU/RSV plus assay is intended as an aid in the diagnosis of influenza from Nasopharyngeal swab specimens and should  not be used as a sole basis for treatment. Nasal washings and aspirates are unacceptable for Xpert Xpress SARS-CoV-2/FLU/RSV testing.  Fact Sheet for Patients: EntrepreneurPulse.com.au  Fact Sheet for Healthcare Providers: IncredibleEmployment.be  This test is not yet approved or cleared by the [place] FDA and has been authorized for detection and/or diagnosis of SARS-CoV-2 by FDA under an Emergency Use Authorization (EUA). This EUA will remain in effect (meaning this test can be used) for the duration of the COVID-19 declaration  under Section 564(b)(1) of the Act, 21 U.S.C. section 360bbb-3(b)(1), unless the authorization is terminated or revoked.  Performed at [facility], [address] [place], [place] [address]     Radiology Studies: CT ABDOMEN PELVIS WO CONTRAST  Result Date: 05/27/2021 CLINICAL DATA:  Bowel obstruction suspected. 2 days N/V/D vomiting several times a day as well as diarrhea. Pt drank 60 minutes prior EXAM: CT ABDOMEN AND PELVIS WITHOUT CONTRAST TECHNIQUE: Multidetector CT imaging of the abdomen and pelvis was performed following the standard protocol without IV contrast. COMPARISON:  CT renal 05/11/2021 FINDINGS: Lower chest: Elevated right hemidiaphragm with atelectasis of the right base. Scarring versus atelectasis of bilateral lower lobes. Trace right pleural effusion with a ventricular shunt terminating within the right pleural space. Left anterior descending coronary calcification. Hepatobiliary: No focal liver abnormality. Calcified gallstone noted within the gallbladder lumen. No gallbladder wall thickening or pericholecystic fluid. No biliary dilatation. Pancreas: Diffusely atrophic. No focal lesion. Otherwise normal pancreatic contour. No surrounding inflammatory changes. No main pancreatic ductal dilatation. Spleen: Normal in size without focal abnormality. Adrenals/Urinary Tract: No adrenal nodule bilaterally. Multiple calcified stones within the right kidney measuring up to 3 mm. No left nephrolithiasis. A left ureteral stent is noted with proximal pigtail terminating in the region of a decompressed left renal pelvis and proximal pigtail terminating within the urinary bladder lumen.No definite contour-deforming renal mass. No ureterolithiasis or hydroureter. The urinary bladder is unremarkable. Stomach/Bowel: Stomach is within normal limits. The proximal and early mid small bowel are dilated with air and fluid measuring up to 4 cm in caliber. No definite transition point.  The mid to distal small bowel is however decompressed. No evidence of large bowel wall thickening or dilatation. Diffuse sigmoid and otherwise scattered colonic diverticulosis. The appendix is not definitely identified with no inflammatory changes in the right lower quadrant to suggest acute appendicitis. 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 This extends approximately 3-4 cm in the craniocaudal dimension. Mild atherosclerotic plaque of the aorta and its branches. No abdominal, pelvic, or inguinal lymphadenopathy. Reproductive: Prostate radiation beads noted. Other: No intraperitoneal free fluid. No intraperitoneal free gas. No organized fluid collection. Musculoskeletal: No abdominal wall hernia or abnormality. No suspicious lytic or blastic osseous lesions. No acute displaced fracture. Multilevel degenerative changes of the spine. Stable grade 1 L4 on L5 anterolisthesis in a patient status post posterolateral fusion interbody fusion at this level. IMPRESSION: 1. Findings suggestive an early or partial small bowel obstruction. No definite transition point. Differential diagnosis includes ileus. No bowel perforation. 2. Colonic diverticulosis with no acute diverticulitis. 3. Nonobstructive right nephrolithiasis measuring up to 3 mm. 4. Left ureteral stent in a decompressed left collecting system in likely appropriate position. Limited evaluation on this noncontrast study. 5. Cholelithiasis with no acute cholecystitis. 6. 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 Recommend annual imaging followup by CTA or MRA. This recommendation follows 2010 ACCF/AHA/AATS/ACR/ASA/SCA/SCAI/SIR/STS/SVM Guidelines for the Diagnosis  and Management of Patients with Thoracic Aortic Disease. Circulation. 2010; 121: H062-B762. 7. Aortic aneurysm NOS (ICD10-I71.9). Aortic Atherosclerosis (ICD10-I70.0). Electronically Signed   By:  Iven Finn M.D.   On: 05/27/2021 16:13   DG Abd 1 View  Result Date: 05/28/2021 CLINICAL DATA:  Small bowel obstruction. EXAM: ABDOMEN - 1 VIEW COMPARISON:  CT examination dated May 27, 2021 FINDINGS: NG tube projecting over the gastric body. Right-sided drain is again noted. Multiple dilated bowel loops measuring up to 4.9 cm concerning for ileus/obstruction. Left ureteral stent is unchanged. Posterior spinal fusion at L5-S1. No acute osseous abnormality. IMPRESSION: Multiple dilated small bowel loops measuring up to 4.9 cm concerning for bowel obstruction/ileus. Electronically Signed   By: Keane Police D.O.   On: 05/28/2021 09:04   DG Chest Portable 1 View  Result Date: 05/27/2021 CLINICAL DATA:  Nasogastric tube placement EXAM: PORTABLE CHEST 1 VIEW COMPARISON:  Portable exam 1807 hours compared to 05/12/2021 FINDINGS: Nasogastric tube tip projects over proximal stomach. VP shunt tubing traverses RIGHT hemithorax. Bowel interposition between liver and diaphragm. Enlargement of cardiac silhouette. Mediastinal contours and pulmonary vascularity normal. Atherosclerotic calcification aorta. RIGHT basilar atelectasis without pulmonary infiltrate, pleural effusion, or pneumothorax. IMPRESSION: Nasogastric tube projects over proximal stomach. Enlargement of cardiac silhouette with RIGHT basilar atelectasis. Aortic Atherosclerosis (ICD10-I70.0). Electronically Signed   By: Lavonia Dana M.D.   On: 05/27/2021 18:18   DG Abd Portable 2V  Result Date: 05/29/2021 CLINICAL DATA:  Follow-up small bowel obstruction. EXAM: PORTABLE ABDOMEN - 2 VIEW COMPARISON:  05/28/2021. FINDINGS: Previously seen small bowel dilation has significantly improved. Mildly dilated small bowel is now noted in the right mid to upper abdomen. Air is seen within the nondistended colon. No free air.  No significant air-fluid levels on the erect view. Nasogastric tube is stable, nasogastric 2 lies within the mid stomach. Left ureteral  stent is stable. IMPRESSION: 1. Significant interval improvement a notable decrease small-bowel dilation. Mildly dilated small bowel persists in the right mid to upper abdomen. No significant air-fluid levels on the erect view. Electronically Signed   By: Lajean Manes M.D.   On: 05/29/2021 11:29    Scheduled Meds:  insulin aspart  0-6 Units Subcutaneous Q4H   metoCLOPramide (REGLAN) injection  5 mg Intravenous Q6H   pantoprazole (PROTONIX) IV  20 mg Intravenous Q24H   Continuous Infusions:  lactated ringers 75 mL/hr at 05/28/21 2354     LOS: 2 days    Time spent:35 MINS    Shawna Clamp, MD [facility]   If 7PM-7AM, please contact night-coverage

## 2021-05-30 LAB — BASIC METABOLIC PANEL
Anion gap: 7 (ref 5–15)
BUN: 30 mg/dL — ABNORMAL HIGH (ref 8–23)
CO2: 32 mmol/L (ref 22–32)
Calcium: 8.2 mg/dL — ABNORMAL LOW (ref 8.9–10.3)
Chloride: 104 mmol/L (ref 98–111)
Creatinine, Ser: 1.35 mg/dL — ABNORMAL HIGH (ref 0.61–1.24)
GFR, Estimated: 53 mL/min — ABNORMAL LOW (ref >60)
Glucose, Bld: 158 mg/dL — ABNORMAL HIGH (ref 70–99)
Potassium: 3.4 mmol/L — ABNORMAL LOW (ref 3.5–5.1)
Sodium: 143 mmol/L (ref 135–145)

## 2021-05-30 LAB — PHOSPHORUS: Phosphorus: 2.8 mg/dL (ref 2.5–4.6)

## 2021-05-30 LAB — GLUCOSE, CAPILLARY
Glucose-Capillary: 155 mg/dL — ABNORMAL HIGH (ref 70–99)
Glucose-Capillary: 132 mg/dL — ABNORMAL HIGH (ref 70–99)
Glucose-Capillary: 157 mg/dL — ABNORMAL HIGH (ref 70–99)

## 2021-05-30 LAB — CBC
HCT: 42 % (ref 39.0–52.0)
Hemoglobin: 13.1 g/dL (ref 13.0–17.0)
MCH: 31.9 pg (ref 26.0–34.0)
MCHC: 31.2 g/dL (ref 30.0–36.0)
MCV: 102.2 fL — ABNORMAL HIGH (ref 80.0–100.0)
Platelets: 130 10*3/uL — ABNORMAL LOW (ref 150–400)
RBC: 4.11 MIL/uL — ABNORMAL LOW (ref 4.22–5.81)
RDW: 13.9 % (ref 11.5–15.5)
WBC: 7.4 10*3/uL (ref 4.0–10.5)
nRBC: 0 % (ref 0.0–0.2)

## 2021-05-30 LAB — MAGNESIUM: Magnesium: 1.8 mg/dL (ref 1.7–2.4)

## 2021-05-30 NOTE — Progress Notes (Signed)
Pt discharged home with spouse and daughter in stable condition. Discharge instructions given. Pt and family verbalized understanding. No immediate questions. Discharge from unit via wheelchair.

## 2021-05-30 NOTE — Discharge Summary (Addendum)
Physician Discharge Summary  Jeremiah [name] ZCH:[id number] DOB: May 22, 1941 DOA: 05/27/2021  PCP: [name], MD  Admit date: 05/27/2021  Discharge date: 05/30/2021  Admitted From: Home. Disposition: Home  Recommendations for Outpatient Follow-up:  Follow up with PCP in 1-2 weeks. Please obtain BMP/CBC in one week. Small bowel obstruction has resolved.  Home Health: None Equipment/Devices: Home oxygen  Discharge Condition: Stable CODE STATUS:Limited code Diet recommendation: Heart Healthy  Brief [facility]: This 81 years old male with PMH significant of BPH, type 2 diabetes, hypertension, hyperlipidemia, history of VP shunt, OSA, GERD, AAA, prostate cancer s/p radiation therapy, CKD stage IIIb and IV presented in the ED with complaints of abdominal pain associated with nausea , vomiting and loose stools for 2 days.  CT abdomen and pelvis showed findings suggestive of early or partial small bowel obstruction.  No definite transition point was noted. Patient was admitted for partial small bowel obstruction, general surgery consulted. Patient was kept n.p.o. started on NG tube decompression.  Patient was continued on IV hydration and adequate pain control.  Patient has slowly improved conservatively.  Patient was able to pass flatus and had a bowel movement.  NG tube was clamped and discontinued.  Patient was started on clear liquid diet , tolerated well  and resumed to soft diet.  Patient cleared from general surgery to be discharged. Patient is being discharged home.   He was managed for below problems  Discharge Diagnoses:  Principal Problem:   SBO (small bowel obstruction) ([place]) Active Problems:   HTN (hypertension)   AAA (abdominal aortic aneurysm)   DMII (diabetes mellitus, type 2) (H[facility])   Hypoxia  Partial small bowel obstruction.: Patient presented with nausea, vomiting and diarrhea. CT abdomen showed partial small bowel obstruction without definite transition  point. General surgery consulted, Continue npo. IV hydration,  adequate pain control Antiemetics as needed.  Clamp NG tube. Continue full liquids, if he becomes bloated or nauseated, continue  NGT Decompression. Repeat x-ray abdomen shows resolved small bowel obstruction. NG tube discontinued. Patient is being discharged home   Type 2 diabetes: Continue regular insulin sliding scale.   Essential hypertension: Blood pressure is stable, Continue IV labetalol as needed.   AAA: Needs outpatient vascular surgery follow-up.   GERD: IV Protonix   CKD stage IIIb-IV: Serum creatinine at baseline. Avoid nephrotoxic medications, Continue to monitor serum creatinine  Discharge Instructions  Discharge Instructions     Call MD for:  difficulty breathing, headache or visual disturbances   Complete by: As directed    Call MD for:  persistant dizziness or light-headedness   Complete by: As directed    Call MD for:  persistant nausea and vomiting   Complete by: As directed    Diet - low sodium heart healthy   Complete by: As directed    Diet Carb Modified   Complete by: As directed    Discharge instructions   Complete by: As directed    Advised to follow-up with primary care physician in 1 week. Advised to follow-up with general surgery as scheduled.  Small bowel obstruction is resolved.   Increase activity slowly   Complete by: As directed    No dressing needed   Complete by: As directed       Allergies as of 05/30/2021       Reactions   Lipitor [atorvastatin] Other (See Comments)   Hip pain        Medication List     STOP taking these medications  cephALEXin 500 MG capsule Commonly known as: KEFLEX   ondansetron 4 MG disintegrating tablet Commonly known as: ZOFRAN-ODT   oxyCODONE 5 MG immediate release tablet Commonly known as: Oxy IR/ROXICODONE       TAKE these medications    Acetaminophen Extra Strength 500 MG tablet Generic drug: acetaminophen Take  1 tablet (500 mg total) by mouth every 6 (six) hours as needed for mild pain or moderate pain.   aspirin EC 81 MG tablet Take 81 mg by mouth daily.   carvedilol 3.125 MG tablet Commonly known as: COREG Take 1 tablet (3.125 mg total) by mouth 2 (two) times daily with a meal.   cholecalciferol 25 MCG (1000 UNIT) tablet Commonly known as: VITAMIN D3 Take 1,000 Units by mouth daily.   ezetimibe 10 MG tablet Commonly known as: ZETIA Take 10 mg by mouth daily.   oxybutynin 10 MG 24 hr tablet Commonly known as: Ditropan XL Take 1 tablet (10 mg total) by mouth daily for 60 doses.   pantoprazole 20 MG tablet Commonly known as: PROTONIX Take 20 mg by mouth daily before breakfast.   tamsulosin 0.4 MG Caps capsule Commonly known as: FLOMAX Take 1 capsule (0.4 mg total) by mouth daily after supper.   vitamin B-12 250 MCG tablet Commonly known as: CYANOCOBALAMIN Take 250 mcg by mouth daily.               Discharge Care Instructions  (From admission, onward)           Start     Ordered   05/30/21 0000  No dressing needed        05/30/21 1425            Follow-up Information     [name], MD Follow up in 1 week(s).   Specialty: Internal Medicine Contact information: [place] [address] [phone]                Allergies  Allergen Reactions   Lipitor [Atorvastatin] Other (See Comments)    Hip pain    Consultations: General surgery   Procedures/Studies: CT ABDOMEN PELVIS WO CONTRAST  Result Date: 05/27/2021 CLINICAL DATA:  Bowel obstruction suspected. 2 days N/V/D vomiting several times a day as well as diarrhea. Pt drank 60 minutes prior EXAM: CT ABDOMEN AND PELVIS WITHOUT CONTRAST TECHNIQUE: Multidetector CT imaging of the abdomen and pelvis was performed following the standard protocol without IV contrast. COMPARISON:  CT renal 05/11/2021 FINDINGS: Lower chest: Elevated right hemidiaphragm with atelectasis of the right  base. Scarring versus atelectasis of bilateral lower lobes. Trace right pleural effusion with a ventricular shunt terminating within the right pleural space. Left anterior descending coronary calcification. Hepatobiliary: No focal liver abnormality. Calcified gallstone noted within the gallbladder lumen. No gallbladder wall thickening or pericholecystic fluid. No biliary dilatation. Pancreas: Diffusely atrophic. No focal lesion. Otherwise normal pancreatic contour. No surrounding inflammatory changes. No main pancreatic ductal dilatation. Spleen: Normal in size without focal abnormality. Adrenals/Urinary Tract: No adrenal nodule bilaterally. Multiple calcified stones within the right kidney measuring up to 3 mm. No left nephrolithiasis. A left ureteral stent is noted with proximal pigtail terminating in the region of a decompressed left renal pelvis and proximal pigtail terminating within the urinary bladder lumen.No definite contour-deforming renal mass. No ureterolithiasis or hydroureter. The urinary bladder is unremarkable. Stomach/Bowel: Stomach is within normal limits. The proximal and early mid small bowel are dilated with air and fluid measuring up to 4 cm in caliber. No  definite transition point. The mid to distal small bowel is however decompressed. No evidence of large bowel wall thickening or dilatation. Diffuse sigmoid and otherwise scattered colonic diverticulosis. The appendix is not definitely identified with no inflammatory changes in the right lower quadrant to suggest acute appendicitis. 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 This extends approximately 3-4 cm in the craniocaudal dimension. Mild atherosclerotic plaque of the aorta and its branches. No abdominal, pelvic, or inguinal lymphadenopathy. Reproductive: Prostate radiation beads noted. Other: No intraperitoneal free fluid. No intraperitoneal free gas. No  organized fluid collection. Musculoskeletal: No abdominal wall hernia or abnormality. No suspicious lytic or blastic osseous lesions. No acute displaced fracture. Multilevel degenerative changes of the spine. Stable grade 1 L4 on L5 anterolisthesis in a patient status post posterolateral fusion interbody fusion at this level. IMPRESSION: 1. Findings suggestive an early or partial small bowel obstruction. No definite transition point. Differential diagnosis includes ileus. No bowel perforation. 2. Colonic diverticulosis with no acute diverticulitis. 3. Nonobstructive right nephrolithiasis measuring up to 3 mm. 4. Left ureteral stent in a decompressed left collecting system in likely appropriate position. Limited evaluation on this noncontrast study. 5. Cholelithiasis with no acute cholecystitis. 6. 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 Recommend annual imaging followup by CTA or MRA. This recommendation follows 2010 ACCF/AHA/AATS/ACR/ASA/SCA/SCAI/SIR/STS/SVM Guidelines for the Diagnosis and Management of Patients with Thoracic Aortic Disease. Circulation. 2010; 121: I097-D532. 7. Aortic aneurysm NOS (ICD10-I71.9). Aortic Atherosclerosis (ICD10-I70.0). Electronically Signed   By: Iven Finn M.D.   On: 05/27/2021 16:13   DG Abd 1 View  Result Date: 05/28/2021 CLINICAL DATA:  Small bowel obstruction. EXAM: ABDOMEN - 1 VIEW COMPARISON:  CT examination dated May 27, 2021 FINDINGS: NG tube projecting over the gastric body. Right-sided drain is again noted. Multiple dilated bowel loops measuring up to 4.9 cm concerning for ileus/obstruction. Left ureteral stent is unchanged. Posterior spinal fusion at L5-S1. No acute osseous abnormality. IMPRESSION: Multiple dilated small bowel loops measuring up to 4.9 cm concerning for bowel obstruction/ileus. Electronically Signed   By: Keane Police D.O.   On: 05/28/2021 09:04   NM Pulmonary Perfusion  Result Date: 05/11/2021 CLINICAL DATA:   Hypoxia, flank pain, history prostate cancer post radiation therapy, recent UTI with hematuria, chronic cough unchanged, hypertension EXAM: NUCLEAR MEDICINE PERFUSION LUNG SCAN TECHNIQUE: Perfusion images were obtained in multiple projections after intravenous injection of radiopharmaceutical. Ventilation scans intentionally deferred if perfusion scan and chest x-ray adequate for interpretation during COVID 19 epidemic. RADIOPHARMACEUTICALS:  3.95 mCi Tc-66m MAA IV COMPARISON:  Chest radiograph but 05/11/2021 FINDINGS: Minimal irregularity of perfusion at the posterior bases of the lower lobes corresponding minimal basilar atelectasis. No segmental or subsegmental perfusion defects otherwise identified. Elevation of RIGHT diaphragm noted. IMPRESSION: Minimal bibasilar atelectasis. No scintigraphic evidence of pulmonary embolism. Electronically Signed   By: Lavonia Dana M.D.   On: 05/11/2021 14:28   DG Retrograde Pyelogram  Result Date: 05/13/2021 CLINICAL DATA:  Retrograde pyelogram with ureteral stent placement EXAM: RETROGRADE PYELOGRAM FLUOROSCOPY TIME:  34 seconds COMPARISON:  CT abdomen pelvis-05/11/2021 FINDINGS: Two spot intraoperative fluoroscopic images during left-sided retrograde pyelogram and ureteral stent placement are provided for review Initial image demonstrates the superior end of a ureteral stent with faint opacification of several of the left-sided renal calices which appear nondilated Additional image demonstrates the inferior aspect of a ureteral stent with inferior coil overlying expected location of the urinary bladder. Multiple brachytherapy  seeds overlies expected location of the prostate gland. IMPRESSION: Retrograde pyelogram with left-sided ureteral stent placement. Electronically Signed   By: Sandi Mariscal M.D.   On: 05/13/2021 08:04   DG Chest Portable 1 View  Result Date: 05/27/2021 CLINICAL DATA:  Nasogastric tube placement EXAM: PORTABLE CHEST 1 VIEW COMPARISON:  Portable exam  1807 hours compared to 05/12/2021 FINDINGS: Nasogastric tube tip projects over proximal stomach. VP shunt tubing traverses RIGHT hemithorax. Bowel interposition between liver and diaphragm. Enlargement of cardiac silhouette. Mediastinal contours and pulmonary vascularity normal. Atherosclerotic calcification aorta. RIGHT basilar atelectasis without pulmonary infiltrate, pleural effusion, or pneumothorax. IMPRESSION: Nasogastric tube projects over proximal stomach. Enlargement of cardiac silhouette with RIGHT basilar atelectasis. Aortic Atherosclerosis (ICD10-I70.0). Electronically Signed   By: Lavonia Dana M.D.   On: 05/27/2021 18:18   DG Chest Port 1 View  Result Date: 05/12/2021 CLINICAL DATA:  Shortness of breath today EXAM: PORTABLE CHEST 1 VIEW COMPARISON:  Portable exam 0919 hours compared to 05/11/2021 FINDINGS: VP shunt tubing traverses RIGHT chest. Upper normal heart size. Mediastinal contours and pulmonary vascularity normal. Atherosclerotic calcification aorta. Elevation of RIGHT diaphragm with bowel interposition. Decreased lung volumes with bibasilar atelectasis Remaining lungs clear. No acute infiltrate, pleural effusion or pneumothorax. IMPRESSION: Decreased lung volumes with bibasilar atelectasis. Aortic Atherosclerosis (ICD10-I70.0). Electronically Signed   By: Lavonia Dana M.D.   On: 05/12/2021 09:47   DG Chest Portable 1 View  Result Date: 05/11/2021 CLINICAL DATA:  Shortness of breath EXAM: PORTABLE CHEST 1 VIEW COMPARISON:  Chest x-ray dated August 22, 2020 FINDINGS: Cardiac and mediastinal contours are unchanged. Mild bibasilar opacities, likely due to atelectasis. Unchanged elevation of the right hemidiaphragm. No evidence of pneumothorax. Partially visualized shunt catheter tubing with no evidence of discontinuity or kinking. IMPRESSION: Mild bibasilar opacities, likely due to atelectasis. Electronically Signed   By: Yetta Glassman M.D.   On: 05/11/2021 09:54   DG Abd Portable  2V  Result Date: 05/29/2021 CLINICAL DATA:  Follow-up small bowel obstruction. EXAM: PORTABLE ABDOMEN - 2 VIEW COMPARISON:  05/28/2021. FINDINGS: Previously seen small bowel dilation has significantly improved. Mildly dilated small bowel is now noted in the right mid to upper abdomen. Air is seen within the nondistended colon. No free air.  No significant air-fluid levels on the erect view. Nasogastric tube is stable, nasogastric 2 lies within the mid stomach. Left ureteral stent is stable. IMPRESSION: 1. Significant interval improvement a notable decrease small-bowel dilation. Mildly dilated small bowel persists in the right mid to upper abdomen. No significant air-fluid levels on the erect view. Electronically Signed   By: Lajean Manes M.D.   On: 05/29/2021 11:29   CT Renal Stone Study  Result Date: 05/11/2021 CLINICAL DATA:  Flank pain. EXAM: CT ABDOMEN AND PELVIS WITHOUT CONTRAST TECHNIQUE: Multidetector CT imaging of the abdomen and pelvis was performed following the standard protocol without IV contrast. COMPARISON:  None. FINDINGS: Lower chest: Mild atelectasis is seen within the posterior aspects of the bilateral lung bases. Hepatobiliary: No focal liver abnormality is seen. A thin layer of subcentimeter gallstones is seen within the lumen of an otherwise normal-appearing gallbladder. Pancreas: Unremarkable. No pancreatic ductal dilatation or surrounding inflammatory changes. Spleen: Normal in size without focal abnormality. Adrenals/Urinary Tract: Adrenal glands are unremarkable. Kidneys are normal, without focal lesions. Multiple subcentimeter nonobstructing renal stones are seen within the right kidney. An ill-defined 2 mm obstructing renal stone is seen within the distal left ureter, near the left UVJ (axial CT image 84, CT series 3).  Mild left-sided hydronephrosis and hydroureter are present. Bladder is unremarkable. Stomach/Bowel: Stomach is within normal limits. The appendix is not clearly  identified. No evidence of bowel wall thickening, distention, or inflammatory changes. Noninflamed diverticula are seen throughout the descending and sigmoid colon. Vascular/Lymphatic: Aortic atherosclerosis with stable 4.2 cm x 3.3 cm aneurysmal dilatation of the infrarenal abdominal aorta. No enlarged abdominal or pelvic lymph nodes. Reproductive: Multiple prostate radiation implantation seeds are seen. Other: No abdominal wall hernia or abnormality. No abdominopelvic ascites. Musculoskeletal: Bilateral pedicle screws are seen at the levels of L4 and L5. IMPRESSION: 1. Obstructing 2 mm distal left ureteral stone, near the left UVJ. 2. Multiple subcentimeter nonobstructing right renal stones. 3. Cholelithiasis. 4. Colonic diverticulosis. 5. Stable 4.2 cm x 3.3 cm infrarenal abdominal aortic aneurysm. 6. Aortic atherosclerosis. Aortic Atherosclerosis (ICD10-I70.0). Electronically Signed   By: Virgina Norfolk M.D.   On: 05/11/2021 03:12      Subjective: Patient was seen and examined at bedside.  Overnight events noted.  Patient reports feeling better. Patient is able to pass flatus and had a bowel movement.  Cleared from surgery to be discharged.  Discharge Exam: Vitals:   05/29/21 2102 05/30/21 0446  BP: 122/78 139/72  Pulse: 85 94  Resp: 16 18  Temp: 99.8 F (37.7 C) 98.9 F (37.2 C)  SpO2: 92% 90%   Vitals:   05/29/21 0449 05/29/21 1336 05/29/21 2102 05/30/21 0446  BP: 125/70 134/77 122/78 139/72  Pulse: 86 85 85 94  Resp: 18 13 16 18   Temp: 98.2 F (36.8 C) 98.1 F (36.7 C) 99.8 F (37.7 C) 98.9 F (37.2 C)  TempSrc: Oral Oral Oral Oral  SpO2: 92% 93% 92% 90%  Weight:      Height:        General: Pt is alert, awake, not in acute distress Cardiovascular: RRR, S1/S2 +, no rubs, no gallops Respiratory: CTA bilaterally, no wheezing, no rhonchi Abdominal: Soft, NT, ND, bowel sounds + Extremities: no edema, no cyanosis    The results of significant diagnostics from this  hospitalization (including imaging, microbiology, ancillary and laboratory) are listed below for reference.     Microbiology: Recent Results (from the past 240 hour(s))  Resp Panel by RT-PCR (Flu A&B, Covid) Nasopharyngeal Swab     Status: None   Collection Time: 05/27/21  7:13 PM   Specimen: Nasopharyngeal Swab; Nasopharyngeal(NP) swabs in vial transport medium  Result Value Ref Range Status   SARS Coronavirus 2 by RT PCR NEGATIVE NEGATIVE Final    Comment: (NOTE) SARS-CoV-2 target nucleic acids are NOT DETECTED.  The SARS-CoV-2 RNA is generally detectable in upper respiratory specimens during the acute phase of infection. The lowest concentration of SARS-CoV-2 viral copies this assay can detect is 138 copies/mL. A negative result does not preclude SARS-Cov-2 infection and should not be used as the sole basis for treatment or other patient management decisions. A negative result may occur with  improper specimen collection/handling, submission of specimen other than nasopharyngeal swab, presence of viral mutation(s) within the areas targeted by this assay, and inadequate number of viral copies(<138 copies/mL). A negative result must be combined with clinical observations, patient history, and epidemiological information. The expected result is Negative.  Fact Sheet for Patients:  EntrepreneurPulse.com.au  Fact Sheet for Healthcare Providers:  IncredibleEmployment.be  This test is no t yet approved or cleared by the [place] FDA and  has been authorized for detection and/or diagnosis of SARS-CoV-2 by FDA under an Emergency Use Authorization (EUA). This  EUA will remain  in effect (meaning this test can be used) for the duration of the COVID-19 declaration under Section 564(b)(1) of the Act, 21 U.S.C.section 360bbb-3(b)(1), unless the authorization is terminated  or revoked sooner.       Influenza A by PCR NEGATIVE NEGATIVE Final    Influenza B by PCR NEGATIVE NEGATIVE Final    Comment: (NOTE) The Xpert Xpress SARS-CoV-2/FLU/RSV plus assay is intended as an aid in the diagnosis of influenza from Nasopharyngeal swab specimens and should not be used as a sole basis for treatment. Nasal washings and aspirates are unacceptable for Xpert Xpress SARS-CoV-2/FLU/RSV testing.  Fact Sheet for Patients: EntrepreneurPulse.com.au  Fact Sheet for Healthcare Providers: IncredibleEmployment.be  This test is not yet approved or cleared by the [place] FDA and has been authorized for detection and/or diagnosis of SARS-CoV-2 by FDA under an Emergency Use Authorization (EUA). This EUA will remain in effect (meaning this test can be used) for the duration of the COVID-19 declaration under Section 564(b)(1) of the Act, 21 U.S.C. section 360bbb-3(b)(1), unless the authorization is terminated or revoked.  Performed at [facility], [address] [place], [place] [address]      Labs: BNP (last 3 results) Recent Labs    08/22/20 1556 05/11/21 1008 05/13/21 0105  BNP 56.5 41.1 36.1   Basic Metabolic Panel: Recent Labs  Lab 05/27/21 1146 05/28/21 0601 05/30/21 0516  NA 145 142 143  K 3.9 3.2* 3.4*  CL 100 100 104  CO2 32 35* 32  GLUCOSE 209* 139* 158*  BUN 37* 45* 30*  CREATININE 1.80* 1.97* 1.35*  CALCIUM 9.4 8.2* 8.2*  MG  --  2.0 1.8  PHOS  --   --  2.8   Liver Function Tests: Recent Labs  Lab 05/27/21 1146 05/28/21 0601  AST 24 20  ALT 18 19  ALKPHOS 64 49  BILITOT 1.1 1.2  PROT 7.3 6.0*  ALBUMIN 4.2 3.3*   Recent Labs  Lab 05/27/21 1146  LIPASE 10*   No results for input(s): AMMONIA in the last 168 hours. CBC: Recent Labs  Lab 05/27/21 1146 05/28/21 0601 05/30/21 0516  WBC 9.2 6.4 7.4  NEUTROABS  --  4.7  --   HGB 16.3 14.3 13.1  HCT 50.9 46.0 42.0  MCV 97.0 100.7* 102.2*  PLT 211 167 130*   Cardiac Enzymes: No results for  input(s): CKTOTAL, CKMB, CKMBINDEX, TROPONINI in the last 168 hours. BNP: Invalid input(s): POCBNP CBG: Recent Labs  Lab 05/29/21 2100 05/29/21 2355 05/30/21 0449 05/30/21 0818 05/30/21 1133  GLUCAP 154* 149* 155* 132* 157*   D-Dimer No results for input(s): DDIMER in the last 72 hours. Hgb A1c Recent Labs    05/28/21 0601  HGBA1C 7.2*   Lipid Profile No results for input(s): CHOL, HDL, LDLCALC, TRIG, CHOLHDL, LDLDIRECT in the last 72 hours. Thyroid function studies No results for input(s): TSH, T4TOTAL, T3FREE, THYROIDAB in the last 72 hours.  Invalid input(s): FREET3 Anemia work up No results for input(s): VITAMINB12, FOLATE, FERRITIN, TIBC, IRON, RETICCTPCT in the last 72 hours. Urinalysis    Component Value Date/Time   COLORURINE AMBER (A) 05/27/2021 2104   APPEARANCEUR CLOUDY (A) 05/27/2021 2104   LABSPEC 1.023 05/27/2021 2104   PHURINE 5.0 05/27/2021 2104   GLUCOSEU NEGATIVE 05/27/2021 2104   HGBUR LARGE (A) 05/27/2021 2104   BILIRUBINUR NEGATIVE 05/27/2021 2104   [place] NEGATIVE 05/27/2021 2104   PROTEINUR 100 (A) 05/27/2021 2104   UROBILINOGEN 1.0 08/25/2008 0326  NITRITE NEGATIVE 05/27/2021 2104   LEUKOCYTESUR NEGATIVE 05/27/2021 2104   Sepsis Labs Invalid input(s): PROCALCITONIN,  WBC,  LACTICIDVEN Microbiology Recent Results (from the past 240 hour(s))  Resp Panel by RT-PCR (Flu A&B, Covid) Nasopharyngeal Swab     Status: None   Collection Time: 05/27/21  7:13 PM   Specimen: Nasopharyngeal Swab; Nasopharyngeal(NP) swabs in vial transport medium  Result Value Ref Range Status   SARS Coronavirus 2 by RT PCR NEGATIVE NEGATIVE Final    Comment: (NOTE) SARS-CoV-2 target nucleic acids are NOT DETECTED.  The SARS-CoV-2 RNA is generally detectable in upper respiratory specimens during the acute phase of infection. The lowest concentration of SARS-CoV-2 viral copies this assay can detect is 138 copies/mL. A negative result does not preclude  SARS-Cov-2 infection and should not be used as the sole basis for treatment or other patient management decisions. A negative result may occur with  improper specimen collection/handling, submission of specimen other than nasopharyngeal swab, presence of viral mutation(s) within the areas targeted by this assay, and inadequate number of viral copies(<138 copies/mL). A negative result must be combined with clinical observations, patient history, and epidemiological information. The expected result is Negative.  Fact Sheet for Patients:  EntrepreneurPulse.com.au  Fact Sheet for Healthcare Providers:  IncredibleEmployment.be  This test is no t yet approved or cleared by the [place] FDA and  has been authorized for detection and/or diagnosis of SARS-CoV-2 by FDA under an Emergency Use Authorization (EUA). This EUA will remain  in effect (meaning this test can be used) for the duration of the COVID-19 declaration under Section 564(b)(1) of the Act, 21 U.S.C.section 360bbb-3(b)(1), unless the authorization is terminated  or revoked sooner.       Influenza A by PCR NEGATIVE NEGATIVE Final   Influenza B by PCR NEGATIVE NEGATIVE Final    Comment: (NOTE) The Xpert Xpress SARS-CoV-2/FLU/RSV plus assay is intended as an aid in the diagnosis of influenza from Nasopharyngeal swab specimens and should not be used as a sole basis for treatment. Nasal washings and aspirates are unacceptable for Xpert Xpress SARS-CoV-2/FLU/RSV testing.  Fact Sheet for Patients: EntrepreneurPulse.com.au  Fact Sheet for Healthcare Providers: IncredibleEmployment.be  This test is not yet approved or cleared by the [place] FDA and has been authorized for detection and/or diagnosis of SARS-CoV-2 by FDA under an Emergency Use Authorization (EUA). This EUA will remain in effect (meaning this test can be used) for the duration of  the COVID-19 declaration under Section 564(b)(1) of the Act, 21 U.S.C. section 360bbb-3(b)(1), unless the authorization is terminated or revoked.  Performed at [facility], [address], [place], [place] [address]      Time coordinating discharge: Over 30 minutes  SIGNED:   Shawna Clamp, MD  [facility] 05/30/2021, 2:49 PM Pager   If 7PM-7AM, please contact night-coverage

## 2021-05-30 NOTE — Plan of Care (Signed)
Problem: Activity: Goal: Risk for activity intolerance will decrease Outcome: Completed/Met   Problem: Nutrition: Goal: Adequate nutrition will be maintained Outcome: Completed/Met   Problem: Elimination: Goal: Will not experience complications related to bowel motility Outcome: Completed/Met Goal: Will not experience complications related to urinary retention Outcome: Completed/Met   Problem: Pain Managment: Goal: General experience of comfort will improve Outcome: Completed/Met   Problem: Safety: Goal: Ability to remain free from injury will improve Outcome: Completed/Met

## 2021-05-30 NOTE — Care Management Important Message (Signed)
Important Message  Patient Details IM Letter given to the Patient. Name: Jeremiah [name] MRN: [id number] Date of Birth: [birth year]   Medicare Important Message Given:  Yes     Kerin Salen 05/30/2021, 1:02 PM

## 2021-05-30 NOTE — Progress Notes (Signed)
Progress Note     Subjective: Has tolerated full liquids. No nausea or emesis. Having bowel function - +flatus, +BM. No abdominal pain or distension  Objective: Vital signs in last 24 hours: Temp:  [98.1 F (36.7 C)-99.8 F (37.7 C)] 98.9 F (37.2 C) (01/09 0446) Pulse Rate:  [85-94] 94 (01/09 0446) Resp:  [13-18] 18 (01/09 0446) BP: (122-139)/(72-78) 139/72 (01/09 0446) SpO2:  [90 %-93 %] 90 % (01/09 0446) Last BM Date: 05/29/21  Intake/Output from previous day: 01/08 0701 - 01/09 0700 In: 1880.5 [P.O.:960; I.V.:920.5] Out: 300 [Urine:300] Intake/Output this shift: Total I/O In: -  Out: 150 [Urine:150]  PE: General: pleasant, male who is laying in bed in NAD HEENT: head is normocephalic, atraumatic.   Mouth is pink and moist Heart: regular, rate, and rhythm.  Palpable radial pulses bilaterally Lungs: CTAB.  Respiratory effort nonlabored Abd: soft, NT, ND, +BS MSK: all 4 extremities are symmetrical with no cyanosis, clubbing, or edema. Skin: warm and dry with no masses, lesions, or rashes Psych: A&Ox3 with an appropriate affect.    Lab Results:  Recent Labs    05/28/21 0601 05/30/21 0516  WBC 6.4 7.4  HGB 14.3 13.1  HCT 46.0 42.0  PLT 167 130*   BMET Recent Labs    05/28/21 0601 05/30/21 0516  NA 142 143  K 3.2* 3.4*  CL 100 104  CO2 35* 32  GLUCOSE 139* 158*  BUN 45* 30*  CREATININE 1.97* 1.35*  CALCIUM 8.2* 8.2*   PT/INR No results for input(s): LABPROT, INR in the last 72 hours. CMP     Component Value Date/Time   NA 143 05/30/2021 0516   K 3.4 (L) 05/30/2021 0516   CL 104 05/30/2021 0516   CO2 32 05/30/2021 0516   GLUCOSE 158 (H) 05/30/2021 0516   BUN 30 (H) 05/30/2021 0516   CREATININE 1.35 (H) 05/30/2021 0516   CREATININE 1.24 (H) 04/09/2018 0947   CALCIUM 8.2 (L) 05/30/2021 0516   PROT 6.0 (L) 05/28/2021 0601   ALBUMIN 3.3 (L) 05/28/2021 0601   AST 20 05/28/2021 0601   ALT 19 05/28/2021 0601   ALKPHOS 49 05/28/2021 0601    BILITOT 1.2 05/28/2021 0601   GFRNONAA 53 (L) 05/30/2021 0516   GFRAA >60 07/13/2019 0622   Lipase     Component Value Date/Time   LIPASE 10 (L) 05/27/2021 1146       Studies/Results: DG Abd Portable 2V  Result Date: 05/29/2021 CLINICAL DATA:  Follow-up small bowel obstruction. EXAM: PORTABLE ABDOMEN - 2 VIEW COMPARISON:  05/28/2021. FINDINGS: Previously seen small bowel dilation has significantly improved. Mildly dilated small bowel is now noted in the right mid to upper abdomen. Air is seen within the nondistended colon. No free air.  No significant air-fluid levels on the erect view. Nasogastric tube is stable, nasogastric 2 lies within the mid stomach. Left ureteral stent is stable. IMPRESSION: 1. Significant interval improvement a notable decrease small-bowel dilation. Mildly dilated small bowel persists in the right mid to upper abdomen. No significant air-fluid levels on the erect view. Electronically Signed   By: Lajean Manes M.D.   On: 05/29/2021 11:29    Anti-infectives: Anti-infectives (From admission, onward)    None        Assessment/Plan  Partial SBO vs. Ileus seen on CT scan - clinically he is not obstructed - multiple Bms - tolerating FLD - discontinue NGT and start on soft diet  Stable for discharge as early as this afternoon from  surgical perspective  Low Medical Decision Making   LOS: 3 days   Winferd Humphrey, [facility] 05/30/2021, 9:41 AM Please see Amion for pager number during day hours 7:00am-4:30pm

## 2021-06-03 DIAGNOSIS — E1151 Type 2 diabetes mellitus with diabetic peripheral angiopathy without gangrene: Secondary | ICD-10-CM | POA: Diagnosis not present

## 2021-06-03 DIAGNOSIS — E785 Hyperlipidemia, unspecified: Secondary | ICD-10-CM | POA: Diagnosis not present

## 2021-06-03 DIAGNOSIS — E876 Hypokalemia: Secondary | ICD-10-CM | POA: Diagnosis not present

## 2021-06-03 DIAGNOSIS — G912 (Idiopathic) normal pressure hydrocephalus: Secondary | ICD-10-CM | POA: Diagnosis not present

## 2021-06-06 DIAGNOSIS — I700 Atherosclerosis of aorta: Secondary | ICD-10-CM | POA: Diagnosis not present

## 2021-06-06 DIAGNOSIS — N200 Calculus of kidney: Secondary | ICD-10-CM | POA: Diagnosis not present

## 2021-06-06 DIAGNOSIS — K8020 Calculus of gallbladder without cholecystitis without obstruction: Secondary | ICD-10-CM | POA: Diagnosis not present

## 2021-06-06 DIAGNOSIS — C61 Malignant neoplasm of prostate: Secondary | ICD-10-CM | POA: Diagnosis not present

## 2021-06-06 DIAGNOSIS — N201 Calculus of ureter: Secondary | ICD-10-CM | POA: Diagnosis not present

## 2021-06-08 NOTE — Progress Notes (Signed)
CKD Stage 4

## 2021-06-16 DIAGNOSIS — N201 Calculus of ureter: Secondary | ICD-10-CM | POA: Diagnosis not present

## 2021-07-14 DIAGNOSIS — J9601 Acute respiratory failure with hypoxia: Secondary | ICD-10-CM | POA: Diagnosis not present

## 2021-08-09 DIAGNOSIS — N202 Calculus of kidney with calculus of ureter: Secondary | ICD-10-CM | POA: Diagnosis not present

## 2021-08-09 DIAGNOSIS — C61 Malignant neoplasm of prostate: Secondary | ICD-10-CM | POA: Diagnosis not present

## 2021-08-11 DIAGNOSIS — J9601 Acute respiratory failure with hypoxia: Secondary | ICD-10-CM | POA: Diagnosis not present

## 2021-08-12 ENCOUNTER — Emergency Department (HOSPITAL_COMMUNITY): Payer: Medicare Other

## 2021-08-12 ENCOUNTER — Other Ambulatory Visit: Payer: Self-pay

## 2021-08-12 ENCOUNTER — Encounter (HOSPITAL_COMMUNITY): Payer: Self-pay | Admitting: Emergency Medicine

## 2021-08-12 ENCOUNTER — Emergency Department (HOSPITAL_COMMUNITY)
Admission: EM | Admit: 2021-08-12 | Discharge: 2021-08-13 | Disposition: A | Discharge status: Home / Self Care | Payer: Medicare Other | Attending: Emergency Medicine | Admitting: Emergency Medicine

## 2021-08-12 DIAGNOSIS — Z79899 Other long term (current) drug therapy: Secondary | ICD-10-CM | POA: Insufficient documentation

## 2021-08-12 DIAGNOSIS — Z85828 Personal history of other malignant neoplasm of skin: Secondary | ICD-10-CM | POA: Diagnosis not present

## 2021-08-12 DIAGNOSIS — E119 Type 2 diabetes mellitus without complications: Secondary | ICD-10-CM | POA: Insufficient documentation

## 2021-08-12 DIAGNOSIS — R42 Dizziness and giddiness: Secondary | ICD-10-CM | POA: Diagnosis not present

## 2021-08-12 DIAGNOSIS — R0689 Other abnormalities of breathing: Secondary | ICD-10-CM | POA: Diagnosis not present

## 2021-08-12 DIAGNOSIS — Z20822 Contact with and (suspected) exposure to covid-19: Secondary | ICD-10-CM | POA: Insufficient documentation

## 2021-08-12 DIAGNOSIS — I10 Essential (primary) hypertension: Secondary | ICD-10-CM | POA: Diagnosis not present

## 2021-08-12 DIAGNOSIS — R0902 Hypoxemia: Secondary | ICD-10-CM | POA: Diagnosis not present

## 2021-08-12 DIAGNOSIS — Z8546 Personal history of malignant neoplasm of prostate: Secondary | ICD-10-CM | POA: Insufficient documentation

## 2021-08-12 DIAGNOSIS — R791 Abnormal coagulation profile: Secondary | ICD-10-CM | POA: Diagnosis not present

## 2021-08-12 DIAGNOSIS — R29818 Other symptoms and signs involving the nervous system: Secondary | ICD-10-CM | POA: Diagnosis not present

## 2021-08-12 DIAGNOSIS — R4020 Unspecified coma: Secondary | ICD-10-CM | POA: Diagnosis not present

## 2021-08-12 DIAGNOSIS — Z87891 Personal history of nicotine dependence: Secondary | ICD-10-CM | POA: Insufficient documentation

## 2021-08-12 DIAGNOSIS — Z7982 Long term (current) use of aspirin: Secondary | ICD-10-CM | POA: Insufficient documentation

## 2021-08-12 LAB — CBC
HCT: 50.5 % (ref 39.0–52.0)
Hemoglobin: 16.1 g/dL (ref 13.0–17.0)
MCH: 31.8 pg (ref 26.0–34.0)
MCHC: 31.9 g/dL (ref 30.0–36.0)
MCV: 99.6 fL (ref 80.0–100.0)
Platelets: 204 10*3/uL (ref 150–400)
RBC: 5.07 MIL/uL (ref 4.22–5.81)
RDW: 13.2 % (ref 11.5–15.5)
WBC: 11.1 10*3/uL — ABNORMAL HIGH (ref 4.0–10.5)
nRBC: 0 % (ref 0.0–0.2)

## 2021-08-12 LAB — DIFFERENTIAL
Abs Immature Granulocytes: 0.12 10*3/uL — ABNORMAL HIGH (ref 0.00–0.07)
Basophils Absolute: 0.1 10*3/uL (ref 0.0–0.1)
Basophils Relative: 1 %
Eosinophils Absolute: 0.1 10*3/uL (ref 0.0–0.5)
Eosinophils Relative: 1 %
Immature Granulocytes: 1 %
Lymphocytes Relative: 7 %
Lymphs Abs: 0.8 10*3/uL (ref 0.7–4.0)
Monocytes Absolute: 0.4 10*3/uL (ref 0.1–1.0)
Monocytes Relative: 3 %
Neutro Abs: 9.7 10*3/uL — ABNORMAL HIGH (ref 1.7–7.7)
Neutrophils Relative %: 87 %

## 2021-08-12 LAB — COMPREHENSIVE METABOLIC PANEL
ALT: 10 U/L (ref 0–44)
AST: 15 U/L (ref 15–41)
Albumin: 3.6 g/dL (ref 3.5–5.0)
Alkaline Phosphatase: 57 U/L (ref 38–126)
Anion gap: 7 (ref 5–15)
BUN: 24 mg/dL — ABNORMAL HIGH (ref 8–23)
CO2: 31 mmol/L (ref 22–32)
Calcium: 9 mg/dL (ref 8.9–10.3)
Chloride: 102 mmol/L (ref 98–111)
Creatinine, Ser: 1.63 mg/dL — ABNORMAL HIGH (ref 0.61–1.24)
GFR, Estimated: 42 mL/min — ABNORMAL LOW (ref >60)
Glucose, Bld: 190 mg/dL — ABNORMAL HIGH (ref 70–99)
Potassium: 3.8 mmol/L (ref 3.5–5.1)
Sodium: 140 mmol/L (ref 135–145)
Total Bilirubin: 0.4 mg/dL (ref 0.3–1.2)
Total Protein: 6.7 g/dL (ref 6.5–8.1)

## 2021-08-12 LAB — URINALYSIS, ROUTINE W REFLEX MICROSCOPIC
Bacteria, UA: NONE SEEN
Bilirubin Urine: NEGATIVE
Glucose, UA: 50 mg/dL — AB
Hgb urine dipstick: NEGATIVE
Ketones, ur: NEGATIVE mg/dL
Leukocytes,Ua: NEGATIVE
Nitrite: NEGATIVE
Protein, ur: 30 mg/dL — AB
Specific Gravity, Urine: 1.02 (ref 1.005–1.030)
pH: 6 (ref 5.0–8.0)

## 2021-08-12 LAB — RESP PANEL BY RT-PCR (FLU A&B, COVID) ARPGX2
Influenza A by PCR: NEGATIVE
Influenza B by PCR: NEGATIVE
SARS Coronavirus 2 by RT PCR: NEGATIVE

## 2021-08-12 LAB — APTT: aPTT: 25 seconds (ref 24–36)

## 2021-08-12 LAB — LIPASE, BLOOD: Lipase: 41 U/L (ref 11–51)

## 2021-08-12 LAB — PROTIME-INR
INR: 0.9 (ref 0.8–1.2)
Prothrombin Time: 12.6 seconds (ref 11.4–15.2)

## 2021-08-12 LAB — CBG MONITORING, ED: Glucose-Capillary: 177 mg/dL — ABNORMAL HIGH (ref 70–99)

## 2021-08-12 MED ORDER — MECLIZINE HCL 25 MG PO TABS
25.0000 mg | ORAL_TABLET | Freq: Once | ORAL | Status: DC
  Filled 2021-08-12 (×2): qty 1

## 2021-08-12 NOTE — ED Provider Triage Note (Signed)
Emergency Medicine Provider Triage Evaluation Note ? ?Jeremiah [name] , a 81 y.o. male  was evaluated in triage.  Pt complains of dizziness, n/v.  Last known well was 4 PM.  Patient endorses a sudden onset of nausea and vomiting with accompanying dizziness.  He says the dizziness feels like room spinning.  It is present anytime he opens his eyes.  He feels like he is off balance when he is walking.  He denies any visual deficits, speech changes, headache, numbness, weakness.  He denies any chest pain or shortness of breath, abdominal pain, diarrhea, or constipation.  He denies any fevers or chills. ? ?Review of Systems  ?Positive: Dizziness, n/v ?Negative:  ? ?Physical Exam  ?BP (!) 173/98   Pulse 82   Temp 99 ?F (37.2 ?C) (Oral)   Resp 20   Ht '5\' 10"'$  (1.778 m)   Wt 85.2 kg   SpO2 99%   BMI 26.95 kg/m?  ?Gen:   Awake, no distress   ?Resp:  Normal effort  ?MSK:   Moves extremities without difficulty  ?Other:   ?Alert and Oriented x 3 ?Speech clear with no aphasia ?Cranial Nerve testing ?- Visual Fields grossly intact ?- PERRLA. EOM intact. No vertical Nystagmus ?- Facial Sensation grossly intact ?- No facial asymmetry ?- Uvula and Tongue Midline ?- Accessory Muscles intact ?Motor: ?- 5/5 motor strength in all four extremities.  ?Sensation: ?- Grossly intact in all four extremities.  ?Coordination:  ?- Finger to nose and heel to shin intact bilaterally ? ? ? ?Medical Decision Making  ?Medically screening exam initiated at 8:57 PM.  Appropriate orders placed.  Jeremiah [name] was informed that the remainder of the evaluation will be completed by another provider, this initial triage assessment does not replace that evaluation, and the importance of remaining in the ED until their evaluation is complete. ? 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  I will get his head CT to review for hydrocephalus. ?Other concern is posterior circulating stroke.  He is out of the stroke  window with no other deficits other than the vertiginous symptoms.  Do not think we need to call code stroke for this at this point.  Will obtain head CT and consideration for follow-up with MRI and MRI of brain and neck. ? ? ?  ?Adolphus Birchwood, PA-C ?08/12/21 2101 ? ?

## 2021-08-12 NOTE — ED Triage Notes (Signed)
Pt arrive by EMS from home for c/o dizziness, increase fatigue and vomiting. Last seen well at 4 pm today. Pt is AO x 4 no neuro deficit per EMS. Pt has a shunt on his head. ?

## 2021-08-12 NOTE — ED Notes (Signed)
Pt is not answering for sort  ?

## 2021-08-13 ENCOUNTER — Emergency Department (HOSPITAL_COMMUNITY): Payer: Medicare Other

## 2021-08-13 DIAGNOSIS — R0902 Hypoxemia: Secondary | ICD-10-CM | POA: Diagnosis not present

## 2021-08-13 DIAGNOSIS — R42 Dizziness and giddiness: Secondary | ICD-10-CM | POA: Diagnosis not present

## 2021-08-13 DIAGNOSIS — R29818 Other symptoms and signs involving the nervous system: Secondary | ICD-10-CM | POA: Diagnosis not present

## 2021-08-13 LAB — TROPONIN I (HIGH SENSITIVITY)
Troponin I (High Sensitivity): 6 ng/L (ref <18)
Troponin I (High Sensitivity): 8 ng/L (ref <18)

## 2021-08-13 LAB — BRAIN NATRIURETIC PEPTIDE: B Natriuretic Peptide: 40.1 pg/mL (ref 0.0–100.0)

## 2021-08-13 MED ORDER — MECLIZINE HCL 25 MG PO TABS: 25.0000 mg | ORAL_TABLET | Freq: Three times a day (TID) | ORAL | 0 refills | Status: DC | PRN

## 2021-08-13 NOTE — ED Notes (Signed)
Pt up for dc, family member notified to pick pt up. Pt is on continues O2 and his saturation drops constantly. ED CN [facility] notified and order to place pt on hallway with O2 thank gotten from CN until family member arrive to pick him up. ?

## 2021-08-13 NOTE — ED Notes (Signed)
Pt transported to MRI 

## 2021-08-13 NOTE — ED Provider Notes (Signed)
? ?Emergency Department Provider Note ? ?I have reviewed the triage vital signs and the nursing notes. ? ?HISTORY ? ?Chief Complaint ?Dizziness ? ? ?HPI ?Jeremiah [name] is a 81 y.o. male with vertigo symptoms. Was going to take a nap and got nauseous and dizzy. Improved with keeping eyes closed. Improved just at this moment. Described it as everything spinning. No h/o same. They found him to be hypoxic in the waiting room and started oxygen prior to coming back.  Takes evening and morning BP pill. Didn't take Thursday night med so took two this morning.  ? ?PMH ?Past Medical History:  ?Diagnosis Date  ? BPH (benign prostatic hyperplasia)   ? Central retinal artery occlusion   ? Diabetes mellitus without complication ([place])   ? diet controlled  ? Diverticulosis   ? History of kidney stones   ? Hyperlipidemia   ? Hypertension   ? OSA on CPAP   ? wears cpap  ? Osteoarthritis   ? Prostate cancer ([place]) 01/2014  ? Gleason 7, 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  ? S/P radiation therapy 04/23/13 - 05/29/14  ? Prostate/seminal vesicles, external beam 4500 cGy in 25 sessions  ? Seasonal allergies   ? Skin cancer   ? scalp  ? Wears glasses   ? ? ?Home Medications ?Prior to Admission medications   ?Medication Sig Start Date End Date Taking? Authorizing Provider  ?meclizine (ANTIVERT) 25 MG tablet Take 1 tablet (25 mg total) by mouth 3 (three) times daily as needed for dizziness. 08/13/21  Yes Asuka Dusseau, Corene Cornea, MD  ?acetaminophen (TYLENOL) 500 MG tablet Take 1 tablet (500 mg total) by mouth every 6 (six) hours as needed for mild pain or moderate pain. 05/13/21   Thurnell Lose, MD  ?aspirin EC 81 MG tablet Take 81 mg by mouth daily.     [provider]  ?carvedilol (COREG) 3.125 MG tablet Take 1 tablet (3.125 mg total) by mouth 2 (two) times daily with a meal. 05/13/21   Thurnell Lose, MD  ?cholecalciferol (VITAMIN D3) 25 MCG (1000 UNIT) tablet Take 1,000 Units by mouth daily.    [provider]  ?ezetimibe (ZETIA) 10 MG tablet Take  10 mg by mouth daily. 05/02/21   [provider]  ?pantoprazole (PROTONIX) 20 MG tablet Take 20 mg by mouth daily before breakfast.  02/19/19   [provider]  ?tamsulosin (FLOMAX) 0.4 MG CAPS capsule Take 1 capsule (0.4 mg total) by mouth daily after supper. 05/11/21   Deno Etienne, DO  ?vitamin B-12 (CYANOCOBALAMIN) 250 MCG tablet Take 250 mcg by mouth daily.    [provider]  ? ? ?Social History ?Social History  ? ?Tobacco Use  ? Smoking status: Former  ?  Types: Cigarettes  ?  Quit date: 03/10/1961  ?  Years since quitting: 60.4  ? Smokeless tobacco: Never  ?Vaping Use  ? Vaping Use: Never used  ?Substance Use Topics  ? Alcohol use: Yes  ?  Alcohol/week: 1.0 standard drink  ?  Types: 1 Glasses of wine per week  ?  Comment: rare alcohol  ? Drug use: No  ? ? ?Review of Systems: Documented in HPI ?____________________________________________ ? ?PHYSICAL EXAM: ?VITAL SIGNS: ?ED Triage Vitals  ?Enc Vitals Group  ?   BP 08/12/21 2055 (!) 173/98  ?   Pulse Rate 08/12/21 2055 82  ?   Resp 08/12/21 2055 20  ?   Temp 08/12/21 2055 99 ?F (37.2 ?C)  ?   Temp Source  08/12/21 2055 Oral  ?   SpO2 08/12/21 2055 99 %  ?   Weight 08/12/21 2047 187 lb 13.3 oz (85.2 kg)  ?   Height 08/12/21 2047 '5\' 10"'$  (1.778 m)  ? ?Physical Exam ?Vitals and nursing note reviewed.  ?Constitutional:   ?   Appearance: He is well-developed.  ?HENT:  ?   Head: Normocephalic and atraumatic.  ?Cardiovascular:  ?   Rate and Rhythm: Normal rate.  ?Pulmonary:  ?   Effort: Pulmonary effort is normal. No respiratory distress.  ?Abdominal:  ?   General: There is no distension.  ?Musculoskeletal:     ?   General: Normal range of motion.  ?   Cervical back: Normal range of motion.  ?Neurological:  ?   Mental Status: He is alert.  ?  ? ? ?____________________________________________ ?  ?LABS ?(all labs ordered are listed, but only abnormal results are displayed) ? ?Labs Reviewed  ?CBC - Abnormal; Notable for the following components:  ?     Result Value  ? WBC 11.1 (*)   ? All other components within normal limits  ?DIFFERENTIAL - Abnormal; Notable for the following components:  ? Neutro Abs 9.7 (*)   ? Abs Immature Granulocytes 0.12 (*)   ? All other components within normal limits  ?COMPREHENSIVE METABOLIC PANEL - Abnormal; Notable for the following components:  ? Glucose, Bld 190 (*)   ? BUN 24 (*)   ? Creatinine, Ser 1.63 (*)   ? GFR, Estimated 42 (*)   ? All other components within normal limits  ?URINALYSIS, ROUTINE W REFLEX MICROSCOPIC - Abnormal; Notable for the following components:  ? Glucose, UA 50 (*)   ? Protein, ur 30 (*)   ? All other components within normal limits  ?CBG MONITORING, ED - Abnormal; Notable for the following components:  ? Glucose-Capillary 177 (*)   ? All other components within normal limits  ?RESP PANEL BY RT-PCR (FLU A&B, COVID) [id number]  ?PROTIME-INR  ?APTT  ?LIPASE, BLOOD  ?BRAIN NATRIURETIC PEPTIDE  ?TROPONIN I (HIGH SENSITIVITY)  ?TROPONIN I (HIGH SENSITIVITY)  ? ?____________________________________________ ? ?EKG ? ? EKG Interpretation ? ?Date/Time:  Friday August 12 2021 20:54:19 EDT ?Ventricular Rate:  82 ?PR Interval:  182 ?QRS Duration: 114 ?QT Interval:  398 ?QTC Calculation: 464 ?R Axis:   39 ?Text Interpretation: Normal sinus rhythm Low voltage QRS Incomplete right bundle branch block Nonspecific ST abnormality Abnormal ECG When compared with ECG of 22-Aug-2020 15:31, PREVIOUS ECG IS PRESENT Confirmed by Merrily Pew [phone]) on 08/13/2021 12:32:05 AM ?  ? ?  ? ? ?____________________________________________ ? ?RADIOLOGY ? ?CT Head Wo Contrast ? ?Result Date: 08/12/2021 ?CLINICAL DATA:  Hydrocephalus, shunted, follow up VP shunt, n/v and dizziness EXAM: CT HEAD WITHOUT CONTRAST TECHNIQUE: Contiguous axial images were obtained from the base of the skull through the vertex without intravenous contrast. RADIATION DOSE REDUCTION: This exam was performed according to the departmental dose-optimization program  which includes automated exposure control, adjustment of the mA and/or kV according to patient size and/or use of iterative reconstruction technique. COMPARISON:  Head CT 08/05/2020 07/04/2018 FINDINGS: Brain: There are bilateral subdural collections measuring up to 1.1 cm on the right and 0.4 cm on left. These are similar appearance to prior head CT in March 2022 and are of intermediate density. No hyperdense foci to suggest an acute component. There is mild mass effect on the underlying cortex without significant midline shift. Right parietal approach ventriculostomy catheter tip terminates in the left  lateral ventricle, unchanged from prior exam.Stable size of the ventricular system. Vascular: No hyperdense vessel.  Vascular calcifications. Skull: Negative for skull fracture. Sinuses/Orbits: Mucous retention cyst in right posterior ethmoid air cell. Pelvis are unremarkable. Other: None. IMPRESSION: Chronic bilateral subdural collections, similar in appearance to prior head CT in March 2022. Mild mass effect on the underlying cortex without significant midline shift. Stable position of right parietal approach ventriculostomy catheter. Unchanged size of the ventricular system. Electronically Signed   By: Maurine Simmering M.D.   On: 08/12/2021 22:00  ? ?MR BRAIN WO CONTRAST ? ?Result Date: 08/13/2021 ?CLINICAL DATA:  Acute neurologic deficit.  Dizziness. EXAM: MRI HEAD WITHOUT CONTRAST TECHNIQUE: Multiplanar, multiecho pulse sequences of the brain and surrounding structures were obtained without intravenous contrast. COMPARISON:  07/13/2019 FINDINGS: Brain: No acute infarct, mass effect or extra-axial collection. Bilateral chronic subdural hematomas measuring up to 5 mm in thickness. There is multifocal hyperintense T2-weighted signal within the white matter. Generalized volume loss without a clear lobar predilection. There is a right parietal approach shunt catheter terminating in the left lateral ventricle. The midline  structures are normal. Vascular: Major flow voids are preserved. Skull and upper cervical spine: Normal calvarium and skull base. Visualized upper cervical spine and soft tissues are normal. Sinuses/Or

## 2021-08-13 NOTE — ED Notes (Signed)
Patient discharge instructions reviewed with the patient. The patient verbalized understanding of instructions. Patient discharged. 

## 2021-08-13 NOTE — ED Notes (Signed)
Assume care from [place], [place]. ?

## 2021-08-13 NOTE — ED Notes (Signed)
Pt able to ambulate on the room with assistance unstable gait at times. Pt denies any dizziness at this time. ?

## 2021-08-13 NOTE — ED Notes (Signed)
SPO2 was 80% on room air, RN notified and pt placed on 2L O2. SPO2 now up to 96%.  ?

## 2021-09-11 DIAGNOSIS — J9601 Acute respiratory failure with hypoxia: Secondary | ICD-10-CM | POA: Diagnosis not present

## 2021-09-23 DIAGNOSIS — G912 (Idiopathic) normal pressure hydrocephalus: Secondary | ICD-10-CM | POA: Diagnosis not present

## 2021-09-23 DIAGNOSIS — E1151 Type 2 diabetes mellitus with diabetic peripheral angiopathy without gangrene: Secondary | ICD-10-CM | POA: Diagnosis not present

## 2021-09-23 DIAGNOSIS — E785 Hyperlipidemia, unspecified: Secondary | ICD-10-CM | POA: Diagnosis not present

## 2021-09-23 DIAGNOSIS — E876 Hypokalemia: Secondary | ICD-10-CM | POA: Diagnosis not present

## 2021-09-23 DIAGNOSIS — E538 Deficiency of other specified B group vitamins: Secondary | ICD-10-CM | POA: Diagnosis not present

## 2021-10-11 DIAGNOSIS — E538 Deficiency of other specified B group vitamins: Secondary | ICD-10-CM | POA: Diagnosis not present

## 2021-11-16 DIAGNOSIS — I10 Essential (primary) hypertension: Secondary | ICD-10-CM | POA: Diagnosis not present

## 2021-11-16 DIAGNOSIS — E785 Hyperlipidemia, unspecified: Secondary | ICD-10-CM | POA: Diagnosis not present

## 2021-11-16 DIAGNOSIS — G4733 Obstructive sleep apnea (adult) (pediatric): Secondary | ICD-10-CM | POA: Diagnosis not present

## 2021-11-16 DIAGNOSIS — E1151 Type 2 diabetes mellitus with diabetic peripheral angiopathy without gangrene: Secondary | ICD-10-CM | POA: Diagnosis not present

## 2021-11-17 DIAGNOSIS — E538 Deficiency of other specified B group vitamins: Secondary | ICD-10-CM | POA: Diagnosis not present

## 2021-11-18 DIAGNOSIS — H6123 Impacted cerumen, bilateral: Secondary | ICD-10-CM | POA: Diagnosis not present

## 2021-11-18 DIAGNOSIS — H938X3 Other specified disorders of ear, bilateral: Secondary | ICD-10-CM | POA: Diagnosis not present

## 2021-12-11 DIAGNOSIS — J9601 Acute respiratory failure with hypoxia: Secondary | ICD-10-CM | POA: Diagnosis not present

## 2021-12-12 DIAGNOSIS — G912 (Idiopathic) normal pressure hydrocephalus: Secondary | ICD-10-CM | POA: Diagnosis not present

## 2021-12-12 DIAGNOSIS — Z982 Presence of cerebrospinal fluid drainage device: Secondary | ICD-10-CM | POA: Diagnosis not present

## 2021-12-12 DIAGNOSIS — I6203 Nontraumatic chronic subdural hemorrhage: Secondary | ICD-10-CM | POA: Diagnosis not present

## 2022-01-11 DIAGNOSIS — E1151 Type 2 diabetes mellitus with diabetic peripheral angiopathy without gangrene: Secondary | ICD-10-CM | POA: Diagnosis not present

## 2022-01-11 DIAGNOSIS — J9601 Acute respiratory failure with hypoxia: Secondary | ICD-10-CM | POA: Diagnosis not present

## 2022-01-11 DIAGNOSIS — G4733 Obstructive sleep apnea (adult) (pediatric): Secondary | ICD-10-CM | POA: Diagnosis not present

## 2022-01-11 DIAGNOSIS — I1310 Hypertensive heart and chronic kidney disease without heart failure, with stage 1 through stage 4 chronic kidney disease, or unspecified chronic kidney disease: Secondary | ICD-10-CM | POA: Diagnosis not present

## 2022-01-11 DIAGNOSIS — N1831 Chronic kidney disease, stage 3a: Secondary | ICD-10-CM | POA: Diagnosis not present

## 2022-01-20 ENCOUNTER — Other Ambulatory Visit: Payer: Self-pay

## 2022-01-20 ENCOUNTER — Inpatient Hospital Stay (HOSPITAL_COMMUNITY)
Admission: EM | Admit: 2022-01-20 | Discharge: 2022-01-25 | DRG: 247 | Disposition: A | Discharge status: Home / Self Care | Payer: Medicare Other | Attending: Student | Admitting: Student

## 2022-01-20 ENCOUNTER — Encounter (HOSPITAL_COMMUNITY): Payer: Self-pay

## 2022-01-20 ENCOUNTER — Emergency Department (HOSPITAL_COMMUNITY): Payer: Medicare Other

## 2022-01-20 DIAGNOSIS — J9611 Chronic respiratory failure with hypoxia: Secondary | ICD-10-CM

## 2022-01-20 DIAGNOSIS — I4510 Unspecified right bundle-branch block: Secondary | ICD-10-CM | POA: Diagnosis not present

## 2022-01-20 DIAGNOSIS — E1159 Type 2 diabetes mellitus with other circulatory complications: Secondary | ICD-10-CM | POA: Diagnosis not present

## 2022-01-20 DIAGNOSIS — Z833 Family history of diabetes mellitus: Secondary | ICD-10-CM | POA: Diagnosis not present

## 2022-01-20 DIAGNOSIS — I252 Old myocardial infarction: Secondary | ICD-10-CM | POA: Diagnosis not present

## 2022-01-20 DIAGNOSIS — Z955 Presence of coronary angioplasty implant and graft: Secondary | ICD-10-CM

## 2022-01-20 DIAGNOSIS — R0789 Other chest pain: Secondary | ICD-10-CM | POA: Diagnosis not present

## 2022-01-20 DIAGNOSIS — D696 Thrombocytopenia, unspecified: Secondary | ICD-10-CM | POA: Diagnosis present

## 2022-01-20 DIAGNOSIS — E1165 Type 2 diabetes mellitus with hyperglycemia: Secondary | ICD-10-CM | POA: Diagnosis present

## 2022-01-20 DIAGNOSIS — Z982 Presence of cerebrospinal fluid drainage device: Secondary | ICD-10-CM

## 2022-01-20 DIAGNOSIS — G9389 Other specified disorders of brain: Secondary | ICD-10-CM | POA: Diagnosis present

## 2022-01-20 DIAGNOSIS — Z9981 Dependence on supplemental oxygen: Secondary | ICD-10-CM | POA: Diagnosis not present

## 2022-01-20 DIAGNOSIS — G4733 Obstructive sleep apnea (adult) (pediatric): Secondary | ICD-10-CM | POA: Diagnosis present

## 2022-01-20 DIAGNOSIS — E785 Hyperlipidemia, unspecified: Secondary | ICD-10-CM | POA: Diagnosis present

## 2022-01-20 DIAGNOSIS — I25118 Atherosclerotic heart disease of native coronary artery with other forms of angina pectoris: Secondary | ICD-10-CM | POA: Diagnosis not present

## 2022-01-20 DIAGNOSIS — F05 Delirium due to known physiological condition: Secondary | ICD-10-CM | POA: Diagnosis not present

## 2022-01-20 DIAGNOSIS — E782 Mixed hyperlipidemia: Secondary | ICD-10-CM | POA: Diagnosis not present

## 2022-01-20 DIAGNOSIS — Z801 Family history of malignant neoplasm of trachea, bronchus and lung: Secondary | ICD-10-CM

## 2022-01-20 DIAGNOSIS — I77810 Thoracic aortic ectasia: Secondary | ICD-10-CM | POA: Diagnosis present

## 2022-01-20 DIAGNOSIS — E669 Obesity, unspecified: Secondary | ICD-10-CM | POA: Diagnosis present

## 2022-01-20 DIAGNOSIS — Z8679 Personal history of other diseases of the circulatory system: Secondary | ICD-10-CM

## 2022-01-20 DIAGNOSIS — Z7984 Long term (current) use of oral hypoglycemic drugs: Secondary | ICD-10-CM | POA: Diagnosis not present

## 2022-01-20 DIAGNOSIS — Z79899 Other long term (current) drug therapy: Secondary | ICD-10-CM

## 2022-01-20 DIAGNOSIS — R079 Chest pain, unspecified: Secondary | ICD-10-CM | POA: Diagnosis not present

## 2022-01-20 DIAGNOSIS — I10 Essential (primary) hypertension: Secondary | ICD-10-CM | POA: Diagnosis present

## 2022-01-20 DIAGNOSIS — N400 Enlarged prostate without lower urinary tract symptoms: Secondary | ICD-10-CM | POA: Diagnosis present

## 2022-01-20 DIAGNOSIS — G912 (Idiopathic) normal pressure hydrocephalus: Secondary | ICD-10-CM | POA: Diagnosis present

## 2022-01-20 DIAGNOSIS — Z6828 Body mass index (BMI) 28.0-28.9, adult: Secondary | ICD-10-CM

## 2022-01-20 DIAGNOSIS — N1832 Chronic kidney disease, stage 3b: Secondary | ICD-10-CM | POA: Diagnosis present

## 2022-01-20 DIAGNOSIS — Z923 Personal history of irradiation: Secondary | ICD-10-CM

## 2022-01-20 DIAGNOSIS — I2510 Atherosclerotic heart disease of native coronary artery without angina pectoris: Secondary | ICD-10-CM | POA: Diagnosis not present

## 2022-01-20 DIAGNOSIS — F0390 Unspecified dementia without behavioral disturbance: Secondary | ICD-10-CM | POA: Diagnosis present

## 2022-01-20 DIAGNOSIS — Z8249 Family history of ischemic heart disease and other diseases of the circulatory system: Secondary | ICD-10-CM

## 2022-01-20 DIAGNOSIS — I129 Hypertensive chronic kidney disease with stage 1 through stage 4 chronic kidney disease, or unspecified chronic kidney disease: Secondary | ICD-10-CM | POA: Diagnosis present

## 2022-01-20 DIAGNOSIS — Z7982 Long term (current) use of aspirin: Secondary | ICD-10-CM

## 2022-01-20 DIAGNOSIS — Z87442 Personal history of urinary calculi: Secondary | ICD-10-CM

## 2022-01-20 DIAGNOSIS — R0902 Hypoxemia: Secondary | ICD-10-CM | POA: Diagnosis not present

## 2022-01-20 DIAGNOSIS — I214 Non-ST elevation (NSTEMI) myocardial infarction: Secondary | ICD-10-CM | POA: Diagnosis not present

## 2022-01-20 DIAGNOSIS — Z85828 Personal history of other malignant neoplasm of skin: Secondary | ICD-10-CM

## 2022-01-20 DIAGNOSIS — Z87891 Personal history of nicotine dependence: Secondary | ICD-10-CM

## 2022-01-20 DIAGNOSIS — E119 Type 2 diabetes mellitus without complications: Secondary | ICD-10-CM

## 2022-01-20 DIAGNOSIS — E1122 Type 2 diabetes mellitus with diabetic chronic kidney disease: Secondary | ICD-10-CM | POA: Diagnosis not present

## 2022-01-20 DIAGNOSIS — Z8546 Personal history of malignant neoplasm of prostate: Secondary | ICD-10-CM

## 2022-01-20 DIAGNOSIS — R410 Disorientation, unspecified: Secondary | ICD-10-CM | POA: Diagnosis not present

## 2022-01-20 DIAGNOSIS — Z888 Allergy status to other drugs, medicaments and biological substances status: Secondary | ICD-10-CM

## 2022-01-20 DIAGNOSIS — R778 Other specified abnormalities of plasma proteins: Secondary | ICD-10-CM | POA: Diagnosis not present

## 2022-01-20 DIAGNOSIS — E1169 Type 2 diabetes mellitus with other specified complication: Secondary | ICD-10-CM | POA: Diagnosis not present

## 2022-01-20 LAB — CBC
HCT: 48.5 % (ref 39.0–52.0)
Hemoglobin: 15.2 g/dL (ref 13.0–17.0)
MCH: 31.3 pg (ref 26.0–34.0)
MCHC: 31.3 g/dL (ref 30.0–36.0)
MCV: 100 fL (ref 80.0–100.0)
Platelets: 166 10*3/uL (ref 150–400)
RBC: 4.85 MIL/uL (ref 4.22–5.81)
RDW: 14.1 % (ref 11.5–15.5)
WBC: 7.2 10*3/uL (ref 4.0–10.5)
nRBC: 0 % (ref 0.0–0.2)

## 2022-01-20 NOTE — ED Provider Notes (Signed)
[facility] EMERGENCY DEPARTMENT Provider Note   CSN: [id number] Arrival date & time: 01/20/22  2140     History {Add pertinent medical, surgical, social history, OB history to HPI:1} Chief Complaint  Patient presents with   Chest Pain    Jeremiah [name] is a 81 y.o. male.  The history is provided by the patient and medical records.  Chest Pain  81 y.o. M with hx of AAA, DM2, HLP, HTN, OSA, presenting to the ED for chest pain.  States it began around 8PM while he was sitting on the couch watching TV.  He states pain in center of his chest without radiation.  He denies SOB, diaphoresis, nausea, vomiting, or feelings of syncope.  He did take 324 ASA which resolved pain.  He denies known cardiac hx.  He is not a smoker.  Sats on arrival to ED 88%-- he does report due to his OSA he has fluctuating hypoxia and has PRN home O2 that he uses.  Home Medications Prior to Admission medications   Medication Sig Start Date End Date Taking? Authorizing Provider  acetaminophen (TYLENOL) 500 MG tablet Take 1 tablet (500 mg total) by mouth every 6 (six) hours as needed for mild pain or moderate pain. 05/13/21   Thurnell Lose, MD  aspirin EC 81 MG tablet Take 81 mg by mouth daily.     [provider]  carvedilol (COREG) 3.125 MG tablet Take 1 tablet (3.125 mg total) by mouth 2 (two) times daily with a meal. 05/13/21   Thurnell Lose, MD  cholecalciferol (VITAMIN D3) 25 MCG (1000 UNIT) tablet Take 1,000 Units by mouth daily.    [provider]  ezetimibe (ZETIA) 10 MG tablet Take 10 mg by mouth daily. 05/02/21   [provider]  meclizine (ANTIVERT) 25 MG tablet Take 1 tablet (25 mg total) by mouth 3 (three) times daily as needed for dizziness. 08/13/21   Mesner, Corene Cornea, MD  pantoprazole (PROTONIX) 20 MG tablet Take 20 mg by mouth daily before breakfast.  02/19/19   [provider]  tamsulosin (FLOMAX) 0.4 MG CAPS capsule Take 1 capsule (0.4 mg  total) by mouth daily after supper. 05/11/21   Deno Etienne, DO  vitamin B-12 (CYANOCOBALAMIN) 250 MCG tablet Take 250 mcg by mouth daily.    [provider]      Allergies    Lipitor [atorvastatin]    Review of Systems   Review of Systems  Cardiovascular:  Positive for chest pain.  All other systems reviewed and are negative.   Physical Exam Updated Vital Signs BP (!) 196/111 (BP Location: Right Arm)   Pulse 84   Temp 99 F (37.2 C) (Oral)   Resp 18   Ht '5\' 10"'$  (1.778 m)   Wt 90.7 kg   SpO2 97%   BMI 28.70 kg/m   Physical Exam Vitals and nursing note reviewed.  Constitutional:      Appearance: He is well-developed.  HENT:     Head: Normocephalic and atraumatic.  Eyes:     Conjunctiva/sclera: Conjunctivae normal.     Pupils: Pupils are equal, round, and reactive to light.  Cardiovascular:     Rate and Rhythm: Normal rate and regular rhythm.     Heart sounds: Normal heart sounds.  Pulmonary:     Effort: Pulmonary effort is normal.     Breath sounds: Normal breath sounds. No wheezing or rhonchi.  Abdominal:     General: Bowel sounds are normal.  Palpations: Abdomen is soft.  Musculoskeletal:        General: Normal range of motion.     Cervical back: Normal range of motion.  Skin:    General: Skin is warm and dry.  Neurological:     Mental Status: He is alert and oriented to person, place, and time.     ED Results / Procedures / Treatments   Labs (all labs ordered are listed, but only abnormal results are displayed) Labs Reviewed  BASIC METABOLIC PANEL  CBC  TROPONIN I (HIGH SENSITIVITY)    EKG EKG Interpretation  Date/Time:  Friday January 20 2022 21:52:52 EDT Ventricular Rate:  85 PR Interval:  171 QRS Duration: 130 QT Interval:  427 QTC Calculation: 508 R Axis:   71 Text Interpretation: Sinus rhythm Right bundle branch block Confirmed by Ronnald Nian, Adam ([id number]) on 01/20/2022 9:54:39 PM  Radiology No results  found.  Procedures Procedures  {Document cardiac monitor, telemetry assessment procedure when appropriate:1}  Medications Ordered in ED Medications - No data to display  ED Course/ Medical Decision Making/ A&P                           Medical Decision Making Amount and/or Complexity of Data Reviewed Labs: ordered. Radiology: ordered.   ***  {Document critical care time when appropriate:1} {Document review of labs and clinical decision tools ie heart score, Chads2Vasc2 etc:1}  {Document your independent review of radiology images, and any outside records:1} {Document your discussion with family members, caretakers, and with consultants:1} {Document social determinants of health affecting pt's care:1} {Document your decision making why or why not admission, treatments were needed:1} Final Clinical Impression(s) / ED Diagnoses Final diagnoses:  None    Rx / DC Orders ED Discharge Orders     None

## 2022-01-20 NOTE — ED Triage Notes (Signed)
PER EMS: pt is from home with c/o sudden onset of non-radiating substernal chest pain tonight at 2000. He took 324 aspirin with relief of CP. Denies SOB, nausea or any other symptoms with his episode of CP. A&Ox4.  Initial RA sats was 88%, placed on 2L [place].Home O2 RA  BP-202/107, HR-84, EKG unremarkable. CBG-190

## 2022-01-21 ENCOUNTER — Observation Stay (HOSPITAL_COMMUNITY): Payer: Medicare Other

## 2022-01-21 ENCOUNTER — Encounter (HOSPITAL_COMMUNITY): Payer: Self-pay | Admitting: Internal Medicine

## 2022-01-21 DIAGNOSIS — E669 Obesity, unspecified: Secondary | ICD-10-CM | POA: Diagnosis present

## 2022-01-21 DIAGNOSIS — R079 Chest pain, unspecified: Secondary | ICD-10-CM

## 2022-01-21 DIAGNOSIS — N1832 Chronic kidney disease, stage 3b: Secondary | ICD-10-CM | POA: Diagnosis present

## 2022-01-21 DIAGNOSIS — F0390 Unspecified dementia without behavioral disturbance: Secondary | ICD-10-CM | POA: Diagnosis present

## 2022-01-21 DIAGNOSIS — J9611 Chronic respiratory failure with hypoxia: Secondary | ICD-10-CM | POA: Diagnosis not present

## 2022-01-21 DIAGNOSIS — G4733 Obstructive sleep apnea (adult) (pediatric): Secondary | ICD-10-CM | POA: Diagnosis present

## 2022-01-21 DIAGNOSIS — I4510 Unspecified right bundle-branch block: Secondary | ICD-10-CM | POA: Diagnosis present

## 2022-01-21 DIAGNOSIS — I214 Non-ST elevation (NSTEMI) myocardial infarction: Secondary | ICD-10-CM

## 2022-01-21 DIAGNOSIS — R0789 Other chest pain: Secondary | ICD-10-CM | POA: Diagnosis present

## 2022-01-21 DIAGNOSIS — Z7984 Long term (current) use of oral hypoglycemic drugs: Secondary | ICD-10-CM | POA: Diagnosis not present

## 2022-01-21 DIAGNOSIS — Z9981 Dependence on supplemental oxygen: Secondary | ICD-10-CM

## 2022-01-21 DIAGNOSIS — F05 Delirium due to known physiological condition: Secondary | ICD-10-CM | POA: Diagnosis not present

## 2022-01-21 DIAGNOSIS — G9389 Other specified disorders of brain: Secondary | ICD-10-CM | POA: Diagnosis present

## 2022-01-21 DIAGNOSIS — N400 Enlarged prostate without lower urinary tract symptoms: Secondary | ICD-10-CM | POA: Diagnosis present

## 2022-01-21 DIAGNOSIS — E1169 Type 2 diabetes mellitus with other specified complication: Secondary | ICD-10-CM

## 2022-01-21 DIAGNOSIS — E1165 Type 2 diabetes mellitus with hyperglycemia: Secondary | ICD-10-CM

## 2022-01-21 DIAGNOSIS — E785 Hyperlipidemia, unspecified: Secondary | ICD-10-CM | POA: Diagnosis not present

## 2022-01-21 DIAGNOSIS — D696 Thrombocytopenia, unspecified: Secondary | ICD-10-CM | POA: Diagnosis present

## 2022-01-21 DIAGNOSIS — I77810 Thoracic aortic ectasia: Secondary | ICD-10-CM | POA: Diagnosis present

## 2022-01-21 DIAGNOSIS — I10 Essential (primary) hypertension: Secondary | ICD-10-CM

## 2022-01-21 DIAGNOSIS — Z79899 Other long term (current) drug therapy: Secondary | ICD-10-CM | POA: Diagnosis not present

## 2022-01-21 DIAGNOSIS — G912 (Idiopathic) normal pressure hydrocephalus: Secondary | ICD-10-CM

## 2022-01-21 DIAGNOSIS — Z982 Presence of cerebrospinal fluid drainage device: Secondary | ICD-10-CM

## 2022-01-21 DIAGNOSIS — I25118 Atherosclerotic heart disease of native coronary artery with other forms of angina pectoris: Secondary | ICD-10-CM | POA: Diagnosis present

## 2022-01-21 DIAGNOSIS — I129 Hypertensive chronic kidney disease with stage 1 through stage 4 chronic kidney disease, or unspecified chronic kidney disease: Secondary | ICD-10-CM | POA: Diagnosis present

## 2022-01-21 DIAGNOSIS — E782 Mixed hyperlipidemia: Secondary | ICD-10-CM | POA: Diagnosis not present

## 2022-01-21 DIAGNOSIS — I2510 Atherosclerotic heart disease of native coronary artery without angina pectoris: Secondary | ICD-10-CM | POA: Diagnosis not present

## 2022-01-21 DIAGNOSIS — Z833 Family history of diabetes mellitus: Secondary | ICD-10-CM | POA: Diagnosis not present

## 2022-01-21 DIAGNOSIS — I252 Old myocardial infarction: Secondary | ICD-10-CM | POA: Diagnosis not present

## 2022-01-21 DIAGNOSIS — E1159 Type 2 diabetes mellitus with other circulatory complications: Secondary | ICD-10-CM | POA: Diagnosis not present

## 2022-01-21 DIAGNOSIS — E1122 Type 2 diabetes mellitus with diabetic chronic kidney disease: Secondary | ICD-10-CM | POA: Diagnosis present

## 2022-01-21 LAB — COMPREHENSIVE METABOLIC PANEL
ALT: 11 U/L (ref 0–44)
AST: 20 U/L (ref 15–41)
Albumin: 3.5 g/dL (ref 3.5–5.0)
Alkaline Phosphatase: 55 U/L (ref 38–126)
Anion gap: 7 (ref 5–15)
BUN: 17 mg/dL (ref 8–23)
CO2: 35 mmol/L — ABNORMAL HIGH (ref 22–32)
Calcium: 9.5 mg/dL (ref 8.9–10.3)
Chloride: 101 mmol/L (ref 98–111)
Creatinine, Ser: 1.13 mg/dL (ref 0.61–1.24)
GFR, Estimated: >60 mL/min (ref >60)
Glucose, Bld: 175 mg/dL — ABNORMAL HIGH (ref 70–99)
Potassium: 3.8 mmol/L (ref 3.5–5.1)
Sodium: 143 mmol/L (ref 135–145)
Total Bilirubin: 0.7 mg/dL (ref 0.3–1.2)
Total Protein: 6.1 g/dL — ABNORMAL LOW (ref 6.5–8.1)

## 2022-01-21 LAB — GLUCOSE, CAPILLARY
Glucose-Capillary: 165 mg/dL — ABNORMAL HIGH (ref 70–99)
Glucose-Capillary: 209 mg/dL — ABNORMAL HIGH (ref 70–99)
Glucose-Capillary: 188 mg/dL — ABNORMAL HIGH (ref 70–99)
Glucose-Capillary: 215 mg/dL — ABNORMAL HIGH (ref 70–99)
Glucose-Capillary: 144 mg/dL — ABNORMAL HIGH (ref 70–99)

## 2022-01-21 LAB — ECHOCARDIOGRAM COMPLETE
AR max vel: 3.04 cm2
AV Area VTI: 3.09 cm2
AV Area mean vel: 3 cm2
AV Mean grad: 1 mmHg
AV Peak grad: 2.7 mmHg
Ao pk vel: 0.83 m/s
Area-P 1/2: 7.44 cm2
Height: 70 in
S' Lateral: 2.7 cm
Weight: 3150.4 oz

## 2022-01-21 LAB — TROPONIN I (HIGH SENSITIVITY)
Troponin I (High Sensitivity): 32 ng/L — ABNORMAL HIGH (ref <18)
Troponin I (High Sensitivity): 114 ng/L (ref <18)
Troponin I (High Sensitivity): 654 ng/L (ref <18)
Troponin I (High Sensitivity): 823 ng/L (ref <18)

## 2022-01-21 LAB — BASIC METABOLIC PANEL
Anion gap: 9 (ref 5–15)
BUN: 22 mg/dL (ref 8–23)
CO2: 32 mmol/L (ref 22–32)
Calcium: 9.1 mg/dL (ref 8.9–10.3)
Chloride: 102 mmol/L (ref 98–111)
Creatinine, Ser: 1.33 mg/dL — ABNORMAL HIGH (ref 0.61–1.24)
GFR, Estimated: 53 mL/min — ABNORMAL LOW (ref >60)
Glucose, Bld: 218 mg/dL — ABNORMAL HIGH (ref 70–99)
Potassium: 4.3 mmol/L (ref 3.5–5.1)
Sodium: 143 mmol/L (ref 135–145)

## 2022-01-21 LAB — LIPID PANEL
Cholesterol: 190 mg/dL (ref 0–200)
HDL: 40 mg/dL — ABNORMAL LOW (ref >40)
LDL Cholesterol: 97 mg/dL (ref 0–99)
Total CHOL/HDL Ratio: 4.8 RATIO
Triglycerides: 264 mg/dL — ABNORMAL HIGH (ref <150)
VLDL: 53 mg/dL — ABNORMAL HIGH (ref 0–40)

## 2022-01-21 LAB — LIPASE, BLOOD: Lipase: 35 U/L (ref 11–51)

## 2022-01-21 LAB — HEPARIN LEVEL (UNFRACTIONATED): Heparin Unfractionated: 0.26 IU/mL — ABNORMAL LOW (ref 0.30–0.70)

## 2022-01-21 LAB — D-DIMER, QUANTITATIVE: D-Dimer, Quant: 0.91 ug/mL-FEU — ABNORMAL HIGH (ref 0.00–0.50)

## 2022-01-21 LAB — HEMOGLOBIN A1C
Hgb A1c MFr Bld: 7.9 % — ABNORMAL HIGH (ref 4.8–5.6)
Mean Plasma Glucose: 180.03 mg/dL

## 2022-01-21 LAB — CBG MONITORING, ED: Glucose-Capillary: 224 mg/dL — ABNORMAL HIGH (ref 70–99)

## 2022-01-21 MED ORDER — ACETAMINOPHEN 650 MG RE SUPP: 650.0000 mg | Freq: Four times a day (QID) | RECTAL | Status: DC | PRN

## 2022-01-21 MED ORDER — INSULIN GLARGINE-YFGN 100 UNIT/ML ~~LOC~~ SOLN
10.0000 [IU] | Freq: Every day | SUBCUTANEOUS | Status: DC
  Administered 2022-01-21 – 2022-01-25 (×5): 10 [IU] via SUBCUTANEOUS
  Filled 2022-01-21 (×6): qty 0.1

## 2022-01-21 MED ORDER — TAMSULOSIN HCL 0.4 MG PO CAPS: 0.4000 mg | ORAL_CAPSULE | Freq: Every day | ORAL | Status: DC

## 2022-01-21 MED ORDER — INSULIN ASPART 100 UNIT/ML IJ SOLN
0.0000 [IU] | Freq: Every day | INTRAMUSCULAR | Status: DC
  Administered 2022-01-21: 2 [IU] via SUBCUTANEOUS

## 2022-01-21 MED ORDER — HEPARIN SODIUM (PORCINE) 5000 UNIT/ML IJ SOLN
5000.0000 [IU] | Freq: Three times a day (TID) | INTRAMUSCULAR | Status: DC
  Administered 2022-01-21 (×2): 5000 [IU] via SUBCUTANEOUS
  Filled 2022-01-21 (×2): qty 1

## 2022-01-21 MED ORDER — ONDANSETRON HCL 4 MG/2ML IJ SOLN: 4.0000 mg | Freq: Four times a day (QID) | INTRAMUSCULAR | Status: DC | PRN

## 2022-01-21 MED ORDER — EZETIMIBE 10 MG PO TABS
10.0000 mg | ORAL_TABLET | Freq: Every day | ORAL | Status: DC
  Administered 2022-01-21 – 2022-01-25 (×5): 10 mg via ORAL
  Filled 2022-01-21 (×5): qty 1

## 2022-01-21 MED ORDER — INSULIN ASPART 100 UNIT/ML IJ SOLN
4.0000 [IU] | Freq: Three times a day (TID) | INTRAMUSCULAR | Status: DC
  Administered 2022-01-21 – 2022-01-25 (×9): 4 [IU] via SUBCUTANEOUS

## 2022-01-21 MED ORDER — HEPARIN BOLUS VIA INFUSION
4000.0000 [IU] | Freq: Once | INTRAVENOUS | Status: AC
  Administered 2022-01-21: 4000 [IU] via INTRAVENOUS
  Filled 2022-01-21: qty 4000

## 2022-01-21 MED ORDER — PANTOPRAZOLE SODIUM 20 MG PO TBEC: 20.0000 mg | DELAYED_RELEASE_TABLET | Freq: Every day | ORAL | Status: DC

## 2022-01-21 MED ORDER — ONDANSETRON HCL 4 MG PO TABS: 4.0000 mg | ORAL_TABLET | Freq: Four times a day (QID) | ORAL | Status: DC | PRN

## 2022-01-21 MED ORDER — INSULIN ASPART 100 UNIT/ML IJ SOLN
0.0000 [IU] | Freq: Three times a day (TID) | INTRAMUSCULAR | Status: DC
  Administered 2022-01-21: 3 [IU] via SUBCUTANEOUS

## 2022-01-21 MED ORDER — ACETAMINOPHEN 325 MG PO TABS: 650.0000 mg | ORAL_TABLET | Freq: Four times a day (QID) | ORAL | Status: DC | PRN

## 2022-01-21 MED ORDER — PANTOPRAZOLE SODIUM 40 MG PO TBEC
40.0000 mg | DELAYED_RELEASE_TABLET | Freq: Every day | ORAL | Status: DC
  Administered 2022-01-21 – 2022-01-25 (×5): 40 mg via ORAL
  Filled 2022-01-21 (×5): qty 1

## 2022-01-21 MED ORDER — ASPIRIN 81 MG PO TBEC
81.0000 mg | DELAYED_RELEASE_TABLET | Freq: Every day | ORAL | Status: DC
  Administered 2022-01-21 – 2022-01-25 (×5): 81 mg via ORAL
  Filled 2022-01-21 (×5): qty 1

## 2022-01-21 MED ORDER — HEPARIN (PORCINE) 25000 UT/250ML-% IV SOLN
1600.0000 [IU]/h | INTRAVENOUS | Status: DC
  Administered 2022-01-21: 1100 [IU]/h via INTRAVENOUS
  Administered 2022-01-22: 1250 [IU]/h via INTRAVENOUS
  Administered 2022-01-23: 1600 [IU]/h via INTRAVENOUS
  Filled 2022-01-21 (×5): qty 250

## 2022-01-21 MED ORDER — INSULIN ASPART 100 UNIT/ML IJ SOLN: 0.0000 [IU] | Freq: Every day | INTRAMUSCULAR | Status: DC

## 2022-01-21 MED ORDER — HYDRALAZINE HCL 20 MG/ML IJ SOLN
5.0000 mg | Freq: Once | INTRAMUSCULAR | Status: AC
  Administered 2022-01-21: 5 mg via INTRAVENOUS
  Filled 2022-01-21: qty 1

## 2022-01-21 MED ORDER — INSULIN ASPART 100 UNIT/ML IJ SOLN
0.0000 [IU] | Freq: Three times a day (TID) | INTRAMUSCULAR | Status: DC
  Administered 2022-01-21: 5 [IU] via SUBCUTANEOUS
  Administered 2022-01-22: 3 [IU] via SUBCUTANEOUS
  Administered 2022-01-22 – 2022-01-23 (×3): 2 [IU] via SUBCUTANEOUS
  Administered 2022-01-23 – 2022-01-24 (×4): 3 [IU] via SUBCUTANEOUS
  Administered 2022-01-25 (×2): 2 [IU] via SUBCUTANEOUS

## 2022-01-21 MED ORDER — CARVEDILOL 3.125 MG PO TABS
3.1250 mg | ORAL_TABLET | Freq: Two times a day (BID) | ORAL | Status: DC
  Administered 2022-01-21: 3.125 mg via ORAL
  Filled 2022-01-21: qty 1

## 2022-01-21 MED ORDER — CARVEDILOL 6.25 MG PO TABS
6.2500 mg | ORAL_TABLET | Freq: Two times a day (BID) | ORAL | Status: DC
  Administered 2022-01-21 (×2): 6.25 mg via ORAL
  Filled 2022-01-21 (×2): qty 1

## 2022-01-21 NOTE — Consult Note (Signed)
Cardiology Consultation   Patient ID: LAVORIS SPARLING MRN: [id number]; DOB: 1941/03/08  Admit date: 01/20/2022 Date of Consult: 01/21/2022  PCP:  [name], [place] Providers Cardiologist:  None        Patient Profile:   Jeremiah [name] is a 81 y.o. 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  History of Present Illness:   Jeremiah [name] is a 81 y.o. 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  Has h/o non obstructive CAD (LHC in 2020- showed minimal 20% disease). He comes in with c/o epigastric pain radiating to his chest but denies any chest pain perse. He reports feeling fatigue and uneasy since last few days. Denies any angina on exertion.  EKG: NSR, incomplete RBBB, no ischemic changes Trop 32 Initial vital signs temp 99 heart rate 84 blood pressure 196/111 with room air saturations 86%.    Cardiac history: ECHO: 2021 IMPRESSIONS     1. Left ventricular ejection fraction, by estimation, is 60 to 65%. The  left ventricle has normal function. The left ventricle has no regional  wall motion abnormalities. There is mildly increased left ventricular  hypertrophy. Left ventricular diastolic  parameters are indeterminate.   2. Right ventricular systolic function is moderately reduced. The right  ventricular size is mildly enlarged. There is mildly elevated pulmonary  artery systolic pressure.   3. Right atrial size was mildly dilated.   4. No pericardial effusion prominent epicardial adipose tissue.   5. The mitral valve is normal in structure and function. No evidence of   mitral valve regurgitation. No evidence of mitral stenosis.   6. The aortic valve is normal in structure and function. Aortic valve  regurgitation is not visualized. No aortic stenosis is present.   7. Aortic dilatation noted. There is mild dilatation of the aortic root  measuring 39 mm.   8. The inferior vena cava is normal in size with greater than 50%  respiratory variability, suggesting right atrial pressure of 3 mmHg.  CATH:02/2019 Prox RCA lesion is 20% stenosed. Dist RCA lesion is 20% stenosed. Mid LAD-1 lesion is 20% stenosed. Mid LAD-2 lesion is 20% stenosed. The left ventricular systolic function is normal. LV end diastolic pressure is normal. There is no mitral valve regurgitation. The left ventricular ejection fraction is 55-65% by visual estimate.   1. Mild non-obstructive CAD 2. Normal LV systolic function   Medical management of mild CAD.   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   S/P radiation therapy 04/23/13 - 05/29/14   Prostate/seminal vesicles, external beam 4500 cGy in 25 sessions   Seasonal allergies    Skin cancer    scalp   Small bowel obstruction ([place]) 06/10/2016  Wears glasses     Past Surgical History:  Procedure Laterality Date   BACK SURGERY  2009   COLONOSCOPY W/ BIOPSIES AND POLYPECTOMY     CYSTOSCOPY W/ URETERAL STENT PLACEMENT Left 05/12/2021   Procedure: CYSTOSCOPY WITH RETROGRADE PYELOGRAM/ LEFT URETERAL STENT PLACEMENT.;  Surgeon: [name], MD;  Location: [facility];  Service: Urology;  Laterality: Left;   [place]   LEFT HEART CATH AND CORONARY ANGIOGRAPHY N/A 03/14/2019   Procedure: LEFT  HEART CATH AND CORONARY ANGIOGRAPHY;  Surgeon: [name], MD;  Location: [facility];  Service: Cardiovascular;  Laterality: N/A;   PROSTATE BIOPSY  2013, 2014, 01/2014   Gleason 7   RADIOACTIVE SEED IMPLANT N/A 07/01/2014   Procedure: RADIOACTIVE SEED IMPLANT;  Surgeon: [name], MD;  Location: [facility];  Service: Urology;  Laterality: N/A;  DR PORTABLE   TONSILLECTOMY     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   Procedure: SHUNT INSERTION VENTRICULAR-PERITONEAL;  Surgeon: [name], MD;  Location: [facility];  Service: Neurosurgery;  Laterality: N/A;  SHUNT INSERTION VENTRICULAR-PERITONEAL     Home Medications:  Prior to Admission medications   Medication Sig Start Date End Date Taking? Authorizing Provider  acetaminophen (TYLENOL) 500 MG tablet Take 1 tablet (500 mg total) by mouth every 6 (six) hours as needed for mild pain or moderate pain. 05/13/21   Thurnell Lose, MD  aspirin EC 81 MG tablet Take 81 mg by mouth daily.     [provider]  carvedilol (COREG) 3.125 MG tablet Take 1 tablet (3.125 mg total) by mouth 2 (two) times daily with a meal. 05/13/21   Thurnell Lose, MD  cholecalciferol (VITAMIN D3) 25 MCG (1000 UNIT) tablet Take 1,000 Units by mouth daily.    [provider]  ezetimibe (ZETIA) 10 MG tablet Take 10 mg by mouth daily. 05/02/21   [provider]  meclizine (ANTIVERT) 25 MG tablet Take 1 tablet (25 mg total) by mouth 3 (three) times daily as needed for dizziness. 08/13/21   Mesner, Corene Cornea, MD  pantoprazole (PROTONIX) 20 MG tablet Take 20 mg by mouth daily before breakfast.  02/19/19   [provider]  tamsulosin (FLOMAX) 0.4 MG CAPS capsule Take 1 capsule (0.4 mg total) by mouth daily after supper. 05/11/21   Deno Etienne, DO  vitamin B-12 (CYANOCOBALAMIN) 250 MCG tablet Take 250 mcg by mouth daily.    [provider]    Inpatient Medications: Scheduled Meds:  aspirin EC  81  mg Oral Daily   carvedilol  3.125 mg Oral BID WC   ezetimibe  10 mg Oral Daily   heparin  5,000 Units Subcutaneous Q8H   insulin aspart  0-5 Units Subcutaneous QHS   insulin aspart  0-9 Units Subcutaneous TID WC   pantoprazole  20 mg Oral QAC breakfast   Continuous Infusions:  PRN Meds: acetaminophen **OR** acetaminophen, ondansetron **OR** ondansetron (ZOFRAN) IV  Allergies:    Allergies  Allergen Reactions   Lipitor [Atorvastatin] Other (See Comments)    Hip pain    Social History:   Social History   Socioeconomic History   Marital status: Married    Spouse name: Not on file   Number of children: 2   Years of education: Not on file   Highest education level: Not on file  Occupational History   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  Tobacco Use   Smoking status: Former  Types: Cigarettes    Quit date: 03/10/1961    Years since quitting: 60.9   Smokeless tobacco: Never  Vaping Use   Vaping Use: Never used  Substance and Sexual Activity   Alcohol use: Yes    Alcohol/week: 1.0 standard drink of alcohol    Types: 1 Glasses of wine per week    Comment: rare alcohol   Drug use: No   Sexual activity: Not on file  Other Topics Concern   Not on file  Social History Narrative   Rare caffeine use    Social Determinants of Health   Financial Resource Strain: Not on file  Food Insecurity: No Food Insecurity (06/13/2018)   Hunger Vital Sign    Worried About Running Out of Food in the Last Year: Never true    Ran Out of Food in the Last Year: Never true  Transportation Needs: No Transportation Needs (06/13/2018)   PRAPARE - Hydrologist (Medical): No    Lack of Transportation (Non-Medical): No  Physical Activity: Unknown (06/13/2018)   Exercise Vital Sign    Days of Exercise per Week: Patient refused    Minutes of Exercise per Session: Patient refused  Stress: No Stress Concern Present (06/13/2018)   [place]    Feeling of Stress : Only a little  Social Connections: Unknown (06/13/2018)   Social Connection and Isolation Panel [NHANES]    Frequency of Communication with Friends and Family: More than three times a week    Frequency of Social Gatherings with Friends and Family: More than three times a week    Attends Religious Services: Not on file    Active Member of Clubs or Organizations: Not on file    Attends Archivist Meetings: Not on file    Marital Status: Not on file  Intimate Partner Violence: Not At Risk (06/13/2018)   Humiliation, Afraid, Rape, and Kick questionnaire    Fear of Current or Ex-Partner: No    Emotionally Abused: No    Physically Abused: No    Sexually Abused: No    Family History:    Family History  Problem Relation Age of Onset   Heart attack Father 71   Cancer Father        prostate   Diabetes Father    Cancer Paternal Uncle        prostate   Dementia Mother 15   Lung cancer Brother      ROS:  Please see the history of present illness.   All other ROS reviewed and negative.     Physical Exam/Data:   Vitals:   01/21/22 0030 01/21/22 0138 01/21/22 0145 01/21/22 0230  BP: (!) 163/96 (!) 178/102  (!) 165/102  Pulse: 78   84  Resp: 17   20  Temp:   98.7 F (37.1 C)   TempSrc:   Oral   SpO2: 93%   93%  Weight:      Height:       No intake or output data in the 24 hours ending 01/21/22 0339    01/20/2022    9:48 PM 08/12/2021    8:47 PM 05/27/2021    9:21 PM  Last 3 Weights  Weight (lbs) 200 lb 187 lb 13.3 oz 187 lb 13.3 oz  Weight (kg) 90.719 kg 85.2 kg 85.2 kg     Body mass index is 28.7 kg/m.  General:  Well nourished, well developed,  in no acute distress HEENT: normal Neck: no JVD Vascular: No carotid bruits; Distal pulses 2+ bilaterally Cardiac:  normal S1, S2; RRR; no murmur  Lungs:  clear to auscultation bilaterally, no wheezing, rhonchi or rales  Abd: soft, nontender,  no hepatomegaly  Ext: no edema Musculoskeletal:  No deformities, BUE and BLE strength normal and equal Skin: warm and dry  Neuro:  CNs 2-12 intact, no focal abnormalities noted Psych:  Normal affect    Laboratory Data:  High Sensitivity Troponin:   Recent Labs  Lab 01/20/22 2150  TROPONINIHS 32*     Chemistry Recent Labs  Lab 01/20/22 2150  NA 143  K 4.3  CL 102  CO2 32  GLUCOSE 218*  BUN 22  CREATININE 1.33*  CALCIUM 9.1  GFRNONAA 53*  ANIONGAP 9    No results for input(s): "PROT", "ALBUMIN", "AST", "ALT", "ALKPHOS", "BILITOT" in the last 168 hours. Lipids  Recent Labs  Lab 01/21/22 0200  CHOL 190  TRIG 264*  HDL 40*  LDLCALC 97  CHOLHDL 4.8    Hematology Recent Labs  Lab 01/20/22 2150  WBC 7.2  RBC 4.85  HGB 15.2  HCT 48.5  MCV 100.0  MCH 31.3  MCHC 31.3  RDW 14.1  PLT 166   Thyroid No results for input(s): "TSH", "FREET4" in the last 168 hours.  BNPNo results for input(s): "BNP", "PROBNP" in the last 168 hours.  DDimer No results for input(s): "DDIMER" in the last 168 hours.   Radiology/Studies:  DG Chest 2 View  Result Date: 01/20/2022 CLINICAL DATA:  Chest pain EXAM: CHEST - 2 VIEW COMPARISON:  08/13/2021 FINDINGS: Stable elevation of the right hemidiaphragm. Mild left basilar parenchymal scarring. No superimposed focal pulmonary infiltrate. No pneumothorax or pleural effusion. Cardiac size is within normal limits. Ventriculoperitoneal shunt catheter tubing overlies the right hemithorax, unchanged. No acute bone abnormality. IMPRESSION: No radiographic evidence of acute cardiopulmonary disease. Electronically Signed   By: Fidela Salisbury M.D.   On: 01/20/2022 22:37     Assessment and Plan:   Epigastric pain with  elevated troponin ( 32) in the setting of uncontrolled HTN Uncontrolled HTN (BP 196/111) 3.  Non obstructive CAD (20% stenosis on CATH 2020) 4. Multiple comorbidities:  CKD stage 3b, DM-2, Chronic respiratory depression on oxygen,  and other comorbidities   Plan: - admit to medicine, trend trops. Obtain ECHO in am  - patient c/o more of epigastric pain than chest pain, denies exertional angina. Would explore GI pathology causing epigastric pain. Low suspicion for ACS. - continue aspirin, coreg, would try a different statin- perhaps Rosuvastatin '10mg'$  daily, continue ezetimibe - continue other home medications.   - will follow along  Risk Assessment/Risk Scores:     HEAR Score (for undifferentiated chest pain):             For questions or updates, please contact [place] Please consult [email or web address] for contact info under    Signed, Renae Fickle, MD  01/21/2022 3:39 AM

## 2022-01-21 NOTE — Progress Notes (Signed)
Critical Lab: Troponin 114, reported by [facility] @ 0622. Hospitalist notified.

## 2022-01-21 NOTE — Progress Notes (Signed)
PROGRESS NOTE  DEVAUGHN SAVANT HGD:[id number] DOB: [birth year]   PCP: [name], MD  Patient is from: Home.  Lives with his wife.  Independently ambulates at baseline.  DOA: 01/20/2022 LOS: 0  Chief complaints Chief Complaint  Patient presents with   Chest Pain     Brief Narrative / Interim history: 81 year old M with PMH of NPH/VP shunt, DM-2, chronic RF on 2 L, HTN and HLD presenting with 2 days history of chest/epigastric pressure sensation.  Patient denies exertional chest pain, dyspnea, SOB, DOE, diaphoresis, nausea, vomiting or fever.  In ED, elevated BP to 196/111.  Troponin 32.  EKG with NSR and RBBB.  Cardiology consulted and recommended admission.  Patient remained chest pain-free since admission.  Troponin trended up to 114.  TTE ordered and pending.   Subjective: Seen and examined earlier this morning.  No major events overnight of this morning.  No further chest pressure or discomfort since admission.  Denies shortness of breath, dyspnea, dizziness, fever, cough, nausea, vomiting or abdominal pain.  Objective: Vitals:   01/21/22 0145 01/21/22 0230 01/21/22 0300 01/21/22 0908  BP:  (!) 165/102  (!) 174/100  Pulse:  84  87  Resp:  20  18  Temp: 98.7 F (37.1 C)   98.7 F (37.1 C)  TempSrc: Oral   Oral  SpO2:  93%  90%  Weight:   89.3 kg   Height:   '5\' 10"'$  (1.778 m)     Examination:  GENERAL: No apparent distress.  Nontoxic. HEENT: MMM.  Vision and hearing grossly intact.  NECK: Supple.  No apparent JVD.  RESP:  No IWOB.  Fair aeration bilaterally. CVS:  RRR. Heart sounds normal.  ABD/GI/GU: BS+. Abd soft, NTND.  MSK/EXT:  Moves extremities. No apparent deformity. No edema.  SKIN: no apparent skin lesion or wound NEURO: Awake, alert and oriented appropriately.  No apparent focal neuro deficit. PSYCH: Calm. Normal affect.   Procedures:  None  Microbiology summarized: None  Assessment and plan: Principal Problem:   Chest pressure Active  Problems:   HLD (hyperlipidemia)   HTN (hypertension)   Presence of ventriculopleural shunt   DMII (diabetes mellitus, type 2) (HCC)   Chronic respiratory failure with hypoxia, on home O2 therapy (HCC) - 2 L/min prn per patient   NPH (normal pressure hydrocephalus) (HCC)  Non-STEMI: Patient denies exertional chest pain or DOE.  Chest/epigastric discomfort seems to have resolved since admission.  However, troponin trended up to 654.  EKG without acute ischemic finding.  BP remains elevated.  Cardiopulmonary exam reassuring. -Cardiology following -Will discuss with cardiology about heparin. -Continue cycling troponin -Check echocardiogram -Increase Coreg and Protonix -Continue low-dose aspirin and Zetia.  History of statin intolerance.  Uncontrolled hypertension: BP remains elevated. -Increase home Coreg to 6.25 mg twice daily. -We will titrate further as needed  NPH (normal pressure hydrocephalus)/VP shunt -Chronic. Stable.   Chronic respiratory failure with hypoxia: Stable.  Uses 2 L as needed. -Continue home oxygen as needed   Uncontrolled NIDDM-2 with hyperglycemia and CKD-3B: A1c 7.9%.  On low-dose metformin at home. Recent Labs  Lab 01/21/22 0154 01/21/22 0604 01/21/22 0858  GLUCAP 224* 165* 209*  -Increase SSI to moderate -Add NovoLog 4 units 3 times daily with meals -Add Semglee 10 units daily -Further adjustment as appropriate  CKD-3B: Stable. Recent Labs    05/11/21 0237 05/12/21 0112 05/13/21 0105 05/27/21 1146 05/28/21 0601 05/30/21 0516 08/12/21 2110 01/20/22 2150  BUN 35* 42* 36* 37* 45* 30* 24* 22  CREATININE 2.62* 2.92* 2.10* 1.80* 1.97* 1.35* 1.63* 1.33*  -Continue monitoring    HLD: LDL 97.  History of statin intolerance. Continue zetia.    Body mass index is 28.25 kg/m.          DVT prophylaxis:  heparin injection 5,000 Units Start: 01/21/22 0130 SCDs Start: 01/21/22 0116  Code Status: Full code Family Communication: None at  bedside Level of care: Telemetry Cardiac Status is: Observation The patient will require care spanning > 2 midnights and should be moved to inpatient because: Non-STEMI and uncontrolled hypertension   Final disposition: Home once medically cleared Consultants:  Cardiology  Sch Meds:  Scheduled Meds:  aspirin EC  81 mg Oral Daily   carvedilol  6.25 mg Oral BID WC   ezetimibe  10 mg Oral Daily   heparin  5,000 Units Subcutaneous Q8H   insulin aspart  0-15 Units Subcutaneous TID WC   insulin aspart  0-5 Units Subcutaneous QHS   insulin aspart  4 Units Subcutaneous TID WC   insulin glargine-yfgn  10 Units Subcutaneous Daily   pantoprazole  40 mg Oral QAC breakfast   Continuous Infusions: PRN Meds:.acetaminophen **OR** acetaminophen, ondansetron **OR** ondansetron (ZOFRAN) IV  Antimicrobials: Anti-infectives (From admission, onward)    None        I have personally reviewed the following labs and images: CBC: Recent Labs  Lab 01/20/22 2150  WBC 7.2  HGB 15.2  HCT 48.5  MCV 100.0  PLT 166   BMP &GFR Recent Labs  Lab 01/20/22 2150  NA 143  K 4.3  CL 102  CO2 32  GLUCOSE 218*  BUN 22  CREATININE 1.33*  CALCIUM 9.1   Estimated Creatinine Clearance: 48.2 mL/min (A) (by C-G formula based on SCr of 1.33 mg/dL (H)). Liver & Pancreas: No results for input(s): "AST", "ALT", "ALKPHOS", "BILITOT", "PROT", "ALBUMIN" in the last 168 hours. No results for input(s): "LIPASE", "AMYLASE" in the last 168 hours. No results for input(s): "AMMONIA" in the last 168 hours. Diabetic: Recent Labs    01/21/22 0200  HGBA1C 7.9*   Recent Labs  Lab 01/21/22 0154 01/21/22 0604 01/21/22 0858  GLUCAP 224* 165* 209*   Cardiac Enzymes: No results for input(s): "CKTOTAL", "CKMB", "CKMBINDEX", "TROPONINI" in the last 168 hours. No results for input(s): "PROBNP" in the last 8760 hours. Coagulation Profile: No results for input(s): "INR", "PROTIME" in the last 168 hours. Thyroid  Function Tests: No results for input(s): "TSH", "T4TOTAL", "FREET4", "T3FREE", "THYROIDAB" in the last 72 hours. Lipid Profile: Recent Labs    01/21/22 0200  CHOL 190  HDL 40*  LDLCALC 97  TRIG 264*  CHOLHDL 4.8   Anemia Panel: No results for input(s): "VITAMINB12", "FOLATE", "FERRITIN", "TIBC", "IRON", "RETICCTPCT" in the last 72 hours. Urine analysis:    Component Value Date/Time   COLORURINE YELLOW 08/12/2021 2308   APPEARANCEUR CLEAR 08/12/2021 2308   LABSPEC 1.020 08/12/2021 2308   PHURINE 6.0 08/12/2021 2308   GLUCOSEU 50 (A) 08/12/2021 2308   HGBUR NEGATIVE 08/12/2021 2308   BILIRUBINUR NEGATIVE 08/12/2021 2308   KETONESUR NEGATIVE 08/12/2021 2308   PROTEINUR 30 (A) 08/12/2021 2308   UROBILINOGEN 1.0 08/25/2008 0326   NITRITE NEGATIVE 08/12/2021 2308   LEUKOCYTESUR NEGATIVE 08/12/2021 2308   Sepsis Labs: Invalid input(s): "PROCALCITONIN", "LACTICIDVEN"  Microbiology: No results found for this or any previous visit (from the past 240 hour(s)).  Radiology Studies: DG Chest 2 View  Result Date: 01/20/2022 CLINICAL DATA:  Chest pain EXAM: CHEST - 2  VIEW COMPARISON:  08/13/2021 FINDINGS: Stable elevation of the right hemidiaphragm. Mild left basilar parenchymal scarring. No superimposed focal pulmonary infiltrate. No pneumothorax or pleural effusion. Cardiac size is within normal limits. Ventriculoperitoneal shunt catheter tubing overlies the right hemithorax, unchanged. No acute bone abnormality. IMPRESSION: No radiographic evidence of acute cardiopulmonary disease. Electronically Signed   By: Fidela Salisbury M.D.   On: 01/20/2022 22:37      Mylan Schwarz T. [place]  If 7PM-7AM, please contact night-coverage [email or web address] 01/21/2022, 10:48 AM

## 2022-01-21 NOTE — Assessment & Plan Note (Signed)
Chronic Stable 

## 2022-01-21 NOTE — Assessment & Plan Note (Signed)
Continue coreg 3.125 mg bid. Ordered 1 dose of IV hydralazine in ER.

## 2022-01-21 NOTE — Progress Notes (Signed)
Rounding Note    Patient Name: Jeremiah [name] Date of Encounter: 01/21/2022  [place] Cardiologist: None New Dr. Margaretann Loveless  Subjective   No additional Abd pain however now with rising trop  Inpatient Medications    Scheduled Meds:  aspirin EC  81 mg Oral Daily   carvedilol  6.25 mg Oral BID WC   ezetimibe  10 mg Oral Daily   heparin  5,000 Units Subcutaneous Q8H   insulin aspart  0-15 Units Subcutaneous TID WC   insulin aspart  0-5 Units Subcutaneous QHS   insulin aspart  4 Units Subcutaneous TID WC   insulin glargine-yfgn  10 Units Subcutaneous Daily   pantoprazole  40 mg Oral QAC breakfast   Continuous Infusions:  PRN Meds: acetaminophen **OR** acetaminophen, ondansetron **OR** ondansetron (ZOFRAN) IV   Vital Signs    Vitals:   01/21/22 0145 01/21/22 0230 01/21/22 0300 01/21/22 0908  BP:  (!) 165/102  (!) 174/100  Pulse:  84  87  Resp:  20  18  Temp: 98.7 F (37.1 C)   98.7 F (37.1 C)  TempSrc: Oral   Oral  SpO2:  93%  90%  Weight:   89.3 kg   Height:   '5\' 10"'$  (1.778 m)    No intake or output data in the 24 hours ending 01/21/22 1123    01/21/2022    3:00 AM 01/20/2022    9:48 PM 08/12/2021    8:47 PM  Last 3 Weights  Weight (lbs) 196 lb 14.4 oz 200 lb 187 lb 13.3 oz  Weight (kg) 89.313 kg 90.719 kg 85.2 kg      Telemetry    SR - Personally Reviewed  ECG    NSR, RBBB - Personally Reviewed  Physical Exam   GEN: No acute distress.   Neck: No JVD Cardiac: RRR, no murmurs, rubs, or gallops.  Respiratory: Clear to auscultation bilaterally. GI: Soft, nontender, non-distended  MS: No edema; No deformity. Neuro:  Nonfocal  Psych: Normal affect   Labs    High Sensitivity Troponin:   Recent Labs  Lab 01/20/22 2150 01/21/22 0200 01/21/22 0928  TROPONINIHS 32* 114* 654*     Chemistry Recent Labs  Lab 01/20/22 2150  NA 143  K 4.3  CL 102  CO2 32  GLUCOSE 218*  BUN 22  CREATININE 1.33*  CALCIUM 9.1  GFRNONAA 53*   ANIONGAP 9    Lipids  Recent Labs  Lab 01/21/22 0200  CHOL 190  TRIG 264*  HDL 40*  LDLCALC 97  CHOLHDL 4.8    Hematology Recent Labs  Lab 01/20/22 2150  WBC 7.2  RBC 4.85  HGB 15.2  HCT 48.5  MCV 100.0  MCH 31.3  MCHC 31.3  RDW 14.1  PLT 166   Thyroid No results for input(s): "TSH", "FREET4" in the last 168 hours.  BNPNo results for input(s): "BNP", "PROBNP" in the last 168 hours.  DDimer No results for input(s): "DDIMER" in the last 168 hours.   Radiology    DG Chest 2 View  Result Date: 01/20/2022 CLINICAL DATA:  Chest pain EXAM: CHEST - 2 VIEW COMPARISON:  08/13/2021 FINDINGS: Stable elevation of the right hemidiaphragm. Mild left basilar parenchymal scarring. No superimposed focal pulmonary infiltrate. No pneumothorax or pleural effusion. Cardiac size is within normal limits. Ventriculoperitoneal shunt catheter tubing overlies the right hemithorax, unchanged. No acute bone abnormality. IMPRESSION: No radiographic evidence of acute cardiopulmonary disease. Electronically Signed   By: Linwood Dibbles.D.  On: 01/20/2022 22:37    Cardiac Studies   Echo pending  Patient Profile     81 y.o. 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, type 2 diabetes, chronic respiratory failure on home oxygen, hyperlipidemia, hypertension who is being seen for the evaluation of epigastric pain radiating to the chest per admission notes  Assessment & Plan    Principal Problem:   Chest pressure Active Problems:   HLD (hyperlipidemia)   HTN (hypertension)   Presence of ventriculopleural shunt   DMII (diabetes mellitus, type 2) (HCC)   Chronic respiratory failure with hypoxia, on home O2 therapy (HCC) - 2 L/min prn per patient   NPH (normal pressure hydrocephalus) (HCC)  Epigastric pain Elevated tropoinin - concern for NSTEMI with anginal equivalent.  - no significant disease on prior cath 2020, unclear etiology of elevated trop. If no CAD, concern  for demand ischemia with presentation of abdominal pain. Would check LFTs, amylase, lipase etc for workup of abd pain per IM.  - will plan for cardiac cath on Tuesday sept 5 if no alternative cause of troponin elevation identified before then.  - cont asa 81 mg daily, carvedilol (dose increased), zetia, insulin.  - start heparin gtt - pt in agreement with plan.    For questions or updates, please contact [place] Please consult [email or web address] for contact info under        Signed, Elouise Munroe, MD  01/21/2022, 11:23 AM

## 2022-01-21 NOTE — Progress Notes (Addendum)
ANTICOAGULATION CONSULT NOTE - Initial Consult  Pharmacy Consult for heparin Indication: chest pain/ACS  Allergies  Allergen Reactions   Lipitor [Atorvastatin] Other (See Comments)    Hip pain    Patient Measurements: Height: '5\' 10"'$  (177.8 cm) Weight: 89.3 kg (196 lb 14.4 oz) IBW/kg (Calculated) : 73 Heparin Dosing Weight: 89.3  Vital Signs: Temp: 98.7 F (37.1 C) (09/02 0908) Temp Source: Oral (09/02 0908) BP: 174/100 (09/02 0908) Pulse Rate: 87 (09/02 0908)  Labs: Recent Labs    01/20/22 2150 01/21/22 0200 01/21/22 0928  HGB 15.2  --   --   HCT 48.5  --   --   PLT 166  --   --   CREATININE 1.33*  --   --   TROPONINIHS 32* 114* 654*    Estimated Creatinine Clearance: 48.2 mL/min (A) (by C-G formula based on SCr of 1.33 mg/dL (H)).   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   S/P radiation therapy 04/23/13 - 05/29/14   Prostate/seminal vesicles, external beam 4500 cGy in 25 sessions   Seasonal allergies    Skin cancer    scalp   Small bowel obstruction (HCC) 06/10/2016   Wears glasses     Medications:  Scheduled:   aspirin EC  81 mg Oral Daily   carvedilol  6.25 mg Oral BID WC   ezetimibe  10 mg Oral Daily   heparin  5,000 Units Subcutaneous Q8H   insulin aspart  0-15 Units Subcutaneous TID WC   insulin aspart  0-5 Units Subcutaneous QHS   insulin aspart  4 Units Subcutaneous TID WC   insulin glargine-yfgn  10 Units Subcutaneous Daily   pantoprazole  40 mg Oral QAC breakfast    Assessment: 81 YO male presenting 9/1 with chest pain, concerning for NSTEMI. Troponin 654 (trending up). Plan for cardiac cath on 9/5  if no other source identified for elevated troponin. No AC PTA.   Hgb 15.2, stable. Platelets 166, stable.   Goal of Therapy:  Heparin level 0.3-0.7 units/ml Monitor platelets by anticoagulation protocol: Yes   Plan:  Give 4000 units bolus x 1 Start heparin infusion at 1100 units/hr Check anti-Xa level in 8 hours and daily while on heparin Continue to monitor H&H and platelets   Billey Gosling, PharmD PGY1 Pharmacy Resident 9/2/202311:45 AM

## 2022-01-21 NOTE — Subjective & Objective (Signed)
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  States that this started around Thursday evening.  No radiation to his chest pressure.  Substernal in nature.  No nausea no vomiting no diaphoresis.  Chest pressure is continued throughout the day.  Patient finally came to the ER for evaluation.  Patient denies any fever, chills, diarrhea.  Patient states that he has home oxygen.  He has a concentrator and liquid oxygen at home.  He only uses as needed.  Presented with room air saturations 86%.  Initial vital signs temp 99 heart rate 84 blood pressure 196/111 with room air saturations 86%.  Initial troponin was 32.  EKG on my interpretation showed normal sinus rhythm with right bundle branch block.  EDP discussed the case with cardiology recommend patient be admitted to the hospital.  They will see in consultation.  Second set of troponin is not yet been drawn.  EDP has agreed to follow-up on the second set of troponin and order IV heparin if the troponins are uptrending.

## 2022-01-21 NOTE — Assessment & Plan Note (Signed)
Add SSI. Check A1c. 

## 2022-01-21 NOTE — Evaluation (Signed)
Occupational Therapy Evaluation Patient Details Name: Jeremiah [name] MRN: [id number] DOB: [birth year] Today's Date: 01/21/2022   History of Present Illness 81 y/o male presented to ED on 01/20/22 for chest pain. EKG showed R bundle branch block. Admitted for elevated troponin in setting of uncontrolled HTN. PMH: BPH, DM, HTN, OSA on CPAP, AAA, prostate CA s/p raditation, NPH s/p VP shunt   Clinical Impression   Pt admitted for concerns listed above. PTA pt reported that he was independent with all ADL's and IADL's, including caring for his wife. At this time, pt presents near his baseline, able to complete ADL's and functional mobility with a RW. He continues to need O2 with mobility, as he did drop down to 86% on RA, however he was able to improve it to 90% with pursed lip breathing. Pt has no further skilled OT needs and acute OT will sign off.       Recommendations for follow up therapy are one component of a multi-disciplinary discharge planning process, led by the attending physician.  Recommendations may be updated based on patient status, additional functional criteria and insurance authorization.   Follow Up Recommendations  No OT follow up    Assistance Recommended at Discharge None  Patient can return home with the following      Functional Status Assessment  Patient has had a recent decline in their functional status and demonstrates the ability to make significant improvements in function in a reasonable and predictable amount of time.  Equipment Recommendations  None recommended by OT    Recommendations for Other Services       Precautions / Restrictions Precautions Precautions: Fall Restrictions Weight Bearing Restrictions: No      Mobility Bed Mobility Overal bed mobility: Modified Independent                  Transfers Overall transfer level: Modified independent Equipment used: None                      Balance Overall balance assessment:  Mild deficits observed, not formally tested                                         ADL either performed or assessed with clinical judgement   ADL Overall ADL's : At baseline;Modified independent                                       General ADL Comments: No assist needed     Vision Baseline Vision/History: 0 No visual deficits Ability to See in Adequate Light: 0 Adequate Patient Visual Report: No change from baseline Vision Assessment?: No apparent visual deficits     Perception     Praxis      Pertinent Vitals/Pain Pain Assessment Pain Assessment: No/denies pain     Hand Dominance Right   Extremity/Trunk Assessment Upper Extremity Assessment Upper Extremity Assessment: Overall WFL for tasks assessed   Lower Extremity Assessment Lower Extremity Assessment: Overall WFL for tasks assessed   Cervical / Trunk Assessment Cervical / Trunk Assessment: Normal   Communication Communication Communication: No difficulties   Cognition Arousal/Alertness: Awake/alert Behavior During Therapy: WFL for tasks assessed/performed Overall Cognitive Status: Within Functional Limits for tasks assessed  General Comments  On 2L O2 at rest, spO2 >95%. Removed for mobility with drop to 86% but quickly improved to 90% with instruction of pursed lip breathing    Exercises     Shoulder Instructions      Home Living Family/patient expects to be discharged to:: Private residence Living Arrangements: Spouse/significant other Available Help at Discharge: Family;Available 24 hours/day Type of Home: House Home Access: Stairs to enter [employer] of Steps: 2 Entrance Stairs-Rails: Can reach both;Left;Right Home Layout: Multi-level;Able to live on main level with bedroom/bathroom Alternate Level Stairs-Number of Steps: stays on main level   Bathroom Shower/Tub: Radiographer, therapeutic: Standard     Home Equipment: Shower seat;Grab bars - tub/shower;Hand held shower head;Cane - single point;Rolling Environmental consultant (2 wheels)   Additional Comments: spouse has dementia, has CNA to help 2x/ week and family PRN      Prior Functioning/Environment Prior Level of Function : Independent/Modified Independent                        OT Problem List: Decreased strength;Decreased activity tolerance;Impaired balance (sitting and/or standing);Decreased coordination;Decreased safety awareness;Decreased cognition      OT Treatment/Interventions:      OT Goals(Current goals can be found in the care plan section) Acute Rehab OT Goals Patient Stated Goal: To go home OT Goal Formulation: With patient Time For Goal Achievement: 01/21/22 Potential to Achieve Goals: Good  OT Frequency:      Co-evaluation              AM-PAC OT "6 Clicks" Daily Activity     Outcome Measure Help from another person eating meals?: None Help from another person taking care of personal grooming?: None Help from another person toileting, which includes using toliet, bedpan, or urinal?: None Help from another person bathing (including washing, rinsing, drying)?: None Help from another person to put on and taking off regular upper body clothing?: None Help from another person to put on and taking off regular lower body clothing?: None 6 Click Score: 24   End of Session Equipment Utilized During Treatment: Rolling walker (2 wheels) Nurse Communication: Mobility status  Activity Tolerance: Patient tolerated treatment well Patient left: in bed;with call bell/phone within reach  OT Visit Diagnosis: Unsteadiness on feet (R26.81);Other abnormalities of gait and mobility (R26.89);Muscle weakness (generalized) (M62.81)                Time: 1287-8676 OT Time Calculation (min): 21 min Charges:  OT General Charges $OT Visit: 1 Visit OT Evaluation $OT Eval Moderate Complexity: [place], OTR/L [facility], Acute Rehab  Adana Marik Elane Yolanda Bonine 01/21/2022, 1:02 PM

## 2022-01-21 NOTE — Progress Notes (Signed)
ANTICOAGULATION CONSULT NOTE  Pharmacy Consult for heparin Indication: chest pain/ACS  Allergies  Allergen Reactions   Lipitor [Atorvastatin] Other (See Comments)    Hip pain    Patient Measurements: Height: '5\' 10"'$  (177.8 cm) Weight: 89.3 kg (196 lb 14.4 oz) IBW/kg (Calculated) : 73 HEPARIN DW (KG): 89.3   Vital Signs: Temp: 98.1 F (36.7 C) (09/02 2109) Temp Source: Oral (09/02 2109) BP: 157/94 (09/02 2109) Pulse Rate: 80 (09/02 2109)  Labs: Recent Labs    01/20/22 2150 01/21/22 0200 01/21/22 0928 01/21/22 1202 01/21/22 2022  HGB 15.2  --   --   --   --   HCT 48.5  --   --   --   --   PLT 166  --   --   --   --   HEPARINUNFRC  --   --   --   --  0.26*  CREATININE 1.33*  --   --  1.13  --   TROPONINIHS 32* 114* 654* 823*  --      Estimated Creatinine Clearance: 56.7 mL/min (by C-G formula based on SCr of 1.13 mg/dL).   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   S/P radiation therapy 04/23/13 - 05/29/14   Prostate/seminal vesicles, external beam 4500 cGy in 25 sessions   Seasonal allergies    Skin cancer    scalp   Small bowel obstruction (HCC) 06/10/2016   Wears glasses     Medications:  Scheduled:   aspirin EC  81 mg Oral Daily   carvedilol  6.25 mg Oral BID WC   ezetimibe  10 mg Oral Daily   insulin aspart  0-15 Units Subcutaneous TID WC   insulin aspart  0-5 Units Subcutaneous QHS   insulin aspart  4 Units Subcutaneous TID WC   insulin glargine-yfgn  10 Units Subcutaneous Daily   pantoprazole  40 mg Oral QAC breakfast    heparin 1,100 Units/hr (01/21/22 1326)    Assessment: 81 YO male presenting 9/1 with  chest pain, concerning for NSTEMI. Troponin 654 (trending up). Plan for cardiac cath on 9/5 if no other source identified for elevated troponin. No AC PTA.  -heparin level = 0.26 on 1100 units/hr   Goal of Therapy:  Heparin level 0.3-0.7 units/ml Monitor platelets by anticoagulation protocol: Yes   Plan:  -Increase heparin to 1250 units/hr -heparin level and CBC in am  Hildred Laser, PharmD Clinical Pharmacist **Pharmacist phone directory can now be found on [email or web address] ([email or web address]).  Listed under [place].

## 2022-01-21 NOTE — Evaluation (Signed)
Physical Therapy Evaluation & Discharge Patient Details Name: Jeremiah [name] MRN: [id number] DOB: 10/31/1940 Today's Date: 01/21/2022  History of Present Illness  81 y/o male presented to ED on 01/20/22 for chest pain. EKG showed R bundle branch block. Admitted for elevated troponin in setting of uncontrolled HTN. PMH: BPH, DM, HTN, OSA on CPAP, AAA, prostate CA s/p raditation, NPH s/p VP shunt  Clinical Impression  Patient admitted with the above. PTA, patient lives with wife whom he cares for as she has dementia. Patient functioning at modI level which seems to be baseline for patient. Ambulated on RA with spO2 drop to 86% but quickly improved to 90% with instruction of pursed lip breathing. Encouraged patient to ambulate with mobility specialists and nursing staff. No further skilled PT needs required acutely. No PT follow up recommended at this time. PT will sign off.        Recommendations for follow up therapy are one component of a multi-disciplinary discharge planning process, led by the attending physician.  Recommendations may be updated based on patient status, additional functional criteria and insurance authorization.  Follow Up Recommendations No PT follow up      Assistance Recommended at Discharge PRN  Patient can return home with the following       Equipment Recommendations None recommended by PT  Recommendations for Other Services       Functional Status Assessment Patient has not had a recent decline in their functional status     Precautions / Restrictions Precautions Precautions: Fall Restrictions Weight Bearing Restrictions: No      Mobility  Bed Mobility Overal bed mobility: Modified Independent                  Transfers Overall transfer level: Modified independent Equipment used: None                    Ambulation/Gait Ambulation/Gait assistance: Modified independent (Device/Increase time) Gait Distance (Feet): 250 Feet Assistive  device: None Gait Pattern/deviations: WFL(Within Functional Limits)   Gait velocity interpretation: >4.37 ft/sec, indicative of normal walking speed   General Gait Details: spO2 dropped to 86% on RA but quickly improved to 90% with instruction of pursed lip breathing  Stairs            Wheelchair Mobility    Modified Rankin (Stroke Patients Only)       Balance Overall balance assessment: Mild deficits observed, not formally tested                                           Pertinent Vitals/Pain Pain Assessment Pain Assessment: No/denies pain    Home Living Family/patient expects to be discharged to:: Private residence Living Arrangements: Spouse/significant other Available Help at Discharge: Family;Available 24 hours/day Type of Home: House Home Access: Stairs to enter Entrance Stairs-Rails: Can reach both;Left;Right Entrance Stairs-Number of Steps: 2 Alternate Level Stairs-Number of Steps: stays on main level Home Layout: Multi-level;Able to live on main level with bedroom/bathroom Home Equipment: Shower seat;Grab bars - tub/shower;Hand held shower head;Cane - single point;Rolling Naol Ontiveros (2 wheels) Additional Comments: spouse has dementia, has CNA to help 2x/ week and family PRN    Prior Function Prior Level of Function : Independent/Modified Independent                     Hand Dominance  Extremity/Trunk Assessment   Upper Extremity Assessment Upper Extremity Assessment: Defer to OT evaluation    Lower Extremity Assessment Lower Extremity Assessment: Overall WFL for tasks assessed    Cervical / Trunk Assessment Cervical / Trunk Assessment: Normal  Communication   Communication: No difficulties  Cognition Arousal/Alertness: Awake/alert Behavior During Therapy: WFL for tasks assessed/performed Overall Cognitive Status: Within Functional Limits for tasks assessed                                           General Comments General comments (skin integrity, edema, etc.): On 2L O2 at rest, spO2 >95%. Removed for mobility with drop to 86% but quickly improved to 90% with instruction of pursed lip breathing    Exercises     Assessment/Plan    PT Assessment Patient does not need any further PT services  PT Problem List         PT Treatment Interventions      PT Goals (Current goals can be found in the Care Plan section)  Acute Rehab PT Goals Patient Stated Goal: to figure out what's wrong PT Goal Formulation: All assessment and education complete, DC therapy    Frequency       Co-evaluation               AM-PAC PT "6 Clicks" Mobility  Outcome Measure Help needed turning from your back to your side while in a flat bed without using bedrails?: None Help needed moving from lying on your back to sitting on the side of a flat bed without using bedrails?: None Help needed moving to and from a bed to a chair (including a wheelchair)?: None Help needed standing up from a chair using your arms (e.g., wheelchair or bedside chair)?: None Help needed to walk in hospital room?: None Help needed climbing 3-5 steps with a railing? : None 6 Click Score: 24    End of Session   Activity Tolerance: Patient tolerated treatment well Patient left: in bed;with call bell/phone within reach Nurse Communication: Mobility status PT Visit Diagnosis: Muscle weakness (generalized) (M62.81)    Time: 0626-9485 PT Time Calculation (min) (ACUTE ONLY): 21 min   Charges:   PT Evaluation $PT Eval Low Complexity: 1 Low          Kameran Mcneese A. Gilford Rile PT, DPT Acute Rehabilitation Services Office [phone]   Linna Hoff 01/21/2022, 12:41 PM

## 2022-01-21 NOTE — Progress Notes (Signed)
  Echocardiogram 2D Echocardiogram has been performed.  Jeremiah [name] 01/21/2022, 12:44 PM

## 2022-01-21 NOTE — Assessment & Plan Note (Signed)
Admit to Observation cardiac bed. EDP has consulted cardiology who will see patient in AM. 2nd set of troponin not yet drawn. EDP to followup on 2nd troponin and start IV heparin if troponin trends upwards(per my verbal conversation with EDP). Continue ASA. Pt already took dose of ASA today.

## 2022-01-21 NOTE — H&P (Signed)
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  States that this started around Thursday evening.  No radiation to his chest pressure.  Substernal in nature.  No nausea no vomiting no diaphoresis.  Chest pressure is continued throughout the day.  Patient finally came to the ER for evaluation.  Patient denies any fever, chills, diarrhea.  Patient states that he has home oxygen.  He has a concentrator and liquid oxygen at home.  He only uses as needed.  Presented with room air saturations 86%.  Initial vital signs temp 99 heart rate 84 blood pressure 196/111 with room air saturations 86%.  Initial troponin was 32.  EKG on my interpretation showed normal sinus rhythm with right bundle branch block.  EDP discussed the case with cardiology recommend patient be admitted to the hospital.  They will see in consultation.  Second set of troponin is not yet been drawn.  EDP has agreed to follow-up on the second set of troponin and order IV heparin if the troponins are uptrending.   ED Course: initial troponin 32. EKG shows NSR  Review of Systems:  Review of Systems  Constitutional:  Positive for malaise/fatigue.  HENT: Negative.    Eyes: Negative.   Respiratory: Negative.    Cardiovascular:        Substernal chest pressure. No radiation. No N/V. No diaphoresis.  Gastrointestinal: Negative.   Genitourinary: Negative.   Musculoskeletal: Negative.   Skin: Negative.   Neurological: Negative.    Endo/Heme/Allergies: Negative.   Psychiatric/Behavioral: Negative.    All other systems reviewed and are negative.   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   S/P radiation therapy 04/23/13 - 05/29/14   Prostate/seminal vesicles, external beam 4500 cGy in 25 sessions   Seasonal allergies    Skin cancer    scalp   Small bowel obstruction ([place]) 06/10/2016   Wears glasses     Past Surgical History:  Procedure Laterality Date   BACK SURGERY  2009   COLONOSCOPY W/ BIOPSIES AND POLYPECTOMY     CYSTOSCOPY W/ URETERAL STENT PLACEMENT Left 05/12/2021   Procedure: CYSTOSCOPY WITH RETROGRADE PYELOGRAM/ LEFT URETERAL STENT PLACEMENT.;  Surgeon: [name], MD;  Location: [facility];  Service: Urology;  Laterality: Left;   [place]   LEFT HEART CATH AND CORONARY ANGIOGRAPHY N/A 03/14/2019   Procedure: LEFT HEART CATH AND CORONARY ANGIOGRAPHY;  Surgeon: [name], MD;  Location: [facility];  Service: Cardiovascular;  Laterality: N/A;   PROSTATE BIOPSY  2013, 2014, 01/2014   Gleason 7   RADIOACTIVE SEED IMPLANT N/A 07/01/2014   Procedure: RADIOACTIVE  SEED IMPLANT;  Surgeon: [name], MD;  Location[facility];  Service: Urology;  Laterality: N/A;  DR PORTABLE   TONSILLECTOMY     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   Procedure: SHUNT INSERTION VENTRICULAR-PERITONEAL;  Surgeon: [name], MD;  Location: [facility];  Service: Neurosurgery;  Laterality: N/A;  SHUNT INSERTION VENTRICULAR-PERITONEAL     reports that he quit smoking about 60 years ago. His smoking use included cigarettes. He has never used smokeless  tobacco. He reports current alcohol use of about 1.0 standard drink of alcohol per week. He reports that he does not use drugs.  Allergies  Allergen Reactions   Lipitor [Atorvastatin] Other (See Comments)    Hip pain    Family History  Problem Relation Age of Onset   Heart attack Father 59   Cancer Father        prostate   Diabetes Father    Cancer Paternal Uncle        prostate   Dementia Mother 56   Lung cancer Brother     Prior to Admission medications   Medication Sig Start Date End Date Taking? Authorizing Provider  acetaminophen (TYLENOL) 500 MG tablet Take 1 tablet (500 mg total) by mouth every 6 (six) hours as needed for mild pain or moderate pain. 05/13/21   Thurnell Lose, MD  aspirin EC 81 MG tablet Take 81 mg by mouth daily.     [provider]  carvedilol (COREG) 3.125 MG tablet Take 1 tablet (3.125 mg total) by mouth 2 (two) times daily with a meal. 05/13/21   Thurnell Lose, MD  cholecalciferol (VITAMIN D3) 25 MCG (1000 UNIT) tablet Take 1,000 Units by mouth daily.    [provider]  ezetimibe (ZETIA) 10 MG tablet Take 10 mg by mouth daily. 05/02/21   [provider]  meclizine (ANTIVERT) 25 MG tablet Take 1 tablet (25 mg total) by mouth 3 (three) times daily as needed for dizziness. 08/13/21   Mesner, Corene Cornea, MD  pantoprazole (PROTONIX) 20 MG tablet Take 20 mg by mouth daily before breakfast.  02/19/19   [provider]  tamsulosin (FLOMAX) 0.4 MG CAPS capsule Take 1 capsule (0.4 mg total) by mouth daily after supper. 05/11/21   Deno Etienne, DO  vitamin B-12 (CYANOCOBALAMIN) 250 MCG tablet Take 250 mcg by mouth daily.    [provider]    Physical Exam: Vitals:   01/20/22 2300 01/20/22 2330 01/21/22 0000 01/21/22 0030  BP: (!) 182/98 (!) 183/101 (!) 176/101 (!) 163/96  Pulse: 83 81 81 78  Resp: (!) 32 19 (!) 22 17  Temp:      TempSrc:      SpO2: 95% 95% 93% 93%  Weight:      Height:        Physical  Exam Vitals and nursing note reviewed.  Constitutional:      General: He is not in acute distress.    Appearance: He is obese. He is not ill-appearing, toxic-appearing or diaphoretic.  HENT:     Head: Normocephalic and atraumatic.     Nose: Nose normal.  Eyes:     General: No scleral icterus. Cardiovascular:     Rate and Rhythm: Normal rate and regular rhythm.     Pulses: Normal pulses.  Pulmonary:     Effort: Pulmonary effort is normal.     Breath sounds: Normal breath sounds. No wheezing or rales.  Abdominal:     General: Abdomen is  protuberant. Bowel sounds are normal. There is no distension.     Palpations: Abdomen is soft.     Tenderness: There is no abdominal tenderness. There is no guarding or rebound.  Musculoskeletal:     Right lower leg: No edema.     Left lower leg: No edema.  Skin:    General: Skin is warm and dry.     Capillary Refill: Capillary refill takes less than 2 seconds.  Neurological:     General: No focal deficit present.     Mental Status: He is alert and oriented to person, place, and time.      Labs on Admission: I have personally reviewed following labs and imaging studies  CBC: Recent Labs  Lab 01/20/22 2150  WBC 7.2  HGB 15.2  HCT 48.5  MCV 100.0  PLT 546   Basic Metabolic Panel: Recent Labs  Lab 01/20/22 2150  NA 143  K 4.3  CL 102  CO2 32  GLUCOSE 218*  BUN 22  CREATININE 1.33*  CALCIUM 9.1   GFR: Estimated Creatinine Clearance: 48.5 mL/min (A) (by C-G formula based on SCr of 1.33 mg/dL (H)). Liver Function Tests: No results for input(s): "AST", "ALT", "ALKPHOS", "BILITOT", "PROT", "ALBUMIN" in the last 168 hours. No results for input(s): "LIPASE", "AMYLASE" in the last 168 hours. No results for input(s): "AMMONIA" in the last 168 hours. Coagulation Profile: No results for input(s): "INR", "PROTIME" in the last 168 hours. Cardiac Enzymes: Recent Labs  Lab 01/20/22 2150  TROPONINIHS 32*   BNP (last 3 results) No  results for input(s): "PROBNP" in the last 8760 hours. HbA1C: No results for input(s): "HGBA1C" in the last 72 hours. CBG: No results for input(s): "GLUCAP" in the last 168 hours. Lipid Profile: No results for input(s): "CHOL", "HDL", "LDLCALC", "TRIG", "CHOLHDL", "LDLDIRECT" in the last 72 hours. Thyroid Function Tests: No results for input(s): "TSH", "T4TOTAL", "FREET4", "T3FREE", "THYROIDAB" in the last 72 hours. Anemia Panel: No results for input(s): "VITAMINB12", "FOLATE", "FERRITIN", "TIBC", "IRON", "RETICCTPCT" in the last 72 hours. Urine analysis:    Component Value Date/Time   COLORURINE YELLOW 08/12/2021 2308   APPEARANCEUR CLEAR 08/12/2021 2308   LABSPEC 1.020 08/12/2021 2308   PHURINE 6.0 08/12/2021 2308   GLUCOSEU 50 (A) 08/12/2021 2308   HGBUR NEGATIVE 08/12/2021 2308   BILIRUBINUR NEGATIVE 08/12/2021 2308   KETONESUR NEGATIVE 08/12/2021 2308   PROTEINUR 30 (A) 08/12/2021 2308   UROBILINOGEN 1.0 08/25/2008 0326   NITRITE NEGATIVE 08/12/2021 2308   LEUKOCYTESUR NEGATIVE 08/12/2021 2308    Radiological Exams on Admission: I have personally reviewed images DG Chest 2 View  Result Date: 01/20/2022 CLINICAL DATA:  Chest pain EXAM: CHEST - 2 VIEW COMPARISON:  08/13/2021 FINDINGS: Stable elevation of the right hemidiaphragm. Mild left basilar parenchymal scarring. No superimposed focal pulmonary infiltrate. No pneumothorax or pleural effusion. Cardiac size is within normal limits. Ventriculoperitoneal shunt catheter tubing overlies the right hemithorax, unchanged. No acute bone abnormality. IMPRESSION: No radiographic evidence of acute cardiopulmonary disease. Electronically Signed   By: Fidela Salisbury M.D.   On: 01/20/2022 22:37    EKG: My personal interpretation of EKG shows: NSR, RBBB    Assessment/Plan Principal Problem:   Chest pressure Active Problems:   HLD (hyperlipidemia)   HTN (hypertension)   Presence of ventriculopleural shunt   DMII (diabetes mellitus,  type 2) (HCC)   Chronic respiratory failure with hypoxia, on home O2 therapy (HCC) - 2 L/min prn per patient   NPH (normal pressure hydrocephalus) (  [place])    Assessment and Plan: * Chest pressure Admit to Observation cardiac bed. EDP has consulted cardiology who will see patient in AM. 2nd set of troponin not yet drawn. EDP to followup on 2nd troponin and start IV heparin if troponin trends upwards(per my verbal conversation with EDP). Continue ASA. Pt already took dose of ASA today.  NPH (normal pressure hydrocephalus) (HCC) Chronic. Stable.  Chronic respiratory failure with hypoxia, on home O2 therapy (HCC) - 2 L/min prn per patient Continue with supplemental O2.  DMII (diabetes mellitus, type 2) (HCC) Add SSI. Check A1c.  Presence of ventriculopleural shunt Chronic. Stable.  HTN (hypertension) Continue coreg 3.125 mg bid. Ordered 1 dose of IV hydralazine in ER.  HLD (hyperlipidemia) Continue zetia. Pt has reported drug allergy to lipitor.   DVT prophylaxis: SQ Heparin Code Status: Full Code Family Communication: discussed with pt and son [name] at bedside. Also discussed with pt's wife but son states pt's wife with dementia  Disposition Plan: return home  Consults called: EDP has discussed case with cardiology Rudi Rummage Admission status: Observation, Telemetry bed   Kristopher Oppenheim, DO [facility] 01/21/2022, 1:02 AM

## 2022-01-21 NOTE — Assessment & Plan Note (Signed)
Continue zetia. Pt has reported drug allergy to lipitor.

## 2022-01-21 NOTE — Assessment & Plan Note (Signed)
Continue with supplemental O2 

## 2022-01-22 DIAGNOSIS — R0789 Other chest pain: Secondary | ICD-10-CM | POA: Diagnosis not present

## 2022-01-22 DIAGNOSIS — E1165 Type 2 diabetes mellitus with hyperglycemia: Secondary | ICD-10-CM | POA: Diagnosis not present

## 2022-01-22 DIAGNOSIS — R410 Disorientation, unspecified: Secondary | ICD-10-CM

## 2022-01-22 DIAGNOSIS — D696 Thrombocytopenia, unspecified: Secondary | ICD-10-CM

## 2022-01-22 DIAGNOSIS — I10 Essential (primary) hypertension: Secondary | ICD-10-CM | POA: Diagnosis not present

## 2022-01-22 DIAGNOSIS — I214 Non-ST elevation (NSTEMI) myocardial infarction: Secondary | ICD-10-CM | POA: Diagnosis not present

## 2022-01-22 LAB — RENAL FUNCTION PANEL
Albumin: 3.3 g/dL — ABNORMAL LOW (ref 3.5–5.0)
Anion gap: 9 (ref 5–15)
BUN: 19 mg/dL (ref 8–23)
CO2: 28 mmol/L (ref 22–32)
Calcium: 8.8 mg/dL — ABNORMAL LOW (ref 8.9–10.3)
Chloride: 103 mmol/L (ref 98–111)
Creatinine, Ser: 1.21 mg/dL (ref 0.61–1.24)
GFR, Estimated: 60 mL/min — ABNORMAL LOW (ref >60)
Glucose, Bld: 159 mg/dL — ABNORMAL HIGH (ref 70–99)
Phosphorus: 3.3 mg/dL (ref 2.5–4.6)
Potassium: 4 mmol/L (ref 3.5–5.1)
Sodium: 140 mmol/L (ref 135–145)

## 2022-01-22 LAB — GLUCOSE, CAPILLARY
Glucose-Capillary: 171 mg/dL — ABNORMAL HIGH (ref 70–99)
Glucose-Capillary: 149 mg/dL — ABNORMAL HIGH (ref 70–99)
Glucose-Capillary: 149 mg/dL — ABNORMAL HIGH (ref 70–99)
Glucose-Capillary: 153 mg/dL — ABNORMAL HIGH (ref 70–99)
Glucose-Capillary: 178 mg/dL — ABNORMAL HIGH (ref 70–99)

## 2022-01-22 LAB — CBC
HCT: 46.5 % (ref 39.0–52.0)
Hemoglobin: 15.1 g/dL (ref 13.0–17.0)
MCH: 31.7 pg (ref 26.0–34.0)
MCHC: 32.5 g/dL (ref 30.0–36.0)
MCV: 97.5 fL (ref 80.0–100.0)
Platelets: 143 10*3/uL — ABNORMAL LOW (ref 150–400)
RBC: 4.77 MIL/uL (ref 4.22–5.81)
RDW: 14 % (ref 11.5–15.5)
WBC: 8.3 10*3/uL (ref 4.0–10.5)
nRBC: 0 % (ref 0.0–0.2)

## 2022-01-22 LAB — HEPARIN LEVEL (UNFRACTIONATED)
Heparin Unfractionated: 0.25 IU/mL — ABNORMAL LOW (ref 0.30–0.70)
Heparin Unfractionated: 0.22 IU/mL — ABNORMAL LOW (ref 0.30–0.70)
Heparin Unfractionated: 0.27 IU/mL — ABNORMAL LOW (ref 0.30–0.70)

## 2022-01-22 LAB — TROPONIN I (HIGH SENSITIVITY): Troponin I (High Sensitivity): 561 ng/L (ref <18)

## 2022-01-22 LAB — MAGNESIUM: Magnesium: 1.6 mg/dL — ABNORMAL LOW (ref 1.7–2.4)

## 2022-01-22 MED ORDER — CARVEDILOL 12.5 MG PO TABS
12.5000 mg | ORAL_TABLET | Freq: Two times a day (BID) | ORAL | Status: DC
  Administered 2022-01-22 – 2022-01-25 (×7): 12.5 mg via ORAL
  Filled 2022-01-22 (×7): qty 1

## 2022-01-22 MED ORDER — MAGNESIUM SULFATE 2 GM/50ML IV SOLN
2.0000 g | Freq: Once | INTRAVENOUS | Status: AC
  Administered 2022-01-22: 2 g via INTRAVENOUS
  Filled 2022-01-22: qty 50

## 2022-01-22 NOTE — Progress Notes (Signed)
ANTICOAGULATION CONSULT NOTE  Pharmacy Consult for heparin Indication: chest pain/ACS  Allergies  Allergen Reactions   Lipitor [Atorvastatin] Other (See Comments)    Hip pain    Patient Measurements: Height: '5\' 10"'$  (177.8 cm) Weight: 88.3 kg (194 lb 9.6 oz) IBW/kg (Calculated) : 73 HEPARIN DW (KG): 89.3   Vital Signs: Temp: 97.9 F (36.6 C) (09/03 1127) Temp Source: Oral (09/03 1127) BP: 149/89 (09/03 1127) Pulse Rate: 82 (09/03 1127)  Labs: Recent Labs    01/20/22 2150 01/21/22 0200 01/21/22 0928 01/21/22 1202 01/21/22 2022 01/22/22 0600 01/22/22 0909 01/22/22 1210  HGB 15.2  --   --   --   --  15.1  --   --   HCT 48.5  --   --   --   --  46.5  --   --   PLT 166  --   --   --   --  143*  --   --   HEPARINUNFRC  --   --   --   --  0.26* 0.25*  --  0.22*  CREATININE 1.33*  --   --  1.13  --  1.21  --   --   TROPONINIHS 32*   < > 654* 823*  --   --  561*  --    < > = values in this interval not displayed.     Estimated Creatinine Clearance: 52.7 mL/min (by C-G formula based on SCr of 1.21 mg/dL).   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   S/P radiation therapy 04/23/13 - 05/29/14   Prostate/seminal vesicles, external beam 4500 cGy in 25 sessions   Seasonal allergies    Skin cancer    scalp   Small bowel obstruction (HCC) 06/10/2016   Wears glasses     Medications:  Scheduled:   aspirin EC  81 mg Oral Daily   carvedilol  12.5 mg Oral BID WC   ezetimibe  10 mg Oral Daily   insulin aspart  0-15 Units Subcutaneous TID WC   insulin aspart  0-5 Units Subcutaneous QHS   insulin aspart  4 Units Subcutaneous TID WC    insulin glargine-yfgn  10 Units Subcutaneous Daily   pantoprazole  40 mg Oral QAC breakfast    heparin 1,250 Units/hr (01/22/22 0650)    Assessment: 81 YO male presenting 9/1 with chest pain, concerning for NSTEMI. Troponin 32>>823 from admission. Plan for cardiac cath on 9/5 if no other source identified for elevated troponin. No AC PTA.   Heparin level was subtherapeutic 9/3 AM at 0.25, on 1250 units/hr. Reported that patient was disconnected from his heparin IV line ~0330 9/3 AM (unknown for how long), likely causing his heparin level to be lower than expected. Continued the 1250 unit/hr rate and ordered a repeat heparin level for noon.  Noon heparin level remains subtherapeutic at 0.22, on 1250 units/hr. Hgb 15.1, plt 143. No line issues or signs/symptoms of bleeding per RN.  Goal of Therapy:  Heparin level 0.3-0.7 units/ml Monitor platelets by anticoagulation protocol: Yes   Plan:  -  Increase heparin to 1400 units/hr. -Re-check heparin level in 8hrs. -Monitor CBC, heparin level, and s/sx of bleeding daily.    Billey Gosling, PharmD PGY1 Pharmacy Resident 9/3/20231:17 PM

## 2022-01-22 NOTE — Progress Notes (Signed)
ANTICOAGULATION CONSULT NOTE  Pharmacy Consult for heparin Indication: chest pain/ACS  Allergies  Allergen Reactions   Lipitor [Atorvastatin] Other (See Comments)    Hip pain    Patient Measurements: Height: '5\' 10"'$  (177.8 cm) Weight: 88.3 kg (194 lb 9.6 oz) IBW/kg (Calculated) : 73 HEPARIN DW (KG): 89.3   Vital Signs: Temp: 98.2 F (36.8 C) (09/03 2036) Temp Source: Oral (09/03 2036) BP: 113/66 (09/03 2036) Pulse Rate: 74 (09/03 2036)  Labs: Recent Labs    01/20/22 2150 01/21/22 0200 01/21/22 0928 01/21/22 1202 01/21/22 2022 01/22/22 0600 01/22/22 0909 01/22/22 1210 01/22/22 2218  HGB 15.2  --   --   --   --  15.1  --   --   --   HCT 48.5  --   --   --   --  46.5  --   --   --   PLT 166  --   --   --   --  143*  --   --   --   HEPARINUNFRC  --   --   --   --    < > 0.25*  --  0.22* 0.27*  CREATININE 1.33*  --   --  1.13  --  1.21  --   --   --   TROPONINIHS 32*   < > 654* 823*  --   --  561*  --   --    < > = values in this interval not displayed.     Estimated Creatinine Clearance: 52.7 mL/min (by C-G formula based on SCr of 1.21 mg/dL).   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   S/P radiation therapy 04/23/13 - 05/29/14   Prostate/seminal vesicles, external beam 4500 cGy in 25 sessions   Seasonal allergies    Skin cancer    scalp   Small bowel obstruction (HCC) 06/10/2016   Wears glasses     Medications:  Scheduled:   aspirin EC  81 mg Oral Daily   carvedilol  12.5 mg Oral BID WC   ezetimibe  10 mg Oral Daily   insulin aspart  0-15 Units Subcutaneous TID WC   insulin aspart  0-5 Units Subcutaneous QHS    insulin aspart  4 Units Subcutaneous TID WC   insulin glargine-yfgn  10 Units Subcutaneous Daily   pantoprazole  40 mg Oral QAC breakfast    heparin 1,400 Units/hr (01/22/22 1451)    Assessment: 81 YO male presenting 9/1 with chest pain, concerning for NSTEMI. Troponin 32>>823 from admission. Plan for cardiac cath on 9/5 if no other source identified for elevated troponin. No AC PTA.   Heparin level was subtherapeutic: 0.27 on 1400 units/hr. No infusion issues or overt s/sx of bleeding.  Goal of Therapy:  Heparin level 0.3-0.7 units/ml Monitor platelets by anticoagulation protocol: Yes   Plan:  -Increase heparin (~2 units/kg/hr) to 1600 units/hr. -Re-check heparin level in 8hrs. -Monitor CBC, heparin level, and s/sx of bleeding daily.    Georga Bora, PharmD Clinical Pharmacist 01/22/2022 11:59 PM Please check AMION for all  [facility] Pharmacy numbers

## 2022-01-22 NOTE — Progress Notes (Signed)
CMT call to inform patient was off the monitor, He was sitting in the chair with monitor off Heparin IV line disconnected but IV intact the primo fit disconnected gown was wet. He was confused to place  and time reorient him placed leads on changed his gown and bed, assisted him and informed him that the bed alarm is now used to remind him to remain in bed until assistance has arrived. Also asked him not to disconnect his IV because it is a blood thinner. I checked his CBG to make sure that he wasn't Hypoglycemic the result was 171 call bell placed close and she showed me he knew how to use it.

## 2022-01-22 NOTE — Progress Notes (Addendum)
ANTICOAGULATION CONSULT NOTE  Pharmacy Consult for heparin Indication: chest pain/ACS  Allergies  Allergen Reactions   Lipitor [Atorvastatin] Other (See Comments)    Hip pain    Patient Measurements: Height: '5\' 10"'$  (177.8 cm) Weight: 88.3 kg (194 lb 9.6 oz) IBW/kg (Calculated) : 73 HEPARIN DW (KG): 89.3   Vital Signs: Temp: 97.7 F (36.5 C) (09/03 0358) Temp Source: Oral (09/03 0358) BP: 175/88 (09/03 0358) Pulse Rate: 80 (09/02 2109)  Labs: Recent Labs    01/20/22 2150 01/21/22 0200 01/21/22 0928 01/21/22 1202 01/21/22 2022 01/22/22 0600  HGB 15.2  --   --   --   --  15.1  HCT 48.5  --   --   --   --  46.5  PLT 166  --   --   --   --  143*  HEPARINUNFRC  --   --   --   --  0.26* 0.25*  CREATININE 1.33*  --   --  1.13  --  1.21  TROPONINIHS 32* 114* 654* 823*  --   --      Estimated Creatinine Clearance: 52.7 mL/min (by C-G formula based on SCr of 1.21 mg/dL).   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   S/P radiation therapy 04/23/13 - 05/29/14   Prostate/seminal vesicles, external beam 4500 cGy in 25 sessions   Seasonal allergies    Skin cancer    scalp   Small bowel obstruction (HCC) 06/10/2016   Wears glasses     Medications:  Scheduled:   aspirin EC  81 mg Oral Daily   carvedilol  12.5 mg Oral BID WC   ezetimibe  10 mg Oral Daily   insulin aspart  0-15 Units Subcutaneous TID WC   insulin aspart  0-5 Units Subcutaneous QHS   insulin aspart  4 Units Subcutaneous TID WC   insulin glargine-yfgn  10 Units Subcutaneous Daily   pantoprazole  40 mg Oral QAC breakfast    heparin 1,250 Units/hr (01/22/22 0650)     Assessment: 81 YO male presenting 9/1 with chest pain, concerning for NSTEMI. Troponin 32>>823 from admission. Plan for cardiac cath on 9/5 if no other source identified for elevated troponin. No AC PTA.   Heparin level today remains subtherapeutic at 0.25, on 1250 units/hr. Previous heparin level was 0.26 on 1100 units/hr. Reported that patient was disconnected from his heparin IV line ~0330 9/3 AM (unknown for how long), likely causing his heparin level to be lower than expected. Hgb 15.1, plt 143--stable.   Goal of Therapy:  Heparin level 0.3-0.7 units/ml Monitor platelets by anticoagulation protocol: Yes   Plan:  -Continue heparin 1250 units/hr. -Re-check heparin level @ 1200 as this will be ~8hrs from time pt was reconnected to heparin IV line. -Monitor CBC, heparin level, and s/sx of bleeding daily.    Billey Gosling, PharmD PGY1 Pharmacy Resident 9/3/20239:15 AM

## 2022-01-22 NOTE — Plan of Care (Signed)

## 2022-01-22 NOTE — Progress Notes (Signed)
Rounding Note    Patient Name: Jeremiah [name] Date of Encounter: 01/22/2022  [place] Cardiologist: None New Dr. Margaretann Loveless  Subjective   No additional Abd pain. Trop peak 823 and down trending.  Inpatient Medications    Scheduled Meds:  aspirin EC  81 mg Oral Daily   carvedilol  12.5 mg Oral BID WC   ezetimibe  10 mg Oral Daily   insulin aspart  0-15 Units Subcutaneous TID WC   insulin aspart  0-5 Units Subcutaneous QHS   insulin aspart  4 Units Subcutaneous TID WC   insulin glargine-yfgn  10 Units Subcutaneous Daily   pantoprazole  40 mg Oral QAC breakfast   Continuous Infusions:  heparin 1,250 Units/hr (01/22/22 0650)   magnesium sulfate bolus IVPB 2 g (01/22/22 1009)   PRN Meds: acetaminophen **OR** acetaminophen, ondansetron **OR** ondansetron (ZOFRAN) IV   Vital Signs    Vitals:   01/22/22 0024 01/22/22 0358 01/22/22 0400 01/22/22 0916  BP: (!) 169/99 (!) 175/88  (!) 167/92  Pulse:    90  Resp: 16   17  Temp: 97.7 F (36.5 C) 97.7 F (36.5 C)  97.7 F (36.5 C)  TempSrc: Oral Oral  Oral  SpO2: 94% 93%  95%  Weight:   88.3 kg   Height:        Intake/Output Summary (Last 24 hours) at 01/22/2022 1108 Last data filed at 01/22/2022 0920 Gross per 24 hour  Intake 531.29 ml  Output --  Net 531.29 ml      01/22/2022    4:00 AM 01/21/2022    3:00 AM 01/20/2022    9:48 PM  Last 3 Weights  Weight (lbs) 194 lb 9.6 oz 196 lb 14.4 oz 200 lb  Weight (kg) 88.27 kg 89.313 kg 90.719 kg      Telemetry    SR - Personally Reviewed  ECG    NSR, RBBB - Personally Reviewed  Physical Exam   GEN: No acute distress.   Neck: No JVD Cardiac: RRR, no murmurs, rubs, or gallops.  Respiratory: Clear to auscultation bilaterally. GI: Soft, nontender, non-distended  MS: No edema; No deformity. Neuro:  Nonfocal  Psych: Normal affect   Labs    High Sensitivity Troponin:   Recent Labs  Lab 01/20/22 2150 01/21/22 0200 01/21/22 0928 01/21/22 1202  01/22/22 0909  TROPONINIHS 32* 114* 654* 823* 561*     Chemistry Recent Labs  Lab 01/20/22 2150 01/21/22 1202 01/22/22 0600  NA 143 143 140  K 4.3 3.8 4.0  CL 102 101 103  CO2 32 35* 28  GLUCOSE 218* 175* 159*  BUN '22 17 19  '$ CREATININE 1.33* 1.13 1.21  CALCIUM 9.1 9.5 8.8*  MG  --   --  1.6*  PROT  --  6.1*  --   ALBUMIN  --  3.5 3.3*  AST  --  20  --   ALT  --  11  --   ALKPHOS  --  55  --   BILITOT  --  0.7  --   GFRNONAA 53* >60 60*  ANIONGAP '9 7 9    '$ Lipids  Recent Labs  Lab 01/21/22 0200  CHOL 190  TRIG 264*  HDL 40*  LDLCALC 97  CHOLHDL 4.8    Hematology Recent Labs  Lab 01/20/22 2150 01/22/22 0600  WBC 7.2 8.3  RBC 4.85 4.77  HGB 15.2 15.1  HCT 48.5 46.5  MCV 100.0 97.5  MCH 31.3 31.7  MCHC  31.3 32.5  RDW 14.1 14.0  PLT 166 143*   Thyroid No results for input(s): "TSH", "FREET4" in the last 168 hours.  BNPNo results for input(s): "BNP", "PROBNP" in the last 168 hours.  DDimer  Recent Labs  Lab 01/21/22 1441  DDIMER 0.91*     Radiology    ECHOCARDIOGRAM COMPLETE  Result Date: 01/21/2022    ECHOCARDIOGRAM REPORT   Patient Name:   Jeremiah [name] Date of Exam: 01/21/2022 Medical Rec #:  [id number]       Height:       70.0 in Accession #:    [id number]      Weight:       196.9 lb Date of Birth:  Apr 14, 1941       BSA:          2.074 m Patient Age:    81 years        BP:           174/100 mmHg Patient Gender: M               HR:           79 bpm. Exam Location:  Inpatient Procedure: 2D Echo, Cardiac Doppler and Color Doppler                              MODIFIED REPORT: This report was modified by Cherlynn Kaiser MD on 01/21/2022 due to revision.  Indications:     Chest pain  History:         Patient has prior history of Echocardiogram examinations, most                  recent 12/15/2018. Risk Factors:Sleep Apnea, Diabetes and                  Dyslipidemia.  Sonographer:     Clayton Lefort RDCS (AE) Referring Phys:  [id number] [facility] Diagnosing Phys:  Cherlynn Kaiser MD IMPRESSIONS  1. Left ventricular ejection fraction, by estimation, is 55 to 60%. The left ventricle has normal function. Left ventricular endocardial border not optimally defined to evaluate regional wall motion. Left ventricular diastolic parameters are indeterminate.  2. Right ventricular systolic function is mildly reduced with hypokinesis of the mid ventricle and relative apical sparing. The right ventricular size is mildly enlarged. IVC not assessed, cannot calculate RVSP.  3. Dilated coronary sinus.  4. The mitral valve is normal in structure. Mild mitral valve regurgitation. No evidence of mitral stenosis.  5. The aortic valve is tricuspid. Aortic valve regurgitation is mild to moderate. No aortic stenosis is present.  6. Aortic dilatation noted. 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 FINDINGS  Left Ventricle: Left ventricular ejection fraction, by estimation, is 55 to 60%. The left ventricle has normal function. Left ventricular endocardial border not optimally defined to evaluate regional wall motion. The left ventricular internal cavity size was normal in size. There is no left ventricular hypertrophy. Left ventricular diastolic parameters are indeterminate. Right Ventricle: The right ventricular size is mildly enlarged. No increase in right ventricular wall thickness. Right ventricular systolic function is mildly reduced. Tricuspid regurgitation signal is inadequate for assessing PA pressure. Left Atrium: Left atrial size was normal in size. Right Atrium: Right atrial size was normal in size. Pericardium: There is no evidence of pericardial effusion. Presence of epicardial fat layer. Mitral Valve: The mitral valve is normal in structure.  Mild mitral annular calcification. Mild mitral valve regurgitation. No evidence of mitral valve stenosis. Tricuspid Valve: The tricuspid valve is normal in structure. Tricuspid valve regurgitation is trivial. No evidence of  tricuspid stenosis. Aortic Valve: The aortic valve is tricuspid. Aortic valve regurgitation is mild to moderate. No aortic stenosis is present. Aortic valve mean gradient measures 1.0 mmHg. Aortic valve peak gradient measures 2.7 mmHg. Aortic valve area, by VTI measures 3.09 cm. Pulmonic Valve: The pulmonic valve was normal in structure. Pulmonic valve regurgitation is trivial. No evidence of pulmonic stenosis. Aorta: Aortic dilatation noted. 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 Venous: The inferior vena cava was not well visualized. IAS/Shunts: No atrial level shunt detected by color flow Doppler. Additional Comments: A is visualized in the inferior vena cava.  LEFT VENTRICLE PLAX 2D LVIDd:         4.30 cm   Diastology LVIDs:         2.70 cm   LV e' medial:    3.26 cm/s LV PW:         1.50 cm   LV E/e' medial:  7.3 LV IVS:        1.60 cm   LV e' lateral:   2.83 cm/s LVOT diam:     2.10 cm   LV E/e' lateral: 8.4 LV SV:         49 LV SV Index:   24 LVOT Area:     3.46 cm  RIGHT VENTRICLE TAPSE (M-mode): 0.9 cm LEFT ATRIUM           Index LA diam:      2.90 cm 1.40 cm/m LA Vol (A4C): 33.5 ml 16.16 ml/m  AORTIC VALVE AV Area (Vmax):    3.04 cm AV Area (Vmean):   3.00 cm AV Area (VTI):     3.09 cm AV Vmax:           82.70 cm/s AV Vmean:          55.300 cm/s AV VTI:            0.159 m AV Peak Grad:      2.7 mmHg AV Mean Grad:      1.0 mmHg LVOT Vmax:         72.50 cm/s LVOT Vmean:        47.900 cm/s LVOT VTI:          0.142 m LVOT/AV VTI ratio: 0.89  AORTA Ao Root diam: 4.20 cm Ao Asc diam:  3.90 cm MITRAL VALVE MV Area (PHT): 7.44 cm    SHUNTS MV Decel Time: 102 msec    Systemic VTI:  0.14 m MV E velocity: 23.70 cm/s  Systemic Diam: 2.10 cm MV A velocity: 77.10 cm/s MV E/A ratio:  0.31 Cherlynn Kaiser MD Electronically signed by Cherlynn Kaiser MD Signature Date/Time: 01/21/2022/1:07:33 PM    Final (Updated)    DG Chest 2 View  Result Date: 01/20/2022 CLINICAL DATA:  Chest pain EXAM:  CHEST - 2 VIEW COMPARISON:  08/13/2021 FINDINGS: Stable elevation of the right hemidiaphragm. Mild left basilar parenchymal scarring. No superimposed focal pulmonary infiltrate. No pneumothorax or pleural effusion. Cardiac size is within normal limits. Ventriculoperitoneal shunt catheter tubing overlies the right hemithorax, unchanged. No acute bone abnormality. IMPRESSION: No radiographic evidence of acute cardiopulmonary disease. Electronically Signed   By: Fidela Salisbury M.D.   On: 01/20/2022 22:37    Cardiac Studies   Echo above  Patient Profile  81 y.o. 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, type 2 diabetes, chronic respiratory failure on home oxygen, hyperlipidemia, hypertension who is being seen for the evaluation of epigastric pain radiating to the chest per admission notes  Assessment & Plan    Principal Problem:   Chest pressure Active Problems:   HLD (hyperlipidemia)   HTN (hypertension)   Presence of ventriculopleural shunt   DMII (diabetes mellitus, type 2) (HCC)   Chronic respiratory failure with hypoxia, on home O2 therapy (HCC) - 2 L/min prn per patient   NPH (normal pressure hydrocephalus) (HCC)  Epigastric pain Elevated tropoinin - concern for NSTEMI with anginal equivalent.  - no significant disease on prior cath 2020, indeterminate etiology of elevated trop however he tells me his wife of 38 years has been diagnosed with dementia a few years ago. It has worsened to the point where she does not recall "that they ever had a wedding and got married" and this conversation occurred a few days ago. The patient wonders if the stress he feels over this could have caused his presentation. We discussed that stress is a potent trigger for angina.  - Normal LFTs and lipase, likely anginal equivalent abdominal pain. - will plan for cardiac cath on Tuesday sept 5 - needs consent. - there is mid RV hypokinesis on echo with relative apical sparing,  but clinical presentation seems less consistent with hemodynamically significant PE. D-dimer is non negative. Could consider CTA for PE if no obstructive CAD.  - cont asa 81 mg daily, carvedilol (dose increased), zetia, insulin.  - heparin gtt - pt in agreement with plan.    For questions or updates, please contact [place] Please consult [email or web address] for contact info under        Signed, Elouise Munroe, MD  01/22/2022, 11:08 AM

## 2022-01-22 NOTE — Progress Notes (Signed)
PROGRESS NOTE  Jeremiah [name] MBW:[id number] DOB: Mar 09, 1941   PCP: [name], MD  Patient is from: Home.  Lives with his wife.  Independently ambulates at baseline.  DOA: 01/20/2022 LOS: 1  Chief complaints Chief Complaint  Patient presents with   Chest Pain     Brief Narrative / Interim history: 81 year old M with PMH of NPH/VP shunt, DM-2, chronic RF on 2 L, HTN and HLD presenting with 2 days history of chest/epigastric pressure sensation.  Patient denies exertional chest pain, dyspnea, SOB, DOE, diaphoresis, nausea, vomiting or fever.  In ED, elevated BP to 196/111.  Troponin 32.  EKG with NSR and RBBB.  Cardiology consulted and recommended admission.  Patient remained chest pain-free since admission.  Troponin peaked at 823 and trended down.  TTE with LVEF of 55 to 60% but not able to assess RWMA or diastolic parameters.  Patient is on IV heparin.  Plan for [facility] on 9/5.  Cardiology following.   Subjective: Seen and examined earlier this morning.  Patient had an episode of delirium last night.  Feels well this morning.  Denies chest pain, dyspnea, GI or UTI symptoms.  Objective: Vitals:   01/22/22 0358 01/22/22 0400 01/22/22 0916 01/22/22 1127  BP: (!) 175/88  (!) 167/92 (!) 149/89  Pulse:   90 82  Resp:   17 18  Temp: 97.7 F (36.5 C)  97.7 F (36.5 C) 97.9 F (36.6 C)  TempSrc: Oral  Oral Oral  SpO2: 93%  95% 93%  Weight:  88.3 kg    Height:        Examination:  GENERAL: No apparent distress.  Nontoxic. HEENT: MMM.  Vision and hearing grossly intact.  NECK: Supple.  No apparent JVD.  RESP:  No IWOB.  Fair aeration bilaterally. CVS:  RRR. Heart sounds normal.  ABD/GI/GU: BS+. Abd soft, NTND.  MSK/EXT:  Moves extremities. No apparent deformity. No edema.  SKIN: no apparent skin lesion or wound NEURO: Awake and alert. Oriented appropriately.  No apparent focal neuro deficit. PSYCH: Calm. Normal affect.   Procedures:  None  Microbiology  summarized: None  Assessment and plan: Principal Problem:   Chest pressure Active Problems:   HLD (hyperlipidemia)   HTN (hypertension)   Presence of ventriculopleural shunt   DMII (diabetes mellitus, type 2) (HCC)   Chronic respiratory failure with hypoxia, on home O2 therapy (HCC) - 2 L/min prn per patient   NPH (normal pressure hydrocephalus) (HCC)  Non-STEMI: Patient denies exertional CP or DOE.  Chest/epigastric discomfort seems to have resolved since admission.  However, troponin 32 >>823> 500.  EKG without acute ischemic finding.  TTE with normal LVEF but not able to assess RWMA or diastolic function.  Cardiopulmonary exam reassuring. -Cardiology following -Continue IV heparin -Plan for [facility] on 9/5 -Increase Coreg to 12.5 mg twice daily for better BP control -Continue Protonix -Continue low-dose aspirin and Zetia.  History of statin intolerance.  Uncontrolled hypertension: BP elevated but improved. -Increase home Coreg to 12.5 mg twice daily -Further titration as appropriate  NPH (normal pressure hydrocephalus)/VP shunt -Chronic. Stable.   Chronic respiratory failure with hypoxia: Stable.  Uses 2 L as needed. -Continue home oxygen as needed   Uncontrolled NIDDM-2 with hyperglycemia and CKD-3B: A1c 7.9%.  On low-dose metformin at home. Recent Labs  Lab 01/21/22 1614 01/21/22 2106 01/22/22 0320 01/22/22 0645 01/22/22 1125  GLUCAP 215* 144* 171* 149* 149*  -Continue SSI-moderate -Continue NovoLog 4 units 3 times daily with meals -Continue Semglee 10 units  daily -Further adjustment as appropriate  CKD-3B?:  Creatinine better than baseline. Recent Labs    05/11/21 0237 05/12/21 0112 05/13/21 0105 05/27/21 1146 05/28/21 0601 05/30/21 0516 08/12/21 2110 01/20/22 2150 01/21/22 1202 01/22/22 0600  BUN 35* 42* 36* 37* 45* 30* 24* '22 17 19  '$ CREATININE 2.62* 2.92* 2.10* 1.80* 1.97* 1.35* 1.63* 1.33* 1.13 1.21  -Continue monitoring  HLD: LDL 97.  History of  statin intolerance. -Continue zetia.  Hospital delirium -Reorientation and delirium precautions.  Mild thrombocytopenia: -Continue monitoring    Body mass index is 27.92 kg/m.          DVT prophylaxis:  SCDs Start: 01/21/22 0116  Code Status: Full code Family Communication: None at bedside Level of care: Telemetry Cardiac Status is: Inpatient The patient will remain inpatient because: Non-STEMI and uncontrolled hypertension   Final disposition: Home once medically cleared Consultants:  Cardiology  Sch Meds:  Scheduled Meds:  aspirin EC  81 mg Oral Daily   carvedilol  12.5 mg Oral BID WC   ezetimibe  10 mg Oral Daily   insulin aspart  0-15 Units Subcutaneous TID WC   insulin aspart  0-5 Units Subcutaneous QHS   insulin aspart  4 Units Subcutaneous TID WC   insulin glargine-yfgn  10 Units Subcutaneous Daily   pantoprazole  40 mg Oral QAC breakfast   Continuous Infusions:  heparin 1,400 Units/hr (01/22/22 1451)   PRN Meds:.acetaminophen **OR** acetaminophen, ondansetron **OR** ondansetron (ZOFRAN) IV  Antimicrobials: Anti-infectives (From admission, onward)    None        I have personally reviewed the following labs and images: CBC: Recent Labs  Lab 01/20/22 2150 01/22/22 0600  WBC 7.2 8.3  HGB 15.2 15.1  HCT 48.5 46.5  MCV 100.0 97.5  PLT 166 143*   BMP &GFR Recent Labs  Lab 01/20/22 2150 01/21/22 1202 01/22/22 0600  NA 143 143 140  K 4.3 3.8 4.0  CL 102 101 103  CO2 32 35* 28  GLUCOSE 218* 175* 159*  BUN '22 17 19  '$ CREATININE 1.33* 1.13 1.21  CALCIUM 9.1 9.5 8.8*  MG  --   --  1.6*  PHOS  --   --  3.3   Estimated Creatinine Clearance: 52.7 mL/min (by C-G formula based on SCr of 1.21 mg/dL). Liver & Pancreas: Recent Labs  Lab 01/21/22 1202 01/22/22 0600  AST 20  --   ALT 11  --   ALKPHOS 55  --   BILITOT 0.7  --   PROT 6.1*  --   ALBUMIN 3.5 3.3*   Recent Labs  Lab 01/21/22 1202  LIPASE 35   No results for input(s):  "AMMONIA" in the last 168 hours. Diabetic: Recent Labs    01/21/22 0200  HGBA1C 7.9*   Recent Labs  Lab 01/21/22 1614 01/21/22 2106 01/22/22 0320 01/22/22 0645 01/22/22 1125  GLUCAP 215* 144* 171* 149* 149*   Cardiac Enzymes: No results for input(s): "CKTOTAL", "CKMB", "CKMBINDEX", "TROPONINI" in the last 168 hours. No results for input(s): "PROBNP" in the last 8760 hours. Coagulation Profile: No results for input(s): "INR", "PROTIME" in the last 168 hours. Thyroid Function Tests: No results for input(s): "TSH", "T4TOTAL", "FREET4", "T3FREE", "THYROIDAB" in the last 72 hours. Lipid Profile: Recent Labs    01/21/22 0200  CHOL 190  HDL 40*  LDLCALC 97  TRIG 264*  CHOLHDL 4.8   Anemia Panel: No results for input(s): "VITAMINB12", "FOLATE", "FERRITIN", "TIBC", "IRON", "RETICCTPCT" in the last 72 hours. Urine analysis:  Component Value Date/Time   COLORURINE YELLOW 08/12/2021 2308   APPEARANCEUR CLEAR 08/12/2021 2308   LABSPEC 1.020 08/12/2021 2308   PHURINE 6.0 08/12/2021 2308   GLUCOSEU 50 (A) 08/12/2021 2308   HGBUR NEGATIVE 08/12/2021 2308   BILIRUBINUR NEGATIVE 08/12/2021 2308   KETONESUR NEGATIVE 08/12/2021 2308   PROTEINUR 30 (A) 08/12/2021 2308   UROBILINOGEN 1.0 08/25/2008 0326   NITRITE NEGATIVE 08/12/2021 2308   LEUKOCYTESUR NEGATIVE 08/12/2021 2308   Sepsis Labs: Invalid input(s): "PROCALCITONIN", "LACTICIDVEN"  Microbiology: No results found for this or any previous visit (from the past 240 hour(s)).  Radiology Studies: No results found.    Chimere Klingensmith T. [place]  If 7PM-7AM, please contact night-coverage [email or web address] 01/22/2022, 3:04 PM

## 2022-01-23 DIAGNOSIS — R0789 Other chest pain: Secondary | ICD-10-CM | POA: Diagnosis not present

## 2022-01-23 DIAGNOSIS — I10 Essential (primary) hypertension: Secondary | ICD-10-CM | POA: Diagnosis not present

## 2022-01-23 DIAGNOSIS — E1165 Type 2 diabetes mellitus with hyperglycemia: Secondary | ICD-10-CM | POA: Diagnosis not present

## 2022-01-23 DIAGNOSIS — I214 Non-ST elevation (NSTEMI) myocardial infarction: Secondary | ICD-10-CM | POA: Diagnosis not present

## 2022-01-23 LAB — RENAL FUNCTION PANEL
Albumin: 3.1 g/dL — ABNORMAL LOW (ref 3.5–5.0)
Anion gap: 6 (ref 5–15)
BUN: 27 mg/dL — ABNORMAL HIGH (ref 8–23)
CO2: 32 mmol/L (ref 22–32)
Calcium: 8.7 mg/dL — ABNORMAL LOW (ref 8.9–10.3)
Chloride: 105 mmol/L (ref 98–111)
Creatinine, Ser: 1.38 mg/dL — ABNORMAL HIGH (ref 0.61–1.24)
GFR, Estimated: 51 mL/min — ABNORMAL LOW (ref >60)
Glucose, Bld: 142 mg/dL — ABNORMAL HIGH (ref 70–99)
Phosphorus: 4.3 mg/dL (ref 2.5–4.6)
Potassium: 3.8 mmol/L (ref 3.5–5.1)
Sodium: 143 mmol/L (ref 135–145)

## 2022-01-23 LAB — GLUCOSE, CAPILLARY
Glucose-Capillary: 129 mg/dL — ABNORMAL HIGH (ref 70–99)
Glucose-Capillary: 152 mg/dL — ABNORMAL HIGH (ref 70–99)
Glucose-Capillary: 160 mg/dL — ABNORMAL HIGH (ref 70–99)
Glucose-Capillary: 190 mg/dL — ABNORMAL HIGH (ref 70–99)

## 2022-01-23 LAB — CBC
HCT: 46.7 % (ref 39.0–52.0)
Hemoglobin: 14.6 g/dL (ref 13.0–17.0)
MCH: 30.7 pg (ref 26.0–34.0)
MCHC: 31.3 g/dL (ref 30.0–36.0)
MCV: 98.1 fL (ref 80.0–100.0)
Platelets: 143 10*3/uL — ABNORMAL LOW (ref 150–400)
RBC: 4.76 MIL/uL (ref 4.22–5.81)
RDW: 14.1 % (ref 11.5–15.5)
WBC: 8.1 10*3/uL (ref 4.0–10.5)
nRBC: 0 % (ref 0.0–0.2)

## 2022-01-23 LAB — MAGNESIUM: Magnesium: 2.2 mg/dL (ref 1.7–2.4)

## 2022-01-23 LAB — HEPARIN LEVEL (UNFRACTIONATED)
Heparin Unfractionated: 0.46 IU/mL (ref 0.30–0.70)
Heparin Unfractionated: 0.46 IU/mL (ref 0.30–0.70)

## 2022-01-23 MED ORDER — SODIUM CHLORIDE 0.9 % WEIGHT BASED INFUSION
3.0000 mL/kg/h | INTRAVENOUS | Status: DC
  Administered 2022-01-24: 3 mL/kg/h via INTRAVENOUS

## 2022-01-23 MED ORDER — SODIUM CHLORIDE 0.9 % IV SOLN
250.0000 mL | INTRAVENOUS | Status: DC | PRN
Start: 2022-01-23 — End: 2022-01-24

## 2022-01-23 MED ORDER — SODIUM CHLORIDE 0.9 % WEIGHT BASED INFUSION
1.0000 mL/kg/h | INTRAVENOUS | Status: DC
  Administered 2022-01-24 (×2): 1 mL/kg/h via INTRAVENOUS

## 2022-01-23 MED ORDER — LACTATED RINGERS IV SOLN: INTRAVENOUS | Status: AC

## 2022-01-23 MED ORDER — SODIUM CHLORIDE 0.9% FLUSH: 3.0000 mL | INTRAVENOUS | Status: DC | PRN

## 2022-01-23 MED ORDER — SODIUM CHLORIDE 0.9% FLUSH
3.0000 mL | Freq: Two times a day (BID) | INTRAVENOUS | Status: DC
  Administered 2022-01-23 – 2022-01-24 (×4): 3 mL via INTRAVENOUS

## 2022-01-23 NOTE — Progress Notes (Addendum)
[place] for heparin Indication: chest pain/ACS  Allergies  Allergen Reactions   Lipitor [Atorvastatin] Other (See Comments)    Hip pain    Patient Measurements: Height: '5\' 10"'$  (177.8 cm) Weight: 88.6 kg (195 lb 5.2 oz) IBW/kg (Calculated) : 73 HEPARIN DW (KG): 89.3   Vital Signs: Temp: 97.8 F (36.6 C) (09/04 0405) Temp Source: Oral (09/04 0405) BP: 128/67 (09/04 0405) Pulse Rate: 81 (09/04 0405)  Labs: Recent Labs    01/20/22 2150 01/21/22 0200 01/21/22 0928 01/21/22 1202 01/21/22 2022 01/22/22 0600 01/22/22 0909 01/22/22 1210 01/22/22 2218 01/23/22 0224 01/23/22 0734  HGB 15.2  --   --   --   --  15.1  --   --   --  14.6  --   HCT 48.5  --   --   --   --  46.5  --   --   --  46.7  --   PLT 166  --   --   --   --  143*  --   --   --  143*  --   HEPARINUNFRC  --   --   --   --    < > 0.25*  --  0.22* 0.27*  --  0.46  CREATININE 1.33*  --   --  1.13  --  1.21  --   --   --  1.38*  --   TROPONINIHS 32*   < > 654* 823*  --   --  561*  --   --   --   --    < > = values in this interval not displayed.     Estimated Creatinine Clearance: 46.2 mL/min (A) (by C-G formula based on SCr of 1.38 mg/dL (H)).   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   S/P radiation therapy 04/23/13 - 05/29/14   Prostate/seminal vesicles, external beam 4500 cGy in 25 sessions   Seasonal allergies    Skin cancer    scalp   Small bowel obstruction (HCC) 06/10/2016   Wears glasses     Medications:  Scheduled:   aspirin EC  81 mg Oral Daily   carvedilol  12.5 mg Oral BID WC   ezetimibe  10 mg Oral Daily    insulin aspart  0-15 Units Subcutaneous TID WC   insulin aspart  0-5 Units Subcutaneous QHS   insulin aspart  4 Units Subcutaneous TID WC   insulin glargine-yfgn  10 Units Subcutaneous Daily   pantoprazole  40 mg Oral QAC breakfast   sodium chloride flush  3 mL Intravenous Q12H    heparin 1,600 Units/hr (01/23/22 0036)   lactated ringers      Assessment: 81 YO male presenting 9/1 with chest pain, concerning for NSTEMI. Troponin 32>>823 from admission. Plan for cardiac cath on 9/5 if no other source identified for elevated troponin. No AC PTA.   Heparin level therapeutic at 0.46 after increase to 1600 units/hr. Hgb 14.6 and PLTs 143 stable. No infusion issues or s/sx bleeding noted.  Goal of Therapy:  Heparin level 0.3-0.7 units/ml Monitor platelets by anticoagulation protocol: Yes   Plan:  Continue heparin at 1600 units/hr. Check confirmatory 8 hr heparin level  Monitor CBC, heparin level, and s/sx of bleeding daily F/u cath plans   Eliseo Gum, PharmD PGY1 Pharmacy Resident  01/23/2022  8:29 AM

## 2022-01-23 NOTE — Progress Notes (Signed)
Rounding Note    Patient Name: Jeremiah [name] Date of Encounter: 01/23/2022  [place] Cardiologist: Kale Rondeau [name] MD  Subjective   No additional Abd pain. No chest pain.  Inpatient Medications    Scheduled Meds:  aspirin EC  81 mg Oral Daily   carvedilol  12.5 mg Oral BID WC   ezetimibe  10 mg Oral Daily   insulin aspart  0-15 Units Subcutaneous TID WC   insulin aspart  0-5 Units Subcutaneous QHS   insulin aspart  4 Units Subcutaneous TID WC   insulin glargine-yfgn  10 Units Subcutaneous Daily   pantoprazole  40 mg Oral QAC breakfast   Continuous Infusions:  heparin 1,600 Units/hr (01/23/22 0036)   lactated ringers     PRN Meds: acetaminophen **OR** acetaminophen, ondansetron **OR** ondansetron (ZOFRAN) IV   Vital Signs    Vitals:   01/22/22 1127 01/22/22 1615 01/22/22 2036 01/23/22 0405  BP: (!) 149/89 134/82 113/66 128/67  Pulse: 82 80 74 81  Resp: '18 18 16 16  '$ Temp: 97.9 F (36.6 C)  98.2 F (36.8 C) 97.8 F (36.6 C)  TempSrc: Oral  Oral Oral  SpO2: 93% 95% 91% 90%  Weight:    88.6 kg  Height:        Intake/Output Summary (Last 24 hours) at 01/23/2022 0752 Last data filed at 01/23/2022 0406 Gross per 24 hour  Intake 720 ml  Output 650 ml  Net 70 ml      01/23/2022    4:05 AM 01/22/2022    4:00 AM 01/21/2022    3:00 AM  Last 3 Weights  Weight (lbs) 195 lb 5.2 oz 194 lb 9.6 oz 196 lb 14.4 oz  Weight (kg) 88.6 kg 88.27 kg 89.313 kg      Telemetry    SR - Personally Reviewed  ECG    NSR, RBBB - Personally Reviewed  Physical Exam   GEN: No acute distress.   Neck: No JVD Cardiac: RRR, no murmurs, rubs, or gallops.  Respiratory: Clear to auscultation bilaterally. GI: Soft, nontender, non-distended  MS: No edema; No deformity. Neuro:  Nonfocal  Psych: Normal affect   Labs    High Sensitivity Troponin:   Recent Labs  Lab 01/20/22 2150 01/21/22 0200 01/21/22 0928 01/21/22 1202 01/22/22 0909  TROPONINIHS 32* 114* 654* 823*  561*     Chemistry Recent Labs  Lab 01/21/22 1202 01/22/22 0600 01/23/22 0224  NA 143 140 143  K 3.8 4.0 3.8  CL 101 103 105  CO2 35* 28 32  GLUCOSE 175* 159* 142*  BUN 17 19 27*  CREATININE 1.13 1.21 1.38*  CALCIUM 9.5 8.8* 8.7*  MG  --  1.6* 2.2  PROT 6.1*  --   --   ALBUMIN 3.5 3.3* 3.1*  AST 20  --   --   ALT 11  --   --   ALKPHOS 55  --   --   BILITOT 0.7  --   --   GFRNONAA >60 60* 51*  ANIONGAP '7 9 6    '$ Lipids  Recent Labs  Lab 01/21/22 0200  CHOL 190  TRIG 264*  HDL 40*  LDLCALC 97  CHOLHDL 4.8    Hematology Recent Labs  Lab 01/20/22 2150 01/22/22 0600 01/23/22 0224  WBC 7.2 8.3 8.1  RBC 4.85 4.77 4.76  HGB 15.2 15.1 14.6  HCT 48.5 46.5 46.7  MCV 100.0 97.5 98.1  MCH 31.3 31.7 30.7  MCHC 31.3 32.5 31.3  RDW 14.1 14.0 14.1  PLT 166 143* 143*   Thyroid No results for input(s): "TSH", "FREET4" in the last 168 hours.  BNPNo results for input(s): "BNP", "PROBNP" in the last 168 hours.  DDimer  Recent Labs  Lab 01/21/22 1441  DDIMER 0.91*     Radiology    ECHOCARDIOGRAM COMPLETE  Result Date: 01/21/2022    ECHOCARDIOGRAM REPORT   Patient Name:   Jeremiah [name] Date of Exam: 01/21/2022 Medical Rec #:  [id number]       Height:       70.0 in Accession #:    [id number]      Weight:       196.9 lb Date of Birth:  [birth year]       BSA:          2.074 m Patient Age:    81 years        BP:           174/100 mmHg Patient Gender: M               HR:           79 bpm. Exam Location:  Inpatient Procedure: 2D Echo, Cardiac Doppler and Color Doppler                              MODIFIED REPORT: This report was modified by Cherlynn Kaiser MD on 01/21/2022 due to revision.  Indications:     Chest pain  History:         Patient has prior history of Echocardiogram examinations, most                  recent 12/15/2018. Risk Factors:Sleep Apnea, Diabetes and                  Dyslipidemia.  Sonographer:     Clayton Lefort RDCS (AE) Referring Phys:  [id number] [facility] Diagnosing  Phys: Cherlynn Kaiser MD IMPRESSIONS  1. Left ventricular ejection fraction, by estimation, is 55 to 60%. The left ventricle has normal function. Left ventricular endocardial border not optimally defined to evaluate regional wall motion. Left ventricular diastolic parameters are indeterminate.  2. Right ventricular systolic function is mildly reduced with hypokinesis of the mid ventricle and relative apical sparing. The right ventricular size is mildly enlarged. IVC not assessed, cannot calculate RVSP.  3. Dilated coronary sinus.  4. The mitral valve is normal in structure. Mild mitral valve regurgitation. No evidence of mitral stenosis.  5. The aortic valve is tricuspid. Aortic valve regurgitation is mild to moderate. No aortic stenosis is present.  6. Aortic dilatation noted. 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 FINDINGS  Left Ventricle: Left ventricular ejection fraction, by estimation, is 55 to 60%. The left ventricle has normal function. Left ventricular endocardial border not optimally defined to evaluate regional wall motion. The left ventricular internal cavity size was normal in size. There is no left ventricular hypertrophy. Left ventricular diastolic parameters are indeterminate. Right Ventricle: The right ventricular size is mildly enlarged. No increase in right ventricular wall thickness. Right ventricular systolic function is mildly reduced. Tricuspid regurgitation signal is inadequate for assessing PA pressure. Left Atrium: Left atrial size was normal in size. Right Atrium: Right atrial size was normal in size. Pericardium: There is no evidence of pericardial effusion. Presence of epicardial fat layer. Mitral Valve: The mitral valve is normal in structure. Mild  mitral annular calcification. Mild mitral valve regurgitation. No evidence of mitral valve stenosis. Tricuspid Valve: The tricuspid valve is normal in structure. Tricuspid valve regurgitation is trivial. No evidence of  tricuspid stenosis. Aortic Valve: The aortic valve is tricuspid. Aortic valve regurgitation is mild to moderate. No aortic stenosis is present. Aortic valve mean gradient measures 1.0 mmHg. Aortic valve peak gradient measures 2.7 mmHg. Aortic valve area, by VTI measures 3.09 cm. Pulmonic Valve: The pulmonic valve was normal in structure. Pulmonic valve regurgitation is trivial. No evidence of pulmonic stenosis. Aorta: Aortic dilatation noted. 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 Venous: The inferior vena cava was not well visualized. IAS/Shunts: No atrial level shunt detected by color flow Doppler. Additional Comments: A is visualized in the inferior vena cava.  LEFT VENTRICLE PLAX 2D LVIDd:         4.30 cm   Diastology LVIDs:         2.70 cm   LV e' medial:    3.26 cm/s LV PW:         1.50 cm   LV E/e' medial:  7.3 LV IVS:        1.60 cm   LV e' lateral:   2.83 cm/s LVOT diam:     2.10 cm   LV E/e' lateral: 8.4 LV SV:         49 LV SV Index:   24 LVOT Area:     3.46 cm  RIGHT VENTRICLE TAPSE (M-mode): 0.9 cm LEFT ATRIUM           Index LA diam:      2.90 cm 1.40 cm/m LA Vol (A4C): 33.5 ml 16.16 ml/m  AORTIC VALVE AV Area (Vmax):    3.04 cm AV Area (Vmean):   3.00 cm AV Area (VTI):     3.09 cm AV Vmax:           82.70 cm/s AV Vmean:          55.300 cm/s AV VTI:            0.159 m AV Peak Grad:      2.7 mmHg AV Mean Grad:      1.0 mmHg LVOT Vmax:         72.50 cm/s LVOT Vmean:        47.900 cm/s LVOT VTI:          0.142 m LVOT/AV VTI ratio: 0.89  AORTA Ao Root diam: 4.20 cm Ao Asc diam:  3.90 cm MITRAL VALVE MV Area (PHT): 7.44 cm    SHUNTS MV Decel Time: 102 msec    Systemic VTI:  0.14 m MV E velocity: 23.70 cm/s  Systemic Diam: 2.10 cm MV A velocity: 77.10 cm/s MV E/A ratio:  0.31 Cherlynn Kaiser MD Electronically signed by Cherlynn Kaiser MD Signature Date/Time: 01/21/2022/1:07:33 PM    Final (Updated)     Cardiac Studies   Echo above  Patient Profile     81 y.o. 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 Troponin peak 823. No acute Ecg changes and Echo shows Normal EF.  - minor nonobstructive disease on prior cath 2020 - Normal LFTs and lipase, likely anginal equivalent abdominal pain. - will plan for cardiac cath on Tuesday sept 5 - The procedure and risks were reviewed including but not limited to death, myocardial infarction, stroke, arrythmias, bleeding, transfusion, emergency surgery, dye allergy, or renal dysfunction. The patient voices understanding and is agreeable to proceed.  - there is mid RV hypokinesis on echo with relative apical sparing, but clinical presentation seems less consistent with hemodynamically significant PE. D-dimer is non negative. Could consider CTA or V/Q scan for PE if no obstructive CAD. Will need to limit contrast load with CKD.  - cont asa 81 mg daily, carvedilol (dose increased), zetia, insulin.  - heparin gtt - pt in agreement with plan.    For questions or updates, please contact [place] Please consult [email or web address] for contact info under        Signed, Beckett Hickmon [name], MD  01/23/2022, 7:52 AM

## 2022-01-23 NOTE — Progress Notes (Signed)
[place] for heparin Indication: chest pain/ACS  Allergies  Allergen Reactions   Lipitor [Atorvastatin] Other (See Comments)    Hip pain    Patient Measurements: Height: '5\' 10"'$  (177.8 cm) Weight: 88.6 kg (195 lb 5.2 oz) IBW/kg (Calculated) : 73 HEPARIN DW (KG): 89.3   Vital Signs: Temp: 97.8 F (36.6 C) (09/04 0405) Temp Source: Oral (09/04 0405) BP: 128/67 (09/04 0405) Pulse Rate: 81 (09/04 0405)  Labs: Recent Labs    01/20/22 2150 01/21/22 0200 01/21/22 0928 01/21/22 1202 01/21/22 2022 01/22/22 0600 01/22/22 0909 01/22/22 1210 01/22/22 2218 01/23/22 0224 01/23/22 0734 01/23/22 1520  HGB 15.2  --   --   --   --  15.1  --   --   --  14.6  --   --   HCT 48.5  --   --   --   --  46.5  --   --   --  46.7  --   --   PLT 166  --   --   --   --  143*  --   --   --  143*  --   --   HEPARINUNFRC  --   --   --   --    < > 0.25*  --    < > 0.27*  --  0.46 0.46  CREATININE 1.33*  --   --  1.13  --  1.21  --   --   --  1.38*  --   --   TROPONINIHS 32*   < > 654* 823*  --   --  561*  --   --   --   --   --    < > = values in this interval not displayed.     Estimated Creatinine Clearance: 46.2 mL/min (A) (by C-G formula based on SCr of 1.38 mg/dL (H)).   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   S/P radiation therapy 04/23/13 - 05/29/14   Prostate/seminal vesicles, external beam 4500 cGy in 25 sessions   Seasonal allergies    Skin cancer    scalp   Small bowel obstruction (HCC) 06/10/2016   Wears glasses     Medications:  Scheduled:   aspirin EC  81 mg Oral Daily   carvedilol  12.5 mg  Oral BID WC   ezetimibe  10 mg Oral Daily   insulin aspart  0-15 Units Subcutaneous TID WC   insulin aspart  0-5 Units Subcutaneous QHS   insulin aspart  4 Units Subcutaneous TID WC   insulin glargine-yfgn  10 Units Subcutaneous Daily   pantoprazole  40 mg Oral QAC breakfast   sodium chloride flush  3 mL Intravenous Q12H    heparin 1,600 Units/hr (01/23/22 0036)   lactated ringers 65 mL/hr at 01/23/22 0843    Assessment: 81 YO male presenting 9/1 with chest pain, concerning for NSTEMI. Troponin 32>>823 from admission. Plan for cardiac cath on 9/5 if no other source identified for elevated troponin. No AC PTA.  Heparin level therapeutic again at 0.46 on 1600 units/hr.   Goal of Therapy:  Heparin level 0.3-0.7 units/ml Monitor platelets by anticoagulation protocol: Yes   Plan:  Continue heparin at 1600 units/hr. Daily heparin levels Monitor CBC, heparin level, and s/sx of bleeding daily F/u cath plans - planning for 01/24/22  Wilson Singer, PharmD Clinical Pharmacist 01/23/2022 3:53 PM

## 2022-01-23 NOTE — Progress Notes (Signed)
Mobility Specialist Progress Note:   01/23/22 1148  Mobility  Activity Transferred to/from [facility]  Level of Assistance Standby assist, set-up cues, supervision of patient - no hands on  Assistive Device  (HHA)  Distance Ambulated (ft) 4 ft  Activity Response Tolerated well  $Mobility charge 1 Mobility   Pt received in chair needing help cleaning up. Pt had some BM on the floor. Assisted pt to [facility] and he was able to have BM. Left in chair with call bell in reach and all needs met.   [facility] Mobility Specialist

## 2022-01-23 NOTE — Progress Notes (Signed)
PROGRESS NOTE  Jeremiah [name] WUJ:[id number] DOB: [birth year]   PCP: [name], MD  Patient is from: Home.  Lives with his wife.  Independently ambulates at baseline.  DOA: 01/20/2022 LOS: 2  Chief complaints Chief Complaint  Patient presents with   Chest Pain     Brief Narrative / Interim history: 81 year old M with PMH of NPH/VP shunt, DM-2, chronic RF on 2 L, HTN and HLD presenting with 2 days history of chest/epigastric pressure sensation.  Patient denies exertional chest pain, dyspnea, SOB, DOE, diaphoresis, nausea, vomiting or fever.  In ED, elevated BP to 196/111.  Troponin 32.  EKG with NSR and RBBB.  Cardiology consulted and recommended admission.  Patient remained chest pain-free since admission.  Troponin peaked at 823 and trended down.  TTE with LVEF of 55 to 60% but not able to assess RWMA or diastolic parameters.  Patient is on IV heparin.  Plan for [facility] on 9/5.  Cardiology following.   Subjective: Seen and examined earlier this morning.  No major events overnight of this morning.  Did not have good sleep last night.  Otherwise, no complaints.  He denies chest pain, shortness of breath, GI or UTI symptoms.   Objective: Vitals:   01/22/22 1127 01/22/22 1615 01/22/22 2036 01/23/22 0405  BP: (!) 149/89 134/82 113/66 128/67  Pulse: 82 80 74 81  Resp: '18 18 16 16  '$ Temp: 97.9 F (36.6 C)  98.2 F (36.8 C) 97.8 F (36.6 C)  TempSrc: Oral  Oral Oral  SpO2: 93% 95% 91% 90%  Weight:    88.6 kg  Height:        Examination:  GENERAL: No apparent distress.  Nontoxic. HEENT: MMM.  Vision and hearing grossly intact.  NECK: Supple.  No apparent JVD.  RESP:  No IWOB.  Fair aeration bilaterally. CVS:  RRR. Heart sounds normal.  ABD/GI/GU: BS+. Abd soft, NTND.  MSK/EXT:  Moves extremities. No apparent deformity. No edema.  SKIN: no apparent skin lesion or wound NEURO: Sleepy but wakes to voice.  Oriented appropriately.  No apparent focal neuro deficit. PSYCH: Calm.  Normal affect.   Procedures:  None  Microbiology summarized: None  Assessment and plan: Principal Problem:   Chest pressure Active Problems:   HLD (hyperlipidemia)   HTN (hypertension)   Presence of ventriculopleural shunt   DMII (diabetes mellitus, type 2) (HCC)   Chronic respiratory failure with hypoxia, on home O2 therapy (HCC) - 2 L/min prn per patient   NPH (normal pressure hydrocephalus) (HCC)  Non-STEMI: Patient denies exertional CP or DOE.  Chest/epigastric discomfort seems to have resolved since admission.  However, troponin 32 >>823> 500.  EKG without acute ischemic finding.  TTE with normal LVEF but not able to assess RWMA or diastolic function.  Cardiopulmonary exam reassuring. -Cardiology following -Continue IV heparin -Plan for [facility] on 9/5 -Continue Coreg to 12.5 mg twice daily.  -Continue Protonix -Continue low-dose aspirin and Zetia.  History of statin intolerance.  Uncontrolled hypertension: Normotensive today. -Continue home Coreg to 12.5 mg twice daily -Further titration as appropriate  NPH (normal pressure hydrocephalus)/VP shunt -Chronic. Stable.   Chronic respiratory failure with hypoxia: Stable.  Uses 2 L as needed. -Continue home oxygen as needed -Incentive spirometry   Uncontrolled NIDDM-2 with hyperglycemia and CKD-3B: A1c 7.9%.  On low-dose metformin at home. Recent Labs  Lab 01/22/22 0645 01/22/22 1125 01/22/22 1618 01/22/22 2132 01/23/22 0608  GLUCAP 149* 149* 153* 178* 129*  -Continue SSI-moderate -Continue NovoLog 4 units 3  times daily with meals -Continue Semglee 10 units daily -Further adjustment as appropriate  CKD-3B?:  Cr slightly up but at baseline. Recent Labs    05/12/21 0112 05/13/21 0105 05/27/21 1146 05/28/21 0601 05/30/21 0516 08/12/21 2110 01/20/22 2150 01/21/22 1202 01/22/22 0600 01/23/22 0224  BUN 42* 36* 37* 45* 30* 24* '22 17 19 '$ 27*  CREATININE 2.92* 2.10* 1.80* 1.97* 1.35* 1.63* 1.33* 1.13 1.21 1.38*   -IV LR at 65 cc an hour for upcoming LHC -Recheck in the morning  HLD: LDL 97.  History of statin intolerance. -Continue zetia.  Hospital delirium: Fairly oriented -Reorientation and delirium precautions.  Mild thrombocytopenia: Stable -Continue monitoring  Body mass index is 28.03 kg/m.   DVT prophylaxis:  SCDs Start: 01/21/22 0116 On IV heparin for non-STEMI Code Status: Full code Family Communication: Updated patient's daughter over the phone.  Son did not answer Level of care: Telemetry Cardiac Status is: Inpatient The patient will remain inpatient because: Non-STEMI and uncontrolled hypertension   Final disposition: Home once medically cleared Consultants:  Cardiology  Sch Meds:  Scheduled Meds:  aspirin EC  81 mg Oral Daily   carvedilol  12.5 mg Oral BID WC   ezetimibe  10 mg Oral Daily   insulin aspart  0-15 Units Subcutaneous TID WC   insulin aspart  0-5 Units Subcutaneous QHS   insulin aspart  4 Units Subcutaneous TID WC   insulin glargine-yfgn  10 Units Subcutaneous Daily   pantoprazole  40 mg Oral QAC breakfast   sodium chloride flush  3 mL Intravenous Q12H   Continuous Infusions:  heparin 1,600 Units/hr (01/23/22 0036)   lactated ringers 65 mL/hr at 01/23/22 0843   PRN Meds:.acetaminophen **OR** acetaminophen, ondansetron **OR** ondansetron (ZOFRAN) IV  Antimicrobials: Anti-infectives (From admission, onward)    None        I have personally reviewed the following labs and images: CBC: Recent Labs  Lab 01/20/22 2150 01/22/22 0600 01/23/22 0224  WBC 7.2 8.3 8.1  HGB 15.2 15.1 14.6  HCT 48.5 46.5 46.7  MCV 100.0 97.5 98.1  PLT 166 143* 143*   BMP &GFR Recent Labs  Lab 01/20/22 2150 01/21/22 1202 01/22/22 0600 01/23/22 0224  NA 143 143 140 143  K 4.3 3.8 4.0 3.8  CL 102 101 103 105  CO2 32 35* 28 32  GLUCOSE 218* 175* 159* 142*  BUN '22 17 19 '$ 27*  CREATININE 1.33* 1.13 1.21 1.38*  CALCIUM 9.1 9.5 8.8* 8.7*  MG  --   --   1.6* 2.2  PHOS  --   --  3.3 4.3   Estimated Creatinine Clearance: 46.2 mL/min (A) (by C-G formula based on SCr of 1.38 mg/dL (H)). Liver & Pancreas: Recent Labs  Lab 01/21/22 1202 01/22/22 0600 01/23/22 0224  AST 20  --   --   ALT 11  --   --   ALKPHOS 55  --   --   BILITOT 0.7  --   --   PROT 6.1*  --   --   ALBUMIN 3.5 3.3* 3.1*   Recent Labs  Lab 01/21/22 1202  LIPASE 35   No results for input(s): "AMMONIA" in the last 168 hours. Diabetic: Recent Labs    01/21/22 0200  HGBA1C 7.9*   Recent Labs  Lab 01/22/22 0645 01/22/22 1125 01/22/22 1618 01/22/22 2132 01/23/22 0608  GLUCAP 149* 149* 153* 178* 129*   Cardiac Enzymes: No results for input(s): "CKTOTAL", "CKMB", "CKMBINDEX", "TROPONINI" in the last 168 hours.  No results for input(s): "PROBNP" in the last 8760 hours. Coagulation Profile: No results for input(s): "INR", "PROTIME" in the last 168 hours. Thyroid Function Tests: No results for input(s): "TSH", "T4TOTAL", "FREET4", "T3FREE", "THYROIDAB" in the last 72 hours. Lipid Profile: Recent Labs    01/21/22 0200  CHOL 190  HDL 40*  LDLCALC 97  TRIG 264*  CHOLHDL 4.8   Anemia Panel: No results for input(s): "VITAMINB12", "FOLATE", "FERRITIN", "TIBC", "IRON", "RETICCTPCT" in the last 72 hours. Urine analysis:    Component Value Date/Time   COLORURINE YELLOW 08/12/2021 2308   APPEARANCEUR CLEAR 08/12/2021 2308   LABSPEC 1.020 08/12/2021 2308   PHURINE 6.0 08/12/2021 2308   GLUCOSEU 50 (A) 08/12/2021 2308   HGBUR NEGATIVE 08/12/2021 2308   BILIRUBINUR NEGATIVE 08/12/2021 2308   KETONESUR NEGATIVE 08/12/2021 2308   PROTEINUR 30 (A) 08/12/2021 2308   UROBILINOGEN 1.0 08/25/2008 0326   NITRITE NEGATIVE 08/12/2021 2308   LEUKOCYTESUR NEGATIVE 08/12/2021 2308   Sepsis Labs: Invalid input(s): "PROCALCITONIN", "LACTICIDVEN"  Microbiology: No results found for this or any previous visit (from the past 240 hour(s)).  Radiology Studies: No results  found.    Obinna Ehresman T. R[place]  If 7PM-7AM, please contact night-coverage [email or web address] 01/23/2022, 11:11 AM

## 2022-01-24 ENCOUNTER — Other Ambulatory Visit (HOSPITAL_COMMUNITY): Payer: Self-pay

## 2022-01-24 ENCOUNTER — Telehealth (HOSPITAL_COMMUNITY): Payer: Self-pay

## 2022-01-24 ENCOUNTER — Encounter (HOSPITAL_COMMUNITY)
Admission: EM | Disposition: A | Discharge status: Home / Self Care | Payer: Self-pay | Source: Home / Self Care | Attending: Student

## 2022-01-24 DIAGNOSIS — I214 Non-ST elevation (NSTEMI) myocardial infarction: Secondary | ICD-10-CM | POA: Diagnosis not present

## 2022-01-24 DIAGNOSIS — R0789 Other chest pain: Secondary | ICD-10-CM | POA: Diagnosis not present

## 2022-01-24 DIAGNOSIS — E1165 Type 2 diabetes mellitus with hyperglycemia: Secondary | ICD-10-CM | POA: Diagnosis not present

## 2022-01-24 DIAGNOSIS — I10 Essential (primary) hypertension: Secondary | ICD-10-CM | POA: Diagnosis not present

## 2022-01-24 DIAGNOSIS — I2510 Atherosclerotic heart disease of native coronary artery without angina pectoris: Secondary | ICD-10-CM

## 2022-01-24 HISTORY — PX: LEFT HEART CATH AND CORONARY ANGIOGRAPHY: CATH118249

## 2022-01-24 HISTORY — PX: CORONARY STENT INTERVENTION: CATH118234

## 2022-01-24 LAB — RENAL FUNCTION PANEL
Albumin: 2.9 g/dL — ABNORMAL LOW (ref 3.5–5.0)
Anion gap: 8 (ref 5–15)
BUN: 27 mg/dL — ABNORMAL HIGH (ref 8–23)
CO2: 30 mmol/L (ref 22–32)
Calcium: 8.4 mg/dL — ABNORMAL LOW (ref 8.9–10.3)
Chloride: 104 mmol/L (ref 98–111)
Creatinine, Ser: 1.4 mg/dL — ABNORMAL HIGH (ref 0.61–1.24)
GFR, Estimated: 50 mL/min — ABNORMAL LOW (ref >60)
Glucose, Bld: 163 mg/dL — ABNORMAL HIGH (ref 70–99)
Phosphorus: 4.1 mg/dL (ref 2.5–4.6)
Potassium: 4 mmol/L (ref 3.5–5.1)
Sodium: 142 mmol/L (ref 135–145)

## 2022-01-24 LAB — POCT ACTIVATED CLOTTING TIME: Activated Clotting Time: 414 seconds

## 2022-01-24 LAB — CBC
HCT: 44.3 % (ref 39.0–52.0)
Hemoglobin: 14 g/dL (ref 13.0–17.0)
MCH: 31.8 pg (ref 26.0–34.0)
MCHC: 31.6 g/dL (ref 30.0–36.0)
MCV: 100.7 fL — ABNORMAL HIGH (ref 80.0–100.0)
Platelets: 134 10*3/uL — ABNORMAL LOW (ref 150–400)
RBC: 4.4 MIL/uL (ref 4.22–5.81)
RDW: 14.3 % (ref 11.5–15.5)
WBC: 9.5 10*3/uL (ref 4.0–10.5)
nRBC: 0 % (ref 0.0–0.2)

## 2022-01-24 LAB — GLUCOSE, CAPILLARY
Glucose-Capillary: 152 mg/dL — ABNORMAL HIGH (ref 70–99)
Glucose-Capillary: 158 mg/dL — ABNORMAL HIGH (ref 70–99)
Glucose-Capillary: 118 mg/dL — ABNORMAL HIGH (ref 70–99)
Glucose-Capillary: 106 mg/dL — ABNORMAL HIGH (ref 70–99)
Glucose-Capillary: 162 mg/dL — ABNORMAL HIGH (ref 70–99)

## 2022-01-24 LAB — MAGNESIUM: Magnesium: 2 mg/dL (ref 1.7–2.4)

## 2022-01-24 LAB — HEPARIN LEVEL (UNFRACTIONATED): Heparin Unfractionated: 0.55 IU/mL (ref 0.30–0.70)

## 2022-01-24 SURGERY — LEFT HEART CATH AND CORONARY ANGIOGRAPHY
Anesthesia: LOCAL

## 2022-01-24 MED ORDER — HEPARIN (PORCINE) IN NACL 1000-0.9 UT/500ML-% IV SOLN
INTRAVENOUS | Status: DC | PRN
  Administered 2022-01-24 (×2): 500 mL

## 2022-01-24 MED ORDER — CLOPIDOGREL BISULFATE 75 MG PO TABS
75.0000 mg | ORAL_TABLET | Freq: Every day | ORAL | Status: DC
  Administered 2022-01-25: 75 mg via ORAL
  Filled 2022-01-24: qty 1

## 2022-01-24 MED ORDER — NITROGLYCERIN 1 MG/10 ML FOR IR/CATH LAB
INTRA_ARTERIAL | Status: AC
  Filled 2022-01-24: qty 10

## 2022-01-24 MED ORDER — SODIUM CHLORIDE 0.9 % IV SOLN: 250.0000 mL | INTRAVENOUS | Status: DC | PRN

## 2022-01-24 MED ORDER — CLOPIDOGREL BISULFATE 300 MG PO TABS
ORAL_TABLET | ORAL | Status: AC
  Filled 2022-01-24: qty 1

## 2022-01-24 MED ORDER — HEPARIN (PORCINE) IN NACL 1000-0.9 UT/500ML-% IV SOLN
INTRAVENOUS | Status: AC
Start: 2022-01-24 — End: ?
  Filled 2022-01-24: qty 500

## 2022-01-24 MED ORDER — VERAPAMIL HCL 2.5 MG/ML IV SOLN
INTRAVENOUS | Status: DC | PRN
  Administered 2022-01-24: 10 mL via INTRA_ARTERIAL

## 2022-01-24 MED ORDER — SODIUM CHLORIDE 0.9% FLUSH: 3.0000 mL | INTRAVENOUS | Status: DC | PRN

## 2022-01-24 MED ORDER — SODIUM CHLORIDE 0.9% FLUSH
3.0000 mL | Freq: Two times a day (BID) | INTRAVENOUS | Status: DC
  Administered 2022-01-24: 3 mL via INTRAVENOUS

## 2022-01-24 MED ORDER — HEPARIN (PORCINE) IN NACL 1000-0.9 UT/500ML-% IV SOLN
INTRAVENOUS | Status: AC
  Filled 2022-01-24: qty 500

## 2022-01-24 MED ORDER — HEPARIN SODIUM (PORCINE) 1000 UNIT/ML IJ SOLN
INTRAMUSCULAR | Status: DC | PRN
  Administered 2022-01-24: 4500 [IU] via INTRAVENOUS
  Administered 2022-01-24: 4000 [IU] via INTRAVENOUS

## 2022-01-24 MED ORDER — IOHEXOL 350 MG/ML SOLN
INTRAVENOUS | Status: DC | PRN
  Administered 2022-01-24: 80 mL

## 2022-01-24 MED ORDER — HEPARIN SODIUM (PORCINE) 1000 UNIT/ML IJ SOLN
INTRAMUSCULAR | Status: AC
Start: 2022-01-24 — End: ?
  Filled 2022-01-24: qty 10

## 2022-01-24 MED ORDER — CLOPIDOGREL BISULFATE 300 MG PO TABS
ORAL_TABLET | ORAL | Status: DC | PRN
  Administered 2022-01-24: 600 mg via ORAL

## 2022-01-24 MED ORDER — SODIUM CHLORIDE 0.9 % WEIGHT BASED INFUSION
1.0000 mL/kg/h | INTRAVENOUS | Status: AC
  Administered 2022-01-24: 1 mL/kg/h via INTRAVENOUS

## 2022-01-24 MED ORDER — LIDOCAINE HCL (PF) 1 % IJ SOLN
INTRAMUSCULAR | Status: AC
  Filled 2022-01-24: qty 30

## 2022-01-24 MED ORDER — VERAPAMIL HCL 2.5 MG/ML IV SOLN
INTRAVENOUS | Status: AC
  Filled 2022-01-24: qty 2

## 2022-01-24 MED ORDER — FAMOTIDINE IN NACL 20-0.9 MG/50ML-% IV SOLN
INTRAVENOUS | Status: AC
  Filled 2022-01-24: qty 50

## 2022-01-24 MED ORDER — HEPARIN SODIUM (PORCINE) 5000 UNIT/ML IJ SOLN
5000.0000 [IU] | Freq: Three times a day (TID) | INTRAMUSCULAR | Status: DC
  Administered 2022-01-24 – 2022-01-25 (×3): 5000 [IU] via SUBCUTANEOUS
  Filled 2022-01-24 (×3): qty 1

## 2022-01-24 MED ORDER — ALBUTEROL SULFATE (2.5 MG/3ML) 0.083% IN NEBU
2.5000 mg | INHALATION_SOLUTION | Freq: Once | RESPIRATORY_TRACT | Status: AC | PRN
  Administered 2022-01-24: 2.5 mg via RESPIRATORY_TRACT
  Filled 2022-01-24: qty 3

## 2022-01-24 MED ORDER — LIDOCAINE HCL (PF) 1 % IJ SOLN
INTRAMUSCULAR | Status: DC | PRN
  Administered 2022-01-24: 2 mL

## 2022-01-24 MED ORDER — FAMOTIDINE IN NACL 20-0.9 MG/50ML-% IV SOLN
INTRAVENOUS | Status: AC | PRN
  Administered 2022-01-24: 20 mg via INTRAVENOUS

## 2022-01-24 SURGICAL SUPPLY — 17 items
BALL SAPPHIRE NC24 2.75X10 (BALLOONS) ×1
BALLN SAPPHIRE 2.0X12 (BALLOONS) ×1
BALLOON SAPPHIRE 2.0X12 (BALLOONS) IMPLANT
BALLOON SAPPHIRE NC24 2.75X10 (BALLOONS) IMPLANT
CATH 5FR JL3.5 JR4 ANG PIG MP (CATHETERS) IMPLANT
CATH LAUNCHER 6FR JR4 (CATHETERS) IMPLANT
DEVICE RAD COMP TR BAND LRG (VASCULAR PRODUCTS) IMPLANT
GLIDESHEATH SLEND SS 6F .021 (SHEATH) IMPLANT
GUIDEWIRE INQWIRE 1.5J.035X260 (WIRE) IMPLANT
INQWIRE 1.5J .035X260CM (WIRE) ×1
KIT ENCORE 26 ADVANTAGE (KITS) IMPLANT
KIT HEART LEFT (KITS) ×2 IMPLANT
PACK CARDIAC CATHETERIZATION (CUSTOM PROCEDURE TRAY) ×2 IMPLANT
STENT ONYX FRONTIER 2.5X12 (Permanent Stent) IMPLANT
TRANSDUCER W/STOPCOCK (MISCELLANEOUS) ×2 IMPLANT
TUBING CIL FLEX 10 FLL-RA (TUBING) ×2 IMPLANT
WIRE RUNTHROUGH IZANAI 014 180 (WIRE) IMPLANT

## 2022-01-24 NOTE — Progress Notes (Signed)
ANTICOAGULATION CONSULT NOTE  Pharmacy Consult for heparin Indication: chest pain/ACS  Allergies  Allergen Reactions   Lipitor [Atorvastatin] Other (See Comments)    Hip pain    Patient Measurements: Height: '5\' 10"'$  (177.8 cm) Weight: 90.3 kg (199 lb 1.2 oz) IBW/kg (Calculated) : 73 HEPARIN DW (KG): 89.3   Vital Signs: Temp: 98.1 F (36.7 C) (09/05 0325) Temp Source: Oral (09/05 0325) BP: 148/85 (09/05 0325) Pulse Rate: 86 (09/05 0325)  Labs: Recent Labs    01/21/22 1202 01/21/22 2022 01/22/22 0600 01/22/22 0909 01/22/22 1210 01/23/22 0224 01/23/22 0734 01/23/22 1520 01/24/22 0242  HGB  --    < > 15.1  --   --  14.6  --   --  14.0  HCT  --   --  46.5  --   --  46.7  --   --  44.3  PLT  --   --  143*  --   --  143*  --   --  134*  HEPARINUNFRC  --    < > 0.25*  --    < >  --  0.46 0.46 0.55  CREATININE 1.13  --  1.21  --   --  1.38*  --   --  1.40*  TROPONINIHS 823*  --   --  561*  --   --   --   --   --    < > = values in this interval not displayed.     Estimated Creatinine Clearance: 46 mL/min (A) (by C-G formula based on SCr of 1.4 mg/dL (H)).   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   S/P radiation therapy 04/23/13 - 05/29/14   Prostate/seminal vesicles, external beam 4500 cGy in 25 sessions   Seasonal allergies    Skin cancer    scalp   Small bowel obstruction (HCC) 06/10/2016   Wears glasses     Medications:  Scheduled:   aspirin EC  81 mg Oral Daily   carvedilol  12.5 mg Oral BID WC   ezetimibe  10 mg Oral Daily   insulin aspart  0-15 Units Subcutaneous TID WC   insulin aspart  0-5 Units Subcutaneous  QHS   insulin aspart  4 Units Subcutaneous TID WC   insulin glargine-yfgn  10 Units Subcutaneous Daily   pantoprazole  40 mg Oral QAC breakfast   sodium chloride flush  3 mL Intravenous Q12H    sodium chloride     sodium chloride 1 mL/kg/hr (01/24/22 0941)   heparin 1,600 Units/hr (01/23/22 0036)    Assessment: 81 YO male presenting 9/1 with chest pain, concerning for NSTEMI. Plan for cardiac cath today. No AC PTA.   Heparin level therapeutic again at 0.55 on 1600 units/hr.   Goal of Therapy:  Heparin level 0.3-0.7 units/ml Monitor platelets by anticoagulation protocol: Yes   Plan:  Continue heparin at 1600 units/hr. Will follow plans post cath  Hildred Laser, PharmD Clinical Pharmacist **Pharmacist phone directory can now be found on [email or web address] ([email or web address]).  Listed under [place].

## 2022-01-24 NOTE — Progress Notes (Signed)
   01/24/22 1142  Mobility  Activity Ambulated independently in hallway  Level of Assistance Independent  Assistive Device None  Distance Ambulated (ft) 550 ft  Activity Response Tolerated well  $Mobility charge 1 Mobility   Mobility Specialist Progress Note  Post-Mobility: 78 HR, 97/64 BP, 95% SpO2   Pt was in bed and agreeable. X1 standing break halfway d/t SOB but recovered by being coached through pursed lip breathing.   Lucious Groves Mobility Specialist

## 2022-01-24 NOTE — Progress Notes (Signed)
TR Band  TR BAND REMOVAL   LOCATION: Right radial   DEFLATED PER PROTOCOL:    Yes   TIME BAND OFF / DRESSING APPLIED:  2220     SITE UPON ARRIVAL:  Slight ooze, dried blood   SITE AFTER BAND REMOVAL:  cleansed, clean/dry/intact     CIRCULATION SENSATION AND MOVEMENT:    WDL  COMMENTS:   Care instructions to leave dressing intact for 24 hours.  Hold pressure and call RN if bleeding noticed.

## 2022-01-24 NOTE — Progress Notes (Signed)
PROGRESS NOTE  Jeremiah [name] WUJ:[id number] DOB: Feb 12, 1941   PCP: [name], MD  Patient is from: Home.  Lives with his wife.  Independently ambulates at baseline.  DOA: 01/20/2022 LOS: 3  Chief complaints Chief Complaint  Patient presents with   Chest Pain     Brief Narrative / Interim history: 81 year old M with PMH of NPH/VP shunt, DM-2, chronic RF on 2 L, HTN and HLD presenting with 2 days history of chest/epigastric pressure sensation.  Patient denies exertional chest pain, dyspnea, SOB, DOE, diaphoresis, nausea, vomiting or fever.  In ED, elevated BP to 196/111.  Troponin 32.  EKG with NSR and RBBB.  Cardiology consulted and recommended admission.  Patient remained chest pain-free since admission.  Troponin peaked at 823 and trended down.  TTE with LVEF of 55 to 60% but not able to assess RWMA or diastolic parameters.  Patient is on IV heparin.  Plan for [facility] on 9/5.     Subjective: Seen and examined earlier this morning.  No major events overnight of this morning.  No complaints.  Denies chest pain, shortness of breath, GI or UTI symptoms.  Awaiting on left heart catheterization.  Objective: Vitals:   01/23/22 2102 01/23/22 2318 01/24/22 0319 01/24/22 0325  BP:  (!) 115/52  (!) 148/85  Pulse:    86  Resp:  16  16  Temp:  97.6 F (36.4 C)  98.1 F (36.7 C)  TempSrc:  Oral  Oral  SpO2:  92%  94%  Weight: 90.4 kg  90.3 kg   Height:        Examination:  GENERAL: No apparent distress.  Nontoxic. HEENT: MMM.  Vision and hearing grossly intact.  NECK: Supple.  No apparent JVD.  RESP:  No IWOB.  Fair aeration bilaterally. CVS:  RRR. Heart sounds normal.  ABD/GI/GU: BS+. Abd soft, NTND.  MSK/EXT:  Moves extremities. No apparent deformity. No edema.  SKIN: no apparent skin lesion or wound NEURO: Awake and alert. Oriented appropriately.  No apparent focal neuro deficit. PSYCH: Calm. Normal affect.   Procedures:  None  Microbiology  summarized: None  Assessment and plan: Principal Problem:   Chest pressure Active Problems:   HLD (hyperlipidemia)   HTN (hypertension)   Presence of ventriculopleural shunt   DMII (diabetes mellitus, type 2) (HCC)   Chronic respiratory failure with hypoxia, on home O2 therapy (HCC) - 2 L/min prn per patient   NPH (normal pressure hydrocephalus) (HCC)  Non-STEMI: Patient denies exertional CP or DOE.  Chest/epigastric discomfort seems to have resolved since admission.  However, troponin 32 >>823> 500.  EKG without acute ischemic finding.  TTE with normal LVEF but not able to assess RWMA or diastolic function.  Cardiopulmonary exam reassuring. -Cardiology following and plan for LHC today -Continue IV heparin -Continue Coreg to 12.5 mg twice daily.  -Continue Protonix -Continue low-dose aspirin and Zetia.  History of statin intolerance.  Uncontrolled hypertension: BP within acceptable range for most part -Continue Coreg to 12.5 mg twice daily.  On 3.125 mg twice daily at home -Further titration as appropriate  NPH (normal pressure hydrocephalus)/VP shunt -Chronic. Stable.   Chronic respiratory failure with hypoxia: Stable.  Uses 2 L as needed. -Continue home oxygen as needed -Incentive spirometry   Uncontrolled NIDDM-2 with hyperglycemia and CKD-3B: A1c 7.9%.  On low-dose metformin at home. Recent Labs  Lab 01/23/22 0608 01/23/22 1158 01/23/22 1828 01/23/22 2058 01/24/22 0616  GLUCAP 129* 152* 160* 190* 152*  -Continue SSI-moderate -Continue NovoLog 4  units 3 times daily with meals -Continue Semglee 10 units daily -Further adjustment as appropriate  CKD-3B?:  Cr slightly up but at baseline. Recent Labs    05/13/21 0105 05/27/21 1146 05/28/21 0601 05/30/21 0516 08/12/21 2110 01/20/22 2150 01/21/22 1202 01/22/22 0600 01/23/22 0224 01/24/22 0242  BUN 36* 37* 45* 30* 24* '22 17 19 '$ 27* 27*  CREATININE 2.10* 1.80* 1.97* 1.35* 1.63* 1.33* 1.13 1.21 1.38* 1.40*   -Continue IV LR at 65 cc  -Recheck in the morning  HLD: LDL 97.  History of statin intolerance. -Continue zetia.  Hospital delirium: Fairly oriented -Reorientation and delirium precautions.  Mild thrombocytopenia: Stable -Continue monitoring  Body mass index is 28.56 kg/m.   DVT prophylaxis:  SCDs Start: 01/21/22 0116 On IV heparin for non-STEMI Code Status: Full code Family Communication: Updated patient's daughter over the phone on 9/4. Level of care: Telemetry Cardiac Status is: Inpatient The patient will remain inpatient because: Non-STEMI and uncontrolled hypertension   Final disposition: Home once medically cleared Consultants:  Cardiology  Sch Meds:  Scheduled Meds:  aspirin EC  81 mg Oral Daily   carvedilol  12.5 mg Oral BID WC   ezetimibe  10 mg Oral Daily   insulin aspart  0-15 Units Subcutaneous TID WC   insulin aspart  0-5 Units Subcutaneous QHS   insulin aspart  4 Units Subcutaneous TID WC   insulin glargine-yfgn  10 Units Subcutaneous Daily   pantoprazole  40 mg Oral QAC breakfast   sodium chloride flush  3 mL Intravenous Q12H   Continuous Infusions:  sodium chloride     sodium chloride 1 mL/kg/hr (01/24/22 0941)   heparin 1,600 Units/hr (01/23/22 0036)   PRN Meds:.sodium chloride, acetaminophen **OR** acetaminophen, ondansetron **OR** ondansetron (ZOFRAN) IV, sodium chloride flush  Antimicrobials: Anti-infectives (From admission, onward)    None        I have personally reviewed the following labs and images: CBC: Recent Labs  Lab 01/20/22 2150 01/22/22 0600 01/23/22 0224 01/24/22 0242  WBC 7.2 8.3 8.1 9.5  HGB 15.2 15.1 14.6 14.0  HCT 48.5 46.5 46.7 44.3  MCV 100.0 97.5 98.1 100.7*  PLT 166 143* 143* 134*   BMP &GFR Recent Labs  Lab 01/20/22 2150 01/21/22 1202 01/22/22 0600 01/23/22 0224 01/24/22 0242  NA 143 143 140 143 142  K 4.3 3.8 4.0 3.8 4.0  CL 102 101 103 105 104  CO2 32 35* 28 32 30  GLUCOSE 218* 175* 159*  142* 163*  BUN '22 17 19 '$ 27* 27*  CREATININE 1.33* 1.13 1.21 1.38* 1.40*  CALCIUM 9.1 9.5 8.8* 8.7* 8.4*  MG  --   --  1.6* 2.2 2.0  PHOS  --   --  3.3 4.3 4.1   Estimated Creatinine Clearance: 46 mL/min (A) (by C-G formula based on SCr of 1.4 mg/dL (H)). Liver & Pancreas: Recent Labs  Lab 01/21/22 1202 01/22/22 0600 01/23/22 0224 01/24/22 0242  AST 20  --   --   --   ALT 11  --   --   --   ALKPHOS 55  --   --   --   BILITOT 0.7  --   --   --   PROT 6.1*  --   --   --   ALBUMIN 3.5 3.3* 3.1* 2.9*   Recent Labs  Lab 01/21/22 1202  LIPASE 35   No results for input(s): "AMMONIA" in the last 168 hours. Diabetic: No results for input(s): "HGBA1C" in the  last 72 hours.  Recent Labs  Lab 01/23/22 0608 01/23/22 1158 01/23/22 1828 01/23/22 2058 01/24/22 0616  GLUCAP 129* 152* 160* 190* 152*   Cardiac Enzymes: No results for input(s): "CKTOTAL", "CKMB", "CKMBINDEX", "TROPONINI" in the last 168 hours. No results for input(s): "PROBNP" in the last 8760 hours. Coagulation Profile: No results for input(s): "INR", "PROTIME" in the last 168 hours. Thyroid Function Tests: No results for input(s): "TSH", "T4TOTAL", "FREET4", "T3FREE", "THYROIDAB" in the last 72 hours. Lipid Profile: No results for input(s): "CHOL", "HDL", "LDLCALC", "TRIG", "CHOLHDL", "LDLDIRECT" in the last 72 hours.  Anemia Panel: No results for input(s): "VITAMINB12", "FOLATE", "FERRITIN", "TIBC", "IRON", "RETICCTPCT" in the last 72 hours. Urine analysis:    Component Value Date/Time   COLORURINE YELLOW 08/12/2021 2308   APPEARANCEUR CLEAR 08/12/2021 2308   LABSPEC 1.020 08/12/2021 2308   PHURINE 6.0 08/12/2021 2308   GLUCOSEU 50 (A) 08/12/2021 2308   HGBUR NEGATIVE 08/12/2021 2308   BILIRUBINUR NEGATIVE 08/12/2021 2308   KETONESUR NEGATIVE 08/12/2021 2308   PROTEINUR 30 (A) 08/12/2021 2308   UROBILINOGEN 1.0 08/25/2008 0326   NITRITE NEGATIVE 08/12/2021 2308   LEUKOCYTESUR NEGATIVE 08/12/2021 2308    Sepsis Labs: Invalid input(s): "PROCALCITONIN", "LACTICIDVEN"  Microbiology: No results found for this or any previous visit (from the past 240 hour(s)).  Radiology Studies: No results found.    Britanni Yarde T. [place]  If 7PM-7AM, please contact night-coverage [email or web address] 01/24/2022, 11:07 AM

## 2022-01-24 NOTE — Interval H&P Note (Signed)
History and Physical Interval Note:  01/24/2022 1:15 PM  [name]  has presented today for surgery, with the diagnosis of NSTEMI.  The various methods of treatment have been discussed with the patient and family. After consideration of risks, benefits and other options for treatment, the patient has consented to  Procedure(s): LEFT HEART CATH AND CORONARY ANGIOGRAPHY (N/A) as a surgical intervention.  The patient's history has been reviewed, patient examined, no change in status, stable for surgery.  I have reviewed the patient's chart and labs.  Questions were answered to the patient's satisfaction.   Cath Lab Visit (complete for each Cath Lab visit)  Clinical Evaluation Leading to the Procedure:   ACS: Yes.    Non-ACS:    Anginal Classification: CCS IV  Anti-ischemic medical therapy: Minimal Therapy (1 class of medications)  Non-Invasive Test Results: No non-invasive testing performed  Prior CABG: No previous CABG        Collier Salina [facility] 01/24/2022 1:15 PM

## 2022-01-24 NOTE — Telephone Encounter (Signed)
Pharmacy Patient Advocate Encounter  Insurance verification completed.    The patient is insured through [employer]   The patient is currently admitted and ran test claims for the following: Farxiga, Jardiance.  Copays and coinsurance results were relayed to Inpatient clinical team.

## 2022-01-24 NOTE — H&P (View-Only) (Signed)
Rounding Note    Patient Name: Jeremiah [name] Date of Encounter: 01/24/2022  [place] Cardiologist: Peter [name] MD  Subjective   Patient denies any chest or abdominal pain. Denies dyspnea. He is on 2L [place].  Inpatient Medications    Scheduled Meds:  aspirin EC  81 mg Oral Daily   carvedilol  12.5 mg Oral BID WC   ezetimibe  10 mg Oral Daily   insulin aspart  0-15 Units Subcutaneous TID WC   insulin aspart  0-5 Units Subcutaneous QHS   insulin aspart  4 Units Subcutaneous TID WC   insulin glargine-yfgn  10 Units Subcutaneous Daily   pantoprazole  40 mg Oral QAC breakfast   sodium chloride flush  3 mL Intravenous Q12H   Continuous Infusions:  sodium chloride     sodium chloride 1 mL/kg/hr (01/24/22 0941)   heparin 1,600 Units/hr (01/23/22 0036)   PRN Meds: sodium chloride, acetaminophen **OR** acetaminophen, ondansetron **OR** ondansetron (ZOFRAN) IV, sodium chloride flush   Vital Signs    Vitals:   01/23/22 2102 01/23/22 2318 01/24/22 0319 01/24/22 0325  BP:  (!) 115/52  (!) 148/85  Pulse:    86  Resp:  16  16  Temp:  97.6 F (36.4 C)  98.1 F (36.7 C)  TempSrc:  Oral  Oral  SpO2:  92%  94%  Weight: 90.4 kg  90.3 kg   Height:        Intake/Output Summary (Last 24 hours) at 01/24/2022 1130 Last data filed at 01/24/2022 0550 Gross per 24 hour  Intake 3106.44 ml  Output 651 ml  Net 2455.44 ml      01/24/2022    3:19 AM 01/23/2022    9:02 PM 01/23/2022    4:05 AM  Last 3 Weights  Weight (lbs) 199 lb 1.2 oz 199 lb 4.7 oz 195 lb 5.2 oz  Weight (kg) 90.3 kg 90.4 kg 88.6 kg      Telemetry    Sinus rhythm - Personally Reviewed  ECG    9/1 EKG showed sinus rhythm, normal axis with RBBB - Personally Reviewed  Physical Exam   GEN: No acute distress.   Cardiac: RRR, no murmurs, rubs, or gallops. 2+ radial pulses bilaterally.  Respiratory: Normal work of breathing on 2L [place]. Clear to auscultation bilaterally. GI: Soft, nontender, non-distended   MS: No edema; No deformity. Neuro:  alert and oriented  Psych: Normal affect   Labs    High Sensitivity Troponin:   Recent Labs  Lab 01/20/22 2150 01/21/22 0200 01/21/22 0928 01/21/22 1202 01/22/22 0909  TROPONINIHS 32* 114* 654* 823* 561*     Chemistry Recent Labs  Lab 01/21/22 1202 01/22/22 0600 01/23/22 0224 01/24/22 0242  NA 143 140 143 142  K 3.8 4.0 3.8 4.0  CL 101 103 105 104  CO2 35* 28 32 30  GLUCOSE 175* 159* 142* 163*  BUN 17 19 27* 27*  CREATININE 1.13 1.21 1.38* 1.40*  CALCIUM 9.5 8.8* 8.7* 8.4*  MG  --  1.6* 2.2 2.0  PROT 6.1*  --   --   --   ALBUMIN 3.5 3.3* 3.1* 2.9*  AST 20  --   --   --   ALT 11  --   --   --   ALKPHOS 55  --   --   --   BILITOT 0.7  --   --   --   GFRNONAA >60 60* 51* 50*  ANIONGAP 7 9 6  8    Lipids  Recent Labs  Lab 01/21/22 0200  CHOL 190  TRIG 264*  HDL 40*  LDLCALC 97  CHOLHDL 4.8    Hematology Recent Labs  Lab 01/22/22 0600 01/23/22 0224 01/24/22 0242  WBC 8.3 8.1 9.5  RBC 4.77 4.76 4.40  HGB 15.1 14.6 14.0  HCT 46.5 46.7 44.3  MCV 97.5 98.1 100.7*  MCH 31.7 30.7 31.8  MCHC 32.5 31.3 31.6  RDW 14.0 14.1 14.3  PLT 143* 143* 134*   Thyroid No results for input(s): "TSH", "FREET4" in the last 168 hours.  BNPNo results for input(s): "BNP", "PROBNP" in the last 168 hours.  DDimer  Recent Labs  Lab 01/21/22 1441  DDIMER 0.91*     Radiology    No results found.  Cardiac Studies   01/21/2022 Echo 1. Left ventricular ejection fraction, by estimation, is 55 to 60%. The  left ventricle has normal function. Left ventricular endocardial border  not optimally defined to evaluate regional wall motion. Left ventricular  diastolic parameters are indeterminate.   2. Right ventricular systolic function is mildly reduced with hypokinesis  of the mid ventricle and relative apical sparing. The right ventricular  size is mildly enlarged. IVC not assessed, cannot calculate RVSP.   3. Dilated coronary sinus.    4. The mitral valve is normal in structure. Mild mitral valve  regurgitation. No evidence of mitral stenosis.   5. The aortic valve is tricuspid. Aortic valve regurgitation is mild to  moderate. No aortic stenosis is present.   6. Aortic dilatation noted. There is mild dilatation of the aortic root,  measuring 42 mm.   Patient Profile     81 y.o. 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, type 2 diabetes, chronic respiratory failure on home oxygen, hyperlipidemia, hypertension who is being seen for the evaluation of epigastric pain radiating to the chest per admission notes  Assessment & Plan    Principal Problem:   Chest pressure Active Problems:   HLD (hyperlipidemia)   HTN (hypertension)   Presence of ventriculopleural shunt   DMII (diabetes mellitus, type 2) (HCC)   Chronic respiratory failure with hypoxia, on home O2 therapy (HCC) - 2 L/min prn per patient   NPH (normal pressure hydrocephalus) ([place])  NSTEMI Epigastric pain Patient currently denies chest pain or dyspnea. Trops peaked at 823 and downtrending now. 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 Normal LFTs and lipase, likely anginal equivalent abdominal pain. Echo showed normal EF but unable to assess wall motion abnormality or diastolic function, mild RV hypokinesis with relative apical sparing.  - on heparin gtt, ASA 81 mg daily, carvedilol 12.5 mg BID, zetia 10 mg daily  - cardiac cath today - there is mid RV hypokinesis on echo with relative apical sparing, but clinical presentation seems less consistent with hemodynamically significant PE. D-dimer is non negative. Could consider CTA or V/Q scan for PE if no obstructive CAD. Will need to limit contrast load with CKD.   Hypertension - At home he is on carvedilol 3.125 mg BID. Increased to 12.5 mg BID.    For questions or updates, please contact [place] Please consult [email or web address] for contact info under         Signed, Angelique Blonder, DO  01/24/2022, 11:30 AM     I have examined the patient and reviewed assessment and plan and discussed with patient.  Agree with above as stated.    Elevated troponin.  No  chest pain currently.  All questions about cath answered.  Plan for cath later today.   Larae Grooms

## 2022-01-24 NOTE — TOC Benefit Eligibility Note (Signed)
Patient Teacher, English as a foreign language completed.    The patient is currently admitted and upon discharge could be taking Jardiance '25mg'$  tablet.  The current 30 day co-pay is $37.00.   The patient is currently admitted and upon discharge could be taking [place] '10mg'$  tablet.  The current 30 day co-pay is $37.00.   The patient is insured through [place], [place] Patient Advocate Specialist [place] Patient Advocate Team Direct Number: [phone]  Fax: [phone]

## 2022-01-24 NOTE — Progress Notes (Addendum)
Rounding Note    Patient Name: Jeremiah [name] Date of Encounter: 01/24/2022  [place] Cardiologist: Peter [name] MD  Subjective   Patient denies any chest or abdominal pain. Denies dyspnea. He is on 2L [place].  Inpatient Medications    Scheduled Meds:  aspirin EC  81 mg Oral Daily   carvedilol  12.5 mg Oral BID WC   ezetimibe  10 mg Oral Daily   insulin aspart  0-15 Units Subcutaneous TID WC   insulin aspart  0-5 Units Subcutaneous QHS   insulin aspart  4 Units Subcutaneous TID WC   insulin glargine-yfgn  10 Units Subcutaneous Daily   pantoprazole  40 mg Oral QAC breakfast   sodium chloride flush  3 mL Intravenous Q12H   Continuous Infusions:  sodium chloride     sodium chloride 1 mL/kg/hr (01/24/22 0941)   heparin 1,600 Units/hr (01/23/22 0036)   PRN Meds: sodium chloride, acetaminophen **OR** acetaminophen, ondansetron **OR** ondansetron (ZOFRAN) IV, sodium chloride flush   Vital Signs    Vitals:   01/23/22 2102 01/23/22 2318 01/24/22 0319 01/24/22 0325  BP:  (!) 115/52  (!) 148/85  Pulse:    86  Resp:  16  16  Temp:  97.6 F (36.4 C)  98.1 F (36.7 C)  TempSrc:  Oral  Oral  SpO2:  92%  94%  Weight: 90.4 kg  90.3 kg   Height:        Intake/Output Summary (Last 24 hours) at 01/24/2022 1130 Last data filed at 01/24/2022 0550 Gross per 24 hour  Intake 3106.44 ml  Output 651 ml  Net 2455.44 ml      01/24/2022    3:19 AM 01/23/2022    9:02 PM 01/23/2022    4:05 AM  Last 3 Weights  Weight (lbs) 199 lb 1.2 oz 199 lb 4.7 oz 195 lb 5.2 oz  Weight (kg) 90.3 kg 90.4 kg 88.6 kg      Telemetry    Sinus rhythm - Personally Reviewed  ECG    9/1 EKG showed sinus rhythm, normal axis with RBBB - Personally Reviewed  Physical Exam   GEN: No acute distress.   Cardiac: RRR, no murmurs, rubs, or gallops. 2+ radial pulses bilaterally.  Respiratory: Normal work of breathing on 2L [place]. Clear to auscultation bilaterally. GI: Soft, nontender, non-distended   MS: No edema; No deformity. Neuro:  alert and oriented  Psych: Normal affect   Labs    High Sensitivity Troponin:   Recent Labs  Lab 01/20/22 2150 01/21/22 0200 01/21/22 0928 01/21/22 1202 01/22/22 0909  TROPONINIHS 32* 114* 654* 823* 561*     Chemistry Recent Labs  Lab 01/21/22 1202 01/22/22 0600 01/23/22 0224 01/24/22 0242  NA 143 140 143 142  K 3.8 4.0 3.8 4.0  CL 101 103 105 104  CO2 35* 28 32 30  GLUCOSE 175* 159* 142* 163*  BUN 17 19 27* 27*  CREATININE 1.13 1.21 1.38* 1.40*  CALCIUM 9.5 8.8* 8.7* 8.4*  MG  --  1.6* 2.2 2.0  PROT 6.1*  --   --   --   ALBUMIN 3.5 3.3* 3.1* 2.9*  AST 20  --   --   --   ALT 11  --   --   --   ALKPHOS 55  --   --   --   BILITOT 0.7  --   --   --   GFRNONAA >60 60* 51* 50*  ANIONGAP 7 9 6  8    Lipids  Recent Labs  Lab 01/21/22 0200  CHOL 190  TRIG 264*  HDL 40*  LDLCALC 97  CHOLHDL 4.8    Hematology Recent Labs  Lab 01/22/22 0600 01/23/22 0224 01/24/22 0242  WBC 8.3 8.1 9.5  RBC 4.77 4.76 4.40  HGB 15.1 14.6 14.0  HCT 46.5 46.7 44.3  MCV 97.5 98.1 100.7*  MCH 31.7 30.7 31.8  MCHC 32.5 31.3 31.6  RDW 14.0 14.1 14.3  PLT 143* 143* 134*   Thyroid No results for input(s): "TSH", "FREET4" in the last 168 hours.  BNPNo results for input(s): "BNP", "PROBNP" in the last 168 hours.  DDimer  Recent Labs  Lab 01/21/22 1441  DDIMER 0.91*     Radiology    No results found.  Cardiac Studies   01/21/2022 Echo 1. Left ventricular ejection fraction, by estimation, is 55 to 60%. The  left ventricle has normal function. Left ventricular endocardial border  not optimally defined to evaluate regional wall motion. Left ventricular  diastolic parameters are indeterminate.   2. Right ventricular systolic function is mildly reduced with hypokinesis  of the mid ventricle and relative apical sparing. The right ventricular  size is mildly enlarged. IVC not assessed, cannot calculate RVSP.   3. Dilated coronary sinus.    4. The mitral valve is normal in structure. Mild mitral valve  regurgitation. No evidence of mitral stenosis.   5. The aortic valve is tricuspid. Aortic valve regurgitation is mild to  moderate. No aortic stenosis is present.   6. Aortic dilatation noted. There is mild dilatation of the aortic root,  measuring 42 mm.   Patient Profile     81 y.o. 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, type 2 diabetes, chronic respiratory failure on home oxygen, hyperlipidemia, hypertension who is being seen for the evaluation of epigastric pain radiating to the chest per admission notes  Assessment & Plan    Principal Problem:   Chest pressure Active Problems:   HLD (hyperlipidemia)   HTN (hypertension)   Presence of ventriculopleural shunt   DMII (diabetes mellitus, type 2) (HCC)   Chronic respiratory failure with hypoxia, on home O2 therapy (HCC) - 2 L/min prn per patient   NPH (normal pressure hydrocephalus) ([place])  NSTEMI Epigastric pain Patient currently denies chest pain or dyspnea. Trops peaked at 823 and downtrending now. 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 Normal LFTs and lipase, likely anginal equivalent abdominal pain. Echo showed normal EF but unable to assess wall motion abnormality or diastolic function, mild RV hypokinesis with relative apical sparing.  - on heparin gtt, ASA 81 mg daily, carvedilol 12.5 mg BID, zetia 10 mg daily  - cardiac cath today - there is mid RV hypokinesis on echo with relative apical sparing, but clinical presentation seems less consistent with hemodynamically significant PE. D-dimer is non negative. Could consider CTA or V/Q scan for PE if no obstructive CAD. Will need to limit contrast load with CKD.   Hypertension - At home he is on carvedilol 3.125 mg BID. Increased to 12.5 mg BID.    For questions or updates, please contact [place] Please consult [email or web address] for contact info under         Signed, Angelique Blonder, DO  01/24/2022, 11:30 AM     I have examined the patient and reviewed assessment and plan and discussed with patient.  Agree with above as stated.    Elevated troponin.  No  chest pain currently.  All questions about cath answered.  Plan for cath later today.   Larae Grooms

## 2022-01-24 NOTE — Consult Note (Signed)
   [facility] Inpatient Consult   01/24/2022  Jeremiah [name] 1940/06/29 [id number]  [facility] [facility] [[facility]] Patient:  Primary Care Provider:  [name], MD, [place] is the provider   Patient screened for hospitalization with noted high risk score for unplanned readmission risk and  to assess for potential [place] Management service needs for post hospital transition.  Went to room on rounds and patient is not in the room. Review of patient's medical record reveals patient is in a procedure.   Plan:  Continue to follow progress and disposition to assess for post hospital care management needs.    For questions contact:   Natividad Brood, RN BSN [facility] Liaison  [phone] business mobile phone Toll free office [phone]  Fax number: [phone] [email or web address]

## 2022-01-25 ENCOUNTER — Encounter (HOSPITAL_COMMUNITY): Payer: Self-pay | Admitting: Cardiology

## 2022-01-25 ENCOUNTER — Other Ambulatory Visit: Payer: Self-pay

## 2022-01-25 DIAGNOSIS — E782 Mixed hyperlipidemia: Secondary | ICD-10-CM

## 2022-01-25 DIAGNOSIS — I214 Non-ST elevation (NSTEMI) myocardial infarction: Secondary | ICD-10-CM

## 2022-01-25 DIAGNOSIS — J9611 Chronic respiratory failure with hypoxia: Secondary | ICD-10-CM

## 2022-01-25 DIAGNOSIS — E1159 Type 2 diabetes mellitus with other circulatory complications: Secondary | ICD-10-CM

## 2022-01-25 DIAGNOSIS — R0789 Other chest pain: Secondary | ICD-10-CM | POA: Diagnosis not present

## 2022-01-25 LAB — CBC
HCT: 49.6 % (ref 39.0–52.0)
Hemoglobin: 15 g/dL (ref 13.0–17.0)
MCH: 31.3 pg (ref 26.0–34.0)
MCHC: 30.2 g/dL (ref 30.0–36.0)
MCV: 103.5 fL — ABNORMAL HIGH (ref 80.0–100.0)
Platelets: 141 10*3/uL — ABNORMAL LOW (ref 150–400)
RBC: 4.79 MIL/uL (ref 4.22–5.81)
RDW: 14.4 % (ref 11.5–15.5)
WBC: 10.7 10*3/uL — ABNORMAL HIGH (ref 4.0–10.5)
nRBC: 0 % (ref 0.0–0.2)

## 2022-01-25 LAB — BASIC METABOLIC PANEL
Anion gap: 8 (ref 5–15)
BUN: 25 mg/dL — ABNORMAL HIGH (ref 8–23)
CO2: 27 mmol/L (ref 22–32)
Calcium: 8.6 mg/dL — ABNORMAL LOW (ref 8.9–10.3)
Chloride: 109 mmol/L (ref 98–111)
Creatinine, Ser: 1.36 mg/dL — ABNORMAL HIGH (ref 0.61–1.24)
GFR, Estimated: 52 mL/min — ABNORMAL LOW (ref >60)
Glucose, Bld: 155 mg/dL — ABNORMAL HIGH (ref 70–99)
Potassium: 4.4 mmol/L (ref 3.5–5.1)
Sodium: 144 mmol/L (ref 135–145)

## 2022-01-25 LAB — GLUCOSE, CAPILLARY
Glucose-Capillary: 147 mg/dL — ABNORMAL HIGH (ref 70–99)
Glucose-Capillary: 139 mg/dL — ABNORMAL HIGH (ref 70–99)

## 2022-01-25 MED ORDER — CLOPIDOGREL BISULFATE 75 MG PO TABS: 75.0000 mg | ORAL_TABLET | Freq: Every day | ORAL | 1 refills | Status: DC

## 2022-01-25 MED ORDER — ROSUVASTATIN CALCIUM 10 MG PO TABS: 10.0000 mg | ORAL_TABLET | Freq: Every day | ORAL | 11 refills | Status: DC

## 2022-01-25 MED ORDER — CARVEDILOL 12.5 MG PO TABS
12.5000 mg | ORAL_TABLET | Freq: Two times a day (BID) | ORAL | 1 refills | Status: AC
Start: 2022-01-25 — End: ?

## 2022-01-25 MED ORDER — DAPAGLIFLOZIN PROPANEDIOL 10 MG PO TABS: 10.0000 mg | ORAL_TABLET | Freq: Every day | ORAL | 11 refills | Status: DC

## 2022-01-25 MED FILL — Nitroglycerin IV Soln 100 MCG/ML in D5W: INTRA_ARTERIAL | Qty: 10 | Status: AC

## 2022-01-25 NOTE — Progress Notes (Signed)
Rounding Note    Patient Name: Jeremiah [name] Date of Encounter: 01/25/2022  [place] Cardiologist: None Dr. [name]  Subjective   Feels well.  No chest pain.  No bleeding at the radial site.  Inpatient Medications    Scheduled Meds:  aspirin EC  81 mg Oral Daily   carvedilol  12.5 mg Oral BID WC   clopidogrel  75 mg Oral Q breakfast   ezetimibe  10 mg Oral Daily   heparin  5,000 Units Subcutaneous Q8H   insulin aspart  0-15 Units Subcutaneous TID WC   insulin aspart  0-5 Units Subcutaneous QHS   insulin aspart  4 Units Subcutaneous TID WC   insulin glargine-yfgn  10 Units Subcutaneous Daily   pantoprazole  40 mg Oral QAC breakfast   sodium chloride flush  3 mL Intravenous Q12H   sodium chloride flush  3 mL Intravenous Q12H   Continuous Infusions:  sodium chloride     PRN Meds: sodium chloride, acetaminophen **OR** acetaminophen, ondansetron **OR** ondansetron (ZOFRAN) IV, sodium chloride flush   Vital Signs    Vitals:   01/24/22 2320 01/25/22 0325 01/25/22 0416 01/25/22 0737  BP: 121/75 130/67  123/61  Pulse: 81 78  85  Resp: (!) 22 (!) 23  20  Temp: 98 F (36.7 C) 98.1 F (36.7 C)  98 F (36.7 C)  TempSrc: Oral Oral    SpO2: 92% 94%  95%  Weight:   88.8 kg   Height:        Intake/Output Summary (Last 24 hours) at 01/25/2022 0902 Last data filed at 01/25/2022 0420 Gross per 24 hour  Intake 1852.29 ml  Output 500 ml  Net 1352.29 ml      01/25/2022    4:16 AM 01/24/2022    3:19 AM 01/23/2022    9:02 PM  Last 3 Weights  Weight (lbs) 195 lb 12.3 oz 199 lb 1.2 oz 199 lb 4.7 oz  Weight (kg) 88.8 kg 90.3 kg 90.4 kg      Telemetry    Normal sinus rhythm- Personally Reviewed  ECG    Normal sinus rhythm, incomplete right bundle branch block- Personally Reviewed  Physical Exam   GEN: No acute distress.   Neck: No JVD Cardiac: RRR, no murmurs, rubs, or gallops.  Respiratory: Clear to auscultation bilaterally. GI: Soft, nontender,  non-distended  MS: No edema; No deformity.  2+ right radial pulse Neuro:  Nonfocal  Psych: Normal affect   Labs    High Sensitivity Troponin:   Recent Labs  Lab 01/20/22 2150 01/21/22 0200 01/21/22 0928 01/21/22 1202 01/22/22 0909  TROPONINIHS 32* 114* 654* 823* 561*     Chemistry Recent Labs  Lab 01/21/22 1202 01/22/22 0600 01/23/22 0224 01/24/22 0242 01/25/22 0604  NA 143 140 143 142 144  K 3.8 4.0 3.8 4.0 4.4  CL 101 103 105 104 109  CO2 35* 28 32 30 27  GLUCOSE 175* 159* 142* 163* 155*  BUN 17 19 27* 27* 25*  CREATININE 1.13 1.21 1.38* 1.40* 1.36*  CALCIUM 9.5 8.8* 8.7* 8.4* 8.6*  MG  --  1.6* 2.2 2.0  --   PROT 6.1*  --   --   --   --   ALBUMIN 3.5 3.3* 3.1* 2.9*  --   AST 20  --   --   --   --   ALT 11  --   --   --   --   ALKPHOS 55  --   --   --   --  BILITOT 0.7  --   --   --   --   GFRNONAA >60 60* 51* 50* 52*  ANIONGAP '7 9 6 8 8    '$ Lipids  Recent Labs  Lab 01/21/22 0200  CHOL 190  TRIG 264*  HDL 40*  LDLCALC 97  CHOLHDL 4.8    Hematology Recent Labs  Lab 01/23/22 0224 01/24/22 0242 01/25/22 0604  WBC 8.1 9.5 10.7*  RBC 4.76 4.40 4.79  HGB 14.6 14.0 15.0  HCT 46.7 44.3 49.6  MCV 98.1 100.7* 103.5*  MCH 30.7 31.8 31.3  MCHC 31.3 31.6 30.2  RDW 14.1 14.3 14.4  PLT 143* 134* 141*   Thyroid No results for input(s): "TSH", "FREET4" in the last 168 hours.  BNPNo results for input(s): "BNP", "PROBNP" in the last 168 hours.  DDimer  Recent Labs  Lab 01/21/22 1441  DDIMER 0.91*     Radiology    CARDIAC CATHETERIZATION  Result Date: 01/24/2022   Prox LAD lesion is 50% stenosed.   1st Diag lesion is 50% stenosed.   Prox RCA to Mid RCA lesion is 15% stenosed.   RPDA lesion is 90% stenosed.   A drug-eluting stent was successfully placed using a STENT ONYX FRONTIER 2.5X12.   Post intervention, there is a 0% residual stenosis.   LV end diastolic pressure is normal. 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 Anticipate DC in am    Cardiac Studies   Cardiac cath films reviewed  Patient Profile     81 y.o. male with NSTEMI and successful PCI to the ostial PDA.  Assessment & Plan    Continue dual antiplatelet therapy along with lipid-lowering therapy.  He has been intolerant of Lipitor in the past.  Currently on Zetia.  Could consider PCSK9 inhibitor as outpatient.    He has been on carvedilol as well which was increased.  No ACE inhibitor or ARB due to chronic renal insufficiency.  Blood pressure and heart rate are well controlled.  DM: A1c 7.9.  Okay to discharge as long as he does well with cardiac rehab.  I stressed the importance of taking his clopidogrel. Follow-up to be scheduled.  For questions or updates, please contact [place] Please consult [email or web address] for contact info under        Signed, Larae Grooms, MD  01/25/2022, 9:02 AM

## 2022-01-25 NOTE — Consult Note (Signed)
   [facility] CM Inpatient Consult   01/25/2022  KAIS MONJE 08-16-1940 [id number]  Follow up:  Probable DC noted  Met with patient at the bedside.  Spoke with patient regarding post hospital follow up with PCP. Patient endorses [name], MD, as provider.  Explained regarding [place] Coordination.  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 He says she has an aide 2 days a week.  Patient states he still drives and they mostly eat out and he does not cook.  Explained that he has access to care coordination with PCP office for post hospital follow up. Patient speaks slowing and ask questions. May be a bit overwhelming.  States his son lives in [place] and daughter in [place].  Plan: Will assign for additional support care coordination diet and transition of care needs. Patient was explained and given an appointment reminder card and a 24 hour nurse advise line magnet.  Natividad Brood, RN BSN [facility] Liaison  [phone] business mobile phone Toll free office [phone]  Fax number: [phone] [email or web address]

## 2022-01-25 NOTE — Progress Notes (Signed)
CARDIAC REHAB PHASE I     Pt sitting up in chair, feeling good this morning. Pt walked with OT this morning and reports he did well walking with no CP and minimal SOB. Post MI/Stent teaching including risk factors, site care, exercise guidelines, heart healthy diabetic diet, restrictions, MI booklet, antiplatelet therapy importance, and CRP2 reviewed. Will refer to [facility] for CRP2. All questions and concerns addressed. Possible discharge later today. Will continue to follow.   [id number]  Vanessa Barbara, RN BSN 01/25/2022 10:08 AM

## 2022-01-25 NOTE — Evaluation (Addendum)
Occupational Therapy Evaluation Patient Details Name: Jeremiah [name] MRN: [id number] DOB: 1940-06-13 Today's Date: 01/25/2022   History of Present Illness 81 y/o male presented to ED on 01/20/22 for chest pain. EKG showed R bundle branch block. Admitted for elevated troponin in setting of uncontrolled HTN.  S/P L heart cath and coronary angiography 9/5. PMH: BPH, DM, HTN, OSA on CPAP, AAA, prostate CA s/p raditation, NPH s/p VP shunt   Clinical Impression   PTA patient reports independent and driving, caring for his spouse with dementia.  Patient seen s/p L heart cath 9/5.  He completes ADLs and mobility with up to min guard assist during session. Limited by cognition, activity tolerance.  He is disoriented to time, demonstrates decreased recall, safety awareness, problem solving and attention- scoring 18/28 (significant impairment) on short blessed test. He did required 1-2L O2 during activity to maintain SpO2 <90%. Pt reports aide and children can assist at home, will need 24/7 support. Based on performance today, believe pt will best benefit from continued OT services acutely and after dc at [facility] level to optimize independence, safety and return to PLOF.  Patient will need 24/7 assist with due to cognitive deficits, assist specifically with meals, meds, and driving at this time. Will follow acutely.    Recommendations for follow up therapy are one component of a multi-disciplinary discharge planning process, led by the attending physician.  Recommendations may be updated based on patient status, additional functional criteria and insurance authorization.   Follow Up Recommendations  Home health OT    Assistance Recommended at Discharge Frequent or constant Supervision/Assistance  Patient can return home with the following A little help with walking and/or transfers;A little help with bathing/dressing/bathroom;Assistance with cooking/housework;Direct supervision/assist for medications  management;Direct supervision/assist for financial management;Assist for transportation;Help with stairs or ramp for entrance    Functional Status Assessment  Patient has had a recent decline in their functional status and demonstrates the ability to make significant improvements in function in a reasonable and predictable amount of time.  Equipment Recommendations  None recommended by OT    Recommendations for Other Services PT consult;Speech consult     Precautions / Restrictions Precautions Precautions: Fall Precaution Comments: watch O2 Restrictions Weight Bearing Restrictions: No      Mobility Bed Mobility               General bed mobility comments: OOB in recliner upon entry    Transfers Overall transfer level: Needs assistance Equipment used: None Transfers: Sit to/from Stand Sit to Stand: Min guard           General transfer comment: for safety      Balance Overall balance assessment: Mild deficits observed, not formally tested                                         ADL either performed or assessed with clinical judgement   ADL Overall ADL's : Needs assistance/impaired     Grooming: Min guard;Standing           Upper Body Dressing : Set up;Sitting   Lower Body Dressing: Minimal assistance;Sit to/from stand   Toilet Transfer: Min guard;Ambulation           Functional mobility during ADLs: Min guard       Vision   Vision Assessment?: No apparent visual deficits     Perception  Praxis      Pertinent Vitals/Pain Pain Assessment Pain Assessment: No/denies pain     Hand Dominance Right   Extremity/Trunk Assessment Upper Extremity Assessment Upper Extremity Assessment: Generalized weakness   Lower Extremity Assessment Lower Extremity Assessment: Defer to PT evaluation   Cervical / Trunk Assessment Cervical / Trunk Assessment: Normal   Communication Communication Communication: No difficulties    Cognition Arousal/Alertness: Awake/alert Behavior During Therapy: WFL for tasks assessed/performed Overall Cognitive Status: Impaired/Different from baseline Area of Impairment: Orientation, Attention, Memory, Following commands, Safety/judgement, Awareness, Problem solving                 Orientation Level: Disoriented to, Time Current Attention Level: Sustained Memory: Decreased recall of precautions, Decreased short-term memory Following Commands: Follows one step commands consistently, Follows one step commands with increased time, Follows multi-step commands inconsistently Safety/Judgement: Decreased awareness of safety, Decreased awareness of deficits Awareness: Emergent Problem Solving: Slow processing, Decreased initiation, Requires verbal cues, Difficulty sequencing General Comments: pt with confusion, unable to recall year multiple times during session.  Does not understand by lack of memory would affect his ability to drive.  COmpleting short blessed test with deficits seen in recall, attention, and sequencing.  19/28 signifincant impairment.     General Comments  pt on 1L upon entry, transitioned to RA and SPO2 dropped to 86%; activity in room on 1L with SPO2 maintained 90-92% but dropped to 86% when resting therefore increased to 2L to maintained SpO2    Exercises     Shoulder Instructions      Home Living Family/patient expects to be discharged to:: Private residence Living Arrangements: Spouse/significant other Available Help at Discharge: Family;Available PRN/intermittently Type of Home: House Home Access: Stairs to enter [employer] of Steps: 2 Entrance Stairs-Rails: Can reach both;Left;Right Home Layout: Multi-level;Able to live on main level with bedroom/bathroom Alternate Level Stairs-Number of Steps: stays on main level   Bathroom Shower/Tub: Occupational psychologist: Standard     Home Equipment: Grab bars - tub/shower;Hand held  shower head;Cane - single point;Rolling Walker (2 wheels);Shower seat - built in   Additional Comments: spouse has dementia, has CNA to help 2x/ week and family PRN      Prior Functioning/Environment Prior Level of Function : Independent/Modified Independent             Mobility Comments: no AD ADLs Comments: drives, cares for spouse (mostly cog); reports has assist with meds, meals and cleaning at baseline        OT Problem List: Decreased activity tolerance;Decreased knowledge of precautions;Decreased knowledge of use of DME or AE;Decreased safety awareness;Decreased cognition;Impaired balance (sitting and/or standing)      OT Treatment/Interventions: Self-care/ADL training;DME and/or AE instruction;Energy conservation;Therapeutic activities;Cognitive remediation/compensation;Patient/family education    OT Goals(Current goals can be found in the care plan section) Acute Rehab OT Goals Patient Stated Goal: to go home OT Goal Formulation: With patient Time For Goal Achievement: 02/08/22 Potential to Achieve Goals: Good ADL Goals Pt Will Perform Grooming: with modified independence;standing Pt Will Perform Lower Body Dressing: with modified independence;sit to/from stand Pt Will Transfer to Toilet: with modified independence;ambulating Pt Will Perform Toileting - Clothing Manipulation and hygiene: Independently;sit to/from stand Additional ADL Goal #1: Pt will recall and complete 3 step trail making task with independence, using compensatory techniques as needed.  OT Frequency:      Co-evaluation              AM-PAC OT "6 Clicks" Daily Activity  Outcome Measure Help from another person eating meals?: A Little Help from another person taking care of personal grooming?: A Little Help from another person toileting, which includes using toliet, bedpan, or urinal?: A Little Help from another person bathing (including washing, rinsing, drying)?: A Little Help from  another person to put on and taking off regular upper body clothing?: A Little Help from another person to put on and taking off regular lower body clothing?: A Little 6 Click Score: 18   End of Session Equipment Utilized During Treatment: Oxygen Nurse Communication: Mobility status;Other (comment) (confusion)  Activity Tolerance: Patient tolerated treatment well Patient left: in chair;Other (comment) (with PT)  OT Visit Diagnosis: Unsteadiness on feet (R26.81);Other symptoms and signs involving cognitive function                Time: 2081-3887 OT Time Calculation (min): 25 min Charges:  OT General Charges $OT Visit: 1 Visit OT Evaluation $OT Eval Moderate Complexity: 1 Mod OT Treatments $Self Care/Home Management : 8-22 mins  Jolaine Artist, OT Acute Rehabilitation Services Office [phone]   Delight Stare 01/25/2022, 9:21 AM

## 2022-01-25 NOTE — Progress Notes (Signed)
SATURATION QUALIFICATIONS: (This note is used to comply with regulatory documentation for home oxygen)  Patient Saturations on Room Air at Rest = 89%  Patient Saturations on Room Air while Ambulating = 82%  Patient Saturations on 2 Liters of oxygen while Ambulating = 92%  Please briefly explain why patient needs home oxygen: Patient requires supplemental oxygen to maintain SpO2 >/88% with activity.  Mabeline Caras, PT, DPT Acute Rehabilitation Services  Personal: [place] Office: [phone]

## 2022-01-25 NOTE — TOC Transition Note (Signed)
Transition of Care [facility]) - CM/SW Discharge Note   Patient Details  Name: [name] MRN: [id number] Date of Birth: 11/18/1940  [facility]) CM/SW Contact:  Angelita Ingles, RN Phone Number:[phone]  01/25/2022, 4:02 PM   Clinical Narrative:    Patient with d/c orderd no TOC needs.         Patient Goals and CMS Choice        Discharge Placement                       Discharge Plan and Services                                     Social Determinants of Health (SDOH) Interventions     Readmission Risk Interventions     No data to display

## 2022-01-25 NOTE — Care Management Important Message (Signed)
Important Message  Patient Details  Name: Jeremiah [name] MRN: [id number] Date of Birth: 25-Aug-1940   Medicare Important Message Given:  Yes     Orbie Pyo 01/25/2022, 2:16 PM

## 2022-01-25 NOTE — Progress Notes (Deleted)
SATURATION QUALIFICATIONS: (This note is used to comply with regulatory documentation for home oxygen)  Patient Saturations on Room Air at Rest = 91%  Patient Saturations on Room Air while Ambulating = 89%  Patient Saturations on 1 Liters of oxygen while Ambulating = 92%

## 2022-01-25 NOTE — Discharge Summary (Signed)
Physician Discharge Summary  Jeremiah [name] QQP:[id number] DOB: Aug 31, 1940 DOA: 01/20/2022  PCP: [name], MD  Admit date: 01/20/2022 Discharge date: 01/25/2022 Admitted From: Home. Disposition: Home. Recommendations for Outpatient Follow-up:  Follow up with PCP and cardiology as below. Check BMP, CBC and blood pressure at follow-up Please follow up on the following pending results: None  Home Health: None Equipment/Devices: Patient has home oxygen and uses 2 L by  as needed  Discharge Condition: Stable CODE STATUS: Full code  Follow-up Information     [name], MD. Schedule an appointment as soon as possible for a visit in 1 week(s).   Specialty: Internal Medicine Contact information: [place] [address] [phone]         Sande Rives E, PA-C Follow up on 02/03/2022.   Specialties: Physician Assistant, Cardiology Why: at 8:25am for your follow up with Dr. Morrison [place] information: [place] [address] [facility] course 81 year old M with PMH of NPH/VP shunt, DM-2, chronic RF on 2 L, HTN and HLD presenting with 2 days history of chest/epigastric pressure sensation.  Patient denies exertional chest pain, dyspnea, SOB, DOE, diaphoresis, nausea, vomiting or fever.   In ED, elevated BP to 196/111.  Troponin 32.  EKG with NSR and RBBB.  Cardiology consulted and recommended admission.   Patient remained chest pain-free since admission.  Troponin peaked at 823.  Started on IV heparin.  TTE with LVEF of 55 to 60% but not able to assess RWMA or diastolic parameters.    [place] 09/5 showed old RPDA lesion with 90% stenosis (see details below).  This was treated with DES with 0% residual stenosis.  Cardiology recommended DAPT for 1 year and cardiac rehab.  On the day of discharge, patient remained asymptomatic.  He was ambulated on room air and desaturated to 82% but  recovered to 92% on home 2 L.  He was cleared for discharge by cardiology.  No need identified by therapy.  He is discharged on Coreg, low-dose aspirin, Plavix, Crestor and [place].  Outpatient follow-up with PCP and cardiology as above.  See individual problem list below for more.   Problems addressed during this hospitalization Principal Problem:   Chest pressure Active Problems:   HLD (hyperlipidemia)   HTN (hypertension)   Presence of ventriculopleural shunt   DMII (diabetes mellitus, type 2) (HCC)   Chronic respiratory failure with hypoxia, on home O2 therapy (HCC) - 2 L/min prn per patient   NPH (normal pressure hydrocephalus) (HCC)   Non-ST elevation (NSTEMI) myocardial infarction ([place])   Non-STEMI: Patient denies exertional CP or DOE.  Chest/epigastric discomfort seems to have resolved since admission.  However, troponin 32 >>823> 500. EKG without acute ischemic finding. Started on IV heparin. TTE with normal LVEF but not able to assess RWMA or diastolic function. S/p LHC and DES stent to RPDA on 9/5. -Increased home Coreg from 6.25 mg to 12.5 mg twice daily -Cardiology recommended DAPT (aspirin and Plavix) for 1 year -Added [place] and Crestor.  Continue Zetia.  LDL 97. -Outpatient follow-up with cardiology and PCP as above -Cardiac rehab   Uncontrolled hypertension: BP within acceptable range for most part -Cardiac meds as above.   NPH (normal pressure hydrocephalus)/VP shunt -Chronic. Stable.   Chronic respiratory failure with hypoxia: Stable.  Uses 2 L as needed. 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  On low-dose metformin at home. -Continue home metformin. -Added Farxiga and Crestor.   CKD-3B?:  Cr slightly up but at baseline. -Recheck renal function in 1 to 2 weeks.   HLD: LDL 97.  History of statin intolerance. -Continue zetia.   Hospital delirium: Resolved. -Reorientation and  delirium precautions.   Mild thrombocytopenia: Stable -Recheck CBC at follow-up.           Vital signs Vitals:   01/25/22 0325 01/25/22 0416 01/25/22 0737 01/25/22 1109  BP: 130/67  123/61 122/72  Pulse: 78  85 76  Temp: 98.1 F (36.7 C)  98 F (36.7 C) 98.7 F (37.1 C)  Resp: (!) _0 Height:      Weight:  88.8 kg    SpO2: 94%  95% 91%  TempSrc: Oral     BMI (Calculated):  28.09       Discharge exam  GENERAL: No apparent distress.  Nontoxic. HEENT: MMM.  Vision and hearing grossly intact.  NECK: Supple.  No apparent JVD.  RESP:  No IWOB.  Fair aeration bilaterally. CVS:  RRR. Heart sounds normal.  ABD/GI/GU: BS+. Abd soft, NTND.  MSK/EXT:  Moves extremities. No apparent deformity. No edema.  SKIN: no apparent skin lesion or wound NEURO: Awake and alert. Oriented appropriately.  No apparent focal neuro deficit. PSYCH: Calm. Normal affect.   Discharge Instructions Discharge Instructions     Amb Referral to Cardiac Rehabilitation   Complete by: As directed    Diagnosis:  NSTEMI Coronary Stents     After initial evaluation and assessments completed: Virtual Based Care may be provided alone or in conjunction with Phase 2 Cardiac Rehab based on patient barriers.: Yes   Intensive Cardiac Rehabilitation (ICR) [place] location only OR Traditional Cardiac Rehabilitation (TCR) If criteria for ICR are not met will enroll in [facility] only): Yes   Call MD for:  difficulty breathing, headache or visual disturbances   Complete by: As directed    Call MD for:  extreme fatigue   Complete by: As directed    Call MD for:  persistant dizziness or light-headedness   Complete by: As directed    Call MD for:  severe uncontrolled pain   Complete by: As directed    Diet - low sodium heart healthy   Complete by: As directed    Diet Carb Modified   Complete by: As directed    Discharge instructions   Complete by: As directed    It has been a pleasure taking care of you!  You  were hospitalized with heart attack for which you have been treated surgically with stent placement.  We are discharging you on aspirin and Plavix to prevent further heart attack.  We have also increased your carvedilol.  Please review your new medication list and the directions on your medications before you take them.  Follow-up with your primary care doctor in 1 to 2 weeks or sooner if needed.  Follow-up with his cardiologist per the recommendation.  It is very important that you avoid any over-the-counter medications unless you talk to your cardiologist or your primary care doctor.   Take care,   Increase activity slowly   Complete by: As directed       Allergies as of 01/25/2022       Reactions   Lipitor [atorvastatin] Other (See Comments)   Hip pain        Medication List  STOP taking these medications    NYQUIL MULTI-SYMPTOM PO       TAKE these medications    Acetaminophen Extra Strength 500 MG Tabs Take 1 tablet (500 mg total) by mouth every 6 (six) hours as needed for mild pain or moderate pain.   aspirin EC 81 MG tablet Take 81 mg by mouth daily.   carvedilol 12.5 MG tablet Commonly known as: COREG Take 1 tablet (12.5 mg total) by mouth 2 (two) times daily. What changed:  medication strength how much to take   cetirizine 10 MG tablet Commonly known as: ZYRTEC Take 10 mg by mouth daily.   cholecalciferol 25 MCG (1000 UNIT) tablet Commonly known as: VITAMIN D3 Take 1,000 Units by mouth daily.   clopidogrel 75 MG tablet Commonly known as: PLAVIX Take 1 tablet (75 mg total) by mouth daily with breakfast. Start taking on: January 26, 2022   dapagliflozin propanediol 10 MG Tabs tablet Commonly known as: Farxiga Take 1 tablet (10 mg total) by mouth daily before breakfast.   ezetimibe 10 MG tablet Commonly known as: ZETIA Take 10 mg by mouth daily.   meclizine 25 MG tablet Commonly known as: ANTIVERT Take 1 tablet (25 mg total) by mouth 3 (three)  times daily as needed for dizziness.   Melatonin 10 MG Tabs Take 10 mg by mouth at bedtime as needed (sleep).   metFORMIN 500 MG 24 hr tablet Commonly known as: GLUCOPHAGE-XR Take 500 mg by mouth daily.   pantoprazole 20 MG tablet Commonly known as: PROTONIX Take 20 mg by mouth daily before breakfast.   rosuvastatin 10 MG tablet Commonly known as: Crestor Take 1 tablet (10 mg total) by mouth daily.   tamsulosin 0.4 MG Caps capsule Commonly known as: FLOMAX Take 1 capsule (0.4 mg total) by mouth daily after supper.   vitamin B-12 500 MCG tablet Commonly known as: CYANOCOBALAMIN Take 500 mcg by mouth daily.        Consultations: Cardiology  Procedures/Studies: Left heart catheterization on 01/24/2022   Prox LAD lesion is 50% stenosed.   1st Diag lesion is 50% stenosed.   Prox RCA to Mid RCA lesion is 15% stenosed.   RPDA lesion is 90% stenosed.   A drug-eluting stent was successfully placed using a STENT ONYX FRONTIER 2.5X12.   Post intervention, there is a 0% residual stenosis.   LV end diastolic pressure is normal.   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 Anticipate DC in am   CARDIAC CATHETERIZATION  Result Date: 01/24/2022   Prox LAD lesion is 50% stenosed.   1st Diag lesion is 50% stenosed.   Prox RCA to Mid RCA lesion is 15% stenosed.   RPDA lesion is 90% stenosed.   A drug-eluting stent was successfully placed using a STENT ONYX FRONTIER 2.5X12.   Post intervention, there is a 0% residual stenosis.   LV end diastolic pressure is normal. 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 Anticipate DC in am   ECHOCARDIOGRAM COMPLETE  Result Date: 01/21/2022    ECHOCARDIOGRAM REPORT   Patient Name:   Jeremiah [name] Date of Exam: 01/21/2022 Medical Rec #:  [id number]       Height:        70.0 in Accession #:    [id number]  Weight:       196.9 lb Date of Birth:  Aug 18, 1940       BSA:          2.074 m Patient Age:    81 years        BP:           174/100 mmHg Patient Gender: M               HR:           79 bpm. Exam Location:  Inpatient Procedure: 2D Echo, Cardiac Doppler and Color Doppler                              MODIFIED REPORT: This report was modified by Cherlynn Kaiser MD on 01/21/2022 due to revision.  Indications:     Chest pain  History:         Patient has prior history of Echocardiogram examinations, most                  recent 12/15/2018. Risk Factors:Sleep Apnea, Diabetes and                  Dyslipidemia.  Sonographer:     Clayton Lefort RDCS (AE) Referring Phys:  [id number] [facility] Diagnosing Phys: Cherlynn Kaiser MD IMPRESSIONS  1. Left ventricular ejection fraction, by estimation, is 55 to 60%. The left ventricle has normal function. Left ventricular endocardial border not optimally defined to evaluate regional wall motion. Left ventricular diastolic parameters are indeterminate.  2. Right ventricular systolic function is mildly reduced with hypokinesis of the mid ventricle and relative apical sparing. The right ventricular size is mildly enlarged. IVC not assessed, cannot calculate RVSP.  3. Dilated coronary sinus.  4. The mitral valve is normal in structure. Mild mitral valve regurgitation. No evidence of mitral stenosis.  5. The aortic valve is tricuspid. Aortic valve regurgitation is mild to moderate. No aortic stenosis is present.  6. Aortic dilatation noted. 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 FINDINGS  Left Ventricle: Left ventricular ejection fraction, by estimation, is 55 to 60%. The left ventricle has normal function. Left ventricular endocardial border not optimally defined to evaluate regional wall motion. The left ventricular internal cavity size was normal in size. There is no left ventricular hypertrophy. Left ventricular diastolic  parameters are indeterminate. Right Ventricle: The right ventricular size is mildly enlarged. No increase in right ventricular wall thickness. Right ventricular systolic function is mildly reduced. Tricuspid regurgitation signal is inadequate for assessing PA pressure. Left Atrium: Left atrial size was normal in size. Right Atrium: Right atrial size was normal in size. Pericardium: There is no evidence of pericardial effusion. Presence of epicardial fat layer. Mitral Valve: The mitral valve is normal in structure. Mild mitral annular calcification. Mild mitral valve regurgitation. No evidence of mitral valve stenosis. Tricuspid Valve: The tricuspid valve is normal in structure. Tricuspid valve regurgitation is trivial. No evidence of tricuspid stenosis. Aortic Valve: The aortic valve is tricuspid. Aortic valve regurgitation is mild to moderate. No aortic stenosis is present. Aortic valve mean gradient measures 1.0 mmHg. Aortic valve peak gradient measures 2.7 mmHg. Aortic valve area, by VTI measures 3.09 cm. Pulmonic Valve: The pulmonic valve was normal in structure. Pulmonic valve regurgitation is trivial. No evidence of pulmonic stenosis. Aorta: Aortic dilatation noted. 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 Venous: The inferior  vena cava was not well visualized. IAS/Shunts: No atrial level shunt detected by color flow Doppler. Additional Comments: A is visualized in the inferior vena cava.  LEFT VENTRICLE PLAX 2D LVIDd:         4.30 cm   Diastology LVIDs:         2.70 cm   LV e' medial:    3.26 cm/s LV PW:         1.50 cm   LV E/e' medial:  7.3 LV IVS:        1.60 cm   LV e' lateral:   2.83 cm/s LVOT diam:     2.10 cm   LV E/e' lateral: 8.4 LV SV:         49 LV SV Index:   24 LVOT Area:     3.46 cm  RIGHT VENTRICLE TAPSE (M-mode): 0.9 cm LEFT ATRIUM           Index LA diam:      2.90 cm 1.40 cm/m LA Vol (A4C): 33.5 ml 16.16 ml/m  AORTIC VALVE AV Area (Vmax):    3.04 cm AV Area (Vmean):    3.00 cm AV Area (VTI):     3.09 cm AV Vmax:           82.70 cm/s AV Vmean:          55.300 cm/s AV VTI:            0.159 m AV Peak Grad:      2.7 mmHg AV Mean Grad:      1.0 mmHg LVOT Vmax:         72.50 cm/s LVOT Vmean:        47.900 cm/s LVOT VTI:          0.142 m LVOT/AV VTI ratio: 0.89  AORTA Ao Root diam: 4.20 cm Ao Asc diam:  3.90 cm MITRAL VALVE MV Area (PHT): 7.44 cm    SHUNTS MV Decel Time: 102 msec    Systemic VTI:  0.14 m MV E velocity: 23.70 cm/s  Systemic Diam: 2.10 cm MV A velocity: 77.10 cm/s MV E/A ratio:  0.31 Cherlynn Kaiser MD Electronically signed by Cherlynn Kaiser MD Signature Date/Time: 01/21/2022/1:07:33 PM    Final (Updated)    DG Chest 2 View  Result Date: 01/20/2022 CLINICAL DATA:  Chest pain EXAM: CHEST - 2 VIEW COMPARISON:  08/13/2021 FINDINGS: Stable elevation of the right hemidiaphragm. Mild left basilar parenchymal scarring. No superimposed focal pulmonary infiltrate. No pneumothorax or pleural effusion. Cardiac size is within normal limits. Ventriculoperitoneal shunt catheter tubing overlies the right hemithorax, unchanged. No acute bone abnormality. IMPRESSION: No radiographic evidence of acute cardiopulmonary disease. Electronically Signed   By: Fidela Salisbury M.D.   On: 01/20/2022 22:37       The results of significant diagnostics from this hospitalization (including imaging, microbiology, ancillary and laboratory) are listed below for reference.     Microbiology: No results found for this or any previous visit (from the past 240 hour(s)).   Labs:  CBC: Recent Labs  Lab 01/20/22 2150 01/22/22 0600 01/23/22 0224 01/24/22 0242 01/25/22 0604  WBC 7.2 8.3 8.1 9.5 10.7*  HGB 15.2 15.1 14.6 14.0 15.0  HCT 48.5 46.5 46.7 44.3 49.6  MCV 100.0 97.5 98.1 100.7* 103.5*  PLT 166 143* 143* 134* 141*   BMP &GFR Recent Labs  Lab 01/21/22 1202 01/22/22 0600 01/23/22 0224 01/24/22 0242 01/25/22 0604  NA 143 140 143 142 144  K 3.8  4.0 3.8 4.0 4.4  CL 101  103 105 104 109  CO2 35* 28 32 30 27  GLUCOSE 175* 159* 142* 163* 155*  BUN 17 19 27* 27* 25*  CREATININE 1.13 1.21 1.38* 1.40* 1.36*  CALCIUM 9.5 8.8* 8.7* 8.4* 8.6*  MG  --  1.6* 2.2 2.0  --   PHOS  --  3.3 4.3 4.1  --    Estimated Creatinine Clearance: 47 mL/min (A) (by C-G formula based on SCr of 1.36 mg/dL (H)). Liver & Pancreas: Recent Labs  Lab 01/21/22 1202 01/22/22 0600 01/23/22 0224 01/24/22 0242  AST 20  --   --   --   ALT 11  --   --   --   ALKPHOS 55  --   --   --   BILITOT 0.7  --   --   --   PROT 6.1*  --   --   --   ALBUMIN 3.5 3.3* 3.1* 2.9*   Recent Labs  Lab 01/21/22 1202  LIPASE 35   No results for input(s): "AMMONIA" in the last 168 hours. Diabetic: No results for input(s): "HGBA1C" in the last 72 hours. Recent Labs  Lab 01/24/22 1456 01/24/22 1617 01/24/22 2124 01/25/22 0631 01/25/22 1110  GLUCAP 118* 106* 162* 147* 139*   Cardiac Enzymes: No results for input(s): "CKTOTAL", "CKMB", "CKMBINDEX", "TROPONINI" in the last 168 hours. No results for input(s): "PROBNP" in the last 8760 hours. Coagulation Profile: No results for input(s): "INR", "PROTIME" in the last 168 hours. Thyroid Function Tests: No results for input(s): "TSH", "T4TOTAL", "FREET4", "T3FREE", "THYROIDAB" in the last 72 hours. Lipid Profile: No results for input(s): "CHOL", "HDL", "LDLCALC", "TRIG", "CHOLHDL", "LDLDIRECT" in the last 72 hours. Anemia Panel: No results for input(s): "VITAMINB12", "FOLATE", "FERRITIN", "TIBC", "IRON", "RETICCTPCT" in the last 72 hours. Urine analysis:    Component Value Date/Time   COLORURINE YELLOW 08/12/2021 2308   APPEARANCEUR CLEAR 08/12/2021 2308   LABSPEC 1.020 08/12/2021 2308   PHURINE 6.0 08/12/2021 2308   GLUCOSEU 50 (A) 08/12/2021 2308   HGBUR NEGATIVE 08/12/2021 2308   BILIRUBINUR NEGATIVE 08/12/2021 2308   KETONESUR NEGATIVE 08/12/2021 2308   PROTEINUR 30 (A) 08/12/2021 2308   UROBILINOGEN 1.0 08/25/2008 0326   NITRITE  NEGATIVE 08/12/2021 2308   LEUKOCYTESUR NEGATIVE 08/12/2021 2308   Sepsis Labs: Invalid input(s): "PROCALCITONIN", "LACTICIDVEN"   SIGNED:  Mercy Riding, MD  [facility] 01/25/2022, 3:27 PM

## 2022-01-25 NOTE — Evaluation (Signed)
Physical Therapy Evaluation Patient Details Name: Jeremiah [name] MRN: [id number] DOB: [birth year] Today's Date: 01/25/2022  History of Present Illness  Pt is an 81 y/o male admitted 01/20/22 for chest pain. EKG showed R bundle branch block. Admitted for elevated troponin in setting of uncontrolled HTN. S/p LHC and coronary angiography 9/5. PMH includes BPH, DM, HTN, OSA on CPAP, AAA, prostate CA s/p raditation, NPH s/p VP shunt.   Clinical Impression  Pt presents with an overall decrease in functional mobility secondary to above. PTA, pt independent, lives with wife whom he helps care for due to her dementia; has PRN CNA and family assist. Today, pt moving well without assist, main limitation is apparent cognitive impairment, including decreased attention, memory and awareness. Pt preparing for potential d/c home today, recommend increased initial supervision from family for safety, med management, transportation, etc. If to remain admitted, will continue to follow acutely to address established goals.    SpO2 down to 82% on RA with ambulation, requiring 2L O2 [place] for >/92%   Recommendations for follow up therapy are one component of a multi-disciplinary discharge planning process, led by the attending physician.  Recommendations may be updated based on patient status, additional functional criteria and insurance authorization.  Follow Up Recommendations No PT follow up      Assistance Recommended at Discharge Frequent or constant Supervision/Assistance  Patient can return home with the following  Direct supervision/assist for medications management;Direct supervision/assist for financial management;Assist for transportation    Equipment Recommendations None recommended by PT  Recommendations for Other Services       Functional Status Assessment Patient has had a recent decline in their functional status and demonstrates the ability to make significant improvements in function in a reasonable  and predictable amount of time.     Precautions / Restrictions Precautions Precautions: Fall;Other (comment) Precaution Comments: Watch SpO2 (does not wear baseline) Restrictions Weight Bearing Restrictions: No      Mobility  Bed Mobility               General bed mobility comments: received sitting in recliner    Transfers Overall transfer level: Needs assistance Equipment used: None Transfers: Sit to/from Stand Sit to Stand: Supervision           General transfer comment: supervision for safety/lines    Ambulation/Gait Ambulation/Gait assistance: Supervision Gait Distance (Feet): 240 Feet Assistive device: None Gait Pattern/deviations: Step-through pattern, Decreased stride length Gait velocity: Decreased     General Gait Details: slow, steady gait without DME; supervision for safety/lines. SpO2 down to 82% on RA with reliable pleth  Stairs            Wheelchair Mobility    Modified Rankin (Stroke Patients Only)       Balance Overall balance assessment: Needs assistance Sitting-balance support: No upper extremity supported, Feet supported Sitting balance-Leahy Scale: Good     Standing balance support: No upper extremity supported, During functional activity Standing balance-Leahy Scale: Good                               Pertinent Vitals/Pain      Home Living Family/patient expects to be discharged to:: Private residence Living Arrangements: Spouse/significant other Available Help at Discharge: Family;Available PRN/intermittently Type of Home: House Home Access: Stairs to enter Entrance Stairs-Rails: Can reach both;Left;Right Entrance Stairs-Number of Steps: 2 Alternate Level Stairs-Number of Steps: stays on main level Home Layout: Multi-level;Able to live  on main level with bedroom/bathroom Home Equipment: Grab bars - tub/shower;Hand held shower head;Cane - single point;Rolling Walker (2 wheels);Shower seat - built  in Additional Comments: spouse has dementia, has CNA to help 2x/ week and children PRN    Prior Function Prior Level of Function : Independent/Modified Independent             Mobility Comments: independent ambulation without DME ADLs Comments: drives, cares for spouse (mostly cog); reports has assist with meds, meals and cleaning at baseline     Hand Dominance   Dominant Hand: Right    Extremity/Trunk Assessment   Upper Extremity Assessment Upper Extremity Assessment: Overall WFL for tasks assessed    Lower Extremity Assessment Lower Extremity Assessment: Overall WFL for tasks assessed    Cervical / Trunk Assessment Cervical / Trunk Assessment: Normal  Communication   Communication: No difficulties  Cognition Arousal/Alertness: Awake/alert Behavior During Therapy: WFL for tasks assessed/performed Overall Cognitive Status: Impaired/Different from baseline Area of Impairment: Orientation, Attention, Memory, Following commands, Safety/judgement, Awareness, Problem solving                 Orientation Level: Disoriented to, Time, Place Current Attention Level: Sustained Memory: Decreased recall of precautions, Decreased short-term memory Following Commands: Follows one step commands consistently, Follows one step commands with increased time, Follows multi-step commands inconsistently Safety/Judgement: Decreased awareness of safety, Decreased awareness of deficits Awareness: Intellectual, Emergent Problem Solving: Slow processing, Decreased initiation, Requires verbal cues, Difficulty sequencing General Comments: pt with confusion, unable to recall location or date despite reorientation during OT session; improved time to recall later in session with cuing. Demonstrates improving awareness of cognitive deficits, pt asking if this is normal after procedure he had. Continues to state he is in medical office.        General Comments General comments (skin integrity,  edema, etc.): SpO2 down to 82% on RA with ambulation; >/92% on 2L O2 with ambulation. Pt moving well but demonstrates cognitive impairments, increased time discussing safety at home while cognition clears, including increased assist from children, no driving    Exercises     Assessment/Plan    PT Assessment Patient needs continued PT services  PT Problem List Decreased mobility;Decreased cognition       PT Treatment Interventions DME instruction;Gait training;Stair training;Functional mobility training;Therapeutic activities;Therapeutic exercise;Balance training;Patient/family education;Cognitive remediation    PT Goals (Current goals can be found in the Care Plan section)  Acute Rehab PT Goals Patient Stated Goal: return home PT Goal Formulation: With patient Time For Goal Achievement: 02/08/22 Potential to Achieve Goals: Good    Frequency Min 3X/week     Co-evaluation               AM-PAC PT "6 Clicks" Mobility  Outcome Measure Help needed turning from your back to your side while in a flat bed without using bedrails?: None Help needed moving from lying on your back to sitting on the side of a flat bed without using bedrails?: None Help needed moving to and from a bed to a chair (including a wheelchair)?: A Little Help needed standing up from a chair using your arms (e.g., wheelchair or bedside chair)?: A Little Help needed to walk in hospital room?: A Little Help needed climbing 3-5 steps with a railing? : A Little 6 Click Score: 20    End of Session Equipment Utilized During Treatment: Gait belt;Oxygen Activity Tolerance: Patient tolerated treatment well Patient left: in chair;with call bell/phone within reach Nurse Communication: Mobility status  PT Visit Diagnosis: Other abnormalities of gait and mobility (R26.89)    Time: 0902-0920 PT Time Calculation (min) (ACUTE ONLY): 18 min   Charges:   PT Evaluation $PT Eval Low Complexity: [place], PT, DPT Acute Rehabilitation Services  Personal: [facility] Office:  [facility] 01/25/2022, 10:17 AM

## 2022-01-26 ENCOUNTER — Telehealth: Payer: Self-pay | Admitting: *Deleted

## 2022-01-26 LAB — LIPOPROTEIN A (LPA): Lipoprotein (a): 66.2 nmol/L — ABNORMAL HIGH (ref <75.0)

## 2022-01-26 NOTE — Chronic Care Management (AMB) (Signed)
  Care Coordination  Outreach Note  01/26/2022 Name: Jeremiah [name] MRN: [id number] DOB: 01-27-1941   Care Coordination Outreach Attempts: An unsuccessful telephone outreach was attempted today to offer the patient information about available care coordination services as a benefit of their health plan.   Follow Up Plan:  Additional outreach attempts will be made to offer the patient care coordination information and services.   Encounter Outcome:  No Answer  [place]  Direct Dial: [phone]

## 2022-01-27 ENCOUNTER — Emergency Department (HOSPITAL_COMMUNITY): Payer: Medicare Other

## 2022-01-27 ENCOUNTER — Inpatient Hospital Stay (HOSPITAL_COMMUNITY)
Admission: EM | Admit: 2022-01-27 | Discharge: 2022-02-02 | DRG: 189 | Disposition: A | Discharge status: Home Health | Payer: Medicare Other | Attending: Family Medicine | Admitting: Family Medicine

## 2022-01-27 DIAGNOSIS — Z8546 Personal history of malignant neoplasm of prostate: Secondary | ICD-10-CM | POA: Diagnosis not present

## 2022-01-27 DIAGNOSIS — N400 Enlarged prostate without lower urinary tract symptoms: Secondary | ICD-10-CM | POA: Diagnosis not present

## 2022-01-27 DIAGNOSIS — Z888 Allergy status to other drugs, medicaments and biological substances status: Secondary | ICD-10-CM

## 2022-01-27 DIAGNOSIS — I110 Hypertensive heart disease with heart failure: Secondary | ICD-10-CM | POA: Diagnosis not present

## 2022-01-27 DIAGNOSIS — I2510 Atherosclerotic heart disease of native coronary artery without angina pectoris: Secondary | ICD-10-CM | POA: Diagnosis present

## 2022-01-27 DIAGNOSIS — I252 Old myocardial infarction: Secondary | ICD-10-CM

## 2022-01-27 DIAGNOSIS — E785 Hyperlipidemia, unspecified: Secondary | ICD-10-CM | POA: Diagnosis present

## 2022-01-27 DIAGNOSIS — R4182 Altered mental status, unspecified: Secondary | ICD-10-CM | POA: Diagnosis not present

## 2022-01-27 DIAGNOSIS — M47812 Spondylosis without myelopathy or radiculopathy, cervical region: Secondary | ICD-10-CM | POA: Diagnosis not present

## 2022-01-27 DIAGNOSIS — R531 Weakness: Secondary | ICD-10-CM | POA: Diagnosis not present

## 2022-01-27 DIAGNOSIS — Z20822 Contact with and (suspected) exposure to covid-19: Secondary | ICD-10-CM | POA: Diagnosis not present

## 2022-01-27 DIAGNOSIS — I959 Hypotension, unspecified: Secondary | ICD-10-CM | POA: Diagnosis not present

## 2022-01-27 DIAGNOSIS — R778 Other specified abnormalities of plasma proteins: Secondary | ICD-10-CM

## 2022-01-27 DIAGNOSIS — T361X5A Adverse effect of cephalosporins and other beta-lactam antibiotics, initial encounter: Secondary | ICD-10-CM | POA: Diagnosis not present

## 2022-01-27 DIAGNOSIS — R410 Disorientation, unspecified: Secondary | ICD-10-CM

## 2022-01-27 DIAGNOSIS — J302 Other seasonal allergic rhinitis: Secondary | ICD-10-CM | POA: Diagnosis not present

## 2022-01-27 DIAGNOSIS — R4701 Aphasia: Secondary | ICD-10-CM | POA: Diagnosis not present

## 2022-01-27 DIAGNOSIS — I6523 Occlusion and stenosis of bilateral carotid arteries: Secondary | ICD-10-CM | POA: Diagnosis not present

## 2022-01-27 DIAGNOSIS — Z955 Presence of coronary angioplasty implant and graft: Secondary | ICD-10-CM

## 2022-01-27 DIAGNOSIS — E119 Type 2 diabetes mellitus without complications: Secondary | ICD-10-CM

## 2022-01-27 DIAGNOSIS — R0689 Other abnormalities of breathing: Secondary | ICD-10-CM | POA: Diagnosis not present

## 2022-01-27 DIAGNOSIS — J9601 Acute respiratory failure with hypoxia: Secondary | ICD-10-CM | POA: Diagnosis not present

## 2022-01-27 DIAGNOSIS — K219 Gastro-esophageal reflux disease without esophagitis: Secondary | ICD-10-CM | POA: Diagnosis not present

## 2022-01-27 DIAGNOSIS — J189 Pneumonia, unspecified organism: Secondary | ICD-10-CM | POA: Diagnosis not present

## 2022-01-27 DIAGNOSIS — R9401 Abnormal electroencephalogram [EEG]: Secondary | ICD-10-CM | POA: Diagnosis not present

## 2022-01-27 DIAGNOSIS — Z79899 Other long term (current) drug therapy: Secondary | ICD-10-CM | POA: Diagnosis not present

## 2022-01-27 DIAGNOSIS — R140 Abdominal distension (gaseous): Secondary | ICD-10-CM | POA: Diagnosis not present

## 2022-01-27 DIAGNOSIS — G9389 Other specified disorders of brain: Secondary | ICD-10-CM | POA: Diagnosis not present

## 2022-01-27 DIAGNOSIS — F0390 Unspecified dementia without behavioral disturbance: Secondary | ICD-10-CM | POA: Diagnosis present

## 2022-01-27 DIAGNOSIS — Z833 Family history of diabetes mellitus: Secondary | ICD-10-CM

## 2022-01-27 DIAGNOSIS — G919 Hydrocephalus, unspecified: Secondary | ICD-10-CM | POA: Diagnosis not present

## 2022-01-27 DIAGNOSIS — Z923 Personal history of irradiation: Secondary | ICD-10-CM

## 2022-01-27 DIAGNOSIS — J9621 Acute and chronic respiratory failure with hypoxia: Secondary | ICD-10-CM

## 2022-01-27 DIAGNOSIS — Z85828 Personal history of other malignant neoplasm of skin: Secondary | ICD-10-CM | POA: Diagnosis not present

## 2022-01-27 DIAGNOSIS — R270 Ataxia, unspecified: Secondary | ICD-10-CM | POA: Diagnosis present

## 2022-01-27 DIAGNOSIS — Z87442 Personal history of urinary calculi: Secondary | ICD-10-CM

## 2022-01-27 DIAGNOSIS — G4733 Obstructive sleep apnea (adult) (pediatric): Secondary | ICD-10-CM | POA: Diagnosis present

## 2022-01-27 DIAGNOSIS — R609 Edema, unspecified: Secondary | ICD-10-CM | POA: Diagnosis not present

## 2022-01-27 DIAGNOSIS — R29818 Other symptoms and signs involving the nervous system: Secondary | ICD-10-CM | POA: Diagnosis not present

## 2022-01-27 DIAGNOSIS — G928 Other toxic encephalopathy: Secondary | ICD-10-CM | POA: Diagnosis not present

## 2022-01-27 DIAGNOSIS — Z982 Presence of cerebrospinal fluid drainage device: Secondary | ICD-10-CM | POA: Diagnosis not present

## 2022-01-27 DIAGNOSIS — R0902 Hypoxemia: Secondary | ICD-10-CM | POA: Diagnosis not present

## 2022-01-27 DIAGNOSIS — Z87891 Personal history of nicotine dependence: Secondary | ICD-10-CM | POA: Diagnosis not present

## 2022-01-27 DIAGNOSIS — Z8249 Family history of ischemic heart disease and other diseases of the circulatory system: Secondary | ICD-10-CM

## 2022-01-27 DIAGNOSIS — Y95 Nosocomial condition: Secondary | ICD-10-CM | POA: Diagnosis present

## 2022-01-27 DIAGNOSIS — Z7902 Long term (current) use of antithrombotics/antiplatelets: Secondary | ICD-10-CM

## 2022-01-27 DIAGNOSIS — N179 Acute kidney failure, unspecified: Secondary | ICD-10-CM | POA: Diagnosis present

## 2022-01-27 DIAGNOSIS — G929 Unspecified toxic encephalopathy: Secondary | ICD-10-CM | POA: Diagnosis not present

## 2022-01-27 DIAGNOSIS — G912 (Idiopathic) normal pressure hydrocephalus: Secondary | ICD-10-CM | POA: Diagnosis present

## 2022-01-27 DIAGNOSIS — I5032 Chronic diastolic (congestive) heart failure: Secondary | ICD-10-CM

## 2022-01-27 DIAGNOSIS — Z7984 Long term (current) use of oral hypoglycemic drugs: Secondary | ICD-10-CM

## 2022-01-27 DIAGNOSIS — J90 Pleural effusion, not elsewhere classified: Secondary | ICD-10-CM | POA: Diagnosis not present

## 2022-01-27 DIAGNOSIS — I6529 Occlusion and stenosis of unspecified carotid artery: Secondary | ICD-10-CM | POA: Diagnosis not present

## 2022-01-27 DIAGNOSIS — Z7982 Long term (current) use of aspirin: Secondary | ICD-10-CM

## 2022-01-27 DIAGNOSIS — T8509XA Other mechanical complication of ventricular intracranial (communicating) shunt, initial encounter: Secondary | ICD-10-CM | POA: Diagnosis not present

## 2022-01-27 LAB — CBC WITH DIFFERENTIAL/PLATELET
Abs Immature Granulocytes: 0.07 10*3/uL (ref 0.00–0.07)
Basophils Absolute: 0 10*3/uL (ref 0.0–0.1)
Basophils Relative: 1 %
Eosinophils Absolute: 0.2 10*3/uL (ref 0.0–0.5)
Eosinophils Relative: 3 %
HCT: 45.7 % (ref 39.0–52.0)
Hemoglobin: 13.7 g/dL (ref 13.0–17.0)
Immature Granulocytes: 1 %
Lymphocytes Relative: 12 %
Lymphs Abs: 0.8 10*3/uL (ref 0.7–4.0)
MCH: 31.6 pg (ref 26.0–34.0)
MCHC: 30 g/dL (ref 30.0–36.0)
MCV: 105.5 fL — ABNORMAL HIGH (ref 80.0–100.0)
Monocytes Absolute: 0.8 10*3/uL (ref 0.1–1.0)
Monocytes Relative: 12 %
Neutro Abs: 4.7 10*3/uL (ref 1.7–7.7)
Neutrophils Relative %: 71 %
Platelets: 150 10*3/uL (ref 150–400)
RBC: 4.33 MIL/uL (ref 4.22–5.81)
RDW: 14.6 % (ref 11.5–15.5)
WBC: 6.6 10*3/uL (ref 4.0–10.5)
nRBC: 0 % (ref 0.0–0.2)

## 2022-01-27 LAB — TROPONIN I (HIGH SENSITIVITY)
Troponin I (High Sensitivity): 135 ng/L (ref <18)
Troponin I (High Sensitivity): 133 ng/L (ref <18)

## 2022-01-27 LAB — I-STAT VENOUS BLOOD GAS, ED
Acid-Base Excess: 2 mmol/L (ref 0.0–2.0)
Bicarbonate: 28.8 mmol/L — ABNORMAL HIGH (ref 20.0–28.0)
Calcium, Ion: 1.04 mmol/L — ABNORMAL LOW (ref 1.15–1.40)
HCT: 41 % (ref 39.0–52.0)
Hemoglobin: 13.9 g/dL (ref 13.0–17.0)
O2 Saturation: 96 %
Potassium: 4.1 mmol/L (ref 3.5–5.1)
Sodium: 143 mmol/L (ref 135–145)
TCO2: 30 mmol/L (ref 22–32)
pCO2, Ven: 53.1 mmHg (ref 44–60)
pH, Ven: 7.342 (ref 7.25–7.43)
pO2, Ven: 85 mmHg — ABNORMAL HIGH (ref 32–45)

## 2022-01-27 LAB — BASIC METABOLIC PANEL
Anion gap: 11 (ref 5–15)
BUN: 43 mg/dL — ABNORMAL HIGH (ref 8–23)
CO2: 27 mmol/L (ref 22–32)
Calcium: 8.3 mg/dL — ABNORMAL LOW (ref 8.9–10.3)
Chloride: 107 mmol/L (ref 98–111)
Creatinine, Ser: 1.62 mg/dL — ABNORMAL HIGH (ref 0.61–1.24)
GFR, Estimated: 42 mL/min — ABNORMAL LOW (ref >60)
Glucose, Bld: 141 mg/dL — ABNORMAL HIGH (ref 70–99)
Potassium: 4.2 mmol/L (ref 3.5–5.1)
Sodium: 145 mmol/L (ref 135–145)

## 2022-01-27 LAB — URINALYSIS, ROUTINE W REFLEX MICROSCOPIC
Bacteria, UA: NONE SEEN
Bilirubin Urine: NEGATIVE
Glucose, UA: >=500 mg/dL — AB
Hgb urine dipstick: NEGATIVE
Ketones, ur: NEGATIVE mg/dL
Leukocytes,Ua: NEGATIVE
Nitrite: NEGATIVE
Protein, ur: NEGATIVE mg/dL
Specific Gravity, Urine: 1.025 (ref 1.005–1.030)
pH: 5 (ref 5.0–8.0)

## 2022-01-27 LAB — CBC
HCT: 42 % (ref 39.0–52.0)
Hemoglobin: 12.9 g/dL — ABNORMAL LOW (ref 13.0–17.0)
MCH: 32.1 pg (ref 26.0–34.0)
MCHC: 30.7 g/dL (ref 30.0–36.0)
MCV: 104.5 fL — ABNORMAL HIGH (ref 80.0–100.0)
Platelets: 143 10*3/uL — ABNORMAL LOW (ref 150–400)
RBC: 4.02 MIL/uL — ABNORMAL LOW (ref 4.22–5.81)
RDW: 14.6 % (ref 11.5–15.5)
WBC: 6.4 10*3/uL (ref 4.0–10.5)
nRBC: 0 % (ref 0.0–0.2)

## 2022-01-27 LAB — CREATININE, SERUM
Creatinine, Ser: 1.67 mg/dL — ABNORMAL HIGH (ref 0.61–1.24)
GFR, Estimated: 41 mL/min — ABNORMAL LOW (ref >60)

## 2022-01-27 LAB — RESP PANEL BY RT-PCR (FLU A&B, COVID) ARPGX2
Influenza A by PCR: NEGATIVE
Influenza B by PCR: NEGATIVE
SARS Coronavirus 2 by RT PCR: NEGATIVE

## 2022-01-27 LAB — D-DIMER, QUANTITATIVE: D-Dimer, Quant: 0.87 ug/mL-FEU — ABNORMAL HIGH (ref 0.00–0.50)

## 2022-01-27 LAB — MRSA NEXT GEN BY PCR, NASAL: MRSA by PCR Next Gen: NOT DETECTED

## 2022-01-27 LAB — CBG MONITORING, ED: Glucose-Capillary: 95 mg/dL (ref 70–99)

## 2022-01-27 MED ORDER — ROSUVASTATIN CALCIUM 5 MG PO TABS
10.0000 mg | ORAL_TABLET | Freq: Every day | ORAL | Status: DC
  Administered 2022-01-28 – 2022-02-02 (×5): 10 mg via ORAL
  Filled 2022-01-27 (×5): qty 2

## 2022-01-27 MED ORDER — POLYETHYLENE GLYCOL 3350 17 G PO PACK: 17.0000 g | PACK | Freq: Every day | ORAL | Status: DC | PRN

## 2022-01-27 MED ORDER — IOHEXOL 350 MG/ML SOLN
100.0000 mL | Freq: Once | INTRAVENOUS | Status: AC | PRN
  Administered 2022-01-27: 80 mL via INTRAVENOUS

## 2022-01-27 MED ORDER — MELATONIN 5 MG PO TABS: 5.0000 mg | ORAL_TABLET | Freq: Every evening | ORAL | Status: DC | PRN

## 2022-01-27 MED ORDER — VANCOMYCIN HCL IN DEXTROSE 1-5 GM/200ML-% IV SOLN: 1000.0000 mg | INTRAVENOUS | Status: DC

## 2022-01-27 MED ORDER — CARVEDILOL 12.5 MG PO TABS
12.5000 mg | ORAL_TABLET | Freq: Two times a day (BID) | ORAL | Status: DC
  Administered 2022-01-27 – 2022-02-02 (×10): 12.5 mg via ORAL
  Filled 2022-01-27 (×10): qty 1

## 2022-01-27 MED ORDER — VITAMIN B-12 1000 MCG PO TABS
500.0000 ug | ORAL_TABLET | Freq: Every day | ORAL | Status: DC
Start: 2022-01-28 — End: 2022-02-02
  Administered 2022-01-28 – 2022-02-02 (×5): 500 ug via ORAL
  Filled 2022-01-27 (×6): qty 1

## 2022-01-27 MED ORDER — CEFEPIME HCL 2 G IV SOLR
2.0000 g | Freq: Once | INTRAVENOUS | Status: AC
  Administered 2022-01-27: 2 g via INTRAVENOUS
  Filled 2022-01-27: qty 12.5

## 2022-01-27 MED ORDER — VANCOMYCIN HCL 1750 MG/350ML IV SOLN
1750.0000 mg | Freq: Once | INTRAVENOUS | Status: AC
  Administered 2022-01-27: 1750 mg via INTRAVENOUS
  Filled 2022-01-27: qty 350

## 2022-01-27 MED ORDER — CLOPIDOGREL BISULFATE 75 MG PO TABS
75.0000 mg | ORAL_TABLET | Freq: Every day | ORAL | Status: DC
  Administered 2022-01-28 – 2022-02-02 (×5): 75 mg via ORAL
  Filled 2022-01-27 (×5): qty 1

## 2022-01-27 MED ORDER — INSULIN ASPART 100 UNIT/ML IJ SOLN
0.0000 [IU] | Freq: Three times a day (TID) | INTRAMUSCULAR | Status: DC
  Administered 2022-01-28 – 2022-01-29 (×2): 2 [IU] via SUBCUTANEOUS
  Administered 2022-01-29: 1 [IU] via SUBCUTANEOUS
  Administered 2022-01-30 (×2): 2 [IU] via SUBCUTANEOUS
  Administered 2022-01-30 – 2022-01-31 (×3): 1 [IU] via SUBCUTANEOUS
  Administered 2022-02-01: 2 [IU] via SUBCUTANEOUS
  Administered 2022-02-02: 3 [IU] via SUBCUTANEOUS

## 2022-01-27 MED ORDER — ASPIRIN 81 MG PO TBEC
81.0000 mg | DELAYED_RELEASE_TABLET | Freq: Every day | ORAL | Status: DC
  Administered 2022-01-28 – 2022-02-02 (×5): 81 mg via ORAL
  Filled 2022-01-27 (×5): qty 1

## 2022-01-27 MED ORDER — ACETAMINOPHEN 325 MG PO TABS: 650.0000 mg | ORAL_TABLET | Freq: Four times a day (QID) | ORAL | Status: DC | PRN

## 2022-01-27 MED ORDER — EZETIMIBE 10 MG PO TABS
10.0000 mg | ORAL_TABLET | Freq: Every day | ORAL | Status: DC
  Administered 2022-01-28 – 2022-02-02 (×5): 10 mg via ORAL
  Filled 2022-01-27 (×5): qty 1

## 2022-01-27 MED ORDER — ONDANSETRON HCL 4 MG/2ML IJ SOLN: 4.0000 mg | Freq: Four times a day (QID) | INTRAMUSCULAR | Status: DC | PRN

## 2022-01-27 MED ORDER — PANTOPRAZOLE SODIUM 20 MG PO TBEC
20.0000 mg | DELAYED_RELEASE_TABLET | Freq: Every day | ORAL | Status: DC
  Administered 2022-01-28 – 2022-02-02 (×5): 20 mg via ORAL
  Filled 2022-01-27 (×5): qty 1

## 2022-01-27 MED ORDER — SODIUM CHLORIDE 0.9 % IV SOLN
2.0000 g | Freq: Two times a day (BID) | INTRAVENOUS | Status: DC
  Administered 2022-01-28 – 2022-01-31 (×7): 2 g via INTRAVENOUS
  Filled 2022-01-27 (×7): qty 12.5

## 2022-01-27 MED ORDER — ENOXAPARIN SODIUM 40 MG/0.4ML IJ SOSY
40.0000 mg | PREFILLED_SYRINGE | INTRAMUSCULAR | Status: DC
  Administered 2022-01-28 – 2022-02-02 (×6): 40 mg via SUBCUTANEOUS
  Filled 2022-01-27 (×6): qty 0.4

## 2022-01-27 MED ORDER — INSULIN ASPART 100 UNIT/ML IJ SOLN: 0.0000 [IU] | Freq: Every day | INTRAMUSCULAR | Status: DC

## 2022-01-27 NOTE — Progress Notes (Signed)
Pharmacy Antibiotic Note  Jeremiah [name] is a 81 y.o. male for which pharmacy has been consulted for vancomycin dosing for pneumonia.  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 Patient presenting with SOB.  Cefepime x 1 dose given in ED.  SCr 1.62 - up from 1.36 on 9/6 WBC 6.6; T afeb; HR 79; RR 22 COVID/Flu - neg  Plan: Cefepime per MD Vancomycin 1750 mg once then 1000 mg q24hr (eAUC 466.6) unless change in renal function --SCr is elevated so may require adjustment tomorrow Trend WBC, Fever, Renal function F/u cultures, clinical course, WBC, fever De-escalate when able Levels at steady state F/u MRSA PCR  Height: (S) '5\' 10"'$  (177.8 cm) Weight: (S) 86.2 kg (190 lb) IBW/kg (Calculated) : 73  Temp (24hrs), Avg:98.5 F (36.9 C), Min:98.1 F (36.7 C), Max:98.8 F (37.1 C)  Recent Labs  Lab 01/22/22 0600 01/23/22 0224 01/24/22 0242 01/25/22 0604 01/27/22 1706  WBC 8.3 8.1 9.5 10.7* 6.6  CREATININE 1.21 1.38* 1.40* 1.36* 1.62*    Estimated Creatinine Clearance: 36.3 mL/min (A) (by C-G formula based on SCr of 1.62 mg/dL (H)).    Allergies  Allergen Reactions   Lipitor [Atorvastatin] Other (See Comments)    Hip pain   Antimicrobials this admission: cefepime 9/8 >>  vancomycin 9/8 >>   Microbiology results: Pending  Thank you for allowing pharmacy to be a part of this patient's care.  Lorelei Pont, PharmD, BCPS 01/27/2022 9:08 PM ED Clinical Pharmacist -  [phone]

## 2022-01-27 NOTE — ED Triage Notes (Addendum)
TO ED via [facility] from home-- had a stent placed on Tuesday 9/5 -- has been lethargic and not himself per family since he was discharged--  Does use home O2  at 2L all the time. Pt is alert/oriented x 4,  Short of breath= resp 30s-40s--  States "I just don't have any energy"  IV 18g Left wrist

## 2022-01-27 NOTE — H&P (Addendum)
History and Physical  [name] TKP:[id number] DOB: [birth year] DOA: 01/27/2022  Referring physician: Dr. Pearline Cables, [place]. PCP: [name], MD  Outpatient Specialists: Cardiology. Patient coming from: Home.  Chief Complaint: Shortness of breath.  HPI: Jeremiah [name] is a 81 y.o. male with medical history significant for NPH post VP shunt, type 2 diabetes, chronic hypoxic respiratory failure on 2 L nasal cannula, hypertension, hyperlipidemia, recently admitted for NSTEMI, coronary artery disease status post PCI with stenting on 01/24/2022 on DAPT, chronic diastolic CHF, who presented to [facility] ED from home with complaints of shortness of breath and generalized weakness.  Associated with lethargy and a nonproductive cough.  No subjective fevers or chills.  No chest pain.  EMS was activated.  Noted to be tachypneic with respiration rate in the 30s to 40s.  In the ED, O2 saturation in the 80s on home oxygen 2 L nasal cannula.  O2 supplementation was increased to 4 L nasal cannula to maintain O2 saturation greater than 90%.  CTA negative for pulmonary embolism but showed bilateral lower lobe infiltrates.  The patient was started on empiric IV antibiotics for HCAP in the ED, cefepime and IV vancomycin.  MRSA screening test is pending.  TRH, hospitalist service, was asked to admit.  ED Course: Tmax 98.8.  BP 120/79, pulse 72, respiratory rate 27, O2 saturation 93% on 4 L.  CBC essentially unremarkable except for MCV of 105 with hemoglobin of 13.9.  BUN 43, creatinine 1.62, GFR 42.  Troponin 133 from 125.  UA negative for pyuria.  Review of Systems: Review of systems as noted in the HPI. All other systems reviewed and are negative.   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   S/P radiation therapy 04/23/13 - 05/29/14   Prostate/seminal vesicles, external beam 4500 cGy in 25 sessions   Seasonal allergies    Skin cancer    scalp   Small bowel obstruction ([place]) 06/10/2016   Wears glasses    Past Surgical History:  Procedure Laterality Date   BACK SURGERY  2009   COLONOSCOPY W/ BIOPSIES AND POLYPECTOMY     CORONARY STENT INTERVENTION N/A 01/24/2022   Procedure: CORONARY STENT INTERVENTION;  Surgeon: [name], [name], MD;  Location: [place] [facility];  Service: Cardiovascular;  Laterality: N/A;   CYSTOSCOPY W/ URETERAL STENT PLACEMENT Left 05/12/2021   Procedure: CYSTOSCOPY WITH RETROGRADE PYELOGRAM/ LEFT URETERAL STENT PLACEMENT.;  Surgeon: [name], MD;  Location: [place];  Service: Urology;  Laterality: Left;   [place]   LEFT HEART CATH AND CORONARY ANGIOGRAPHY N/A 03/14/2019   Procedure: LEFT HEART CATH AND CORONARY ANGIOGRAPHY;  Surgeon: [name], MD;  Location: [place] [facility];  Service: Cardiovascular;  Laterality: N/A;   LEFT HEART CATH AND CORONARY ANGIOGRAPHY N/A 01/24/2022   Procedure: LEFT HEART CATH AND CORONARY ANGIOGRAPHY;  Surgeon: [name], [name], MD;  Location: [place] [facility];  Service: Cardiovascular;  Laterality: N/A;   PROSTATE BIOPSY  2013, 2014, 01/2014   Gleason 7   RADIOACTIVE SEED IMPLANT N/A 07/01/2014  Procedure: RADIOACTIVE SEED IMPLANT;  Surgeon: [name], MD;  Location: [facility];  Service: Urology;  Laterality: N/A;  DR PORTABLE   TONSILLECTOMY     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   Procedure: SHUNT INSERTION VENTRICULAR-PERITONEAL;  Surgeon: [name], MD;  Location: [place];  Service: Neurosurgery;  Laterality: N/A;  SHUNT INSERTION VENTRICULAR-PERITONEAL    Social History:  reports that he quit smoking about 60 years ago. His smoking use included cigarettes. He has  never used smokeless tobacco. He reports current alcohol use of about 1.0 standard drink of alcohol per week. He reports that he does not use drugs.   Allergies  Allergen Reactions   Lipitor [Atorvastatin] Other (See Comments)    Hip pain    Family History  Problem Relation Age of Onset   Heart attack Father 34   Cancer Father        prostate   Diabetes Father    Cancer Paternal Uncle        prostate   Dementia Mother 78   Lung cancer Brother       Prior to Admission medications   Medication Sig Start Date End Date Taking? Authorizing Provider  acetaminophen (TYLENOL) 500 MG tablet Take 1 tablet (500 mg total) by mouth every 6 (six) hours as needed for mild pain or moderate pain. 05/13/21  Yes Thurnell Lose, MD  aspirin EC 81 MG tablet Take 81 mg by mouth daily.    Yes [provider]  carvedilol (COREG) 12.5 MG tablet Take 1 tablet (12.5 mg total) by mouth 2 (two) times daily. 01/25/22  Yes Mercy Riding, MD  cetirizine (ZYRTEC) 10 MG tablet Take 10 mg by mouth daily.   Yes [provider]  cholecalciferol (VITAMIN D3) 25 MCG (1000 UNIT) tablet Take 1,000 Units by mouth daily.   Yes [provider]  clopidogrel (PLAVIX) 75 MG tablet Take 1 tablet (75 mg total) by mouth daily with breakfast. 01/26/22  Yes Mercy Riding, MD  dapagliflozin propanediol (FARXIGA) 10 MG TABS tablet Take 1 tablet (10 mg total) by mouth daily before breakfast. 01/25/22  Yes Gonfa, Taye T, MD  ezetimibe (ZETIA) 10 MG tablet Take 10 mg by mouth daily. 05/02/21  Yes [provider]  meclizine (ANTIVERT) 25 MG tablet Take 1 tablet (25 mg total) by mouth 3 (three) times daily as needed for dizziness. 08/13/21  Yes Mesner, Corene Cornea, MD  metFORMIN (GLUCOPHAGE-XR) 500 MG 24 hr tablet Take 500 mg by mouth daily. 01/11/22  Yes [provider]  pantoprazole (PROTONIX) 20 MG tablet Take 20 mg by mouth daily before breakfast.  02/19/19  Yes [provider]  rosuvastatin  (CRESTOR) 10 MG tablet Take 1 tablet (10 mg total) by mouth daily. 01/25/22 01/25/23 Yes Mercy Riding, MD  vitamin B-12 (CYANOCOBALAMIN) 500 MCG tablet Take 500 mcg by mouth daily.   Yes [provider]  Melatonin 10 MG TABS Take 10 mg by mouth at bedtime as needed (sleep).    [provider]  tamsulosin (FLOMAX) 0.4 MG CAPS capsule Take 1 capsule (0.4 mg total) by mouth daily after supper. Patient not taking: Reported on 01/21/2022 05/11/21   Deno Etienne, DO    Physical Exam: BP (!) 162/93   Pulse 79   Temp 98.1 F (36.7 C) (Oral)   Resp (!) 22   Ht (S) '5\' 10"'$  (1.778 m)   Wt (S) 86.2 kg   SpO2 95%   BMI 27.26  kg/m   General: 81 y.o. year-old male well developed well nourished in no acute distress.  Alert and oriented x3. Cardiovascular: Regular rate and rhythm with no rubs or gallops.  No thyromegaly or JVD noted.  No lower extremity edema. 2/4 pulses in all 4 extremities. Respiratory: Rales noted at bases with no wheezing noted.  Normal poor inspiratory effort. Abdomen: Soft nontender nondistended with normal bowel sounds x4 quadrants. Muskuloskeletal: No cyanosis, clubbing or edema noted bilaterally Neuro: CN II-XII intact, strength, sensation, reflexes Skin: No ulcerative lesions noted or rashes Psychiatry: Judgement and insight appear normal. Mood is appropriate for condition and setting          Labs on Admission:  Basic Metabolic Panel: Recent Labs  Lab 01/22/22 0600 01/23/22 0224 01/24/22 0242 01/25/22 0604 01/27/22 1706 01/27/22 2000  NA 140 143 142 144 145 143  K 4.0 3.8 4.0 4.4 4.2 4.1  CL 103 105 104 109 107  --   CO2 28 32 '30 27 27  '$ --   GLUCOSE 159* 142* 163* 155* 141*  --   BUN 19 27* 27* 25* 43*  --   CREATININE 1.21 1.38* 1.40* 1.36* 1.62*  --   CALCIUM 8.8* 8.7* 8.4* 8.6* 8.3*  --   MG 1.6* 2.2 2.0  --   --   --   PHOS 3.3 4.3 4.1  --   --   --    Liver Function Tests: Recent Labs  Lab 01/21/22 1202 01/22/22 0600 01/23/22 0224  01/24/22 0242  AST 20  --   --   --   ALT 11  --   --   --   ALKPHOS 55  --   --   --   BILITOT 0.7  --   --   --   PROT 6.1*  --   --   --   ALBUMIN 3.5 3.3* 3.1* 2.9*   Recent Labs  Lab 01/21/22 1202  LIPASE 35   No results for input(s): "AMMONIA" in the last 168 hours. CBC: Recent Labs  Lab 01/22/22 0600 01/23/22 0224 01/24/22 0242 01/25/22 0604 01/27/22 1706 01/27/22 2000  WBC 8.3 8.1 9.5 10.7* 6.6  --   NEUTROABS  --   --   --   --  4.7  --   HGB 15.1 14.6 14.0 15.0 13.7 13.9  HCT 46.5 46.7 44.3 49.6 45.7 41.0  MCV 97.5 98.1 100.7* 103.5* 105.5*  --   PLT 143* 143* 134* 141* 150  --    Cardiac Enzymes: No results for input(s): "CKTOTAL", "CKMB", "CKMBINDEX", "TROPONINI" in the last 168 hours.  BNP (last 3 results) Recent Labs    05/11/21 1008 05/13/21 0105 08/12/21 2110  BNP 41.1 43.1 40.1    ProBNP (last 3 results) No results for input(s): "PROBNP" in the last 8760 hours.  CBG: Recent Labs  Lab 01/24/22 1456 01/24/22 1617 01/24/22 2124 01/25/22 0631 01/25/22 1110  GLUCAP 118* 106* 162* 147* 139*    Radiological Exams on Admission: CT Angio Chest PE W and/or Wo Contrast  Result Date: 01/27/2022 CLINICAL DATA:  Shortness of breath; positive D-dimer, PE suspected EXAM: CT ANGIOGRAPHY CHEST WITH CONTRAST TECHNIQUE: Multidetector CT imaging of the chest was performed using the standard protocol during bolus administration of intravenous contrast. Multiplanar CT image reconstructions and MIPs were obtained to evaluate the vascular anatomy. RADIATION DOSE REDUCTION: This exam was performed according to the departmental dose-optimization program which includes automated exposure control, adjustment of the mA and/or kV  according to patient size and/or use of iterative reconstruction technique. CONTRAST:  26m OMNIPAQUE IOHEXOL 350 MG/ML SOLN COMPARISON:  CTA chest 07/05/2019 FINDINGS: Cardiovascular: Satisfactory opacification of the pulmonary arteries to the  segmental level. No evidence of pulmonary embolism. Normal heart size. No pericardial effusion. Aortic and coronary artery atherosclerotic calcification. Mediastinum/Nodes: No enlarged mediastinal, hilar, or axillary lymph nodes. Thyroid gland, trachea, and esophagus demonstrate no significant findings. Lungs/Pleura: Chronic elevation of the right hemidiaphragm. Increased atelectasis and/or infiltrates in the right middle and lower lobes. New small right pleural effusion. Similar atelectasis or infiltrates in the left lower lobe. No left pleural effusion. Upper Abdomen: No acute abdominal finding. Elevated right hemidiaphragm as noted with interposition of bowel between the liver and hemidiaphragm. Musculoskeletal: No chest wall abnormality. No acute osseous findings. Review of the MIP images confirms the above findings. IMPRESSION: 1. No acute pulmonary embolism. 2. Increased atelectasis/infiltrate in both lower lobes greater on the right with new small right pleural effusion. Some of this is chronic however superimposed pneumonia is difficult to exclude. 3. Coronary artery and aortic atherosclerosis. Electronically Signed   By: TPlacido SouM.D.   On: 01/27/2022 19:57   DG Abd 1 View  Result Date: 01/27/2022 CLINICAL DATA:  Evaluate for shunt malfunction EXAM: ABDOMEN - 1 VIEW COMPARISON:  Abdominal x-ray 05/29/2021 FINDINGS: Shunt catheter tip is in the right upper quadrant. This has slightly shifted positions when compared to the prior study. Bowel-gas pattern is nonobstructive. The visualized portion of the catheter appears intact. Multiple lines overlie the upper abdomen. Lower lumbar fusion hardware is present. Calcifications overlie the right kidney, unchanged. Prostate radiotherapy seeds are present. IMPRESSION: 1. Shunt catheter tip is in the right upper quadrant and appears intact. 2. Nonobstructive bowel gas pattern. Electronically Signed   By: ARonney AstersM.D.   On: 01/27/2022 18:46   DG Skull  1-3 Views  Result Date: 01/27/2022 CLINICAL DATA:  Evaluate for shunt malfunction. EXAM: SKULL - 2 VIEW COMPARISON:  CT head without contrast 08/12/2021 FINDINGS: A programmable shunt is in place. Tracheostomy catheter is stable. Tubing is contiguous from the programmable component into the upper chest. IMPRESSION: Programmable shunt with tubing contiguous from the programmable component into the upper chest. No acute or focal lesion. No evidence for complication. No other significant interval change. Electronically Signed   By: CSan MorelleM.D.   On: 01/27/2022 18:45   DG Cervical Spine 1 View  Result Date: 01/27/2022 CLINICAL DATA:  Evaluate shunt malfunction EXAM: DG CERVICAL SPINE - 1 VIEW COMPARISON:  None. FINDINGS: Shunt catheter overlies the mid right skull and right neck. The visualized portion of the catheter appears intact. Degenerative changes affect the cervical spine. There are atherosclerotic calcifications in the left neck. IMPRESSION: Visualized portion of the right-sided shunt catheter appears intact. Electronically Signed   By: ARonney AstersM.D.   On: 01/27/2022 18:44   DG Chest Portable 1 View  Result Date: 01/27/2022 CLINICAL DATA:  Hypoxia. EXAM: PORTABLE CHEST 1 VIEW COMPARISON:  Chest x-rays dated 01/20/2022 and 08/13/2021. FINDINGS: Heart size and mediastinal contours are stable. Chronic bibasilar opacities, likely chronic atelectasis and/or scarring. Increased opacity at the LEFT costophrenic angle. Upper lungs are relatively clear. No pneumothorax is seen. IMPRESSION: 1. Increased opacity at the LEFT costophrenic angle, pneumonia versus atelectasis and/or small pleural effusion. 2. Additional chronic bibasilar opacities, compatible with chronic atelectasis and/or scarring. Electronically Signed   By: SFranki CabotM.D.   On: 01/27/2022 17:29    EKG: I  independently viewed the EKG done and my findings are as followed: Sinus rhythm rate of 75.  Nonspecific ST-T changes.   QTc 434.  Assessment/Plan Present on Admission:  HCAP (healthcare-associated pneumonia)  Principal Problem:   HCAP (healthcare-associated pneumonia)  HCAP Recent discharge from the hospital on 01/25/2022 was admitted for NSTEMI, had PCI with stent placement. Personal reviewed of CT scan, bilateral pleural effusions right greater than left, could not rule out infiltrates in lower lobes bilaterally. Continue cefepime and IV vancomycin Holding off IV fluid for now due to bilateral pleural effusions and worsening chronic hypoxia. Follow MRSA screening test if negative DC IV vancomycin. Obtain baseline procalcitonin level in the morning. Obtain sputum culture. Monitor fever curve and WBC Incentive spirometer Mobilize as tolerated  Acute on chronic hypoxic respiratory failure secondary to above On 2 L nasal cannula oxygen supplementation at baseline. Currently requiring 4 L to maintain O2 saturation greater than 90% Continue to treat underlying condition. Mobilize as tolerated Incentive spirometer, flutter valve. Insulin sliding scale.  Generalized weakness PT OT to assess Fall precautions.  Recent NSTEMI, status post PCI with stent placement Coronary artery disease  S/p LHC and DES stent to RPDA on 9/5. Resume home DAPT Resume home Zetia and Crestor Denies any anginal symptoms. Consider cardiology involvement to assist with the management.  Chronic diastolic CHF Euvolemic on exam Continue home cardiac medications Strict I's and O's and daily weight  Type 2 diabetes with hyperglycemia A1c 7.9 on 01/21/2022. Hold off home oral hypoglycemics Start insulin sliding scale.  Hypertension, BP not at goal, elevated Resume home regimen Monitor vital signs  GERD Resume home PPI   DVT prophylaxis: Subcu Lovenox daily  Code Status: Full code  Family Communication: Updated his son at bedside  Disposition Plan: Admitted to telemetry cardiac unit  Consults called:  None.  Admission status: Inpatient status.   Status is: Inpatient The patient requires at least 2 midnights for further evaluation and treatment of present condition.   Kayleen Memos MD [facility] Pager [phone]  If 7PM-7AM, please contact night-coverage [email or web address] Password [facility]  01/27/2022, 9:35 PM

## 2022-01-27 NOTE — ED Provider Notes (Signed)
[facility] EMERGENCY DEPARTMENT Provider Note   CSN: [id number] Arrival date & time: 01/27/22  1624     History  Chief Complaint  Patient presents with   Shortness of Breath    Jeremiah [name] is a 81 y.o. male.  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 Pt sating at 89% on room air when EMS arrived. Chart review demonstrates recent admission on 01/20/22-9/6-23 for chest pain with dx of NSTEMI, trops in the 800s, and left heart cath on 9/5 demonstrating ld RPDA lesion with 90% stenosis. Pt treated with DES with 0% resideual stenosis. Pt was reported to have desaturations with ambulation to 82% and was sent home on 2 L [place].  Family states pt has been more confused since discharge. Denies fevers, chills, or coughing. Denies chest pain. Admits to worsening of baseline sob and worsening hypoxia this morning. No lweg swelling.     The history is provided by the patient. No language interpreter was used.  Shortness of Breath      Home Medications Prior to Admission medications   Medication Sig Start Date End Date Taking? Authorizing Provider  acetaminophen (TYLENOL) 500 MG tablet Take 1 tablet (500 mg total) by mouth every 6 (six) hours as needed for mild pain or moderate pain. 05/13/21   Thurnell Lose, MD  aspirin EC 81 MG tablet Take 81 mg by mouth daily.     [provider]  carvedilol (COREG) 12.5 MG tablet Take 1 tablet (12.5 mg total) by mouth 2 (two) times daily. 01/25/22   Mercy Riding, MD  cetirizine (ZYRTEC) 10 MG tablet Take 10 mg by mouth daily.    [provider]  cholecalciferol (VITAMIN D3) 25 MCG (1000 UNIT) tablet Take 1,000 Units by mouth daily.    [provider]  clopidogrel (PLAVIX) 75 MG tablet Take 1 tablet (75 mg total) by mouth daily with breakfast. 01/26/22   Mercy Riding, MD  dapagliflozin propanediol  (FARXIGA) 10 MG TABS tablet Take 1 tablet (10 mg total) by mouth daily before breakfast. 01/25/22   Mercy Riding, MD  ezetimibe (ZETIA) 10 MG tablet Take 10 mg by mouth daily. 05/02/21   [provider]  meclizine (ANTIVERT) 25 MG tablet Take 1 tablet (25 mg total) by mouth 3 (three) times daily as needed for dizziness. 08/13/21   Mesner, Corene Cornea, MD  Melatonin 10 MG TABS Take 10 mg by mouth at bedtime as needed (sleep).    [provider]  metFORMIN (GLUCOPHAGE-XR) 500 MG 24 hr tablet Take 500 mg by mouth daily. 01/11/22   [provider]  pantoprazole (PROTONIX) 20 MG tablet Take 20 mg by mouth daily before breakfast.  02/19/19   [provider]  rosuvastatin (CRESTOR) 10 MG tablet Take 1 tablet (10 mg total) by mouth daily. 01/25/22 01/25/23  Mercy Riding, MD  tamsulosin (FLOMAX) 0.4 MG CAPS capsule Take 1 capsule (0.4 mg total) by mouth daily after supper. Patient not taking: Reported on 01/21/2022 05/11/21   Deno Etienne, DO  vitamin B-12 (CYANOCOBALAMIN) 500 MCG tablet Take 500 mcg by mouth daily.    [provider]      Allergies    Lipitor [atorvastatin]    Review of Systems   Review of Systems  Respiratory:  Positive for shortness of breath.     Physical Exam Updated Vital  Signs BP (!) 162/93   Pulse 79   Temp 98.1 F (36.7 C) (Oral)   Resp (!) 22   Ht (S) '5\' 10"'$  (1.778 m)   Wt (S) 86.2 kg   SpO2 95%   BMI 27.26 kg/m  Physical Exam Vitals and nursing note reviewed.  Constitutional:      General: He is not in acute distress.    Appearance: He is well-developed.  HENT:     Head: Normocephalic and atraumatic.  Eyes:     Conjunctiva/sclera: Conjunctivae normal.  Cardiovascular:     Rate and Rhythm: Normal rate and regular rhythm.     Heart sounds: No murmur heard. Pulmonary:     Effort: Pulmonary effort is normal. No respiratory distress.     Breath sounds: Normal breath sounds.  Abdominal:     Palpations: Abdomen is soft.      Tenderness: There is no abdominal tenderness.  Musculoskeletal:        General: No swelling.     Cervical back: Neck supple.  Skin:    General: Skin is warm and dry.     Capillary Refill: Capillary refill takes less than 2 seconds.  Neurological:     Mental Status: He is oriented to person, place, and time. He is lethargic and confused.     GCS: GCS eye subscore is 4. GCS verbal subscore is 5. GCS motor subscore is 6.     Cranial Nerves: Cranial nerves 2-12 are intact.     Sensory: Sensation is intact.     Motor: Motor function is intact.  Psychiatric:        Mood and Affect: Mood normal.     ED Results / Procedures / Treatments   Labs (all labs ordered are listed, but only abnormal results are displayed) Labs Reviewed  CBC WITH DIFFERENTIAL/PLATELET - Abnormal; Notable for the following components:      Result Value   MCV 105.5 (*)    All other components within normal limits  BASIC METABOLIC PANEL - Abnormal; Notable for the following components:   Glucose, Bld 141 (*)    BUN 43 (*)    Creatinine, Ser 1.62 (*)    Calcium 8.3 (*)    GFR, Estimated 42 (*)    All other components within normal limits  D-DIMER, QUANTITATIVE - Abnormal; Notable for the following components:   D-Dimer, Quant 0.87 (*)    All other components within normal limits  URINALYSIS, ROUTINE W REFLEX MICROSCOPIC - Abnormal; Notable for the following components:   Glucose, UA >=500 (*)    All other components within normal limits  I-STAT VENOUS BLOOD GAS, ED - Abnormal; Notable for the following components:   pO2, Ven 85 (*)    Bicarbonate 28.8 (*)    Calcium, Ion 1.04 (*)    All other components within normal limits  TROPONIN I (HIGH SENSITIVITY) - Abnormal; Notable for the following components:   Troponin I (High Sensitivity) 135 (*)    All other components within normal limits  TROPONIN I (HIGH SENSITIVITY) - Abnormal; Notable for the following components:   Troponin I (High Sensitivity) 133 (*)     All other components within normal limits  RESP PANEL BY RT-PCR (FLU A&B, COVID) ARPGX2  MRSA NEXT GEN BY PCR, NASAL  CBC  CREATININE, SERUM    EKG EKG Interpretation  Date/Time:  Friday January 27 2022 16:25:44 EDT Ventricular Rate:  75 PR Interval:  174 QRS Duration: 142 QT Interval:  388 QTC  Calculation: 434 R Axis:   37 Text Interpretation: Sinus rhythm Ventricular premature complex Right bundle branch block Confirmed by Campbell Stall ([id number]) on 05/28/6158 5:06:32 PM  Radiology CT Angio Chest PE W and/or Wo Contrast  Result Date: 01/27/2022 CLINICAL DATA:  Shortness of breath; positive D-dimer, PE suspected EXAM: CT ANGIOGRAPHY CHEST WITH CONTRAST TECHNIQUE: Multidetector CT imaging of the chest was performed using the standard protocol during bolus administration of intravenous contrast. Multiplanar CT image reconstructions and MIPs were obtained to evaluate the vascular anatomy. RADIATION DOSE REDUCTION: This exam was performed according to the departmental dose-optimization program which includes automated exposure control, adjustment of the mA and/or kV according to patient size and/or use of iterative reconstruction technique. CONTRAST:  76m OMNIPAQUE IOHEXOL 350 MG/ML SOLN COMPARISON:  CTA chest 07/05/2019 FINDINGS: Cardiovascular: Satisfactory opacification of the pulmonary arteries to the segmental level. No evidence of pulmonary embolism. Normal heart size. No pericardial effusion. Aortic and coronary artery atherosclerotic calcification. Mediastinum/Nodes: No enlarged mediastinal, hilar, or axillary lymph nodes. Thyroid gland, trachea, and esophagus demonstrate no significant findings. Lungs/Pleura: Chronic elevation of the right hemidiaphragm. Increased atelectasis and/or infiltrates in the right middle and lower lobes. New small right pleural effusion. Similar atelectasis or infiltrates in the left lower lobe. No left pleural effusion. Upper Abdomen: No acute abdominal finding.  Elevated right hemidiaphragm as noted with interposition of bowel between the liver and hemidiaphragm. Musculoskeletal: No chest wall abnormality. No acute osseous findings. Review of the MIP images confirms the above findings. IMPRESSION: 1. No acute pulmonary embolism. 2. Increased atelectasis/infiltrate in both lower lobes greater on the right with new small right pleural effusion. Some of this is chronic however superimposed pneumonia is difficult to exclude. 3. Coronary artery and aortic atherosclerosis. Electronically Signed   By: TPlacido SouM.D.   On: 01/27/2022 19:57   DG Abd 1 View  Result Date: 01/27/2022 CLINICAL DATA:  Evaluate for shunt malfunction EXAM: ABDOMEN - 1 VIEW COMPARISON:  Abdominal x-ray 05/29/2021 FINDINGS: Shunt catheter tip is in the right upper quadrant. This has slightly shifted positions when compared to the prior study. Bowel-gas pattern is nonobstructive. The visualized portion of the catheter appears intact. Multiple lines overlie the upper abdomen. Lower lumbar fusion hardware is present. Calcifications overlie the right kidney, unchanged. Prostate radiotherapy seeds are present. IMPRESSION: 1. Shunt catheter tip is in the right upper quadrant and appears intact. 2. Nonobstructive bowel gas pattern. Electronically Signed   By: ARonney AstersM.D.   On: 01/27/2022 18:46   DG Skull 1-3 Views  Result Date: 01/27/2022 CLINICAL DATA:  Evaluate for shunt malfunction. EXAM: SKULL - 2 VIEW COMPARISON:  CT head without contrast 08/12/2021 FINDINGS: A programmable shunt is in place. Tracheostomy catheter is stable. Tubing is contiguous from the programmable component into the upper chest. IMPRESSION: Programmable shunt with tubing contiguous from the programmable component into the upper chest. No acute or focal lesion. No evidence for complication. No other significant interval change. Electronically Signed   By: CSan MorelleM.D.   On: 01/27/2022 18:45   DG Cervical  Spine 1 View  Result Date: 01/27/2022 CLINICAL DATA:  Evaluate shunt malfunction EXAM: DG CERVICAL SPINE - 1 VIEW COMPARISON:  None. FINDINGS: Shunt catheter overlies the mid right skull and right neck. The visualized portion of the catheter appears intact. Degenerative changes affect the cervical spine. There are atherosclerotic calcifications in the left neck. IMPRESSION: Visualized portion of the right-sided shunt catheter appears intact. Electronically Signed   By: AWarren Lacy  Dagoberto Reef M.D.   On: 01/27/2022 18:44   DG Chest Portable 1 View  Result Date: 01/27/2022 CLINICAL DATA:  Hypoxia. EXAM: PORTABLE CHEST 1 VIEW COMPARISON:  Chest x-rays dated 01/20/2022 and 08/13/2021. FINDINGS: Heart size and mediastinal contours are stable. Chronic bibasilar opacities, likely chronic atelectasis and/or scarring. Increased opacity at the LEFT costophrenic angle. Upper lungs are relatively clear. No pneumothorax is seen. IMPRESSION: 1. Increased opacity at the LEFT costophrenic angle, pneumonia versus atelectasis and/or small pleural effusion. 2. Additional chronic bibasilar opacities, compatible with chronic atelectasis and/or scarring. Electronically Signed   By: Franki Cabot M.D.   On: 01/27/2022 17:29    Procedures .Critical Care  Performed by: Lianne Cure, DO Authorized by: Lianne Cure, DO   Critical care provider statement:    Critical care time (minutes):  76   Critical care was necessary to treat or prevent imminent or life-threatening deterioration of the following conditions:  Respiratory failure   Critical care was time spent personally by me on the following activities:  Development of treatment plan with patient or surrogate, discussions with consultants, evaluation of patient's response to treatment, examination of patient, ordering and review of laboratory studies, ordering and review of radiographic studies, ordering and performing treatments and interventions, pulse oximetry, re-evaluation of  patient's condition and review of old charts   Care discussed with: admitting provider       Medications Ordered in ED Medications  ceFEPIme (MAXIPIME) 2 g in sodium chloride 0.9 % 100 mL IVPB (2 g Intravenous New Bag/Given 01/27/22 2106)  vancomycin (VANCOREADY) IVPB 1750 mg/350 mL (has no administration in time range)  enoxaparin (LOVENOX) injection 40 mg (has no administration in time range)  aspirin EC tablet 81 mg (has no administration in time range)  carvedilol (COREG) tablet 12.5 mg (has no administration in time range)  clopidogrel (PLAVIX) tablet 75 mg (has no administration in time range)  ezetimibe (ZETIA) tablet 10 mg (has no administration in time range)  rosuvastatin (CRESTOR) tablet 10 mg (has no administration in time range)  vitamin B-12 (CYANOCOBALAMIN) tablet 500 mcg (has no administration in time range)  iohexol (OMNIPAQUE) 350 MG/ML injection 100 mL (80 mLs Intravenous Contrast Given 01/27/22 1930)    ED Course/ Medical Decision Making/ A&P                           Medical Decision Making Amount and/or Complexity of Data Reviewed Labs: ordered. Radiology: ordered.  Risk Prescription drug management. Decision regarding hospitalization.  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  Patient is sleepy on exam but easily arousable.  GCS of 15 but confusion when recalling details of previous events.  Patient was reported to be hypoxic in the 80s on arrival.  He was recently discharged from the hospital after an NSTEMI and a 90% occlusion that received a stent.  He was told to be hypoxic with ambulation prior to discharge and was sent home on 2 L nasal cannula.  At this time patient is afebrile with no signs or symptoms of sepsis.  No leukocytosis.  CT chest demonstrates bilateral lower lobe pulmonary infiltrates.  Will cover for hospital-acquired  pneumonia.  Patient also has elevated troponins that have been down trending since admission for NSTEMI.   Creatinine mildly increased from baseline.  No urinary tract infection.  Patient  also has a VP shunt.  VP shunt series demonstrates no obstructions.  Patient is recommended for CPAP at home for OSA.  VBG ordered secondary to lethargy.  CO2 stable at 53.  Patient recommended for admission secondary to acute respiratory distress with hypoxia and confusion secondary to pneumonia.  Currently satting at 89 to 93% on 4 L nasal cannula.  Patient and family up-to-date and agreeable to plan. Pt accepted by Dr. Nevada Crane.        Final Clinical Impression(s) / ED Diagnoses Final diagnoses:  Acute respiratory failure with hypoxia ([place])  Pneumonia of both lower lobes due to infectious organism  Confusion  Elevated troponin    Rx / DC Orders ED Discharge Orders     None         Lianne Cure, DO 99/83/38 2128

## 2022-01-28 ENCOUNTER — Encounter (HOSPITAL_COMMUNITY): Payer: Self-pay | Admitting: Internal Medicine

## 2022-01-28 ENCOUNTER — Other Ambulatory Visit: Payer: Self-pay

## 2022-01-28 DIAGNOSIS — R410 Disorientation, unspecified: Secondary | ICD-10-CM | POA: Diagnosis not present

## 2022-01-28 DIAGNOSIS — J189 Pneumonia, unspecified organism: Secondary | ICD-10-CM | POA: Diagnosis not present

## 2022-01-28 DIAGNOSIS — R778 Other specified abnormalities of plasma proteins: Secondary | ICD-10-CM

## 2022-01-28 LAB — CBC WITH DIFFERENTIAL/PLATELET
Abs Immature Granulocytes: 0.11 10*3/uL — ABNORMAL HIGH (ref 0.00–0.07)
Basophils Absolute: 0 10*3/uL (ref 0.0–0.1)
Basophils Relative: 1 %
Eosinophils Absolute: 0.2 10*3/uL (ref 0.0–0.5)
Eosinophils Relative: 3 %
HCT: 42.4 % (ref 39.0–52.0)
Hemoglobin: 13 g/dL (ref 13.0–17.0)
Immature Granulocytes: 2 %
Lymphocytes Relative: 14 %
Lymphs Abs: 1 10*3/uL (ref 0.7–4.0)
MCH: 31.9 pg (ref 26.0–34.0)
MCHC: 30.7 g/dL (ref 30.0–36.0)
MCV: 103.9 fL — ABNORMAL HIGH (ref 80.0–100.0)
Monocytes Absolute: 0.7 10*3/uL (ref 0.1–1.0)
Monocytes Relative: 10 %
Neutro Abs: 5 10*3/uL (ref 1.7–7.7)
Neutrophils Relative %: 70 %
Platelets: 130 10*3/uL — ABNORMAL LOW (ref 150–400)
RBC: 4.08 MIL/uL — ABNORMAL LOW (ref 4.22–5.81)
RDW: 14.6 % (ref 11.5–15.5)
WBC: 7 10*3/uL (ref 4.0–10.5)
nRBC: 0 % (ref 0.0–0.2)

## 2022-01-28 LAB — GLUCOSE, CAPILLARY
Glucose-Capillary: 114 mg/dL — ABNORMAL HIGH (ref 70–99)
Glucose-Capillary: 161 mg/dL — ABNORMAL HIGH (ref 70–99)
Glucose-Capillary: 107 mg/dL — ABNORMAL HIGH (ref 70–99)
Glucose-Capillary: 140 mg/dL — ABNORMAL HIGH (ref 70–99)

## 2022-01-28 LAB — BASIC METABOLIC PANEL
Anion gap: 10 (ref 5–15)
BUN: 37 mg/dL — ABNORMAL HIGH (ref 8–23)
CO2: 26 mmol/L (ref 22–32)
Calcium: 8 mg/dL — ABNORMAL LOW (ref 8.9–10.3)
Chloride: 109 mmol/L (ref 98–111)
Creatinine, Ser: 1.52 mg/dL — ABNORMAL HIGH (ref 0.61–1.24)
GFR, Estimated: 45 mL/min — ABNORMAL LOW (ref >60)
Glucose, Bld: 129 mg/dL — ABNORMAL HIGH (ref 70–99)
Potassium: 4.1 mmol/L (ref 3.5–5.1)
Sodium: 145 mmol/L (ref 135–145)

## 2022-01-28 LAB — MAGNESIUM: Magnesium: 1.9 mg/dL (ref 1.7–2.4)

## 2022-01-28 LAB — PROCALCITONIN: Procalcitonin: <0.1 ng/mL

## 2022-01-28 LAB — PHOSPHORUS: Phosphorus: 4.2 mg/dL (ref 2.5–4.6)

## 2022-01-28 NOTE — Plan of Care (Signed)
The patient is admitted from ED to 3 E room 25. A O x 4. He denies any acute pain. Full assessment to epic completed. Will continue to monitor.

## 2022-01-28 NOTE — Hospital Course (Signed)
81 y.o. male with medical history significant for NPH post VP shunt, type 2 diabetes, chronic hypoxic respiratory failure on 2 L nasal cannula, hypertension, hyperlipidemia, recently admitted for NSTEMI, coronary artery disease status post PCI with stenting on 01/24/2022 on DAPT, chronic diastolic CHF, who presented to [facility] ED from home with complaints of shortness of breath and generalized weakness.  Associated with lethargy and a nonproductive cough.  No subjective fevers or chills.  No chest pain.  EMS was activated.  Noted to be tachypneic with respiration rate in the 30s to 40s.  In the ED, O2 saturation in the 80s on home oxygen 2 L nasal cannula.  O2 supplementation was increased to 4 L nasal cannula to maintain O2 saturation greater than 90%.  CTA negative for pulmonary embolism but showed bilateral lower lobe infiltrates.  The patient was started on empiric IV antibiotics for HCAP in the ED, cefepime and IV vancomycin.

## 2022-01-28 NOTE — Progress Notes (Signed)
SATURATION QUALIFICATIONS: (This note is used to comply with regulatory documentation for home oxygen)  Patient Saturations on Room Air at Rest = 90%  Patient Saturations on Room Air while Ambulating = 82%  Patient Saturations on 2 Liters of oxygen while Ambulating = 96%

## 2022-01-28 NOTE — Progress Notes (Signed)
Progress Note   Patient: Jeremiah [name] HYQ:[id number] DOB: 28-Mar-1941 DOA: 01/27/2022     1 DOS: the patient was seen and examined on 01/28/2022   Brief hospital course: 81 y.o. male with medical history significant for NPH post VP shunt, type 2 diabetes, chronic hypoxic respiratory failure on 2 L nasal cannula, hypertension, hyperlipidemia, recently admitted for NSTEMI, coronary artery disease status post PCI with stenting on 01/24/2022 on DAPT, chronic diastolic CHF, who presented to [facility] ED from home with complaints of shortness of breath and generalized weakness.  Associated with lethargy and a nonproductive cough.  No subjective fevers or chills.  No chest pain.  EMS was activated.  Noted to be tachypneic with respiration rate in the 30s to 40s.  In the ED, O2 saturation in the 80s on home oxygen 2 L nasal cannula.  O2 supplementation was increased to 4 L nasal cannula to maintain O2 saturation greater than 90%.  CTA negative for pulmonary embolism but showed bilateral lower lobe infiltrates.  The patient was started on empiric IV antibiotics for HCAP in the ED, cefepime and IV vancomycin.  Assessment and Plan: HCAP Recent discharge from the hospital on 01/25/2022 was admitted for NSTEMI, had PCI with stent placement. Personal reviewed of CT scan, bilateral pleural effusions right greater than left, could not rule out infiltrates in lower lobes bilaterally. Initially on cefepime and IV vancomycin Holding off IV fluid for now due to bilateral pleural effusions and worsening chronic hypoxia. MRSA neg, thus have d/c vanc Monitor fever curve and WBC Incentive spirometer Mobilize as tolerated   Acute on chronic hypoxic respiratory failure secondary to above On 2 L nasal cannula oxygen supplementation at baseline. Currently requiring 4 L to maintain O2 saturation greater than 90% Continue to treat underlying condition. Mobilize as tolerated   Generalized weakness PT OT consulted with no therapy  recs identified Fall precautions.   Recent NSTEMI, status post PCI with stent placement Coronary artery disease  S/p LHC and DES stent to RPDA on 9/5. Resume home DAPT Resume home Zetia and Crestor Denies any anginal symptoms.   Chronic diastolic CHF Euvolemic on exam Continue home cardiac medications Strict I's and O's and daily weight   Type 2 diabetes with hyperglycemia A1c 7.9 on 01/21/2022. Hold off home oral hypoglycemics Start insulin sliding scale.   Hypertension, BP not at goal, elevated Resume home regimen Monitor vital signs   GERD Resume home PPI      Subjective: Denies sob or chest pains.   Physical Exam: Vitals:   01/28/22 0028 01/28/22 0500 01/28/22 0853 01/28/22 0900  BP:  97/62 (!) 146/74   Pulse:  67 67 67  Resp:  20  20  Temp:  (!) 97.3 F (36.3 C)  97.9 F (36.6 C)  TempSrc:  Oral  Oral  SpO2:  91%    Weight: 89.5 kg 89.5 kg    Height: '5\' 10"'$  (1.778 m)      General exam: Awake, laying in bed, in nad Respiratory system: Normal respiratory effort, no wheezing Cardiovascular system: regular rate, s1, s2 Gastrointestinal system: Soft, nondistended, positive BS Central nervous system: CN2-12 grossly intact, strength intact Extremities: Perfused, no clubbing Skin: Normal skin turgor, no notable skin lesions seen Psychiatry: Mood normal // no visual hallucinations   Data Reviewed:  Labs reviewed: Na 145, K 4.1, 1.52   Family Communication: Pt in room, family not at bedside  Disposition: Status is: Inpatient Remains inpatient appropriate because: Severity of illness  Planned Discharge  Destination: Home    Author: Marylu Lund, MD 01/28/2022 2:37 PM  For on call review [email or web address].

## 2022-01-28 NOTE — Evaluation (Signed)
Physical Therapy Evaluation Patient Details Name: Jeremiah [name] MRN: [id number] DOB: Feb 04, 1941 Today's Date: 01/28/2022  History of Present Illness  81 y.o. male presents to [facility] hospital on 01/27/2022 with SOB and generalized weakness. Pt recently admitted with NSTEMI, underwent PCI to stenting on 01/24/2022, discharged 9/6. Pt now admitted for management of HCAP. PMH: BPH, DM, HTN, OSA on CPAP, AAA, prostate CA s/p raditation, NPH s/p VP shunt  Clinical Impression  Pt presents to PT with deficits in cardiopulmonary function, desaturating when mobilizing on room air. Pt recently discharged on 2L [place], however he reports not utilizing it or his CPAP since discharge. Pt will benefit from continued aggressive mobilization in an effort to improve pulmonary function and restore the pt's PLOF. PT anticipates no post-acute PT needs at the time of discharge.       Recommendations for follow up therapy are one component of a multi-disciplinary discharge planning process, led by the attending physician.  Recommendations may be updated based on patient status, additional functional criteria and insurance authorization.  Follow Up Recommendations No PT follow up      Assistance Recommended at Discharge PRN  Patient can return home with the following  Assistance with cooking/housework    Equipment Recommendations None recommended by PT  Recommendations for Other Services       Functional Status Assessment Patient has had a recent decline in their functional status and demonstrates the ability to make significant improvements in function in a reasonable and predictable amount of time.     Precautions / Restrictions Precautions Precautions: None Precaution Comments: monitor SpO2, desat on room air 9/9 Restrictions Weight Bearing Restrictions: No      Mobility  Bed Mobility                    Transfers Overall transfer level: Independent                       Ambulation/Gait Ambulation/Gait assistance: Supervision Gait Distance (Feet): 300 Feet Assistive device: None Gait Pattern/deviations: Step-through pattern Gait velocity: functional Gait velocity interpretation: >2.62 ft/sec, indicative of community ambulatory   General Gait Details: steady step-through gait, 2 standing breaks to allow for more accurate assessment of vitals, pt denies SOB however PT notes increased RR and mild wheezing near end of walk  Stairs            Wheelchair Mobility    Modified Rankin (Stroke Patients Only)       Balance Overall balance assessment: Needs assistance Sitting-balance support: Feet supported, No upper extremity supported Sitting balance-Leahy Scale: Normal     Standing balance support: No upper extremity supported, During functional activity Standing balance-Leahy Scale: Good                               Pertinent Vitals/Pain Pain Assessment Pain Assessment: No/denies pain    Home Living Family/patient expects to be discharged to:: Private residence Living Arrangements: Spouse/significant other Available Help at Discharge: Family;Available PRN/intermittently (spouse with dementia) Type of Home: House Home Access: Stairs to enter Entrance Stairs-Rails: Can reach both;Left;Right Entrance Stairs-Number of Steps: 2 Alternate Level Stairs-Number of Steps: stays on main level Home Layout: Multi-level;Able to live on main level with bedroom/bathroom Home Equipment: Grab bars - tub/shower;Hand held shower head;Cane - single point;Rolling Walker (2 wheels);Shower seat - built in Additional Comments: spouse has dementia, has CNA to help 2x/ week and children PRN  Prior Function Prior Level of Function : Independent/Modified Independent             Mobility Comments: independent ambulation without DME ADLs Comments: drives, cares for spouse (mostly cog); reports has assist with meds, meals and cleaning at  baseline     Hand Dominance   Dominant Hand: Right    Extremity/Trunk Assessment   Upper Extremity Assessment Upper Extremity Assessment: Overall WFL for tasks assessed    Lower Extremity Assessment Lower Extremity Assessment: Overall WFL for tasks assessed    Cervical / Trunk Assessment Cervical / Trunk Assessment: Normal  Communication   Communication: No difficulties  Cognition Arousal/Alertness: Awake/alert Behavior During Therapy: WFL for tasks assessed/performed Overall Cognitive Status: Within Functional Limits for tasks assessed                                          General Comments General comments (skin integrity, edema, etc.): pt on 4L [place] at rest, sats in high 90s. PT weans to room air with sats in low 90s pre-mobility. sats fluctuate with mobility from 83%-98% pleth reading often unreliable despite new SpO2 monitor placed prior to mobility. Upon return to seated position sats in high 80s at rest. PT returns pt to 2L [place] with improvement in sats to high 90s, RN made aware    Exercises     Assessment/Plan    PT Assessment Patient needs continued PT services  PT Problem List Decreased activity tolerance       PT Treatment Interventions Therapeutic exercise;Patient/family education    PT Goals (Current goals can be found in the Care Plan section)  Acute Rehab PT Goals Patient Stated Goal: to go home PT Goal Formulation: With patient Time For Goal Achievement: 02/11/22 Potential to Achieve Goals: Good Additional Goals Additional Goal #1: Pt will ambulate for >500'reporting 0/4 DOE while also on room air    Frequency Min 2X/week     Co-evaluation               AM-PAC PT "6 Clicks" Mobility  Outcome Measure Help needed turning from your back to your side while in a flat bed without using bedrails?: None Help needed moving from lying on your back to sitting on the side of a flat bed without using bedrails?: None Help needed  moving to and from a bed to a chair (including a wheelchair)?: None Help needed standing up from a chair using your arms (e.g., wheelchair or bedside chair)?: None Help needed to walk in hospital room?: A Little Help needed climbing 3-5 steps with a railing? : A Little 6 Click Score: 22    End of Session Equipment Utilized During Treatment: Oxygen Activity Tolerance: Patient tolerated treatment well Patient left: in chair;with call bell/phone within reach Nurse Communication: Mobility status PT Visit Diagnosis: Other abnormalities of gait and mobility (R26.89)    Time: 0947-0962 PT Time Calculation (min) (ACUTE ONLY): 13 min   Charges:   PT Evaluation $PT Eval Low Complexity: [place], PT, DPT Acute Rehabilitation Office [phone]   Zenaida Niece 01/28/2022, 12:41 PM

## 2022-01-28 NOTE — Progress Notes (Signed)
New cpap order for Pt. Pt states he is used to his own at home unit but is willing to try ours later this evening.

## 2022-01-28 NOTE — Evaluation (Signed)
Occupational Therapy Evaluation Patient Details Name: Jeremiah [name] MRN: [id number] DOB: August 24, 1940 Today's Date: 01/28/2022   History of Present Illness Pt is an 81 y/o male admitted 01/20/22 for chest pain. EKG showed R bundle branch block. Admitted for elevated troponin in setting of uncontrolled HTN. S/p LHC and coronary angiography 9/5. PMH includes BPH, DM, HTN, OSA on CPAP, AAA, prostate CA s/p raditation, NPH s/p VP shunt.   Clinical Impression   Patient admitted for the diagnosis above.  Recent hospital admission.  PTA he lives with his spouse, who has dementia, and typically walks without a AD, and is able to complete his own ADL, light home management and meal prep.  Patient is desaturating on RA, but is otherwise needing generalized supervision for ADL and in room mobility.  OT will follow along in the acute setting with no post acute OT anticipated.       Recommendations for follow up therapy are one component of a multi-disciplinary discharge planning process, led by the attending physician.  Recommendations may be updated based on patient status, additional functional criteria and insurance authorization.   Follow Up Recommendations  No OT follow up    Assistance Recommended at Discharge None  Patient can return home with the following Assist for transportation    Functional Status Assessment  Patient has had a recent decline in their functional status and demonstrates the ability to make significant improvements in function in a reasonable and predictable amount of time.  Equipment Recommendations  None recommended by OT    Recommendations for Other Services       Precautions / Restrictions Precautions Precautions: Fall;Other (comment) Precaution Comments: Watch SpO2 (does not wear baseline) Restrictions Weight Bearing Restrictions: No      Mobility Bed Mobility Overal bed mobility: Modified Independent                  Transfers Overall transfer  level: Needs assistance Equipment used: None Transfers: Sit to/from Stand Sit to Stand: Supervision                  Balance Overall balance assessment: Needs assistance Sitting-balance support: Feet supported Sitting balance-Leahy Scale: Good     Standing balance support: No upper extremity supported Standing balance-Leahy Scale: Good                             ADL either performed or assessed with clinical judgement   ADL       Grooming: Supervision/safety;Standing           Upper Body Dressing : Supervision/safety;Standing;Sitting   Lower Body Dressing: Supervision/safety;Sit to/from stand   Toilet Transfer: Supervision/safety;Ambulation;Regular Toilet                   Vision Patient Visual Report: No change from baseline       Perception Perception Perception: Within Functional Limits   Praxis Praxis Praxis: Intact    Pertinent Vitals/Pain Pain Assessment Pain Assessment: No/denies pain     Hand Dominance Right   Extremity/Trunk Assessment Upper Extremity Assessment Upper Extremity Assessment: Overall WFL for tasks assessed   Lower Extremity Assessment Lower Extremity Assessment: Defer to PT evaluation   Cervical / Trunk Assessment Cervical / Trunk Assessment: Normal   Communication Communication Communication: No difficulties   Cognition Arousal/Alertness: Awake/alert Behavior During Therapy: WFL for tasks assessed/performed Overall Cognitive Status: Within Functional Limits for tasks assessed  General Comments   Occasional HR to 110's and O2 stable on 4L    Exercises     Shoulder Instructions      Home Living Family/patient expects to be discharged to:: Private residence Living Arrangements: Spouse/significant other Available Help at Discharge: Family;Available PRN/intermittently Type of Home: House Home Access: Stairs to enter [employer]  of Steps: 2 Entrance Stairs-Rails: Can reach both;Left;Right Home Layout: Multi-level;Able to live on main level with bedroom/bathroom Alternate Level Stairs-Number of Steps: stays on main level   Bathroom Shower/Tub: Occupational psychologist: Standard Bathroom Accessibility: Yes How Accessible: Accessible via walker Home Equipment: Grab bars - tub/shower;Hand held shower head;Cane - single point;Rolling Walker (2 wheels);Shower seat - built in   Additional Comments: spouse has dementia, has CNA to help 2x/ week and children PRN      Prior Functioning/Environment Prior Level of Function : Independent/Modified Independent               ADLs Comments: drives, cares for spouse (mostly cog); reports has assist with meds, meals and cleaning at baseline        OT Problem List: Decreased activity tolerance;Impaired balance (sitting and/or standing)      OT Treatment/Interventions: Self-care/ADL training;Energy conservation;Therapeutic activities;Patient/family education    OT Goals(Current goals can be found in the care plan section) Acute Rehab OT Goals Patient Stated Goal: Return home OT Goal Formulation: With patient Time For Goal Achievement: 02/10/22 Potential to Achieve Goals: Good ADL Goals Pt Will Perform Grooming: Independently;standing;sitting Pt Will Perform Lower Body Dressing: Independently;sit to/from stand Pt Will Transfer to Toilet: Independently;ambulating;regular height toilet  OT Frequency: Min 2X/week    Co-evaluation              AM-PAC OT "6 Clicks" Daily Activity     Outcome Measure Help from another person eating meals?: None Help from another person taking care of personal grooming?: None Help from another person toileting, which includes using toliet, bedpan, or urinal?: A Little Help from another person bathing (including washing, rinsing, drying)?: A Little Help from another person to put on and taking off regular upper body  clothing?: None Help from another person to put on and taking off regular lower body clothing?: A Little 6 Click Score: 21   End of Session Equipment Utilized During Treatment: Oxygen Nurse Communication: Mobility status  Activity Tolerance: Patient tolerated treatment well Patient left: in chair;with call bell/phone within reach  OT Visit Diagnosis: Unsteadiness on feet (R26.81)                Time: 1130-1155 OT Time Calculation (min): 25 min Charges:  OT General Charges $OT Visit: 1 Visit OT Evaluation $OT Eval Moderate Complexity: 1 Mod OT Treatments $Self Care/Home Management : 8-22 mins  01/28/2022  RP, OTR/L  Acute Rehabilitation Services  Office:  [phone]   Metta Clines 01/28/2022, 12:04 PM

## 2022-01-29 DIAGNOSIS — J189 Pneumonia, unspecified organism: Secondary | ICD-10-CM | POA: Diagnosis not present

## 2022-01-29 DIAGNOSIS — J9601 Acute respiratory failure with hypoxia: Secondary | ICD-10-CM

## 2022-01-29 DIAGNOSIS — R778 Other specified abnormalities of plasma proteins: Secondary | ICD-10-CM | POA: Diagnosis not present

## 2022-01-29 LAB — COMPREHENSIVE METABOLIC PANEL
ALT: 14 U/L (ref 0–44)
AST: 14 U/L — ABNORMAL LOW (ref 15–41)
Albumin: 2.8 g/dL — ABNORMAL LOW (ref 3.5–5.0)
Alkaline Phosphatase: 46 U/L (ref 38–126)
Anion gap: 9 (ref 5–15)
BUN: 30 mg/dL — ABNORMAL HIGH (ref 8–23)
CO2: 28 mmol/L (ref 22–32)
Calcium: 8.4 mg/dL — ABNORMAL LOW (ref 8.9–10.3)
Chloride: 107 mmol/L (ref 98–111)
Creatinine, Ser: 1.47 mg/dL — ABNORMAL HIGH (ref 0.61–1.24)
GFR, Estimated: 47 mL/min — ABNORMAL LOW (ref >60)
Glucose, Bld: 176 mg/dL — ABNORMAL HIGH (ref 70–99)
Potassium: 4.1 mmol/L (ref 3.5–5.1)
Sodium: 144 mmol/L (ref 135–145)
Total Bilirubin: 0.5 mg/dL (ref 0.3–1.2)
Total Protein: 5 g/dL — ABNORMAL LOW (ref 6.5–8.1)

## 2022-01-29 LAB — GLUCOSE, CAPILLARY
Glucose-Capillary: 143 mg/dL — ABNORMAL HIGH (ref 70–99)
Glucose-Capillary: 141 mg/dL — ABNORMAL HIGH (ref 70–99)
Glucose-Capillary: 177 mg/dL — ABNORMAL HIGH (ref 70–99)
Glucose-Capillary: 151 mg/dL — ABNORMAL HIGH (ref 70–99)

## 2022-01-29 LAB — CBC
HCT: 40.9 % (ref 39.0–52.0)
Hemoglobin: 12.6 g/dL — ABNORMAL LOW (ref 13.0–17.0)
MCH: 31.6 pg (ref 26.0–34.0)
MCHC: 30.8 g/dL (ref 30.0–36.0)
MCV: 102.5 fL — ABNORMAL HIGH (ref 80.0–100.0)
Platelets: 133 10*3/uL — ABNORMAL LOW (ref 150–400)
RBC: 3.99 MIL/uL — ABNORMAL LOW (ref 4.22–5.81)
RDW: 14.4 % (ref 11.5–15.5)
WBC: 7.1 10*3/uL (ref 4.0–10.5)
nRBC: 0 % (ref 0.0–0.2)

## 2022-01-29 MED ORDER — ORAL CARE MOUTH RINSE: 15.0000 mL | OROMUCOSAL | Status: DC | PRN

## 2022-01-29 NOTE — Progress Notes (Signed)
Patient currently on CPAP, previous settings of auto 12-6 with 6l O2 bleed in.

## 2022-01-29 NOTE — Progress Notes (Signed)
Pt placed on cpap for the night and seems to be tolerating it well at this time. 4L O2 bled in sats 93% currently.

## 2022-01-29 NOTE — Progress Notes (Signed)
RN called RT and stated she took Pt off of cpap due to destating in the 80s. Pt was placed back on [place] 4L and sats currently 93%.

## 2022-01-29 NOTE — Progress Notes (Signed)
Progress Note   Patient: Jeremiah [name] TIR:[id number] DOB: 02-05-1941 DOA: 01/27/2022     2 DOS: the patient was seen and examined on 01/29/2022   Brief hospital course: 81 y.o. male with medical history significant for NPH post VP shunt, type 2 diabetes, chronic hypoxic respiratory failure on 2 L nasal cannula, hypertension, hyperlipidemia, recently admitted for NSTEMI, coronary artery disease status post PCI with stenting on 01/24/2022 on DAPT, chronic diastolic CHF, who presented to [facility] ED from home with complaints of shortness of breath and generalized weakness.  Associated with lethargy and a nonproductive cough.  No subjective fevers or chills.  No chest pain.  EMS was activated.  Noted to be tachypneic with respiration rate in the 30s to 40s.  In the ED, O2 saturation in the 80s on home oxygen 2 L nasal cannula.  O2 supplementation was increased to 4 L nasal cannula to maintain O2 saturation greater than 90%.  CTA negative for pulmonary embolism but showed bilateral lower lobe infiltrates.  The patient was started on empiric IV antibiotics for HCAP in the ED, cefepime and IV vancomycin.  Assessment and Plan: HCAP Recent discharge from the hospital on 01/25/2022 was admitted for NSTEMI, had PCI with stent placement. Personal reviewed of CT scan, bilateral pleural effusions right greater than left, could not rule out infiltrates in lower lobes bilaterally. Initially on cefepime and IV vancomycin. MRSA neg, thus vanc stopped Holding off IV fluid for now due to bilateral pleural effusions and worsening chronic hypoxia. Incentive spirometer Mobilize as tolerated   Acute on chronic hypoxic respiratory failure secondary to above On 2 L nasal cannula oxygen supplementation at baseline. O2 weaned to 2-3 L this afternoon Mobilize as tolerated   Generalized weakness PT OT consulted with no therapy recs identified Fall precautions.   Recent NSTEMI, status post PCI with stent placement Coronary  artery disease  S/p LHC and DES stent to RPDA on 9/5. Cont home DAPT Resume home Zetia and Crestor Denies any anginal symptoms.   Chronic diastolic CHF Euvolemic on exam Continue home cardiac medications Strict I's and O's and daily weight   Type 2 diabetes with hyperglycemia A1c 7.9 on 01/21/2022. Hold off home oral hypoglycemics Start insulin sliding scale.   Hypertension, BP not at goal, elevated Resume home regimen Monitor vital signs   GERD Resume home PPI      Subjective:Reports without sob or chest pains  Physical Exam: Vitals:   01/29/22 0513 01/29/22 0833 01/29/22 1153 01/29/22 1543  BP: (!) 141/78 (!) 133/59 (!) 141/69 126/68  Pulse: 77 77 71 74  Resp: 16 19 (!) 23 (!) 22  Temp: 98 F (36.7 C) 98.7 F (37.1 C) 97.7 F (36.5 C) 97.8 F (36.6 C)  TempSrc: Oral Oral Axillary Oral  SpO2: 98% 96% 97% 96%  Weight: 88.9 kg     Height:       General exam: Conversant, in no acute distress Respiratory system: normal chest rise, clear, no audible wheezing Cardiovascular system: regular rhythm, s1-s2 Gastrointestinal system: Nondistended, nontender, pos BS Central nervous system: No seizures, no tremors Extremities: No cyanosis, no joint deformities Skin: No rashes, no pallor Psychiatry: Affect normal // no auditory hallucinations   Data Reviewed:  Labs reviewed: Na 144, K 4.1, 1.47   Family Communication: Pt in room, family not at bedside  Disposition: Status is: Inpatient Remains inpatient appropriate because: Severity of illness  Planned Discharge Destination: Home    Author: Marylu Lund, MD 01/29/2022 5:20 PM  For  on call review [email or web address].

## 2022-01-30 DIAGNOSIS — J189 Pneumonia, unspecified organism: Secondary | ICD-10-CM | POA: Diagnosis not present

## 2022-01-30 DIAGNOSIS — J9601 Acute respiratory failure with hypoxia: Secondary | ICD-10-CM | POA: Diagnosis not present

## 2022-01-30 DIAGNOSIS — R778 Other specified abnormalities of plasma proteins: Secondary | ICD-10-CM | POA: Diagnosis not present

## 2022-01-30 LAB — COMPREHENSIVE METABOLIC PANEL
ALT: 12 U/L (ref 0–44)
AST: 14 U/L — ABNORMAL LOW (ref 15–41)
Albumin: 3.1 g/dL — ABNORMAL LOW (ref 3.5–5.0)
Alkaline Phosphatase: 48 U/L (ref 38–126)
Anion gap: 6 (ref 5–15)
BUN: 26 mg/dL — ABNORMAL HIGH (ref 8–23)
CO2: 32 mmol/L (ref 22–32)
Calcium: 8.8 mg/dL — ABNORMAL LOW (ref 8.9–10.3)
Chloride: 106 mmol/L (ref 98–111)
Creatinine, Ser: 1.48 mg/dL — ABNORMAL HIGH (ref 0.61–1.24)
GFR, Estimated: 47 mL/min — ABNORMAL LOW (ref >60)
Glucose, Bld: 139 mg/dL — ABNORMAL HIGH (ref 70–99)
Potassium: 3.9 mmol/L (ref 3.5–5.1)
Sodium: 144 mmol/L (ref 135–145)
Total Bilirubin: 0.5 mg/dL (ref 0.3–1.2)
Total Protein: 5.9 g/dL — ABNORMAL LOW (ref 6.5–8.1)

## 2022-01-30 LAB — CBC
HCT: 41.3 % (ref 39.0–52.0)
Hemoglobin: 12.8 g/dL — ABNORMAL LOW (ref 13.0–17.0)
MCH: 31.8 pg (ref 26.0–34.0)
MCHC: 31 g/dL (ref 30.0–36.0)
MCV: 102.5 fL — ABNORMAL HIGH (ref 80.0–100.0)
Platelets: 141 10*3/uL — ABNORMAL LOW (ref 150–400)
RBC: 4.03 MIL/uL — ABNORMAL LOW (ref 4.22–5.81)
RDW: 14.2 % (ref 11.5–15.5)
WBC: 6.7 10*3/uL (ref 4.0–10.5)
nRBC: 0 % (ref 0.0–0.2)

## 2022-01-30 LAB — GLUCOSE, CAPILLARY
Glucose-Capillary: 140 mg/dL — ABNORMAL HIGH (ref 70–99)
Glucose-Capillary: 195 mg/dL — ABNORMAL HIGH (ref 70–99)
Glucose-Capillary: 171 mg/dL — ABNORMAL HIGH (ref 70–99)
Glucose-Capillary: 147 mg/dL — ABNORMAL HIGH (ref 70–99)

## 2022-01-30 MED ORDER — FUROSEMIDE 10 MG/ML IJ SOLN
40.0000 mg | Freq: Once | INTRAMUSCULAR | Status: AC
  Administered 2022-01-30: 40 mg via INTRAVENOUS
  Filled 2022-01-30: qty 4

## 2022-01-30 NOTE — Chronic Care Management (AMB) (Signed)
Currently admitted in hospital   Jeremiah [name]  Care Coordination Care Guide  Direct Dial: [phone]  

## 2022-01-30 NOTE — Consult Note (Signed)
   [facility] CM Inpatient Consult   01/30/2022  BENTON TOOKER 1940/12/10 [id number]  [place] [facility] [ACO] Patient: Sherre Poot [facility] Medicare  Primary Care Provider:  [name], MD, [facility]  Patient screened for less than 7 days readmission hospitalization with noted extreme high risk score for unplanned readmission risk and to assess for potential [place] [facility] service needs for post hospital transition.    This Probation officer has made a recent referral for care coordination during previous admission. Came by to speak with patient and he was working with a therapist sitting in [place].  No family currently at the bedside.  Progress notes reviewed for MD, PT/OT.   Plan:  Continue to follow progress and disposition to assess for post hospital care management needs.    For questions contact:   Natividad Brood, RN BSN [facility] Liaison  [phone] business mobile phone Toll free office [phone]  Fax number: [phone] [email or web address]

## 2022-01-30 NOTE — Progress Notes (Signed)
Pt placed on CPAP for night rest with oxygen bled in.

## 2022-01-30 NOTE — Progress Notes (Signed)
Occupational Therapy Treatment Patient Details Name: Jeremiah [name] MRN: [id number] DOB: 1940/11/21 Today's Date: 01/30/2022   History of present illness 81 y.o. male presents to [facility] hospital on 01/27/2022 with SOB and generalized weakness. Pt recently admitted with NSTEMI, underwent PCI to stenting on 01/24/2022, discharged 9/6. Pt now admitted for management of HCAP. PMH: BPH, DM, HTN, OSA on CPAP, AAA, prostate CA s/p raditation, NPH s/p VP shunt   OT comments  Patient received in supine and agreeable to OT session. Patient on 4 liters of O2 and SpO2 remained in 97 during functional tasks. Patient was able to get to EOB without assistance and performed mobility without an assistive device. Patient performed self care at sink without a seated rest break. Patient provided handout for energy conservation strategies and reviewed with patient. Acute OT to continue to follow.    Recommendations for follow up therapy are one component of a multi-disciplinary discharge planning process, led by the attending physician.  Recommendations may be updated based on patient status, additional functional criteria and insurance authorization.    Follow Up Recommendations  No OT follow up    Assistance Recommended at Discharge None  Patient can return home with the following  Assist for transportation   Equipment Recommendations  None recommended by OT    Recommendations for Other Services      Precautions / Restrictions Precautions Precautions: None Precaution Comments: monitor SpO2, desat on room air 9/9 Restrictions Weight Bearing Restrictions: No       Mobility Bed Mobility Overal bed mobility: Modified Independent             General bed mobility comments: no assistance to get to EOB    Transfers Overall transfer level: Independent Equipment used: None Transfers: Sit to/from [employer] transfer comment: brief LOB when turning from sink but able to correct      Balance Overall balance assessment: Needs assistance Sitting-balance support: Feet supported, No upper extremity supported Sitting balance-Leahy Scale: Normal     Standing balance support: No upper extremity supported, During functional activity Standing balance-Leahy Scale: Good Standing balance comment: able to stand at sink for self care task but had LOB when turning from sink but able to correct                           ADL either performed or assessed with clinical judgement   ADL Overall ADL's : Needs assistance/impaired     Grooming: Wash/dry hands;Wash/dry face;Oral care;Supervision/safety;Standing Grooming Details (indicate cue type and reason): at sink                 Toilet Transfer: Supervision/safety;Ambulation;Regular Glass blower/designer Details (indicate cue type and reason): no assistive device Toileting- Clothing Manipulation and Hygiene: Supervision/safety Toileting - Clothing Manipulation Details (indicate cue type and reason): performed seated       General ADL Comments: stood at sink without an assistive device    Extremity/Trunk Assessment              Vision       Perception     Praxis      Cognition Arousal/Alertness: Awake/alert Behavior During Therapy: WFL for tasks assessed/performed Overall Cognitive Status: Within Functional Limits for tasks assessed                         Following Commands: Follows  one step commands consistently, Follows one step commands with increased time, Follows multi-step commands inconsistently Safety/Judgement: Decreased awareness of safety, Decreased awareness of deficits Awareness: Intellectual, Emergent Problem Solving: Slow processing General Comments: oriented and followed directions        Exercises      Shoulder Instructions       General Comments provided handout for energy conservation and reviewed with patient.    Pertinent Vitals/ Pain       Pain  Assessment Pain Assessment: No/denies pain  Home Living                                          Prior Functioning/Environment              Frequency  Min 2X/week        Progress Toward Goals  OT Goals(current goals can now be found in the care plan section)  Progress towards OT goals: Progressing toward goals  Acute Rehab OT Goals Patient Stated Goal: go home OT Goal Formulation: With patient Time For Goal Achievement: 02/10/22 Potential to Achieve Goals: Good ADL Goals Pt Will Perform Grooming: Independently;standing;sitting Pt Will Perform Lower Body Dressing: Independently;sit to/from stand Pt Will Transfer to Toilet: Independently;ambulating;regular height toilet Pt Will Perform Toileting - Clothing Manipulation and hygiene: Independently;sit to/from stand Additional ADL Goal #1: Pt will recall and complete 3 step trail making task with independence, using compensatory techniques as needed.  Plan Discharge plan remains appropriate    Co-evaluation                 AM-PAC OT "6 Clicks" Daily Activity     Outcome Measure   Help from another person eating meals?: None Help from another person taking care of personal grooming?: None Help from another person toileting, which includes using toliet, bedpan, or urinal?: A Little Help from another person bathing (including washing, rinsing, drying)?: A Little Help from another person to put on and taking off regular upper body clothing?: None Help from another person to put on and taking off regular lower body clothing?: A Little 6 Click Score: 21    End of Session Equipment Utilized During Treatment: Oxygen  OT Visit Diagnosis: Unsteadiness on feet (R26.81)   Activity Tolerance Patient tolerated treatment well   Patient Left in chair;with call bell/phone within reach   Nurse Communication Mobility status        Time: 6213-0865 OT Time Calculation (min): 31 min  Charges: OT  General Charges $OT Visit: 1 Visit OT Treatments $Self Care/Home Management : 8-22 mins $Therapeutic Activity: 8-22 mins  Lodema Hong, [place]  Office [phone]   Trixie Dredge 01/30/2022, 2:00 PM

## 2022-01-30 NOTE — TOC Progression Note (Signed)
Transition of Care [facility]) - Progression Note    Patient Details  Name: Jeremiah [name] MRN: [id number] Date of Birth: [birth year]  Transition of Care [facility]) CM/SW Contact  Zenon Mayo, RN Phone Number: 01/30/2022, 4:56 PM  Clinical Narrative:    From home with wife, presents with HAP, s/p PCI on 9/5, 2 liters home oxygen is baseline, now on 4 liters, wife has dementia, they have an aide that comes twice a week.  Per Staff RN patient can not tell you the year it is. Conts on iv lasix and Iv abx.  TOC following.        Expected Discharge Plan and Services                                                 Social Determinants of Health (SDOH) Interventions Housing Interventions: Intervention Not Indicated  Readmission Risk Interventions     No data to display

## 2022-01-30 NOTE — Progress Notes (Signed)
Progress Note   Patient: Jeremiah [name] JOI:[id number] DOB: [birth year] DOA: 01/27/2022     3 DOS: the patient was seen and examined on 01/30/2022   Brief hospital course: 81 y.o. male with medical history significant for NPH post VP shunt, type 2 diabetes, chronic hypoxic respiratory failure on 2 L nasal cannula, hypertension, hyperlipidemia, recently admitted for NSTEMI, coronary artery disease status post PCI with stenting on 01/24/2022 on DAPT, chronic diastolic CHF, who presented to [facility] ED from home with complaints of shortness of breath and generalized weakness.  Associated with lethargy and a nonproductive cough.  No subjective fevers or chills.  No chest pain.  EMS was activated.  Noted to be tachypneic with respiration rate in the 30s to 40s.  In the ED, O2 saturation in the 80s on home oxygen 2 L nasal cannula.  O2 supplementation was increased to 4 L nasal cannula to maintain O2 saturation greater than 90%.  CTA negative for pulmonary embolism but showed bilateral lower lobe infiltrates.  The patient was started on empiric IV antibiotics for HCAP in the ED, cefepime and IV vancomycin.  Assessment and Plan: HCAP Recent discharge from the hospital on 01/25/2022 was admitted for NSTEMI, had PCI with stent placement. Personal reviewed of CT scan, bilateral pleural effusions right greater than left, could not rule out infiltrates in lower lobes bilaterally. Initially on cefepime and IV vancomycin. MRSA neg, thus vanc stopped Holding off IV fluid for now due to bilateral pleural effusions and worsening chronic hypoxia. Incentive spirometer Mobilize as tolerated   Acute on chronic hypoxic respiratory failure secondary to above On 2 L nasal cannula oxygen supplementation at baseline. O2 requirements up to 4L overnight Procalcitonin is <0.1 Given trial of IV lasix Wean O2 as tolerated   Generalized weakness PT OT consulted with no therapy recs identified Fall precautions.   Recent NSTEMI,  status post PCI with stent placement Coronary artery disease  S/p LHC and DES stent to RPDA on 9/5. Cont home DAPT Resume home Zetia and Crestor Denies any anginal symptoms.   Chronic diastolic CHF Continue home cardiac medications Strict I's and O's and daily weight -given trial of IV lasix with very good diuresis thus far -recheck bmet in AM   Type 2 diabetes with hyperglycemia A1c 7.9 on 01/21/2022. Hold off home oral hypoglycemics Start insulin sliding scale.   Hypertension, BP not at goal, elevated Resume home regimen Monitor vital signs   GERD Resume home PPI      Subjective:reports breathing somewhat better since receiving lasix this AM  Physical Exam: Vitals:   01/29/22 2240 01/30/22 0500 01/30/22 0851 01/30/22 1035  BP: 138/73 131/70 (!) 142/80 (!) 149/89  Pulse: 79 73 74 79  Resp: (!) '21 20 18 20  '$ Temp: 98.7 F (37.1 C) 97.7 F (36.5 C)  98.5 F (36.9 C)  TempSrc: Oral Oral  Oral  SpO2: 94% 94% 97% 96%  Weight:  89.2 kg    Height:       General exam: Awake, laying in bed, in nad Respiratory system: Normal respiratory effort, no wheezing Cardiovascular system: regular rate, s1, s2 Gastrointestinal system: Soft, nondistended, positive BS Central nervous system: CN2-12 grossly intact, strength intact Extremities: Perfused, no clubbing Skin: Normal skin turgor, no notable skin lesions seen Psychiatry: Mood normal // no visual hallucinations   Data Reviewed:  Labs reviewed: Na 144, K 3.9, 1.48   Family Communication: Pt in room, family not at bedside  Disposition: Status is: Inpatient Remains inpatient appropriate  because: Severity of illness  Planned Discharge Destination: Home    Author: Marylu Lund, MD 01/30/2022 5:29 PM  For on call review [email or web address].

## 2022-01-31 ENCOUNTER — Inpatient Hospital Stay (HOSPITAL_COMMUNITY): Payer: Medicare Other

## 2022-01-31 DIAGNOSIS — R4182 Altered mental status, unspecified: Secondary | ICD-10-CM | POA: Diagnosis not present

## 2022-01-31 DIAGNOSIS — J189 Pneumonia, unspecified organism: Secondary | ICD-10-CM | POA: Diagnosis not present

## 2022-01-31 DIAGNOSIS — R778 Other specified abnormalities of plasma proteins: Secondary | ICD-10-CM | POA: Diagnosis not present

## 2022-01-31 DIAGNOSIS — J9601 Acute respiratory failure with hypoxia: Secondary | ICD-10-CM | POA: Diagnosis not present

## 2022-01-31 LAB — BLOOD GAS, ARTERIAL
Acid-Base Excess: 10.2 mmol/L — ABNORMAL HIGH (ref 0.0–2.0)
Bicarbonate: 38.2 mmol/L — ABNORMAL HIGH (ref 20.0–28.0)
O2 Saturation: 97.8 %
Patient temperature: 36.9
pCO2 arterial: 66 mmHg (ref 32–48)
pH, Arterial: 7.37 (ref 7.35–7.45)
pO2, Arterial: 75 mmHg — ABNORMAL LOW (ref 83–108)

## 2022-01-31 LAB — COMPREHENSIVE METABOLIC PANEL
ALT: 10 U/L (ref 0–44)
AST: 14 U/L — ABNORMAL LOW (ref 15–41)
Albumin: 2.9 g/dL — ABNORMAL LOW (ref 3.5–5.0)
Alkaline Phosphatase: 42 U/L (ref 38–126)
Anion gap: 8 (ref 5–15)
BUN: 28 mg/dL — ABNORMAL HIGH (ref 8–23)
CO2: 34 mmol/L — ABNORMAL HIGH (ref 22–32)
Calcium: 8.9 mg/dL (ref 8.9–10.3)
Chloride: 103 mmol/L (ref 98–111)
Creatinine, Ser: 1.42 mg/dL — ABNORMAL HIGH (ref 0.61–1.24)
GFR, Estimated: 49 mL/min — ABNORMAL LOW (ref >60)
Glucose, Bld: 145 mg/dL — ABNORMAL HIGH (ref 70–99)
Potassium: 3.6 mmol/L (ref 3.5–5.1)
Sodium: 145 mmol/L (ref 135–145)
Total Bilirubin: 0.8 mg/dL (ref 0.3–1.2)
Total Protein: 5.7 g/dL — ABNORMAL LOW (ref 6.5–8.1)

## 2022-01-31 LAB — GLUCOSE, CAPILLARY
Glucose-Capillary: 118 mg/dL — ABNORMAL HIGH (ref 70–99)
Glucose-Capillary: 143 mg/dL — ABNORMAL HIGH (ref 70–99)
Glucose-Capillary: 149 mg/dL — ABNORMAL HIGH (ref 70–99)
Glucose-Capillary: 150 mg/dL — ABNORMAL HIGH (ref 70–99)

## 2022-01-31 LAB — CBC
HCT: 42 % (ref 39.0–52.0)
Hemoglobin: 13.5 g/dL (ref 13.0–17.0)
MCH: 31.7 pg (ref 26.0–34.0)
MCHC: 32.1 g/dL (ref 30.0–36.0)
MCV: 98.6 fL (ref 80.0–100.0)
Platelets: 153 10*3/uL (ref 150–400)
RBC: 4.26 MIL/uL (ref 4.22–5.81)
RDW: 13.9 % (ref 11.5–15.5)
WBC: 7.5 10*3/uL (ref 4.0–10.5)
nRBC: 0 % (ref 0.0–0.2)

## 2022-01-31 MED ORDER — IOHEXOL 350 MG/ML SOLN
100.0000 mL | Freq: Once | INTRAVENOUS | Status: AC | PRN
  Administered 2022-01-31: 100 mL via INTRAVENOUS

## 2022-01-31 NOTE — Care Management Important Message (Signed)
Important Message  Patient Details  Name: Jeremiah [name] MRN: [id number] Date of Birth: [birth year]   Medicare Important Message Given:  Yes     Shelda Altes 01/31/2022, 8:33 AM

## 2022-01-31 NOTE — Progress Notes (Signed)
Progress Note   Patient: Jeremiah [name] EHO:[id number] DOB: [birth year] DOA: 01/27/2022     4 DOS: the patient was seen and examined on 01/31/2022   Brief hospital course: 81 y.o. male with medical history significant for NPH post VP shunt, type 2 diabetes, chronic hypoxic respiratory failure on 2 L nasal cannula, hypertension, hyperlipidemia, recently admitted for NSTEMI, coronary artery disease status post PCI with stenting on 01/24/2022 on DAPT, chronic diastolic CHF, who presented to [facility] ED from home with complaints of shortness of breath and generalized weakness.  Associated with lethargy and a nonproductive cough.  No subjective fevers or chills.  No chest pain.  EMS was activated.  Noted to be tachypneic with respiration rate in the 30s to 40s.  In the ED, O2 saturation in the 80s on home oxygen 2 L nasal cannula.  O2 supplementation was increased to 4 L nasal cannula to maintain O2 saturation greater than 90%.  CTA negative for pulmonary embolism but showed bilateral lower lobe infiltrates.  The patient was started on empiric IV antibiotics for HCAP in the ED, cefepime and IV vancomycin.  Assessment and Plan: HCAP Recent discharge from the hospital on 01/25/2022 was admitted for NSTEMI, had PCI with stent placement. Personal reviewed of CT scan, bilateral pleural effusions right greater than left, could not rule out infiltrates in lower lobes bilaterally. Initially on cefepime and IV vancomycin. MRSA neg, thus vanc stopped. Procal <0.1. Will hold further abx Holding off IV fluid for now due to bilateral pleural effusions and worsening chronic hypoxia. Incentive spirometer Mobilize as tolerated   Acute on chronic hypoxic respiratory failure secondary to above On 2 L nasal cannula oxygen supplementation at baseline. O2 requirements up to 4L initially, now down to [facility] this AM after trial of IV lasix Procalcitonin is <0.1. Will hold further abx Cont to wean O2 as tolerated   Generalized  weakness PT OT consulted with no therapy recs identified Fall precautions.   Recent NSTEMI, status post PCI with stent placement Coronary artery disease  S/p LHC and DES stent to RPDA on 9/5. Cont home DAPT continue home Zetia and Crestor Denies any anginal symptoms.   Chronic diastolic CHF Continue home cardiac medications Strict I's and O's and daily weight -given trial of IV lasix with very good diuresis -recheck bmet in AM   Type 2 diabetes with hyperglycemia A1c 7.9 on 01/21/2022. Hold off home oral hypoglycemics Continue SSI as needed Glycemic trends stable   Hypertension, BP not at goal, elevated Resume home regimen Monitor vital signs   GERD Resume home PPI  Toxic metabolic encephalopathy -New confusion this AM that prompted rapid response eval -Ordered and reviewed ABG - finding of new pco2 of 66. Seemed improved with trial of bipap this AM -Later in day, concern of new slurred speech and continued confusion. Code stroke was called -STAT Head CT ordered. Neuro consulted -Discussed with Neurology. Recs for brain MRI, urgent EEG      Subjective:Confused this AM, Difficult to assess given mentation  Physical Exam: Vitals:   01/31/22 0654 01/31/22 0726 01/31/22 0728 01/31/22 0924  BP: (!) 154/91 (!) 154/87    Pulse: 77 77 76   Resp:  18    Temp: 98 F (36.7 C) 98.4 F (36.9 C)    TempSrc: Oral Oral    SpO2: 96% 97% 98% 95%  Weight:      Height:       General exam: Conversant, in no acute distress Respiratory system: normal chest  rise, clear, no audible wheezing Cardiovascular system: regular rhythm, s1-s2 Gastrointestinal system: Nondistended, nontender, pos BS Central nervous system: No seizures, no tremors Extremities: No cyanosis, no joint deformities Skin: No rashes, no pallor Psychiatry: Difficult to assess given mentation  Data Reviewed:  Labs reviewed: Na 145, K 3.6, 1.42   Family Communication: Pt in room, family over  phone  Disposition: Status is: Inpatient Remains inpatient appropriate because: Severity of illness  Planned Discharge Destination: Home    Author: Marylu Lund, MD 01/31/2022 11:31 AM  For on call review [email or web address].

## 2022-01-31 NOTE — Progress Notes (Signed)
Received pt from 3 E, alert but confused, with O2 via [place] at 2 lpm, not in distress with EEG monitor, , with peripheral line at right [facility] and left FA, good flush but no back flow, with external male catheter connected to to low suction machine, connected to tele box informed telemetry departments kin integrity assessed verified by CN Abbe [place]

## 2022-01-31 NOTE — Progress Notes (Signed)
Last sugar 143 @ 1116

## 2022-01-31 NOTE — Progress Notes (Signed)
CSW called pt's daughter [name] in response to [facility] consult. No answer; left voicemail requesting return call.

## 2022-01-31 NOTE — Significant Event (Addendum)
Rapid Response Event Note   Reason for Call :  confusion  Initial Focused Assessment:  Patient is alert and O x 1 sitting in bed. He will answer questions but is confused.  He does follow commands,  NIHSS 2 for missed questions.  Lungs sounds with scattered crackles.  Heart tones regular  BP 154/87  SR 76  RR 18  O2 sat 98% on 4L [place] oral temp 98.4 CBG 150   Interventions:  Weaned O2 to 2L [place]   ABG  Plan of Care:  If CO2 elevated on ABG: plan Bipap  FU 1200: He wore Bipap for about 2 hours.  Initially RN felt he was doing better than this morning.  A few minutes later he started repeating words.  Reassessed NIHSS  Code Stroke Called:  see Code Documentation Note   Event Summary:   MD NotifiedWyline Copas Call Time: [phone] Arrival Time: 0710 End Time:  0745  Raliegh Ip, RN

## 2022-01-31 NOTE — Progress Notes (Signed)
Pt placed on Dream station in BiPAP mode  per MD. Pt tolerating well at this time, vitals stable, no increased WOB noted, SPO2 96%, RN aware,MD aware, RT will monitor.      01/31/22 0910  BiPAP/CPAP/SIPAP  $ Non-Invasive Ventilator  Non-Invasive Vent Subsequent  BiPAP/CPAP/SIPAP Pt Type Adult  Mask Type Full face mask  Mask Size Small  Respiratory Rate 16 breaths/min  IPAP 10 cmH20  EPAP 5 cmH2O  Flow Rate 2 lpm  BiPAP/CPAP/SIPAP BiPAP  Patient Home Equipment No  Auto Titrate No

## 2022-01-31 NOTE — Progress Notes (Deleted)
Cardiology Office Note:    Date:  01/31/2022   ID:  Jeremiah [name], DOB 1941-03-13, MRN [id number]  PCP:  Jeremiah Infante, MD  Cardiologist:  None  Electrophysiologist:  None   Referring MD: Jeremiah Infante, MD   Chief Complaint: hospital follow-up of NSTEMI  History of Present Illness:    Jeremiah [name] is a 81 y.o. 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 [name] and presents today for hospital follow-up of NSTEMI.  Patient was last seen by Dr. [name] in 03/2019 at which time he was struggling with marked exhaustion/exertional fatigue.  He underwent a left cardiac catheterization in 02/2019 for further evaluation of the symptoms which showed mild nonobstructive CAD.  Symptoms were not felt to be cardiac in nature.  Patient was recently admitted from 01/20/2022 to 01/25/2022 for NSTEMI after presnting with chest pain and epigastric pain.  Echo showed LVEF of 55-60%, mildly reduced RV function with hypokinesis of the mid ventricle and relative apical's bearing, dilated coronary sinus, mild MR, mild to moderate AI, and mild dilatation of the aortic root measuring 42 mm.  LHC showed 90% stenosis of RPDA, 50% stenosis of proximal LAD, 50% stenosis of 1.st Diag, and 15% stenosis of proximal to mid RCA.  He underwent successful PCI with DES to the RPDA lesion and was started on DAPT with Aspirin and Plavix.  Home Coreg was increased and he was added on Farxiga and Crestor.   Patient presented back to the ED on 01/27/2022 via EMS for further evaluation of lethargy and significant decrease in energy. Patient was also noted to be tachypneic and hypoxic with O2 sats in the  80s on home O2. CTA was negative for PE but showed bilateral lower lobe infiltrates. She was started on antibiotics and admitted for healthcare acquired pneumonia. Hospital was complicated by altered mental status. Code stroke was called on 01/31/2022 due to confusion. ***  Patient presents today for follow-up. ***  CAD with Recent NSTEMI Recent admitted with NSTEMI and underwent PCI with DES to  [place]. - No chest pain.  - Continue DAPT with Aspirin and Plavix. - Continue beta-blocker and high-intensity statin/Zetia.  Mildly Dilated Aortic Root Mild to Moderate Aortic Regurgitation Echo during recent admission showed mild to moderate AI and mild dilatation of the aortic root measuring 42 mm. - Consider repeat Echo in 1 year.  AAA Last ultrasound in 02/2021 showed abnormal dilatation of the distal abdominal aorta measuring 4.2cm at the largest point. Increased from 3.9 cm on prior imaging in 2021. Also showed dilatation of the right and left common iliac arteries.  - Followed by Vascular Surgery (Dr. Carlis [name]). Recommended following up with them as he is due for repeat imagining.   Chronic Hypoxic Respiratory Failure Patient on 2L of O2 via nasal cannula at home. Recently admitted with healthcare acquired pneumonia. - ***  Hypertension BP *** - Continue Coreg 12.'5mg'$  twice daily  Hyperlipidemia Lipid panel on 01/21/2022 during recent admission: Total Cholesterol 190, Triglycerides 264, HDL 40, LDL 97. LDL goal <70 given CAD. - Previously intolerant to Lipitor. Started on Crestor '10mg'$  daily during recent admission. Continue. - Continue Zetia '10mg'$  daily. - Will repeat lipid  panel and LFTs in 6-8 weeks.  Type 2 Diabetes Mellitus Hemoglobin A1c 7.9%.  - On Metformin, Farxiga, and Insulin.  CKD Stage III Baseline around 1.0 to 1.3. 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   S/P radiation therapy 04/23/13 - 05/29/14   Prostate/seminal vesicles, external beam 4500 cGy in 25 sessions   Seasonal allergies    Skin cancer    scalp   Small bowel obstruction ([place]) 06/10/2016   Wears glasses     Past Surgical History:  Procedure Laterality Date   BACK SURGERY  2009   COLONOSCOPY W/ BIOPSIES AND POLYPECTOMY     CORONARY STENT INTERVENTION N/A 01/24/2022   Procedure: CORONARY STENT INTERVENTION;  Surgeon: Jeremiah [name], Jeremiah M, MD;  Location: [facility];  Service: Cardiovascular;  Laterality: N/A;   CYSTOSCOPY W/ URETERAL STENT PLACEMENT Left 05/12/2021   Procedure: CYSTOSCOPY WITH RETROGRADE PYELOGRAM/ LEFT URETERAL STENT PLACEMENT.;  Surgeon: Jeremiah Lima, MD;  Location: [facility];  Service: Urology;  Laterality: Left;   [place]   LEFT HEART CATH AND CORONARY ANGIOGRAPHY N/A 03/14/2019   Procedure: LEFT HEART CATH AND CORONARY ANGIOGRAPHY;  Surgeon: Jeremiah Blanks, MD;  Location: [place] [facility];  Service: Cardiovascular;  Laterality: N/A;   LEFT HEART CATH AND CORONARY ANGIOGRAPHY N/A 01/24/2022   Procedure: LEFT HEART CATH AND CORONARY ANGIOGRAPHY;  Surgeon: Jeremiah [name], Jeremiah M, MD;  Location: [place] [facility];  Service: Cardiovascular;  Laterality: N/A;   PROSTATE BIOPSY  2013, 2014, 01/2014   Gleason 7   RADIOACTIVE SEED IMPLANT N/A 07/01/2014   Procedure: RADIOACTIVE SEED IMPLANT;  Surgeon: Jeremiah Amass, MD;  Location: [facility];  Service: Urology;  Laterality: N/A;  DR PORTABLE   TONSILLECTOMY     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   Procedure: SHUNT INSERTION VENTRICULAR-PERITONEAL;  Surgeon: Jeremiah Moore, MD;  Location: [facility];  Service: Neurosurgery;  Laterality: N/A;  SHUNT INSERTION  VENTRICULAR-PERITONEAL    Current Medications: No outpatient medications have been marked as taking for the 02/03/22 encounter (Appointment) with Darreld Mclean, PA-C.     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  Tobacco Use   Smoking status: Former    Types: Cigarettes    Quit date: 03/10/1961    Years since quitting: 60.9   Smokeless tobacco: Never  Vaping Use   Vaping Use: Never used  Substance and Sexual Activity   Alcohol use: Yes    Alcohol/week: 1.0 standard drink of alcohol    Types: 1 Glasses of wine per week    Comment: rare alcohol   Drug use: No   Sexual activity: Not on file  Other Topics Concern   Not on file  Social  History Narrative   Rare caffeine use    Social Determinants of Health   Financial Resource Strain: Not on file  Food Insecurity: No Food Insecurity (01/28/2022)   Hunger Vital Sign    Worried About Running Out of Food in the Last Year: Never true    Ran Out of Food in the Last Year: Never true  Transportation Needs: No Transportation Needs (01/28/2022)   PRAPARE - Hydrologist (Medical): No    Lack of Transportation (Non-Medical): No  Physical Activity: Unknown (06/13/2018)   Exercise Vital Sign    Days of Exercise per Week: Patient refused    Minutes of Exercise per Session: Patient refused  Stress: No Stress Concern Present (06/13/2018)   [place]    Feeling of Stress : Only a little  Social Connections: Unknown (06/13/2018)   Social Connection and Isolation Panel [NHANES]    Frequency of Communication with Friends and Family: More than three times a week    Frequency of Social  Gatherings with Friends and Family: More than three times a week    Attends Religious Services: Not on Advertising copywriter or Organizations: Not on file    Attends Archivist Meetings: Not on file    Marital Status: Not on file     Family History: The patient's family history includes Cancer in his father and paternal uncle; Dementia (age of onset: 79) in his mother; Diabetes in his father; Heart attack (age of onset: 55) in his father; Lung cancer in his brother.  ROS:   Please see the history of present illness.     EKGs/Labs/Other Studies Reviewed:    The following studies were reviewed:  AAA Duplex 03/01/2021: Summary: Abdominal Aorta: There is evidence of abnormal dilatation of the distal  Abdominal aorta. The largest aortic measurement is 4.2 cm. The largest  aortic diameter has increased compared to prior exam. Previous diameter  measurement was 3.9 cm obtained on  12/02/2019. Dilatation of the right and left common iliac arteries. _______________  Echocardiogram 01/21/2022:  Impressions: 1. Left ventricular ejection fraction, by estimation, is 55 to 60%. The  left ventricle has normal function. Left ventricular endocardial border  not optimally defined to evaluate regional wall motion. Left ventricular  diastolic parameters are  indeterminate.   2. Right ventricular systolic function is mildly reduced with hypokinesis  of the mid ventricle and relative apical sparing. The right ventricular  size is mildly enlarged. IVC not assessed, cannot calculate RVSP.   3. Dilated coronary sinus.   4. The mitral valve is normal in structure. Mild mitral valve  regurgitation. No evidence of mitral stenosis.   5. The aortic valve is tricuspid. Aortic valve regurgitation is mild to  moderate. No aortic stenosis is present.   6. Aortic dilatation noted. There is mild dilatation of the aortic root,  measuring 42 mm.  _______________  Left Cardiac Catheterization  01/24/2022:   Prox LAD lesion is 50% stenosed.   1st Diag lesion is 50% stenosed.   Prox RCA to Mid RCA lesion is 15% stenosed.   RPDA lesion is 90% stenosed.   A drug-eluting stent was successfully placed using a STENT ONYX FRONTIER 2.5X12.   Post intervention, there is a 0% residual stenosis.   LV end diastolic pressure is normal.   Single vessel obstructive CAD involving the proximal PDA Normal LVEDP Successful PCI of the PDA with DES  x 1   Plan: DAPT for one year. Anticipate DC in am  Diagnostic Dominance: Right  Intervention      EKG:  EKG ordered today. EKG personally reviewed and demonstrates ***.  Recent Labs: 08/12/2021: B Natriuretic Peptide 40.1 01/28/2022: Magnesium 1.9 01/31/2022: ALT 10; BUN 28; Creatinine, Ser 1.42; Hemoglobin 13.5; Platelets 153; Potassium 3.6; Sodium 145  Recent Lipid Panel    Component Value Date/Time   CHOL 190 01/21/2022 0200   TRIG 264 (H) 01/21/2022 0200   HDL 40 (L) 01/21/2022 0200   CHOLHDL 4.8 01/21/2022 0200   VLDL 53 (H) 01/21/2022 0200   LDLCALC 97 01/21/2022 0200    Physical Exam:    Vital Signs: There were no vitals taken for this visit.    Wt Readings from Last 3 Encounters:  01/31/22 189 lb 13.1 oz (86.1 kg)  01/25/22 195 lb 12.3 oz (88.8 kg)  08/12/21 187 lb 13.3 oz (85.2 kg)     General: 81 y.o. male in no acute distress. HEENT: Normocephalic and atraumatic. Sclera clear. EOMs intact. Neck: Supple. No carotid bruits. No JVD. Heart: *** RRR. Distinct S1 and S2. No murmurs, gallops, or rubs. Radial and distal pedal pulses 2+ and equal bilaterally. Lungs: No increased work of breathing. Clear to ausculation bilaterally. No wheezes, rhonchi, or rales.  Abdomen: Soft, non-distended, and non-tender to palpation. Bowel sounds present in all 4 quadrants.  MSK: Normal strength and tone for age. *** Extremities: No lower extremity edema.    Skin: Warm and dry. Neuro: Alert and oriented x3. No focal deficits. Psych: Normal  affect. Responds appropriately.   Assessment:    No diagnosis found.  Plan:     Disposition: Follow up in ***   Medication Adjustments/Labs and Tests Ordered: Current medicines are reviewed at length with the patient today.  Concerns regarding medicines are outlined above.  No orders of the defined types were placed in this encounter.  No orders of the defined types were placed in this encounter.   There are no Patient Instructions on file for this visit.   Signed, Darreld Mclean, PA-C  01/31/2022 1:40 PM    [facility]

## 2022-01-31 NOTE — Code Documentation (Signed)
Stroke Response Nurse Documentation Code Documentation  AMR STURTEVANT is a 81 y.o. male admitted to [facility]  on 01/21/2022 for pneumonia with past medical hx of VP Shunt. Code stroke was activated by 3 [place] after patient was noted to have difficulty speaking.   Patient on 3E unit where he was LKW at 0600. AT 0700, the patient was noted to be confused upon initial day assessment, but the patient did not have speech difficulty. At 1200 pt was noted to be able to answer yes or no questions, but when the RN started to have a conversation she noted that the patient was having word finding difficulty and repetitive speech. Rapid Response called and Code Stroke initiated.  Stroke team at the bedside after patient activation. Patient to CT with team. NIHSS 4, see documentation for details and code stroke times. Patient with disoriented, left decreased sensation, and Global aphasia  on exam. The following imaging was completed:  CT Head, CTA, and CTP. Patient is not a candidate for IV Thrombolytic due to being outside window. Patient is not a candidate for IR due to no LVO on imaging per MD.   Care/Plan: Stat EEG placed, q2 Neuro Checks.   Bedside handoff with RN Delana Meyer.    Kathrin Greathouse  Stroke Response RN

## 2022-01-31 NOTE — Progress Notes (Addendum)
vLTM started  Atrium to monitor   All impedances below 10kohms  Patient event button tested  MRI compatible leads used.

## 2022-01-31 NOTE — Progress Notes (Signed)
Pt speech seems to be repetitive and difficult time identifying objects . Rapid Nurse at bedside. MD notified  Stat head CT order

## 2022-01-31 NOTE — Progress Notes (Signed)
STAT EEG complete - results pending. ? ?

## 2022-01-31 NOTE — Consult Note (Signed)
Neurology Consultation Reason for Consult: Altered mental status code stroke Requesting Physician: Marylu Lund  CC: Nursing concern for aphasia  History is obtained from: Chart review and medical team  HPI: Jeremiah [name] is a 81 y.o. male with a past medical history significant for normal pressure hydrocephalus s/p VP shunt, chronic hypoxic respiratory failure (2 L O2 at home), type II diabetes, hypertension, hyperlipidemia, coronary artery disease s/p stent (01/24/2022 on dual antiplatelet therapy, discharged 9/6 to home), chronic diastolic congestive heart failure, CKD, chronic subdurals, who initially presented 01/27/2022 for shortness of breath, lethargy, nonproductive cough, generalized weakness, saturating 80% on his home 2 L O2.  He was found to have bilateral lower lobe infiltrates and started on cefepime and vancomycin for HCAP, narrowed to cefepime alone.  He has also been receiving Lasix for diuresis  He was last his normal self at 6 AM.  At 7 AM he was noted to be disoriented but otherwise speaking appropriately.  He was subsequently put back on CPAP and discovered to be more confused with concern for aphasia due to repetitive statements and inability to identify all objects/follow all commands for which stroke code stroke was activated  At baseline primary caregiver for while with dementia.  His last hospitalization was complicated by delirium  LKW: 6 AM Thrombolytic given?: No, out of the window, chronic subdural hemorrhages, recent MI IA performed?: No, exam not consistent with LVO Premorbid modified rankin scale: 2     2 - Slight disability. Able to look after own affairs without assistance, but unable to carry out all previous activities.  ROS: Unable to obtain due to altered mental status.   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   S/P radiation therapy 04/23/13 - 05/29/14   Prostate/seminal vesicles, external beam 4500 cGy in 25 sessions   Seasonal allergies    Skin cancer    scalp   Small bowel obstruction ([place]) 06/10/2016   Wears glasses    Past Surgical History:  Procedure Laterality Date   BACK SURGERY  2009   COLONOSCOPY W/ BIOPSIES AND POLYPECTOMY     CORONARY STENT INTERVENTION N/A 01/24/2022   Procedure: CORONARY STENT INTERVENTION;  Surgeon: [name], [name], MD;  Location: [place] [facility];  Service: Cardiovascular;  Laterality: N/A;   CYSTOSCOPY W/ URETERAL STENT PLACEMENT Left 05/12/2021   Procedure: CYSTOSCOPY WITH RETROGRADE PYELOGRAM/ LEFT URETERAL STENT PLACEMENT.;  Surgeon: [name], MD;  Location: [place];  Service: Urology;  Laterality: Left;   [place]   LEFT HEART CATH AND CORONARY ANGIOGRAPHY N/A 03/14/2019   Procedure: LEFT HEART CATH AND CORONARY ANGIOGRAPHY;  Surgeon: [name], MD;  Location: [place] [facility];  Service: Cardiovascular;  Laterality: N/A;   LEFT HEART CATH AND CORONARY ANGIOGRAPHY N/A 01/24/2022   Procedure: LEFT HEART CATH AND CORONARY ANGIOGRAPHY;  Surgeon: [name], [name], MD;  Location: [facility];  Service: Cardiovascular;  Laterality: N/A;   PROSTATE BIOPSY  2013, 2014, 01/2014   Gleason 7   RADIOACTIVE SEED IMPLANT N/A 07/01/2014   Procedure:  RADIOACTIVE SEED IMPLANT;  Surgeon: [name], MD;  [facility];  Service: Urology;  Laterality: N/A;  DR PORTABLE   TONSILLECTOMY     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   Procedure: SHUNT INSERTION VENTRICULAR-PERITONEAL;  Surgeon: [name], MD;  Location: [place];  Service: Neurosurgery;  Laterality: N/A;  SHUNT INSERTION VENTRICULAR-PERITONEAL   Current Outpatient  Medications  Medication Instructions   Acetaminophen Extra Strength 500 mg, Oral, Every 6 hours PRN   aspirin EC 81 mg, Oral, Daily   carvedilol (COREG) 12.5 mg, Oral, 2 times daily   cetirizine (ZYRTEC) 10 mg, Oral, Daily   cholecalciferol (VITAMIN D3) 1,000 Units, Oral, Daily   clopidogrel (PLAVIX) 75 mg, Oral, Daily with breakfast   dapagliflozin propanediol (FARXIGA) 10 mg, Oral, Daily before breakfast   ezetimibe (ZETIA) 10 mg, Oral, Daily   meclizine (ANTIVERT) 25 mg, Oral, 3 times daily PRN   Melatonin 10 mg, Oral, At bedtime PRN   metFORMIN (GLUCOPHAGE-XR) 500 mg, Oral, Daily   pantoprazole (PROTONIX) 20 mg, Oral, Daily before breakfast   rosuvastatin (CRESTOR) 10 mg, Oral, Daily   tamsulosin (FLOMAX) 0.4 mg, Oral, Daily after supper   vitamin B-12 (CYANOCOBALAMIN) 500 mcg, Oral, Daily     Family History  Problem Relation Age of Onset   Heart attack Father 20   Cancer Father        prostate   Diabetes Father    Cancer Paternal Uncle        prostate   Dementia Mother 60   Lung cancer Brother     Social History:  reports that he quit smoking about 60 years ago. His smoking use included cigarettes. He has never used smokeless tobacco. He reports current alcohol use of about 1.0 standard drink of alcohol per week. He reports that he does not use drugs.  Exam: Current vital signs: BP (!) 154/87   Pulse 76   Temp 98.4 F (36.9 C) (Oral)   Resp 18   Ht '5\' 10"'$  (1.778 m)   Wt 86.1 kg   SpO2 95%   BMI 27.24 kg/m  Vital signs in last 24 hours: Temp:  [98 F (36.7 C)-98.5 F (36.9 C)] 98.4 F (36.9 C) (09/12 0726) Pulse Rate:  [75-77] 76 (09/12 0728) Resp:  [14-18] 18 (09/12 0726) BP: (138-154)/(78-91) 154/87 (09/12 0726) SpO2:  [93 %-98 %] 95 % (09/12 0924) Weight:  [86.1 kg] 86.1 kg (09/12 0557)   Physical Exam  Constitutional: Appears pale Psych: Affect flat Eyes: No scleral injection HENT: No oropharyngeal obstruction.  MSK: no joint deformities.   Cardiovascular: Perfusing extremities well Respiratory: Effort normal, non-labored breathing GI: Soft.  No distension. There is no tenderness.  Skin: Warm dry and intact visible skin  Neuro: Mental Status: Patient is awake, alert, disoriented, unable to give any significant history, poor attention/concentration, perseverative  Cranial Nerves: II: Visual Fields are full to blink to threat. Pupils are equal, round, and reactive to light.   III,IV, VI: EOMI to orienting to examiner's voice V: Facial sensation is symmetric to light eyelash brush VII: Facial movement is symmetric.  VIII: hearing is intact to voice X: Uvula elevates symmetrically XI: Shoulder shrug is symmetric. XII: tongue is midline without atrophy or fasciculations.  Motor: Mild pronation without drift of the bilateral upper extremities.  Able to maintain both legs without drift in the bilateral lower extremities Sensory: Unreliable report of sensation likely secondary to his delirium, but does not endorse that things feels the  same on both sides.  Appears grossly equally reactive to touch in all 4 extremities Plantars: Toes are mute bilaterally Cerebellar: FNF intact bilaterally, heel-to-shin mildly ataxic bilaterally likely secondary to his chronic NPH Gait:  Deferred   NIHSS total 4 Score breakdown: Did not answer month or age correctly (2 points), reports sensation not equal in all 4 extremities (1 point), and mild aphasia versus delirium (one-point) Performed at 1239    I have reviewed labs in epic and the results pertinent to this consultation are:  Basic Metabolic Panel: Recent Labs  Lab 01/27/22 1706 01/27/22 2000 01/27/22 2142 01/28/22 0317 01/29/22 0448 01/30/22 0748 01/31/22 0519  NA 145 143  --  145 144 144 145  K 4.2 4.1  --  4.1 4.1 3.9 3.6  CL 107  --   --  109 107 106 103  CO2 27  --   --  26 28 32 34*  GLUCOSE 141*  --   --  129* 176* 139* 145*  BUN 43*  --   --  37* 30* 26* 28*   CREATININE 1.62*  --  1.67* 1.52* 1.47* 1.48* 1.42*  CALCIUM 8.3*  --   --  8.0* 8.4* 8.8* 8.9  MG  --   --   --  1.9  --   --   --   PHOS  --   --   --  4.2  --   --   --     CBC: Recent Labs  Lab 01/27/22 1706 01/27/22 2000 01/27/22 2142 01/28/22 0317 01/29/22 0448 01/30/22 0445 01/31/22 0519  WBC 6.6  --  6.4 7.0 7.1 6.7 7.5  NEUTROABS 4.7  --   --  5.0  --   --   --   HGB 13.7   < > 12.9* 13.0 12.6* 12.8* 13.5  HCT 45.7   < > 42.0 42.4 40.9 41.3 42.0  MCV 105.5*  --  104.5* 103.9* 102.5* 102.5* 98.6  PLT 150  --  143* 130* 133* 141* 153   < > = values in this interval not displayed.    Coagulation Studies: No results for input(s): "LABPROT", "INR" in the last 72 hours.    I have reviewed the images obtained:  Head CT personally reviewed, agree with radiology no acute intracranial process.  There is a shift in place for his known normal pressure hydrocephalus  No LVO on my read of CTA, CT perfusion without any tissue at risk requiring  Impression: Most likely delirium but given multiple risk factors for seizures (shunt, chronic subdural hemorrhages, cefepime), will obtain EEG urgently  Recommendations: -Stat EEG to rule out nonconvulsive status, may convert to long-term EEG monitoring -MRI brain nonurgently to rule out acute intracranial process -Recommend alternative agent to cefepime or discontinuation if possible  Lesleigh Noe MD-PhD [facility] [phone] Available 7 AM to 7 PM, outside these hours please contact Neurologist on call listed on AMION   Total critical care time: 45 minutes   Critical care time was exclusive of separately billable procedures and treating other patients.   Critical care was necessary to treat or prevent imminent or life-threatening deterioration.   Critical care was time spent personally by me on the following activities: development of treatment plan with patient and/or surrogate as well as nursing,  discussions with consultants/primary team, evaluation of patient's response to treatment, examination of patient, obtaining history from patient or surrogate, ordering and performing treatments and interventions, ordering and review of laboratory studies,  ordering and review of radiographic studies, and re-evaluation of patient's condition as needed, as documented above.

## 2022-01-31 NOTE — Progress Notes (Addendum)
While rounding on patient patent noted to be confused and alert to self only  reposes where delayed  respond. Rapid response nurse and Dr Wyline Copas made aware .    01/31/22 0654  Charting Type  Focused Reassessment Changes Noted Neurological  [place] Work Intensity Score (Update with each assessment and as needed)  Work Intensity Score (Level) 3  Neurological  Neuro (WDL) X  Orientation Level Disoriented to place;Disoriented to time;Disoriented to situation  Cognition Appropriate safety awareness;Follows commands  Speech Delayed responses  R Pupil Size (mm) 2  R Pupil Shape Round  R Pupil Reaction Brisk  L Pupil Size (mm) 2  L Pupil Shape Round  L Pupil Reaction Brisk  Facial Symmetry Symmetrical  Facial Droop  (none)  R Hand Grip Moderate  L Hand Grip Moderate  R Foot Dorsiflexion Strong  L Foot Dorsiflexion Strong  R Foot Plantar Flexion Strong  L Foot Plantar Flexion Strong  R Finger to Nose (Point to [employer]) Smooth  L Finger to Nose (Point to [employer]) Ataxia  RUE Motor Strength 5  LUE Motor Strength 5  RLE Motor Strength 5  LLE Motor Strength 5  Glasgow Coma Scale  Eye Opening 4  Best Verbal Response (NON-intubated) 4  Best Motor Response 6  Glasgow Coma Scale Score 14  ECG Monitoring  ECG Heart Rate 77

## 2022-01-31 NOTE — Procedures (Signed)
Patient Name: Jeremiah [name]  MRN: [id number]  Epilepsy Attending: Lora Havens  Referring Physician/Provider: Lorenza Chick, MD  Date: 01/31/2022 Duration: 22.50 mins  Patient history: 81yo M with ams. EEG to evaluate for seizure  Level of alertness: Awake  AEDs during EEG study: None  Technical aspects: This EEG study was done with scalp electrodes positioned according to the 10-20 International system of electrode placement. Electrical activity was reviewed with band pass filter of 1-'70Hz'$ , sensitivity of 7 uV/mm, display speed of 46m/sec with a '60Hz'$  notched filter applied as appropriate. EEG data were recorded continuously and digitally stored.  Video monitoring was available and reviewed as appropriate.  Description: No posterior dominant rhythm was seen. EEG showed continuous generalized 3 to 6 Hz theta-delta slowing. Generalized periodic discharges with triphasic morphology at 1-2 Hz were also noted at times.  Hyperventilation and photic stimulation were not performed.     ABNORMALITY - Periodic discharges with triphasic morphology, generalized ( GPDs) - Continuous slow, generalized  IMPRESSION: This study  showed generalized periodic discharges with triphasic morphology which can be on the ictal-interictal continuum. However, toxic metabolic causes like cefepime toxicity can have lead to similar EEG pattern. Additionally there is  moderate diffuse encephalopathy, nonspecific etiology. No seizures were seen throughout the recording.  Johnye Kist OBarbra Sarks

## 2022-01-31 NOTE — Progress Notes (Signed)
PT Cancellation Note  Patient Details Name: Jeremiah [name] MRN: [id number] DOB: 06-15-1940   Cancelled Treatment:    Reason Eval/Treat Not Completed: Medical issues which prohibited therapy (code stroke called). Will check on pt tomorrow.   Leighton Roach, PT  Acute Rehab Services Secure chat preferred Office [place] 01/31/2022, 12:49 PM

## 2022-02-01 ENCOUNTER — Inpatient Hospital Stay (HOSPITAL_COMMUNITY): Payer: Medicare Other

## 2022-02-01 DIAGNOSIS — R9401 Abnormal electroencephalogram [EEG]: Secondary | ICD-10-CM | POA: Diagnosis not present

## 2022-02-01 DIAGNOSIS — G929 Unspecified toxic encephalopathy: Secondary | ICD-10-CM

## 2022-02-01 DIAGNOSIS — R4182 Altered mental status, unspecified: Secondary | ICD-10-CM | POA: Diagnosis not present

## 2022-02-01 DIAGNOSIS — T361X5A Adverse effect of cephalosporins and other beta-lactam antibiotics, initial encounter: Secondary | ICD-10-CM | POA: Diagnosis not present

## 2022-02-01 DIAGNOSIS — J9601 Acute respiratory failure with hypoxia: Secondary | ICD-10-CM | POA: Diagnosis not present

## 2022-02-01 DIAGNOSIS — R410 Disorientation, unspecified: Secondary | ICD-10-CM | POA: Diagnosis not present

## 2022-02-01 LAB — COMPREHENSIVE METABOLIC PANEL
ALT: 10 U/L (ref 0–44)
AST: 27 U/L (ref 15–41)
Albumin: 3.1 g/dL — ABNORMAL LOW (ref 3.5–5.0)
Alkaline Phosphatase: 45 U/L (ref 38–126)
Anion gap: 7 (ref 5–15)
BUN: 28 mg/dL — ABNORMAL HIGH (ref 8–23)
CO2: 34 mmol/L — ABNORMAL HIGH (ref 22–32)
Calcium: 9.1 mg/dL (ref 8.9–10.3)
Chloride: 104 mmol/L (ref 98–111)
Creatinine, Ser: 1.28 mg/dL — ABNORMAL HIGH (ref 0.61–1.24)
GFR, Estimated: 56 mL/min — ABNORMAL LOW (ref >60)
Glucose, Bld: 115 mg/dL — ABNORMAL HIGH (ref 70–99)
Potassium: 4.2 mmol/L (ref 3.5–5.1)
Sodium: 145 mmol/L (ref 135–145)
Total Bilirubin: 1.6 mg/dL — ABNORMAL HIGH (ref 0.3–1.2)
Total Protein: 6 g/dL — ABNORMAL LOW (ref 6.5–8.1)

## 2022-02-01 LAB — CBC
HCT: 44.8 % (ref 39.0–52.0)
Hemoglobin: 14.4 g/dL (ref 13.0–17.0)
MCH: 31.9 pg (ref 26.0–34.0)
MCHC: 32.1 g/dL (ref 30.0–36.0)
MCV: 99.1 fL (ref 80.0–100.0)
Platelets: 179 10*3/uL (ref 150–400)
RBC: 4.52 MIL/uL (ref 4.22–5.81)
RDW: 13.4 % (ref 11.5–15.5)
WBC: 7 10*3/uL (ref 4.0–10.5)
nRBC: 0 % (ref 0.0–0.2)

## 2022-02-01 LAB — MAGNESIUM: Magnesium: 1.9 mg/dL (ref 1.7–2.4)

## 2022-02-01 LAB — GLUCOSE, CAPILLARY
Glucose-Capillary: 119 mg/dL — ABNORMAL HIGH (ref 70–99)
Glucose-Capillary: 157 mg/dL — ABNORMAL HIGH (ref 70–99)
Glucose-Capillary: 197 mg/dL — ABNORMAL HIGH (ref 70–99)
Glucose-Capillary: 140 mg/dL — ABNORMAL HIGH (ref 70–99)

## 2022-02-01 NOTE — Progress Notes (Signed)
Pt doesn't want to wear CPAP for the night. 

## 2022-02-01 NOTE — Progress Notes (Signed)
Patient removed all EEG leads, has mittens on now, per NeuroHospitalist Leads will need to be reapplied.

## 2022-02-01 NOTE — Progress Notes (Signed)
Occupational Therapy Re-evaluation  Patient Details Name: Jeremiah [name] MRN: [id number] DOB: [birth year] Today's Date: 02/01/2022   History of present illness 81 y.o. male presents to [facility] hospital on 01/27/2022 with SOB and generalized weakness. Pt recently admitted with NSTEMI, underwent PCI to stenting on 01/24/2022, discharged 9/6. Pt now admitted for management of HCAP. Pt with code stroke activation 9/12 after onset of global aphasia. MRI with no acute findings. PMH: BPH, DM, HTN, OSA on CPAP, AAA, prostate CA s/p raditation, NPH s/p VP shunt   OT comments  Pt with code stroke activation 01/31/2022 (MIR with no acute findings), so performing re-eval this session. Upon re-eval, pt with mild expressive/word finding difficulties and decreased cognition. Pt with decreased orientation, safety, problem solving, and memory. Pt performing LB ADL with supervision-min A and UB ADL with min A for problem solving. Pt performing functional mobility with min guard and intermittent min A for balance. Updating discharge recommendation to [facility] pending available assistance at discharge.    Recommendations for follow up therapy are one component of a multi-disciplinary discharge planning process, led by the attending physician.  Recommendations may be updated based on patient status, additional functional criteria and insurance authorization.    Follow Up Recommendations  Home health OT    Assistance Recommended at Discharge None  Patient can return home with the following  Assist for transportation;A little help with walking and/or transfers;A little help with bathing/dressing/bathroom;Assistance with cooking/housework;Direct supervision/assist for medications management;Direct supervision/assist for financial management;Help with stairs or ramp for entrance   Equipment Recommendations  Other (comment) (Will continue to assess)    Recommendations for Other Services      Precautions / Restrictions  Precautions Precautions: None Precaution Comments: Monitor SpO2 Restrictions Weight Bearing Restrictions: No       Mobility Bed Mobility Overal bed mobility: Modified Independent             General bed mobility comments: no assistance to get to EOB    Transfers Overall transfer level: Independent Equipment used: 1 person hand held assist Transfers: Sit to/from Stand Sit to Stand: Min guard, Min assist           General transfer comment: Min guard up from EOB; up to min A to steady and during ambulation     Balance Overall balance assessment: Needs assistance Sitting-balance support: Feet supported, No upper extremity supported Sitting balance-Leahy Scale: Good     Standing balance support: No upper extremity supported, During functional activity Standing balance-Leahy Scale: Poor Standing balance comment: Hand held assist and intermittent min A                           ADL either performed or assessed with clinical judgement   ADL Overall ADL's : Needs assistance/impaired     Grooming: Min guard;Standing (cleaning glasses)           Upper Body Dressing : Minimal assistance Upper Body Dressing Details (indicate cue type and reason): min A for problem solving to don gown/manage O2 lines Lower Body Dressing: Supervision/safety;Sitting/lateral leans Lower Body Dressing Details (indicate cue type and reason): donning socks EOB Toilet Transfer: Min guard;Minimal assistance;Comfort height toilet Toilet Transfer Details (indicate cue type and reason): hand held assist         Functional mobility during ADLs: Min guard;Minimal assistance (one person hand held assist)      Extremity/Trunk Assessment Upper Extremity Assessment Upper Extremity Assessment: Overall WFL for tasks assessed (Per  chart, pt had reported decreased LUE sensation with code stroke activation, but denying differences in sensation this eval)   Lower Extremity  Assessment Lower Extremity Assessment: Defer to PT evaluation        Vision   Vision Assessment?: No apparent visual deficits   Perception Perception Perception: Within Functional Limits   Praxis Praxis Praxis: Intact    Cognition Arousal/Alertness: Awake/alert Behavior During Therapy: WFL for tasks assessed/performed Overall Cognitive Status: Impaired/Different from baseline Area of Impairment: Orientation, Attention, Following commands, Memory, Safety/judgement, Awareness, Problem solving                 Orientation Level: Time, Disoriented to Current Attention Level: Sustained Memory: Decreased short-term memory Following Commands: Follows one step commands consistently, Follows one step commands with increased time, Follows multi-step commands inconsistently Safety/Judgement: Decreased awareness of safety, Decreased awareness of deficits Awareness: Emergent Problem Solving: Slow processing, Requires verbal cues General Comments: Pt unsure why he is here, and with max difficulty stating year, month, day. Pt with greater skill given multiple choice options at end of session. Pt unable to count backward from 20-1 indicating decreased problem solving and requiring significantly increased time to begin task. Pt requiring intermittent verbal cues for following directions throughout. Able to path find back to room after loop around nurses station. Pt additionally walking to bed to get blanket after offered to wash glasses with waater. Pt with intermittent confusion        Exercises      Shoulder Instructions       General Comments      Pertinent Vitals/ Pain       Pain Assessment Pain Assessment: No/denies pain  Home Living                                          Prior Functioning/Environment              Frequency  Min 2X/week        Progress Toward Goals  OT Goals(current goals can now be found in the care plan section)   Progress towards OT goals: Progressing toward goals  Acute Rehab OT Goals Patient Stated Goal: go home OT Goal Formulation: With patient  Plan Discharge plan needs to be updated    Co-evaluation                 AM-PAC OT "6 Clicks" Daily Activity     Outcome Measure   Help from another person eating meals?: None Help from another person taking care of personal grooming?: A Little Help from another person toileting, which includes using toliet, bedpan, or urinal?: A Little Help from another person bathing (including washing, rinsing, drying)?: A Little Help from another person to put on and taking off regular upper body clothing?: A Little Help from another person to put on and taking off regular lower body clothing?: A Little 6 Click Score: 19    End of Session Equipment Utilized During Treatment: Gait belt;Oxygen  OT Visit Diagnosis: Unsteadiness on feet (R26.81);Other symptoms and signs involving cognitive function   Activity Tolerance Patient tolerated treatment well   Patient Left in chair;with call bell/phone within reach;with chair alarm set   Nurse Communication Mobility status        Time: 1884-1660 OT Time Calculation (min): 27 min  Charges: OT General Charges $OT Visit: 1 Visit OT Evaluation $OT Re-eval: 1  Re-eval  Shanda Howells, OTR/L [facility] Acute Rehabilitation Office: [phone]   Jeremiah [name] 02/01/2022, 1:45 PM

## 2022-02-01 NOTE — Progress Notes (Signed)
  Progress Note   Patient: Jeremiah [name] AQT:[id number] DOB: June 13, 1940 DOA: 01/27/2022     5 DOS: the patient was seen and examined on 02/01/2022   Brief hospital course:  Assessment and Plan: Possible pneumonia --Initially on cefepime and IV vancomycin. MRSA neg, thus vanc stopped. Procal <0.1.  Antibiotics were stopped.    Acute on chronic hypoxic respiratory failure secondary to above --On 2 L nasal cannula oxygen supplementation at baseline. O2 requirements up to 4L initially, now on room air  Toxic metabolic encephalopathy --Sudden onset confusion 9/12, seen by neurology for code stroke, CT head and MRI brain no stroke.  EEG unrevealing.  Patient much improved after discontinuation of cefepime.  Discussed with neurology, felt to be secondary to cefepime.  Neurology has signed off.  No further recommendations. -- Clinically much improved today.   Generalized weakness --PT OT consulted with no therapy recs identified   Recent NSTEMI, status post PCI with stent placement Coronary artery disease --S/p LHC and DES stent to RPDA on 9/5. --Cont home DAPT --Continue home Zetia and Crestor   Chronic diastolic CHF --Continue home cardiac medications   Type 2 diabetes with hyperglycemia --A1c 7.9 on 01/21/2022. --SSI        Subjective:  Feels ok  Eating ok Doesn't remember yesterday  Physical Exam: Vitals:   02/01/22 0624 02/01/22 0737 02/01/22 1301 02/01/22 1602  BP: (!) 156/73 (!) 118/52 127/74 130/68  Pulse: 78 (!) 52 83 78  Resp: 16 18    Temp: 98.1 F (36.7 C) 97.9 F (36.6 C)    TempSrc: Axillary     SpO2: 94%  96% 93%  Weight:      Height:       Physical Exam Vitals reviewed.  Constitutional:      General: He is not in acute distress.    Appearance: He is not ill-appearing or toxic-appearing.  Cardiovascular:     Rate and Rhythm: Normal rate and regular rhythm.     Heart sounds: No murmur heard. Neurological:     Mental Status: He is alert.      Data Reviewed:  Creatinine appears stable 1.28 MR brain no acute findings  Family Communication:   Disposition: Status is: Inpatient Remains inpatient appropriate because: monitoring of mental status  Planned Discharge Destination: Home with Home Health    Time spent: 20 minutes  Author: Murray Hodgkins, MD 02/01/2022 7:07 PM  For on call review [email or web address].

## 2022-02-01 NOTE — Progress Notes (Signed)
Rehooked eeg with MR leads. [facility] monitored, Event button test confirmed by [facility].

## 2022-02-01 NOTE — Progress Notes (Signed)
Suddenly pt sit on bed on pulled out all the leads of EEG ,monitor, secured message sent to EEG on call

## 2022-02-01 NOTE — Progress Notes (Signed)
Neurology Progress Note  Brief HPI: Jeremiah [name] is a 81 y.o. male with medical history significant for normal pressure hydrocephalous post VP shunt, type 2 diabetes, chronic hypoxic respiratory failure on 2 L nasal cannula, hypertension, hyperlipidemia, recently admitted for NSTEMI, coronary artery disease status post PCI with stenting on 01/24/2022 on DAPT (discharges 9/6), chronic diastolic CHF, who presented to [facility] ED from home with complaints of shortness of breath and generalized weakness on 9/8.  He was found to have bilateral lower lobe infiltrates and started on cefepime and vancomycin for HCAP, narrowed to cefepime alone.  He has also been receiving Lasix for diuresis. On 9/12 at  7 AM he was noted to be disoriented but otherwise speaking appropriately.  He was subsequently put back on CPAP and discovered to be more confused with concern for aphasia due to repetitive statements and inability to identify all objects/follow all commands for which stroke code stroke was activated. Of note he is the primary caregiver for his wife who has dementia.   Subjective: Tearful on exam, does not have any recollection of coming to the hospital. No focal neuro deficits. Fully oriented today but does not remember coming to the hospital.  Exam: Vitals:   02/01/22 0624 02/01/22 0737  BP: (!) 156/73 (!) 118/52  Pulse: 78 (!) 52  Resp: 16 18  Temp: 98.1 F (36.7 C) 97.9 F (36.6 C)  SpO2: 94%    Gen: In bed, NAD Resp: non-labored breathing, no acute distress. [place] in place at  2L Abd: soft, nt  Neuro: Mental Status: Patient is awake, alert, able to state name and year, states he does not remember coming to the hospital.  He is able to tell me his wife's name and that he lives with her Cranial Nerves: II: Visual Fields are full to blink to threat. Pupils are equal, round, and reactive to light.   III,IV, VI: EOMI to orienting to examiner's voice V: Facial sensation is symmetric to light eyelash  brush VII: Facial movement is symmetric.  VIII: hearing is intact to voice X: Uvula elevates symmetrically XI: Shoulder shrug is symmetric. XII: tongue is midline without atrophy or fasciculations.  Motor: Mild pronation without drift of the bilateral upper extremities.  Able to maintain both legs without drift in the bilateral lower extremities Sensory: Appears grossly equally reactive to touch in all 4 extremities Cerebellar: FNF intact bilaterally, heel-to-shin mildly ataxic bilaterally likely secondary to his chronic NPH Gait:  Deferred     Pertinent Labs: 9/12- Arterial CO2- 66, Arterial O2- 75 BUN 28 Cr 1.42  Imaging Reviewed:  CT Head/CTA Head and Neck with Perfusion-  No hemorrhage or CT evidence of acute infarct. No intracranial large vessel occlusion or hemodynamically significant stenosis. Re demonstrated bilateral subdural fluid collections which appear unchanged compared to 08/12/2021, and may be related to CSF shunting.  MRI No acute or reversible finding. VP shunt is seen previously.  Stable ventricular size. Chronic small-vessel ischemic changes affecting the pons and cerebral hemispheric white matter. Stable bilateral low-density subdural hygromas.  cEEG 01/31/2022 1519 to 02/01/2022 0036, 0422 to 0829 This study is suggestive of moderate diffuse encephalopathy, nonspecific etiology. No seizures or definite epileptiform discharges were seen throughout the recording.  EEG 01/31/2022 This study  showed generalized periodic discharges with triphasic morphology which can be on the ictal-interictal continuum. However, toxic metabolic causes like cefepime toxicity can have lead to similar EEG pattern. Additionally there is  moderate diffuse encephalopathy, nonspecific etiology. No seizures were seen  throughout the recording.  Assessment:  81 y.o. male with medical history significant for normal pressure hydrocephalous post VP shunt, type 2 diabetes, chronic hypoxic  respiratory failure on 2 L nasal cannula, hypertension, hyperlipidemia, recently admitted for NSTEMI, coronary artery disease status post PCI with stenting on 01/24/2022 on DAPT (discharges 9/6), chronic diastolic CHF, who presented to [facility] ED from home with complaints of shortness of breath and generalized weakness on 9/8.  9/12 code stroke was called due to confusion and aphasia.  CT head, CTA head and neck, MRI all negative for any acute infarct or other new findings.  Initial EEG did show triphasic morphology possibly linked to cefepime which has since been discontinued.  Continuous EEG overnight last night has shown moderate diffuse encephalopathy without epileptiform abnl.  Recommendations: -Delirium precautions -Discontinue continuous EEG   Patient seen and examined by NP/APP with MD. MD to update note as needed.   Janine Ores, DNP, FNP-BC [facility] Pager: [phone]  Attending Neurologist's note:   I personally saw this patient, gathering history, performing a full neurologic examination, reviewing relevant labs, personally reviewing relevant imaging, and formulated the assessment and plan, adding the note above for completeness and clarity to accurately reflect my findings and recommendations. Given the significant improvement in his mental status and EEG after stopping cefepime, suspect AMS 2/2 cefepime toxicity and recommend no further workup at this time. Neurology to sign off, but please re-engage if additional neurologic concerns arise.  Su Monks, MD [facility] [phone]  If 7pm- 7am, please page neurology on call as listed in [place].

## 2022-02-01 NOTE — TOC Progression Note (Signed)
Transition of Care [facility]) - Progression Note    Patient Details  Name: Jeremiah [name] MRN: [id number] Date of Birth: 25-Jul-1940  Transition of Care [facility]) CM/SW [place], [place] Phone Number: 02/01/2022, 11:06 AM  Clinical Narrative:     CSW received call back from pt's daughter. Daughter reports that pt lives at home with his wife and has been her care taker. Family feels pt is beginning to need assistance and unable to live independently. Daughter indicates interest and possible LTC placement or rehab. CSW explains PT did not recommend short term rehab at [facility]. Daughter expresses concern about this recommendation stating that when she has seen the pt at the hospital he was far from baseline and struggling quite a bit with mobility. She states he was ambulating independently in the home prior to this admission. CSW explains that SNF for rehab would depend on PT's ongoing assessment.   CSW discusses LTC options with Daughter and explains LTC would be private pay as medicare does not cover LTC. CSW explains options of LTC SNF vs ALF vs Care givers at home. Answers daughters questions. CSW emails daughter contacts for [facility] and [facility] to assist with LTC placement. TOC will continue to follow for potential DC needs.        Expected Discharge Plan and Services                                                 Social Determinants of Health (SDOH) Interventions Housing Interventions: Intervention Not Indicated  Readmission Risk Interventions     No data to display

## 2022-02-01 NOTE — Progress Notes (Signed)
SLP Cancellation Note  Patient Details Name: Jeremiah [name] MRN: [id number] DOB: May 30, 1940   Cancelled treatment:       Reason Eval/Treat Not Completed: Patient at procedure or test/unavailable (Pt off unit for MRI. SLP will follow up later.)  Arienne Gartin I. Hardin Negus, [place], [place] Office number [phone]  Horton Marshall 02/01/2022, 9:05 AM

## 2022-02-01 NOTE — Progress Notes (Signed)
EEG D/C'd. No skin breakdown. [facility] was notified.  

## 2022-02-01 NOTE — Procedures (Addendum)
Patient Name: Jeremiah [name]  MRN: [id number]  Epilepsy Attending: Lora Havens  Referring Physician/Provider: Lorenza Chick, MD  Duration: 01/31/2022 1519 to 02/01/2022 0036, 0422 to 4008   Patient history: 81yo M with ams. EEG to evaluate for seizure   Level of alertness: Awake   AEDs during EEG study: None   Technical aspects: This EEG study was done with scalp electrodes positioned according to the 10-20 International system of electrode placement. Electrical activity was reviewed with band pass filter of 1-'70Hz'$ , sensitivity of 7 uV/mm, display speed of 48m/sec with a '60Hz'$  notched filter applied as appropriate. EEG data were recorded continuously and digitally stored.  Video monitoring was available and reviewed as appropriate.   Description: No posterior dominant rhythm was seen. EEG showed continuous generalized 3 to 6 Hz theta-delta slowing. Hyperventilation and photic stimulation were not performed.     Parts of study were missing between 0036 to 0422 as patient pulled his electrodes.   ABNORMALITY - Continuous slow, generalized   IMPRESSION: This study is suggestive of moderate diffuse encephalopathy, nonspecific etiology. No seizures or definite epileptiform discharges were seen throughout the recording.   Abrian Hanover OBarbra Sarks

## 2022-02-01 NOTE — Progress Notes (Signed)
Physical Therapy RE-EVALUATION Patient Details Name: Jeremiah [name] MRN: [id number] DOB: Feb 16, 1941 Today's Date: 02/01/2022   History of Present Illness 81 y.o. male presents to [facility] hospital on 01/27/2022 with SOB and generalized weakness. Pt recently admitted with NSTEMI, underwent PCI to stenting on 01/24/2022, discharged 9/6. Pt now admitted for management of HCAP. Pt with code stroke activation 9/12 after onset of global aphasia. MRI with no acute findings. PMH: BPH, DM, HTN, OSA on CPAP, AAA, prostate CA s/p raditation, NPH s/p VP shunt    PT Comments    Pt with decline in function and cognition. Code stroke called on pt 9/12. Pt was amb 300' without AD and supervision on 9/9. Pt now confused with impaired processing, sequencing, and decreased insight to deficits. Pt unaware of why he is in the hospital. Pt with impaired balance. Suspect once confusion clears up pt will progress well. Acute PT to cont to follow to progress mobility and re-assess d/c recommendations.    Recommendations for follow up therapy are one component of a multi-disciplinary discharge planning process, led by the attending physician.  Recommendations may be updated based on patient status, additional functional criteria and insurance authorization.  Follow Up Recommendations  Home health PT (pending progress and assist at home)     Assistance Recommended at Discharge Frequent or constant Supervision/Assistance  Patient can return home with the following Assistance with cooking/housework   Equipment Recommendations  None recommended by PT    Recommendations for Other Services       Precautions / Restrictions Precautions Precautions: None Precaution Comments: Monitor SpO2 Restrictions Weight Bearing Restrictions: No     Mobility  Bed Mobility Overal bed mobility: Modified Independent             General bed mobility comments: no assistance to get to EOB    Transfers Overall transfer level: Needs  assistance Equipment used: 1 person hand held assist Transfers: Sit to/from Stand Sit to Stand: Min guard           General transfer comment: Min guard up from EOB    Ambulation/Gait Ambulation/Gait assistance: Min guard Gait Distance (Feet): 150 Feet Assistive device: None Gait Pattern/deviations: Step-through pattern Gait velocity: functional Gait velocity interpretation: <1.31 ft/sec, indicative of household ambulator   General Gait Details: mild lateral sway with decreased step height and length however no overt LOB   Stairs             Wheelchair Mobility    Modified Rankin (Stroke Patients Only) Modified Rankin (Stroke Patients Only) Pre-Morbid Rankin Score: No significant disability Modified Rankin: Moderate disability     Balance Overall balance assessment: Needs assistance Sitting-balance support: Feet supported, No upper extremity supported Sitting balance-Leahy Scale: Good     Standing balance support: No upper extremity supported, During functional activity Standing balance-Leahy Scale: Fair Standing balance comment: Hand held assist and intermittent min A                            Cognition Arousal/Alertness: Awake/alert Behavior During Therapy: WFL for tasks assessed/performed Overall Cognitive Status: Impaired/Different from baseline Area of Impairment: Orientation, Attention, Following commands, Memory, Safety/judgement, Awareness, Problem solving                 Orientation Level: Time, Disoriented to (unable to state date, stated february when given choices, pt with no recall of date at end of session) Current Attention Level: Sustained Memory: Decreased short-term memory (unable  to recall why he is in the hopsital or date despite being reoriented) Following Commands: Follows one step commands consistently, Follows one step commands with increased time, Follows multi-step commands inconsistently Safety/Judgement:  Decreased awareness of safety, Decreased awareness of deficits Awareness: Emergent Problem Solving: Slow processing, Requires verbal cues General Comments: Pt unsure why he is here, and with max difficulty stating year, month, day. Pt able to navigate back to his room when ambulating however unable to sequence washing his glasses, pt went to the bed to wash his glasses. Pt with poor short term memory        Exercises      General Comments General comments (skin integrity, edema, etc.): VSS, pt stays on 2LO2 via [place]      Pertinent Vitals/Pain Pain Assessment Pain Assessment: No/denies pain    Home Living                          Prior Function            PT Goals (current goals can now be found in the care plan section) Acute Rehab PT Goals Patient Stated Goal: to go home PT Goal Formulation: With patient Time For Goal Achievement: 02/11/22 Potential to Achieve Goals: Good Progress towards PT goals: Not progressing toward goals - comment (pt with a set back, code stroke called however imaging negative)    Frequency    Min 3X/week      PT Plan Discharge plan needs to be updated;Frequency needs to be updated    Co-evaluation PT/OT/SLP Co-Evaluation/Treatment: Yes            AM-PAC PT "6 Clicks" Mobility   Outcome Measure  Help needed turning from your back to your side while in a flat bed without using bedrails?: None Help needed moving from lying on your back to sitting on the side of a flat bed without using bedrails?: A Little Help needed moving to and from a bed to a chair (including a wheelchair)?: A Little Help needed standing up from a chair using your arms (e.g., wheelchair or bedside chair)?: A Little Help needed to walk in hospital room?: A Little Help needed climbing 3-5 steps with a railing? : A Little 6 Click Score: 19    End of Session Equipment Utilized During Treatment: Oxygen Activity Tolerance: Patient tolerated treatment  well Patient left: in chair;with call bell/phone within reach;with chair alarm set Nurse Communication: Mobility status PT Visit Diagnosis: Other abnormalities of gait and mobility (R26.89);Difficulty in walking, not elsewhere classified (R26.2)     Time: 6789-3810 PT Time Calculation (min) (ACUTE ONLY): 27 min  Charges:                        Kittie Plater, PT, DPT Acute Rehabilitation Services Secure chat preferred Office #: [phone]    Berline Lopes 02/01/2022, 1:52 PM

## 2022-02-01 NOTE — Plan of Care (Signed)
Problem: Education: Goal: Understanding of CV disease, CV risk reduction, and recovery process will improve Outcome: Progressing Goal: Individualized Educational Video(s) Outcome: Progressing   Problem: Activity: Goal: Ability to return to baseline activity level will improve Outcome: Progressing   Problem: Cardiovascular: Goal: Ability to achieve and maintain adequate cardiovascular perfusion will improve Outcome: Progressing Goal: Vascular access site(s) Level 0-1 will be maintained Outcome: Progressing   Problem: Health Behavior/Discharge Planning: Goal: Ability to safely manage health-related needs after discharge will improve Outcome: Progressing   Problem: Education: Goal: Ability to describe self-care measures that may prevent or decrease complications (Diabetes Survival Skills Education) will improve Outcome: Progressing Goal: Individualized Educational Video(s) Outcome: Progressing   Problem: Coping: Goal: Ability to adjust to condition or change in health will improve Outcome: Progressing   Problem: Fluid Volume: Goal: Ability to maintain a balanced intake and output will improve Outcome: Progressing   Problem: Health Behavior/Discharge Planning: Goal: Ability to identify and utilize available resources and services will improve Outcome: Progressing Goal: Ability to manage health-related needs will improve Outcome: Progressing   Problem: Metabolic: Goal: Ability to maintain appropriate glucose levels will improve Outcome: Progressing   Problem: Nutritional: Goal: Maintenance of adequate nutrition will improve Outcome: Progressing Goal: Progress toward achieving an optimal weight will improve Outcome: Progressing   Problem: Skin Integrity: Goal: Risk for impaired skin integrity will decrease Outcome: Progressing   Problem: Tissue Perfusion: Goal: Adequacy of tissue perfusion will improve Outcome: Progressing   Problem: Education: Goal: Knowledge  of General Education information will improve Description: Including pain rating scale, medication(s)/side effects and non-pharmacologic comfort measures Outcome: Progressing   Problem: Health Behavior/Discharge Planning: Goal: Ability to manage health-related needs will improve Outcome: Progressing   Problem: Clinical Measurements: Goal: Ability to maintain clinical measurements within normal limits will improve Outcome: Progressing Goal: Will remain free from infection Outcome: Progressing Goal: Diagnostic test results will improve Outcome: Progressing Goal: Respiratory complications will improve Outcome: Progressing Goal: Cardiovascular complication will be avoided Outcome: Progressing   Problem: Activity: Goal: Risk for activity intolerance will decrease Outcome: Progressing   Problem: Nutrition: Goal: Adequate nutrition will be maintained Outcome: Progressing   Problem: Coping: Goal: Level of anxiety will decrease Outcome: Progressing   Problem: Elimination: Goal: Will not experience complications related to bowel motility Outcome: Progressing Goal: Will not experience complications related to urinary retention Outcome: Progressing   Problem: Pain Managment: Goal: General experience of comfort will improve Outcome: Progressing   Problem: Safety: Goal: Ability to remain free from injury will improve Outcome: Progressing   Problem: Skin Integrity: Goal: Risk for impaired skin integrity will decrease Outcome: Progressing   Problem: Education: Goal: Knowledge of General Education information will improve Description: Including pain rating scale, medication(s)/side effects and non-pharmacologic comfort measures Outcome: Progressing   Problem: Health Behavior/Discharge Planning: Goal: Ability to manage health-related needs will improve Outcome: Progressing   Problem: Clinical Measurements: Goal: Ability to maintain clinical measurements within normal limits  will improve Outcome: Progressing Goal: Will remain free from infection Outcome: Progressing Goal: Diagnostic test results will improve Outcome: Progressing Goal: Respiratory complications will improve Outcome: Progressing Goal: Cardiovascular complication will be avoided Outcome: Progressing   Problem: Activity: Goal: Risk for activity intolerance will decrease Outcome: Progressing   Problem: Nutrition: Goal: Adequate nutrition will be maintained Outcome: Progressing   Problem: Elimination: Goal: Will not experience complications related to bowel motility Outcome: Progressing Goal: Will not experience complications related to urinary retention Outcome: Progressing   Problem: Pain Managment: Goal: General experience of comfort  will improve Outcome: Progressing   Problem: Safety: Goal: Ability to remain free from injury will improve Outcome: Progressing

## 2022-02-01 NOTE — Evaluation (Signed)
Clinical/Bedside Swallow Evaluation Patient Details  Name: Jeremiah [name] MRN: [id number] Date of Birth: 07/27/1940  Today's Date: 02/01/2022 Time: SLP Start Time (ACUTE ONLY): 4315 SLP Stop Time (ACUTE ONLY): 1218 SLP Time Calculation (min) (ACUTE ONLY): 14 min  Past Medical History:  Past Medical History:  Diagnosis Date   BPH (benign prostatic hyperplasia)    Central retinal artery occlusion    Diabetes mellitus without complication ([place])    diet controlled   Diverticulosis    History of kidney stones    Hyperlipidemia    Hypertension    OSA on CPAP    wears cpap   Osteoarthritis    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, 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   S/P radiation therapy 04/23/13 - 05/29/14   Prostate/seminal vesicles, external beam 4500 cGy in 25 sessions   Seasonal allergies    Skin cancer    scalp   Small bowel obstruction ([place]) 06/10/2016   Wears glasses    Past Surgical History:  Past Surgical History:  Procedure Laterality Date   BACK SURGERY  2009   COLONOSCOPY W/ BIOPSIES AND POLYPECTOMY     CORONARY STENT INTERVENTION N/A 01/24/2022   Procedure: CORONARY STENT INTERVENTION;  Surgeon: [name], [name], MD;  Location: [facility];  Service: Cardiovascular;  Laterality: N/A;   CYSTOSCOPY W/ URETERAL STENT PLACEMENT Left 05/12/2021   Procedure: CYSTOSCOPY WITH RETROGRADE PYELOGRAM/ LEFT URETERAL STENT PLACEMENT.;  Surgeon: [name], MD;  Location: [facility];  Service: Urology;  Laterality: Left;   [place]   LEFT HEART CATH AND CORONARY ANGIOGRAPHY N/A 03/14/2019   Procedure: LEFT HEART CATH AND CORONARY ANGIOGRAPHY;  Surgeon: [name], MD;  Location: [facility];  Service: Cardiovascular;  Laterality: N/A;   LEFT HEART CATH AND CORONARY ANGIOGRAPHY N/A 01/24/2022   Procedure: LEFT HEART CATH AND CORONARY ANGIOGRAPHY;  Surgeon: [name], [name], MD;  Location: [facility];  Service: Cardiovascular;  Laterality: N/A;   PROSTATE  BIOPSY  2013, 2014, 01/2014   Gleason 7   RADIOACTIVE SEED IMPLANT N/A 07/01/2014   Procedure: RADIOACTIVE SEED IMPLANT;  Surgeon: [name], MD;  Location: [facility];  Service: Urology;  Laterality: N/A;  DR PORTABLE   TONSILLECTOMY     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   Procedure: SHUNT INSERTION VENTRICULAR-PERITONEAL;  Surgeon: [name], MD;  Location: [facility];  Service: Neurosurgery;  Laterality: N/A;  SHUNT INSERTION VENTRICULAR-PERITONEAL   HPI:  Pt is an 81 y.o. male who presented with complaints of shortness of breath and generalized weakness. MRI brain negative. [facility] swallow screen attempted, but pt stopped drinking. EEG 9/13: moderate diffuse encephalopathy. Dx HCAP, toxic metabolic encephalopathy. PMH: NPH post VP shunt, type 2 diabetes, chronic hypoxic respiratory failure on 2 L nasal cannula, hypertension, hyperlipidemia, recently admitted for NSTEMI, coronary artery disease status post PCI with stenting on 01/24/2022 on DAPT, chronic diastolic CHF    Assessment / Plan / Recommendation  Clinical Impression  Pt was seen for bedside swallow evaluation and he denied a history of dysphagia. Oral motor strength and ROM were WFL. Pt presented with adequate, natural dentition. He tolerated all solids and liquids without signs or symptoms of oropharyngeal dysphagia. A regular texture diet with thin liquids is recommended at this time. SLP will follow briefly to ensure tolerance. SLP Visit Diagnosis: Dysphagia, unspecified (R13.10)    Aspiration Risk       Diet Recommendation Regular;Thin liquid  Liquid Administration via: Cup;Straw Medication Administration: Whole meds with liquid Supervision: Patient able to self feed Compensations: Slow rate;Small sips/bites Postural Changes: Seated upright at 90 degrees    Other  Recommendations Oral Care Recommendations: Oral care BID    Recommendations for follow up therapy are one component of a  multi-disciplinary discharge planning process, led by the attending physician.  Recommendations may be updated based on patient status, additional functional criteria and insurance authorization.  Follow up Recommendations No SLP follow up      Assistance Recommended at Discharge    Functional Status Assessment Patient has not had a recent decline in their functional status  Frequency and Duration min 1 x/week  1 week       Prognosis Prognosis for Safe Diet Advancement: Good      Swallow Study   General Date of Onset: 01/31/22 HPI: Pt is an 81 y.o. male who presented with complaints of shortness of breath and generalized weakness. MRI brain negative. [facility] swallow screen attempted, but pt stopped drinking. EEG 9/13: moderate diffuse encephalopathy. Dx HCAP, toxic metabolic encephalopathy. PMH: NPH post VP shunt, type 2 diabetes, chronic hypoxic respiratory failure on 2 L nasal cannula, hypertension, hyperlipidemia, recently admitted for NSTEMI, coronary artery disease status post PCI with stenting on 01/24/2022 on DAPT, chronic diastolic CHF Type of Study: Bedside Swallow Evaluation Previous Swallow Assessment: none Diet Prior to this Study: NPO Temperature Spikes Noted: No Respiratory Status: Nasal cannula History of Recent Intubation: No Behavior/Cognition: Alert;Cooperative;Pleasant mood Oral Cavity Assessment: Within Functional Limits Oral Care Completed by SLP: No Oral Cavity - Dentition: Adequate natural dentition Vision: Functional for self-feeding Self-Feeding Abilities: Able to feed self Patient Positioning: Upright in chair;Postural control adequate for testing Baseline Vocal Quality: Normal Volitional Cough: Strong Volitional Swallow: Able to elicit    Oral/Motor/Sensory Function Overall Oral Motor/Sensory Function: Within functional limits   Ice Chips Ice chips: Within functional limits Presentation: Spoon   Thin Liquid Thin Liquid: Within functional  limits Presentation: Straw    Nectar Thick Nectar Thick Liquid: Not tested   Honey Thick Honey Thick Liquid: Not tested   Puree Puree: Within functional limits Presentation: Spoon   Solid     Solid: Within functional limits Presentation: Wilder I. Hardin Negus, [place], [place] Office number [phone]  Horton Marshall 02/01/2022,12:18 PM

## 2022-02-02 DIAGNOSIS — G929 Unspecified toxic encephalopathy: Secondary | ICD-10-CM

## 2022-02-02 DIAGNOSIS — J9601 Acute respiratory failure with hypoxia: Secondary | ICD-10-CM | POA: Diagnosis not present

## 2022-02-02 DIAGNOSIS — I5032 Chronic diastolic (congestive) heart failure: Secondary | ICD-10-CM

## 2022-02-02 LAB — GLUCOSE, CAPILLARY
Glucose-Capillary: 128 mg/dL — ABNORMAL HIGH (ref 70–99)
Glucose-Capillary: 249 mg/dL — ABNORMAL HIGH (ref 70–99)
Glucose-Capillary: 235 mg/dL — ABNORMAL HIGH (ref 70–99)

## 2022-02-02 NOTE — Progress Notes (Signed)
SATURATION QUALIFICATIONS: (This note is used to comply with regulatory documentation for home oxygen)  Patient Saturations on Room Air at Rest = 91%  Patient Saturations on Room Air while Ambulating = 86%  Patient Saturations on 1 Liters of oxygen while Ambulating = 94%  Please briefly explain why patient needs home oxygen:  Hypoxia with ambulation on room air.  Magda Kiel, PT Acute Rehabilitation Services Office:[phone] 02/02/2022

## 2022-02-02 NOTE — TOC Transition Note (Signed)
Transition of Care [facility]) - CM/SW Discharge Note   Patient Details  Name: Jeremiah [name] MRN: [id number] Date of Birth: Oct 07, 1940  Transition of [facility]) CM/SW Contact:  Pollie Friar, RN Phone Number: 02/02/2022, 2:20 PM   Clinical Narrative:    Patient is discharging home with home health services through [place]. Information on the AVS.  Pt lives at home with his spouse. They have a caregiver that comes in 2 days a week to assist with home needs. She also fills their medications boxes. Family is going to see if she can increase her hours to provide more support at home. Pt uses oxygen at home nightly PRN. Currently he is requiring continuous oxygen. CM called [employer] (who his oxygen is through) and they say his home oxygen is already ordered continuous. No new orders needed. [employer] to deliver a tank to the room for transport home. Pt is interested in a shoulder bag oxygen system. CM has placed order in Epic for this referral and updated [employer]..  Daughter is at bedside and will provide transportation home.    Final next level of care: Home w Home Health Services Barriers to Discharge: No Barriers Identified   Patient Goals and CMS Choice   CMS Medicare.gov Compare Post Acute Care list provided to:: Patient Choice offered to / list presented to : Patient, Adult Children  Discharge Placement                       Discharge Plan and Services                          HH Arranged: PT, OT, Nurse's Aide HH Agency: [place] Date [place]: 02/02/22   Representative spoke with at [place]: [place] ([place]) Interventions Housing Interventions: Intervention Not Indicated   Readmission Risk Interventions     No data to display

## 2022-02-02 NOTE — Progress Notes (Signed)
Physical Therapy Treatment Patient Details Name: Jeremiah [name] MRN: [id number] DOB: 06-07-1940 Today's Date: 02/02/2022   History of Present Illness 81 y.o. male presents to [facility] hospital on 01/27/2022 with SOB and generalized weakness. Pt recently admitted with NSTEMI, underwent PCI to stenting on 01/24/2022, discharged 9/6. Pt now admitted for management of HCAP. Pt with code stroke activation 9/12 after onset of global aphasia. MRI with no acute findings. PMH: BPH, DM, HTN, OSA on CPAP, AAA, prostate CA s/p raditation, NPH s/p VP shunt    PT Comments    Patient progressing with cognition per daughter who entered during session.  Able to ambulate further and without device and no LOB.  Still struggling with dates, but able to tell me about his caregiving for his wife with dementia.  Daughter reports they are helping right now with her mom and there is an aide that comes 2 x/week for 8 hours to assist in the home.  She is working on further assist as she realizes he will not be able to resume taking charge of his and his wife's care immediately.  Note SpO2 drop with ambulation.  Daughter states he was only on O2 at home PRN.  PT will continue to follow.   Recommendations for follow up therapy are one component of a multi-disciplinary discharge planning process, led by the attending physician.  Recommendations may be updated based on patient status, additional functional criteria and insurance authorization.  Follow Up Recommendations  Home health PT     Assistance Recommended at Discharge Frequent or constant Supervision/Assistance  Patient can return home with the following Assistance with cooking/housework;Assist for transportation;Direct supervision/assist for financial management;Help with stairs or ramp for entrance;Direct supervision/assist for medications management   Equipment Recommendations  None recommended by PT    Recommendations for Other Services       Precautions /  Restrictions Precautions Precautions: Fall Precaution Comments: Monitor SpO2     Mobility  Bed Mobility Overal bed mobility: Modified Independent                  Transfers Overall transfer level: Needs assistance Equipment used: None Transfers: Sit to/from Stand Sit to Stand: Min guard           General transfer comment: initial assist for balance    Ambulation/Gait Ambulation/Gait assistance: Min guard Gait Distance (Feet): 300 Feet Assistive device: None Gait Pattern/deviations: Step-through pattern, Decreased stride length       General Gait Details: no LOB, slower pace with decreased stride length, but daughter reports similar since NPH w/ shunt   Stairs             Wheelchair Mobility    Modified Rankin (Stroke Patients Only)       Balance Overall balance assessment: Needs assistance Sitting-balance support: Feet supported Sitting balance-Leahy Scale: Good Sitting balance - Comments: toilted in bathroom no assist for hygiene   Standing balance support: No upper extremity supported Standing balance-Leahy Scale: Good Standing balance comment: no UE support after initially pt placing hand on dynamap using to monitor SpO2                            Cognition Arousal/Alertness: Awake/alert Behavior During Therapy: WFL for tasks assessed/performed Overall Cognitive Status: Impaired/Different from baseline Area of Impairment: Orientation, Attention, Problem solving, Safety/judgement                 Orientation Level: Disoriented to, Time, Situation  Current Attention Level: Selective     Safety/Judgement: Decreased awareness of deficits   Problem Solving: Slow processing          Exercises      General Comments General comments (skin integrity, edema, etc.): on RA SpO2 at rest 91%, with ambulation 86%; replaced 1L O2 and SpO2 94% with ambulation      Pertinent Vitals/Pain Pain Assessment Pain Assessment:  No/denies pain    Home Living                          Prior Function            PT Goals (current goals can now be found in the care plan section) Progress towards PT goals: Progressing toward goals    Frequency    Min 3X/week      PT Plan Current plan remains appropriate    Co-evaluation              AM-PAC PT "6 Clicks" Mobility   Outcome Measure  Help needed turning from your back to your side while in a flat bed without using bedrails?: None Help needed moving from lying on your back to sitting on the side of a flat bed without using bedrails?: None Help needed moving to and from a bed to a chair (including a wheelchair)?: None Help needed standing up from a chair using your arms (e.g., wheelchair or bedside chair)?: None Help needed to walk in hospital room?: A Little Help needed climbing 3-5 steps with a railing? : Total 6 Click Score: 20    End of Session Equipment Utilized During Treatment: Gait belt;Oxygen Activity Tolerance: Patient tolerated treatment well Patient left: in chair;with call bell/phone within reach   PT Visit Diagnosis: Other abnormalities of gait and mobility (R26.89);Difficulty in walking, not elsewhere classified (R26.2)     Time: 4944-9675 PT Time Calculation (min) (ACUTE ONLY): 27 min  Charges:  $Gait Training: 8-22 mins $Therapeutic Activity: 8-22 mins                     Magda Kiel, PT Acute Rehabilitation Services Office:[phone] 02/02/2022    Reginia Naas 02/02/2022, 11:28 AM

## 2022-02-02 NOTE — Progress Notes (Signed)
Speech Language Pathology Treatment: Dysphagia  Patient Details Name: Jeremiah [name] MRN: [id number] DOB: 1941-05-18 Today's Date: 02/02/2022 Time: 8657-8469 SLP Time Calculation (min) (ACUTE ONLY): 15 min  Assessment / Plan / Recommendation Clinical Impression  Pt was seen during breakfast for dysphagia treatment and he was cooperative throughout the session. Pt, and nursing reported that the pt has been tolerating the current diet without overt s/s of aspiration. Pt's meal included pancakes, sausage, oatmeal, and thin liquids. Pt tolerated solids and thin liquids via cup and straw using individual and consecutive swallows without symptoms of oropharyngeal dysphagia. It is recommended that the current diet be continued. Further skilled SLP services are not clinically indicated at this time.    HPI HPI: Pt is an 81 y.o. male who presented with complaints of shortness of breath and generalized weakness. MRI brain negative. [facility] swallow screen attempted, but pt stopped drinking. EEG 9/13: moderate diffuse encephalopathy. Dx HCAP, toxic metabolic encephalopathy. PMH: NPH post VP shunt, type 2 diabetes, chronic hypoxic respiratory failure on 2 L nasal cannula, hypertension, hyperlipidemia, recently admitted for NSTEMI, coronary artery disease status post PCI with stenting on 01/24/2022 on DAPT, chronic diastolic CHF      SLP Plan  All goals met;Discharge SLP treatment due to (comment)      Recommendations for follow up therapy are one component of a multi-disciplinary discharge planning process, led by the attending physician.  Recommendations may be updated based on patient status, additional functional criteria and insurance authorization.    Recommendations  Diet recommendations: Regular;Thin liquid Liquids provided via: Cup;Straw Medication Administration: Whole meds with liquid Supervision: Patient able to self feed Compensations: Slow rate;Small sips/bites Postural Changes and/or Swallow  Maneuvers: Seated upright 90 degrees                Oral Care Recommendations: Oral care BID Follow Up Recommendations: No SLP follow up Assistance recommended at discharge: Set up Supervision/Assistance SLP Visit Diagnosis: Dysphagia, unspecified (R13.10) Plan: All goals met;Discharge SLP treatment due to (comment)          Talis Iwan I. Hardin Negus, [place], [place] Office number [place]  02/02/2022, 9:13 AM

## 2022-02-02 NOTE — Progress Notes (Signed)
Mobility Specialist: Progress Note   02/02/22 1207  Mobility  Activity Ambulated with assistance in hallway  Level of Assistance Contact guard assist, steadying assist  Assistive Device None  Distance Ambulated (ft) 260 ft  Activity Response Tolerated well  $Mobility charge 1 Mobility   Pre-Mobility: 80 HR, 92% SpO2 Post-Mobility: 95 HR, 95% SpO2  Received pt in bed having no complaints and agreeable to mobility.Ambulated on 1 L/min [place]. Pt was asymptomatic throughout ambulation and returned to room w/o fault. Left in bed w/ call bell in reach and all needs met.  [facility] Zen[place] Mobility Specialist Mobility Specialist [address]: [phone]

## 2022-02-02 NOTE — Discharge Summary (Addendum)
Physician Discharge Summary   Patient: Jeremiah [name] MRN: [id number] DOB: Aug 08, 1940  Admit date:     01/27/2022  Discharge date: 02/02/22  Discharge Physician: Murray Hodgkins   PCP: [name], MD   Recommendations at discharge:   Recovery from encephalopathy.  Ongoing care for recent NSTEMI.  Discharge Diagnoses: Principal Problem:   Acute on chronic respiratory failure with hypoxia (HCC) Active Problems:   DMII (diabetes mellitus, type 2) (HCC)   Generalized weakness   Toxic encephalopathy   Chronic diastolic CHF (congestive heart failure) (HCC)  Resolved Problems:   * No resolved hospital problems. [facility] Course: 81 year old man PMH NSTEMI discharged 9/6 presented 9/8 with shortness of breath and generalized weakness, noted to be tachypneic and mildly hypoxic.  Admitted for concern for pneumonia.  Was treated empirically for pneumonia but after further evaluation pneumonia was ruled out.  Acute on chronic hypoxic respiratory failure of unclear etiology based on imaging and clinical improvement.  Oxygen requirement at baseline and patient has home oxygen which she uses intermittently.  9/12 patient developed acute encephalopathy of unclear in etiology.  Code stroke was called and extensive investigation was unrevealing except for nonspecific abnormalities on EEG.  Cefepime was thought to be potential culprit and after discontinuation the patient rapidly improved.  Per neurology signs and symptoms most consistent with cefepime induced encephalopathy, neurology signed off with no further recommendations.  Patient's condition continued to improve and appears to be at baseline at this time.  Pneumonia considered on admission, ruled out --Initially on cefepime and IV vancomycin. MRSA neg, thus vanc stopped. Procal <0.1.  Antibiotics were stopped.    Acute on chronic hypoxic respiratory failure secondary to above --On 2 L nasal cannula oxygen supplementation at baseline. O2  requirements up to 4L initially, now on room air.  Overall improved.  Has oxygen at home which he uses intermittently.  Has been prescribed CPAP but refuses at home as is unable to sleep with it.   Toxic encephalopathy --Sudden onset confusion 9/12, seen by neurology for code stroke, CT head and MRI brain no stroke.  EEG unrevealing.  Patient much improved after discontinuation of cefepime.  Discussed with neurology, felt to be secondary to cefepime.  Neurology has signed off.  No further recommendations. -- Clinically much improved .   Generalized weakness --PT OT consulted with recommendation for PT and OT home health   Recent NSTEMI, status post PCI with stent placement Coronary artery disease --S/p LHC and DES stent to RPDA on 9/5. --Cont home DAPT --Continue home Zetia and Crestor   Chronic diastolic CHF --Continue home cardiac medications   Type 2 diabetes with hyperglycemia --A1c 7.9 on 01/21/2022. --SSI       Consultants:  Neurology  Procedures performed:  None   Disposition: Home health Diet recommendation:  Cardiac diet DISCHARGE MEDICATION: Allergies as of 02/02/2022       Reactions   Lipitor [atorvastatin] Other (See Comments)   Hip pain        Medication List     TAKE these medications    Acetaminophen Extra Strength 500 MG Tabs Take 1 tablet (500 mg total) by mouth every 6 (six) hours as needed for mild pain or moderate pain.   aspirin EC 81 MG tablet Take 81 mg by mouth daily.   carvedilol 12.5 MG tablet Commonly known as: COREG Take 1 tablet (12.5 mg total) by mouth 2 (two) times daily.   cetirizine 10 MG tablet Commonly known as: ZYRTEC Take  10 mg by mouth daily.   cholecalciferol 25 MCG (1000 UNIT) tablet Commonly known as: VITAMIN D3 Take 1,000 Units by mouth daily.   clopidogrel 75 MG tablet Commonly known as: PLAVIX Take 1 tablet (75 mg total) by mouth daily with breakfast.   dapagliflozin propanediol 10 MG Tabs tablet Commonly  known as: Farxiga Take 1 tablet (10 mg total) by mouth daily before breakfast.   ezetimibe 10 MG tablet Commonly known as: ZETIA Take 10 mg by mouth daily.   meclizine 25 MG tablet Commonly known as: ANTIVERT Take 1 tablet (25 mg total) by mouth 3 (three) times daily as needed for dizziness.   Melatonin 10 MG Tabs Take 10 mg by mouth at bedtime as needed (sleep).   metFORMIN 500 MG 24 hr tablet Commonly known as: GLUCOPHAGE-XR Take 500 mg by mouth daily.   pantoprazole 20 MG tablet Commonly known as: PROTONIX Take 20 mg by mouth daily before breakfast.   rosuvastatin 10 MG tablet Commonly known as: Crestor Take 1 tablet (10 mg total) by mouth daily.   tamsulosin 0.4 MG Caps capsule Commonly known as: FLOMAX Take 1 capsule (0.4 mg total) by mouth daily after supper.   vitamin B-12 500 MCG tablet Commonly known as: CYANOCOBALAMIN Take 500 mcg by mouth daily.       Feels much better  Discharge Exam: Filed Weights   01/30/22 0500 01/31/22 0557 02/01/22 0500  Weight: 89.2 kg 86.1 kg 86.6 kg   Physical Exam Vitals reviewed.  Constitutional:      General: He is not in acute distress.    Appearance: He is not ill-appearing or toxic-appearing.  Cardiovascular:     Rate and Rhythm: Normal rate and regular rhythm.     Heart sounds: No murmur heard. Pulmonary:     Effort: Pulmonary effort is normal. No respiratory distress.     Breath sounds: No wheezing, rhonchi or rales.  Neurological:     Mental Status: He is alert.  Psychiatric:        Mood and Affect: Mood normal.        Behavior: Behavior normal.        Thought Content: Thought content normal.      Condition at discharge: good  The results of significant diagnostics from this hospitalization (including imaging, microbiology, ancillary and laboratory) are listed below for reference.   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 There deficit, acute, stroke suspected. Speech disturbance. EXAM: MRI HEAD WITHOUT CONTRAST TECHNIQUE: Multiplanar, multiecho pulse sequences of the brain and surrounding structures were obtained without intravenous contrast. COMPARISON:  CT studies yesterday.  MRI 08/13/2021 FINDINGS: Brain: Diffusion imaging does not show any acute or subacute infarction. There chronic small-vessel ischemic changes throughout the pons. No focal cerebellar insult. Cerebral hemispheres show presence of a VP shunt placed from a right parietal approach, entering the right lateral ventricle, crossing the midline in terminating in the left lateral ventricle. Ventricular size is stable. Chronic small-vessel ischemic changes of the cerebral hemispheric white matter are stable. Gliosis along the shunt track is stable. 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 No evidence of enlargement or active bleeding. Vascular: Low Major vessels at the base of the brain show flow. Skull and upper cervical spine: Negative Sinuses/Orbits: Clear/normal Other: None IMPRESSION: No acute or reversible finding. VP shunt is seen previously.  Stable ventricular size. Chronic small-vessel ischemic changes affecting the pons and cerebral hemispheric white matter.  Stable bilateral low-density subdural hygromas. Electronically Signed   By: Nelson Chimes M.D.   On: 02/01/2022 09:23   Overnight EEG with video  Result Date: 02/01/2022 Lora Havens, MD     02/01/2022  9:28 AM Patient Name: MEIKO STRANAHAN MRN: [id number] Epilepsy Attending: Lora Havens Referring Physician/Provider: Lorenza Chick, MD Duration: 01/31/2022 1519 to 02/01/2022 0036, 0422 to 4098  Patient history: 81yo M with ams. EEG to evaluate for seizure  Level of alertness: Awake  AEDs during EEG study: None  Technical aspects: This EEG study was done with scalp electrodes positioned according to the 10-20 International system of electrode placement. Electrical activity was  reviewed with band pass filter of 1-'70Hz'$ , sensitivity of 7 uV/mm, display speed of 84m/sec with a '60Hz'$  notched filter applied as appropriate. EEG data were recorded continuously and digitally stored.  Video monitoring was available and reviewed as appropriate.  Description: No posterior dominant rhythm was seen. EEG showed continuous generalized 3 to 6 Hz theta-delta slowing. Hyperventilation and photic stimulation were not performed.   Parts of study were missing between 0036 to 0422 as patient pulled his electrodes.  ABNORMALITY - Continuous slow, generalized  IMPRESSION: This study is suggestive of moderate diffuse encephalopathy, nonspecific etiology. No seizures or definite epileptiform discharges were seen throughout the recording.  PLora Havens   EEG adult  Result Date: 01/31/2022 YLora Havens MD     01/31/2022  3:23 PM Patient Name: DULRIC SALZMANMRN: [id number] Attending: PLora HavensReferring Physician/Provider: BLorenza Chick MD Date: 01/31/2022 Duration: 22.50 mins Patient history: 89yoM with ams. EEG to evaluate for seizure Level of alertness: Awake AEDs during EEG study: None Technical aspects: This EEG study was done with scalp electrodes positioned according to the 10-20 International system of electrode placement. Electrical activity was reviewed with band pass filter of 1-'70Hz'$ , sensitivity of 7 uV/mm, display speed of 339msec with a '60Hz'$  notched filter applied as appropriate. EEG data were recorded continuously and digitally stored.  Video monitoring was available and reviewed as appropriate. Description: No posterior dominant rhythm was seen. EEG showed continuous generalized 3 to 6 Hz theta-delta slowing. Generalized periodic discharges with triphasic morphology at 1-2 Hz were also noted at times.  Hyperventilation and photic stimulation were not performed.   ABNORMALITY - Periodic discharges with triphasic morphology, generalized ( GPDs) - Continuous slow,  generalized IMPRESSION: This study  showed generalized periodic discharges with triphasic morphology which can be on the ictal-interictal continuum. However, toxic metabolic causes like cefepime toxicity can have lead to similar EEG pattern. Additionally there is moderate diffuse encephalopathy, nonspecific etiology. No seizures were seen throughout the recording. Priyanka O Barbra Sarks CT HEAD CODE STROKE WO CONTRAST  Result Date: 01/31/2022 CLINICAL DATA:  Neuro deficit, acute, stroke suspected aphasia; Neuro deficit, acute, stroke suspected EXAM: CT ANGIOGRAPHY HEAD AND NECK CT HEAD WITHOUT CONTRAST CT PERFUSION BRAIN TECHNIQUE: Contiguous axial images were obtained from the base of the skull through the vertex without intravenous contrast. Multidetector CT imaging of the head and neck was performed using the standard protocol during bolus administration of intravenous contrast. Multiplanar CT image reconstructions and MIPs were obtained to evaluate the vascular anatomy. Carotid stenosis measurements (when applicable) are obtained utilizing NASCET criteria, using the distal internal carotid diameter as the denominator. Multiphase CT imaging of the brain was performed following IV bolus contrast injection. Subsequent parametric perfusion maps were calculated using RAPID software. RADIATION DOSE REDUCTION: This exam was  performed according to the departmental dose-optimization program which includes automated exposure control, adjustment of the mA and/or kV according to patient size and/or use of iterative reconstruction technique. CONTRAST:  1820m OMNIPAQUE IOHEXOL 350 MG/ML SOLN COMPARISON:  None Available. CT head 08/12/2021 FINDINGS: CT HEAD FINDINGS Brain: No CT evidence of acute infarction, hemorrhage, hydrocephalus, extra-axial collection or mass lesion/mass effect. Redemonstrated right parietal approach ventriculostomy catheter with the tip positioned in the left lateral ventricle. Redemonstrated bilateral  subdural fluid collections which appear unchanged compared to 08/12/2021, and may be related to CSF shunting. Vascular: Please see same-day CTA below Skull: No suspicious osseous lesions Sinuses/Orbits: Bilateral lens replacements. Mild mucosal thickening right posterior ethmoid air cells. Review of the MIP images confirms the above findings CTA NECK FINDINGS Limitations: Assessment of the origins of the branch vessels is limited due to motion artifact. Aortic arch: Standard branching. Imaged portion shows no evidence of aneurysm or dissection. No significant stenosis of the major arch vessel origins. Right carotid system: Origin of the right common carotid artery is incompletely assessed due to motion artifact. There is mild stenosis at the origin of the right internal carotid artery. Left carotid system: There is approximate 50% stenosis at the origin of the left internal carotid artery. Vertebral arteries: Right dominant vertebral system. Origin of the right vertebral artery is incompletely assessed due to motion artifact. No evidence of high-grade stenosis in the V2-V4 segments of the right vertebral artery. The V4 segment of the left vertebral artery is diminutive, likely congenital. Skeleton: Multilevel degenerative changes in the cervical spine. Other neck: Incidentally noted is a 9 mm right thyroid nodule. Upper chest: Trace right pleural effusion. Review of the MIP images confirms the above findings CTA HEAD FINDINGS Anterior circulation: No significant stenosis, proximal occlusion, aneurysm, or vascular malformation. Posterior circulation: No significant stenosis, proximal occlusion, aneurysm, or vascular malformation. Venous sinuses: As permitted by contrast timing, patent. Anatomic variants: None Review of the MIP images confirms the above findings CT Brain Perfusion Findings: ASPECTS: 10 CBF (<30%) Volume: 053mPerfusion (Tmax>6.0s) volume: 20m64mismatch Volume: 20mL41mfarction Location:Not applicable  IMPRESSION: 1. No hemorrhage or CT evidence of acute infarct. 2. No intracranial large vessel occlusion or hemodynamically significant stenosis. 3. Redemonstrated bilateral subdural fluid collections which appear unchanged compared to 08/12/2021, and may be related to CSF shunting. These results were paged via amion to Dr. SrisLesleigh Noethe time of interpretation on 01/31/2022 at 1:17 pm Electronically Signed   By: HemaMarin Roberts.   On: 01/31/2022 13:19   CT ANGIO HEAD NECK W WO CM W PERF (CODE STROKE)  Result Date: 01/31/2022 CLINICAL DATA:  Neuro deficit, acute, stroke suspected aphasia; Neuro deficit, acute, stroke suspected EXAM: CT ANGIOGRAPHY HEAD AND NECK CT HEAD WITHOUT CONTRAST CT PERFUSION BRAIN TECHNIQUE: Contiguous axial images were obtained from the base of the skull through the vertex without intravenous contrast. Multidetector CT imaging of the head and neck was performed using the standard protocol during bolus administration of intravenous contrast. Multiplanar CT image reconstructions and MIPs were obtained to evaluate the vascular anatomy. Carotid stenosis measurements (when applicable) are obtained utilizing NASCET criteria, using the distal internal carotid diameter as the denominator. Multiphase CT imaging of the brain was performed following IV bolus contrast injection. Subsequent parametric perfusion maps were calculated using RAPID software. RADIATION DOSE REDUCTION: This exam was performed according to the departmental dose-optimization program which includes automated exposure control, adjustment of the mA and/or kV according to patient size and/or use of  iterative reconstruction technique. CONTRAST:  164m OMNIPAQUE IOHEXOL 350 MG/ML SOLN COMPARISON:  None Available. CT head 08/12/2021 FINDINGS: CT HEAD FINDINGS Brain: No CT evidence of acute infarction, hemorrhage, hydrocephalus, extra-axial collection or mass lesion/mass effect. Redemonstrated right parietal approach  ventriculostomy catheter with the tip positioned in the left lateral ventricle. Redemonstrated bilateral subdural fluid collections which appear unchanged compared to 08/12/2021, and may be related to CSF shunting. Vascular: Please see same-day CTA below Skull: No suspicious osseous lesions Sinuses/Orbits: Bilateral lens replacements. Mild mucosal thickening right posterior ethmoid air cells. Review of the MIP images confirms the above findings CTA NECK FINDINGS Limitations: Assessment of the origins of the branch vessels is limited due to motion artifact. Aortic arch: Standard branching. Imaged portion shows no evidence of aneurysm or dissection. No significant stenosis of the major arch vessel origins. Right carotid system: Origin of the right common carotid artery is incompletely assessed due to motion artifact. There is mild stenosis at the origin of the right internal carotid artery. Left carotid system: There is approximate 50% stenosis at the origin of the left internal carotid artery. Vertebral arteries: Right dominant vertebral system. Origin of the right vertebral artery is incompletely assessed due to motion artifact. No evidence of high-grade stenosis in the V2-V4 segments of the right vertebral artery. The V4 segment of the left vertebral artery is diminutive, likely congenital. Skeleton: Multilevel degenerative changes in the cervical spine. Other neck: Incidentally noted is a 9 mm right thyroid nodule. Upper chest: Trace right pleural effusion. Review of the MIP images confirms the above findings CTA HEAD FINDINGS Anterior circulation: No significant stenosis, proximal occlusion, aneurysm, or vascular malformation. Posterior circulation: No significant stenosis, proximal occlusion, aneurysm, or vascular malformation. Venous sinuses: As permitted by contrast timing, patent. Anatomic variants: None Review of the MIP images confirms the above findings CT Brain Perfusion Findings: ASPECTS: 10 CBF (<30%)  Volume: 063mPerfusion (Tmax>6.0s) volume: 54m354mismatch Volume: 54mL32mfarction Location:Not applicable IMPRESSION: 1. No hemorrhage or CT evidence of acute infarct. 2. No intracranial large vessel occlusion or hemodynamically significant stenosis. 3. Redemonstrated bilateral subdural fluid collections which appear unchanged compared to 08/12/2021, and may be related to CSF shunting. These results were paged via amion to Dr. SrisLesleigh Noethe time of interpretation on 01/31/2022 at 1:17 pm Electronically Signed   By: HemaMarin Roberts.   On: 01/31/2022 13:19   CT Angio Chest PE W and/or Wo Contrast  Result Date: 01/27/2022 CLINICAL DATA:  Shortness of breath; positive D-dimer, PE suspected EXAM: CT ANGIOGRAPHY CHEST WITH CONTRAST TECHNIQUE: Multidetector CT imaging of the chest was performed using the standard protocol during bolus administration of intravenous contrast. Multiplanar CT image reconstructions and MIPs were obtained to evaluate the vascular anatomy. RADIATION DOSE REDUCTION: This exam was performed according to the departmental dose-optimization program which includes automated exposure control, adjustment of the mA and/or kV according to patient size and/or use of iterative reconstruction technique. CONTRAST:  854mL49mIPAQUE IOHEXOL 350 MG/ML SOLN COMPARISON:  CTA chest 07/05/2019 FINDINGS: Cardiovascular: Satisfactory opacification of the pulmonary arteries to the segmental level. No evidence of pulmonary embolism. Normal heart size. No pericardial effusion. Aortic and coronary artery atherosclerotic calcification. Mediastinum/Nodes: No enlarged mediastinal, hilar, or axillary lymph nodes. Thyroid gland, trachea, and esophagus demonstrate no significant findings. Lungs/Pleura: Chronic elevation of the right hemidiaphragm. Increased atelectasis and/or infiltrates in the right middle and lower lobes. New small right pleural effusion. Similar atelectasis or infiltrates in the left lower lobe. No  left pleural  effusion. Upper Abdomen: No acute abdominal finding. Elevated right hemidiaphragm as noted with interposition of bowel between the liver and hemidiaphragm. Musculoskeletal: No chest wall abnormality. No acute osseous findings. Review of the MIP images confirms the above findings. IMPRESSION: 1. No acute pulmonary embolism. 2. Increased atelectasis/infiltrate in both lower lobes greater on the right with new small right pleural effusion. Some of this is chronic however superimposed pneumonia is difficult to exclude. 3. Coronary artery and aortic atherosclerosis. Electronically Signed   By: Placido Sou M.D.   On: 01/27/2022 19:57   DG Abd 1 View  Result Date: 01/27/2022 CLINICAL DATA:  Evaluate for shunt malfunction EXAM: ABDOMEN - 1 VIEW COMPARISON:  Abdominal x-ray 05/29/2021 FINDINGS: Shunt catheter tip is in the right upper quadrant. This has slightly shifted positions when compared to the prior study. Bowel-gas pattern is nonobstructive. The visualized portion of the catheter appears intact. Multiple lines overlie the upper abdomen. Lower lumbar fusion hardware is present. Calcifications overlie the right kidney, unchanged. Prostate radiotherapy seeds are present. IMPRESSION: 1. Shunt catheter tip is in the right upper quadrant and appears intact. 2. Nonobstructive bowel gas pattern. Electronically Signed   By: Ronney Asters M.D.   On: 01/27/2022 18:46   DG Skull 1-3 Views  Result Date: 01/27/2022 CLINICAL DATA:  Evaluate for shunt malfunction. EXAM: SKULL - 2 VIEW COMPARISON:  CT head without contrast 08/12/2021 FINDINGS: A programmable shunt is in place. Tracheostomy catheter is stable. Tubing is contiguous from the programmable component into the upper chest. IMPRESSION: Programmable shunt with tubing contiguous from the programmable component into the upper chest. No acute or focal lesion. No evidence for complication. No other significant interval change. Electronically Signed   By:  San Morelle M.D.   On: 01/27/2022 18:45   DG Cervical Spine 1 View  Result Date: 01/27/2022 CLINICAL DATA:  Evaluate shunt malfunction EXAM: DG CERVICAL SPINE - 1 VIEW COMPARISON:  None. FINDINGS: Shunt catheter overlies the mid right skull and right neck. The visualized portion of the catheter appears intact. Degenerative changes affect the cervical spine. There are atherosclerotic calcifications in the left neck. IMPRESSION: Visualized portion of the right-sided shunt catheter appears intact. Electronically Signed   By: Ronney Asters M.D.   On: 01/27/2022 18:44   DG Chest Portable 1 View  Result Date: 01/27/2022 CLINICAL DATA:  Hypoxia. EXAM: PORTABLE CHEST 1 VIEW COMPARISON:  Chest x-rays dated 01/20/2022 and 08/13/2021. FINDINGS: Heart size and mediastinal contours are stable. Chronic bibasilar opacities, likely chronic atelectasis and/or scarring. Increased opacity at the LEFT costophrenic angle. Upper lungs are relatively clear. No pneumothorax is seen. IMPRESSION: 1. Increased opacity at the LEFT costophrenic angle, pneumonia versus atelectasis and/or small pleural effusion. 2. Additional chronic bibasilar opacities, compatible with chronic atelectasis and/or scarring. Electronically Signed   By: Franki Cabot M.D.   On: 01/27/2022 17:29   CARDIAC CATHETERIZATION  Result Date: 01/24/2022   Prox LAD lesion is 50% stenosed.   1st Diag lesion is 50% stenosed.   Prox RCA to Mid RCA lesion is 15% stenosed.   RPDA lesion is 90% stenosed.   A drug-eluting stent was successfully placed using a STENT ONYX FRONTIER 2.5X12.   Post intervention, there is a 0% residual stenosis.   LV end diastolic pressure is normal. 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 Anticipate DC in am   ECHOCARDIOGRAM COMPLETE  Result Date: 01/21/2022  ECHOCARDIOGRAM REPORT   Patient Name:   CHIDIEBERE WYNN Date of Exam: 01/21/2022  Medical Rec #:  [id number]       Height:       70.0 in Accession #:    [id number]      Weight:       196.9 lb Date of Birth:  1941-02-20       BSA:          2.074 m Patient Age:    70 years        BP:           174/100 mmHg Patient Gender: M               HR:           79 bpm. Exam Location:  Inpatient Procedure: 2D Echo, Cardiac Doppler and Color Doppler                              MODIFIED REPORT: This report was modified by Cherlynn Kaiser MD on 01/21/2022 due to revision.  Indications:     Chest pain  History:         Patient has prior history of Echocardiogram examinations, most                  recent 12/15/2018. Risk Factors:Sleep Apnea, Diabetes and                  Dyslipidemia.  Sonographer:     Clayton Lefort RDCS (AE) Referring Phys:  [id number] [facility] Diagnosing Phys: Cherlynn Kaiser MD IMPRESSIONS  1. Left ventricular ejection fraction, by estimation, is 55 to 60%. The left ventricle has normal function. Left ventricular endocardial border not optimally defined to evaluate regional wall motion. Left ventricular diastolic parameters are indeterminate.  2. Right ventricular systolic function is mildly reduced with hypokinesis of the mid ventricle and relative apical sparing. The right ventricular size is mildly enlarged. IVC not assessed, cannot calculate RVSP.  3. Dilated coronary sinus.  4. The mitral valve is normal in structure. Mild mitral valve regurgitation. No evidence of mitral stenosis.  5. The aortic valve is tricuspid. Aortic valve regurgitation is mild to moderate. No aortic stenosis is present.  6. Aortic dilatation noted. 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 FINDINGS  Left Ventricle: Left ventricular ejection fraction, by estimation, is 55 to 60%. The left ventricle has normal function. Left ventricular endocardial border not optimally defined to evaluate regional wall motion. The left ventricular internal cavity size was normal in size. There is no left ventricular  hypertrophy. Left ventricular diastolic parameters are indeterminate. Right Ventricle: The right ventricular size is mildly enlarged. No increase in right ventricular wall thickness. Right ventricular systolic function is mildly reduced. Tricuspid regurgitation signal is inadequate for assessing PA pressure. Left Atrium: Left atrial size was normal in size. Right Atrium: Right atrial size was normal in size. Pericardium: There is no evidence of pericardial effusion. Presence of epicardial fat layer. Mitral Valve: The mitral valve is normal in structure. Mild mitral annular calcification. Mild mitral valve regurgitation. No evidence of mitral valve stenosis. Tricuspid Valve: The tricuspid valve is normal in structure. Tricuspid valve regurgitation is trivial. No evidence of tricuspid stenosis. Aortic Valve: The aortic valve is tricuspid. Aortic valve regurgitation is mild to moderate. No aortic stenosis is present. Aortic valve mean gradient measures 1.0 mmHg. Aortic valve peak gradient measures  2.7 mmHg. Aortic valve area, by VTI measures 3.09 cm. Pulmonic Valve: The pulmonic valve was normal in structure. Pulmonic valve regurgitation is trivial. No evidence of pulmonic stenosis. Aorta: Aortic dilatation noted. 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 Venous: The inferior vena cava was not well visualized. IAS/Shunts: No atrial level shunt detected by color flow Doppler. Additional Comments: A is visualized in the inferior vena cava.  LEFT VENTRICLE PLAX 2D LVIDd:         4.30 cm   Diastology LVIDs:         2.70 cm   LV e' medial:    3.26 cm/s LV PW:         1.50 cm   LV E/e' medial:  7.3 LV IVS:        1.60 cm   LV e' lateral:   2.83 cm/s LVOT diam:     2.10 cm   LV E/e' lateral: 8.4 LV SV:         49 LV SV Index:   24 LVOT Area:     3.46 cm  RIGHT VENTRICLE TAPSE (M-mode): 0.9 cm LEFT ATRIUM           Index LA diam:      2.90 cm 1.40 cm/m LA Vol (A4C): 33.5 ml 16.16 ml/m  AORTIC VALVE AV  Area (Vmax):    3.04 cm AV Area (Vmean):   3.00 cm AV Area (VTI):     3.09 cm AV Vmax:           82.70 cm/s AV Vmean:          55.300 cm/s AV VTI:            0.159 m AV Peak Grad:      2.7 mmHg AV Mean Grad:      1.0 mmHg LVOT Vmax:         72.50 cm/s LVOT Vmean:        47.900 cm/s LVOT VTI:          0.142 m LVOT/AV VTI ratio: 0.89  AORTA Ao Root diam: 4.20 cm Ao Asc diam:  3.90 cm MITRAL VALVE MV Area (PHT): 7.44 cm    SHUNTS MV Decel Time: 102 msec    Systemic VTI:  0.14 m MV E velocity: 23.70 cm/s  Systemic Diam: 2.10 cm MV A velocity: 77.10 cm/s MV E/A ratio:  0.31 Cherlynn Kaiser MD Electronically signed by Cherlynn Kaiser MD Signature Date/Time: 01/21/2022/1:07:33 PM    Final (Updated)    DG Chest 2 View  Result Date: 01/20/2022 CLINICAL DATA:  Chest pain EXAM: CHEST - 2 VIEW COMPARISON:  08/13/2021 FINDINGS: Stable elevation of the right hemidiaphragm. Mild left basilar parenchymal scarring. No superimposed focal pulmonary infiltrate. No pneumothorax or pleural effusion. Cardiac size is within normal limits. Ventriculoperitoneal shunt catheter tubing overlies the right hemithorax, unchanged. No acute bone abnormality. IMPRESSION: No radiographic evidence of acute cardiopulmonary disease. Electronically Signed   By: Fidela Salisbury M.D.   On: 01/20/2022 22:37    Microbiology: Results for orders placed or performed during the hospital encounter of 01/27/22  Resp Panel by RT-PCR (Flu A&B, Covid) Anterior Nasal Swab     Status: None   Collection Time: 01/27/22  5:46 PM   Specimen: Anterior Nasal Swab  Result Value Ref Range Status   SARS Coronavirus 2 by RT PCR NEGATIVE NEGATIVE Final    Comment: (NOTE) SARS-CoV-2 target nucleic acids are NOT DETECTED.  The SARS-CoV-2  RNA is generally detectable in upper respiratory specimens during the acute phase of infection. The lowest concentration of SARS-CoV-2 viral copies this assay can detect is 138 copies/mL. A negative result does not preclude  SARS-Cov-2 infection and should not be used as the sole basis for treatment or other patient management decisions. A negative result may occur with  improper specimen collection/handling, submission of specimen other than nasopharyngeal swab, presence of viral mutation(s) within the areas targeted by this assay, and inadequate number of viral copies(<138 copies/mL). A negative result must be combined with clinical observations, patient history, and epidemiological information. The expected result is Negative.  Fact Sheet for Patients:  EntrepreneurPulse.com.au  Fact Sheet for Healthcare Providers:  IncredibleEmployment.be  This test is no t yet approved or cleared by the [place] FDA and  has been authorized for detection and/or diagnosis of SARS-CoV-2 by FDA under an Emergency Use Authorization (EUA). This EUA will remain  in effect (meaning this test can be used) for the duration of the COVID-19 declaration under Section 564(b)(1) of the Act, 21 U.S.C.section 360bbb-3(b)(1), unless the authorization is terminated  or revoked sooner.       Influenza A by PCR NEGATIVE NEGATIVE Final   Influenza B by PCR NEGATIVE NEGATIVE Final    Comment: (NOTE) The Xpert Xpress SARS-CoV-2/FLU/RSV plus assay is intended as an aid in the diagnosis of influenza from Nasopharyngeal swab specimens and should not be used as a sole basis for treatment. Nasal washings and aspirates are unacceptable for Xpert Xpress SARS-CoV-2/FLU/RSV testing.  Fact Sheet for Patients: EntrepreneurPulse.com.au  Fact Sheet for Healthcare Providers: IncredibleEmployment.be  This test is not yet approved or cleared by the [place] FDA and has been authorized for detection and/or diagnosis of SARS-CoV-2 by FDA under an Emergency Use Authorization (EUA). This EUA will remain in effect (meaning this test can be used) for the duration of  the COVID-19 declaration under Section 564(b)(1) of the Act, 21 U.S.C. section 360bbb-3(b)(1), unless the authorization is terminated or revoked.  Performed at [facility], [place] [address], [place], [place] [address]   MRSA Next Gen by PCR, Nasal     Status: None   Collection Time: 01/27/22  9:07 PM   Specimen: Nasal Mucosa; Nasal Swab  Result Value Ref Range Status   MRSA by PCR Next Gen NOT DETECTED NOT DETECTED Final    Comment: (NOTE) The GeneXpert MRSA Assay (FDA approved for NASAL specimens only), is one component of a comprehensive MRSA colonization surveillance program. It is not intended to diagnose MRSA infection nor to guide or monitor treatment for MRSA infections. Test performance is not FDA approved in patients less than 55 years old. Performed at [facility], [place] [address] [place],  [address]     Labs: CBC: Recent Labs  Lab 01/27/22 1706 01/27/22 2000 01/28/22 0317 01/29/22 0448 01/30/22 0445 01/31/22 0519 02/01/22 1010  WBC 6.6   < > 7.0 7.1 6.7 7.5 7.0  NEUTROABS 4.7  --  5.0  --   --   --   --   HGB 13.7   < > 13.0 12.6* 12.8* 13.5 14.4  HCT 45.7   < > 42.4 40.9 41.3 42.0 44.8  MCV 105.5*   < > 103.9* 102.5* 102.5* 98.6 99.1  PLT 150   < > 130* 133* 141* 153 179   < > = values in this interval not displayed.   Basic Metabolic Panel: Recent Labs  Lab 01/28/22 0317 01/29/22  0448 01/30/22 0748 01/31/22 0519 02/01/22 1010  NA 145 144 144 145 145  K 4.1 4.1 3.9 3.6 4.2  CL 109 107 106 103 104  CO2 26 28 32 34* 34*  GLUCOSE 129* 176* 139* 145* 115*  BUN 37* 30* 26* 28* 28*  CREATININE 1.52* 1.47* 1.48* 1.42* 1.28*  CALCIUM 8.0* 8.4* 8.8* 8.9 9.1  MG 1.9  --   --   --  1.9  PHOS 4.2  --   --   --   --    Liver Function Tests: Recent Labs  Lab 01/29/22 0448 01/30/22 0748 01/31/22 0519 02/01/22 1010  AST 14* 14* 14* 27  ALT '14 12 10 10  '$ ALKPHOS 46 48 42 45  BILITOT 0.5 0.5 0.8 1.6*  PROT 5.0* 5.9* 5.7* 6.0*   ALBUMIN 2.8* 3.1* 2.9* 3.1*   CBG: Recent Labs  Lab 02/01/22 1602 02/01/22 2101 02/02/22 0617 02/02/22 1148 02/02/22 1245  GLUCAP 197* 140* 128* 249* 235*    Discharge time spent: greater than 30 minutes.  Signed: Murray Hodgkins, MD [facility] 02/02/2022

## 2022-02-03 ENCOUNTER — Other Ambulatory Visit: Payer: Self-pay

## 2022-02-03 ENCOUNTER — Ambulatory Visit: Payer: Medicare Other | Admitting: Student

## 2022-02-03 DIAGNOSIS — I5032 Chronic diastolic (congestive) heart failure: Secondary | ICD-10-CM

## 2022-02-03 DIAGNOSIS — J189 Pneumonia, unspecified organism: Secondary | ICD-10-CM

## 2022-02-03 DIAGNOSIS — I214 Non-ST elevation (NSTEMI) myocardial infarction: Secondary | ICD-10-CM

## 2022-02-03 NOTE — Patient Outreach (Signed)
ITALO BANTON 1940/07/17 [id number]  [facility] [[facility]] Patient: Jeremiah [name] [facility]  Referral:  Referral request for post hospital care coordination needs, referred.  Natividad Brood, RN BSN [place]  [phone] business mobile phone Toll free office [phone]  *Agenda  [phone] Fax number: [phone] [email or web address]

## 2022-02-04 DIAGNOSIS — Z982 Presence of cerebrospinal fluid drainage device: Secondary | ICD-10-CM | POA: Diagnosis not present

## 2022-02-04 DIAGNOSIS — Z7982 Long term (current) use of aspirin: Secondary | ICD-10-CM | POA: Diagnosis not present

## 2022-02-04 DIAGNOSIS — Z87440 Personal history of urinary (tract) infections: Secondary | ICD-10-CM | POA: Diagnosis not present

## 2022-02-04 DIAGNOSIS — Z87442 Personal history of urinary calculi: Secondary | ICD-10-CM | POA: Diagnosis not present

## 2022-02-04 DIAGNOSIS — G4733 Obstructive sleep apnea (adult) (pediatric): Secondary | ICD-10-CM | POA: Diagnosis not present

## 2022-02-04 DIAGNOSIS — J9811 Atelectasis: Secondary | ICD-10-CM | POA: Diagnosis not present

## 2022-02-04 DIAGNOSIS — G912 (Idiopathic) normal pressure hydrocephalus: Secondary | ICD-10-CM | POA: Diagnosis not present

## 2022-02-04 DIAGNOSIS — K5790 Diverticulosis of intestine, part unspecified, without perforation or abscess without bleeding: Secondary | ICD-10-CM | POA: Diagnosis not present

## 2022-02-04 DIAGNOSIS — J9621 Acute and chronic respiratory failure with hypoxia: Secondary | ICD-10-CM | POA: Diagnosis not present

## 2022-02-04 DIAGNOSIS — Z85828 Personal history of other malignant neoplasm of skin: Secondary | ICD-10-CM | POA: Diagnosis not present

## 2022-02-04 DIAGNOSIS — I110 Hypertensive heart disease with heart failure: Secondary | ICD-10-CM | POA: Diagnosis not present

## 2022-02-04 DIAGNOSIS — Z923 Personal history of irradiation: Secondary | ICD-10-CM | POA: Diagnosis not present

## 2022-02-04 DIAGNOSIS — E785 Hyperlipidemia, unspecified: Secondary | ICD-10-CM | POA: Diagnosis not present

## 2022-02-04 DIAGNOSIS — C61 Malignant neoplasm of prostate: Secondary | ICD-10-CM | POA: Diagnosis not present

## 2022-02-04 DIAGNOSIS — M1990 Unspecified osteoarthritis, unspecified site: Secondary | ICD-10-CM | POA: Diagnosis not present

## 2022-02-04 DIAGNOSIS — Z8719 Personal history of other diseases of the digestive system: Secondary | ICD-10-CM | POA: Diagnosis not present

## 2022-02-04 DIAGNOSIS — I5032 Chronic diastolic (congestive) heart failure: Secondary | ICD-10-CM | POA: Diagnosis not present

## 2022-02-04 DIAGNOSIS — Z955 Presence of coronary angioplasty implant and graft: Secondary | ICD-10-CM | POA: Diagnosis not present

## 2022-02-04 DIAGNOSIS — E1165 Type 2 diabetes mellitus with hyperglycemia: Secondary | ICD-10-CM | POA: Diagnosis not present

## 2022-02-04 DIAGNOSIS — I214 Non-ST elevation (NSTEMI) myocardial infarction: Secondary | ICD-10-CM | POA: Diagnosis not present

## 2022-02-04 DIAGNOSIS — G929 Unspecified toxic encephalopathy: Secondary | ICD-10-CM | POA: Diagnosis not present

## 2022-02-04 DIAGNOSIS — J302 Other seasonal allergic rhinitis: Secondary | ICD-10-CM | POA: Diagnosis not present

## 2022-02-04 DIAGNOSIS — Z86010 Personal history of colonic polyps: Secondary | ICD-10-CM | POA: Diagnosis not present

## 2022-02-04 DIAGNOSIS — H3410 Central retinal artery occlusion, unspecified eye: Secondary | ICD-10-CM | POA: Diagnosis not present

## 2022-02-04 DIAGNOSIS — I2510 Atherosclerotic heart disease of native coronary artery without angina pectoris: Secondary | ICD-10-CM | POA: Diagnosis not present

## 2022-02-06 ENCOUNTER — Telehealth: Payer: Self-pay | Admitting: *Deleted

## 2022-02-06 ENCOUNTER — Ambulatory Visit: Payer: Self-pay

## 2022-02-06 NOTE — Patient Outreach (Signed)
  Care Coordination   Initial Visit Note   02/06/2022 Name: MYLIN HIRANO MRN: [id number] DOB: 12-30-1940  SAMUEL MCPEEK is a 81 y.o. year old male who sees [name], MD for primary care. I spoke with  [name] by phone today.  What matters to the patients health and wellness today?  To continue to improve since hospitalization    Goals Addressed             This Visit's Progress    COMPLETED: Care Coordination Activities       Care Coordination Interventions: Discussed patient doing well since returning home, is adhering to oxygen orders Confirmed patient is active with [facility], start of care began Saturday September 16 Determined patient is the primary caregiver of his spouse who has dementia, family increased caregiver hours from 2 days per week to 5 days per week over the next two weeks  Discussed family unsure if placement will be needed, daughter plans to speak with patient on Wednesday when her mom is not present to discuss patient needs as it upsets her mom to discuss healthcare concerns in front of her Education on the role of the nurse case manager provided - scheduled call between [place] and daughter [name] for Monday 9/25 Junious Silk that if SW needs arise the RN Care Manager will alert SW to contact family to assist Collaboration with RN Care Manager to advise of interventions and plan        SDOH assessments and interventions completed:  Yes  SDOH Interventions Today    Flowsheet Row Most Recent Value  SDOH Interventions   Food Insecurity Interventions Intervention Not Indicated  Housing Interventions Intervention Not Indicated  Transportation Interventions Intervention Not Indicated  Utilities Interventions Intervention Not Indicated        Care Coordination Interventions Activated:  Yes  Care Coordination Interventions:  Yes, provided   Follow up plan: Follow up call scheduled for 9/25 at 9:30 am with RN Care  Manager    Encounter Outcome:  Pt. Visit Completed   Daneen Schick, Arita Miss, CDP Social Worker, Certified Dementia Practitioner [place] Management  Care Coordination [phone]

## 2022-02-06 NOTE — Chronic Care Management (AMB) (Signed)
  Care Coordination   Note   02/06/2022 Name: Jeremiah [name] MRN: [id number] DOB: Mar 05, 1941  Jeremiah [name] is a 81 y.o. year old male who sees [name], MD for primary care. I reached out to [name] by phone today to offer care coordination services.  [name] was given information about Care Coordination services today including:   The Care Coordination services include support from the care team which includes your Nurse Coordinator, Clinical Social Worker, or Pharmacist.  The Care Coordination team is here to help remove barriers to the health concerns and goals most important to you. Care Coordination services are voluntary, and the patient may decline or stop services at any time by request to their care team member.   Care Coordination Consent Status: Patient daughter [name] agreed to services and verbal consent obtained.   Follow up plan:  Telephone appointment with care coordination team member scheduled for:  02/06/22  Encounter Outcome:  Pt. Scheduled

## 2022-02-06 NOTE — Patient Instructions (Signed)
Visit Information  Thank you for taking time to visit with me today. Please don't hesitate to contact me if I can be of assistance to you.   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, family increased caregiver hours from 2 days per week to 5 days per week over the next two weeks  Discussed family unsure if placement will be needed, daughter plans to speak with patient on Wednesday when her mom is not present to discuss patient needs as it upsets her mom to discuss healthcare concerns in front of her Education on the role of the nurse case manager provided - scheduled call between [place] and daughter [name] for Monday 9/25 Junious Silk that if SW needs arise the [place] will alert SW to contact family to assist Collaboration with RN Care Manager to advise of interventions and plan        Please call the care guide team at [phone] if you need to schedule an appointment with me  If you are experiencing a Mental Health or [place] or need someone to talk to, please go to [facility] [address] [place] [phone])  Patient verbalizes understanding of instructions and care plan provided today and agrees to view in [place]. Active MyChart status and patient understanding of how to access instructions and care plan via MyChart confirmed with patient.     Telephone follow up appointment with care management team member scheduled for:9/25  Daneen Schick, BSW, CDP Social Worker, Certified Dementia Practitioner [place] Coordination [phone]

## 2022-02-08 ENCOUNTER — Encounter: Payer: Self-pay | Admitting: Neurology

## 2022-02-08 ENCOUNTER — Ambulatory Visit: Payer: Medicare Other | Admitting: Neurology

## 2022-02-08 VITALS — BP 127/83 | HR 77 | Ht 70.0 in | Wt 192.0 lb

## 2022-02-08 DIAGNOSIS — Z955 Presence of coronary angioplasty implant and graft: Secondary | ICD-10-CM | POA: Diagnosis not present

## 2022-02-08 DIAGNOSIS — Z789 Other specified health status: Secondary | ICD-10-CM | POA: Diagnosis not present

## 2022-02-08 DIAGNOSIS — G4733 Obstructive sleep apnea (adult) (pediatric): Secondary | ICD-10-CM

## 2022-02-08 DIAGNOSIS — Z9981 Dependence on supplemental oxygen: Secondary | ICD-10-CM | POA: Diagnosis not present

## 2022-02-08 NOTE — Progress Notes (Signed)
Subjective:    Patient ID: Jeremiah [name] is a 81 y.o. male.  HPI    Star Age, MD, PhD [facility] [address] [place], [place] [address]  Dear [name],  I saw your patient, Jeremiah [name], upon your kind request in my sleep clinic today for evaluation of his sleep apnea.  The patient is accompanied by his son today.  As you know, Jeremiah [name] is an 81 year old right-handed gentleman with an underlying complex medical history of hypertension, hyperlipidemia, BPH, central retinal artery occlusion, osteoarthritis, prostate cancer, status postradiation treatment, allergies, small bowel obstruction, diabetes, diverticulosis, kidney stones, tremor and gait disorder (seen by Dr. 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  He just recently got a new machine less than a week ago after he came home from the his hospital stay.  He was hospitalized in September with respiratory failure, pneumonia and confusion.  He had a heart attack in early September 2023.  He sees Dr. Salomon Fick for his NPH.   He is currently in home health therapy.  He has tried his new CPAP machine but is having trouble tolerating it.  He has oxygen via concentrator 24-7, at 2 L/min and uses oxygen with his CPAP.  He was previously followed in our sleep clinic but has not been seen in this clinic in over 4-1/2 years.  He had a split-night sleep study on 08/29/2015 which showed a baseline AHI of 57.4/h, O2 nadir 73%.  He was started on CPAP therapy.  A recent compliance download was not available for my review.   I reviewed your office note from 01/11/2022.  His Epworth sleepiness score is 11 out of 24, fatigue severity score is 33 out of 63.  He has significant nocturia about 3 times per average night, goes to bed around  10:30 PM and rise time is between 5 and 6 AM.  He drinks caffeine in the form of tea, 1 glass/day.  He was given supplemental oxygen from his recent hospitalization. He stopped using his CPAP consistently after he had back surgery.  He also had complications after his shunt surgery.  Previously (copied from previous notes for reference):   08/07/17 (MM): << Jeremiah [name] is a 81 year old male with a history of obstructive sleep apnea on CPAP.  He returns today for follow-up.  His CPAP download indicates that he use his machine 30 out of 30 days for compliance of 100%.  He uses machine greater than 4 hours 29 out of 30 days for compliance of 97%.  On average he uses his machine 6 hours and 39 minutes.  His residual AHI is 3.1 on 9 cm of water with EPR of 3.  He reports that he does not feel the machine leaking at night.  He reports that he continues to feel the machine is working well for him.  He returns today for an evaluation.>>  02/07/2017: 81 year old right-handed gentleman with an underlying medical history of prostate cancer, status post radioactive seed implant in February 2016 (s/p XRT prior to that), overweight state, back pain, diverticulosis, hyperlipidemia, history of fall with concussion in 2003, rosacea, osteoarthritis, retinal artery occlusion in December 2013, who presents for follow-up consultation of his obstructive sleep apnea, on CPAP therapy. The patient is unaccompanied today. I last saw him on 02/08/2016, at which time he reported  still trying to adjust to CPAP. He was about 50% with his compliance greater than 4 hours. He saw Ward Givens in the interim in December 2017 and then again in March 2018, again not completely compliant with treatment.   I reviewed his CPAP compliance data from 01/07/2017 through 02/05/2017 which is a total of 30 days, during which time he used his machine every night but percent used days greater than 4 hours was 20%, indicating poor compliance with an  average usage of 3 hours and 14 minutes, residual AHI 3.8 per hour, leak on the high side for the 95th percentile at 24.9 L/m on a pressure of 9 cm with EPR of 3. In the past 90 days his compliance percentage for more than 4 hours was also 20% only. He reports having difficulty with sleep maintenance difficulty and has to get up around 2 or 3 AM to go to the bathroom. After that, he typically does not CPAP back on. He is not actually bothered by the mask or the pressure. He does not currently take any sleep aid such as over-the-counter melatonin. He did not find it very helpful. He reports that he has gotten into the habit of not sleeping with CPAP once he goes to the bathroom and comes back. He would be willing to try more.     I saw him on 11/08/2015, at which time he reported fairly well. He felt he was adapting to CPAP therapy reasonably well and felt somewhat better rested and less tired during the day, nocturia had improved as well. He preferred the nasal pillows which he started using recently. Restless leg symptoms were infrequent and not very bothersome. In fact, he felt that his leg movements had improved since CPAP therapy. We talked about his split-night sleep study results from 08/29/2015 and his compliance data. He was not fully compliant with CPAP therapy. Residual AHI was 10 per hour. Leak was borderline.    I reviewed his CPAP compliance data from 01/08/2016 through 02/06/2016 which is a total of 30 days during which time he used his machine every night with percent used days greater than 4 hours at 50%, indicating suboptimal compliance, average usage of 3 hours at 69 minutes, residual AHI borderline at 5.1 per hour, leak acceptable with the 95th percentile at 13.8 L/m at a pressure of 9 cm with EPR of 3.      I first met him on 07/28/2015 at the request of his primary care physician, at which time he reported snoring and excessive daytime somnolence as well as difficulty initiating and  maintaining sleep. His wife had noticed witnessed apneic pauses. He had a split-night sleep study on 08/29/2015. Sleep efficiency at baseline was 57.7% with a latency to sleep of 10 minutes and wake after sleep onset of 109.5 minutes with moderate sleep fragmentation noted. He had an increased percentage of light stage sleep, absence of slow-wave sleep and REM sleep was 5.5% with a prolonged REM latency. He had moderate PLMS with mild arousals. He had no significant EKG changes. Mild to moderate snoring was noted. Total AHI was 57.1 per hour. Average oxygen saturation was only 87%, nadir was 73%. He was then titrated on CPAP. Sleep efficiency was 26.4% with a latency to sleep of 126.5 minutes and wake after sleep onset of 49 minutes with moderate sleep fragmentation noted. He had slow-wave sleep at 15.9%, and REM sleep at 20.6%. Average oxygen saturation was 91%, nadir was 86%. He had severe PLMS with  minimal arousals. Based on his test results are prescribed CPAP therapy for home use.   I reviewed his CPAP compliance data from 10/05/2015 through 11/03/2015 which is a total of 30 days during which time he used his machine 29 days with percent used days greater than 4 hours at 40%, indicating suboptimal compliance with an average usage of 3 hours and 33 minutes for all nights. Residual AHI suboptimal at 10 per hour, leak at times borderline for the 95th percentile at 23.8 L/m on a pressure of 9 cm with EPR of 3.   07/28/2015: He reports snoring, excessive daytime somnolence, difficulty with his sleep, including problems initiating and maintaining sleep. He has tried over-the-counter melatonin, infrequently, does not recall the dose. He has had witnessed apneas per wife he states. 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 He worked for [employer] for over 40 years prior to that. His work hours are 11-1/2 hours each day he works. He has been able to manage this quite well he feels bedtime on  average is 9 to 9:30 PM and rise time is between 5 and 6 AM. He does feel fairly adequately rested when he first wakes up. Sleep is disrupted primarily because of nocturia which is about 3 times each night on average. He denies restless leg symptoms currently but may have had some symptoms years ago. He had a tonsillectomy. He lives with his wife. He has grown children. He has no pets. He does not watch TV in bed. He drinks alcohol very rarely. He quit smoking in the 60s. He drinks no caffeine on a day-to-day basis. He denies morning headaches. He denies memory issues. Recently he pulled his left hamstring muscles as he was trying to walk up stairs and missed a step. Also, about a month ago he took a fall as he was getting dressed. He toppled forward. He landed on his right side. He says he has a cracked rib. His Epworth sleepiness score is 11 out of 24 today, his fatigue score is 28 out of 63.  I reviewed your office note from 07/20/2015, which you kindly included.  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   S/P radiation therapy 04/23/13 - 05/29/14   Prostate/seminal vesicles, external beam 4500 cGy in 25 sessions   Seasonal allergies    Skin cancer    scalp   Small bowel obstruction ([place]) 06/10/2016   Wears glasses     His Past Surgical History Is Significant For: Past Surgical History:  Procedure Laterality Date   BACK SURGERY  2009   COLONOSCOPY W/ BIOPSIES AND POLYPECTOMY     CORONARY STENT INTERVENTION N/A 01/24/2022   Procedure: CORONARY STENT INTERVENTION;  Surgeon: [name], [name], MD;  Location: [facility];   Service: Cardiovascular;  Laterality: N/A;   CYSTOSCOPY W/ URETERAL STENT PLACEMENT Left 05/12/2021   Procedure: CYSTOSCOPY WITH RETROGRADE PYELOGRAM/ LEFT URETERAL STENT PLACEMENT.;  Surgeon: [name], MD;  Location: [facility];  Service: Urology;  Laterality: Left;   HYDROCELE  EXCISION  1963   LEFT HEART CATH AND CORONARY ANGIOGRAPHY N/A 03/14/2019   Procedure: LEFT HEART CATH AND CORONARY ANGIOGRAPHY;  Surgeon: [name], MD;  Location: [facility];  Service: Cardiovascular;  Laterality: N/A;   LEFT HEART CATH AND CORONARY ANGIOGRAPHY N/A 01/24/2022   Procedure: LEFT HEART CATH AND CORONARY ANGIOGRAPHY;  Surgeon: [name], [name], MD;  Location: [facility];  Service: Cardiovascular;  Laterality: N/A;   PROSTATE BIOPSY  2013, 2014, 01/2014   Gleason 7   RADIOACTIVE SEED IMPLANT N/A 07/01/2014   Procedure: RADIOACTIVE SEED IMPLANT;  Surgeon: [name], MD;  Location: [facility];  Service: Urology;  Laterality: N/A;  DR PORTABLE   TONSILLECTOMY     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   Procedure: SHUNT INSERTION VENTRICULAR-PERITONEAL;  Surgeon: [name], MD;  Location: [facility];  Service: Neurosurgery;  Laterality: N/A;  SHUNT INSERTION VENTRICULAR-PERITONEAL    His Family History Is Significant For: Family History  Problem Relation Age of Onset   Heart attack Father 54   Cancer Father        prostate   Diabetes Father    Cancer Paternal Uncle        prostate   Dementia Mother 36   Lung cancer Brother     His Social History Is Significant For: Social History   Socioeconomic History   Marital status: Married    Spouse name: Not on file   Number of children: 2   Years of education: Not on file   Highest education level: Not on file  Occupational History   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  Tobacco Use   Smoking status: Former    Types: Cigarettes    Quit date: 03/10/1961    Years since quitting: 60.9    Smokeless tobacco: Never  Vaping Use   Vaping Use: Never used  Substance and Sexual Activity   Alcohol use: Yes    Alcohol/week: 1.0 standard drink of alcohol    Types: 1 Glasses of wine per week    Comment: rare alcohol   Drug use: No   Sexual activity: Not on file  Other Topics Concern   Not on file  Social History Narrative   Rare caffeine use    Social Determinants of Health   Financial Resource Strain: Not on file  Food Insecurity: No Food Insecurity (02/06/2022)   Hunger Vital Sign    Worried About Running Out of Food in the Last Year: Never true    Ran Out of Food in the Last Year: Never true  Transportation Needs: No Transportation Needs (02/06/2022)   PRAPARE - Hydrologist (Medical): No    Lack of Transportation (Non-Medical): No  Physical Activity: Unknown (06/13/2018)   Exercise Vital Sign    Days of Exercise per Week: Patient refused    Minutes of Exercise per Session: Patient refused  Stress: No Stress Concern Present (06/13/2018)   [place]    Feeling of Stress : Only a little  Social Connections: Unknown (06/13/2018)   Social Connection and Isolation Panel [NHANES]    Frequency of Communication with Friends and Family: More than three times a week    Frequency of Social Gatherings with Friends and Family: More than three times a week    Attends Religious Services: Not on file    Active Member of Clubs or Organizations: Not  on file    Attends Club or Organization Meetings: Not on file    Marital Status: Not on file    His Allergies Are:  Allergies  Allergen Reactions   Lipitor [Atorvastatin] Other (See Comments)    Hip pain  :   His Current Medications Are:  Outpatient Encounter Medications as of 02/08/2022  Medication Sig   acetaminophen (TYLENOL) 500 MG tablet Take 1 tablet (500 mg total) by mouth every 6 (six) hours as needed for mild pain or moderate  pain.   aspirin EC 81 MG tablet Take 81 mg by mouth daily.    carvedilol (COREG) 12.5 MG tablet Take 1 tablet (12.5 mg total) by mouth 2 (two) times daily.   cetirizine (ZYRTEC) 10 MG tablet Take 10 mg by mouth daily.   cholecalciferol (VITAMIN D3) 25 MCG (1000 UNIT) tablet Take 1,000 Units by mouth daily.   clopidogrel (PLAVIX) 75 MG tablet Take 1 tablet (75 mg total) by mouth daily with breakfast.   Cyanocobalamin (B-12 PO) Take by mouth.   dapagliflozin propanediol (FARXIGA) 10 MG TABS tablet Take 1 tablet (10 mg total) by mouth daily before breakfast.   ezetimibe (ZETIA) 10 MG tablet Take 10 mg by mouth daily.   meclizine (ANTIVERT) 25 MG tablet Take 1 tablet (25 mg total) by mouth 3 (three) times daily as needed for dizziness.   Melatonin 10 MG TABS Take 10 mg by mouth at bedtime as needed (sleep).   metFORMIN (GLUCOPHAGE-XR) 500 MG 24 hr tablet Take 500 mg by mouth daily.   pantoprazole (PROTONIX) 20 MG tablet Take 20 mg by mouth daily before breakfast.    rosuvastatin (CRESTOR) 10 MG tablet Take 1 tablet (10 mg total) by mouth daily.   tamsulosin (FLOMAX) 0.4 MG CAPS capsule Take 1 capsule (0.4 mg total) by mouth daily after supper.   vitamin B-12 (CYANOCOBALAMIN) 500 MCG tablet Take 500 mcg by mouth daily.   No facility-administered encounter medications on file as of 02/08/2022.  :   Review of Systems:  Out of a complete 14 point review of systems, all are reviewed and negative with the exception of these symptoms as listed below:   Review of Systems  Neurological:        Pt here for sleep consult  Pt fatigue ,hypertension Pt denies snoring,headaches. Last sleep study in 2017 and son states last time Patient wore CPAP was 2020. Pt is on  Continuous oxygen 2L   ESS:11 FSS:33    Objective:  Neurological Exam  Physical Exam Physical Examination:   Vitals:   02/08/22 1235  BP: 127/83  Pulse: 77    General Examination: The patient is a very pleasant 81 y.o. male in no  acute distress. He appears frail and deconditioned, oxygen via nasal cannula at 2 L per per minute via portable concentrator.   HEENT: Normocephalic, atraumatic, pupils are equal, round and reactive to light.  and accommodation. Funduscopic exam is normal with sharp disc margins noted. Extraocular tracking is good without limitation to gaze excursion or nystagmus noted. Normal smooth pursuit is noted. Hearing is grossly intact. Face is symmetric with normal facial animation and normal facial sensation. Speech is clear with no dysarthria noted. There is no hypophonia. There is no lip, neck/head, jaw or voice tremor. Neck is supple with full range of passive and active motion. There are no carotid bruits on auscultation. Oropharynx exam reveals: mild mouth dryness, adequate dental hygiene and moderate airway crowding, due to small airway entry. He  has a small mouth opening as well. He has a larger uvula. Tonsils are absent. Tongue is normal in size. Mallampati is class II. Tongue protrudes centrally and palate elevates symmetrically. Neck size is 16 1/8 inches. He has a Mild overbite.     Chest: Clear to auscultation without wheezing, rhonchi or crackles noted.   Heart: S1+S2+0, regular and normal without murmurs, rubs or gallops noted.    Abdomen: Soft, non-tender and non-distended.   Extremities: There is no obvious swelling.    Skin: dry appearing with chronic appearing changes.   Musculoskeletal: exam reveals no obvious joint deformities.    Neurologically:   Mental status: The patient is awake, alert and oriented in all 4 spheres. His immediate and remote memory, attention, language skills and fund of knowledge are appropriate. There is no evidence of aphasia, agnosia, apraxia or anomia. Speech is clear with normal prosody and enunciation. Thought process is linear. Mood is normal and affect is normal.   Cranial nerves II - XII are as described above under HEENT exam. Motor exam: Normal bulk,  strength and tone is noted. There is no obvious tremor.  Fine motor skills and coordination: Grossly intact.  Cerebellar testing: No dysmetria or intention tremor. There is no truncal or gait ataxia.  Sensory exam: intact to light touch.  Gait, station and balance: He stands with difficulty.  Walks slowly.  No walking aid.    Assessment and Plan:  In summary, GIBRAN VESELKA is an 81 year old right-handed gentleman with an underlying complex medical history of hypertension, hyperlipidemia, BPH, central retinal artery occlusion, osteoarthritis, prostate cancer, status postradiation treatment, allergies, small bowel obstruction, diabetes, diverticulosis, kidney stones, tremor and gait disorder (seen by Dr. 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  He was diagnosed with severe sleep apnea in 2017 but has not used his CPAP for the past 3 years or more.  He is currently on supplemental oxygen 24-7 since his recent hospitalization for respiratory failure and pneumonia.  He also had a recent coronary stent placed in early September 2023.  He is advised to return for an overnight sleep study and we will reestablish treatment hopefully with CPAP with or without supplemental oxygen.  His son reports that they recently received a new machine less than a week ago.  We tried to called [facility] but they had no records of a new machine issued.  We will have the patient bring in his machine for download at the next opportunity.  We will plan a follow-up after sleep testing.  I answered all their questions today and the patient and her son were in agreement.  Thank you very much for allowing me to participate in the care of this nice patient. If I can be of any further assistance to you please do not hesitate to call me at  [phone].  Sincerely,   Star Age, MD, PhD  This was an extended visit of over 1 hour due to extended chart review.

## 2022-02-08 NOTE — Patient Instructions (Signed)
We will schedule another sleep study. Please bring your current CPAP machine to our clinic at the next possible opportunity, as you have a new machine so we can get data off of it. [facility] did not have any records that you received a new machine.

## 2022-02-11 DIAGNOSIS — J9601 Acute respiratory failure with hypoxia: Secondary | ICD-10-CM | POA: Diagnosis not present

## 2022-02-13 ENCOUNTER — Institutional Professional Consult (permissible substitution): Payer: Medicare Other | Admitting: Neurology

## 2022-02-13 ENCOUNTER — Ambulatory Visit: Payer: Self-pay

## 2022-02-13 ENCOUNTER — Ambulatory Visit: Payer: Medicare Other | Attending: Physician Assistant | Admitting: Physician Assistant

## 2022-02-13 VITALS — BP 138/72 | HR 75 | Ht 70.0 in | Wt 193.4 lb

## 2022-02-13 DIAGNOSIS — I10 Essential (primary) hypertension: Secondary | ICD-10-CM

## 2022-02-13 DIAGNOSIS — E785 Hyperlipidemia, unspecified: Secondary | ICD-10-CM

## 2022-02-13 DIAGNOSIS — I2510 Atherosclerotic heart disease of native coronary artery without angina pectoris: Secondary | ICD-10-CM | POA: Diagnosis not present

## 2022-02-13 DIAGNOSIS — J9611 Chronic respiratory failure with hypoxia: Secondary | ICD-10-CM | POA: Diagnosis not present

## 2022-02-13 DIAGNOSIS — Z9981 Dependence on supplemental oxygen: Secondary | ICD-10-CM

## 2022-02-13 MED ORDER — CLOPIDOGREL BISULFATE 75 MG PO TABS: 75.0000 mg | ORAL_TABLET | Freq: Every day | ORAL | 2 refills | Status: DC

## 2022-02-13 NOTE — Progress Notes (Unsigned)
Cardiology Office Note:    Date:  02/16/2022   ID:  POSEIDON PAM, DOB 1940/10/05, MRN [id number]  PCP:  [name], [place] Prov[facility] Cardiologist:  Peter [name], MD     Referring MD: [name], MD   Chief Complaint  Patient presents with   Follow-up    Seen for Dr. [name]    History of Present Illness:    PRECIOUS GILCHREST is a 81 y.o. male with a hx of normal pressure hydrocephalus s/p VP shunt, chronic respiratory failure on home O2, hypertension, hyperlipidemia, and DM2.  Patient has a history of nonobstructive CAD on previous cath in 2020 that showed minimal 20% disease in proximal and distal RCA and mid LAD.  Echocardiogram in 2021 showed EF 60 to 65%, no regional wall motion abnormality, moderately reduced RV systolic function, mildly elevated pulmonary artery systolic pressure.  Patient was seen in the hospital on 01/21/2022 for evaluation of chest pain.  Troponin was mildly elevated, however patient had uncontrolled blood pressure in the 190s.  Troponin eventually peaked at 823 before trending back down.  Echocardiogram on 01/21/2022 showed EF 55 to 60%, hypokinesis of the mid RV with relative apical sparing, mild MR, mild to moderate AI, mildly dilated aortic root.  Subsequent cardiac catheterization performed on 01/24/2022 showed 50% proximal LAD, 50% D1, 15% proximal to mid RCA lesion, 90% RPDA lesion treated with Onyx frontier 2.5 x 12 mm DES.  Due to history of intolerance of Lipitor in the past, it was recommended to consider PCSK9 inhibitor as outpatient.  He was discharged on aspirin and Plavix.  Carvedilol increased to 12.5 mg twice a day.  Unfortunately patient returned back to the hospital 2 days after he was discharged with shortness of breath, generalized weakness, lethargy and a nonproductive cough.  O2 saturation was in the 80s on home 2 L oxygen.  CTA negative for PE but showed bilateral lower lobe infiltrate.  He was started on IV antibiotic for  hospital acquired pneumonia.  However after further evaluation, pneumonia was ruled out.  Patient was treated for acute on chronic hypoxic respiratory failure of unclear etiology.  On 9/12, patient developed acute encephalopathy of unclear etiology.  Code stroke was called, extensive imaging was done revealing, EEG showed nonspecific abnormality.  Neurology felt patient had cefepime induced encephalopathy.  Patient presents today accompanied by his daughter.  He is breathing has significantly improved since the recent hospitalization however still not back at his baseline yet.  His mental status has also improved as well.  He denies any significant chest pain.  He has been compliant with aspirin and Plavix.  Emphasis has been placed on the importance of dual antiplatelet therapy.  I gave him another 9 months prescription of Plavix since he already picked up a 14-monthsupply.  He is aware he will need to continue dual antiplatelet therapy for minimum of 1 year.  He is currently on Crestor.  He will need a repeat fasting lipid panel and LFT in 4 weeks.  If LDL is still elevated, I will refer him to lipid clinic to consider PCSK9 inhibitor.  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   S/P radiation therapy 04/23/13 - 05/29/14   Prostate/seminal vesicles, external beam 4500 cGy in 25 sessions   Seasonal allergies    Skin cancer    scalp   Small bowel obstruction ([place]) 06/10/2016   Wears glasses     Past Surgical History:  Procedure Laterality Date   BACK SURGERY  2009   COLONOSCOPY W/ BIOPSIES AND POLYPECTOMY     CORONARY STENT INTERVENTION N/A 01/24/2022   Procedure: CORONARY  STENT INTERVENTION;  Surgeon: [name], [name], MD;  Location: [facility];  Service: Cardiovascular;  Laterality: N/A;   CYSTOSCOPY W/ URETERAL STENT PLACEMENT Left 05/12/2021   Procedure: CYSTOSCOPY WITH RETROGRADE PYELOGRAM/ LEFT URETERAL STENT PLACEMENT.;  Surgeon: [name], MD;  Location: [facility];  Service: Urology;  Laterality: Left;   [place]   LEFT HEART CATH AND CORONARY ANGIOGRAPHY N/A 03/14/2019   Procedure: LEFT HEART CATH AND CORONARY ANGIOGRAPHY;  Surgeon: [name], MD;  Location: [facility];  Service: Cardiovascular;  Laterality: N/A;   LEFT HEART CATH AND CORONARY ANGIOGRAPHY N/A 01/24/2022   Procedure: LEFT HEART CATH AND CORONARY ANGIOGRAPHY;  Surgeon: [name], [name], MD;  Location: [facility];  Service: Cardiovascular;  Laterality: N/A;   PROSTATE BIOPSY  2013, 2014, 01/2014   Gleason 7   RADIOACTIVE SEED IMPLANT N/A 07/01/2014   Procedure: RADIOACTIVE SEED IMPLANT;  Surgeon: [name], MD;  Location: [facility];  Service: Urology;  Laterality: N/A;  DR PORTABLE   TONSILLECTOMY     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   Procedure: SHUNT INSERTION VENTRICULAR-PERITONEAL;  Surgeon: [name], MD;  Location: [facility];  Service: Neurosurgery;  Laterality: N/A;  SHUNT INSERTION VENTRICULAR-PERITONEAL    Current Medications: Current Meds  Medication Sig   acetaminophen (TYLENOL) 500 MG tablet Take 1 tablet (500 mg total) by mouth every 6 (six) hours as needed for mild pain or moderate pain.   aspirin EC 81 MG tablet Take 81 mg by mouth daily.    carvedilol (COREG) 12.5 MG tablet Take 1 tablet (12.5 mg total) by mouth 2 (two) times daily.   cetirizine (ZYRTEC) 10 MG tablet Take 10 mg by mouth daily.   cholecalciferol (VITAMIN D3) 25 MCG (1000 UNIT) tablet Take 1,000 Units by mouth daily.   Cyanocobalamin (B-12 PO) Take by mouth.   dapagliflozin propanediol (FARXIGA) 10 MG TABS tablet Take 1 tablet  (10 mg total) by mouth daily before breakfast.   ezetimibe (ZETIA) 10 MG tablet Take 10 mg by mouth daily.   meclizine (ANTIVERT) 25 MG tablet Take 1 tablet (25 mg total) by mouth 3 (three) times daily as needed for dizziness.   Melatonin 10 MG TABS Take 10 mg by mouth at bedtime as needed (sleep).   metFORMIN (GLUCOPHAGE-XR) 500 MG 24 hr tablet Take 500 mg by mouth daily.   pantoprazole (PROTONIX) 20 MG tablet Take 20 mg by mouth daily before breakfast.    rosuvastatin (CRESTOR) 10 MG tablet Take 1 tablet (10 mg total) by mouth daily.   tamsulosin (FLOMAX) 0.4 MG CAPS capsule Take 1 capsule (0.4 mg total) by mouth daily after supper.   vitamin B-12 (CYANOCOBALAMIN) 500 MCG tablet Take 500 mcg by mouth daily.   [DISCONTINUED] clopidogrel (PLAVIX) 75 MG tablet Take 1 tablet (75 mg total) by mouth daily with breakfast.  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  Tobacco Use   Smoking status: Former    Types: Cigarettes    Quit date: 03/10/1961    Years since quitting: 60.9   Smokeless tobacco: Never  Vaping Use   Vaping Use: Never used  Substance and Sexual Activity   Alcohol use: Yes    Alcohol/week: 1.0 standard drink of alcohol    Types: 1 Glasses of wine per week    Comment: rare alcohol   Drug use: No   Sexual activity: Not on file  Other Topics Concern   Not on file  Social History Narrative   Rare caffeine use    Social Determinants of Health   Financial Resource Strain: Not on file  Food Insecurity: No Food Insecurity (02/06/2022)   Hunger Vital Sign    Worried About Running Out of Food in the Last Year: Never true    Ran Out of Food in the Last Year: Never true  Transportation Needs: No  Transportation Needs (02/13/2022)   PRAPARE - Hydrologist (Medical): No    Lack of Transportation (Non-Medical): No  Physical Activity: Unknown (06/13/2018)   Exercise Vital Sign    Days of Exercise per Week: Patient refused    Minutes of Exercise per Session: Patient refused  Stress: No Stress Concern Present (06/13/2018)   [place]    Feeling of Stress : Only a little  Social Connections: Unknown (06/13/2018)   Social Connection and Isolation Panel [NHANES]    Frequency of Communication with Friends and Family: More than three times a week    Frequency of Social Gatherings with Friends and Family: More than three times a week    Attends Religious Services: Not on Advertising copywriter or Organizations: Not on file    Attends Archivist Meetings: Not on file    Marital Status: Not on file     Family History: The patient's family history includes Cancer in his father and paternal uncle; Dementia (age of onset: 68) in his mother; Diabetes in his father; Heart attack (age of onset: 64) in his father; Lung cancer in his brother.  ROS:   Please see the history of present illness.     All other systems reviewed and are negative.  EKGs/Labs/Other Studies Reviewed:    The following studies were reviewed today:  Echo 01/21/2022  1. Left ventricular ejection fraction, by estimation, is 55 to 60%. The  left ventricle has normal function. Left ventricular endocardial border  not optimally defined to evaluate regional wall motion. Left ventricular  diastolic parameters are  indeterminate.   2. Right ventricular systolic function is mildly reduced with hypokinesis  of the mid ventricle and relative apical sparing. The right ventricular  size is mildly enlarged. IVC not assessed, cannot calculate RVSP.   3. Dilated coronary sinus.   4. The mitral valve is normal in structure. Mild  mitral valve  regurgitation. No evidence of mitral stenosis.   5. The aortic valve is tricuspid. Aortic valve regurgitation is mild to  moderate. No aortic stenosis is present.  6. Aortic dilatation noted. There is mild dilatation of the aortic root,  measuring 42 mm.    Cath 01/24/2022   Prox LAD lesion is 50% stenosed.   1st Diag lesion is 50% stenosed.   Prox RCA to Mid RCA lesion is 15% stenosed.   RPDA lesion is 90% stenosed.   A drug-eluting stent was successfully placed using a STENT ONYX FRONTIER 2.5X12.   Post intervention, there is a 0% residual stenosis.   LV end diastolic pressure is normal.   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 Anticipate DC in am   EKG:  EKG is ordered today.  The ekg ordered today demonstrates normal sinus rhythm, right bundle branch block  Recent Labs: 08/12/2021: B Natriuretic Peptide 40.1 02/01/2022: ALT 10; BUN 28; Creatinine, Ser 1.28; Hemoglobin 14.4; Magnesium 1.9; Platelets 179; Potassium 4.2; Sodium 145  Recent Lipid Panel    Component Value Date/Time   CHOL 190 01/21/2022 0200   TRIG 264 (H) 01/21/2022 0200   HDL 40 (L) 01/21/2022 0200   CHOLHDL 4.8 01/21/2022 0200   VLDL 53 (H) 01/21/2022 0200   LDLCALC 97 01/21/2022 0200     Risk Assessment/Calculations:           Physical Exam:    VS:  BP 138/72   Pulse 75   Ht '5\' 10"'$  (1.778 m)   Wt 193 lb 6.4 oz (87.7 kg)   SpO2 94%   BMI 27.75 kg/m        Wt Readings from Last 3 Encounters:  02/13/22 193 lb 6.4 oz (87.7 kg)  02/08/22 192 lb (87.1 kg)  02/01/22 190 lb 14.7 oz (86.6 kg)     GEN:  Well nourished, well developed in no acute distress HEENT: Normal NECK: No JVD; No carotid bruits LYMPHATICS: No lymphadenopathy CARDIAC: RRR, no murmurs, rubs, gallops RESPIRATORY:  Clear to auscultation without rales, wheezing or rhonchi  ABDOMEN: Soft, non-tender,  non-distended MUSCULOSKELETAL:  No edema; No deformity  SKIN: Warm and dry NEUROLOGIC:  Alert and oriented x 3 PSYCHIATRIC:  Normal affect   ASSESSMENT:    1. Coronary artery disease involving native coronary artery of native heart without angina pectoris   2. Hyperlipidemia LDL goal <70   3. Primary hypertension   4. Chronic respiratory failure with hypoxia, on home O2 therapy (HCC) - 2 L/min prn per patient    PLAN:    In order of problems listed above:  CAD: Denies any recent chest discomfort.  Underwent DES to RPDA with 2.5 x 12 mm Onyx frontier drug-eluting stent on 01/24/2022.  He has been compliant with dual antiplatelet therapy.  Hyperlipidemia: Continue on Crestor.  Repeat fasting lipid panel and LFT in 4 weeks, if LDL remains elevated greater than 70, I will refer him to lipid clinic to consider PCSK9 inhibitor.  Hypertension: Blood pressure stable  Chronic respiratory failure: On home oxygen.  Shortly after recent discharge after stent placement, he was readmitted with acute on chronic respiratory failure.  Etiology behind respiratory failure was unclear.  Fortunately he has significantly improved.    Cardiac Rehabilitation Eligibility Assessment  The patient is ready to start cardiac rehabilitation from a cardiac standpoint.          Medication Adjustments/Labs and Tests Ordered: Current medicines are reviewed at length with the patient today.  Concerns regarding medicines are outlined above.  Orders Placed This Encounter  Procedures   Lipid Profile   Hepatic function panel   EKG 12-Lead   Meds ordered this encounter  Medications   clopidogrel (PLAVIX) 75 MG tablet    Sig: Take 1 tablet (75 mg total) by mouth daily with breakfast.    Dispense:  90 tablet    Refill:  2    Patient Instructions  Medication Instructions:  Your physician recommends that you continue on your current medications as directed. Please refer to the Current Medication list given to  you today.  *If you need a refill on your cardiac medications before your next appointment, please call your pharmacy*   Lab Work: Your physician recommends that you return I'm 4 weeks to have the following labs drawn: Lipid and LFT's  If you have labs (blood work) drawn today and your tests are completely normal, you will receive your results only by: [place] (if you have MyChart) OR A paper copy in the mail If you have any lab test that is abnormal or we need to change your treatment, we will call you to review the results.   Testing/Procedures: NONE   Follow-Up: At [facility], you and your health needs are our priority.  As part of our continuing mission to provide you with exceptional heart care, we have created designated Provider Care Teams.  These Care Teams include your primary Cardiologist (physician) and Advanced Practice Providers (APPs -  Physician Assistants and Nurse Practitioners) who all work together to provide you with the care you need, when you need it.  We recommend signing up for the patient portal called "MyChart".  Sign up information is provided on this After Visit Summary.  MyChart is used to connect with patients for Virtual Visits (Telemedicine).  Patients are able to view lab/test results, encounter notes, upcoming appointments, etc.  Non-urgent messages can be sent to your provider as well.   To learn more about what you can do with MyChart, go to [email or web address].    Your next appointment:   3-4 month(s)  The format for your next appointment:   In Person  Provider:   Peter [name], MD     Signed, Almyra Deforest, [place]  02/16/2022 12:00 AM    [place]

## 2022-02-13 NOTE — Progress Notes (Addendum)
RE: received machine one week ago Received: 3 days ago [place], Jeremiah Rosenthal, RN; [name]; [place], [name]; [name], [name],   As for the cpap, On 02-03-22 the patients daughter advised that patient has pap unit that is 82 years old and has mold and needs to be cleaned. We advised order was for new pap unit and she didn't want to get new machine at that time so a loner unit was provided until she speaks to the DR.  Sr # for new unit is [id number]  Ordering DM was Dr Star Age   Thanks,  Brad New  New, [name], [place]; [name], RN; [name] [name], [place] I already replied to this. See message. another MD write and cpap order for this. Thanks!      Previous Messages    ----- Message -----  From: [name]  Sent: 02/10/2022   9:21 AM EDT  To: [name]; Miquel Dunn; Stephannie Peters; *  Subject: RE: received machine one week ago               [facility],  It looks like he just got a loaner machine from [place] recently.  Dr. Rexene Alberts ordered his original unit back in 2017. Are y'all sending orders for a replacement machine?   Call me if you need to. I'm not able to get into Epic very often so calls or texts are best way to get me.  My Epic team can also get a message to me if that's easier.   Thank you!  Lenna Sciara  [phone]   ----- Message -----  From: [name], RN  Sent: 02/09/2022   6:01 PM EDT  To: [name]; Miquel Dunn; Stephannie Peters; *  Subject: received machine one week ago                   Early Chars. Riederer  Male, 81 y.o., 1940/10/10  Pronouns:  he/him/his  MRN:  [id number]    [name] checking on this pt whom we saw 02-08-2022.  The son was with the patient and went to [employer] location (speaking with Crystal) and got a new machine.  He did not bring to his appt, so we could not get serial #.  I spoke to customer service and there was no information  about a new machine.  Can you check on this for me.  He came to [place] as a new patient, for osa ).   not sure who ordered the other machine,   Let me know when you get a chance.  I have not had a chance to speak to daughter or son today..  we are out until Monday.Lovey Newcomer Rn

## 2022-02-13 NOTE — Patient Instructions (Signed)
Visit Information  Thank you for taking time to visit with me today. Please don't hesitate to contact me if I can be of assistance to you.   Following are the goals we discussed today:   Goals Addressed             This Visit's Progress    My dad is not using his CPAP machine       Care Coordination Interventions: Evaluation of current treatment plan related to OSA with CPAP use and patient's adherence to plan as established by provider Provided education to daughter re: established plan of care per Dr. Rexene Alberts, Neurologist;  Patient should bring CPAP machine for download of usage  Dr. Rexene Alberts is recommending a new sleep study be completed, advised daughter [name] about referral process and next steps Social Work referral for resources for help with medical transportation and emergency alert system            Our next appointment is by telephone on 03/06/22 at 0900 AM  Please call the care guide team at [phone] if you need to cancel or reschedule your appointment.   If you are experiencing a Mental Health or [place] or need someone to talk to, please call 1-800-273-TALK (toll free, 24 hour hotline)  Patient verbalizes understanding of instructions and care plan provided today and agrees to view in [place]. Active MyChart status and patient understanding of how to access instructions and care plan via MyChart confirmed with patient.     Barb Merino, RN, BSN, CCM Care Management Coordinator [place] [facility] Direct Phone: [phone]

## 2022-02-13 NOTE — Patient Instructions (Signed)
Medication Instructions:  Your physician recommends that you continue on your current medications as directed. Please refer to the Current Medication list given to you today.  *If you need a refill on your cardiac medications before your next appointment, please call your pharmacy*   Lab Work: Your physician recommends that you return I'm 4 weeks to have the following labs drawn: Lipid and LFT's  If you have labs (blood work) drawn today and your tests are completely normal, you will receive your results only by: [place] (if you have MyChart) OR A paper copy in the mail If you have any lab test that is abnormal or we need to change your treatment, we will call you to review the results.   Testing/Procedures: NONE   Follow-Up: At [facility], you and your health needs are our priority.  As part of our continuing mission to provide you with exceptional heart care, we have created designated Provider Care Teams.  These Care Teams include your primary Cardiologist (physician) and Advanced Practice Providers (APPs -  Physician Assistants and Nurse Practitioners) who all work together to provide you with the care you need, when you need it.  We recommend signing up for the patient portal called "MyChart".  Sign up information is provided on this After Visit Summary.  MyChart is used to connect with patients for Virtual Visits (Telemedicine).  Patients are able to view lab/test results, encounter notes, upcoming appointments, etc.  Non-urgent messages can be sent to your provider as well.   To learn more about what you can do with MyChart, go to [email or web address].    Your next appointment:   3-4 month(s)  The format for your next appointment:   In Person  Provider:   Peter [name], MD

## 2022-02-13 NOTE — Patient Outreach (Signed)
  Care Coordination   Initial Visit Note   02/13/2022 Name: BORDEN THUNE MRN: [id number] DOB: December 04, 1940  DINK CREPS is a 81 y.o. year old male who sees [name], MD for primary care. I spoke with patient's daughter [name] by phone today.  What matters to the patients health and wellness today?  Patient is not wearing his CPAP. He will undergo a new sleep study.     Goals Addressed             This Visit's Progress    My dad is not using his CPAP machine       Care Coordination Interventions: Evaluation of current treatment plan related to OSA with CPAP use and patient's adherence to plan as established by provider Provided education to daughter re: established plan of care per Dr. Rexene Alberts, Neurologist;  Patient should bring CPAP machine for download of usage  Dr. Rexene Alberts is recommending a new sleep study be completed, advised daughter [name] about referral process and next steps Social Work referral for resources for help with medical transportation and emergency alert system            SDOH assessments and interventions completed:  Yes  SDOH Interventions Today    Flowsheet Row Most Recent Value  SDOH Interventions   Transportation Interventions Other (Comment)  [dtr would like resources for medical transportation as a back up, SW referral sent]        Care Coordination Interventions Activated:  Yes  Care Coordination Interventions:  Yes, provided   Follow up plan: Referral made to [facility]  Follow up call scheduled for 03/06/22 '@09'$ :00 AM     Encounter Outcome:  Pt. Visit Completed

## 2022-02-14 DIAGNOSIS — I5032 Chronic diastolic (congestive) heart failure: Secondary | ICD-10-CM | POA: Diagnosis not present

## 2022-02-14 DIAGNOSIS — I2510 Atherosclerotic heart disease of native coronary artery without angina pectoris: Secondary | ICD-10-CM | POA: Diagnosis not present

## 2022-02-14 DIAGNOSIS — J9620 Acute and chronic respiratory failure, unspecified whether with hypoxia or hypercapnia: Secondary | ICD-10-CM | POA: Diagnosis not present

## 2022-02-14 DIAGNOSIS — G929 Unspecified toxic encephalopathy: Secondary | ICD-10-CM | POA: Diagnosis not present

## 2022-02-15 ENCOUNTER — Encounter: Payer: Self-pay | Admitting: Physician Assistant

## 2022-02-15 ENCOUNTER — Ambulatory Visit: Payer: Self-pay

## 2022-02-15 NOTE — Patient Instructions (Signed)
Visit Information  Thank you for taking time to visit with me today. Please don't hesitate to contact me if I can be of assistance to you.   Following are the goals we discussed today:   Goals Addressed             This Visit's Progress    COMPLETED: Care Coordination Activities       Care Coordination Interventions: Collaboration with RN Care Manager who requests SW assistance with transportation resources as well as emergency response system Determined the patient was seen by his cardiologist yesterday and is clear to begin driving again. Patient also has a caregiver three days a week who assists with transportation when needed Discussed the patient may benefit from a medical alert system Education provided on the [place], flyer emailed to daughter         If you are experiencing a Mental Health or [place] or need someone to talk to, please call 1-800-273-TALK (toll free, 24 hour hotline)  Patient verbalizes understanding of instructions and care plan provided today and agrees to view in [place]. Active MyChart status and patient understanding of how to access instructions and care plan via MyChart confirmed with patient.     No further follow up required: Please contact me as needed.  Daneen Schick, BSW, CDP Social Worker, Certified Dementia Practitioner [place] [facility]  Care Coordination [phone]

## 2022-02-15 NOTE — Patient Outreach (Signed)
  Care Coordination   Follow Up Visit Note   02/15/2022 Name: Jeremiah [name] MRN: [id number] DOB: 25-Jun-1940  NEDIM OKI is a 81 y.o. year old male who sees [name], MD for primary care. I  spoke with patients daughter and caregiver Jeremiah [name] by phone.  What matters to the patients health and wellness today?  To learn more about a life alert system    Goals Addressed             This Visit's Progress    COMPLETED: Care Coordination Activities       Care Coordination Interventions: Collaboration with RN Care Manager who requests SW assistance with transportation resources as well as emergency response system Determined the patient was seen by his cardiologist yesterday and is clear to begin driving again. Patient also has a caregiver three days a week who assists with transportation when needed Discussed the patient may benefit from a medical alert system Education provided on the [facility], flyer emailed to daughter         SDOH assessments and interventions completed:  No     Care Coordination Interventions Activated:  Yes  Care Coordination Interventions:  Yes, provided   Follow up plan: No further intervention required. Patient to contact me as needed.  Encounter Outcome:  Pt. Visit Completed   Daneen Schick, BSW, CDP Social Worker, Certified Dementia Practitioner [facility]  Care Coordination [phone]

## 2022-02-21 ENCOUNTER — Ambulatory Visit: Payer: Self-pay

## 2022-02-21 DIAGNOSIS — R413 Other amnesia: Secondary | ICD-10-CM | POA: Diagnosis not present

## 2022-02-21 DIAGNOSIS — G4710 Hypersomnia, unspecified: Secondary | ICD-10-CM | POA: Diagnosis not present

## 2022-02-21 DIAGNOSIS — J9620 Acute and chronic respiratory failure, unspecified whether with hypoxia or hypercapnia: Secondary | ICD-10-CM | POA: Diagnosis not present

## 2022-02-21 DIAGNOSIS — G4733 Obstructive sleep apnea (adult) (pediatric): Secondary | ICD-10-CM | POA: Diagnosis not present

## 2022-02-21 NOTE — Patient Instructions (Signed)
Visit Information  Thank you for taking time to visit with me today. Please don't hesitate to contact me if I can be of assistance to you.   Following are the goals we discussed today:   Goals Addressed             This Visit's Progress    My dad is not using his CPAP machine       Care Coordination Interventions: Received an internal update regarding patient's call to [facility] after hours nurse line, patient reported concerns re: increased daytime sleepiness and change in his Oxygen levels Evaluation of current treatment plan related to OSA with CPAP use and patient's adherence to plan as established by provider Determined patient is not using his CPAP machine Educated patient about the basic disease process related to OSA including treatment and complications Determined patient is scheduled to see PCP, Dr. Joylene Draft on 10/4 '@4PM'$ , he will drive himself to this appointment Determined patient contacted his DME provider to discuss alarms on his oxygen concentrator, his questions were answered, patient states his Oxygen concentrator is working properly Determined patient is using continuous Oxygen at 2 lts as directed, he reports his oxygen levels are running consistently around 95% Placed outbound call to Dr. Rexene Alberts, Neurologist to determine that patient's sleep study has been ordered by Dr. Rexene Alberts and is pending insurance authorization           Our next appointment is by telephone on 03/06/22 at 0900 AM  Please call the care guide team at [phone] if you need to cancel or reschedule your appointment.   If you are experiencing a Mental Health or [place] or need someone to talk to, please call 1-800-273-TALK (toll free, 24 hour hotline)  Patient verbalizes understanding of instructions and care plan provided today and agrees to view in [place]. Active MyChart status and patient understanding of how to access instructions and care plan via MyChart confirmed with patient.      Barb Merino, RN, BSN, CCM Care Management Coordinator [facility]  Direct Phone: [phone]

## 2022-02-21 NOTE — Patient Outreach (Signed)
  Care Coordination   Follow Up Visit Note   02/21/2022 Name: Jeremiah [name] MRN: [id number] DOB: 1940/11/18  Jeremiah [name] is a 81 y.o. year old male who sees [name], MD for primary care. I spoke with  Jeremiah [name] by phone today.  What matters to the patients health and wellness today?  Patient is concerned about having excessive daytime sleepiness.     Goals Addressed             This Visit's Progress    My dad is not using his CPAP machine       Care Coordination Interventions: Received an internal update regarding patient's call to [facility] after hours nurse line, patient reported concerns re: increased daytime sleepiness and change in his Oxygen levels Evaluation of current treatment plan related to OSA with CPAP use and patient's adherence to plan as established by provider Determined patient is not using his CPAP machine Educated patient about the basic disease process related to OSA including treatment and complications Determined patient is scheduled to see PCP, Dr. Joylene Draft on 10/4 '@4PM'$ , he will drive himself to this appointment Determined patient contacted his DME provider to discuss alarms on his oxygen concentrator, his questions were answered, patient states his Oxygen concentrator is working properly Determined patient is using continuous Oxygen at 2 lts as directed, he reports his oxygen levels are running consistently around 95% Placed outbound call to Dr. Rexene Alberts, Neurologist to determine that patient's sleep study has been ordered by Dr. Rexene Alberts and is pending insurance authorization           SDOH assessments and interventions completed:  No     Care Coordination Interventions Activated:  Yes  Care Coordination Interventions:  Yes, provided   Follow up plan: Follow up call scheduled for 03/06/22 '@0900'$  AM    Encounter Outcome:  Pt. Visit Completed

## 2022-02-22 ENCOUNTER — Ambulatory Visit: Payer: Medicare Other | Admitting: Student

## 2022-03-05 DIAGNOSIS — J9621 Acute and chronic respiratory failure with hypoxia: Secondary | ICD-10-CM | POA: Diagnosis not present

## 2022-03-05 DIAGNOSIS — I5032 Chronic diastolic (congestive) heart failure: Secondary | ICD-10-CM | POA: Diagnosis not present

## 2022-03-06 ENCOUNTER — Ambulatory Visit: Payer: Self-pay

## 2022-03-06 NOTE — Patient Outreach (Addendum)
  Care Coordination   Follow Up Visit Note   03/06/2022 Name: Jeremiah [name] MRN: [id number] DOB: Dec 06, 1940  Jeremiah [name] is a 81 y.o. year old male who sees [name], MD for primary care. I spoke with  [name] and daughter [name] by phone today.  What matters to the patients health and wellness today?  Patient is awaiting authorization for a repeat sleep study. Patient will start checking his cbg's at home.    Goals Addressed               This Visit's Progress     Patient Stated     I am not checking my blood sugars (pt-stated)        Care Coordination Interventions: Provided education to patient about basic DM disease process Review of patient status, including review of consultants reports, relevant laboratory and other test results, and medications completed Reviewed medications with patient and discussed importance of medication adherence Determined patient has a CNA preparing his medications, he is unsure what he is taking Determined patient is not currently checking his cbg's at home Advised patient, providing education and rationale, to check cbg daily before meals and at bedtime and record, calling PCP for findings outside established parameters Discussed patient will start monitoring his cbg's at home, he will record readings and date/time to review at next scheduled nurse follow up call  Mailed printed educational materials related to Diabetes management         Other     My dad is not using his CPAP machine        Care Coordination Interventions: Evaluation of current treatment plan related to OSA with CPAP use and patient's adherence to plan as established by provider Determined patient continues not to use his CPAP machine Advised patient Dr. Rexene Alberts, Neurologist placed referral for repeat sleep study and is pending insurance authorization  Educated patient regarding OSA left untreated may affect his memory and cause other complications,  discussed his daughter has noticed a decline in his memory and concentration Confirmed patient is using continuous oxygen at 2 lts Reviewed update per recent PCP visit, patient completed walk test maintaining O2 sats >89%, mostly staying at 95-96% Spoke with patient's daughter [name] to learn patient was cleared for Cardiac Rehab, Peter Congo from [place] contacted her about 2 weeks ago to discuss next steps, she has not received a start date and is requesting assistance to check the status Placed outbound call to [place], spoke with Peter Congo who advised patient is on the wait list and patient/daughter will be contacted with a start date as it approaches Placed outbound call to daughter [name] to advise of Cardiac rehab status and next steps           SDOH assessments and interventions completed:  No     Care Coordination Interventions Activated:  Yes  Care Coordination Interventions:  Yes, provided   Follow up plan: Follow up call scheduled for 03/14/22 '@10'$ :30 AM     Encounter Outcome:  Pt. Visit Completed

## 2022-03-06 NOTE — Patient Instructions (Addendum)
Visit Information  Thank you for taking time to visit with me today. Please don't hesitate to contact me if I can be of assistance to you.   Following are the goals we discussed today:   Goals Addressed               This Visit's Progress     Patient Stated     I am not checking my blood sugars (pt-stated)        Care Coordination Interventions: Provided education to patient about basic DM disease process Review of patient status, including review of consultants reports, relevant laboratory and other test results, and medications completed Reviewed medications with patient and discussed importance of medication adherence Determined patient has a CNA preparing his medications, he is unsure what he is taking Determined patient is not currently checking his cbg's at home Advised patient, providing education and rationale, to check cbg daily before meals and at bedtime and record, calling PCP for findings outside established parameters Discussed patient will start monitoring his cbg's at home, he will record readings and date/time to review at next scheduled nurse follow up call  Mailed printed educational materials related to Diabetes management         Other     My dad is not using his CPAP machine        Care Coordination Interventions: Evaluation of current treatment plan related to OSA with CPAP use and patient's adherence to plan as established by provider Determined patient continues not to use his CPAP machine Advised patient Dr. Rexene Alberts, Neurologist placed referral for repeat sleep study and is pending insurance authorization  Educated patient regarding OSA left untreated may affect his memory and cause other complications, discussed his daughter has noticed a decline in his memory and concentration Confirmed patient is using continuous oxygen at 2 lts Reviewed update per recent PCP visit, patient completed walk test maintaining O2 sats >89%, mostly staying at 95-96% Spoke  with patient's daughter [name] to learn patient was cleared for Cardiac Rehab, Peter Congo from [place] contacted her about 2 weeks ago to discuss next steps, she has not received a start date and is requesting assistance to check the status Placed outbound call to [place], spoke with Peter Congo who advised patient is on the wait list and patient/daughter will be contacted with a start date as it approaches Placed outbound call to daughter [name] to advise of Cardiac rehab status and next steps           Our next appointment is by telephone on 03/14/22 at 10:30 AM  Please call the care guide team at [phone] if you need to cancel or reschedule your appointment.   If you are experiencing a Mental Health or [place] or need someone to talk to, please call 1-800-273-TALK (toll free, 24 hour hotline)  Patient verbalizes understanding of instructions and care plan provided today and agrees to view in [place]. Active MyChart status and patient understanding of how to access instructions and care plan via MyChart confirmed with patient.     Barb Merino, RN, BSN, CCM Care Management Coordinator [facility] Direct Phone: [phone]

## 2022-03-10 DIAGNOSIS — J9601 Acute respiratory failure with hypoxia: Secondary | ICD-10-CM | POA: Diagnosis not present

## 2022-03-13 DIAGNOSIS — J9601 Acute respiratory failure with hypoxia: Secondary | ICD-10-CM | POA: Diagnosis not present

## 2022-03-14 ENCOUNTER — Ambulatory Visit: Payer: Self-pay

## 2022-03-14 ENCOUNTER — Other Ambulatory Visit: Payer: Self-pay

## 2022-03-14 DIAGNOSIS — E785 Hyperlipidemia, unspecified: Secondary | ICD-10-CM

## 2022-03-14 NOTE — Patient Instructions (Signed)
Visit Information  Thank you for taking time to visit with me today. Please don't hesitate to contact me if I can be of assistance to you.   Following are the goals we discussed today:   Goals Addressed             This Visit's Progress    Repeat fasting lipid and LFT       Care Coordination Interventions: Provider established cholesterol goals reviewed Spoke with patient who states he is at MD office for labs Per review of Cardiology follow up from 02/13/22 per Almyra Deforest PA, the following plan of care is noted;  "He is currently on Crestor.  He will need a repeat fasting lipid panel and LFT in 4 weeks.  If LDL is still elevated, I will refer him to lipid clinic to consider PCSK9 inhibitor."        Our next appointment is by telephone on 03/29/22 at 12:30 PM  Please call the care guide team at [phone] if you need to cancel or reschedule your appointment.   If you are experiencing a Mental Health or [place] or need someone to talk to, please call 1-800-273-TALK (toll free, 24 hour hotline) go to [facility] [address] [place] [phone])  Patient verbalizes understanding of instructions and care plan provided today and agrees to view in [place]. Active MyChart status and patient understanding of how to access instructions and care plan via MyChart confirmed with patient.     Barb Merino, RN, BSN, CCM Care Management Coordinator [facility]  Direct Phone: [phone]

## 2022-03-14 NOTE — Patient Outreach (Signed)
  Care Coordination   Follow Up Visit Note   03/14/2022 Name: SIGISMUND CROSS MRN: [id number] DOB: 05-Jan-1941  EMETT STAPEL is a 81 y.o. year old male who sees [name], MD for primary care. I spoke with  [name] by phone today.  What matters to the patients health and wellness today?  Patient reports having blood work completed with his PCP this morning.     Goals Addressed             This Visit's Progress    Repeat fasting lipid and LFT       Care Coordination Interventions: Provider established cholesterol goals reviewed Spoke with patient who states he is at MD office for labs Per review of Cardiology follow up from 02/13/22 per Almyra Deforest PA, the following plan of care is noted;  "He is currently on Crestor.  He will need a repeat fasting lipid panel and LFT in 4 weeks.  If LDL is still elevated, I will refer him to lipid clinic to consider PCSK9 inhibitor."        SDOH assessments and interventions completed:  No     Care Coordination Interventions Activated:  Yes  Care Coordination Interventions:  Yes, provided   Follow up plan: Follow up call scheduled for 03/29/22 '@12'$ :30 PM     Encounter Outcome:  Pt. Visit Completed

## 2022-03-15 LAB — HEPATIC FUNCTION PANEL
ALT: 6 IU/L (ref 0–44)
AST: 13 IU/L (ref 0–40)
Albumin: 4.3 g/dL (ref 3.7–4.7)
Alkaline Phosphatase: 61 IU/L (ref 44–121)
Bilirubin Total: 0.5 mg/dL (ref 0.0–1.2)
Bilirubin, Direct: 0.15 mg/dL (ref 0.00–0.40)
Total Protein: 6.1 g/dL (ref 6.0–8.5)

## 2022-03-15 LAB — LIPID PANEL
Chol/HDL Ratio: 3.2 ratio (ref 0.0–5.0)
Cholesterol, Total: 140 mg/dL (ref 100–199)
HDL: 44 mg/dL (ref >39)
LDL Chol Calc (NIH): 66 mg/dL (ref 0–99)
Triglycerides: 181 mg/dL — ABNORMAL HIGH (ref 0–149)
VLDL Cholesterol Cal: 30 mg/dL (ref 5–40)

## 2022-03-27 ENCOUNTER — Telehealth: Payer: Self-pay | Admitting: *Deleted

## 2022-03-27 NOTE — Progress Notes (Signed)
  Care Coordination Note  03/27/2022 Name: DALONTE HARDAGE MRN: [id number] DOB: [birth year]  KAMON FAHR is a 81 y.o. year old male who is a primary care patient of Perini, Elta Guadeloupe, MD and is actively engaged with the care management team. I reached out to [name] by phone today to assist with re-scheduling a follow up visit with the RN Case Manager  Follow up plan: Unsuccessful telephone outreach attempt made. A HIPAA compliant phone message was left for the patient providing contact information and requesting a return call.   [place]  Direct Dial: [phone]

## 2022-03-27 NOTE — Progress Notes (Signed)
  Care Coordination Note  03/27/2022 Name: Jeremiah [name] MRN: [id number] DOB: 01-10-1941  Jeremiah [name] is a 81 y.o. year old male who is a primary care patient of Perini, Elta Guadeloupe, MD and is actively engaged with the care management team. I reached out to [name] by phone today to assist with re-scheduling a follow up visit with the RN Case Manager  Follow up plan: Telephone appointment with care management team member scheduled for:04/11/22  [place]  Direct Dial: [phone]

## 2022-03-28 ENCOUNTER — Encounter (HOSPITAL_COMMUNITY): Payer: Self-pay

## 2022-03-28 ENCOUNTER — Telehealth (HOSPITAL_COMMUNITY): Payer: Self-pay

## 2022-03-28 NOTE — Telephone Encounter (Signed)
Attempted to call patient in regards to Cardiac Rehab - LM on VM Mailed letter 

## 2022-03-29 ENCOUNTER — Telehealth (HOSPITAL_COMMUNITY): Payer: Self-pay

## 2022-03-30 ENCOUNTER — Encounter (HOSPITAL_COMMUNITY)
Admission: RE | Admit: 2022-03-30 | Discharge: 2022-03-30 | Disposition: A | Discharge status: Home / Self Care | Payer: Medicare Other | Source: Ambulatory Visit | Attending: Cardiology | Admitting: Cardiology

## 2022-03-30 ENCOUNTER — Encounter (HOSPITAL_COMMUNITY): Payer: Self-pay

## 2022-03-30 ENCOUNTER — Telehealth (HOSPITAL_COMMUNITY): Payer: Self-pay | Admitting: *Deleted

## 2022-03-30 VITALS — BP 114/70 | HR 70 | Ht 68.5 in | Wt 194.4 lb

## 2022-03-30 DIAGNOSIS — Z955 Presence of coronary angioplasty implant and graft: Secondary | ICD-10-CM | POA: Insufficient documentation

## 2022-03-30 DIAGNOSIS — I214 Non-ST elevation (NSTEMI) myocardial infarction: Secondary | ICD-10-CM | POA: Diagnosis not present

## 2022-03-30 HISTORY — DX: Atherosclerotic heart disease of native coronary artery without angina pectoris: I25.10

## 2022-03-30 NOTE — Progress Notes (Signed)
Cardiac Rehab Medication Review by a Nurse  Does the patient  feel that his/her medications are working for him/her?  YES  Has the patient been experiencing any side effects to the medications prescribed?  NO  Does the patient measure his/her own blood pressure or blood glucose at home?  YES   Does the patient have any problems obtaining medications due to transportation or finances?    NO  Understanding of regimen: poor Understanding of indications: poor Potential of compliance: good    Nurse comments: Jeremiah [name] is taking his medications as prescribed. Jeremiah [name] said that his caregiver helps to fill his medicine box for he is not sure what he actually takes. I called the patient's daughter Jeremiah [name] to review his medications over the phone. Jeremiah [name] says that he checks his CBG sometimes once a week. Jeremiah [name] does not check his blood pressures on a regular basis.    Christa See [facility] RN 03/30/2022 10:59 AM

## 2022-03-30 NOTE — Telephone Encounter (Signed)
-----   Message from [name] [name], MD sent at 03/30/2022 10:21 AM EST ----- Regarding: RE: oxygen use at cardiac rehab OK to use oxygen to keep sats > 94%. Hopefully he may be able to wean off. I will defer referral to pulmonary to his PCP.  Peter [name] MD, [facility]   ----- Message ----- From: Magda Kiel, RN Sent: 03/30/2022   9:48 AM EST To: [name] [name], MD; Luanna Salk, LPN Subject: oxygen use at cardiac rehab                    Good morning Dr [name],  Mr Axel is here for cardiac rehab orientation and is wearing Oxygen at 2l/min his oxygen. Upon assessment he has bibasilar crackles greater on the the right and no peripheral edema. His oxygen saturations are 97% on 2l/min. He is not short of breath today. He was wearing oxygen when he saw Hal in the office for follow up.  Can we use oxygen at 2l/min and increase to 4l/min if needed to keep oxygen saturations greater than 90% with exercise at cardiac rehab?  Would Mr Mazon benefit from see a pulmonologist?  Thanks for your input.  Sincerely, [name] RN Cardiac Rehab

## 2022-03-30 NOTE — Progress Notes (Addendum)
Cardiac Individual Treatment Plan  Patient Details  Name: Jeremiah [name] MRN: [id number] Date of Birth: January 26, 1941 Referring Provider:   Flowsheet Row INTENSIVE CARDIAC REHAB ORIENT from 03/30/2022 in [facility]  Referring Provider Jeremiah [name], M.D.       Initial Encounter Date:  [name] from 03/30/2022 in [facility]  Date 03/30/22       Visit Diagnosis: 01/24/22 NSTEMI  01/24/22 S/P DES RPDA  Patient's Home Medications on Admission:  Current Outpatient Medications:    acetaminophen (TYLENOL) 500 MG tablet, Take 1 tablet (500 mg total) by mouth every 6 (six) hours as needed for mild pain or moderate pain., Disp: 30 tablet, Rfl: 0   aspirin EC 81 MG tablet, Take 81 mg by mouth daily. , Disp: , Rfl:    carvedilol (COREG) 12.5 MG tablet, Take 1 tablet (12.5 mg total) by mouth 2 (two) times daily., Disp: 180 tablet, Rfl: 1   cetirizine (ZYRTEC) 10 MG tablet, Take 10 mg by mouth daily., Disp: , Rfl:    cholecalciferol (VITAMIN D3) 25 MCG (1000 UNIT) tablet, Take 1,000 Units by mouth daily., Disp: , Rfl:    clopidogrel (PLAVIX) 75 MG tablet, Take 1 tablet (75 mg total) by mouth daily with breakfast., Disp: 90 tablet, Rfl: 2   Cyanocobalamin (B-12 PO), Take by mouth., Disp: , Rfl:    dapagliflozin propanediol (FARXIGA) 10 MG TABS tablet, Take 1 tablet (10 mg total) by mouth daily before breakfast., Disp: 30 tablet, Rfl: 11   ezetimibe (ZETIA) 10 MG tablet, Take 10 mg by mouth daily., Disp: , Rfl:    meclizine (ANTIVERT) 25 MG tablet, Take 1 tablet (25 mg total) by mouth 3 (three) times daily as needed for dizziness., Disp: 30 tablet, Rfl: 0   Melatonin 10 MG TABS, Take 10 mg by mouth at bedtime as needed (sleep)., Disp: , Rfl:    metFORMIN (GLUCOPHAGE-XR) 500 MG 24 hr tablet, Take 500 mg by mouth daily., Disp: , Rfl:    pantoprazole  (PROTONIX) 20 MG tablet, Take 20 mg by mouth daily before breakfast. , Disp: , Rfl:    rosuvastatin (CRESTOR) 10 MG tablet, Take 1 tablet (10 mg total) by mouth daily., Disp: 30 tablet, Rfl: 11   tamsulosin (FLOMAX) 0.4 MG CAPS capsule, Take 1 capsule (0.4 mg total) by mouth daily after supper., Disp: 30 capsule, Rfl: 0   vitamin B-12 (CYANOCOBALAMIN) 500 MCG tablet, Take 500 mcg by mouth daily., Disp: , Rfl:   Past Medical History: Past Medical History:  Diagnosis Date   BPH (benign prostatic hyperplasia)    Central retinal artery occlusion    Coronary artery disease    Diabetes mellitus without complication ([place])    diet controlled   Diverticulosis    History of kidney stones    Hyperlipidemia    Hypertension    OSA on CPAP    wears cpap   Osteoarthritis    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   S/P radiation therapy 04/23/13 - 05/29/14   Prostate/seminal vesicles, external beam 4500 cGy in 25 sessions   Seasonal allergies    Skin cancer    scalp   Small bowel obstruction ([place]) 06/10/2016   Wears glasses     Tobacco Use: Social History   Tobacco Use  Smoking Status Former   Types: Cigarettes   Quit  date: 03/10/1961   Years since quitting: 61.0  Smokeless Tobacco Never    Labs: Review Flowsheet  More data exists      Latest Ref Rng & Units 05/28/2021 01/21/2022 01/27/2022 01/31/2022 03/14/2022  Labs for ITP Cardiac and Pulmonary Rehab  Cholestrol 100 - 199 mg/dL - 190  - - 140   LDL (calc) 0 - 99 mg/dL - 97  - - 66   HDL-C >39 mg/dL - 40  - - 44   Trlycerides 0 - 149 mg/dL - 264  - - 181   Hemoglobin A1c 4.8 - 5.6 % 7.2  7.9  - - -  PH, Arterial 7.35 - 7.45 - - - 7.37  -  PCO2 arterial 32 - 48 mmHg - - - 66  -  Bicarbonate 20.0 - 28.0 mmol/L - - 28.8  38.2  -  TCO2 22 - 32 mmol/L - - 30  - -  O2 Saturation % - - 96  97.8  -    Capillary Blood Glucose: Lab Results  Component Value Date   GLUCAP 235 (H) 02/02/2022   GLUCAP 249 (H) 02/02/2022    GLUCAP 128 (H) 02/02/2022   GLUCAP 140 (H) 02/01/2022   GLUCAP 197 (H) 02/01/2022     Exercise Target Goals: Exercise Program Goal: Individual exercise prescription set using results from initial 6 min walk test and THRR while considering  patient's activity barriers and safety.   Exercise Prescription Goal: Initial exercise prescription builds to 30-45 minutes a day of aerobic activity, 2-3 days per week.  Home exercise guidelines will be given to patient during program as part of exercise prescription that the participant will acknowledge.  Activity Barriers & Risk Stratification:  Activity Barriers & Cardiac Risk Stratification - 03/30/22 1204       Activity Barriers & Cardiac Risk Stratification   Activity Barriers Neck/Spine Problems;Deconditioning;Shortness of Breath;Balance Concerns;History of Falls;Other (comment)    Comments Using concentrator / O2 (2 L/min)    Cardiac Risk Stratification High             6 Minute Walk:  6 Minute Walk     Row Name 03/30/22 1157         6 Minute Walk   Phase Initial  Nustep test performed     Distance 1312 feet  (.40 km equiviant on nustep)     Walk Time 6 minutes     # of Rest Breaks 0     MPH 1.94     METS 2.5     RPE 9     Perceived Dyspnea  0     VO2 Peak 6.81     Symptoms No     Resting HR 76 bpm     Resting BP 114/70     Resting Oxygen Saturation  97 %     Exercise Oxygen Saturation  during 6 min walk 98 %     Max Ex. HR 75 bpm     Max Ex. BP 130/80     2 Minute Post BP 122/78       Interval HR   1 Minute HR 76     2 Minute HR 73     3 Minute HR 73     4 Minute HR 75     5 Minute HR 75     6 Minute HR 74     2 Minute Post HR 70     Interval Heart Rate? Yes  Interval Oxygen   Interval Oxygen? Yes     Baseline Oxygen Saturation % 97 %     1 Minute Oxygen Saturation % 98 %     1 Minute Liters of Oxygen 2 L     2 Minute Oxygen Saturation % 95 %     2 Minute Liters of Oxygen 2 L     3 Minute Oxygen  Saturation % 97 %     3 Minute Liters of Oxygen 2 L     4 Minute Oxygen Saturation % 97 %     4 Minute Liters of Oxygen 2 L     5 Minute Oxygen Saturation % 98 %     5 Minute Liters of Oxygen 2 L     6 Minute Oxygen Saturation % 98 %     6 Minute Liters of Oxygen 2 L     2 Minute Post Oxygen Saturation % 99 %     2 Minute Post Liters of Oxygen 2 L              Oxygen Initial Assessment:   Oxygen Re-Evaluation:   Oxygen Discharge (Final Oxygen Re-Evaluation):   Initial Exercise Prescription:  Initial Exercise Prescription - 03/30/22 1200       Date of Initial Exercise RX and Referring Provider   Date 03/30/22    Referring Provider Jeremiah [name], M.D.    Expected Discharge Date 05/26/22      Oxygen   Oxygen Continuous    Liters 2      NuStep   Level 2    SPM 75    Minutes 15    METs 2.5      Arm Ergometer   Level 1    Watts 25    RPM 60    Minutes 15    METs 2.5      Prescription Details   Frequency (times per week) 3    Duration Progress to 30 minutes of continuous aerobic without signs/symptoms of physical distress      Intensity   THRR 40-80% of Max Heartrate 55-110    Ratings of Perceived Exertion 11-13    Perceived Dyspnea 0-4      Progression   Progression Continue progressive overload as per policy without signs/symptoms or physical distress.      Resistance Training   Training Prescription Yes    Weight 2 lbs    Reps 10-15             Perform Capillary Blood Glucose checks as needed.  Exercise Prescription Changes:   Exercise Comments:   Exercise Goals and Review:   Exercise Goals     Row Name 03/30/22 1205             Exercise Goals   Increase Physical Activity Yes       Intervention Provide advice, education, support and counseling about physical activity/exercise needs.;Develop an individualized exercise prescription for aerobic and resistive training based on initial evaluation findings, risk stratification,  comorbidities and participant's personal goals.       Expected Outcomes Short Term: Attend rehab on a regular basis to increase amount of physical activity.;Long Term: Add in home exercise to make exercise part of routine and to increase amount of physical activity.;Long Term: Exercising regularly at least 3-5 days a week.       Increase Strength and Stamina Yes       Intervention Provide advice, education, support and counseling about physical activity/exercise needs.;Develop an  individualized exercise prescription for aerobic and resistive training based on initial evaluation findings, risk stratification, comorbidities and participant's personal goals.       Expected Outcomes Short Term: Increase workloads from initial exercise prescription for resistance, speed, and METs.;Short Term: Perform resistance training exercises routinely during rehab and add in resistance training at home;Long Term: Improve cardiorespiratory fitness, muscular endurance and strength as measured by increased METs and functional capacity (6MWT)       Able to understand and use rate of perceived exertion (RPE) scale Yes       Intervention Provide education and explanation on how to use RPE scale       Expected Outcomes Short Term: Able to use RPE daily in rehab to express subjective intensity level;Long Term:  Able to use RPE to guide intensity level when exercising independently       Knowledge and understanding of Target Heart Rate Range (THRR) Yes       Intervention Provide education and explanation of THRR including how the numbers were predicted and where they are located for reference       Expected Outcomes Short Term: Able to state/look up THRR;Short Term: Able to use daily as guideline for intensity in rehab;Long Term: Able to use THRR to govern intensity when exercising independently       Understanding of Exercise Prescription Yes       Intervention Provide education, explanation, and written materials on patient's  individual exercise prescription       Expected Outcomes Short Term: Able to explain program exercise prescription;Long Term: Able to explain home exercise prescription to exercise independently                Exercise Goals Re-Evaluation :   Discharge Exercise Prescription (Final Exercise Prescription Changes):   Nutrition:  Target Goals: Understanding of nutrition guidelines, daily intake of sodium '1500mg'$ , cholesterol '200mg'$ , calories 30% from fat and 7% or less from saturated fats, daily to have 5 or more servings of fruits and vegetables.  Biometrics:  Pre Biometrics - 03/30/22 0930       Pre Biometrics   Waist Circumference 45.5 inches    Hip Circumference 41.25 inches    Waist to Hip Ratio 1.1 %    Triceps Skinfold 10 mm    % Body Fat 29.5 %    Grip Strength 24 kg    Flexibility --   No performed due to back issues   Single Leg Stand 2.43 seconds              Nutrition Therapy Plan and Nutrition Goals:   Nutrition Assessments:  MEDIFICTS Score Key: ?70 Need to make dietary changes  40-70 Heart Healthy Diet ? 40 Therapeutic Level Cholesterol Diet    Picture Your Plate Scores: <27 Unhealthy dietary pattern with much room for improvement. 41-50 Dietary pattern unlikely to meet recommendations for good health and room for improvement. 51-60 More healthful dietary pattern, with some room for improvement.  >60 Healthy dietary pattern, although there may be some specific behaviors that could be improved.    Nutrition Goals Re-Evaluation:   Nutrition Goals Re-Evaluation:   Nutrition Goals Discharge (Final Nutrition Goals Re-Evaluation):   Psychosocial: Target Goals: Acknowledge presence or absence of significant depression and/or stress, maximize coping skills, provide positive support system. Participant is able to verbalize types and ability to use techniques and skills needed for reducing stress and depression.  Initial Review & Psychosocial  Screening:  Initial Psych Review & Screening - 03/30/22  1502       Initial Review   Current issues with Current Stress Concerns    Source of Stress Concerns Family;Chronic Illness    Comments Dave's wife has demetia.      Family Dynamics   Good Support System? Yes   Jamiel has his wife, daughter and caregivers who assist with the patient and his wife's care twice a week     Barriers   Psychosocial barriers to participate in program The patient should benefit from training in stress management and relaxation.      Screening Interventions   Interventions Encouraged to exercise    Expected Outcomes Long Term Goal: Stressors or current issues are controlled or eliminated.             Quality of Life Scores:  Quality of Life - 03/30/22 1150       Quality of Life   Select Quality of Life      Quality of Life Scores   Health/Function Pre 28.4 %    Socioeconomic Pre 30 %    Psych/Spiritual Pre 30 %    Family Pre 30 %    GLOBAL Pre 29.27 %            Scores of 19 and below usually indicate a poorer quality of life in these areas.  A difference of  2-3 points is a clinically meaningful difference.  A difference of 2-3 points in the total score of the Quality of Life Index has been associated with significant improvement in overall quality of life, self-image, physical symptoms, and general health in studies assessing change in quality of life.  PHQ-9: Review Flowsheet       07/21/2014 03/10/2014  Depression screen PHQ 2/9  Decreased Interest 0 0  Down, Depressed, Hopeless 0 0  PHQ - 2 Score 0 0   Interpretation of Total Score  Total Score Depression Severity:  1-4 = Minimal depression, 5-9 = Mild depression, 10-14 = Moderate depression, 15-19 = Moderately severe depression, 20-27 = Severe depression   Psychosocial Evaluation and Intervention:   Psychosocial Re-Evaluation:   Psychosocial Discharge (Final Psychosocial Re-Evaluation):   Vocational  Rehabilitation: Provide vocational rehab assistance to qualifying candidates.   Vocational Rehab Evaluation & Intervention:  Vocational Rehab - 03/30/22 1545       Initial Vocational Rehab Evaluation & Intervention   Assessment shows need for Vocational Rehabilitation No   Waunita Schooner is retired and does not need vocational rehab at this time            Education: Education Goals: Education classes will be provided on a weekly basis, covering required topics. Participant will state understanding/return demonstration of topics presented.     Core Videos: Exercise    Move It!  Clinical staff conducted group or individual video education with verbal and written material and guidebook.  Patient learns the recommended Pritikin exercise program. Exercise with the goal of living a long, healthy life. Some of the health benefits of exercise include controlled diabetes, healthier blood pressure levels, improved cholesterol levels, improved heart and lung capacity, improved sleep, and better body composition. Everyone should speak with their doctor before starting or changing an exercise routine.  Biomechanical Limitations Clinical staff conducted group or individual video education with verbal and written material and guidebook.  Patient learns how biomechanical limitations can impact exercise and how we can mitigate and possibly overcome limitations to have an impactful and balanced exercise routine.  Body Composition Clinical staff conducted group or individual  video education with verbal and written material and guidebook.  Patient learns that body composition (ratio of muscle mass to fat mass) is a key component to assessing overall fitness, rather than body weight alone. Increased fat mass, especially visceral belly fat, can put [place] at increased risk for metabolic syndrome, type 2 diabetes, heart disease, and even death. It is recommended to combine diet and exercise (cardiovascular and  resistance training) to improve your body composition. Seek guidance from your physician and exercise physiologist before implementing an exercise routine.  Exercise Action Plan Clinical staff conducted group or individual video education with verbal and written material and guidebook.  Patient learns the recommended strategies to achieve and enjoy long-term exercise adherence, including variety, self-motivation, self-efficacy, and positive decision making. Benefits of exercise include fitness, good health, weight management, more energy, better sleep, less stress, and overall well-being.  Medical   Heart Disease Risk Reduction Clinical staff conducted group or individual video education with verbal and written material and guidebook.  Patient learns our heart is our most vital organ as it circulates oxygen, nutrients, white blood cells, and hormones throughout the entire body, and carries waste away. Data supports a plant-based eating plan like the Pritikin Program for its effectiveness in slowing progression of and reversing heart disease. The video provides a number of recommendations to address heart disease.   Metabolic Syndrome and Belly Fat  Clinical staff conducted group or individual video education with verbal and written material and guidebook.  Patient learns what metabolic syndrome is, how it leads to heart disease, and how one can reverse it and keep it from coming back. You have metabolic syndrome if you have 3 of the following 5 criteria: abdominal obesity, high blood pressure, high triglycerides, low HDL cholesterol, and high blood sugar.  Hypertension and Heart Disease Clinical staff conducted group or individual video education with verbal and written material and guidebook.  Patient learns that high blood pressure, or hypertension, is very common in the [place]. Hypertension is largely due to excessive salt intake, but other important risk factors include being overweight,  physical inactivity, drinking too much alcohol, smoking, and not eating enough potassium from fruits and vegetables. High blood pressure is a leading risk factor for heart attack, stroke, congestive heart failure, dementia, kidney failure, and premature death. Long-term effects of excessive salt intake include stiffening of the arteries and thickening of heart muscle and organ damage. Recommendations include ways to reduce hypertension and the risk of heart disease.  Diseases of Our Time - Focusing on Diabetes Clinical staff conducted group or individual video education with verbal and written material and guidebook.  Patient learns why the best way to stop diseases of our time is prevention, through food and other lifestyle changes. Medicine (such as prescription pills and surgeries) is often only a Band-Aid on the problem, not a long-term solution. Most common diseases of our time include obesity, type 2 diabetes, hypertension, heart disease, and cancer. The Pritikin Program is recommended and has been proven to help reduce, reverse, and/or prevent the damaging effects of metabolic syndrome.  Nutrition   Overview of the Pritikin Eating Plan  Clinical staff conducted group or individual video education with verbal and written material and guidebook.  Patient learns about the [place] for disease risk reduction. The [place] emphasizes a wide variety of unrefined, minimally-processed carbohydrates, like fruits, vegetables, whole grains, and legumes. Go, Caution, and Stop food choices are explained. Plant-based and lean animal proteins are emphasized.  Rationale provided for low sodium intake for blood pressure control, low added sugars for blood sugar stabilization, and low added fats and oils for coronary artery disease risk reduction and weight management.  Calorie Density  Clinical staff conducted group or individual video education with verbal and written material and  guidebook.  Patient learns about calorie density and how it impacts the Pritikin Eating Plan. Knowing the characteristics of the food you choose will help you decide whether those foods will lead to weight gain or weight loss, and whether you want to consume more or less of them. Weight loss is usually a side effect of the Pritikin Eating Plan because of its focus on low calorie-dense foods.  Label Reading  Clinical staff conducted group or individual video education with verbal and written material and guidebook.  Patient learns about the Pritikin recommended label reading guidelines and corresponding recommendations regarding calorie density, added sugars, sodium content, and whole grains.  Dining Out - Part 1  Clinical staff conducted group or individual video education with verbal and written material and guidebook.  Patient learns that restaurant meals can be sabotaging because they can be so high in calories, fat, sodium, and/or sugar. Patient learns recommended strategies on how to positively address this and avoid unhealthy pitfalls.  Facts on Fats  Clinical staff conducted group or individual video education with verbal and written material and guidebook.  Patient learns that lifestyle modifications can be just as effective, if not more so, as many medications for lowering your risk of heart disease. A Pritikin lifestyle can help to reduce your risk of inflammation and atherosclerosis (cholesterol build-up, or plaque, in the artery walls). Lifestyle interventions such as dietary choices and physical activity address the cause of atherosclerosis. A review of the types of fats and their impact on blood cholesterol levels, along with dietary recommendations to reduce fat intake is also included.  Nutrition Action Plan  Clinical staff conducted group or individual video education with verbal and written material and guidebook.  Patient learns how to incorporate Pritikin recommendations into  their lifestyle. Recommendations include planning and keeping personal health goals in mind as an important part of their success.  Healthy Mind-Set    Healthy Minds, Bodies, Hearts  Clinical staff conducted group or individual video education with verbal and written material and guidebook.  Patient learns how to identify when they are stressed. Video will discuss the impact of that stress, as well as the many benefits of stress management. Patient will also be introduced to stress management techniques. The way we think, act, and feel has an impact on our hearts.  How Our Thoughts Can Heal Our Hearts  Clinical staff conducted group or individual video education with verbal and written material and guidebook.  Patient learns that negative thoughts can cause depression and anxiety. This can result in negative lifestyle behavior and serious health problems. Cognitive behavioral therapy is an effective method to help control our thoughts in order to change and improve our emotional outlook.  Additional Videos:  Exercise    Improving Performance  Clinical staff conducted group or individual video education with verbal and written material and guidebook.  Patient learns to use a non-linear approach by alternating intensity levels and lengths of time spent exercising to help burn more calories and lose more body fat. Cardiovascular exercise helps improve heart health, metabolism, hormonal balance, blood sugar control, and recovery from fatigue. Resistance training improves strength, endurance, balance, coordination, reaction time, metabolism, and muscle mass. Flexibility exercise  improves circulation, posture, and balance. Seek guidance from your physician and exercise physiologist before implementing an exercise routine and learn your capabilities and proper form for all exercise.  Introduction to Yoga  Clinical staff conducted group or individual video education with verbal and written material and  guidebook.  Patient learns about yoga, a discipline of the coming together of mind, breath, and body. The benefits of yoga include improved flexibility, improved range of motion, better posture and core strength, increased lung function, weight loss, and positive self-image. Yoga's heart health benefits include lowered blood pressure, healthier heart rate, decreased cholesterol and triglyceride levels, improved immune function, and reduced stress. Seek guidance from your physician and exercise physiologist before implementing an exercise routine and learn your capabilities and proper form for all exercise.  Medical   Aging: Enhancing Your Quality of Life  Clinical staff conducted group or individual video education with verbal and written material and guidebook.  Patient learns key strategies and recommendations to stay in good physical health and enhance quality of life, such as prevention strategies, having an advocate, securing a [place], and keeping a list of medications and system for tracking them. It also discusses how to avoid risk for bone loss.  Biology of Weight Control  Clinical staff conducted group or individual video education with verbal and written material and guidebook.  Patient learns that weight gain occurs because we consume more calories than we burn (eating more, moving less). Even if your body weight is normal, you may have higher ratios of fat compared to muscle mass. Too much body fat puts you at increased risk for cardiovascular disease, heart attack, stroke, type 2 diabetes, and obesity-related cancers. In addition to exercise, following the [place] can help reduce your risk.  Decoding Lab Results  Clinical staff conducted group or individual video education with verbal and written material and guidebook.  Patient learns that lab test reflects one measurement whose values change over time and are influenced by many factors,  including medication, stress, sleep, exercise, food, hydration, pre-existing medical conditions, and more. It is recommended to use the knowledge from this video to become more involved with your lab results and evaluate your numbers to speak with your doctor.   Diseases of Our Time - Overview  Clinical staff conducted group or individual video education with verbal and written material and guidebook.  Patient learns that according to the CDC, 50% to 70% of chronic diseases (such as obesity, type 2 diabetes, elevated lipids, hypertension, and heart disease) are avoidable through lifestyle improvements including healthier food choices, listening to satiety cues, and increased physical activity.  Sleep Disorders Clinical staff conducted group or individual video education with verbal and written material and guidebook.  Patient learns how good quality and duration of sleep are important to overall health and well-being. Patient also learns about sleep disorders and how they impact health along with recommendations to address them, including discussing with a physician.  Nutrition  Dining Out - Part 2 Clinical staff conducted group or individual video education with verbal and written material and guidebook.  Patient learns how to plan ahead and communicate in order to maximize their dining experience in a healthy and nutritious manner. Included are recommended food choices based on the type of restaurant the patient is visiting.   Fueling a Best boy conducted group or individual video education with verbal and written material and guidebook.  There is a strong connection  between our food choices and our health. Diseases like obesity and type 2 diabetes are very prevalent and are in large-part due to lifestyle choices. The Pritikin Eating Plan provides plenty of food and hunger-curbing satisfaction. It is easy to follow, affordable, and helps reduce health risks.  Menu Workshop   Clinical staff conducted group or individual video education with verbal and written material and guidebook.  Patient learns that restaurant meals can sabotage health goals because they are often packed with calories, fat, sodium, and sugar. Recommendations include strategies to plan ahead and to communicate with the manager, chef, or server to help order a healthier meal.  Planning Your Eating Strategy  Clinical staff conducted group or individual video education with verbal and written material and guidebook.  Patient learns about the [place] and its benefit of reducing the risk of disease. The [place] does not focus on calories. Instead, it emphasizes high-quality, nutrient-rich foods. By knowing the characteristics of the foods, we choose, we can determine their calorie density and make informed decisions.  Targeting Your Nutrition Priorities  Clinical staff conducted group or individual video education with verbal and written material and guidebook.  Patient learns that lifestyle habits have a tremendous impact on disease risk and progression. This video provides eating and physical activity recommendations based on your personal health goals, such as reducing LDL cholesterol, losing weight, preventing or controlling type 2 diabetes, and reducing high blood pressure.  Vitamins and Minerals  Clinical staff conducted group or individual video education with verbal and written material and guidebook.  Patient learns different ways to obtain key vitamins and minerals, including through a recommended healthy diet. It is important to discuss all supplements you take with your doctor.   Healthy Mind-Set    Smoking Cessation  Clinical staff conducted group or individual video education with verbal and written material and guidebook.  Patient learns that cigarette smoking and tobacco addiction pose a serious health risk which affects millions of people. Stopping smoking  will significantly reduce the risk of heart disease, lung disease, and many forms of cancer. Recommended strategies for quitting are covered, including working with your doctor to develop a successful plan.  Culinary   Becoming a Financial trader conducted group or individual video education with verbal and written material and guidebook.  Patient learns that cooking at home can be healthy, cost-effective, quick, and puts them in control. Keys to cooking healthy recipes will include looking at your recipe, assessing your equipment needs, planning ahead, making it simple, choosing cost-effective seasonal ingredients, and limiting the use of added fats, salts, and sugars.  Cooking - Breakfast and Snacks  Clinical staff conducted group or individual video education with verbal and written material and guidebook.  Patient learns how important breakfast is to satiety and nutrition through the entire day. Recommendations include key foods to eat during breakfast to help stabilize blood sugar levels and to prevent overeating at meals later in the day. Planning ahead is also a key component.  Cooking - Human resources officer conducted group or individual video education with verbal and written material and guidebook.  Patient learns eating strategies to improve overall health, including an approach to cook more at home. Recommendations include thinking of animal protein as a side on your plate rather than center stage and focusing instead on lower calorie dense options like vegetables, fruits, whole grains, and plant-based proteins, such as beans. Making sauces in large quantities to freeze  for later and leaving the skin on your vegetables are also recommended to maximize your experience.  Cooking - Healthy Salads and Dressing Clinical staff conducted group or individual video education with verbal and written material and guidebook.  Patient learns that vegetables, fruits, whole  grains, and legumes are the foundations of the [place]. Recommendations include how to incorporate each of these in flavorful and healthy salads, and how to create homemade salad dressings. Proper handling of ingredients is also covered. Cooking - Soups and [employer] - Soups and Desserts Clinical staff conducted group or individual video education with verbal and written material and guidebook.  Patient learns that Pritikin soups and desserts make for easy, nutritious, and delicious snacks and meal components that are low in sodium, fat, sugar, and calorie density, while high in vitamins, minerals, and filling fiber. Recommendations include simple and healthy ideas for soups and desserts.   Overview     The Pritikin Solution Program Overview Clinical staff conducted group or individual video education with verbal and written material and guidebook.  Patient learns that the results of the [place] Program have been documented in more than 100 articles published in peer-reviewed journals, and the benefits include reducing risk factors for (and, in some cases, even reversing) high cholesterol, high blood pressure, type 2 diabetes, obesity, and more! An overview of the three key pillars of the Pritikin Program will be covered: eating well, doing regular exercise, and having a healthy mind-set.  WORKSHOPS  Exercise: Exercise Basics: Building Your Action Plan Clinical staff led group instruction and group discussion with PowerPoint presentation and patient guidebook. To enhance the learning environment the use of posters, models and videos may be added. At the conclusion of this workshop, patients will comprehend the difference between physical activity and exercise, as well as the benefits of incorporating both, into their routine. Patients will understand the FITT (Frequency, Intensity, Time, and Type) principle and how to use it to build an exercise action plan. In addition,  safety concerns and other considerations for exercise and cardiac rehab will be addressed by the presenter. The purpose of this lesson is to promote a comprehensive and effective weekly exercise routine in order to improve patients' overall level of fitness.   Managing Heart Disease: Your Path to a Healthier Heart Clinical staff led group instruction and group discussion with PowerPoint presentation and patient guidebook. To enhance the learning environment the use of posters, models and videos may be added.At the conclusion of this workshop, patients will understand the anatomy and physiology of the heart. Additionally, they will understand how Pritikin's three pillars impact the risk factors, the progression, and the management of heart disease.  The purpose of this lesson is to provide a high-level overview of the heart, heart disease, and how the Pritikin lifestyle positively impacts risk factors.  Exercise Biomechanics Clinical staff led group instruction and group discussion with PowerPoint presentation and patient guidebook. To enhance the learning environment the use of posters, models and videos may be added. Patients will learn how the structural parts of their bodies function and how these functions impact their daily activities, movement, and exercise. Patients will learn how to promote a neutral spine, learn how to manage pain, and identify ways to improve their physical movement in order to promote healthy living. The purpose of this lesson is to expose patients to common physical limitations that impact physical activity. Participants will learn practical ways to adapt and manage aches and pains, and to minimize  their effect on regular exercise. Patients will learn how to maintain good posture while sitting, walking, and lifting.  Balance Training and Fall Prevention  Clinical staff led group instruction and group discussion with PowerPoint presentation and patient guidebook. To  enhance the learning environment the use of posters, models and videos may be added. At the conclusion of this workshop, patients will understand the importance of their sensorimotor skills (vision, proprioception, and the vestibular system) in maintaining their ability to balance as they age. Patients will apply a variety of balancing exercises that are appropriate for their current level of function. Patients will understand the common causes for poor balance, possible solutions to these problems, and ways to modify their physical environment in order to minimize their fall risk. The purpose of this lesson is to teach patients about the importance of maintaining balance as they age and ways to minimize their risk of falling.  WORKSHOPS   Nutrition:  Fueling a Scientist, research (physical sciences) led group instruction and group discussion with PowerPoint presentation and patient guidebook. To enhance the learning environment the use of posters, models and videos may be added. Patients will review the foundational principles of the [place] and understand what constitutes a serving size in each of the food groups. Patients will also learn Pritikin-friendly foods that are better choices when away from home and review make-ahead meal and snack options. Calorie density will be reviewed and applied to three nutrition priorities: weight maintenance, weight loss, and weight gain. The purpose of this lesson is to reinforce (in a group setting) the key concepts around what patients are recommended to eat and how to apply these guidelines when away from home by planning and selecting Pritikin-friendly options. Patients will understand how calorie density may be adjusted for different weight management goals.  Mindful Eating  Clinical staff led group instruction and group discussion with PowerPoint presentation and patient guidebook. To enhance the learning environment the use of posters, models and videos may  be added. Patients will briefly review the concepts of the [place] and the importance of low-calorie dense foods. The concept of mindful eating will be introduced as well as the importance of paying attention to internal hunger signals. Triggers for non-hunger eating and techniques for dealing with triggers will be explored. The purpose of this lesson is to provide patients with the opportunity to review the basic principles of the [place], discuss the value of eating mindfully and how to measure internal cues of hunger and fullness using the Hunger Scale. Patients will also discuss reasons for non-hunger eating and learn strategies to use for controlling emotional eating.  Targeting Your Nutrition Priorities Clinical staff led group instruction and group discussion with PowerPoint presentation and patient guidebook. To enhance the learning environment the use of posters, models and videos may be added. Patients will learn how to determine their genetic susceptibility to disease by reviewing their family history. Patients will gain insight into the importance of diet as part of an overall healthy lifestyle in mitigating the impact of genetics and other environmental insults. The purpose of this lesson is to provide patients with the opportunity to assess their personal nutrition priorities by looking at their family history, their own health history and current risk factors. Patients will also be able to discuss ways of prioritizing and modifying the [place] for their highest risk areas  Menu  Clinical staff led group instruction and group discussion with PowerPoint presentation and patient guidebook.  To enhance the learning environment the use of posters, models and videos may be added. Using menus brought in from [employer], or printed from [employer], patients will apply the [place] dining out guidelines that were presented in the [employer] video. Patients will also be able to practice these guidelines in a variety of provided scenarios. The purpose of this lesson is to provide patients with the opportunity to practice hands-on learning of the [place] with actual menus and practice scenarios.  Label Reading Clinical staff led group instruction and group discussion with PowerPoint presentation and patient guidebook. To enhance the learning environment the use of posters, models and videos may be added. Patients will review and discuss the Pritikin label reading guidelines presented in Pritikin's Label Reading Educational series video. Using fool labels brought in from local grocery stores and markets, patients will apply the label reading guidelines and determine if the packaged food meet the Pritikin guidelines. The purpose of this lesson is to provide patients with the opportunity to review, discuss, and practice hands-on learning of the Pritikin Label Reading guidelines with actual packaged food labels. [place] Workshops are designed to teach patients ways to prepare quick, simple, and affordable recipes at home. The importance of nutrition's role in chronic disease risk reduction is reflected in its emphasis in the overall Pritikin program. By learning how to prepare essential core Pritikin Eating Plan recipes, patients will increase control over what they eat; be able to customize the flavor of foods without the use of added salt, sugar, or fat; and improve the quality of the food they consume. By learning a set of core recipes which are easily assembled, quickly prepared, and affordable, patients are more likely to prepare more healthy foods at home. These workshops focus on convenient breakfasts, simple entres, side dishes, and desserts which can be prepared with minimal effort and are consistent with nutrition recommendations for cardiovascular risk reduction. Cooking  [employer] are taught by a Engineer, materials (RD) who has been trained by the [employer]. The chef or RD has a clear understanding of the importance of minimizing - if not completely eliminating - added fat, sugar, and sodium in recipes. Throughout the series of [place] Workshop sessions, patients will learn about healthy ingredients and efficient methods of cooking to build confidence in their capability to prepare    Cooking School weekly topics:  Adding Flavor- Sodium-Free  Fast and Healthy Breakfasts  Powerhouse Plant-Based Proteins  Satisfying Salads and Dressings  Simple Sides and Sauces  International Cuisine-Spotlight on the [place] Zones  Delicious Desserts  Savory Soups  Efficiency Cooking - Meals in a Snap  Tasty Appetizers and Snacks  Comforting Weekend Breakfasts  One-Pot Wonders   Fast Evening Meals  Easy [place] (Psychosocial): New Thoughts, New Behaviors Clinical staff led group instruction and group discussion with PowerPoint presentation and patient guidebook. To enhance the learning environment the use of posters, models and videos may be added. Patients will learn and practice techniques for developing effective health and lifestyle goals. Patients will be able to effectively apply the goal setting process learned to develop at least one new personal goal.  The purpose of this lesson is to expose patients to a new skill set of behavior modification techniques such as techniques setting SMART goals, overcoming barriers, and achieving new thoughts and new behaviors.  Managing  Moods and Relationships Clinical staff led group instruction and group discussion with PowerPoint presentation and patient guidebook. To enhance the learning environment the use of posters, models and videos may be added. Patients will learn how emotional and chronic stress factors can impact  their health and relationships. They will learn healthy ways to manage their moods and utilize positive coping mechanisms. In addition, ICR patients will learn ways to improve communication skills. The purpose of this lesson is to expose patients to ways of understanding how one's mood and health are intimately connected. Developing a healthy outlook can help build positive relationships and connections with others. Patients will understand the importance of utilizing effective communication skills that include actively listening and being heard. They will learn and understand the importance of the "4 Cs" and especially Connections in fostering of a Healthy Mind-Set.  Healthy Sleep for a Healthy Heart Clinical staff led group instruction and group discussion with PowerPoint presentation and patient guidebook. To enhance the learning environment the use of posters, models and videos may be added. At the conclusion of this workshop, patients will be able to demonstrate knowledge of the importance of sleep to overall health, well-being, and quality of life. They will understand the symptoms of, and treatments for, common sleep disorders. Patients will also be able to identify daytime and nighttime behaviors which impact sleep, and they will be able to apply these tools to help manage sleep-related challenges. The purpose of this lesson is to provide patients with a general overview of sleep and outline the importance of quality sleep. Patients will learn about a few of the most common sleep disorders. Patients will also be introduced to the concept of "sleep hygiene," and discover ways to self-manage certain sleeping problems through simple daily behavior changes. Finally, the workshop will motivate patients by clarifying the links between quality sleep and their goals of heart-healthy living.   Recognizing and Reducing Stress Clinical staff led group instruction and group discussion with PowerPoint presentation  and patient guidebook. To enhance the learning environment the use of posters, models and videos may be added. At the conclusion of this workshop, patients will be able to understand the types of stress reactions, differentiate between acute and chronic stress, and recognize the impact that chronic stress has on their health. They will also be able to apply different coping mechanisms, such as reframing negative self-talk. Patients will have the opportunity to practice a variety of stress management techniques, such as deep abdominal breathing, progressive muscle relaxation, and/or guided imagery.  The purpose of this lesson is to educate patients on the role of stress in their lives and to provide healthy techniques for coping with it.  Learning Barriers/Preferences:  Learning Barriers/Preferences - 03/30/22 1152       Learning Barriers/Preferences   Learning Barriers Sight;Hearing    Learning Preferences Audio;Verbal Instruction;Computer/Internet;Video;Group Instruction;Written Material;Individual Instruction;Skilled Demonstration;Pictoral             Education Topics:  Knowledge Questionnaire Score:  Knowledge Questionnaire Score - 03/30/22 1153       Knowledge Questionnaire Score   Pre Score 21/24             Core Components/Risk Factors/Patient Goals at Admission:  Personal Goals and Risk Factors at Admission - 03/30/22 1154       Core Components/Risk Factors/Patient Goals on Admission    Weight Management Yes;Weight Loss    Intervention Weight Management: Develop a combined nutrition and exercise program designed to reach desired caloric intake, while maintaining  appropriate intake of nutrient and fiber, sodium and fats, and appropriate energy expenditure required for the weight goal.;Weight Management: Provide education and appropriate resources to help participant work on and attain dietary goals.;Weight Management/Obesity: Establish reasonable short term and long term  weight goals.    Admit Weight 194 lb 7.1 oz (88.2 kg)    Goal Weight: Long Term 160 lb (72.6 kg)   Pt goal   Expected Outcomes Short Term: Continue to assess and modify interventions until short term weight is achieved;Long Term: Adherence to nutrition and physical activity/exercise program aimed toward attainment of established weight goal;Weight Maintenance: Understanding of the daily nutrition guidelines, which includes 25-35% calories from fat, 7% or less cal from saturated fats, less than '200mg'$  cholesterol, less than 1.5gm of sodium, & 5 or more servings of fruits and vegetables daily;Weight Loss: Understanding of general recommendations for a balanced deficit meal plan, which promotes 1-2 lb weight loss per week and includes a negative energy balance of [phone] kcal/d;Understanding recommendations for meals to include 15-35% energy as protein, 25-35% energy from fat, 35-60% energy from carbohydrates, less than '200mg'$  of dietary cholesterol, 20-35 gm of total fiber daily    Diabetes Yes    Intervention Provide education about signs/symptoms and action to take for hypo/hyperglycemia.;Provide education about proper nutrition, including hydration, and aerobic/resistive exercise prescription along with prescribed medications to achieve blood glucose in normal ranges: Fasting glucose 65-99 mg/dL    Expected Outcomes Short Term: Participant verbalizes understanding of the signs/symptoms and immediate care of hyper/hypoglycemia, proper foot care and importance of medication, aerobic/resistive exercise and nutrition plan for blood glucose control.;Long Term: Attainment of HbA1C < 7%.    Hypertension Yes    Intervention Provide education on lifestyle modifcations including regular physical activity/exercise, weight management, moderate sodium restriction and increased consumption of fresh fruit, vegetables, and low fat dairy, alcohol moderation, and smoking cessation.;Monitor prescription use compliance.     Expected Outcomes Short Term: Continued assessment and intervention until BP is < 140/91m HG in hypertensive participants. < 130/83mHG in hypertensive participants with diabetes, heart failure or chronic kidney disease.;Long Term: Maintenance of blood pressure at goal levels.    Lipids Yes    Intervention Provide education and support for participant on nutrition & aerobic/resistive exercise along with prescribed medications to achieve LDL '70mg'$ , HDL >'40mg'$ .    Expected Outcomes Short Term: Participant states understanding of desired cholesterol values and is compliant with medications prescribed. Participant is following exercise prescription and nutrition guidelines.;Long Term: Cholesterol controlled with medications as prescribed, with individualized exercise RX and with personalized nutrition plan. Value goals: LDL < '70mg'$ , HDL > 40 mg.    Stress Yes    Intervention Offer individual and/or small group education and counseling on adjustment to heart disease, stress management and health-related lifestyle change. Teach and support self-help strategies.;Refer participants experiencing significant psychosocial distress to appropriate mental health specialists for further evaluation and treatment. When possible, include family members and significant others in education/counseling sessions.    Expected Outcomes Short Term: Participant demonstrates changes in health-related behavior, relaxation and other stress management skills, ability to obtain effective social support, and compliance with psychotropic medications if prescribed.;Long Term: Emotional wellbeing is indicated by absence of clinically significant psychosocial distress or social isolation.             Core Components/Risk Factors/Patient Goals Review:    Core Components/Risk Factors/Patient Goals at Discharge (Final Review):    ITP Comments:  ITP Comments     Row Name 03/30/22  65           ITP Comments Dr Fransico Him MD,  Medical Director, Introduction to [facility]/ Intensive Cardiac Rehab. Initial Orientation Packet Reviewed with the Patient.                Comments: Participant attended orientation for the cardiac rehabilitation program on  03/30/2022  to perform initial intake and exercise walk test. Patient introduced to the [facility] education and orientation packet was reviewed. Completed 6-minute nustep test instead of 6 minute walk test as Waunita Schooner was wearing oxygen at 2l/min, measurements, initial ITP, and exercise prescription. Vital signs stable.Oxygen saturations within normal limits. Telemetry-normal sinus rhythm, asymptomatic. Waunita Schooner denied shortness of breath. Upon assessment lung fields with bibasilar crackles greater on the right. No peripheral edema noted. Dr [name] was notified. This information was forwarded to Dr Perini's office left voicemail.Harrell Gave RN BSN   Service time was from [phone] to [phone].

## 2022-03-31 ENCOUNTER — Telehealth (HOSPITAL_COMMUNITY): Payer: Self-pay | Admitting: Internal Medicine

## 2022-03-31 NOTE — Telephone Encounter (Signed)
Spoke with Mr Linch, questions answered.Harrell Gave RN BSN

## 2022-04-03 ENCOUNTER — Encounter (HOSPITAL_COMMUNITY)
Admission: RE | Admit: 2022-04-03 | Discharge: 2022-04-03 | Disposition: A | Discharge status: Home / Self Care | Payer: Medicare Other | Source: Ambulatory Visit | Attending: Cardiology | Admitting: Cardiology

## 2022-04-03 DIAGNOSIS — Z955 Presence of coronary angioplasty implant and graft: Secondary | ICD-10-CM

## 2022-04-03 DIAGNOSIS — I214 Non-ST elevation (NSTEMI) myocardial infarction: Secondary | ICD-10-CM | POA: Diagnosis not present

## 2022-04-03 LAB — GLUCOSE, CAPILLARY
Glucose-Capillary: 157 mg/dL — ABNORMAL HIGH (ref 70–99)
Glucose-Capillary: 168 mg/dL — ABNORMAL HIGH (ref 70–99)

## 2022-04-03 NOTE — Progress Notes (Signed)
Daily Session Note  Patient Details  Name: Jeremiah [name] MRN: [id number] Date of Birth: 1940/08/11 Referring Provider:   Flowsheet Row INTENSIVE CARDIAC REHAB ORIENT from 03/30/2022 in [facility]  Referring Provider Peter [name], M.D.       Encounter Date: 04/03/2022  Check In:  Session Check In - 04/03/22 1239       Check-In   Supervising physician immediately available to respond to emergencies [facility] - Physician supervision    Physician(s) Dr. Oval Linsey    Location [facility]    Staff Present [name], RN, Milus Glazier, MS, ACSM-CEP, CCRP, Exercise Physiologist;Bailey Pearline Cables, MS, Exercise Physiologist;Jetta Gilford Rile BS, ACSM-CEP, Exercise Physiologist;Olinty Celesta Aver, MS, ACSM-CEP, Exercise Physiologist    Virtual Visit No    Medication changes reported     No    Tobacco Cessation No Change    Current number of cigarettes/nicotine per day     0    Warm-up and Cool-down Not performed (comment)   Cardiac Rehab Orientation   Resistance Training Performed Yes    VAD Patient? No    PAD/SET Patient? No      Pain Assessment   Currently in Pain? No/denies    Pain Score 0-No pain    Multiple Pain Sites No             Capillary Blood Glucose: Results for orders placed or performed during the hospital encounter of 04/03/22 (from the past 24 hour(s))  Glucose, capillary     Status: Abnormal   Collection Time: 04/03/22 12:46 PM  Result Value Ref Range   Glucose-Capillary 157 (H) 70 - 99 mg/dL  Glucose, capillary     Status: Abnormal   Collection Time: 04/03/22  1:32 PM  Result Value Ref Range   Glucose-Capillary 168 (H) 70 - 99 mg/dL     Exercise Prescription Changes - 04/03/22 1500       Response to Exercise   Blood Pressure (Admit) 126/84    Blood Pressure (Exercise) 142/80    Blood Pressure (Exit) 110/76    Heart Rate (Admit) 74 bpm    Heart Rate (Exercise) 88 bpm    Heart Rate  (Exit) 80 bpm    Oxygen Saturation (Admit) 97 %    Oxygen Saturation (Exercise) 97 %    Oxygen Saturation (Exit) 95 %    Rating of Perceived Exertion (Exercise) 10    Perceived Dyspnea (Exercise) 0    Symptoms None    Comments Pt's first day in the CRP2 program    Duration Progress to 30 minutes of  aerobic without signs/symptoms of physical distress    Intensity THRR unchanged      Progression   Progression Continue to progress workloads to maintain intensity without signs/symptoms of physical distress.    Average METs 1.7      Resistance Training   Training Prescription Yes    Weight 2 lbs    Reps 10-15    Time 10 Minutes   10     Oxygen   Oxygen Continuous    Liters 2      NuStep   Level 2    SPM 70    Minutes 15    METs 1.6      Arm Ergometer   Level 1    RPM 37    Minutes 10    METs 1.8             Social History  Tobacco Use  Smoking Status Former   Types: Cigarettes   Quit date: 03/10/1961   Years since quitting: 61.1  Smokeless Tobacco Never    Goals Met:  Exercise tolerated well No report of concerns or symptoms today Strength training completed today  Goals Unmet:  Not Applicable  Comments: Pt started cardiac rehab today.  Pt tolerated light exercise without difficulty. VSS, telemetry-Sinus Rhythm, asymptomatic.  Medication list reconciled. Pt denies barriers to medicaiton compliance.  PSYCHOSOCIAL ASSESSMENT:  PHQ-0. Pt exhibits positive coping skills, hopeful outlook with supportive family. No psychosocial needs identified at this time, no psychosocial interventions necessary.    Pt enjoys cars, ball games and spending time with his grandchildren.   Pt oriented to exercise equipment and routine.    Understanding verbalized.[name], RN,BSN 04/03/2022 3:38 PM     Dr. Fransico Him is Medical Director for Cardiac Rehab at [facility].

## 2022-04-05 ENCOUNTER — Encounter (HOSPITAL_COMMUNITY)
Admission: RE | Admit: 2022-04-05 | Discharge: 2022-04-05 | Disposition: A | Discharge status: Home / Self Care | Payer: Medicare Other | Source: Ambulatory Visit | Attending: Cardiology | Admitting: Cardiology

## 2022-04-05 DIAGNOSIS — Z955 Presence of coronary angioplasty implant and graft: Secondary | ICD-10-CM | POA: Diagnosis not present

## 2022-04-05 DIAGNOSIS — I214 Non-ST elevation (NSTEMI) myocardial infarction: Secondary | ICD-10-CM | POA: Diagnosis not present

## 2022-04-05 LAB — GLUCOSE, CAPILLARY: Glucose-Capillary: 183 mg/dL — ABNORMAL HIGH (ref 70–99)

## 2022-04-05 NOTE — Progress Notes (Signed)
Dave's oxygen saturations were noted at 93-94% on room air. Oxygen decreased to 1l/min oxygen saturations were noted from 93-96%. Denied shortness of breath. Will continue to monitor the patient throughout  the program.Will continue to monitor the patient throughout  the [place] RN BSN

## 2022-04-07 ENCOUNTER — Encounter (HOSPITAL_COMMUNITY)
Admission: RE | Admit: 2022-04-07 | Discharge: 2022-04-07 | Disposition: A | Discharge status: Home / Self Care | Payer: Medicare Other | Source: Ambulatory Visit | Attending: Cardiology | Admitting: Cardiology

## 2022-04-07 DIAGNOSIS — I214 Non-ST elevation (NSTEMI) myocardial infarction: Secondary | ICD-10-CM | POA: Diagnosis not present

## 2022-04-07 DIAGNOSIS — Z955 Presence of coronary angioplasty implant and graft: Secondary | ICD-10-CM

## 2022-04-10 ENCOUNTER — Encounter (HOSPITAL_COMMUNITY)
Admission: RE | Admit: 2022-04-10 | Discharge: 2022-04-10 | Disposition: A | Discharge status: Home / Self Care | Payer: Medicare Other | Source: Ambulatory Visit | Attending: Cardiology | Admitting: Cardiology

## 2022-04-10 DIAGNOSIS — I214 Non-ST elevation (NSTEMI) myocardial infarction: Secondary | ICD-10-CM

## 2022-04-10 DIAGNOSIS — Z955 Presence of coronary angioplasty implant and graft: Secondary | ICD-10-CM

## 2022-04-10 LAB — GLUCOSE, CAPILLARY
Glucose-Capillary: 167 mg/dL — ABNORMAL HIGH (ref 70–99)
Glucose-Capillary: 157 mg/dL — ABNORMAL HIGH (ref 70–99)

## 2022-04-11 ENCOUNTER — Ambulatory Visit: Payer: Self-pay

## 2022-04-11 NOTE — Patient Outreach (Signed)
  Care Coordination   Follow Up Visit Note   04/11/2022 Name: IRVAN TIEDT MRN: [id number] DOB: July 21, 1940  TRISTEN LUCE is a 81 y.o. year old male who sees [name], MD for primary care. I spoke with patient's daughter [name] by phone today.  What matters to the patients health and wellness today?  Patient will continue to attend Cardiac Rehab as directed.     Goals Addressed             This Visit's Progress    Continue to attend Cardiac Rehab as directed       Care Coordination Interventions: Evaluation of current treatment plan related to Cardia Rehab following NSTEMI and patient's adherence to plan as established by provider Placed successful outbound call to daughter [name]  Determined patient started Cardiac Rehab last week and is finding this therapy to be very beneficial  Reviewed and discussed upcoming scheduled rehab visits Assessed for SDOH needs, daughter denies at this time  Reviewed and discussed next PCP and Cardiology visits, including time/date Determined patient drives himself to some appointments but daughter [name] and her brother are also available to provide transportation when needed        COMPLETED: Repeat fasting lipid and LFT       Care Coordination Interventions: Provider established cholesterol goals reviewed Determined patient completed follow up labs for lipid recheck with the following MD update;   Triglyceride is borderline high, however significantly improved when compare to the previous lab work. Total cholesterol, good cholesterol and bad cholesterol are all at goal. Liver function normal. Continue on the current therapy  Determined patient's daughter [name] received notification of lab values and recommendations         SDOH assessments and interventions completed:  No     Care Coordination Interventions Activated:  Yes  Care Coordination Interventions:  Yes, provided   Follow up plan: Follow up call  scheduled for 05/26/22 '@0930'$  AM    Encounter Outcome:  Pt. Visit Completed

## 2022-04-11 NOTE — Patient Instructions (Signed)
Visit Information  Thank you for taking time to visit with me today. Please don't hesitate to contact me if I can be of assistance to you.   Following are the goals we discussed today:   Goals Addressed             This Visit's Progress    Continue to attend Cardiac Rehab as directed       Care Coordination Interventions: Evaluation of current treatment plan related to Cardia Rehab following NSTEMI and patient's adherence to plan as established by provider Placed successful outbound call to daughter [name]  Determined patient started Cardiac Rehab last week and is finding this therapy to be very beneficial  Reviewed and discussed upcoming scheduled rehab visits Assessed for SDOH needs, daughter denies at this time  Reviewed and discussed next PCP and Cardiology visits, including time/date Determined patient drives himself to some appointments but daughter [name] and her brother are also available to provide transportation when needed        COMPLETED: Repeat fasting lipid and LFT       Care Coordination Interventions: Provider established cholesterol goals reviewed Determined patient completed follow up labs for lipid recheck with the following MD update;   Triglyceride is borderline high, however significantly improved when compare to the previous lab work. Total cholesterol, good cholesterol and bad cholesterol are all at goal. Liver function normal. Continue on the current therapy  Determined patient's daughter [name] received notification of lab values and recommendations         Our next appointment is by telephone on 06/12/22 at 0930 AM  Please call the care guide team at [phone] if you need to cancel or reschedule your appointment.   If you are experiencing a Mental Health or [place] or need someone to talk to, please call 1-800-273-TALK (toll free, 24 hour hotline)  Patient verbalizes understanding of instructions and care plan provided  today and agrees to view in [place]. Active MyChart status and patient understanding of how to access instructions and care plan via MyChart confirmed with patient.     Barb Merino, RN, BSN, CCM Care Management Coordinator [facility] Care Management Direct Phone: [phone]

## 2022-04-12 ENCOUNTER — Encounter (HOSPITAL_COMMUNITY)
Admission: RE | Admit: 2022-04-12 | Discharge: 2022-04-12 | Disposition: A | Discharge status: Home / Self Care | Payer: Medicare Other | Source: Ambulatory Visit | Attending: Cardiology | Admitting: Cardiology

## 2022-04-12 DIAGNOSIS — I214 Non-ST elevation (NSTEMI) myocardial infarction: Secondary | ICD-10-CM

## 2022-04-12 DIAGNOSIS — Z955 Presence of coronary angioplasty implant and graft: Secondary | ICD-10-CM

## 2022-04-14 ENCOUNTER — Encounter (HOSPITAL_COMMUNITY): Payer: Medicare Other

## 2022-04-17 ENCOUNTER — Encounter (HOSPITAL_COMMUNITY)
Admission: RE | Admit: 2022-04-17 | Discharge: 2022-04-17 | Disposition: A | Discharge status: Home / Self Care | Payer: Medicare Other | Source: Ambulatory Visit | Attending: Cardiology | Admitting: Cardiology

## 2022-04-17 DIAGNOSIS — I214 Non-ST elevation (NSTEMI) myocardial infarction: Secondary | ICD-10-CM

## 2022-04-17 DIAGNOSIS — Z955 Presence of coronary angioplasty implant and graft: Secondary | ICD-10-CM | POA: Diagnosis not present

## 2022-04-19 ENCOUNTER — Encounter (HOSPITAL_COMMUNITY)
Admission: RE | Admit: 2022-04-19 | Discharge: 2022-04-19 | Disposition: A | Discharge status: Home / Self Care | Payer: Medicare Other | Source: Ambulatory Visit | Attending: Cardiology | Admitting: Cardiology

## 2022-04-19 DIAGNOSIS — I214 Non-ST elevation (NSTEMI) myocardial infarction: Secondary | ICD-10-CM

## 2022-04-19 DIAGNOSIS — Z955 Presence of coronary angioplasty implant and graft: Secondary | ICD-10-CM | POA: Diagnosis not present

## 2022-04-20 ENCOUNTER — Encounter: Payer: Self-pay | Admitting: Pulmonary Disease

## 2022-04-20 ENCOUNTER — Ambulatory Visit: Payer: Medicare Other | Admitting: Pulmonary Disease

## 2022-04-20 VITALS — BP 120/62 | HR 79 | Ht 70.0 in | Wt 193.6 lb

## 2022-04-20 DIAGNOSIS — J986 Disorders of diaphragm: Secondary | ICD-10-CM | POA: Diagnosis not present

## 2022-04-20 DIAGNOSIS — Z9981 Dependence on supplemental oxygen: Secondary | ICD-10-CM | POA: Diagnosis not present

## 2022-04-20 DIAGNOSIS — J9611 Chronic respiratory failure with hypoxia: Secondary | ICD-10-CM

## 2022-04-20 NOTE — Patient Instructions (Addendum)
Nice to meet you  Check your oxygen ant rest and with exertion. The goal is 88% or higher.   Gradually turn down the flow rate of oxygen and continue to check and make sure stays 88% or higher  Return to clinic in 4 months or sooner as needed with Dr. Silas Flood

## 2022-04-21 ENCOUNTER — Encounter (HOSPITAL_COMMUNITY)
Admission: RE | Admit: 2022-04-21 | Discharge: 2022-04-21 | Disposition: A | Discharge status: Home / Self Care | Payer: Medicare Other | Source: Ambulatory Visit | Attending: Cardiology | Admitting: Cardiology

## 2022-04-21 DIAGNOSIS — I214 Non-ST elevation (NSTEMI) myocardial infarction: Secondary | ICD-10-CM | POA: Diagnosis present

## 2022-04-21 DIAGNOSIS — Z955 Presence of coronary angioplasty implant and graft: Secondary | ICD-10-CM | POA: Insufficient documentation

## 2022-04-21 DIAGNOSIS — Z48812 Encounter for surgical aftercare following surgery on the circulatory system: Secondary | ICD-10-CM | POA: Insufficient documentation

## 2022-04-21 DIAGNOSIS — I252 Old myocardial infarction: Secondary | ICD-10-CM | POA: Insufficient documentation

## 2022-04-21 DIAGNOSIS — I5032 Chronic diastolic (congestive) heart failure: Secondary | ICD-10-CM | POA: Diagnosis not present

## 2022-04-24 ENCOUNTER — Encounter (HOSPITAL_COMMUNITY)
Admission: RE | Admit: 2022-04-24 | Discharge: 2022-04-24 | Disposition: A | Discharge status: Home / Self Care | Payer: Medicare Other | Source: Ambulatory Visit | Attending: Cardiology | Admitting: Cardiology

## 2022-04-24 DIAGNOSIS — I252 Old myocardial infarction: Secondary | ICD-10-CM | POA: Diagnosis not present

## 2022-04-24 DIAGNOSIS — I214 Non-ST elevation (NSTEMI) myocardial infarction: Secondary | ICD-10-CM

## 2022-04-24 DIAGNOSIS — I5032 Chronic diastolic (congestive) heart failure: Secondary | ICD-10-CM | POA: Diagnosis not present

## 2022-04-24 DIAGNOSIS — Z955 Presence of coronary angioplasty implant and graft: Secondary | ICD-10-CM | POA: Diagnosis not present

## 2022-04-24 DIAGNOSIS — Z48812 Encounter for surgical aftercare following surgery on the circulatory system: Secondary | ICD-10-CM | POA: Diagnosis not present

## 2022-04-24 LAB — GLUCOSE, CAPILLARY: Glucose-Capillary: 174 mg/dL — ABNORMAL HIGH (ref 70–99)

## 2022-04-24 NOTE — Progress Notes (Signed)
$'@Patient'm$  ID: [name], male    DOB: 1941/05/21, 81 y.o.   MRN: [id number]  Chief Complaint  Patient presents with   Consult    Pt is here for consult for chronic respiratory failure. Pt is on oxygen most of the time with his POC at 2L. Pt states no inhalers noted at the moment. MRI was done in septemeber and echocardiogram was also done in September.     Referring provider: [name], MD  HPI:   81 y.o. man whom we are seeing in evaluation for hypoxemia.  Discharge summary 02/02/2022 reviewed.  Discharge summary 01/25/2022 reviewed.  Length of need for oxygen is unclear.  He was documented on 2 L nasal cannula at home on discharge summary 01/25/2022.  It sounds like he had oxygen at home is using intermittently, sometimes at night.  Regardless, presented to the ED 01/20/2022.  With chest pressure.  Some concern for NSTEMI.  Mild elevation of troponin.  Blood pressure was elevated.  Placed on heparin.  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  Chest x-ray on admission on my review and interpretation reveals clear lungs with elevated right hemidiaphragm and atelectatic changes.  He presented to the ED 2 days later, 01/27/2022 after discharge.  This was for altered mental status.  He was noted to be tachypneic and hypoxemic again in the ED.  On 5 L from baseline 2 L.  CTA PE protocol my review interpretation shows small right pleural effusion, atelectasis in the right base, otherwise clear lungs.  Procalcitonin was checked and negative.  Antibiotics were discontinued.  His hypoxemia returned to baseline.  EEG was within normal limits.  His mental status improved.  He was discharged home.  PMH: CAD, hyperlipidemia, diabetes Surgical history: Back surgery, VP shunt placement Family history: Father with CAD, mother with dementia, brother with lung cancer Social history: Remote smoking history, [place]   Questionaires / Pulmonary Flowsheets:   ACT:       No data to display          MMRC:     No data to display          Epworth:      No data to display          Tests:   FENO:  No results found for: "NITRICOXIDE"  PFT:     No data to display          WALK:     03/30/2022   11:57 AM  SIX MIN WALK  2 Minute Oxygen Saturation % 95 %  2 Minute HR 73  4 Minute Oxygen Saturation % 97 %  4 Minute HR 75  6 Minute Oxygen Saturation % 98 %  6 Minute HR 74    Imaging: Personally reviewed and as per EMR and discussion in this note No results found.  Lab Results: Personally reviewed CBC    Component Value Date/Time   WBC 7.0 02/01/2022 1010   RBC 4.52 02/01/2022 1010   HGB 14.4 02/01/2022 1010   HCT 44.8 02/01/2022 1010   PLT 179 02/01/2022 1010   MCV 99.1 02/01/2022 1010   MCH 31.9 02/01/2022 1010   MCHC 32.1 02/01/2022 1010   RDW 13.4 02/01/2022 1010   LYMPHSABS 1.0 01/28/2022 0317   MONOABS 0.7 01/28/2022 0317   EOSABS 0.2 01/28/2022 0317   BASOSABS 0.0 01/28/2022 0317    BMET    Component Value Date/Time  NA 145 02/01/2022 1010   K 4.2 02/01/2022 1010   CL 104 02/01/2022 1010   CO2 34 (H) 02/01/2022 1010   GLUCOSE 115 (H) 02/01/2022 1010   BUN 28 (H) 02/01/2022 1010   CREATININE 1.28 (H) 02/01/2022 1010   CREATININE 1.24 (H) 04/09/2018 0947   CALCIUM 9.1 02/01/2022 1010   GFRNONAA 56 (L) 02/01/2022 1010   GFRAA >60 07/13/2019 0622    BNP    Component Value Date/Time   BNP 40.1 08/12/2021 2110    ProBNP No results found for: "PROBNP"  Specialty Problems       Pulmonary Problems   OSA on CPAP   Acute on chronic respiratory failure with hypoxia (HCC)   Chronic respiratory failure with hypoxia, on home O2 therapy (HCC) - 2 L/min prn per patient   HCAP (healthcare-associated pneumonia)    Allergies  Allergen Reactions   Lipitor [Atorvastatin] Other (See Comments)    Hip pain    Immunization History  Administered Date(s) Administered   Influenza, High Dose Seasonal  PF 02/20/2022   Moderna SARS-COV2 Booster Vaccination 06/24/2020, 11/30/2020   Pneumococcal Polysaccharide-23 06/12/2016   Tdap 02/19/2016   Zoster Recombinat (Shingrix) 03/07/2017, 05/18/2017    Past Medical History:  Diagnosis Date   BPH (benign prostatic hyperplasia)    Central retinal artery occlusion    Coronary artery disease    Diabetes mellitus without complication (HCC)    diet controlled   Diverticulosis    History of kidney stones    Hyperlipidemia    Hypertension    OSA on CPAP    wears cpap   Osteoarthritis    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 ALS 04/20/22   Counseling given: Not Answered Tobacco comments: Pt states he quit smoking 60 plus years ago and he remembers smoking maybe a pack a day. ALS 04/20/22   Continue to not smoke  Outpatient Encounter Medications as of 04/20/2022  Medication Sig   acetaminophen (TYLENOL) 500 MG tablet Take 1 tablet (500 mg total) by mouth every 6 (six) hours as needed for mild pain or moderate pain.   aspirin EC 81 MG tablet Take 81 mg by mouth daily.    carvedilol (COREG) 12.5 MG tablet Take 1 tablet (12.5 mg total) by mouth 2 (two) times daily.   cetirizine (ZYRTEC) 10 MG tablet Take 10 mg by mouth daily.   cholecalciferol (VITAMIN D3) 25 MCG (1000 UNIT) tablet Take 1,000 Units by mouth daily.   clopidogrel (PLAVIX) 75 MG tablet Take 1 tablet (75 mg total) by mouth daily with breakfast.    Cyanocobalamin (B-12 PO) Take by mouth.   dapagliflozin propanediol (FARXIGA) 10 MG TABS tablet Take 1 tablet (10 mg total) by mouth daily before breakfast.   ezetimibe (ZETIA) 10 MG tablet Take 10 mg by mouth daily.   meclizine (ANTIVERT) 25 MG tablet Take  1 tablet (25 mg total) by mouth 3 (three) times daily as needed for dizziness.   Melatonin 10 MG TABS Take 10 mg by mouth at bedtime as needed (sleep).   metFORMIN (GLUCOPHAGE-XR) 500 MG 24 hr tablet Take 500 mg by mouth daily.   pantoprazole (PROTONIX) 20 MG tablet Take 20 mg by mouth daily before breakfast.    rosuvastatin (CRESTOR) 10 MG tablet Take 1 tablet (10 mg total) by mouth daily.   vitamin B-12 (CYANOCOBALAMIN) 500 MCG tablet Take 500 mcg by mouth daily.   tamsulosin (FLOMAX) 0.4 MG CAPS capsule Take 1 capsule (0.4 mg total) by mouth daily after supper.   No facility-administered encounter medications on file as of 04/20/2022.     Review of Systems  Review of Systems  No chest pain with exertion.  No orthopnea or PND.  Comprehensive review of systems otherwise negative. Physical Exam  BP 120/62 (BP Location: Left Arm, Patient Position: Sitting, Cuff Size: Normal)   Pulse 79   Ht '5\' 10"'$  (1.778 m)   Wt 193 lb 9.6 oz (87.8 kg)   SpO2 93%   BMI 27.78 kg/m   Wt Readings from Last 5 Encounters:  04/20/22 193 lb 9.6 oz (87.8 kg)  03/30/22 194 lb 7.1 oz (88.2 kg)  02/13/22 193 lb 6.4 oz (87.7 kg)  02/08/22 192 lb (87.1 kg)  02/01/22 190 lb 14.7 oz (86.6 kg)    BMI Readings from Last 5 Encounters:  04/20/22 27.78 kg/m  03/30/22 29.14 kg/m  02/13/22 27.75 kg/m  02/08/22 27.55 kg/m  02/01/22 27.39 kg/m     Physical Exam General: Sitting in chair, in no acute distress Eyes: EOMI, no icterus Neck: Supple, no JVP appreciated sitting upright Pulmonary: Clear, diminished in the right base, normal work of breathing Cardiovascular: Regular rate and rhythm, no murmur Abdomen: Nondistended, bowel sounds  present MSK: No synovitis, no joint effusion Neuro: Slow gait, no weakness Psych: Normal mood, full affect   Assessment & Plan:   Hypoxemic respiratory failure: Transient in nature per description of family when monitoring oxygen saturations at home.  Review of recent chest imaging reveals small right-sided pleural effusion and atelectasis.  As well as chronic right hemidiaphragm elevation on review of serial imaging dating back years.  Suspect shunt physiology particular when sedentary but should improve with incentive spirometry, deep breath, mobilization, exercise.  No other reason for hypoxemia on chest imaging.  Encouraged to continue monitoring oxygen saturation and titrate oxygen as needed for oxygen saturation goal of 88% or higher.  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  Unclear etiology.  No neck trauma, chest imaging, cervical spine surgery.  Idiopathic?  No further work-up recommended at this time.   Return in about 4 months (around 08/19/2022).   Lanier Clam, MD 04/24/2022   This appointment required 65 minutes of patient care (this includes precharting, chart review, review of results, face-to-face care, etc.).

## 2022-04-25 NOTE — Progress Notes (Signed)
Cardiac Individual Treatment Plan  Patient Details  Name: Jeremiah [name] MRN: [id number] Date of Birth: June 08, 1940 Referring Provider:   Flowsheet Row INTENSIVE CARDIAC REHAB ORIENT from 03/30/2022 in [facility]  Referring Provider Peter [name], M.D.       Initial Encounter Date:  [place]cker from 03/30/2022 in [facility]  Date 03/30/22       Visit Diagnosis: 01/24/22 NSTEMI  01/24/22 S/P DES RPDA  Patient's Home Medications on Admission:  Current Outpatient Medications:    acetaminophen (TYLENOL) 500 MG tablet, Take 1 tablet (500 mg total) by mouth every 6 (six) hours as needed for mild pain or moderate pain., Disp: 30 tablet, Rfl: 0   aspirin EC 81 MG tablet, Take 81 mg by mouth daily. , Disp: , Rfl:    carvedilol (COREG) 12.5 MG tablet, Take 1 tablet (12.5 mg total) by mouth 2 (two) times daily., Disp: 180 tablet, Rfl: 1   cetirizine (ZYRTEC) 10 MG tablet, Take 10 mg by mouth daily., Disp: , Rfl:    cholecalciferol (VITAMIN D3) 25 MCG (1000 UNIT) tablet, Take 1,000 Units by mouth daily., Disp: , Rfl:    clopidogrel (PLAVIX) 75 MG tablet, Take 1 tablet (75 mg total) by mouth daily with breakfast., Disp: 90 tablet, Rfl: 2   Cyanocobalamin (B-12 PO), Take by mouth., Disp: , Rfl:    dapagliflozin propanediol (FARXIGA) 10 MG TABS tablet, Take 1 tablet (10 mg total) by mouth daily before breakfast., Disp: 30 tablet, Rfl: 11   ezetimibe (ZETIA) 10 MG tablet, Take 10 mg by mouth daily., Disp: , Rfl:    meclizine (ANTIVERT) 25 MG tablet, Take 1 tablet (25 mg total) by mouth 3 (three) times daily as needed for dizziness., Disp: 30 tablet, Rfl: 0   Melatonin 10 MG TABS, Take 10 mg by mouth at bedtime as needed (sleep)., Disp: , Rfl:    metFORMIN (GLUCOPHAGE-XR) 500 MG 24 hr tablet, Take 500 mg by mouth daily., Disp: , Rfl:    pantoprazole  (PROTONIX) 20 MG tablet, Take 20 mg by mouth daily before breakfast. 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 ALS 04/20/22    Labs: Review Flowsheet  More data exists      Latest Ref Rng & Units 05/28/2021 01/21/2022 01/27/2022 01/31/2022 03/14/2022  Labs for ITP Cardiac and Pulmonary Rehab  Cholestrol 100 - 199 mg/dL - 190  - - 140   LDL (calc) 0 - 99 mg/dL - 97  - - 66   HDL-C >39 mg/dL - 40  - - 44   Trlycerides 0 - 149 mg/dL - 264  - - 181   Hemoglobin A1c 4.8 - 5.6 % 7.2  7.9  - - -  PH, Arterial 7.35 - 7.45 - - - 7.37  -  PCO2 arterial 32 - 48 mmHg - - - 66  -  Bicarbonate 20.0 - 28.0 mmol/L - - 28.8  38.2  -  TCO2 22 - 32 mmol/L - - 30  - -  O2 Saturation % - - 96  97.8  -    Capillary Blood Glucose: Lab Results  Component Value Date   GLUCAP 174 (H) 04/24/2022   GLUCAP 157 (H)  04/10/2022   GLUCAP 167 (H) 04/07/2022   GLUCAP 183 (H) 04/05/2022   GLUCAP 168 (H) 04/03/2022     Exercise Target Goals: Exercise Program Goal: Individual exercise prescription set using results from initial 6 min walk test and THRR while considering  patient's activity barriers and safety.   Exercise Prescription Goal: Initial exercise prescription builds to 30-45 minutes a day of aerobic activity, 2-3 days per week.  Home exercise guidelines will be given to patient during program as part of exercise prescription that the participant will acknowledge.  Activity Barriers & Risk Stratification:  Activity Barriers & Cardiac Risk Stratification - 03/30/22 1204       Activity Barriers & Cardiac Risk Stratification   Activity Barriers Neck/Spine Problems;Deconditioning;Shortness of Breath;Balance Concerns;History of Falls;Other (comment)    Comments Using concentrator / O2 (2 L/min)    Cardiac Risk Stratification High             6 Minute Walk:  6 Minute Walk     Row Name 03/30/22 1157         6 Minute Walk   Phase Initial  Nustep test performed     Distance 1312 feet  (.40 km equiviant on nustep)     Walk Time 6 minutes     # of Rest Breaks 0     MPH 1.94     METS 2.5     RPE 9     Perceived Dyspnea  0     VO2 Peak 6.81     Symptoms No     Resting HR 76 bpm     Resting BP 114/70     Resting Oxygen Saturation  97 %     Exercise Oxygen Saturation  during 6 min walk 98 %     Max Ex. HR 75 bpm     Max Ex. BP 130/80     2 Minute Post BP 122/78       Interval HR   1 Minute HR 76     2 Minute HR 73     3 Minute HR 73     4 Minute HR 75     5 Minute HR 75     6 Minute HR 74     2 Minute Post HR 70     Interval Heart Rate?  Yes       Interval Oxygen   Interval Oxygen? Yes     Baseline Oxygen Saturation % 97 %     1 Minute Oxygen Saturation % 98 %     1 Minute Liters of Oxygen 2 L     2 Minute Oxygen Saturation % 95 %     2 Minute Liters of Oxygen 2 L     3  Minute Oxygen Saturation % 97 %     3 Minute Liters of Oxygen 2 L     4 Minute Oxygen Saturation % 97 %     4 Minute Liters of Oxygen 2 L     5 Minute Oxygen Saturation % 98 %     5 Minute Liters of Oxygen 2 L     6 Minute Oxygen Saturation % 98 %     6 Minute Liters of Oxygen 2 L     2 Minute Post Oxygen Saturation % 99 %     2 Minute Post Liters of Oxygen 2 L              Oxygen Initial Assessment:   Oxygen Re-Evaluation:   Oxygen Discharge (Final Oxygen Re-Evaluation):   Initial Exercise Prescription:  Initial Exercise Prescription - 03/30/22 1200       Date of Initial Exercise RX and Referring Provider   Date 03/30/22    Referring Provider Peter [name], M.D.    Expected Discharge Date 05/26/22      Oxygen   Oxygen Continuous    Liters 2      NuStep   Level 2    SPM 75    Minutes 15    METs 2.5      Arm Ergometer   Level 1    Watts 25    RPM 60    Minutes 15    METs 2.5      Prescription Details   Frequency (times per week) 3    Duration Progress to 30 minutes of continuous aerobic without signs/symptoms of physical distress      Intensity   THRR 40-80% of Max Heartrate 55-110    Ratings of Perceived Exertion 11-13    Perceived Dyspnea 0-4      Progression   Progression Continue progressive overload as per policy without signs/symptoms or physical distress.      Resistance Training   Training Prescription Yes    Weight 2 lbs    Reps 10-15             Perform Capillary Blood Glucose checks as needed.  Exercise Prescription Changes:   Exercise Prescription Changes     Row Name 04/03/22 1500 04/21/22 1600           Response to Exercise   Blood Pressure (Admit) 126/84 116/80      Blood Pressure (Exercise) 142/80 122/80      Blood Pressure (Exit) 110/76 104/72      Heart Rate (Admit) 74 bpm 75 bpm      Heart Rate (Exercise) 88 bpm 104 bpm      Heart Rate (Exit) 80 bpm 81 bpm      Oxygen Saturation (Admit) 97 % 93 %       Oxygen Saturation (Exercise) 97 % 92 %      Oxygen Saturation (Exit) 95 % 92 %      Rating of Perceived Exertion (Exercise) 10 12      Perceived Dyspnea (Exercise) 0 0  Symptoms None None      Comments Pt's first day in the CRP2 program Reviewed METs      Duration Progress to 30 minutes of  aerobic without signs/symptoms of physical distress Continue with 30 min of aerobic exercise without signs/symptoms of physical distress.      Intensity THRR unchanged THRR unchanged        Progression   Progression Continue to progress workloads to maintain intensity without signs/symptoms of physical distress. Continue to progress workloads to maintain intensity without signs/symptoms of physical distress.      Average METs 1.7 2.15        Resistance Training   Training Prescription Yes Yes      Weight 2 lbs 2 lbs      Reps 10-15 10-15      Time 10 Minutes  10 10 Minutes        Interval Training   Interval Training -- No        Oxygen   Oxygen Continuous Continuous      Liters 2 1        NuStep   Level 2 2      SPM 70 102      Minutes 15 15      METs 1.6 2.4        Arm Ergometer   Level 1 2      Watts -- 10      RPM 37 43      Minutes 10 15      METs 1.8 1.9        Oxygen   Maintain Oxygen Saturation -- 88% or higher               Exercise Comments:   Exercise Comments     Row Name 04/03/22 1507 04/21/22 1608         Exercise Comments Pt's first day in the CRP2 program. Pt tolerated the exercise session well without complaints. Reviewed METs. Pt is progressing. Pt had no complaints with today's session.               Exercise Goals and Review:   Exercise Goals     Row Name 03/30/22 1205             Exercise Goals   Increase Physical Activity Yes       Intervention Provide advice, education, support and counseling about physical activity/exercise needs.;Develop an individualized exercise prescription for aerobic and resistive training based on  initial evaluation findings, risk stratification, comorbidities and participant's personal goals.       Expected Outcomes Short Term: Attend rehab on a regular basis to increase amount of physical activity.;Long Term: Add in home exercise to make exercise part of routine and to increase amount of physical activity.;Long Term: Exercising regularly at least 3-5 days a week.       Increase Strength and Stamina Yes       Intervention Provide advice, education, support and counseling about physical activity/exercise needs.;Develop an individualized exercise prescription for aerobic and resistive training based on initial evaluation findings, risk stratification, comorbidities and participant's personal goals.       Expected Outcomes Short Term: Increase workloads from initial exercise prescription for resistance, speed, and METs.;Short Term: Perform resistance training exercises routinely during rehab and add in resistance training at home;Long Term: Improve cardiorespiratory fitness, muscular endurance and strength as measured by increased METs and functional capacity (6MWT)       Able to understand and  use rate of perceived exertion (RPE) scale Yes       Intervention Provide education and explanation on how to use RPE scale       Expected Outcomes Short Term: Able to use RPE daily in rehab to express subjective intensity level;Long Term:  Able to use RPE to guide intensity level when exercising independently       Knowledge and understanding of Target Heart Rate Range (THRR) Yes       Intervention Provide education and explanation of THRR including how the numbers were predicted and where they are located for reference       Expected Outcomes Short Term: Able to state/look up THRR;Short Term: Able to use daily as guideline for intensity in rehab;Long Term: Able to use THRR to govern intensity when exercising independently       Understanding of Exercise Prescription Yes       Intervention Provide education,  explanation, and written materials on patient's individual exercise prescription       Expected Outcomes Short Term: Able to explain program exercise prescription;Long Term: Able to explain home exercise prescription to exercise independently                Exercise Goals Re-Evaluation :  Exercise Goals Re-Evaluation     Row Name 04/03/22 1505             Exercise Goal Re-Evaluation   Exercise Goals Review Increase Physical Activity;Increase Strength and Stamina;Able to understand and use rate of perceived exertion (RPE) scale;Knowledge and understanding of Target Heart Rate Range (THRR);Understanding of Exercise Prescription       Comments Pt's first day in the CRP2 program. Pt understands the exercise Rx, RPE scale, and THRR>       Expected Outcomes Will continue to monitor patients and progress exercise workloads as tolerated.                Discharge Exercise Prescription (Final Exercise Prescription Changes):  Exercise Prescription Changes - 04/21/22 1600       Response to Exercise   Blood Pressure (Admit) 116/80    Blood Pressure (Exercise) 122/80    Blood Pressure (Exit) 104/72    Heart Rate (Admit) 75 bpm    Heart Rate (Exercise) 104 bpm    Heart Rate (Exit) 81 bpm    Oxygen Saturation (Admit) 93 %    Oxygen Saturation (Exercise) 92 %    Oxygen Saturation (Exit) 92 %    Rating of Perceived Exertion (Exercise) 12    Perceived Dyspnea (Exercise) 0    Symptoms None    Comments Reviewed METs    Duration Continue with 30 min of aerobic exercise without signs/symptoms of physical distress.    Intensity THRR unchanged      Progression   Progression Continue to progress workloads to maintain intensity without signs/symptoms of physical distress.    Average METs 2.15      Resistance Training   Training Prescription Yes    Weight 2 lbs    Reps 10-15    Time 10 Minutes      Interval Training   Interval Training No      Oxygen   Oxygen Continuous    Liters  1      NuStep   Level 2    SPM 102    Minutes 15    METs 2.4      Arm Ergometer   Level 2    Watts 10    RPM 43  Minutes 15    METs 1.9      Oxygen   Maintain Oxygen Saturation 88% or higher             Nutrition:  Target Goals: Understanding of nutrition guidelines, daily intake of sodium '1500mg'$ , cholesterol '200mg'$ , calories 30% from fat and 7% or less from saturated fats, daily to have 5 or more servings of fruits and vegetables.  Biometrics:  Pre Biometrics - 03/30/22 0930       Pre Biometrics   Waist Circumference 45.5 inches    Hip Circumference 41.25 inches    Waist to Hip Ratio 1.1 %    Triceps Skinfold 10 mm    % Body Fat 29.5 %    Grip Strength 24 kg    Flexibility --   No performed due to back issues   Single Leg Stand 2.43 seconds              Nutrition Therapy Plan and Nutrition Goals:  Nutrition Therapy & Goals - 04/03/22 1316       Nutrition Therapy   Diet Heart Healthy/Carbohydrate Consistent    Drug/Food Interactions Statins/Certain Fruits      Personal Nutrition Goals   Nutrition Goal Patient to identify strateiges for managing cardiovascular risk by attending the weekly Pritikin education and nutrition courses.    Personal Goal #2 Patient to reduce sodium intake to '1500mg'$  per day.    Personal Goal #3 Patient to identify food sources and limit daily intake of refined carbohydrates, saturated fat, trans fat, and sodium.    Comments Tayshun has medical history of hyperlipidemia, HTN,  Congestive heart failure, NSTEMI, DM2, acute respiratory failure.  Della is the primary caretaker for his wife how has dementia. He does very little cooking and reports eating out frequently or choosing many convenience meals. He eats 1-2 meals per day. He is motivated to lose weight.      Intervention Plan   Intervention Prescribe, educate and counsel regarding individualized specific dietary modifications aiming towards targeted core components such as  weight, hypertension, lipid management, diabetes, heart failure and other comorbidities.;Nutrition handout(s) given to patient.    Expected Outcomes Short Term Goal: Understand basic principles of dietary content, such as calories, fat, sodium, cholesterol and nutrients.;Long Term Goal: Adherence to prescribed nutrition plan.             Nutrition Assessments:  MEDIFICTS Score Key: ?70 Need to make dietary changes  40-70 Heart Healthy Diet ? 40 Therapeutic Level Cholesterol Diet    Picture Your Plate Scores: <73 Unhealthy dietary pattern with much room for improvement. 41-50 Dietary pattern unlikely to meet recommendations for good health and room for improvement. 51-60 More healthful dietary pattern, with some room for improvement.  >60 Healthy dietary pattern, although there may be some specific behaviors that could be improved.    Nutrition Goals Re-Evaluation:  Nutrition Goals Re-Evaluation     [place] Name 04/03/22 1316             Goals   Current Weight 192 lb 3.9 oz (87.2 kg)       Comment trigylcerides 181, A1c 7.9       Expected Outcome Eugenio has medical history of hyperlipidemia, HTN, Congestive heart failure, NSTEMI, DM2, acute respiratory failure. Eyob is the primary caretaker for his wife who has dementia. He does very little cooking and reports eating out frequently or choosing many convenience meals. He eats 1-2 meals per day. He is motivated to lose weight. Shanon Brow  will benefit from adhereance to the Pritikin eating plan to aid with improvement to triglycerides '149mg'$ /dL, A1c <7%, and sodium intake '1500mg'$  daily.                Nutrition Goals Re-Evaluation:  Nutrition Goals Re-Evaluation     [place] Name 04/03/22 1316             Goals   Current Weight 192 lb 3.9 oz (87.2 kg)       Comment trigylcerides 181, A1c 7.9       Expected Outcome Jentry has medical history of hyperlipidemia, HTN, Congestive heart failure, NSTEMI, DM2, acute respiratory failure.  Omarius is the primary caretaker for his wife who has dementia. He does very little cooking and reports eating out frequently or choosing many convenience meals. He eats 1-2 meals per day. He is motivated to lose weight. Antwane will benefit from adhereance to the Pritikin eating plan to aid with improvement to triglycerides '149mg'$ /dL, A1c <7%, and sodium intake '1500mg'$  daily.                Nutrition Goals Discharge (Final Nutrition Goals Re-Evaluation):  Nutrition Goals Re-Evaluation - 04/03/22 1316       Goals   Current Weight 192 lb 3.9 oz (87.2 kg)    Comment trigylcerides 181, A1c 7.9    Expected Outcome Amere has medical history of hyperlipidemia, HTN, Congestive heart failure, NSTEMI, DM2, acute respiratory failure. Jahquan is the primary caretaker for his wife who has dementia. He does very little cooking and reports eating out frequently or choosing many convenience meals. He eats 1-2 meals per day. He is motivated to lose weight. Merek will benefit from adhereance to the Pritikin eating plan to aid with improvement to triglycerides '149mg'$ /dL, A1c <7%, and sodium intake '1500mg'$  daily.             Psychosocial: Target Goals: Acknowledge presence or absence of significant depression and/or stress, maximize coping skills, provide positive support system. Participant is able to verbalize types and ability to use techniques and skills needed for reducing stress and depression.  Initial Review & Psychosocial Screening:  Initial Psych Review & Screening - 03/30/22 1502       Initial Review   Current issues with Current Stress Concerns    Source of Stress Concerns Family;Chronic Illness    Comments Dave's wife has demetia.      Family Dynamics   Good Support System? Yes   Jovi has his wife, daughter and caregivers who assist with the patient and his wife's care twice a week     Barriers   Psychosocial barriers to participate in program The patient should benefit from training in  stress management and relaxation.      Screening Interventions   Interventions Encouraged to exercise    Expected Outcomes Long Term Goal: Stressors or current issues are controlled or eliminated.             Quality of Life Scores:  Quality of Life - 03/30/22 1150       Quality of Life   Select Quality of Life      Quality of Life Scores   Health/Function Pre 28.4 %    Socioeconomic Pre 30 %    Psych/Spiritual Pre 30 %    Family Pre 30 %    GLOBAL Pre 29.27 %            Scores of 19 and below usually indicate a poorer quality of life in  these areas.  A difference of  2-3 points is a clinically meaningful difference.  A difference of 2-3 points in the total score of the Quality of Life Index has been associated with significant improvement in overall quality of life, self-image, physical symptoms, and general health in studies assessing change in quality of life.  PHQ-9: Review Flowsheet       04/03/2022 07/21/2014 03/10/2014  Depression screen PHQ 2/9  Decreased Interest 0 0 0  Down, Depressed, Hopeless 0 0 0  PHQ - 2 Score 0 0 0   Interpretation of Total Score  Total Score Depression Severity:  1-4 = Minimal depression, 5-9 = Mild depression, 10-14 = Moderate depression, 15-19 = Moderately severe depression, 20-27 = Severe depression   Psychosocial Evaluation and Intervention:   Psychosocial Re-Evaluation:  Psychosocial Re-Evaluation     [place] Name 04/03/22 1322 04/25/22 1507           Psychosocial Re-Evaluation   Current issues with Current Stress Concerns Current Stress Concerns      Comments Waunita Schooner did not voice any increased concerns or stressors on his first day of exercise. Dave's wife accompanied him to exercise today Waunita Schooner has not  voice any increased concerns or stressors during exercise. Waunita Schooner has good support from his wife and family      Expected Outcomes Waunita Schooner will have controlled or decreased stress upon completion of intensive cardiac rehab Waunita Schooner  will have controlled or decreased stress upon completion of intensive cardiac rehab      Interventions Stress management education;Encouraged to attend Cardiac Rehabilitation for the exercise Stress management education;Encouraged to attend Cardiac Rehabilitation for the exercise      Continue Psychosocial Services  Follow up required by staff Follow up required by staff        Initial Review   Source of Stress Concerns Chronic Illness;Family Chronic Illness;Family      Comments Will continue to monitor and offer support as needed. Will continue to monitor and offer support as needed.               Psychosocial Discharge (Final Psychosocial Re-Evaluation):  Psychosocial Re-Evaluation - 04/25/22 1507       Psychosocial Re-Evaluation   Current issues with Current Stress Concerns    Comments Waunita Schooner has not  voice any increased concerns or stressors during exercise. Waunita Schooner has good support from his wife and family    Expected Outcomes Waunita Schooner will have controlled or decreased stress upon completion of intensive cardiac rehab    Interventions Stress management education;Encouraged to attend Cardiac Rehabilitation for the exercise    Continue Psychosocial Services  Follow up required by staff      Initial Review   Source of Stress Concerns Chronic Illness;Family    Comments Will continue to monitor and offer support as needed.             Vocational Rehabilitation: Provide vocational rehab assistance to qualifying candidates.   Vocational Rehab Evaluation & Intervention:  Vocational Rehab - 03/30/22 1545       Initial Vocational Rehab Evaluation & Intervention   Assessment shows need for Vocational Rehabilitation No   Waunita Schooner is retired and does not need vocational rehab at this time            Education: Education Goals: Education classes will be provided on a weekly basis, covering required topics. Participant will state understanding/return demonstration of topics presented.     Education     Row Name 04/07/22 1500  Education   Cardiac Education Topics Pritikin   Lexicographer Nutrition   Nutrition Calorie Density   Instruction Review Code 1- Verbalizes Understanding   Class Start Time 1610   Class Stop Time 1430   Class Time Calculation (min) 35 min    [place] Name 04/10/22 1500     Education   Cardiac Education Topics [place]   Environmental consultant Exercise   Exercise Workshop Exercise Basics: Building Your Action Plan   Instruction Review Code 1- Verbalizes Understanding   Class Start Time 1404   Class Stop Time 1459   Class Time Calculation (min) 55 min    [place] Name 04/12/22 1500     Education   Cardiac Education Topics Pritikin   Financial trader   Weekly Topic Efficiency Cooking - Meals in a Snap   Instruction Review Code 1- Verbalizes Understanding   Class Start Time 9604   Class Stop Time 1452   Class Time Calculation (min) 57 min    [place] Name 04/17/22 1600     Education   Cardiac Education Topics Pritikin   Charity fundraiser Exercise Physiologist   Select Nutrition   Nutrition Nutrition Action Plan   Instruction Review Code 1- Verbalizes Understanding   Class Start Time 1400   Class Stop Time 1435   Class Time Calculation (min) 35 min    [place] Name 04/19/22 1500     Education   Cardiac Education Topics Pritikin   Financial trader   Weekly Topic Fast and Healthy Breakfasts   Instruction Review Code 1- Verbalizes Understanding   Class Start Time 1400   Class Stop Time 1450   Class Time Calculation (min) 50 min    [place] Name 04/24/22 1500     Education   Cardiac Education Topics Pritikin   Education officer, community --   Select --   Psychosocial --   Instruction Review  Code --   Class Start Time --   Class Stop Time --   Class Time Calculation (min) --     Workshops   Dentist Psychosocial   Exercise Workshop Other  From [employer] to Heart:  The Power of a Healthy Outlook   Instruction Review Code 1- Verbalizes Understanding   Class Start Time 1406   Class Stop Time 1500   Class Time Calculation (min) 54 min            Core Videos: Exercise    Move It!  Clinical staff conducted group or individual video education with verbal and written material and guidebook.  Patient learns the recommended Pritikin exercise program. Exercise with the goal of living a long, healthy life. Some of the health benefits of exercise include controlled diabetes, healthier blood pressure levels, improved cholesterol levels, improved heart and lung capacity, improved sleep, and better body composition. Everyone should speak with their doctor before starting or changing an exercise routine.  Biomechanical Limitations Clinical staff conducted group or individual video education with verbal and written material and guidebook.  Patient learns how biomechanical limitations can impact exercise and how we can mitigate and possibly overcome  limitations to have an impactful and balanced exercise routine.  Body Composition Clinical staff conducted group or individual video education with verbal and written material and guidebook.  Patient learns that body composition (ratio of muscle mass to fat mass) is a key component to assessing overall fitness, rather than body weight alone. Increased fat mass, especially visceral belly fat, can put [place] at increased risk for metabolic syndrome, type 2 diabetes, heart disease, and even death. It is recommended to combine diet and exercise (cardiovascular and resistance training) to improve your body composition. Seek guidance from your physician and exercise physiologist before implementing an exercise  routine.  Exercise Action Plan Clinical staff conducted group or individual video education with verbal and written material and guidebook.  Patient learns the recommended strategies to achieve and enjoy long-term exercise adherence, including variety, self-motivation, self-efficacy, and positive decision making. Benefits of exercise include fitness, good health, weight management, more energy, better sleep, less stress, and overall well-being.  Medical   Heart Disease Risk Reduction Clinical staff conducted group or individual video education with verbal and written material and guidebook.  Patient learns our heart is our most vital organ as it circulates oxygen, nutrients, white blood cells, and hormones throughout the entire body, and carries waste away. Data supports a plant-based eating plan like the Pritikin Program for its effectiveness in slowing progression of and reversing heart disease. The video provides a number of recommendations to address heart disease.   Metabolic Syndrome and Belly Fat  Clinical staff conducted group or individual video education with verbal and written material and guidebook.  Patient learns what metabolic syndrome is, how it leads to heart disease, and how one can reverse it and keep it from coming back. You have metabolic syndrome if you have 3 of the following 5 criteria: abdominal obesity, high blood pressure, high triglycerides, low HDL cholesterol, and high blood sugar.  Hypertension and Heart Disease Clinical staff conducted group or individual video education with verbal and written material and guidebook.  Patient learns that high blood pressure, or hypertension, is very common in the [place]. Hypertension is largely due to excessive salt intake, but other important risk factors include being overweight, physical inactivity, drinking too much alcohol, smoking, and not eating enough potassium from fruits and vegetables. High blood pressure is a  leading risk factor for heart attack, stroke, congestive heart failure, dementia, kidney failure, and premature death. Long-term effects of excessive salt intake include stiffening of the arteries and thickening of heart muscle and organ damage. Recommendations include ways to reduce hypertension and the risk of heart disease.  Diseases of Our Time - Focusing on Diabetes Clinical staff conducted group or individual video education with verbal and written material and guidebook.  Patient learns why the best way to stop diseases of our time is prevention, through food and other lifestyle changes. Medicine (such as prescription pills and surgeries) is often only a Band-Aid on the problem, not a long-term solution. Most common diseases of our time include obesity, type 2 diabetes, hypertension, heart disease, and cancer. The Pritikin Program is recommended and has been proven to help reduce, reverse, and/or prevent the damaging effects of metabolic syndrome.  Nutrition   Overview of the Pritikin Eating Plan  Clinical staff conducted group or individual video education with verbal and written material and guidebook.  Patient learns about the [place] for disease risk reduction. The [place] emphasizes a wide variety of unrefined, minimally-processed carbohydrates, like fruits, vegetables, whole  grains, and legumes. Go, Caution, and Stop food choices are explained. Plant-based and lean animal proteins are emphasized. Rationale provided for low sodium intake for blood pressure control, low added sugars for blood sugar stabilization, and low added fats and oils for coronary artery disease risk reduction and weight management.  Calorie Density  Clinical staff conducted group or individual video education with verbal and written material and guidebook.  Patient learns about calorie density and how it impacts the Pritikin Eating Plan. Knowing the characteristics of the food you choose will  help you decide whether those foods will lead to weight gain or weight loss, and whether you want to consume more or less of them. Weight loss is usually a side effect of the Pritikin Eating Plan because of its focus on low calorie-dense foods.  Label Reading  Clinical staff conducted group or individual video education with verbal and written material and guidebook.  Patient learns about the Pritikin recommended label reading guidelines and corresponding recommendations regarding calorie density, added sugars, sodium content, and whole grains.  Dining Out - Part 1  Clinical staff conducted group or individual video education with verbal and written material and guidebook.  Patient learns that restaurant meals can be sabotaging because they can be so high in calories, fat, sodium, and/or sugar. Patient learns recommended strategies on how to positively address this and avoid unhealthy pitfalls.  Facts on Fats  Clinical staff conducted group or individual video education with verbal and written material and guidebook.  Patient learns that lifestyle modifications can be just as effective, if not more so, as many medications for lowering your risk of heart disease. A Pritikin lifestyle can help to reduce your risk of inflammation and atherosclerosis (cholesterol build-up, or plaque, in the artery walls). Lifestyle interventions such as dietary choices and physical activity address the cause of atherosclerosis. A review of the types of fats and their impact on blood cholesterol levels, along with dietary recommendations to reduce fat intake is also included.  Nutrition Action Plan  Clinical staff conducted group or individual video education with verbal and written material and guidebook.  Patient learns how to incorporate Pritikin recommendations into their lifestyle. Recommendations include planning and keeping personal health goals in mind as an important part of their success.  Healthy Mind-Set     Healthy Minds, Bodies, Hearts  Clinical staff conducted group or individual video education with verbal and written material and guidebook.  Patient learns how to identify when they are stressed. Video will discuss the impact of that stress, as well as the many benefits of stress management. Patient will also be introduced to stress management techniques. The way we think, act, and feel has an impact on our hearts.  How Our Thoughts Can Heal Our Hearts  Clinical staff conducted group or individual video education with verbal and written material and guidebook.  Patient learns that negative thoughts can cause depression and anxiety. This can result in negative lifestyle behavior and serious health problems. Cognitive behavioral therapy is an effective method to help control our thoughts in order to change and improve our emotional outlook.  Additional Videos:  Exercise    Improving Performance  Clinical staff conducted group or individual video education with verbal and written material and guidebook.  Patient learns to use a non-linear approach by alternating intensity levels and lengths of time spent exercising to help burn more calories and lose more body fat. Cardiovascular exercise helps improve heart health, metabolism, hormonal balance, blood sugar control, and  recovery from fatigue. Resistance training improves strength, endurance, balance, coordination, reaction time, metabolism, and muscle mass. Flexibility exercise improves circulation, posture, and balance. Seek guidance from your physician and exercise physiologist before implementing an exercise routine and learn your capabilities and proper form for all exercise.  Introduction to Yoga  Clinical staff conducted group or individual video education with verbal and written material and guidebook.  Patient learns about yoga, a discipline of the coming together of mind, breath, and body. The benefits of yoga include improved flexibility,  improved range of motion, better posture and core strength, increased lung function, weight loss, and positive self-image. Yoga's heart health benefits include lowered blood pressure, healthier heart rate, decreased cholesterol and triglyceride levels, improved immune function, and reduced stress. Seek guidance from your physician and exercise physiologist before implementing an exercise routine and learn your capabilities and proper form for all exercise.  Medical   Aging: Enhancing Your Quality of Life  Clinical staff conducted group or individual video education with verbal and written material and guidebook.  Patient learns key strategies and recommendations to stay in good physical health and enhance quality of life, such as prevention strategies, having an advocate, securing a [place], and keeping a list of medications and system for tracking them. It also discusses how to avoid risk for bone loss.  Biology of Weight Control  Clinical staff conducted group or individual video education with verbal and written material and guidebook.  Patient learns that weight gain occurs because we consume more calories than we burn (eating more, moving less). Even if your body weight is normal, you may have higher ratios of fat compared to muscle mass. Too much body fat puts you at increased risk for cardiovascular disease, heart attack, stroke, type 2 diabetes, and obesity-related cancers. In addition to exercise, following the [place] can help reduce your risk.  Decoding Lab Results  Clinical staff conducted group or individual video education with verbal and written material and guidebook.  Patient learns that lab test reflects one measurement whose values change over time and are influenced by many factors, including medication, stress, sleep, exercise, food, hydration, pre-existing medical conditions, and more. It is recommended to use the knowledge from this  video to become more involved with your lab results and evaluate your numbers to speak with your doctor.   Diseases of Our Time - Overview  Clinical staff conducted group or individual video education with verbal and written material and guidebook.  Patient learns that according to the CDC, 50% to 70% of chronic diseases (such as obesity, type 2 diabetes, elevated lipids, hypertension, and heart disease) are avoidable through lifestyle improvements including healthier food choices, listening to satiety cues, and increased physical activity.  Sleep Disorders Clinical staff conducted group or individual video education with verbal and written material and guidebook.  Patient learns how good quality and duration of sleep are important to overall health and well-being. Patient also learns about sleep disorders and how they impact health along with recommendations to address them, including discussing with a physician.  Nutrition  Dining Out - Part 2 Clinical staff conducted group or individual video education with verbal and written material and guidebook.  Patient learns how to plan ahead and communicate in order to maximize their dining experience in a healthy and nutritious manner. Included are recommended food choices based on the type of restaurant the patient is visiting.   Fueling a Best boy conducted  group or individual video education with verbal and written material and guidebook.  There is a strong connection between our food choices and our health. Diseases like obesity and type 2 diabetes are very prevalent and are in large-part due to lifestyle choices. The Pritikin Eating Plan provides plenty of food and hunger-curbing satisfaction. It is easy to follow, affordable, and helps reduce health risks.  Menu Workshop  Clinical staff conducted group or individual video education with verbal and written material and guidebook.  Patient learns that restaurant meals can  sabotage health goals because they are often packed with calories, fat, sodium, and sugar. Recommendations include strategies to plan ahead and to communicate with the manager, chef, or server to help order a healthier meal.  Planning Your Eating Strategy  Clinical staff conducted group or individual video education with verbal and written material and guidebook.  Patient learns about the [place] and its benefit of reducing the risk of disease. The [place] does not focus on calories. Instead, it emphasizes high-quality, nutrient-rich foods. By knowing the characteristics of the foods, we choose, we can determine their calorie density and make informed decisions.  Targeting Your Nutrition Priorities  Clinical staff conducted group or individual video education with verbal and written material and guidebook.  Patient learns that lifestyle habits have a tremendous impact on disease risk and progression. This video provides eating and physical activity recommendations based on your personal health goals, such as reducing LDL cholesterol, losing weight, preventing or controlling type 2 diabetes, and reducing high blood pressure.  Vitamins and Minerals  Clinical staff conducted group or individual video education with verbal and written material and guidebook.  Patient learns different ways to obtain key vitamins and minerals, including through a recommended healthy diet. It is important to discuss all supplements you take with your doctor.   Healthy Mind-Set    Smoking Cessation  Clinical staff conducted group or individual video education with verbal and written material and guidebook.  Patient learns that cigarette smoking and tobacco addiction pose a serious health risk which affects millions of people. Stopping smoking will significantly reduce the risk of heart disease, lung disease, and many forms of cancer. Recommended strategies for quitting are covered, including  working with your doctor to develop a successful plan.  Culinary   Becoming a Financial trader conducted group or individual video education with verbal and written material and guidebook.  Patient learns that cooking at home can be healthy, cost-effective, quick, and puts them in control. Keys to cooking healthy recipes will include looking at your recipe, assessing your equipment needs, planning ahead, making it simple, choosing cost-effective seasonal ingredients, and limiting the use of added fats, salts, and sugars.  Cooking - Breakfast and Snacks  Clinical staff conducted group or individual video education with verbal and written material and guidebook.  Patient learns how important breakfast is to satiety and nutrition through the entire day. Recommendations include key foods to eat during breakfast to help stabilize blood sugar levels and to prevent overeating at meals later in the day. Planning ahead is also a key component.  Cooking - Human resources officer conducted group or individual video education with verbal and written material and guidebook.  Patient learns eating strategies to improve overall health, including an approach to cook more at home. Recommendations include thinking of animal protein as a side on your plate rather than center stage and focusing instead on lower calorie dense options  like vegetables, fruits, whole grains, and plant-based proteins, such as beans. Making sauces in large quantities to freeze for later and leaving the skin on your vegetables are also recommended to maximize your experience.  Cooking - Healthy Salads and Dressing Clinical staff conducted group or individual video education with verbal and written material and guidebook.  Patient learns that vegetables, fruits, whole grains, and legumes are the foundations of the [place]. Recommendations include how to incorporate each of these in flavorful and healthy  salads, and how to create homemade salad dressings. Proper handling of ingredients is also covered. Cooking - Soups and [employer] - Soups and Desserts Clinical staff conducted group or individual video education with verbal and written material and guidebook.  Patient learns that Pritikin soups and desserts make for easy, nutritious, and delicious snacks and meal components that are low in sodium, fat, sugar, and calorie density, while high in vitamins, minerals, and filling fiber. Recommendations include simple and healthy ideas for soups and desserts.   Overview     The Pritikin Solution Program Overview Clinical staff conducted group or individual video education with verbal and written material and guidebook.  Patient learns that the results of the [place] Program have been documented in more than 100 articles published in peer-reviewed journals, and the benefits include reducing risk factors for (and, in some cases, even reversing) high cholesterol, high blood pressure, type 2 diabetes, obesity, and more! An overview of the three key pillars of the Pritikin Program will be covered: eating well, doing regular exercise, and having a healthy mind-set.  WORKSHOPS  Exercise: Exercise Basics: Building Your Action Plan Clinical staff led group instruction and group discussion with PowerPoint presentation and patient guidebook. To enhance the learning environment the use of posters, models and videos may be added. At the conclusion of this workshop, patients will comprehend the difference between physical activity and exercise, as well as the benefits of incorporating both, into their routine. Patients will understand the FITT (Frequency, Intensity, Time, and Type) principle and how to use it to build an exercise action plan. In addition, safety concerns and other considerations for exercise and cardiac rehab will be addressed by the presenter. The purpose of this lesson is to promote a  comprehensive and effective weekly exercise routine in order to improve patients' overall level of fitness.   Managing Heart Disease: Your Path to a Healthier Heart Clinical staff led group instruction and group discussion with PowerPoint presentation and patient guidebook. To enhance the learning environment the use of posters, models and videos may be added.At the conclusion of this workshop, patients will understand the anatomy and physiology of the heart. Additionally, they will understand how Pritikin's three pillars impact the risk factors, the progression, and the management of heart disease.  The purpose of this lesson is to provide a high-level overview of the heart, heart disease, and how the Pritikin lifestyle positively impacts risk factors.  Exercise Biomechanics Clinical staff led group instruction and group discussion with PowerPoint presentation and patient guidebook. To enhance the learning environment the use of posters, models and videos may be added. Patients will learn how the structural parts of their bodies function and how these functions impact their daily activities, movement, and exercise. Patients will learn how to promote a neutral spine, learn how to manage pain, and identify ways to improve their physical movement in order to promote healthy living. The purpose of this lesson is to expose patients to common physical limitations that  impact physical activity. Participants will learn practical ways to adapt and manage aches and pains, and to minimize their effect on regular exercise. Patients will learn how to maintain good posture while sitting, walking, and lifting.  Balance Training and Fall Prevention  Clinical staff led group instruction and group discussion with PowerPoint presentation and patient guidebook. To enhance the learning environment the use of posters, models and videos may be added. At the conclusion of this workshop, patients will understand the  importance of their sensorimotor skills (vision, proprioception, and the vestibular system) in maintaining their ability to balance as they age. Patients will apply a variety of balancing exercises that are appropriate for their current level of function. Patients will understand the common causes for poor balance, possible solutions to these problems, and ways to modify their physical environment in order to minimize their fall risk. The purpose of this lesson is to teach patients about the importance of maintaining balance as they age and ways to minimize their risk of falling.  WORKSHOPS   Nutrition:  Fueling a Scientist, research (physical sciences) led group instruction and group discussion with PowerPoint presentation and patient guidebook. To enhance the learning environment the use of posters, models and videos may be added. Patients will review the foundational principles of the [place] and understand what constitutes a serving size in each of the food groups. Patients will also learn Pritikin-friendly foods that are better choices when away from home and review make-ahead meal and snack options. Calorie density will be reviewed and applied to three nutrition priorities: weight maintenance, weight loss, and weight gain. The purpose of this lesson is to reinforce (in a group setting) the key concepts around what patients are recommended to eat and how to apply these guidelines when away from home by planning and selecting Pritikin-friendly options. Patients will understand how calorie density may be adjusted for different weight management goals.  Mindful Eating  Clinical staff led group instruction and group discussion with PowerPoint presentation and patient guidebook. To enhance the learning environment the use of posters, models and videos may be added. Patients will briefly review the concepts of the [place] and the importance of low-calorie dense foods. The concept of mindful  eating will be introduced as well as the importance of paying attention to internal hunger signals. Triggers for non-hunger eating and techniques for dealing with triggers will be explored. The purpose of this lesson is to provide patients with the opportunity to review the basic principles of the [place], discuss the value of eating mindfully and how to measure internal cues of hunger and fullness using the Hunger Scale. Patients will also discuss reasons for non-hunger eating and learn strategies to use for controlling emotional eating.  Targeting Your Nutrition Priorities Clinical staff led group instruction and group discussion with PowerPoint presentation and patient guidebook. To enhance the learning environment the use of posters, models and videos may be added. Patients will learn how to determine their genetic susceptibility to disease by reviewing their family history. Patients will gain insight into the importance of diet as part of an overall healthy lifestyle in mitigating the impact of genetics and other environmental insults. The purpose of this lesson is to provide patients with the opportunity to assess their personal nutrition priorities by looking at their family history, their own health history and current risk factors. Patients will also be able to discuss ways of prioritizing and modifying the [place] for their highest risk  areas  Menu  Clinical staff led group instruction and group discussion with PowerPoint presentation and patient guidebook. To enhance the learning environment the use of posters, models and videos may be added. Using menus brought in from [employer], or printed from [employer], patients will apply the [place] dining out guidelines that were presented in the [employer] video. Patients will also be able to practice these guidelines in a variety of provided scenarios. The purpose of this lesson is to provide  patients with the opportunity to practice hands-on learning of the [place] with actual menus and practice scenarios.  Label Reading Clinical staff led group instruction and group discussion with PowerPoint presentation and patient guidebook. To enhance the learning environment the use of posters, models and videos may be added. Patients will review and discuss the [facility] label reading guidelines presented in [employer]'s Label Reading Educational series video. Using fool labels brought in from local grocery stores and markets, patients will apply the label reading guidelines and determine if the packaged food meet the [facility] guidelines. The purpose of this lesson is to provide patients with the opportunity to review, discuss, and practice hands-on learning of the [facility] Label Reading guidelines with actual packaged food labels. [place] Workshops are designed to teach patients ways to prepare quick, simple, and affordable recipes at home. The importance of nutrition's role in chronic disease risk reduction is reflected in its emphasis in the overall [facility] program. By learning how to prepare essential core [facility] Eating Plan recipes, patients will increase control over what they eat; be able to customize the flavor of foods without the use of added salt, sugar, or fat; and improve the quality of the food they consume. By learning a set of core recipes which are easily assembled, quickly prepared, and affordable, patients are more likely to prepare more healthy foods at home. These workshops focus on convenient breakfasts, simple entres, side dishes, and desserts which can be prepared with minimal effort and are consistent with nutrition recommendations for cardiovascular risk reduction. Cooking [employer] are taught by a Engineer, materials (RD) who has been trained by the [employer]. The chef or RD has a clear  understanding of the importance of minimizing - if not completely eliminating - added fat, sugar, and sodium in recipes. Throughout the series of [place] Workshop sessions, patients will learn about healthy ingredients and efficient methods of cooking to build confidence in their capability to prepare    Cooking School weekly topics:  Adding Flavor- Sodium-Free  Fast and Healthy Breakfasts  Powerhouse Plant-Based Proteins  Satisfying Salads and Dressings  Simple Sides and Sauces  International Cuisine-Spotlight on the [place] Zones  Delicious Desserts  Savory Soups  Efficiency Cooking - Meals in a Snap  Tasty Appetizers and Snacks  Comforting Weekend Breakfasts  One-Pot Wonders   Fast Evening Meals  Easy [place] (Psychosocial): New Thoughts, New Behaviors Clinical staff led group instruction and group discussion with PowerPoint presentation and patient guidebook. To enhance the learning environment the use of posters, models and videos may be added. Patients will learn and practice techniques for developing effective health and lifestyle goals. Patients will be able to effectively apply the goal setting process learned to develop at least one new personal goal.  The purpose of this lesson is to expose patients to a new skill set of behavior modification  techniques such as techniques setting SMART goals, overcoming barriers, and achieving new thoughts and new behaviors.  Managing Moods and Relationships Clinical staff led group instruction and group discussion with PowerPoint presentation and patient guidebook. To enhance the learning environment the use of posters, models and videos may be added. Patients will learn how emotional and chronic stress factors can impact their health and relationships. They will learn healthy ways to manage their moods and utilize positive coping mechanisms. In addition, ICR patients  will learn ways to improve communication skills. The purpose of this lesson is to expose patients to ways of understanding how one's mood and health are intimately connected. Developing a healthy outlook can help build positive relationships and connections with others. Patients will understand the importance of utilizing effective communication skills that include actively listening and being heard. They will learn and understand the importance of the "4 Cs" and especially Connections in fostering of a Healthy Mind-Set.  Healthy Sleep for a Healthy Heart Clinical staff led group instruction and group discussion with PowerPoint presentation and patient guidebook. To enhance the learning environment the use of posters, models and videos may be added. At the conclusion of this workshop, patients will be able to demonstrate knowledge of the importance of sleep to overall health, well-being, and quality of life. They will understand the symptoms of, and treatments for, common sleep disorders. Patients will also be able to identify daytime and nighttime behaviors which impact sleep, and they will be able to apply these tools to help manage sleep-related challenges. The purpose of this lesson is to provide patients with a general overview of sleep and outline the importance of quality sleep. Patients will learn about a few of the most common sleep disorders. Patients will also be introduced to the concept of "sleep hygiene," and discover ways to self-manage certain sleeping problems through simple daily behavior changes. Finally, the workshop will motivate patients by clarifying the links between quality sleep and their goals of heart-healthy living.   Recognizing and Reducing Stress Clinical staff led group instruction and group discussion with PowerPoint presentation and patient guidebook. To enhance the learning environment the use of posters, models and videos may be added. At the conclusion of this workshop,  patients will be able to understand the types of stress reactions, differentiate between acute and chronic stress, and recognize the impact that chronic stress has on their health. They will also be able to apply different coping mechanisms, such as reframing negative self-talk. Patients will have the opportunity to practice a variety of stress management techniques, such as deep abdominal breathing, progressive muscle relaxation, and/or guided imagery.  The purpose of this lesson is to educate patients on the role of stress in their lives and to provide healthy techniques for coping with it.  Learning Barriers/Preferences:  Learning Barriers/Preferences - 03/30/22 1152       Learning Barriers/Preferences   Learning Barriers Sight;Hearing    Learning Preferences Audio;Verbal Instruction;Computer/Internet;Video;Group Instruction;Written Material;Individual Instruction;Skilled Demonstration;Pictoral             Education Topics:  Knowledge Questionnaire Score:  Knowledge Questionnaire Score - 03/30/22 1153       Knowledge Questionnaire Score   Pre Score 21/24             Core Components/Risk Factors/Patient Goals at Admission:  Personal Goals and Risk Factors at Admission - 03/30/22 1154       Core Components/Risk Factors/Patient Goals on Admission    Weight Management Yes;Weight Loss  Intervention Weight Management: Develop a combined nutrition and exercise program designed to reach desired caloric intake, while maintaining appropriate intake of nutrient and fiber, sodium and fats, and appropriate energy expenditure required for the weight goal.;Weight Management: Provide education and appropriate resources to help participant work on and attain dietary goals.;Weight Management/Obesity: Establish reasonable short term and long term weight goals.    Admit Weight 194 lb 7.1 oz (88.2 kg)    Goal Weight: Long Term 160 lb (72.6 kg)   Pt goal   Expected Outcomes Short Term:  Continue to assess and modify interventions until short term weight is achieved;Long Term: Adherence to nutrition and physical activity/exercise program aimed toward attainment of established weight goal;Weight Maintenance: Understanding of the daily nutrition guidelines, which includes 25-35% calories from fat, 7% or less cal from saturated fats, less than '200mg'$  cholesterol, less than 1.5gm of sodium, & 5 or more servings of fruits and vegetables daily;Weight Loss: Understanding of general recommendations for a balanced deficit meal plan, which promotes 1-2 lb weight loss per week and includes a negative energy balance of [phone] kcal/d;Understanding recommendations for meals to include 15-35% energy as protein, 25-35% energy from fat, 35-60% energy from carbohydrates, less than '200mg'$  of dietary cholesterol, 20-35 gm of total fiber daily    Diabetes Yes    Intervention Provide education about signs/symptoms and action to take for hypo/hyperglycemia.;Provide education about proper nutrition, including hydration, and aerobic/resistive exercise prescription along with prescribed medications to achieve blood glucose in normal ranges: Fasting glucose 65-99 mg/dL    Expected Outcomes Short Term: Participant verbalizes understanding of the signs/symptoms and immediate care of hyper/hypoglycemia, proper foot care and importance of medication, aerobic/resistive exercise and nutrition plan for blood glucose control.;Long Term: Attainment of HbA1C < 7%.    Hypertension Yes    Intervention Provide education on lifestyle modifcations including regular physical activity/exercise, weight management, moderate sodium restriction and increased consumption of fresh fruit, vegetables, and low fat dairy, alcohol moderation, and smoking cessation.;Monitor prescription use compliance.    Expected Outcomes Short Term: Continued assessment and intervention until BP is < 140/33m HG in hypertensive participants. < 130/843mHG in  hypertensive participants with diabetes, heart failure or chronic kidney disease.;Long Term: Maintenance of blood pressure at goal levels.    Lipids Yes    Intervention Provide education and support for participant on nutrition & aerobic/resistive exercise along with prescribed medications to achieve LDL '70mg'$ , HDL >'40mg'$ .    Expected Outcomes Short Term: Participant states understanding of desired cholesterol values and is compliant with medications prescribed. Participant is following exercise prescription and nutrition guidelines.;Long Term: Cholesterol controlled with medications as prescribed, with individualized exercise RX and with personalized nutrition plan. Value goals: LDL < '70mg'$ , HDL > 40 mg.    Stress Yes    Intervention Offer individual and/or small group education and counseling on adjustment to heart disease, stress management and health-related lifestyle change. Teach and support self-help strategies.;Refer participants experiencing significant psychosocial distress to appropriate mental health specialists for further evaluation and treatment. When possible, include family members and significant others in education/counseling sessions.    Expected Outcomes Short Term: Participant demonstrates changes in health-related behavior, relaxation and other stress management skills, ability to obtain effective social support, and compliance with psychotropic medications if prescribed.;Long Term: Emotional wellbeing is indicated by absence of clinically significant psychosocial distress or social isolation.             Core Components/Risk Factors/Patient Goals Review:   Goals and Risk Factor Review  [place] Name 04/03/22 1346 04/25/22 1510           Core Components/Risk Factors/Patient Goals Review   Personal Goals Review Weight Management/Obesity;Stress;Hypertension;Lipids;Diabetes Weight Management/Obesity;Stress;Hypertension;Lipids;Diabetes      Review German started intensive  cardiac rehab on 04/03/22. Waunita Schooner did well with exercise, vital signs, oxygen saturations and CBG's were stable Sahir has been doing  well with exercise, vital signs, oxygen saturations and CBG's were stable. Dave's oxygen has been decreased to 1 liter/min per Dr [name] and pulmonary. Oxygen saturations have reamined in the low to mid 90's. Waunita Schooner reports having more energy since participating in the program      Expected Outcomes Saed will continue to participate in intensive cardiac rehab for exercise, nutrition, and lifestyle modifications Jermarcus will continue to participate in intensive cardiac rehab for exercise, nutrition, and lifestyle modifications               Core Components/Risk Factors/Patient Goals at Discharge (Final Review):   Goals and Risk Factor Review - 04/25/22 1510       Core Components/Risk Factors/Patient Goals Review   Personal Goals Review Weight Management/Obesity;Stress;Hypertension;Lipids;Diabetes    Review Sage has been doing  well with exercise, vital signs, oxygen saturations and CBG's were stable. Dave's oxygen has been decreased to 1 liter/min per Dr [name] and pulmonary. Oxygen saturations have reamined in the low to mid 90's. Waunita Schooner reports having more energy since participating in the program    Expected Outcomes Boris will continue to participate in intensive cardiac rehab for exercise, nutrition, and lifestyle modifications             ITP Comments:  ITP Comments     Row Name 03/30/22 1456 04/03/22 1321 04/25/22 1503       ITP Comments Dr Fransico Him MD, Medical Director, Introduction to [facility] Education Program/ Intensive Cardiac Rehab. Initial Orientation Packet Reviewed with the Patient. 30 Day ITP Review. Waunita Schooner started intensive cardiac rehab on 04/03/22 and did well with exercise. 30 Day ITP Review. Waunita Schooner has good attendance and participation in [facility] cardiac rehab. Nai is enjoying the program              Comments: See ITP  comments.Harrell Gave RN BSN

## 2022-04-26 ENCOUNTER — Encounter (HOSPITAL_COMMUNITY): Payer: Medicare Other

## 2022-04-26 DIAGNOSIS — R0981 Nasal congestion: Secondary | ICD-10-CM | POA: Diagnosis not present

## 2022-04-26 DIAGNOSIS — I5032 Chronic diastolic (congestive) heart failure: Secondary | ICD-10-CM | POA: Diagnosis not present

## 2022-04-26 DIAGNOSIS — J9620 Acute and chronic respiratory failure, unspecified whether with hypoxia or hypercapnia: Secondary | ICD-10-CM | POA: Diagnosis not present

## 2022-04-26 DIAGNOSIS — R490 Dysphonia: Secondary | ICD-10-CM | POA: Diagnosis not present

## 2022-04-27 ENCOUNTER — Telehealth (HOSPITAL_COMMUNITY): Payer: Self-pay

## 2022-04-28 ENCOUNTER — Encounter (HOSPITAL_COMMUNITY): Payer: Medicare Other

## 2022-05-01 ENCOUNTER — Telehealth (HOSPITAL_COMMUNITY): Payer: Self-pay | Admitting: *Deleted

## 2022-05-01 ENCOUNTER — Telehealth: Payer: Self-pay | Admitting: Neurology

## 2022-05-01 ENCOUNTER — Encounter (HOSPITAL_COMMUNITY): Payer: Medicare Other

## 2022-05-01 NOTE — Telephone Encounter (Signed)
Spoke with Jeremiah [name] he is feeling better from his cold but is still sneezing. Jeremiah [name] will not attend exercise today as he is still experiencing symptoms. Jeremiah [name] did test negative for COVID 19. Jeremiah [name] plans to return to exercise on Wednesday providing he is symptom free.[name], RN,BSN 05/01/2022 8:50 AM

## 2022-05-01 NOTE — Telephone Encounter (Signed)
[employer] medicare no auth req ref # Lea A on 03/29/22   left VM 04/27/22 [place]

## 2022-05-02 NOTE — Telephone Encounter (Signed)
Patient daughter [name] called back and schedule pt SS.  NPSG- [employer] medicare no auth req ref # [id number] A on 03/29/22   He is scheduled at [facility] for 07/16/22 at 8 pm.  Mailed packet to the patient.

## 2022-05-03 ENCOUNTER — Encounter (HOSPITAL_COMMUNITY)
Admission: RE | Admit: 2022-05-03 | Discharge: 2022-05-03 | Disposition: A | Discharge status: Home / Self Care | Payer: Medicare Other | Source: Ambulatory Visit | Attending: Cardiology | Admitting: Cardiology

## 2022-05-03 DIAGNOSIS — Z955 Presence of coronary angioplasty implant and graft: Secondary | ICD-10-CM

## 2022-05-03 DIAGNOSIS — I5032 Chronic diastolic (congestive) heart failure: Secondary | ICD-10-CM | POA: Diagnosis not present

## 2022-05-03 DIAGNOSIS — I214 Non-ST elevation (NSTEMI) myocardial infarction: Secondary | ICD-10-CM

## 2022-05-03 DIAGNOSIS — I252 Old myocardial infarction: Secondary | ICD-10-CM | POA: Diagnosis not present

## 2022-05-03 DIAGNOSIS — Z48812 Encounter for surgical aftercare following surgery on the circulatory system: Secondary | ICD-10-CM | POA: Diagnosis not present

## 2022-05-05 ENCOUNTER — Encounter (HOSPITAL_COMMUNITY)
Admission: RE | Admit: 2022-05-05 | Discharge: 2022-05-05 | Disposition: A | Discharge status: Home / Self Care | Payer: Medicare Other | Source: Ambulatory Visit | Attending: Cardiology | Admitting: Cardiology

## 2022-05-05 DIAGNOSIS — Z955 Presence of coronary angioplasty implant and graft: Secondary | ICD-10-CM

## 2022-05-05 DIAGNOSIS — I214 Non-ST elevation (NSTEMI) myocardial infarction: Secondary | ICD-10-CM

## 2022-05-05 DIAGNOSIS — I5032 Chronic diastolic (congestive) heart failure: Secondary | ICD-10-CM | POA: Diagnosis not present

## 2022-05-05 DIAGNOSIS — Z48812 Encounter for surgical aftercare following surgery on the circulatory system: Secondary | ICD-10-CM | POA: Diagnosis not present

## 2022-05-05 DIAGNOSIS — I252 Old myocardial infarction: Secondary | ICD-10-CM | POA: Diagnosis not present

## 2022-05-08 ENCOUNTER — Encounter (HOSPITAL_COMMUNITY)
Admission: RE | Admit: 2022-05-08 | Discharge: 2022-05-08 | Disposition: A | Discharge status: Home / Self Care | Payer: Medicare Other | Source: Ambulatory Visit | Attending: Cardiology | Admitting: Cardiology

## 2022-05-08 DIAGNOSIS — Z955 Presence of coronary angioplasty implant and graft: Secondary | ICD-10-CM

## 2022-05-08 DIAGNOSIS — I214 Non-ST elevation (NSTEMI) myocardial infarction: Secondary | ICD-10-CM

## 2022-05-08 DIAGNOSIS — Z48812 Encounter for surgical aftercare following surgery on the circulatory system: Secondary | ICD-10-CM | POA: Diagnosis not present

## 2022-05-08 DIAGNOSIS — I5032 Chronic diastolic (congestive) heart failure: Secondary | ICD-10-CM | POA: Diagnosis not present

## 2022-05-08 DIAGNOSIS — I252 Old myocardial infarction: Secondary | ICD-10-CM | POA: Diagnosis not present

## 2022-05-10 ENCOUNTER — Encounter (HOSPITAL_COMMUNITY)
Admission: RE | Admit: 2022-05-10 | Discharge: 2022-05-10 | Disposition: A | Discharge status: Home / Self Care | Payer: Medicare Other | Source: Ambulatory Visit | Attending: Cardiology | Admitting: Cardiology

## 2022-05-10 DIAGNOSIS — I214 Non-ST elevation (NSTEMI) myocardial infarction: Secondary | ICD-10-CM

## 2022-05-10 DIAGNOSIS — Z955 Presence of coronary angioplasty implant and graft: Secondary | ICD-10-CM

## 2022-05-10 DIAGNOSIS — I252 Old myocardial infarction: Secondary | ICD-10-CM | POA: Diagnosis not present

## 2022-05-10 DIAGNOSIS — Z48812 Encounter for surgical aftercare following surgery on the circulatory system: Secondary | ICD-10-CM | POA: Diagnosis not present

## 2022-05-10 DIAGNOSIS — I5032 Chronic diastolic (congestive) heart failure: Secondary | ICD-10-CM | POA: Diagnosis not present

## 2022-05-12 ENCOUNTER — Encounter (HOSPITAL_COMMUNITY): Payer: Medicare Other

## 2022-05-13 DIAGNOSIS — J9601 Acute respiratory failure with hypoxia: Secondary | ICD-10-CM | POA: Diagnosis not present

## 2022-05-17 ENCOUNTER — Encounter (HOSPITAL_COMMUNITY)
Admission: RE | Admit: 2022-05-17 | Discharge: 2022-05-17 | Disposition: A | Discharge status: Home / Self Care | Payer: Medicare Other | Source: Ambulatory Visit | Attending: Cardiology | Admitting: Cardiology

## 2022-05-17 DIAGNOSIS — Z955 Presence of coronary angioplasty implant and graft: Secondary | ICD-10-CM

## 2022-05-17 DIAGNOSIS — I252 Old myocardial infarction: Secondary | ICD-10-CM | POA: Diagnosis not present

## 2022-05-17 DIAGNOSIS — I214 Non-ST elevation (NSTEMI) myocardial infarction: Secondary | ICD-10-CM

## 2022-05-17 DIAGNOSIS — Z48812 Encounter for surgical aftercare following surgery on the circulatory system: Secondary | ICD-10-CM | POA: Diagnosis not present

## 2022-05-17 DIAGNOSIS — I5032 Chronic diastolic (congestive) heart failure: Secondary | ICD-10-CM | POA: Diagnosis not present

## 2022-05-19 ENCOUNTER — Encounter (HOSPITAL_COMMUNITY): Payer: Medicare Other

## 2022-05-23 NOTE — Progress Notes (Signed)
Cardiac Individual Treatment Plan  Patient Details  Name: Jeremiah [name] MRN: [id number] Date of Birth: 04-Apr-1941 Referring Provider:   Flowsheet Row INTENSIVE CARDIAC REHAB ORIENT from 03/30/2022 in [facility]  Referring Provider Peter [name], M.D.       Initial Encounter Date:  [place] from 03/30/2022 in [facility]  Date 03/30/22       Visit Diagnosis: 01/24/22 NSTEMI  01/24/22 S/P DES RPDA  Patient's Home Medications on Admission:  Current Outpatient Medications:    acetaminophen (TYLENOL) 500 MG tablet, Take 1 tablet (500 mg total) by mouth every 6 (six) hours as needed for mild pain or moderate pain., Disp: 30 tablet, Rfl: 0   aspirin EC 81 MG tablet, Take 81 mg by mouth daily. , Disp: , Rfl:    carvedilol (COREG) 12.5 MG tablet, Take 1 tablet (12.5 mg total) by mouth 2 (two) times daily., Disp: 180 tablet, Rfl: 1   cetirizine (ZYRTEC) 10 MG tablet, Take 10 mg by mouth daily., Disp: , Rfl:    cholecalciferol (VITAMIN D3) 25 MCG (1000 UNIT) tablet, Take 1,000 Units by mouth daily., Disp: , Rfl:    clopidogrel (PLAVIX) 75 MG tablet, Take 1 tablet (75 mg total) by mouth daily with breakfast., Disp: 90 tablet, Rfl: 2   Cyanocobalamin (B-12 PO), Take by mouth., Disp: , Rfl:    dapagliflozin propanediol (FARXIGA) 10 MG TABS tablet, Take 1 tablet (10 mg total) by mouth daily before breakfast., Disp: 30 tablet, Rfl: 11   ezetimibe (ZETIA) 10 MG tablet, Take 10 mg by mouth daily., Disp: , Rfl:    meclizine (ANTIVERT) 25 MG tablet, Take 1 tablet (25 mg total) by mouth 3 (three) times daily as needed for dizziness., Disp: 30 tablet, Rfl: 0   Melatonin 10 MG TABS, Take 10 mg by mouth at bedtime as needed (sleep)., Disp: , Rfl:    metFORMIN (GLUCOPHAGE-XR) 500 MG 24 hr tablet, Take 500 mg by mouth daily., Disp: , Rfl:    pantoprazole  (PROTONIX) 20 MG tablet, Take 20 mg by mouth daily before breakfast. 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 ALS 04/20/22    Labs: Review Flowsheet  More data exists      Latest Ref Rng & Units 05/28/2021 01/21/2022 01/27/2022 01/31/2022 03/14/2022  Labs for ITP Cardiac and Pulmonary Rehab  Cholestrol 100 - 199 mg/dL - 190  - - 140   LDL (calc) 0 - 99 mg/dL - 97  - - 66   HDL-C >39 mg/dL - 40  - - 44   Trlycerides 0 - 149 mg/dL - 264  - - 181   Hemoglobin A1c 4.8 - 5.6 % 7.2  7.9  - - -  PH, Arterial 7.35 - 7.45 - - - 7.37  -  PCO2 arterial 32 - 48 mmHg - - - 66  -  Bicarbonate 20.0 - 28.0 mmol/L - - 28.8  38.2  -  TCO2 22 - 32 mmol/L - - 30  - -  O2 Saturation % - - 96  97.8  -    Capillary Blood Glucose: Lab Results  Component Value Date   GLUCAP 174 (H) 04/24/2022   GLUCAP 157 (H)  04/10/2022   GLUCAP 167 (H) 04/07/2022   GLUCAP 183 (H) 04/05/2022   GLUCAP 168 (H) 04/03/2022     Exercise Target Goals: Exercise Program Goal: Individual exercise prescription set using results from initial 6 min walk test and THRR while considering  patient's activity barriers and safety.   Exercise Prescription Goal: Initial exercise prescription builds to 30-45 minutes a day of aerobic activity, 2-3 days per week.  Home exercise guidelines will be given to patient during program as part of exercise prescription that the participant will acknowledge.  Activity Barriers & Risk Stratification:  Activity Barriers & Cardiac Risk Stratification - 03/30/22 1204       Activity Barriers & Cardiac Risk Stratification   Activity Barriers Neck/Spine Problems;Deconditioning;Shortness of Breath;Balance Concerns;History of Falls;Other (comment)    Comments Using concentrator / O2 (2 L/min)    Cardiac Risk Stratification High             6 Minute Walk:  6 Minute Walk     Row Name 03/30/22 1157         6 Minute Walk   Phase Initial  Nustep test performed     Distance 1312 feet  (.40 km equiviant on nustep)     Walk Time 6 minutes     # of Rest Breaks 0     MPH 1.94     METS 2.5     RPE 9     Perceived Dyspnea  0     VO2 Peak 6.81     Symptoms No     Resting HR 76 bpm     Resting BP 114/70     Resting Oxygen Saturation  97 %     Exercise Oxygen Saturation  during 6 min walk 98 %     Max Ex. HR 75 bpm     Max Ex. BP 130/80     2 Minute Post BP 122/78       Interval HR   1 Minute HR 76     2 Minute HR 73     3 Minute HR 73     4 Minute HR 75     5 Minute HR 75     6 Minute HR 74     2 Minute Post HR 70     Interval Heart Rate?  Yes       Interval Oxygen   Interval Oxygen? Yes     Baseline Oxygen Saturation % 97 %     1 Minute Oxygen Saturation % 98 %     1 Minute Liters of Oxygen 2 L     2 Minute Oxygen Saturation % 95 %     2 Minute Liters of Oxygen 2 L     3  Minute Oxygen Saturation % 97 %     3 Minute Liters of Oxygen 2 L     4 Minute Oxygen Saturation % 97 %     4 Minute Liters of Oxygen 2 L     5 Minute Oxygen Saturation % 98 %     5 Minute Liters of Oxygen 2 L     6 Minute Oxygen Saturation % 98 %     6 Minute Liters of Oxygen 2 L     2 Minute Post Oxygen Saturation % 99 %     2 Minute Post Liters of Oxygen 2 L              Oxygen Initial Assessment:   Oxygen Re-Evaluation:   Oxygen Discharge (Final Oxygen Re-Evaluation):   Initial Exercise Prescription:  Initial Exercise Prescription - 03/30/22 1200       Date of Initial Exercise RX and Referring Provider   Date 03/30/22    Referring Provider Peter [name], M.D.    Expected Discharge Date 05/26/22      Oxygen   Oxygen Continuous    Liters 2      NuStep   Level 2    SPM 75    Minutes 15    METs 2.5      Arm Ergometer   Level 1    Watts 25    RPM 60    Minutes 15    METs 2.5      Prescription Details   Frequency (times per week) 3    Duration Progress to 30 minutes of continuous aerobic without signs/symptoms of physical distress      Intensity   THRR 40-80% of Max Heartrate 55-110    Ratings of Perceived Exertion 11-13    Perceived Dyspnea 0-4      Progression   Progression Continue progressive overload as per policy without signs/symptoms or physical distress.      Resistance Training   Training Prescription Yes    Weight 2 lbs    Reps 10-15             Perform Capillary Blood Glucose checks as needed.  Exercise Prescription Changes:   Exercise Prescription Changes     Row Name 04/03/22 1500 04/21/22 1600 05/05/22 1400         Response to Exercise   Blood Pressure (Admit) 126/84 116/80 122/64     Blood Pressure (Exercise) 142/80 122/80 142/82     Blood Pressure (Exit) 110/76 104/72 110/60     Heart Rate (Admit) 74 bpm 75 bpm 72 bpm     Heart Rate (Exercise) 88 bpm 104 bpm 102 bpm     Heart Rate (Exit) 80 bpm 81 bpm 81 bpm      Oxygen Saturation (Admit) 97 % 93 % 95 %     Oxygen Saturation (Exercise) 97 % 92 % 94 %     Oxygen Saturation (Exit) 95 % 92 % 93 %     Rating of Perceived Exertion (Exercise) _0 Perceived  Dyspnea (Exercise) 0 0 0     Symptoms None None None     Comments Pt's first day in the CRP2 program Reviewed METs Reviewed METs and goals     Duration Progress to 30 minutes of  aerobic without signs/symptoms of physical distress Continue with 30 min of aerobic exercise without signs/symptoms of physical distress. Continue with 30 min of aerobic exercise without signs/symptoms of physical distress.     Intensity THRR unchanged THRR unchanged THRR unchanged       Progression   Progression Continue to progress workloads to maintain intensity without signs/symptoms of physical distress. Continue to progress workloads to maintain intensity without signs/symptoms of physical distress. Continue to progress workloads to maintain intensity without signs/symptoms of physical distress.     Average METs 1.7 2.15 2.2       Resistance Training   Training Prescription Yes Yes Yes     Weight 2 lbs 2 lbs 3 lbs     Reps 10-15 10-15 10-15     Time 10 Minutes  10 10 Minutes 10 Minutes       Interval Training   Interval Training -- No No       Oxygen   Oxygen Continuous Continuous Continuous     Liters _0 NuStep   Level _1 SPM 70 102 108     Minutes _2 METs 1.6 2.4 1.9       Arm Ergometer   Level _3 Watts -- 10 13     RPM 37 43 47     Minutes _4 METs 1.8 1.9 2.2       Oxygen   Maintain Oxygen Saturation -- 88% or higher --              Exercise Comments:   Exercise Comments     Row Name 04/03/22 1507 04/21/22 1608 05/05/22 1428 05/19/22 1500 05/19/22 1623   Exercise Comments Pt's first day in the CRP2 program. Pt tolerated the exercise session well without complaints. Reviewed METs. Pt is progressing. Pt had no complaints with today's session.  Reviewed Goals and METs with pt today. Pt now uisng 1 liter of O2 with exercise. Pt due for MET review today but did not attend class. Will complete when patient returns. --    [place] Name 05/23/22 [phone]           Exercise Comments --                Exercise Goals and Review:   Exercise Goals     Row Name 03/30/22 1205             Exercise Goals   Increase Physical Activity Yes       Intervention Provide advice, education, support and counseling about physical activity/exercise needs.;Develop an individualized exercise prescription for aerobic and resistive training based on initial evaluation findings, risk stratification, comorbidities and participant's personal goals.       Expected Outcomes Short Term: Attend rehab on a regular basis to increase amount of physical activity.;Long Term: Add in home exercise to make exercise part of routine and to increase amount of physical activity.;Long Term: Exercising regularly at least 3-5 days a week.       Increase Strength and Stamina Yes       Intervention Provide advice, education,  support and counseling about physical activity/exercise needs.;Develop an individualized exercise prescription for aerobic and resistive training based on initial evaluation findings, risk stratification, comorbidities and participant's personal goals.       Expected Outcomes Short Term: Increase workloads from initial exercise prescription for resistance, speed, and METs.;Short Term: Perform resistance training exercises routinely during rehab and add in resistance training at home;Long Term: Improve cardiorespiratory fitness, muscular endurance and strength as measured by increased METs and functional capacity (6MWT)       Able to understand and use rate of perceived exertion (RPE) scale Yes       Intervention Provide education and explanation on how to use RPE scale       Expected Outcomes Short Term: Able to use RPE daily in rehab to express subjective  intensity level;Long Term:  Able to use RPE to guide intensity level when exercising independently       Knowledge and understanding of Target Heart Rate Range (THRR) Yes       Intervention Provide education and explanation of THRR including how the numbers were predicted and where they are located for reference       Expected Outcomes Short Term: Able to state/look up THRR;Short Term: Able to use daily as guideline for intensity in rehab;Long Term: Able to use THRR to govern intensity when exercising independently       Understanding of Exercise Prescription Yes       Intervention Provide education, explanation, and written materials on patient's individual exercise prescription       Expected Outcomes Short Term: Able to explain program exercise prescription;Long Term: Able to explain home exercise prescription to exercise independently                Exercise Goals Re-Evaluation :  Exercise Goals Re-Evaluation     Row Name 04/03/22 1505 05/05/22 1426           Exercise Goal Re-Evaluation   Exercise Goals Review Increase Physical Activity;Increase Strength and Stamina;Able to understand and use rate of perceived exertion (RPE) scale;Knowledge and understanding of Target Heart Rate Range (THRR);Understanding of Exercise Prescription Increase Physical Activity;Increase Strength and Stamina;Able to understand and use rate of perceived exertion (RPE) scale;Knowledge and understanding of Target Heart Rate Range (THRR);Understanding of Exercise Prescription      Comments Pt's first day in the CRP2 program. Pt understands the exercise Rx, RPE scale, and THRR> Reviewed METs and goals. Pt voices improvement in upperbody strength, which was a goal. Pt also voices improved stamina.      Expected Outcomes Will continue to monitor patients and progress exercise workloads as tolerated. Will continue to monitor patients and progress exercise workloads as tolerated.               Discharge  Exercise Prescription (Final Exercise Prescription Changes):  Exercise Prescription Changes - 05/05/22 1400       Response to Exercise   Blood Pressure (Admit) 122/64    Blood Pressure (Exercise) 142/82    Blood Pressure (Exit) 110/60    Heart Rate (Admit) 72 bpm    Heart Rate (Exercise) 102 bpm    Heart Rate (Exit) 81 bpm    Oxygen Saturation (Admit) 95 %    Oxygen Saturation (Exercise) 94 %    Oxygen Saturation (Exit) 93 %    Rating of Perceived Exertion (Exercise) 12    Perceived Dyspnea (Exercise) 0    Symptoms None    Comments Reviewed METs and goals    Duration  Continue with 30 min of aerobic exercise without signs/symptoms of physical distress.    Intensity THRR unchanged      Progression   Progression Continue to progress workloads to maintain intensity without signs/symptoms of physical distress.    Average METs 2.2      Resistance Training   Training Prescription Yes    Weight 3 lbs    Reps 10-15    Time 10 Minutes      Interval Training   Interval Training No      Oxygen   Oxygen Continuous    Liters 1      NuStep   Level 2    SPM 108    Minutes 15    METs 1.9      Arm Ergometer   Level 2    Watts 13    RPM 47    Minutes 15    METs 2.2             Nutrition:  Target Goals: Understanding of nutrition guidelines, daily intake of sodium <1550m, cholesterol <2054m calories 30% from fat and 7% or less from saturated fats, daily to have 5 or more servings of fruits and vegetables.  Biometrics:  Pre Biometrics - 03/30/22 0930       Pre Biometrics   Waist Circumference 45.5 inches    Hip Circumference 41.25 inches    Waist to Hip Ratio 1.1 %    Triceps Skinfold 10 mm    % Body Fat 29.5 %    Grip Strength 24 kg    Flexibility --   No performed due to back issues   Single Leg Stand 2.43 seconds              Nutrition Therapy Plan and Nutrition Goals:  Nutrition Therapy & Goals - 05/04/22 0815       Nutrition Therapy   Diet Heart  Healthy/Carbohydrate Consistent    Drug/Food Interactions Statins/Certain Fruits      Personal Nutrition Goals   Nutrition Goal Patient to identify strateiges for managing cardiovascular risk by attending the weekly [employer] education and nutrition courses.    Personal Goal #2 Patient to reduce sodium intake to <150035mer day.    Personal Goal #3 Patient to identify food sources and limit daily intake of refined carbohydrates, saturated fat, trans fat, and sodium.    Comments Goals in progress. Jeremiah [name] making an effort to reduce portion sizes at meals. He does continue to eat out often but he is trying to make more nutrient dense choices such as increased vegetables and reduced fried foods. I suspect sodium intake remains >1500m33my due to eating out frequently. 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 He continues to attend the [employer] education series and reports improved knowledge of heart healthy diet. DaviJoannal continue to benefit from adherance to the [employer] eating plan.      Intervention Plan   Intervention Prescribe, educate and counsel regarding individualized specific dietary modifications aiming towards targeted core components such as weight, hypertension, lipid management, diabetes, heart failure and other comorbidities.;Nutrition handout(s) given to patient.    Expected Outcomes Short Term Goal: Understand basic principles of dietary content, such as calories, fat, sodium, cholesterol and nutrients.;Long Term Goal: Adherence to prescribed nutrition plan.             Nutrition Assessments:  MEDIFICTS Score Key: ?  70 Need to make dietary changes  40-70 Heart Healthy Diet ? 40 Therapeutic Level Cholesterol Diet    Picture Your Plate Scores: <50 Unhealthy dietary pattern with much room for improvement. 41-50 Dietary pattern unlikely to meet recommendations  for good health and room for improvement. 51-60 More healthful dietary pattern, with some room for improvement.  >60 Healthy dietary pattern, although there may be some specific behaviors that could be improved.    Nutrition Goals Re-Evaluation:  Nutrition Goals Re-Evaluation     [employer] Name 04/03/22 1316 05/04/22 0815           Goals   Current Weight 192 lb 3.9 oz (87.2 kg) 193 lb 12.6 oz (87.9 kg)      Comment trigylcerides 181, A1c 7.9 No new labs at this time; most recent labs show trigylcerides 181, A1c 7.9      Expected Outcome Jeremiah [name] has medical history of hyperlipidemia, HTN, Congestive heart failure, NSTEMI, DM2, acute respiratory failure. Jeremiah [name] is the primary caretaker for his wife who has dementia. He does very little cooking and reports eating out frequently or choosing many convenience meals. He eats 1-2 meals per day. He is motivated to lose weight. Khyren will benefit from adhereance to the [employer] eating plan to aid with improvement to triglycerides <130m/dL, A1c <7%, and sodium intake <150108mdaily. Goals in progress. DaNivaneports making an effort to reduce portion sizes at meals. He does continue to eat out often but he is trying to make more nutrient dense choices such as increased vegetables and reduced fried foods. I suspect sodium intake remains >150017may due to eating out frequently. 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 He continues to attend the [employer] education series and reports improved knowledge of heart healthy diet. DavGehrigll continue to benefit from adherance to the [employer] eating plan to aid with improvement to triglycerides <149m35m, A1c <7%, and sodium intake <1500mg62mly.               Nutrition Goals Re-Evaluation:  Nutrition Goals Re-Evaluation     [place] 04/03/22 1316 05/04/22 0815           Goals   Current Weight 192 lb 3.9  oz (87.2 kg) 193 lb 12.6 oz (87.9 kg)      Comment trigylcerides 181, A1c 7.9 No new labs at this time; most recent labs show trigylcerides 181, A1c 7.9      Expected Outcome DavidQuinnmedical history of hyperlipidemia, HTN, Congestive heart failure, NSTEMI, DM2, acute respiratory failure. DavidReverehe primary caretaker for his wife who has dementia. He does very little cooking and reports eating out frequently or choosing many convenience meals. He eats 1-2 meals per day. He is motivated to lose weight. Jeremiah [name] benefit from adhereance to the [employer] eating plan to aid with improvement to triglycerides <149mg/40mA1c <7%, and sodium intake <1500mg d37m. Goals in progress. [place] rKamaris making an effort to reduce portion sizes at meals. He does continue to eat out often but he is trying to make more nutrient dense choices such as increased vegetables and reduced fried foods. I suspect sodium intake remains >1500mg/da71me to eating out frequently. 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 He continues to attend the [employer]  education series and reports improved knowledge of heart healthy diet. Sayan will continue to benefit from adherance to the [employer] eating plan to aid with improvement to triglycerides <166m/dL, A1c <7%, and sodium intake <15046mdaily.               Nutrition Goals Discharge (Final Nutrition Goals Re-Evaluation):  Nutrition Goals Re-Evaluation - 05/04/22 0815       Goals   Current Weight 193 lb 12.6 oz (87.9 kg)    Comment No new labs at this time; most recent labs show trigylcerides 181, A1c 7.9    Expected Outcome Goals in progress. DaNorwoodeports making an effort to reduce portion sizes at meals. He does continue to eat out often but he is trying to make more nutrient dense choices such as increased vegetables and reduced fried foods. I suspect sodium intake  remains >150047may due to eating out frequently. 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 He continues to attend the [employer] education series and reports improved knowledge of heart healthy diet. DavLarenll continue to benefit from adherance to the [employer] eating plan to aid with improvement to triglycerides <149m57m, A1c <7%, and sodium intake <1500mg48mly.             Psychosocial: Target Goals: Acknowledge presence or absence of significant depression and/or stress, maximize coping skills, provide positive support system. Participant is able to verbalize types and ability to use techniques and skills needed for reducing stress and depression.  Initial Review & Psychosocial Screening:  Initial Psych Review & Screening - 03/30/22 1502       Initial Review   Current issues with Current Stress Concerns    Source of Stress Concerns Family;Chronic Illness    Comments Dave's wife has demetia.      Family Dynamics   Good Support System? Yes   DavidJosephanthonyhis wife, daughter and caregivers who assist with the patient and his wife's care twice a week     Barriers   Psychosocial barriers to participate in program The patient should benefit from training in stress management and relaxation.      Screening Interventions   Interventions Encouraged to exercise    Expected Outcomes Long Term Goal: Stressors or current issues are controlled or eliminated.             Quality of Life Scores:  Quality of Life - 03/30/22 1150       Quality of Life   Select Quality of Life      Quality of Life Scores   Health/Function Pre 28.4 %    Socioeconomic Pre 30 %    Psych/Spiritual Pre 30 %    Family Pre 30 %    GLOBAL Pre 29.27 %            Scores of 19 and below usually indicate a poorer quality of life in these areas.  A difference of  2-3 points is a clinically meaningful  difference.  A difference of 2-3 points in the total score of the Quality of Life Index has been associated with significant improvement in overall quality of life, self-image, physical symptoms, and general health in studies assessing change in quality of life.  PHQ-9: Review Flowsheet       04/03/2022 07/21/2014 03/10/2014  Depression screen PHQ 2/9  Decreased Interest 0 0 0  Down, Depressed, Hopeless 0  0 0  PHQ - 2 Score 0 0 0   Interpretation of Total Score  Total Score Depression Severity:  1-4 = Minimal depression, 5-9 = Mild depression, 10-14 = Moderate depression, 15-19 = Moderately severe depression, 20-27 = Severe depression   Psychosocial Evaluation and Intervention:   Psychosocial Re-Evaluation:  Psychosocial Re-Evaluation     [place] Name 04/03/22 1322 04/25/22 1507 05/23/22 1148         Psychosocial Re-Evaluation   Current issues with Current Stress Concerns Current Stress Concerns Current Stress Concerns     Comments Jeremiah [name] did not voice any increased concerns or stressors on his first day of exercise. Dave's wife accompanied him to exercise today Jeremiah [name] has not  voice any increased concerns or stressors during exercise. Jeremiah [name] has good support from his wife and family Jeremiah [name] continues not to voice any increased concerns or stressors during exercise.     Expected Outcomes Jeremiah [name] will have controlled or decreased stress upon completion of intensive cardiac rehab Jeremiah [name] will have controlled or decreased stress upon completion of intensive cardiac rehab Jeremiah [name] will have controlled or decreased stress upon completion of intensive cardiac rehab     Interventions Stress management education;Encouraged to attend Cardiac Rehabilitation for the exercise Stress management education;Encouraged to attend Cardiac Rehabilitation for the exercise Stress management education;Encouraged to attend Cardiac Rehabilitation for the exercise     Continue Psychosocial Services  Follow up required by staff Follow  up required by staff Follow up required by staff       Initial Review   Source of Stress Concerns Chronic Illness;Family Chronic Illness;Family Chronic Illness;Family     Comments Will continue to monitor and offer support as needed. Will continue to monitor and offer support as needed. Will continue to monitor and offer support as needed.              Psychosocial Discharge (Final Psychosocial Re-Evaluation):  Psychosocial Re-Evaluation - 05/23/22 1148       Psychosocial Re-Evaluation   Current issues with Current Stress Concerns    Comments Jeremiah [name] continues not to voice any increased concerns or stressors during exercise.    Expected Outcomes Jeremiah [name] will have controlled or decreased stress upon completion of intensive cardiac rehab    Interventions Stress management education;Encouraged to attend Cardiac Rehabilitation for the exercise    Continue Psychosocial Services  Follow up required by staff      Initial Review   Source of Stress Concerns Chronic Illness;Family    Comments Will continue to monitor and offer support as needed.             Vocational Rehabilitation: Provide vocational rehab assistance to qualifying candidates.   Vocational Rehab Evaluation & Intervention:  Vocational Rehab - 03/30/22 1545       Initial Vocational Rehab Evaluation & Intervention   Assessment shows need for Vocational Rehabilitation No   Jeremiah [name] is retired and does not need vocational rehab at this time            Education: Education Goals: Education classes will be provided on a weekly basis, covering required topics. Participant will state understanding/return demonstration of topics presented.    Education     Row Name 04/07/22 1500     Education   Cardiac Education Topics Pritikin   Lexicographer Nutrition   Nutrition Calorie Density   Instruction Review Code 1- Verbalizes Understanding   Class Start Time 3383  Class Stop Time 1430   Class Time Calculation (min) 35 min    Row Name 04/10/22 1500     Education   Cardiac Education Topics [place]   Environmental consultant Exercise   Exercise Workshop Exercise Basics: Building Your Action Plan   Instruction Review Code 1- Verbalizes Understanding   Class Start Time 1404   Class Stop Time 1459   Class Time Calculation (min) 55 min    [place] Name 04/12/22 1500     Education   Cardiac Education Topics Pritikin   Financial trader   Weekly Topic Efficiency Cooking - Meals in a Snap   Instruction Review Code 1- Verbalizes Understanding   Class Start Time 0174   Class Stop Time 1452   Class Time Calculation (min) 57 min    [place] Name 04/17/22 1600     Education   Cardiac Education Topics Pritikin   Charity fundraiser Exercise Physiologist   Select Nutrition   Nutrition Nutrition Action Plan   Instruction Review Code 1- Verbalizes Understanding   Class Start Time 1400   Class Stop Time 1435   Class Time Calculation (min) 35 min    [place] Name 04/19/22 1500     Education   Cardiac Education Topics Pritikin   Financial trader   Weekly Topic Fast and Healthy Breakfasts   Instruction Review Code 1- Verbalizes Understanding   Class Start Time 1400   Class Stop Time 1450   Class Time Calculation (min) 50 min    [place] Name 04/24/22 1500     Education   Cardiac Education Topics Pritikin   Education officer, community --   Select --   Psychosocial --   Instruction Review Code --   Class Start Time --   Class Stop Time --   Class Time Calculation (min) --     Workshops   Dentist Psychosocial   Exercise Workshop Other  From [employer] to Heart:  The Power of a Healthy Outlook   Instruction Review Code 1- Verbalizes  Understanding   Class Start Time 1406   Class Stop Time 1500   Class Time Calculation (min) 54 min    Row Name 05/03/22 1600     Education   Cardiac Education Topics Pritikin   Financial trader   Weekly Topic Fast Evening Meals   Instruction Review Code 1- Verbalizes Understanding   Class Start Time 9449   Class Stop Time 1435   Class Time Calculation (min) 40 min    Row Name 05/05/22 1500     Education   Cardiac Education Topics Pritikin   Charity fundraiser Exercise Physiologist   Select Psychosocial   Psychosocial How Our Thoughts Can Heal Our Hearts   Instruction Review Code 1- Verbalizes Understanding   Class Start Time 1357   Class Stop Time 1440   Class Time Calculation (min) 43 min    Row Name 05/08/22 1500     Education   Cardiac Education Topics Pritikin   Tree surgeon  Select Nutrition   Nutrition Workshop Fueling a Designer, multimedia   Instruction Review Code 1- Verbalizes Understanding   Class Start Time 1300   Class Stop Time 1345   Class Time Calculation (min) 45 min    [place] Name 05/10/22 1500     Education   Cardiac Education Topics Pritikin   Financial trader   Weekly Topic International Cuisine- Spotlight on the [place]   Instruction Review Code 1- Verbalizes Understanding   Class Start Time 1355   Class Stop Time 1435   Class Time Calculation (min) 40 min    Row Name 05/17/22 1600     Education   Cardiac Education Topics Pritikin   Charity fundraiser Exercise Physiologist   Select Nutrition   Nutrition Cooking - Healthy Salads and Dressing   Instruction Review Code 1- Verbalizes Understanding   Class Start Time 1358   Class Stop Time 1442   Class Time Calculation (min) 44 min            Core Videos: Exercise    Move It!  Clinical staff  conducted group or individual video education with verbal and written material and guidebook.  Patient learns the recommended Pritikin exercise program. Exercise with the goal of living a long, healthy life. Some of the health benefits of exercise include controlled diabetes, healthier blood pressure levels, improved cholesterol levels, improved heart and lung capacity, improved sleep, and better body composition. Everyone should speak with their doctor before starting or changing an exercise routine.  Biomechanical Limitations Clinical staff conducted group or individual video education with verbal and written material and guidebook.  Patient learns how biomechanical limitations can impact exercise and how we can mitigate and possibly overcome limitations to have an impactful and balanced exercise routine.  Body Composition Clinical staff conducted group or individual video education with verbal and written material and guidebook.  Patient learns that body composition (ratio of muscle mass to fat mass) is a key component to assessing overall fitness, rather than body weight alone. Increased fat mass, especially visceral belly fat, can put [place] at increased risk for metabolic syndrome, type 2 diabetes, heart disease, and even death. It is recommended to combine diet and exercise (cardiovascular and resistance training) to improve your body composition. Seek guidance from your physician and exercise physiologist before implementing an exercise routine.  Exercise Action Plan Clinical staff conducted group or individual video education with verbal and written material and guidebook.  Patient learns the recommended strategies to achieve and enjoy long-term exercise adherence, including variety, self-motivation, self-efficacy, and positive decision making. Benefits of exercise include fitness, good health, weight management, more energy, better sleep, less stress, and overall well-being.  Medical   Heart  Disease Risk Reduction Clinical staff conducted group or individual video education with verbal and written material and guidebook.  Patient learns our heart is our most vital organ as it circulates oxygen, nutrients, white blood cells, and hormones throughout the entire body, and carries waste away. Data supports a plant-based eating plan like the Pritikin Program for its effectiveness in slowing progression of and reversing heart disease. The video provides a number of recommendations to address heart disease.   Metabolic Syndrome and Belly Fat  Clinical staff conducted group or individual video education with verbal and written material and guidebook.  Patient learns what metabolic syndrome is, how it leads to heart disease,  and how one can reverse it and keep it from coming back. You have metabolic syndrome if you have 3 of the following 5 criteria: abdominal obesity, high blood pressure, high triglycerides, low HDL cholesterol, and high blood sugar.  Hypertension and Heart Disease Clinical staff conducted group or individual video education with verbal and written material and guidebook.  Patient learns that high blood pressure, or hypertension, is very common in the [place]. Hypertension is largely due to excessive salt intake, but other important risk factors include being overweight, physical inactivity, drinking too much alcohol, smoking, and not eating enough potassium from fruits and vegetables. High blood pressure is a leading risk factor for heart attack, stroke, congestive heart failure, dementia, kidney failure, and premature death. Long-term effects of excessive salt intake include stiffening of the arteries and thickening of heart muscle and organ damage. Recommendations include ways to reduce hypertension and the risk of heart disease.  Diseases of Our Time - Focusing on Diabetes Clinical staff conducted group or individual video education with verbal and written material and  guidebook.  Patient learns why the best way to stop diseases of our time is prevention, through food and other lifestyle changes. Medicine (such as prescription pills and surgeries) is often only a Band-Aid on the problem, not a long-term solution. Most common diseases of our time include obesity, type 2 diabetes, hypertension, heart disease, and cancer. The Pritikin Program is recommended and has been proven to help reduce, reverse, and/or prevent the damaging effects of metabolic syndrome.  Nutrition   Overview of the Pritikin Eating Plan  Clinical staff conducted group or individual video education with verbal and written material and guidebook.  Patient learns about the [place] for disease risk reduction. The [place] emphasizes a wide variety of unrefined, minimally-processed carbohydrates, like fruits, vegetables, whole grains, and legumes. Go, Caution, and Stop food choices are explained. Plant-based and lean animal proteins are emphasized. Rationale provided for low sodium intake for blood pressure control, low added sugars for blood sugar stabilization, and low added fats and oils for coronary artery disease risk reduction and weight management.  Calorie Density  Clinical staff conducted group or individual video education with verbal and written material and guidebook.  Patient learns about calorie density and how it impacts the Pritikin Eating Plan. Knowing the characteristics of the food you choose will help you decide whether those foods will lead to weight gain or weight loss, and whether you want to consume more or less of them. Weight loss is usually a side effect of the Pritikin Eating Plan because of its focus on low calorie-dense foods.  Label Reading  Clinical staff conducted group or individual video education with verbal and written material and guidebook.  Patient learns about the Pritikin recommended label reading guidelines and corresponding  recommendations regarding calorie density, added sugars, sodium content, and whole grains.  Dining Out - Part 1  Clinical staff conducted group or individual video education with verbal and written material and guidebook.  Patient learns that restaurant meals can be sabotaging because they can be so high in calories, fat, sodium, and/or sugar. Patient learns recommended strategies on how to positively address this and avoid unhealthy pitfalls.  Facts on Fats  Clinical staff conducted group or individual video education with verbal and written material and guidebook.  Patient learns that lifestyle modifications can be just as effective, if not more so, as many medications for lowering your risk of heart disease. A Pritikin lifestyle  can help to reduce your risk of inflammation and atherosclerosis (cholesterol build-up, or plaque, in the artery walls). Lifestyle interventions such as dietary choices and physical activity address the cause of atherosclerosis. A review of the types of fats and their impact on blood cholesterol levels, along with dietary recommendations to reduce fat intake is also included.  Nutrition Action Plan  Clinical staff conducted group or individual video education with verbal and written material and guidebook.  Patient learns how to incorporate Pritikin recommendations into their lifestyle. Recommendations include planning and keeping personal health goals in mind as an important part of their success.  Healthy Mind-Set    Healthy Minds, Bodies, Hearts  Clinical staff conducted group or individual video education with verbal and written material and guidebook.  Patient learns how to identify when they are stressed. Video will discuss the impact of that stress, as well as the many benefits of stress management. Patient will also be introduced to stress management techniques. The way we think, act, and feel has an impact on our hearts.  How Our Thoughts Can Heal Our Hearts   Clinical staff conducted group or individual video education with verbal and written material and guidebook.  Patient learns that negative thoughts can cause depression and anxiety. This can result in negative lifestyle behavior and serious health problems. Cognitive behavioral therapy is an effective method to help control our thoughts in order to change and improve our emotional outlook.  Additional Videos:  Exercise    Improving Performance  Clinical staff conducted group or individual video education with verbal and written material and guidebook.  Patient learns to use a non-linear approach by alternating intensity levels and lengths of time spent exercising to help burn more calories and lose more body fat. Cardiovascular exercise helps improve heart health, metabolism, hormonal balance, blood sugar control, and recovery from fatigue. Resistance training improves strength, endurance, balance, coordination, reaction time, metabolism, and muscle mass. Flexibility exercise improves circulation, posture, and balance. Seek guidance from your physician and exercise physiologist before implementing an exercise routine and learn your capabilities and proper form for all exercise.  Introduction to Yoga  Clinical staff conducted group or individual video education with verbal and written material and guidebook.  Patient learns about yoga, a discipline of the coming together of mind, breath, and body. The benefits of yoga include improved flexibility, improved range of motion, better posture and core strength, increased lung function, weight loss, and positive self-image. Yoga's heart health benefits include lowered blood pressure, healthier heart rate, decreased cholesterol and triglyceride levels, improved immune function, and reduced stress. Seek guidance from your physician and exercise physiologist before implementing an exercise routine and learn your capabilities and proper form for all  exercise.  Medical   Aging: Enhancing Your Quality of Life  Clinical staff conducted group or individual video education with verbal and written material and guidebook.  Patient learns key strategies and recommendations to stay in good physical health and enhance quality of life, such as prevention strategies, having an advocate, securing a [place], and keeping a list of medications and system for tracking them. It also discusses how to avoid risk for bone loss.  Biology of Weight Control  Clinical staff conducted group or individual video education with verbal and written material and guidebook.  Patient learns that weight gain occurs because we consume more calories than we burn (eating more, moving less). Even if your body weight is normal, you may have higher ratios  of fat compared to muscle mass. Too much body fat puts you at increased risk for cardiovascular disease, heart attack, stroke, type 2 diabetes, and obesity-related cancers. In addition to exercise, following the [place] can help reduce your risk.  Decoding Lab Results  Clinical staff conducted group or individual video education with verbal and written material and guidebook.  Patient learns that lab test reflects one measurement whose values change over time and are influenced by many factors, including medication, stress, sleep, exercise, food, hydration, pre-existing medical conditions, and more. It is recommended to use the knowledge from this video to become more involved with your lab results and evaluate your numbers to speak with your doctor.   Diseases of Our Time - Overview  Clinical staff conducted group or individual video education with verbal and written material and guidebook.  Patient learns that according to the CDC, 50% to 70% of chronic diseases (such as obesity, type 2 diabetes, elevated lipids, hypertension, and heart disease) are avoidable through lifestyle  improvements including healthier food choices, listening to satiety cues, and increased physical activity.  Sleep Disorders Clinical staff conducted group or individual video education with verbal and written material and guidebook.  Patient learns how good quality and duration of sleep are important to overall health and well-being. Patient also learns about sleep disorders and how they impact health along with recommendations to address them, including discussing with a physician.  Nutrition  Dining Out - Part 2 Clinical staff conducted group or individual video education with verbal and written material and guidebook.  Patient learns how to plan ahead and communicate in order to maximize their dining experience in a healthy and nutritious manner. Included are recommended food choices based on the type of restaurant the patient is visiting.   Fueling a Best boy conducted group or individual video education with verbal and written material and guidebook.  There is a strong connection between our food choices and our health. Diseases like obesity and type 2 diabetes are very prevalent and are in large-part due to lifestyle choices. The Pritikin Eating Plan provides plenty of food and hunger-curbing satisfaction. It is easy to follow, affordable, and helps reduce health risks.  Menu Workshop  Clinical staff conducted group or individual video education with verbal and written material and guidebook.  Patient learns that restaurant meals can sabotage health goals because they are often packed with calories, fat, sodium, and sugar. Recommendations include strategies to plan ahead and to communicate with the manager, chef, or server to help order a healthier meal.  Planning Your Eating Strategy  Clinical staff conducted group or individual video education with verbal and written material and guidebook.  Patient learns about the [place] and its benefit of reducing the  risk of disease. The [place] does not focus on calories. Instead, it emphasizes high-quality, nutrient-rich foods. By knowing the characteristics of the foods, we choose, we can determine their calorie density and make informed decisions.  Targeting Your Nutrition Priorities  Clinical staff conducted group or individual video education with verbal and written material and guidebook.  Patient learns that lifestyle habits have a tremendous impact on disease risk and progression. This video provides eating and physical activity recommendations based on your personal health goals, such as reducing LDL cholesterol, losing weight, preventing or controlling type 2 diabetes, and reducing high blood pressure.  Vitamins and Minerals  Clinical staff conducted group or individual video education with verbal and written material  and guidebook.  Patient learns different ways to obtain key vitamins and minerals, including through a recommended healthy diet. It is important to discuss all supplements you take with your doctor.   Healthy Mind-Set    Smoking Cessation  Clinical staff conducted group or individual video education with verbal and written material and guidebook.  Patient learns that cigarette smoking and tobacco addiction pose a serious health risk which affects millions of people. Stopping smoking will significantly reduce the risk of heart disease, lung disease, and many forms of cancer. Recommended strategies for quitting are covered, including working with your doctor to develop a successful plan.  Culinary   Becoming a Financial trader conducted group or individual video education with verbal and written material and guidebook.  Patient learns that cooking at home can be healthy, cost-effective, quick, and puts them in control. Keys to cooking healthy recipes will include looking at your recipe, assessing your equipment needs, planning ahead, making it simple, choosing  cost-effective seasonal ingredients, and limiting the use of added fats, salts, and sugars.  Cooking - Breakfast and Snacks  Clinical staff conducted group or individual video education with verbal and written material and guidebook.  Patient learns how important breakfast is to satiety and nutrition through the entire day. Recommendations include key foods to eat during breakfast to help stabilize blood sugar levels and to prevent overeating at meals later in the day. Planning ahead is also a key component.  Cooking - Human resources officer conducted group or individual video education with verbal and written material and guidebook.  Patient learns eating strategies to improve overall health, including an approach to cook more at home. Recommendations include thinking of animal protein as a side on your plate rather than center stage and focusing instead on lower calorie dense options like vegetables, fruits, whole grains, and plant-based proteins, such as beans. Making sauces in large quantities to freeze for later and leaving the skin on your vegetables are also recommended to maximize your experience.  Cooking - Healthy Salads and Dressing Clinical staff conducted group or individual video education with verbal and written material and guidebook.  Patient learns that vegetables, fruits, whole grains, and legumes are the foundations of the [place]. Recommendations include how to incorporate each of these in flavorful and healthy salads, and how to create homemade salad dressings. Proper handling of ingredients is also covered. Cooking - Soups and [employer] - Soups and Desserts Clinical staff conducted group or individual video education with verbal and written material and guidebook.  Patient learns that Pritikin soups and desserts make for easy, nutritious, and delicious snacks and meal components that are low in sodium, fat, sugar, and calorie density, while high in  vitamins, minerals, and filling fiber. Recommendations include simple and healthy ideas for soups and desserts.   Overview     The Pritikin Solution Program Overview Clinical staff conducted group or individual video education with verbal and written material and guidebook.  Patient learns that the results of the [place] Program have been documented in more than 100 articles published in peer-reviewed journals, and the benefits include reducing risk factors for (and, in some cases, even reversing) high cholesterol, high blood pressure, type 2 diabetes, obesity, and more! An overview of the three key pillars of the Pritikin Program will be covered: eating well, doing regular exercise, and having a healthy mind-set.  WORKSHOPS  Exercise: Exercise Basics: Building Your Action Plan Clinical staff led  group instruction and group discussion with PowerPoint presentation and patient guidebook. To enhance the learning environment the use of posters, models and videos may be added. At the conclusion of this workshop, patients will comprehend the difference between physical activity and exercise, as well as the benefits of incorporating both, into their routine. Patients will understand the FITT (Frequency, Intensity, Time, and Type) principle and how to use it to build an exercise action plan. In addition, safety concerns and other considerations for exercise and cardiac rehab will be addressed by the presenter. The purpose of this lesson is to promote a comprehensive and effective weekly exercise routine in order to improve patients' overall level of fitness.   Managing Heart Disease: Your Path to a Healthier Heart Clinical staff led group instruction and group discussion with PowerPoint presentation and patient guidebook. To enhance the learning environment the use of posters, models and videos may be added.At the conclusion of this workshop, patients will understand the anatomy and physiology of the  heart. Additionally, they will understand how Pritikin's three pillars impact the risk factors, the progression, and the management of heart disease.  The purpose of this lesson is to provide a high-level overview of the heart, heart disease, and how the Pritikin lifestyle positively impacts risk factors.  Exercise Biomechanics Clinical staff led group instruction and group discussion with PowerPoint presentation and patient guidebook. To enhance the learning environment the use of posters, models and videos may be added. Patients will learn how the structural parts of their bodies function and how these functions impact their daily activities, movement, and exercise. Patients will learn how to promote a neutral spine, learn how to manage pain, and identify ways to improve their physical movement in order to promote healthy living. The purpose of this lesson is to expose patients to common physical limitations that impact physical activity. Participants will learn practical ways to adapt and manage aches and pains, and to minimize their effect on regular exercise. Patients will learn how to maintain good posture while sitting, walking, and lifting.  Balance Training and Fall Prevention  Clinical staff led group instruction and group discussion with PowerPoint presentation and patient guidebook. To enhance the learning environment the use of posters, models and videos may be added. At the conclusion of this workshop, patients will understand the importance of their sensorimotor skills (vision, proprioception, and the vestibular system) in maintaining their ability to balance as they age. Patients will apply a variety of balancing exercises that are appropriate for their current level of function. Patients will understand the common causes for poor balance, possible solutions to these problems, and ways to modify their physical environment in order to minimize their fall risk. The purpose of this  lesson is to teach patients about the importance of maintaining balance as they age and ways to minimize their risk of falling.  WORKSHOPS   Nutrition:  Fueling a Scientist, research (physical sciences) led group instruction and group discussion with PowerPoint presentation and patient guidebook. To enhance the learning environment the use of posters, models and videos may be added. Patients will review the foundational principles of the [place] and understand what constitutes a serving size in each of the food groups. Patients will also learn Pritikin-friendly foods that are better choices when away from home and review make-ahead meal and snack options. Calorie density will be reviewed and applied to three nutrition priorities: weight maintenance, weight loss, and weight gain. The purpose of this lesson is to reinforce (in  a group setting) the key concepts around what patients are recommended to eat and how to apply these guidelines when away from home by planning and selecting Pritikin-friendly options. Patients will understand how calorie density may be adjusted for different weight management goals.  Mindful Eating  Clinical staff led group instruction and group discussion with PowerPoint presentation and patient guidebook. To enhance the learning environment the use of posters, models and videos may be added. Patients will briefly review the concepts of the [place] and the importance of low-calorie dense foods. The concept of mindful eating will be introduced as well as the importance of paying attention to internal hunger signals. Triggers for non-hunger eating and techniques for dealing with triggers will be explored. The purpose of this lesson is to provide patients with the opportunity to review the basic principles of the [place], discuss the value of eating mindfully and how to measure internal cues of hunger and fullness using the Hunger Scale. Patients will also  discuss reasons for non-hunger eating and learn strategies to use for controlling emotional eating.  Targeting Your Nutrition Priorities Clinical staff led group instruction and group discussion with PowerPoint presentation and patient guidebook. To enhance the learning environment the use of posters, models and videos may be added. Patients will learn how to determine their genetic susceptibility to disease by reviewing their family history. Patients will gain insight into the importance of diet as part of an overall healthy lifestyle in mitigating the impact of genetics and other environmental insults. The purpose of this lesson is to provide patients with the opportunity to assess their personal nutrition priorities by looking at their family history, their own health history and current risk factors. Patients will also be able to discuss ways of prioritizing and modifying the [place] for their highest risk areas  Menu  Clinical staff led group instruction and group discussion with PowerPoint presentation and patient guidebook. To enhance the learning environment the use of posters, models and videos may be added. Using menus brought in from [employer], or printed from [employer], patients will apply the [place] dining out guidelines that were presented in the [employer] video. Patients will also be able to practice these guidelines in a variety of provided scenarios. The purpose of this lesson is to provide patients with the opportunity to practice hands-on learning of the [place] with actual menus and practice scenarios.  Label Reading Clinical staff led group instruction and group discussion with PowerPoint presentation and patient guidebook. To enhance the learning environment the use of posters, models and videos may be added. Patients will review and discuss the Pritikin label reading guidelines presented in Pritikin's Label Reading  Educational series video. Using fool labels brought in from local grocery stores and markets, patients will apply the label reading guidelines and determine if the packaged food meet the Pritikin guidelines. The purpose of this lesson is to provide patients with the opportunity to review, discuss, and practice hands-on learning of the Pritikin Label Reading guidelines with actual packaged food labels. [place] Workshops are designed to teach patients ways to prepare quick, simple, and affordable recipes at home. The importance of nutrition's role in chronic disease risk reduction is reflected in its emphasis in the overall Pritikin program. By learning how to prepare essential core Pritikin Eating Plan recipes, patients will increase control over what they eat; be able to customize the flavor of foods without  the use of added salt, sugar, or fat; and improve the quality of the food they consume. By learning a set of core recipes which are easily assembled, quickly prepared, and affordable, patients are more likely to prepare more healthy foods at home. These workshops focus on convenient breakfasts, simple entres, side dishes, and desserts which can be prepared with minimal effort and are consistent with nutrition recommendations for cardiovascular risk reduction. Cooking [employer] are taught by a Engineer, materials (RD) who has been trained by the [employer]. The chef or RD has a clear understanding of the importance of minimizing - if not completely eliminating - added fat, sugar, and sodium in recipes. Throughout the series of [place] Workshop sessions, patients will learn about healthy ingredients and efficient methods of cooking to build confidence in their capability to prepare    Cooking School weekly topics:  Adding Flavor- Sodium-Free  Fast and Healthy Breakfasts  Powerhouse Plant-Based Proteins  Satisfying Salads and  Dressings  Simple Sides and Sauces  International Cuisine-Spotlight on the [place] Zones  Delicious Desserts  Savory Soups  Efficiency Cooking - Meals in a Snap  Tasty Appetizers and Snacks  Comforting Weekend Breakfasts  One-Pot Wonders   Fast Evening Meals  Easy [place] (Psychosocial): New Thoughts, New Behaviors Clinical staff led group instruction and group discussion with PowerPoint presentation and patient guidebook. To enhance the learning environment the use of posters, models and videos may be added. Patients will learn and practice techniques for developing effective health and lifestyle goals. Patients will be able to effectively apply the goal setting process learned to develop at least one new personal goal.  The purpose of this lesson is to expose patients to a new skill set of behavior modification techniques such as techniques setting SMART goals, overcoming barriers, and achieving new thoughts and new behaviors.  Managing Moods and Relationships Clinical staff led group instruction and group discussion with PowerPoint presentation and patient guidebook. To enhance the learning environment the use of posters, models and videos may be added. Patients will learn how emotional and chronic stress factors can impact their health and relationships. They will learn healthy ways to manage their moods and utilize positive coping mechanisms. In addition, ICR patients will learn ways to improve communication skills. The purpose of this lesson is to expose patients to ways of understanding how one's mood and health are intimately connected. Developing a healthy outlook can help build positive relationships and connections with others. Patients will understand the importance of utilizing effective communication skills that include actively listening and being heard. They will learn and understand the importance of the "4 Cs"  and especially Connections in fostering of a Healthy Mind-Set.  Healthy Sleep for a Healthy Heart Clinical staff led group instruction and group discussion with PowerPoint presentation and patient guidebook. To enhance the learning environment the use of posters, models and videos may be added. At the conclusion of this workshop, patients will be able to demonstrate knowledge of the importance of sleep to overall health, well-being, and quality of life. They will understand the symptoms of, and treatments for, common sleep disorders. Patients will also be able to identify daytime and nighttime behaviors which impact sleep, and they will be able to apply these tools to help manage sleep-related challenges. The purpose of this lesson is to provide patients with a general overview of sleep and outline the importance of quality  sleep. Patients will learn about a few of the most common sleep disorders. Patients will also be introduced to the concept of "sleep hygiene," and discover ways to self-manage certain sleeping problems through simple daily behavior changes. Finally, the workshop will motivate patients by clarifying the links between quality sleep and their goals of heart-healthy living.   Recognizing and Reducing Stress Clinical staff led group instruction and group discussion with PowerPoint presentation and patient guidebook. To enhance the learning environment the use of posters, models and videos may be added. At the conclusion of this workshop, patients will be able to understand the types of stress reactions, differentiate between acute and chronic stress, and recognize the impact that chronic stress has on their health. They will also be able to apply different coping mechanisms, such as reframing negative self-talk. Patients will have the opportunity to practice a variety of stress management techniques, such as deep abdominal breathing, progressive muscle relaxation, and/or guided imagery.  The  purpose of this lesson is to educate patients on the role of stress in their lives and to provide healthy techniques for coping with it.  Learning Barriers/Preferences:  Learning Barriers/Preferences - 03/30/22 1152       Learning Barriers/Preferences   Learning Barriers Sight;Hearing    Learning Preferences Audio;Verbal Instruction;Computer/Internet;Video;Group Instruction;Written Material;Individual Instruction;Skilled Demonstration;Pictoral             Education Topics:  Knowledge Questionnaire Score:  Knowledge Questionnaire Score - 03/30/22 1153       Knowledge Questionnaire Score   Pre Score 21/24             Core Components/Risk Factors/Patient Goals at Admission:  Personal Goals and Risk Factors at Admission - 03/30/22 1154       Core Components/Risk Factors/Patient Goals on Admission    Weight Management Yes;Weight Loss    Intervention Weight Management: Develop a combined nutrition and exercise program designed to reach desired caloric intake, while maintaining appropriate intake of nutrient and fiber, sodium and fats, and appropriate energy expenditure required for the weight goal.;Weight Management: Provide education and appropriate resources to help participant work on and attain dietary goals.;Weight Management/Obesity: Establish reasonable short term and long term weight goals.    Admit Weight 194 lb 7.1 oz (88.2 kg)    Goal Weight: Long Term 160 lb (72.6 kg)   Pt goal   Expected Outcomes Short Term: Continue to assess and modify interventions until short term weight is achieved;Long Term: Adherence to nutrition and physical activity/exercise program aimed toward attainment of established weight goal;Weight Maintenance: Understanding of the daily nutrition guidelines, which includes 25-35% calories from fat, 7% or less cal from saturated fats, less than 275m cholesterol, less than 1.5gm of sodium, & 5 or more servings of fruits and vegetables daily;Weight Loss:  Understanding of general recommendations for a balanced deficit meal plan, which promotes 1-2 lb weight loss per week and includes a negative energy balance of [id number] kcal/d;Understanding recommendations for meals to include 15-35% energy as protein, 25-35% energy from fat, 35-60% energy from carbohydrates, less than 2071mof dietary cholesterol, 20-35 gm of total fiber daily    Diabetes Yes    Intervention Provide education about signs/symptoms and action to take for hypo/hyperglycemia.;Provide education about proper nutrition, including hydration, and aerobic/resistive exercise prescription along with prescribed medications to achieve blood glucose in normal ranges: Fasting glucose 65-99 mg/dL    Expected Outcomes Short Term: Participant verbalizes understanding of the signs/symptoms and immediate care of hyper/hypoglycemia, proper foot care and importance of  medication, aerobic/resistive exercise and nutrition plan for blood glucose control.;Long Term: Attainment of HbA1C < 7%.    Hypertension Yes    Intervention Provide education on lifestyle modifcations including regular physical activity/exercise, weight management, moderate sodium restriction and increased consumption of fresh fruit, vegetables, and low fat dairy, alcohol moderation, and smoking cessation.;Monitor prescription use compliance.    Expected Outcomes Short Term: Continued assessment and intervention until BP is < 140/59m HG in hypertensive participants. < 130/864mHG in hypertensive participants with diabetes, heart failure or chronic kidney disease.;Long Term: Maintenance of blood pressure at goal levels.    Lipids Yes    Intervention Provide education and support for participant on nutrition & aerobic/resistive exercise along with prescribed medications to achieve LDL <7088mHDL >8m34m  Expected Outcomes Short Term: Participant states understanding of desired cholesterol values and is compliant with medications prescribed.  Participant is following exercise prescription and nutrition guidelines.;Long Term: Cholesterol controlled with medications as prescribed, with individualized exercise RX and with personalized nutrition plan. Value goals: LDL < 70mg39mL > 40 mg.    Stress Yes    Intervention Offer individual and/or small group education and counseling on adjustment to heart disease, stress management and health-related lifestyle change. Teach and support self-help strategies.;Refer participants experiencing significant psychosocial distress to appropriate mental health specialists for further evaluation and treatment. When possible, include family members and significant others in education/counseling sessions.    Expected Outcomes Short Term: Participant demonstrates changes in health-related behavior, relaxation and other stress management skills, ability to obtain effective social support, and compliance with psychotropic medications if prescribed.;Long Term: Emotional wellbeing is indicated by absence of clinically significant psychosocial distress or social isolation.             Core Components/Risk Factors/Patient Goals Review:   Goals and Risk Factor Review     Row Name 04/03/22 1346 04/25/22 1510 05/23/22 1153         Core Components/Risk Factors/Patient Goals Review   Personal Goals Review Weight Management/Obesity;Stress;Hypertension;Lipids;Diabetes Weight Management/Obesity;Stress;Hypertension;Lipids;Diabetes Weight Management/Obesity;Stress;Hypertension;Lipids;Diabetes     Review DavidTaniated intensive cardiac rehab on 04/03/22. Dave Jeremiah Schoonerwell with exercise, vital signs, oxygen saturations and CBG's were stable DavidAkivabeen doing  well with exercise, vital signs, oxygen saturations and CBG's were stable. Dave's oxygen has been decreased to 1 liter/min per Dr JordaMartiniquepulmonary. Oxygen saturations have reamined in the low to mid 90's. Dave Jeremiah Schoonerrts having more energy since participating in the  program DavidAlishainues to do  well with exercise, vital signs, oxygen saturations and CBG's have been stable. Dave's oxygen has been decreased to 1 liter/min per Dr JordaMartiniquepulmonary. Oxygen saturations have reamined in the low to mid 90's. Dave Jeremiah [name] participating in intensive cardiac rehab has been helpful for him. Dave Jeremiah [name] complete intensive cardiac rehab on 05/26/22.     Expected Outcomes DavidNorfleet continue to participate in intensive cardiac rehab for exercise, nutrition, and lifestyle modifications DavidJahquez continue to participate in intensive cardiac rehab for exercise, nutrition, and lifestyle modifications DavidTyrice continue to participate in intensive cardiac rehab for exercise, nutrition, and lifestyle modifications              Core Components/Risk Factors/Patient Goals at Discharge (Final Review):   Goals and Risk Factor Review - 05/23/22 1153       Core Components/Risk Factors/Patient Goals Review   Personal Goals Review Weight Management/Obesity;Stress;Hypertension;Lipids;Diabetes    Review DavidCaelininues to do  well with exercise, vital signs, oxygen  saturations and CBG's have been stable. Dave's oxygen has been decreased to 1 liter/min per Dr [name] and pulmonary. Oxygen saturations have reamined in the low to mid 90's. Jeremiah [name] says participating in intensive cardiac rehab has been helpful for him. Jeremiah [name] will complete intensive cardiac rehab on 05/26/22.    Expected Outcomes Leah will continue to participate in intensive cardiac rehab for exercise, nutrition, and lifestyle modifications             ITP Comments:  ITP Comments     Row Name 03/30/22 1456 04/03/22 1321 04/25/22 1503 05/23/22 1149     ITP Comments Dr Fransico Him MD, Medical Director, Introduction to Pritikin Education Program/ Intensive Cardiac Rehab. Initial Orientation Packet Reviewed with the Patient. 30 Day ITP Review. Jeremiah [name] started intensive cardiac rehab on 04/03/22 and did well with exercise. 30  Day ITP Review. Jeremiah [name] has good attendance and participation in [facility] cardiac rehab. Tuan is enjoying the program 30 Day ITP Review. Jeremiah [name] continues to have good  participation in intesive cardiac rehab when in attendance. Ranferi is enjoying the program. Jeremiah [name] will complete intensive cardiac rehab on 05/26/22             Comments: See ITP Comments

## 2022-05-24 ENCOUNTER — Encounter (HOSPITAL_COMMUNITY): Payer: Medicare Other

## 2022-05-26 ENCOUNTER — Encounter (HOSPITAL_COMMUNITY)
Admission: RE | Admit: 2022-05-26 | Discharge: 2022-05-26 | Disposition: A | Discharge status: Home / Self Care | Payer: Medicare Other | Source: Ambulatory Visit | Attending: Cardiology | Admitting: Cardiology

## 2022-05-26 DIAGNOSIS — I214 Non-ST elevation (NSTEMI) myocardial infarction: Secondary | ICD-10-CM | POA: Insufficient documentation

## 2022-05-26 DIAGNOSIS — Z955 Presence of coronary angioplasty implant and graft: Secondary | ICD-10-CM | POA: Diagnosis not present

## 2022-05-29 ENCOUNTER — Encounter (HOSPITAL_COMMUNITY)
Admission: RE | Admit: 2022-05-29 | Discharge: 2022-05-29 | Disposition: A | Discharge status: Home / Self Care | Payer: Medicare Other | Source: Ambulatory Visit | Attending: Cardiology | Admitting: Cardiology

## 2022-05-29 DIAGNOSIS — I214 Non-ST elevation (NSTEMI) myocardial infarction: Secondary | ICD-10-CM

## 2022-05-29 DIAGNOSIS — Z955 Presence of coronary angioplasty implant and graft: Secondary | ICD-10-CM | POA: Diagnosis not present

## 2022-05-30 DIAGNOSIS — E1151 Type 2 diabetes mellitus with diabetic peripheral angiopathy without gangrene: Secondary | ICD-10-CM | POA: Diagnosis not present

## 2022-05-30 DIAGNOSIS — Z125 Encounter for screening for malignant neoplasm of prostate: Secondary | ICD-10-CM | POA: Diagnosis not present

## 2022-05-30 DIAGNOSIS — E785 Hyperlipidemia, unspecified: Secondary | ICD-10-CM | POA: Diagnosis not present

## 2022-05-30 DIAGNOSIS — R82998 Other abnormal findings in urine: Secondary | ICD-10-CM | POA: Diagnosis not present

## 2022-05-30 DIAGNOSIS — E538 Deficiency of other specified B group vitamins: Secondary | ICD-10-CM | POA: Diagnosis not present

## 2022-05-30 DIAGNOSIS — I10 Essential (primary) hypertension: Secondary | ICD-10-CM | POA: Diagnosis not present

## 2022-05-31 ENCOUNTER — Encounter (HOSPITAL_COMMUNITY)
Admission: RE | Admit: 2022-05-31 | Discharge: 2022-05-31 | Disposition: A | Discharge status: Home / Self Care | Payer: Medicare Other | Source: Ambulatory Visit | Attending: Cardiology | Admitting: Cardiology

## 2022-05-31 DIAGNOSIS — I214 Non-ST elevation (NSTEMI) myocardial infarction: Secondary | ICD-10-CM | POA: Diagnosis not present

## 2022-05-31 DIAGNOSIS — Z955 Presence of coronary angioplasty implant and graft: Secondary | ICD-10-CM | POA: Diagnosis not present

## 2022-05-31 NOTE — Progress Notes (Signed)
Reviewed home exercise Rx with patient today.  Encouraged warm-up, cool-down, and stretching. Reviewed THRR of  55 - 110 and keeping RPE between 11-13. Encouraged to hydrate with activity.  Reviewed weather parameters for temperature and humidity for safe exercise outdoors. Reviewed S/S to terminate exercise and when to call 911 vs MD. Pt encouraged to always carry a cell phone for safety when exercising outdoors. Pt verbalized understanding of the home exercise Rx and was provided a copy.  Lesly Rubenstein MS, ACSM-CEP, CCRP

## 2022-06-01 ENCOUNTER — Ambulatory Visit: Payer: Medicare Other | Admitting: Cardiology

## 2022-06-01 DIAGNOSIS — C61 Malignant neoplasm of prostate: Secondary | ICD-10-CM | POA: Diagnosis not present

## 2022-06-01 DIAGNOSIS — Z0000 Encounter for general adult medical examination without abnormal findings: Secondary | ICD-10-CM | POA: Diagnosis not present

## 2022-06-01 DIAGNOSIS — Z23 Encounter for immunization: Secondary | ICD-10-CM | POA: Diagnosis not present

## 2022-06-02 ENCOUNTER — Encounter (HOSPITAL_COMMUNITY)
Admission: RE | Admit: 2022-06-02 | Discharge: 2022-06-02 | Disposition: A | Discharge status: Home / Self Care | Payer: Medicare Other | Source: Ambulatory Visit | Attending: Cardiology | Admitting: Cardiology

## 2022-06-02 ENCOUNTER — Ambulatory Visit (HOSPITAL_COMMUNITY): Payer: Medicare Other

## 2022-06-02 VITALS — Ht 68.5 in | Wt 196.2 lb

## 2022-06-02 DIAGNOSIS — Z955 Presence of coronary angioplasty implant and graft: Secondary | ICD-10-CM

## 2022-06-02 DIAGNOSIS — I214 Non-ST elevation (NSTEMI) myocardial infarction: Secondary | ICD-10-CM | POA: Diagnosis not present

## 2022-06-02 LAB — GLUCOSE, CAPILLARY: Glucose-Capillary: 196 mg/dL — ABNORMAL HIGH (ref 70–99)

## 2022-06-05 ENCOUNTER — Encounter (HOSPITAL_COMMUNITY)
Admission: RE | Admit: 2022-06-05 | Discharge: 2022-06-05 | Disposition: A | Discharge status: Home / Self Care | Payer: Medicare Other | Source: Ambulatory Visit | Attending: Cardiology | Admitting: Cardiology

## 2022-06-05 ENCOUNTER — Ambulatory Visit (HOSPITAL_COMMUNITY): Payer: Medicare Other

## 2022-06-05 DIAGNOSIS — I214 Non-ST elevation (NSTEMI) myocardial infarction: Secondary | ICD-10-CM

## 2022-06-05 DIAGNOSIS — Z955 Presence of coronary angioplasty implant and graft: Secondary | ICD-10-CM | POA: Diagnosis not present

## 2022-06-05 LAB — GLUCOSE, CAPILLARY: Glucose-Capillary: 185 mg/dL — ABNORMAL HIGH (ref 70–99)

## 2022-06-07 ENCOUNTER — Encounter (HOSPITAL_COMMUNITY)
Admission: RE | Admit: 2022-06-07 | Discharge: 2022-06-07 | Disposition: A | Discharge status: Home / Self Care | Payer: Medicare Other | Source: Ambulatory Visit | Attending: Cardiology | Admitting: Cardiology

## 2022-06-07 ENCOUNTER — Ambulatory Visit (HOSPITAL_COMMUNITY): Payer: Medicare Other

## 2022-06-07 DIAGNOSIS — I214 Non-ST elevation (NSTEMI) myocardial infarction: Secondary | ICD-10-CM | POA: Diagnosis not present

## 2022-06-07 DIAGNOSIS — Z955 Presence of coronary angioplasty implant and graft: Secondary | ICD-10-CM | POA: Diagnosis not present

## 2022-06-09 ENCOUNTER — Encounter (HOSPITAL_COMMUNITY)
Admission: RE | Admit: 2022-06-09 | Discharge: 2022-06-09 | Disposition: A | Discharge status: Home / Self Care | Payer: Medicare Other | Source: Ambulatory Visit | Attending: Cardiology | Admitting: Cardiology

## 2022-06-09 ENCOUNTER — Ambulatory Visit (HOSPITAL_COMMUNITY): Payer: Medicare Other

## 2022-06-09 DIAGNOSIS — Z955 Presence of coronary angioplasty implant and graft: Secondary | ICD-10-CM | POA: Diagnosis not present

## 2022-06-09 DIAGNOSIS — I214 Non-ST elevation (NSTEMI) myocardial infarction: Secondary | ICD-10-CM | POA: Diagnosis not present

## 2022-06-10 DIAGNOSIS — J9601 Acute respiratory failure with hypoxia: Secondary | ICD-10-CM | POA: Diagnosis not present

## 2022-06-12 ENCOUNTER — Encounter (HOSPITAL_COMMUNITY)
Admission: RE | Admit: 2022-06-12 | Discharge: 2022-06-12 | Disposition: A | Discharge status: Home / Self Care | Payer: Medicare Other | Source: Ambulatory Visit | Attending: Cardiology | Admitting: Cardiology

## 2022-06-12 ENCOUNTER — Ambulatory Visit: Payer: Self-pay

## 2022-06-12 ENCOUNTER — Ambulatory Visit (HOSPITAL_COMMUNITY): Payer: Medicare Other

## 2022-06-12 DIAGNOSIS — Z955 Presence of coronary angioplasty implant and graft: Secondary | ICD-10-CM | POA: Diagnosis not present

## 2022-06-12 DIAGNOSIS — I214 Non-ST elevation (NSTEMI) myocardial infarction: Secondary | ICD-10-CM | POA: Diagnosis not present

## 2022-06-12 NOTE — Patient Outreach (Signed)
  Care Coordination   06/12/2022 Name: Jeremiah [name] MRN: [id number] DOB: Oct 16, 1940   Care Coordination Outreach Attempts:  An unsuccessful telephone outreach was attempted for a scheduled appointment today.  Follow Up Plan:  Additional outreach attempts will be made to offer the patient care coordination information and services.   Encounter Outcome:  No Answer   Care Coordination Interventions:  No, not indicated    Barb Merino, RN, BSN, CCM Care Management Coordinator [facility] Care Management  Direct Phone: [phone]

## 2022-06-13 DIAGNOSIS — J9601 Acute respiratory failure with hypoxia: Secondary | ICD-10-CM | POA: Diagnosis not present

## 2022-06-14 ENCOUNTER — Ambulatory Visit (HOSPITAL_COMMUNITY): Payer: Medicare Other

## 2022-06-14 ENCOUNTER — Encounter (HOSPITAL_COMMUNITY)
Admission: RE | Admit: 2022-06-14 | Discharge: 2022-06-14 | Disposition: A | Discharge status: Home / Self Care | Payer: Medicare Other | Source: Ambulatory Visit | Attending: Cardiology | Admitting: Cardiology

## 2022-06-14 DIAGNOSIS — I214 Non-ST elevation (NSTEMI) myocardial infarction: Secondary | ICD-10-CM

## 2022-06-14 DIAGNOSIS — Z955 Presence of coronary angioplasty implant and graft: Secondary | ICD-10-CM

## 2022-06-16 ENCOUNTER — Encounter (HOSPITAL_COMMUNITY)
Admission: RE | Admit: 2022-06-16 | Discharge: 2022-06-16 | Disposition: A | Discharge status: Home / Self Care | Payer: Medicare Other | Source: Ambulatory Visit | Attending: Cardiology | Admitting: Cardiology

## 2022-06-16 DIAGNOSIS — I214 Non-ST elevation (NSTEMI) myocardial infarction: Secondary | ICD-10-CM | POA: Diagnosis not present

## 2022-06-16 DIAGNOSIS — Z955 Presence of coronary angioplasty implant and graft: Secondary | ICD-10-CM

## 2022-06-19 ENCOUNTER — Encounter (HOSPITAL_COMMUNITY)
Admission: RE | Admit: 2022-06-19 | Discharge: 2022-06-19 | Disposition: A | Discharge status: Home / Self Care | Payer: Medicare Other | Source: Ambulatory Visit | Attending: Cardiology | Admitting: Cardiology

## 2022-06-19 DIAGNOSIS — Z955 Presence of coronary angioplasty implant and graft: Secondary | ICD-10-CM | POA: Diagnosis not present

## 2022-06-19 DIAGNOSIS — I214 Non-ST elevation (NSTEMI) myocardial infarction: Secondary | ICD-10-CM

## 2022-06-20 NOTE — Progress Notes (Signed)
Cardiac Individual Treatment Plan  Patient Details  Name: ADONAI SELSOR MRN: [id number] Date of Birth: [birth year] Referring Provider:   Flowsheet Row INTENSIVE CARDIAC REHAB ORIENT from 03/30/2022 in [facility] & [facility]  Referring Provider Peter [name], M.D.       Initial Encounter Date:  [place] from 03/30/2022 in [facility] for Heart, Vascular, & Lung [facility]  Date 03/30/22       Visit Diagnosis: 01/24/22 NSTEMI  01/24/22 S/P DES RPDA  Patient's Home Medications on Admission:  Current Outpatient Medications:    acetaminophen (TYLENOL) 500 MG tablet, Take 1 tablet (500 mg total) by mouth every 6 (six) hours as needed for mild pain or moderate pain., Disp: 30 tablet, Rfl: 0   aspirin EC 81 MG tablet, Take 81 mg by mouth daily. , Disp: , Rfl:    carvedilol (COREG) 12.5 MG tablet, Take 1 tablet (12.5 mg total) by mouth 2 (two) times daily., Disp: 180 tablet, Rfl: 1   cetirizine (ZYRTEC) 10 MG tablet, Take 10 mg by mouth daily., Disp: , Rfl:    cholecalciferol (VITAMIN D3) 25 MCG (1000 UNIT) tablet, Take 1,000 Units by mouth daily., Disp: , Rfl:    clopidogrel (PLAVIX) 75 MG tablet, Take 1 tablet (75 mg total) by mouth daily with breakfast., Disp: 90 tablet, Rfl: 2   Cyanocobalamin (B-12 PO), Take by mouth., Disp: , Rfl:    dapagliflozin propanediol (FARXIGA) 10 MG TABS tablet, Take 1 tablet (10 mg total) by mouth daily before breakfast., Disp: 30 tablet, Rfl: 11   ezetimibe (ZETIA) 10 MG tablet, Take 10 mg by mouth daily., Disp: , Rfl:    meclizine (ANTIVERT) 25 MG tablet, Take 1 tablet (25 mg total) by mouth 3 (three) times daily as needed for dizziness., Disp: 30 tablet, Rfl: 0   Melatonin 10 MG TABS, Take 10 mg by mouth at bedtime as needed (sleep)., Disp: , Rfl:    metFORMIN (GLUCOPHAGE-XR) 500 MG 24 hr tablet, Take 500 mg by mouth daily., Disp: , Rfl:    pantoprazole  (PROTONIX) 20 MG tablet, Take 20 mg by mouth daily before breakfast. 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 ALS 04/20/22    Labs: Review Flowsheet  More data exists      Latest Ref Rng & Units 05/28/2021 01/21/2022 01/27/2022 01/31/2022 03/14/2022  Labs for ITP Cardiac and Pulmonary Rehab  Cholestrol 100 - 199 mg/dL - 190  - - 140   LDL (calc) 0 - 99 mg/dL - 97  - - 66   HDL-C >39 mg/dL - 40  - - 44   Trlycerides 0 - 149 mg/dL - 264  - - 181   Hemoglobin A1c 4.8 - 5.6 % 7.2  7.9  - - -  PH, Arterial 7.35 - 7.45 - - - 7.37  -  PCO2 arterial 32 - 48 mmHg - - - 66  -  Bicarbonate 20.0 - 28.0 mmol/L - - 28.8  38.2  -  TCO2 22 - 32 mmol/L - - 30  - -  O2 Saturation % - - 96  97.8  -    Capillary Blood Glucose: Lab Results  Component Value Date   GLUCAP 185 (H) 06/05/2022   GLUCAP 196 (H)  06/02/2022   GLUCAP 174 (H) 04/24/2022   GLUCAP 157 (H) 04/10/2022   GLUCAP 167 (H) 04/07/2022     Exercise Target Goals: Exercise Program Goal: Individual exercise prescription set using results from initial 6 min walk test and THRR while considering  patient's activity barriers and safety.   Exercise Prescription Goal: Initial exercise prescription builds to 30-45 minutes a day of aerobic activity, 2-3 days per week.  Home exercise guidelines will be given to patient during program as part of exercise prescription that the participant will acknowledge.  Activity Barriers & Risk Stratification:  Activity Barriers & Cardiac Risk Stratification - 03/30/22 1204       Activity Barriers & Cardiac Risk Stratification   Activity Barriers Neck/Spine Problems;Deconditioning;Shortness of Breath;Balance Concerns;History of Falls;Other (comment)    Comments Using concentrator / O2 (2 L/min)    Cardiac Risk Stratification High             6 Minute Walk:  6 Minute Walk     Row Name 03/30/22 1157 05/31/22 1255       6 Minute Walk   Phase Initial  Nustep test performed Discharge    Distance 1312 feet  (.40 km equiviant on nustep) 2001 feet    Distance % Change -- 52.5 %    Distance Feet Change -- 689 ft    Walk Time 6 minutes 6 minutes    # of Rest Breaks 0 0    MPH 1.94 3.8    METS 2.5 3.3    RPE 9 11    Perceived Dyspnea  0 0    VO2 Peak 6.81 11.55    Symptoms No No    Resting HR 76 bpm 58 bpm    Resting BP 114/70 118/76    Resting Oxygen Saturation  97 % 95 %    Exercise Oxygen Saturation  during 6 min walk 98 % 93 %    Max Ex. HR 75 bpm 58 bpm    Max Ex. BP 130/80 126/74    2 Minute Post BP 122/78 --      Interval HR   1 Minute HR 76 84    2 Minute HR 73 85    3 Minute HR 73 92    4 Minute HR 75 94    5 Minute HR 75  96    6 Minute HR 74 92    2 Minute Post HR 70 --    Interval Heart Rate? Yes Yes      Interval Oxygen   Interval Oxygen? Yes Yes    Baseline  Oxygen Saturation % 97 % 95 %    1 Minute Oxygen Saturation % 98 % 93 %    1 Minute Liters of Oxygen 2 L 1 L    2 Minute Oxygen Saturation % 95 % 93 %    2 Minute Liters of Oxygen 2 L 1 L    3 Minute Oxygen Saturation % 97 % 94 %    3 Minute Liters of Oxygen 2 L 1 L    4 Minute Oxygen Saturation % 97 % 94 %    4 Minute Liters of Oxygen 2 L 1 L    5 Minute Oxygen Saturation % 98 % 95 %    5 Minute Liters of Oxygen 2 L 1 L    6 Minute Oxygen Saturation % 98 % 93 %    6 Minute Liters of Oxygen 2 L 1 L    2 Minute Post Oxygen Saturation % 99 % --    2 Minute Post Liters of Oxygen 2 L --             Oxygen Initial Assessment:   Oxygen Re-Evaluation:   Oxygen Discharge (Final Oxygen Re-Evaluation):   Initial Exercise Prescription:  Initial Exercise Prescription - 03/30/22 1200       Date of Initial Exercise RX and Referring Provider   Date 03/30/22    Referring Provider Peter [name], M.D.    Expected Discharge Date 05/26/22      Oxygen   Oxygen Continuous    Liters 2      NuStep   Level 2    SPM 75    Minutes 15    METs 2.5      Arm Ergometer   Level 1    Watts 25    RPM 60    Minutes 15    METs 2.5      Prescription Details   Frequency (times per week) 3    Duration Progress to 30 minutes of continuous aerobic without signs/symptoms of physical distress      Intensity   THRR 40-80% of Max Heartrate 55-110    Ratings of Perceived Exertion 11-13    Perceived Dyspnea 0-4      Progression   Progression Continue progressive overload as per policy without signs/symptoms or physical distress.      Resistance Training   Training Prescription Yes    Weight 2 lbs    Reps 10-15             Perform Capillary Blood Glucose checks as needed.  Exercise Prescription Changes:   Exercise Prescription Changes     Row Name 04/03/22 1500 04/21/22 1600 05/05/22 1400 05/26/22 1400 05/26/22 1500     Response to Exercise   Blood Pressure (Admit) 126/84  116/80 122/64 118/60 (P)  118/60   Blood Pressure (Exercise) 142/80 122/80 142/82 148/82 (P)  148/82   Blood Pressure (Exit) 110/76 104/72 110/60 110/68 (P)  110/68   Heart Rate (Admit) 74 bpm 75 bpm 72 bpm 76 bpm (P)  76 bpm   Heart Rate (Exercise) 88 bpm 104 bpm 102 bpm 99 bpm (P)  104 bpm   Heart Rate (Exit) 80 bpm 81 bpm 81 bpm -- 84 bpm  Oxygen Saturation (Admit) 97 % 93 % 95 % -- 93 %   Oxygen Saturation (Exercise) 97 % 92 % 94 % -- 93 %   Oxygen Saturation (Exit) 95 % 92 % 93 % -- 92 %   Rating of Perceived Exertion (Exercise) '10 12 12 '$ -- 11   Perceived Dyspnea (Exercise) 0 0 0 -- 0   Symptoms None None None -- None   Comments Pt's first day in the CRP2 program Reviewed METs Reviewed METs and goals -- Reviewed METs and goals: Pt using 1 L/min O2 via n/c   Duration Progress to 30 minutes of  aerobic without signs/symptoms of physical distress Continue with 30 min of aerobic exercise without signs/symptoms of physical distress. Continue with 30 min of aerobic exercise without signs/symptoms of physical distress. -- Continue with 30 min of aerobic exercise without signs/symptoms of physical distress.   Intensity THRR unchanged THRR unchanged THRR unchanged -- THRR unchanged     Progression   Progression Continue to progress workloads to maintain intensity without signs/symptoms of physical distress. Continue to progress workloads to maintain intensity without signs/symptoms of physical distress. Continue to progress workloads to maintain intensity without signs/symptoms of physical distress. -- Continue to progress workloads to maintain intensity without signs/symptoms of physical distress.   Average METs 1.7 2.15 2.2 -- 2.2     Resistance Training   Training Prescription Yes Yes Yes -- Yes   Weight 2 lbs 2 lbs 3 lbs -- 2 lbs   Reps 10-15 10-15 10-15 -- 10-15   Time 10 Minutes  10 10 Minutes 10 Minutes -- 10 Minutes     Interval Training   Interval Training -- No No -- No     Oxygen    Oxygen Continuous Continuous Continuous -- Continuous   Liters '2 1 1 '$ -- 1     NuStep   Level '2 2 2 '$ -- 2   SPM 70 102 108 -- 102   Minutes '15 15 15 '$ -- 15   METs 1.6 2.4 1.9 -- 2     Arm Ergometer   Level '1 2 2 '$ -- 2   Watts -- 10 13 -- 15   RPM 37 43 47 -- 55   Minutes '10 15 15 '$ -- 15   METs 1.8 1.9 2.2 -- 2.4     Oxygen   Maintain Oxygen Saturation -- 88% or higher -- -- --    Row Name 05/31/22 1400             Home Exercise Plan   Plans to continue exercise at [facility] [employer] (comment)       Frequency Add 2 additional days to program exercise sessions.       Initial Home Exercises Provided 05/31/22                Exercise Comments:   Exercise Comments     Row Name 04/03/22 1507 04/21/22 1608 05/05/22 1428 05/19/22 1500 05/19/22 1623   Exercise Comments Pt's first day in the CRP2 program. Pt tolerated the exercise session well without complaints. Reviewed METs. Pt is progressing. Pt had no complaints with today's session. Reviewed Goals and METs with pt today. Pt now uisng 1 liter of O2 with exercise. Pt due for MET review today but did not attend class. Will complete when patient returns. --    Row Name 05/23/22 9166 05/26/22 1631 05/31/22 1439       Exercise Comments -- Reviewed METs and goals. Continue to encourage  increase in workloads. Pt is eagar to be extended to complete sessions. Reviewed home exercise Rx with patient today. Pt voices that there is a gym at a chruch near his home which he can walk at. Encouraged pt to walk 2x/week for 30 minutes. Pt verbalized understanding of the home exercise Rx and he was provied a copy.              Exercise Goals and Review:   Exercise Goals     Row Name 03/30/22 1205             Exercise Goals   Increase Physical Activity Yes       Intervention Provide advice, education, support and counseling about physical activity/exercise needs.;Develop an individualized exercise prescription for aerobic and  resistive training based on initial evaluation findings, risk stratification, comorbidities and participant's personal goals.       Expected Outcomes Short Term: Attend rehab on a regular basis to increase amount of physical activity.;Long Term: Add in home exercise to make exercise part of routine and to increase amount of physical activity.;Long Term: Exercising regularly at least 3-5 days a week.       Increase Strength and Stamina Yes       Intervention Provide advice, education, support and counseling about physical activity/exercise needs.;Develop an individualized exercise prescription for aerobic and resistive training based on initial evaluation findings, risk stratification, comorbidities and participant's personal goals.       Expected Outcomes Short Term: Increase workloads from initial exercise prescription for resistance, speed, and METs.;Short Term: Perform resistance training exercises routinely during rehab and add in resistance training at home;Long Term: Improve cardiorespiratory fitness, muscular endurance and strength as measured by increased METs and functional capacity (6MWT)       Able to understand and use rate of perceived exertion (RPE) scale Yes       Intervention Provide education and explanation on how to use RPE scale       Expected Outcomes Short Term: Able to use RPE daily in rehab to express subjective intensity level;Long Term:  Able to use RPE to guide intensity level when exercising independently       Knowledge and understanding of Target Heart Rate Range (THRR) Yes       Intervention Provide education and explanation of THRR including how the numbers were predicted and where they are located for reference       Expected Outcomes Short Term: Able to state/look up THRR;Short Term: Able to use daily as guideline for intensity in rehab;Long Term: Able to use THRR to govern intensity when exercising independently       Understanding of Exercise Prescription Yes        Intervention Provide education, explanation, and written materials on patient's individual exercise prescription       Expected Outcomes Short Term: Able to explain program exercise prescription;Long Term: Able to explain home exercise prescription to exercise independently                Exercise Goals Re-Evaluation :  Exercise Goals Re-Evaluation     Row Name 04/03/22 1505 05/05/22 1426 05/26/22 1630         Exercise Goal Re-Evaluation   Exercise Goals Review Increase Physical Activity;Increase Strength and Stamina;Able to understand and use rate of perceived exertion (RPE) scale;Knowledge and understanding of Target Heart Rate Range (THRR);Understanding of Exercise Prescription Increase Physical Activity;Increase Strength and Stamina;Able to understand and use rate of perceived exertion (RPE) scale;Knowledge and understanding of  Target Heart Rate Range (THRR);Understanding of Exercise Prescription Increase Physical Activity;Increase Strength and Stamina;Able to understand and use rate of perceived exertion (RPE) scale;Knowledge and understanding of Target Heart Rate Range (THRR);Understanding of Exercise Prescription     Comments Pt's first day in the CRP2 program. Pt understands the exercise Rx, RPE scale, and THRR> Reviewed METs and goals. Pt voices improvement in upperbody strength, which was a goal. Pt also voices improved stamina. Reviewed METs and goals. Pt continues to voice improvement in upperbody strength, which was a goal. Pt also voices improved stamina. Pt will be extended to complete his exercise sessions.     Expected Outcomes Will continue to monitor patients and progress exercise workloads as tolerated. Will continue to monitor patients and progress exercise workloads as tolerated. Will continue to monitor patients and progress exercise workloads as tolerated.              Discharge Exercise Prescription (Final Exercise Prescription Changes):  Exercise Prescription  Changes - 05/31/22 1400       Home Exercise Plan   Plans to continue exercise at [facility] (comment)    Frequency Add 2 additional days to program exercise sessions.    Initial Home Exercises Provided 05/31/22             Nutrition:  Target Goals: Understanding of nutrition guidelines, daily intake of sodium '1500mg'$ , cholesterol '200mg'$ , calories 30% from fat and 7% or less from saturated fats, daily to have 5 or more servings of fruits and vegetables.  Biometrics:  Pre Biometrics - 03/30/22 0930       Pre Biometrics   Waist Circumference 45.5 inches    Hip Circumference 41.25 inches    Waist to Hip Ratio 1.1 %    Triceps Skinfold 10 mm    % Body Fat 29.5 %    Grip Strength 24 kg    Flexibility --   No performed due to back issues   Single Leg Stand 2.43 seconds             Post Biometrics - 06/02/22 1230        Post  Biometrics   Height 5' 8.5" (1.74 m)    Weight 89 kg    Waist Circumference 46 inches    Hip Circumference 41.5 inches    Waist to Hip Ratio 1.11 %    BMI (Calculated) 29.4    Triceps Skinfold 10 mm    % Body Fat 29.7 %    Grip Strength 24 kg    Flexibility --   Not done   Single Leg Stand 4.06 seconds             Nutrition Therapy Plan and Nutrition Goals:  Nutrition Therapy & Goals - 06/02/22 1346       Nutrition Therapy   Diet Heart Healthy/Carbohydrate Consistent diet    Drug/Food Interactions Statins/Certain Fruits      Personal Nutrition Goals   Comments Goals in progress. He continues to attend the [employer] education series and reports improved knowledge of heart healthy diet. Wade reports making an effort to reduce portion sizes at meals. He does continue to eat out often but he is trying to make more nutrient dense choices such as increased vegetables and reduced fried foods. I suspect sodium intake remains >'1500mg'$ /day due to eating out frequently. He is the primary caregiver for his wife with dementia though he too  has some memory deficits per PCP documentation; this continues to be a barrier to  nutrition intervention.They do have a medical assistant that comes into the home and offers support two days per week. Izrael will continue to benefit from adherance to the [employer] eating plan and participation in [place].      Intervention Plan   Intervention Prescribe, educate and counsel regarding individualized specific dietary modifications aiming towards targeted core components such as weight, hypertension, lipid management, diabetes, heart failure and other comorbidities.;Nutrition handout(s) given to patient.    Expected Outcomes Short Term Goal: Understand basic principles of dietary content, such as calories, fat, sodium, cholesterol and nutrients.;Long Term Goal: Adherence to prescribed nutrition plan.             Nutrition Assessments:  Nutrition Assessments - 06/12/22 0907       Rate Your Plate Scores   Post Score 65            MEDIFICTS Score Key: ?70 Need to make dietary changes  40-70 Heart Healthy Diet ? 40 Therapeutic Level Cholesterol Diet   Flowsheet Row INTENSIVE CARDIAC REHAB from 06/09/2022 in [facility] & Lung [facility]  Picture Your Plate Total Score on Discharge 65      Picture Your Plate Scores: <97 Unhealthy dietary pattern with much room for improvement. 41-50 Dietary pattern unlikely to meet recommendations for good health and room for improvement. 51-60 More healthful dietary pattern, with some room for improvement.  >60 Healthy dietary pattern, although there may be some specific behaviors that could be improved.    Nutrition Goals Re-Evaluation:  Nutrition Goals Re-Evaluation     [place] Name 04/03/22 1316 05/04/22 0815 06/02/22 1346         Goals   Current Weight 192 lb 3.9 oz (87.2 kg) 193 lb 12.6 oz (87.9 kg) 196 lb 3.4 oz (89 kg)     Nutrition Goal -- -- Patient to identify strateiges for managing cardiovascular  risk by attending the weekly [employer] education and nutrition courses.     Comment trigylcerides 181, A1c 7.9 No new labs at this time; most recent labs show trigylcerides 181, A1c 7.9 triglycerides remain elevated 189, A1c improved to 6.9, B12 315     Expected Outcome Gaelan has medical history of hyperlipidemia, HTN, Congestive heart failure, NSTEMI, DM2, acute respiratory failure. Keoki is the primary caretaker for his wife who has dementia. He does very little cooking and reports eating out frequently or choosing many convenience meals. He eats 1-2 meals per day. He is motivated to lose weight. Macdonald will benefit from adhereance to the [employer] eating plan to aid with improvement to triglycerides '149mg'$ /dL, A1c <7%, and sodium intake '1500mg'$  daily. Goals in progress. Jolly reports making an effort to reduce portion sizes at meals. He does continue to eat out often but he is trying to make more nutrient dense choices such as increased vegetables and reduced fried foods. I suspect sodium intake remains >'1500mg'$ /day due to eating out frequently. 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 He continues to attend the [employer] education series and reports improved knowledge of heart healthy diet. Axyl will continue to benefit from adherance to the [employer] eating plan to aid with improvement to triglycerides '149mg'$ /dL, A1c <7%, and sodium intake '1500mg'$  daily. Goals in progress. He continues to attend the [employer] education series and reports improved knowledge of heart healthy diet. Krishav reports making an effort to reduce portion  sizes at meals. He does continue to eat out often (triglycerides remain elevated at 189) but he is trying to make more nutrient dense choices such as increased vegetables and reduced fried foods. I suspect sodium intake remains >'1500mg'$ /day due to eating out frequently. He is the  primary caregiver for his wife with dementia though he too has some memory deficits per PCP documentation; this continues to be a barrier to nutrition intervention.They do have a medical assistant that comes into the home and offers support two days per week.  Tavious will continue to benefit from adherance to the [employer] eating plan and participation in [place].       Personal Goal #2 Re-Evaluation   Personal Goal #2 -- -- Patient to reduce sodium intake to '1500mg'$  per day.       Personal Goal #3 Re-Evaluation   Personal Goal #3 -- -- Patient to identify food sources and limit daily intake of refined carbohydrates, saturated fat, trans fat, and sodium.              Nutrition Goals Re-Evaluation:  Nutrition Goals Re-Evaluation     [place] Name 04/03/22 1316 05/04/22 0815 06/02/22 1346         Goals   Current Weight 192 lb 3.9 oz (87.2 kg) 193 lb 12.6 oz (87.9 kg) 196 lb 3.4 oz (89 kg)     Nutrition Goal -- -- Patient to identify strateiges for managing cardiovascular risk by attending the weekly [employer] education and nutrition courses.     Comment trigylcerides 181, A1c 7.9 No new labs at this time; most recent labs show trigylcerides 181, A1c 7.9 triglycerides remain elevated 189, A1c improved to 6.9, B12 315     Expected Outcome Nitish has medical history of hyperlipidemia, HTN, Congestive heart failure, NSTEMI, DM2, acute respiratory failure. Zeddie is the primary caretaker for his wife who has dementia. He does very little cooking and reports eating out frequently or choosing many convenience meals. He eats 1-2 meals per day. He is motivated to lose weight. Barak will benefit from adhereance to the [employer] eating plan to aid with improvement to triglycerides '149mg'$ /dL, A1c <7%, and sodium intake '1500mg'$  daily. Goals in progress. Laronn reports making an effort to reduce portion sizes at meals. He does continue to eat out often but he is trying to make more nutrient dense choices such as increased  vegetables and reduced fried foods. I suspect sodium intake remains >'1500mg'$ /day due to eating out frequently. 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 He continues to attend the [employer] education series and reports improved knowledge of heart healthy diet. Kairee will continue to benefit from adherance to the [employer] eating plan to aid with improvement to triglycerides '149mg'$ /dL, A1c <7%, and sodium intake '1500mg'$  daily. Goals in progress. He continues to attend the [employer] education series and reports improved knowledge of heart healthy diet. Isac reports making an effort to reduce portion sizes at meals. He does continue to eat out often (triglycerides remain elevated at 189) but he is trying to make more nutrient dense choices such as increased vegetables and reduced fried foods. I suspect sodium intake remains >'1500mg'$ /day due to eating out frequently. He is the primary caregiver for his wife with dementia though he too has some memory deficits per PCP documentation; this continues to be a barrier to nutrition intervention.They do have a medical assistant that  comes into the home and offers support two days per week.  Kaevion will continue to benefit from adherance to the Pritikin eating plan and participation in [place].       Personal Goal #2 Re-Evaluation   Personal Goal #2 -- -- Patient to reduce sodium intake to '1500mg'$  per day.       Personal Goal #3 Re-Evaluation   Personal Goal #3 -- -- Patient to identify food sources and limit daily intake of refined carbohydrates, saturated fat, trans fat, and sodium.              Nutrition Goals Discharge (Final Nutrition Goals Re-Evaluation):  Nutrition Goals Re-Evaluation - 06/02/22 1346       Goals   Current Weight 196 lb 3.4 oz (89 kg)    Nutrition Goal Patient to identify strateiges for managing cardiovascular risk by attending the  weekly Pritikin education and nutrition courses.    Comment triglycerides remain elevated 189, A1c improved to 6.9, B12 315    Expected Outcome Goals in progress. He continues to attend the Pritikin education series and reports improved knowledge of heart healthy diet. Delshon reports making an effort to reduce portion sizes at meals. He does continue to eat out often (triglycerides remain elevated at 189) but he is trying to make more nutrient dense choices such as increased vegetables and reduced fried foods. I suspect sodium intake remains >'1500mg'$ /day due to eating out frequently. 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  [place] will continue to benefit from adherance to the Pritikin eating plan and participation in [place].      Personal Goal #2 Re-Evaluation   Personal Goal #2 Patient to reduce sodium intake to '1500mg'$  per day.      Personal Goal #3 Re-Evaluation   Personal Goal #3 Patient to identify food sources and limit daily intake of refined carbohydrates, saturated fat, trans fat, and sodium.             Psychosocial: Target Goals: Acknowledge presence or absence of significant depression and/or stress, maximize coping skills, provide positive support system. Participant is able to verbalize types and ability to use techniques and skills needed for reducing stress and depression.  Initial Review & Psychosocial Screening:  Initial Psych Review & Screening - 03/30/22 1502       Initial Review   Current issues with Current Stress Concerns    Source of Stress Concerns Family;Chronic Illness    Comments Dave's wife has demetia.      Family Dynamics   Good Support System? Yes   Kelin has his wife, daughter and caregivers who assist with the patient and his wife's  care twice a week     Barriers   Psychosocial barriers to participate in program The patient should benefit from training in stress management and relaxation.      Screening Interventions   Interventions Encouraged to exercise    Expected Outcomes Long Term Goal: Stressors or current issues are controlled or eliminated.             Quality of Life Scores:  Quality of Life - 06/09/22 1424       Quality of Life   Select Quality of Life      Quality of Life Scores   Health/Function  Post 26.37 %    Socioeconomic Post 26.33 %    Psych/Spiritual Post 29.29 %    Family Post 20.4 %    GLOBAL Post 26.08 %            Scores of 19 and below usually indicate a poorer quality of life in these areas.  A difference of  2-3 points is a clinically meaningful difference.  A difference of 2-3 points in the total score of the Quality of Life Index has been associated with significant improvement in overall quality of life, self-image, physical symptoms, and general health in studies assessing change in quality of life.  PHQ-9: Review Flowsheet       04/03/2022 07/21/2014 03/10/2014  Depression screen PHQ 2/9  Decreased Interest 0 0 0  Down, Depressed, Hopeless 0 0 0  PHQ - 2 Score 0 0 0   Interpretation of Total Score  Total Score Depression Severity:  1-4 = Minimal depression, 5-9 = Mild depression, 10-14 = Moderate depression, 15-19 = Moderately severe depression, 20-27 = Severe depression   Psychosocial Evaluation and Intervention:   Psychosocial Re-Evaluation:  Psychosocial Re-Evaluation     [place] Name 04/03/22 1322 04/25/22 1507 05/23/22 1148         Psychosocial Re-Evaluation   Current issues with Current Stress Concerns Current Stress Concerns Current Stress Concerns     Comments Waunita Schooner did not voice any increased concerns or stressors on his first day of exercise. Dave's wife accompanied him to exercise today Waunita Schooner has not  voice any increased concerns or stressors during  exercise. Waunita Schooner has good support from his wife and family Waunita Schooner continues not to voice any increased concerns or stressors during exercise.     Expected Outcomes Waunita Schooner will have controlled or decreased stress upon completion of intensive cardiac rehab Waunita Schooner will have controlled or decreased stress upon completion of intensive cardiac rehab Waunita Schooner will have controlled or decreased stress upon completion of intensive cardiac rehab     Interventions Stress management education;Encouraged to attend Cardiac Rehabilitation for the exercise Stress management education;Encouraged to attend Cardiac Rehabilitation for the exercise Stress management education;Encouraged to attend Cardiac Rehabilitation for the exercise     Continue Psychosocial Services  Follow up required by staff Follow up required by staff Follow up required by staff       Initial Review   Source of Stress Concerns Chronic Illness;Family Chronic Illness;Family Chronic Illness;Family     Comments Will continue to monitor and offer support as needed. Will continue to monitor and offer support as needed. Will continue to monitor and offer support as needed.              Psychosocial Discharge (Final Psychosocial Re-Evaluation):  Psychosocial Re-Evaluation - 05/23/22 1148       Psychosocial Re-Evaluation   Current issues with Current Stress Concerns    Comments Waunita Schooner continues not to voice any increased concerns or stressors during exercise.    Expected Outcomes Waunita Schooner will have controlled or decreased stress upon completion of intensive cardiac rehab    Interventions Stress management education;Encouraged to attend Cardiac Rehabilitation for the exercise    Continue Psychosocial Services  Follow up required by staff      Initial Review   Source of Stress Concerns Chronic Illness;Family    Comments Will continue to monitor and offer support as needed.             Vocational Rehabilitation: Provide vocational rehab assistance to  qualifying candidates.   Vocational  Rehab Evaluation & Intervention:  Vocational Rehab - 03/30/22 1545       Initial Vocational Rehab Evaluation & Intervention   Assessment shows need for Vocational Rehabilitation No   Waunita Schooner is retired and does not need vocational rehab at this time            Education: Education Goals: Education classes will be provided on a weekly basis, covering required topics. Participant will state understanding/return demonstration of topics presented.    Education     Row Name 04/07/22 1500     Education   Cardiac Education Topics Pritikin   Lexicographer Nutrition   Nutrition Calorie Density   Instruction Review Code 1- Verbalizes Understanding   Class Start Time 6644   Class Stop Time 1430   Class Time Calculation (min) 35 min    [place] Name 04/10/22 1500     Education   Cardiac Education Topics [place]   Environmental consultant Exercise   Exercise Workshop Exercise Basics: Building Your Action Plan   Instruction Review Code 1- Verbalizes Understanding   Class Start Time 1404   Class Stop Time 1459   Class Time Calculation (min) 55 min    [place] Name 04/12/22 1500     Education   Cardiac Education Topics Pritikin   Financial trader   Weekly Topic Efficiency Cooking - Meals in a Snap   Instruction Review Code 1- Verbalizes Understanding   Class Start Time 0347   Class Stop Time 1452   Class Time Calculation (min) 57 min    [place] Name 04/17/22 1600     Education   Cardiac Education Topics Pritikin   Charity fundraiser Exercise Physiologist   Select Nutrition   Nutrition Nutrition Action Plan   Instruction Review Code 1- Verbalizes Understanding   Class Start Time 1400   Class Stop Time 1435   Class Time Calculation (min) 35 min    [place] Name 04/19/22  1500     Education   Cardiac Education Topics Pritikin   Financial trader   Weekly Topic Fast and Healthy Breakfasts   Instruction Review Code 1- Verbalizes Understanding   Class Start Time 1400   Class Stop Time 1450   Class Time Calculation (min) 50 min    [place] Name 04/24/22 1500     Education   Cardiac Education Topics Pritikin   Education officer, community --   Select --   Psychosocial --   Instruction Review Code --   Class Start Time --   Class Stop Time --   Class Time Calculation (min) --     Workshops   Dentist Psychosocial   Exercise Workshop Other  From [employer] to Heart:  The Power of a Healthy Outlook   Instruction Review Code 1- Verbalizes Understanding   Class Start Time 1406   Class Stop Time 1500   Class Time Calculation (min) 54 min    [place] Name 05/03/22 1600     Education   Cardiac Education Topics Pritikin   Financial trader  Weekly Topic Fast Evening Meals   Instruction Review Code 1- Verbalizes Understanding   Class Start Time 1355   Class Stop Time 1435   Class Time Calculation (min) 40 min    [place] Name 05/05/22 1500     Education   Cardiac Education Topics Pritikin   Charity fundraiser Exercise Physiologist   Select Psychosocial   Psychosocial How Our Thoughts Can Heal Our Hearts   Instruction Review Code 1- Verbalizes Understanding   Class Start Time 1357   Class Stop Time 1440   Class Time Calculation (min) 43 min    [place] Name 05/08/22 1500     Education   Cardiac Education Topics Pritikin   IT sales professional Nutrition   Nutrition Workshop Fueling a Designer, multimedia   Instruction Review Code 1- Engineer, civil (consulting) Start Time 1300   Class Stop Time 1345   Class Time Calculation (min) 45 min    [place] Name  05/10/22 1500     Education   Cardiac Education Topics Pritikin   Financial trader   Weekly Topic International Cuisine- Spotlight on the [place] Zones   Instruction Review Code 1- Verbalizes Understanding   Class Start Time 1355   Class Stop Time 1435   Class Time Calculation (min) 40 min    [place] Name 05/17/22 1600     Education   Cardiac Education Topics Pritikin   Charity fundraiser Exercise Physiologist   Select Nutrition   Nutrition Cooking - Healthy Salads and Dressing   Instruction Review Code 1- Verbalizes Understanding   Class Start Time 1358   Class Stop Time 1442   Class Time Calculation (min) 44 min    [place] Name 05/26/22 1500     Education   Cardiac Education Topics Pritikin   Lexicographer Nutrition   Nutrition Overview of the [place]   Instruction Review Code 1- Verbalizes Understanding   Class Start Time 1400   Class Stop Time 1440   Class Time Calculation (min) 40 min    [place] Name 05/31/22 1500     Education   Cardiac Education Topics [employer]   Educator Dietitian   Weekly Topic Personalizing Your Pritikin Plate   Instruction Review Code 1- Verbalizes Understanding   Class Start Time 1403   Class Stop Time 1458   Class Time Calculation (min) 55 min    [place] Name 06/02/22 1400     Education   Cardiac Education Topics Pritikin   Charity fundraiser Exercise Physiologist   Select Psychosocial   Psychosocial Healthy Minds, Bodies, Hearts   Instruction Review Code 1- Verbalizes Understanding   Class Start Time 1358   Class Stop Time 1432   Class Time Calculation (min) 34 min    [place] Name 06/05/22 1600     Education   Cardiac Education Topics Pritikin   IT sales professional Nutrition    Nutrition Workshop Label Reading   Instruction Review Code 1- Engineer, civil (consulting) Start Time 1400  Class Stop Time 1458   Class Time Calculation (min) 58 min    [place] Name 06/08/22 1000     Education   Cardiac Education Topics [place]  completed 06/07/22     Cooking School   Educator Dietitian   Weekly Topic [employer] Desserts   Instruction Review Code 1- Verbalizes Understanding   Class Start Time 1400   Class Stop Time 1458   Class Time Calculation (min) 58 min    [place] Name 06/09/22 1500     Education   Cardiac Education Topics Pritikin   Lexicographer Nutrition   Nutrition Other  Label Reading   Instruction Review Code 1- Verbalizes Understanding   Class Start Time 1400   Class Stop Time 1448   Class Time Calculation (min) 48 min    [place] Name 06/12/22 1400     Education   Cardiac Education Topics Pritikin   Environmental consultant Psychosocial   Psychosocial Workshop --  Focused Goals, Sustainable changes   Instruction Review Code 1- Verbalizes Understanding   Class Start Time 1402   Class Stop Time 1445   Class Time Calculation (min) 43 min    [place] Name 06/14/22 1600     Education   Cardiac Education Topics Pritikin   Financial trader   Weekly Topic Tasty Appetizers and Snacks   Instruction Review Code 1- Verbalizes Understanding   Class Start Time 1402   Class Stop Time 1448   Class Time Calculation (min) 46 min    [place] Name 06/16/22 1500     Education   Cardiac Education Topics Pritikin   Scientist, research (life sciences)   Educator Dietitian   Nutrition Calorie Density   Instruction Review Code 1- Verbalizes Understanding   Class Start Time 1400   Class Stop Time 1442   Class Time Calculation (min) 42 min    [place] Name 06/19/22 1600     Education   Cardiac  Education Topics [place]   Select Workshops     Workshops   Educator Exercise Physiologist   Select Exercise   Exercise Workshop Exercise Basics: Building Your Action Plan   Instruction Review Code 1- Verbalizes Understanding   Class Start Time 1359   Class Stop Time 1445   Class Time Calculation (min) 46 min            Core Videos: Exercise    Move It!  Clinical staff conducted group or individual video education with verbal and written material and guidebook.  Patient learns the recommended Pritikin exercise program. Exercise with the goal of living a long, healthy life. Some of the health benefits of exercise include controlled diabetes, healthier blood pressure levels, improved cholesterol levels, improved heart and lung capacity, improved sleep, and better body composition. Everyone should speak with their doctor before starting or changing an exercise routine.  Biomechanical Limitations Clinical staff conducted group or individual video education with verbal and written material and guidebook.  Patient learns how biomechanical limitations can impact exercise and how we can mitigate and possibly overcome limitations to have an impactful and balanced exercise routine.  Body Composition Clinical staff conducted group or individual video education with verbal and written material and guidebook.  Patient learns that body composition (  ratio of muscle mass to fat mass) is a key component to assessing overall fitness, rather than body weight alone. Increased fat mass, especially visceral belly fat, can put [place] at increased risk for metabolic syndrome, type 2 diabetes, heart disease, and even death. It is recommended to combine diet and exercise (cardiovascular and resistance training) to improve your body composition. Seek guidance from your physician and exercise physiologist before implementing an exercise routine.  Exercise Action Plan Clinical staff conducted group or individual  video education with verbal and written material and guidebook.  Patient learns the recommended strategies to achieve and enjoy long-term exercise adherence, including variety, self-motivation, self-efficacy, and positive decision making. Benefits of exercise include fitness, good health, weight management, more energy, better sleep, less stress, and overall well-being.  Medical   Heart Disease Risk Reduction Clinical staff conducted group or individual video education with verbal and written material and guidebook.  Patient learns our heart is our most vital organ as it circulates oxygen, nutrients, white blood cells, and hormones throughout the entire body, and carries waste away. Data supports a plant-based eating plan like the Pritikin Program for its effectiveness in slowing progression of and reversing heart disease. The video provides a number of recommendations to address heart disease.   Metabolic Syndrome and Belly Fat  Clinical staff conducted group or individual video education with verbal and written material and guidebook.  Patient learns what metabolic syndrome is, how it leads to heart disease, and how one can reverse it and keep it from coming back. You have metabolic syndrome if you have 3 of the following 5 criteria: abdominal obesity, high blood pressure, high triglycerides, low HDL cholesterol, and high blood sugar.  Hypertension and Heart Disease Clinical staff conducted group or individual video education with verbal and written material and guidebook.  Patient learns that high blood pressure, or hypertension, is very common in the [place]. Hypertension is largely due to excessive salt intake, but other important risk factors include being overweight, physical inactivity, drinking too much alcohol, smoking, and not eating enough potassium from fruits and vegetables. High blood pressure is a leading risk factor for heart attack, stroke, congestive heart failure, dementia,  kidney failure, and premature death. Long-term effects of excessive salt intake include stiffening of the arteries and thickening of heart muscle and organ damage. Recommendations include ways to reduce hypertension and the risk of heart disease.  Diseases of Our Time - Focusing on Diabetes Clinical staff conducted group or individual video education with verbal and written material and guidebook.  Patient learns why the best way to stop diseases of our time is prevention, through food and other lifestyle changes. Medicine (such as prescription pills and surgeries) is often only a Band-Aid on the problem, not a long-term solution. Most common diseases of our time include obesity, type 2 diabetes, hypertension, heart disease, and cancer. The Pritikin Program is recommended and has been proven to help reduce, reverse, and/or prevent the damaging effects of metabolic syndrome.  Nutrition   Overview of the Pritikin Eating Plan  Clinical staff conducted group or individual video education with verbal and written material and guidebook.  Patient learns about the [place] for disease risk reduction. The [place] emphasizes a wide variety of unrefined, minimally-processed carbohydrates, like fruits, vegetables, whole grains, and legumes. Go, Caution, and Stop food choices are explained. Plant-based and lean animal proteins are emphasized. Rationale provided for low sodium intake for blood pressure control, low added sugars for blood  sugar stabilization, and low added fats and oils for coronary artery disease risk reduction and weight management.  Calorie Density  Clinical staff conducted group or individual video education with verbal and written material and guidebook.  Patient learns about calorie density and how it impacts the Pritikin Eating Plan. Knowing the characteristics of the food you choose will help you decide whether those foods will lead to weight gain or weight loss, and  whether you want to consume more or less of them. Weight loss is usually a side effect of the Pritikin Eating Plan because of its focus on low calorie-dense foods.  Label Reading  Clinical staff conducted group or individual video education with verbal and written material and guidebook.  Patient learns about the Pritikin recommended label reading guidelines and corresponding recommendations regarding calorie density, added sugars, sodium content, and whole grains.  Dining Out - Part 1  Clinical staff conducted group or individual video education with verbal and written material and guidebook.  Patient learns that restaurant meals can be sabotaging because they can be so high in calories, fat, sodium, and/or sugar. Patient learns recommended strategies on how to positively address this and avoid unhealthy pitfalls.  Facts on Fats  Clinical staff conducted group or individual video education with verbal and written material and guidebook.  Patient learns that lifestyle modifications can be just as effective, if not more so, as many medications for lowering your risk of heart disease. A Pritikin lifestyle can help to reduce your risk of inflammation and atherosclerosis (cholesterol build-up, or plaque, in the artery walls). Lifestyle interventions such as dietary choices and physical activity address the cause of atherosclerosis. A review of the types of fats and their impact on blood cholesterol levels, along with dietary recommendations to reduce fat intake is also included.  Nutrition Action Plan  Clinical staff conducted group or individual video education with verbal and written material and guidebook.  Patient learns how to incorporate Pritikin recommendations into their lifestyle. Recommendations include planning and keeping personal health goals in mind as an important part of their success.  Healthy Mind-Set    Healthy Minds, Bodies, Hearts  Clinical staff conducted group or individual  video education with verbal and written material and guidebook.  Patient learns how to identify when they are stressed. Video will discuss the impact of that stress, as well as the many benefits of stress management. Patient will also be introduced to stress management techniques. The way we think, act, and feel has an impact on our hearts.  How Our Thoughts Can Heal Our Hearts  Clinical staff conducted group or individual video education with verbal and written material and guidebook.  Patient learns that negative thoughts can cause depression and anxiety. This can result in negative lifestyle behavior and serious health problems. Cognitive behavioral therapy is an effective method to help control our thoughts in order to change and improve our emotional outlook.  Additional Videos:  Exercise    Improving Performance  Clinical staff conducted group or individual video education with verbal and written material and guidebook.  Patient learns to use a non-linear approach by alternating intensity levels and lengths of time spent exercising to help burn more calories and lose more body fat. Cardiovascular exercise helps improve heart health, metabolism, hormonal balance, blood sugar control, and recovery from fatigue. Resistance training improves strength, endurance, balance, coordination, reaction time, metabolism, and muscle mass. Flexibility exercise improves circulation, posture, and balance. Seek guidance from your physician and exercise physiologist before implementing  an exercise routine and learn your capabilities and proper form for all exercise.  Introduction to Yoga  Clinical staff conducted group or individual video education with verbal and written material and guidebook.  Patient learns about yoga, a discipline of the coming together of mind, breath, and body. The benefits of yoga include improved flexibility, improved range of motion, better posture and core strength, increased lung  function, weight loss, and positive self-image. Yoga's heart health benefits include lowered blood pressure, healthier heart rate, decreased cholesterol and triglyceride levels, improved immune function, and reduced stress. Seek guidance from your physician and exercise physiologist before implementing an exercise routine and learn your capabilities and proper form for all exercise.  Medical   Aging: Enhancing Your Quality of Life  Clinical staff conducted group or individual video education with verbal and written material and guidebook.  Patient learns key strategies and recommendations to stay in good physical health and enhance quality of life, such as prevention strategies, having an advocate, securing a [place], and keeping a list of medications and system for tracking them. It also discusses how to avoid risk for bone loss.  Biology of Weight Control  Clinical staff conducted group or individual video education with verbal and written material and guidebook.  Patient learns that weight gain occurs because we consume more calories than we burn (eating more, moving less). Even if your body weight is normal, you may have higher ratios of fat compared to muscle mass. Too much body fat puts you at increased risk for cardiovascular disease, heart attack, stroke, type 2 diabetes, and obesity-related cancers. In addition to exercise, following the [place] can help reduce your risk.  Decoding Lab Results  Clinical staff conducted group or individual video education with verbal and written material and guidebook.  Patient learns that lab test reflects one measurement whose values change over time and are influenced by many factors, including medication, stress, sleep, exercise, food, hydration, pre-existing medical conditions, and more. It is recommended to use the knowledge from this video to become more involved with your lab results and evaluate your numbers  to speak with your doctor.   Diseases of Our Time - Overview  Clinical staff conducted group or individual video education with verbal and written material and guidebook.  Patient learns that according to the CDC, 50% to 70% of chronic diseases (such as obesity, type 2 diabetes, elevated lipids, hypertension, and heart disease) are avoidable through lifestyle improvements including healthier food choices, listening to satiety cues, and increased physical activity.  Sleep Disorders Clinical staff conducted group or individual video education with verbal and written material and guidebook.  Patient learns how good quality and duration of sleep are important to overall health and well-being. Patient also learns about sleep disorders and how they impact health along with recommendations to address them, including discussing with a physician.  Nutrition  Dining Out - Part 2 Clinical staff conducted group or individual video education with verbal and written material and guidebook.  Patient learns how to plan ahead and communicate in order to maximize their dining experience in a healthy and nutritious manner. Included are recommended food choices based on the type of restaurant the patient is visiting.   Fueling a Best boy conducted group or individual video education with verbal and written material and guidebook.  There is a strong connection between our food choices and our health. Diseases like obesity and type 2 diabetes are  very prevalent and are in large-part due to lifestyle choices. The Pritikin Eating Plan provides plenty of food and hunger-curbing satisfaction. It is easy to follow, affordable, and helps reduce health risks.  Menu Workshop  Clinical staff conducted group or individual video education with verbal and written material and guidebook.  Patient learns that restaurant meals can sabotage health goals because they are often packed with calories, fat, sodium,  and sugar. Recommendations include strategies to plan ahead and to communicate with the manager, chef, or server to help order a healthier meal.  Planning Your Eating Strategy  Clinical staff conducted group or individual video education with verbal and written material and guidebook.  Patient learns about the [place] and its benefit of reducing the risk of disease. The [place] does not focus on calories. Instead, it emphasizes high-quality, nutrient-rich foods. By knowing the characteristics of the foods, we choose, we can determine their calorie density and make informed decisions.  Targeting Your Nutrition Priorities  Clinical staff conducted group or individual video education with verbal and written material and guidebook.  Patient learns that lifestyle habits have a tremendous impact on disease risk and progression. This video provides eating and physical activity recommendations based on your personal health goals, such as reducing LDL cholesterol, losing weight, preventing or controlling type 2 diabetes, and reducing high blood pressure.  Vitamins and Minerals  Clinical staff conducted group or individual video education with verbal and written material and guidebook.  Patient learns different ways to obtain key vitamins and minerals, including through a recommended healthy diet. It is important to discuss all supplements you take with your doctor.   Healthy Mind-Set    Smoking Cessation  Clinical staff conducted group or individual video education with verbal and written material and guidebook.  Patient learns that cigarette smoking and tobacco addiction pose a serious health risk which affects millions of people. Stopping smoking will significantly reduce the risk of heart disease, lung disease, and many forms of cancer. Recommended strategies for quitting are covered, including working with your doctor to develop a successful plan.  Culinary   Becoming a  Financial trader conducted group or individual video education with verbal and written material and guidebook.  Patient learns that cooking at home can be healthy, cost-effective, quick, and puts them in control. Keys to cooking healthy recipes will include looking at your recipe, assessing your equipment needs, planning ahead, making it simple, choosing cost-effective seasonal ingredients, and limiting the use of added fats, salts, and sugars.  Cooking - Breakfast and Snacks  Clinical staff conducted group or individual video education with verbal and written material and guidebook.  Patient learns how important breakfast is to satiety and nutrition through the entire day. Recommendations include key foods to eat during breakfast to help stabilize blood sugar levels and to prevent overeating at meals later in the day. Planning ahead is also a key component.  Cooking - Human resources officer conducted group or individual video education with verbal and written material and guidebook.  Patient learns eating strategies to improve overall health, including an approach to cook more at home. Recommendations include thinking of animal protein as a side on your plate rather than center stage and focusing instead on lower calorie dense options like vegetables, fruits, whole grains, and plant-based proteins, such as beans. Making sauces in large quantities to freeze for later and leaving the skin on your vegetables are also recommended to maximize your  experience.  Cooking - Healthy Salads and Dressing Clinical staff conducted group or individual video education with verbal and written material and guidebook.  Patient learns that vegetables, fruits, whole grains, and legumes are the foundations of the [place]. Recommendations include how to incorporate each of these in flavorful and healthy salads, and how to create homemade salad dressings. Proper handling of ingredients is  also covered. Cooking - Soups and [employer] - Soups and Desserts Clinical staff conducted group or individual video education with verbal and written material and guidebook.  Patient learns that Pritikin soups and desserts make for easy, nutritious, and delicious snacks and meal components that are low in sodium, fat, sugar, and calorie density, while high in vitamins, minerals, and filling fiber. Recommendations include simple and healthy ideas for soups and desserts.   Overview     The Pritikin Solution Program Overview Clinical staff conducted group or individual video education with verbal and written material and guidebook.  Patient learns that the results of the [place] Program have been documented in more than 100 articles published in peer-reviewed journals, and the benefits include reducing risk factors for (and, in some cases, even reversing) high cholesterol, high blood pressure, type 2 diabetes, obesity, and more! An overview of the three key pillars of the Pritikin Program will be covered: eating well, doing regular exercise, and having a healthy mind-set.  WORKSHOPS  Exercise: Exercise Basics: Building Your Action Plan Clinical staff led group instruction and group discussion with PowerPoint presentation and patient guidebook. To enhance the learning environment the use of posters, models and videos may be added. At the conclusion of this workshop, patients will comprehend the difference between physical activity and exercise, as well as the benefits of incorporating both, into their routine. Patients will understand the FITT (Frequency, Intensity, Time, and Type) principle and how to use it to build an exercise action plan. In addition, safety concerns and other considerations for exercise and cardiac rehab will be addressed by the presenter. The purpose of this lesson is to promote a comprehensive and effective weekly exercise routine in order to improve patients' overall  level of fitness.   Managing Heart Disease: Your Path to a Healthier Heart Clinical staff led group instruction and group discussion with PowerPoint presentation and patient guidebook. To enhance the learning environment the use of posters, models and videos may be added.At the conclusion of this workshop, patients will understand the anatomy and physiology of the heart. Additionally, they will understand how Pritikin's three pillars impact the risk factors, the progression, and the management of heart disease.  The purpose of this lesson is to provide a high-level overview of the heart, heart disease, and how the Pritikin lifestyle positively impacts risk factors.  Exercise Biomechanics Clinical staff led group instruction and group discussion with PowerPoint presentation and patient guidebook. To enhance the learning environment the use of posters, models and videos may be added. Patients will learn how the structural parts of their bodies function and how these functions impact their daily activities, movement, and exercise. Patients will learn how to promote a neutral spine, learn how to manage pain, and identify ways to improve their physical movement in order to promote healthy living. The purpose of this lesson is to expose patients to common physical limitations that impact physical activity. Participants will learn practical ways to adapt and manage aches and pains, and to minimize their effect on regular exercise. Patients will learn how to maintain good posture while sitting,  walking, and lifting.  Balance Training and Fall Prevention  Clinical staff led group instruction and group discussion with PowerPoint presentation and patient guidebook. To enhance the learning environment the use of posters, models and videos may be added. At the conclusion of this workshop, patients will understand the importance of their sensorimotor skills (vision, proprioception, and the vestibular  system) in maintaining their ability to balance as they age. Patients will apply a variety of balancing exercises that are appropriate for their current level of function. Patients will understand the common causes for poor balance, possible solutions to these problems, and ways to modify their physical environment in order to minimize their fall risk. The purpose of this lesson is to teach patients about the importance of maintaining balance as they age and ways to minimize their risk of falling.  WORKSHOPS   Nutrition:  Fueling a Scientist, research (physical sciences) led group instruction and group discussion with PowerPoint presentation and patient guidebook. To enhance the learning environment the use of posters, models and videos may be added. Patients will review the foundational principles of the [place] and understand what constitutes a serving size in each of the food groups. Patients will also learn Pritikin-friendly foods that are better choices when away from home and review make-ahead meal and snack options. Calorie density will be reviewed and applied to three nutrition priorities: weight maintenance, weight loss, and weight gain. The purpose of this lesson is to reinforce (in a group setting) the key concepts around what patients are recommended to eat and how to apply these guidelines when away from home by planning and selecting Pritikin-friendly options. Patients will understand how calorie density may be adjusted for different weight management goals.  Mindful Eating  Clinical staff led group instruction and group discussion with PowerPoint presentation and patient guidebook. To enhance the learning environment the use of posters, models and videos may be added. Patients will briefly review the concepts of the [place] and the importance of low-calorie dense foods. The concept of mindful eating will be introduced as well as the importance of paying attention to internal  hunger signals. Triggers for non-hunger eating and techniques for dealing with triggers will be explored. The purpose of this lesson is to provide patients with the opportunity to review the basic principles of the [place], discuss the value of eating mindfully and how to measure internal cues of hunger and fullness using the Hunger Scale. Patients will also discuss reasons for non-hunger eating and learn strategies to use for controlling emotional eating.  Targeting Your Nutrition Priorities Clinical staff led group instruction and group discussion with PowerPoint presentation and patient guidebook. To enhance the learning environment the use of posters, models and videos may be added. Patients will learn how to determine their genetic susceptibility to disease by reviewing their family history. Patients will gain insight into the importance of diet as part of an overall healthy lifestyle in mitigating the impact of genetics and other environmental insults. The purpose of this lesson is to provide patients with the opportunity to assess their personal nutrition priorities by looking at their family history, their own health history and current risk factors. Patients will also be able to discuss ways of prioritizing and modifying the [place] for their highest risk areas  Menu  Clinical staff led group instruction and group discussion with PowerPoint presentation and patient guidebook. To enhance the learning environment the use of posters, models and videos may be added.  Using menus brought in from [employer], or printed from [employer], patients will apply the [place] dining out guidelines that were presented in the [employer] video. Patients will also be able to practice these guidelines in a variety of provided scenarios. The purpose of this lesson is to provide patients with the opportunity to practice hands-on learning of the [place] with actual menus and practice scenarios.  Label Reading Clinical staff led group instruction and group discussion with PowerPoint presentation and patient guidebook. To enhance the learning environment the use of posters, models and videos may be added. Patients will review and discuss the Pritikin label reading guidelines presented in Pritikin's Label Reading Educational series video. Using fool labels brought in from local grocery stores and markets, patients will apply the label reading guidelines and determine if the packaged food meet the Pritikin guidelines. The purpose of this lesson is to provide patients with the opportunity to review, discuss, and practice hands-on learning of the Pritikin Label Reading guidelines with actual packaged food labels. [place] Workshops are designed to teach patients ways to prepare quick, simple, and affordable recipes at home. The importance of nutrition's role in chronic disease risk reduction is reflected in its emphasis in the overall Pritikin program. By learning how to prepare essential core Pritikin Eating Plan recipes, patients will increase control over what they eat; be able to customize the flavor of foods without the use of added salt, sugar, or fat; and improve the quality of the food they consume. By learning a set of core recipes which are easily assembled, quickly prepared, and affordable, patients are more likely to prepare more healthy foods at home. These workshops focus on convenient breakfasts, simple entres, side dishes, and desserts which can be prepared with minimal effort and are consistent with nutrition recommendations for cardiovascular risk reduction. Cooking [employer] are taught by a Engineer, materials (RD) who has been trained by the [employer]. The chef or RD has a clear understanding of the importance of minimizing - if not completely eliminating - added fat,  sugar, and sodium in recipes. Throughout the series of [place] Workshop sessions, patients will learn about healthy ingredients and efficient methods of cooking to build confidence in their capability to prepare    Cooking School weekly topics:  Adding Flavor- Sodium-Free  Fast and Healthy Breakfasts  Powerhouse Plant-Based Proteins  Satisfying Salads and Dressings  Simple Sides and Sauces  International Cuisine-Spotlight on the [place] Zones  Delicious Desserts  Savory Soups  Efficiency Cooking - Meals in a Snap  Tasty Appetizers and Snacks  Comforting Weekend Breakfasts  One-Pot Wonders   Fast Evening Meals  Easy [place] (Psychosocial): New Thoughts, New Behaviors Clinical staff led group instruction and group discussion with PowerPoint presentation and patient guidebook. To enhance the learning environment the use of posters, models and videos may be added. Patients will learn and practice techniques for developing effective health and lifestyle goals. Patients will be able to effectively apply the goal setting process learned to develop at least one new personal goal.  The purpose of this lesson is to expose patients to a new skill set of behavior modification techniques such as techniques setting SMART goals, overcoming barriers, and achieving new thoughts and new behaviors.  Managing Moods and Relationships Clinical staff led group instruction and group discussion with PowerPoint presentation and  patient guidebook. To enhance the learning environment the use of posters, models and videos may be added. Patients will learn how emotional and chronic stress factors can impact their health and relationships. They will learn healthy ways to manage their moods and utilize positive coping mechanisms. In addition, ICR patients will learn ways to improve communication skills. The purpose of this lesson is to expose  patients to ways of understanding how one's mood and health are intimately connected. Developing a healthy outlook can help build positive relationships and connections with others. Patients will understand the importance of utilizing effective communication skills that include actively listening and being heard. They will learn and understand the importance of the "4 Cs" and especially Connections in fostering of a Healthy Mind-Set.  Healthy Sleep for a Healthy Heart Clinical staff led group instruction and group discussion with PowerPoint presentation and patient guidebook. To enhance the learning environment the use of posters, models and videos may be added. At the conclusion of this workshop, patients will be able to demonstrate knowledge of the importance of sleep to overall health, well-being, and quality of life. They will understand the symptoms of, and treatments for, common sleep disorders. Patients will also be able to identify daytime and nighttime behaviors which impact sleep, and they will be able to apply these tools to help manage sleep-related challenges. The purpose of this lesson is to provide patients with a general overview of sleep and outline the importance of quality sleep. Patients will learn about a few of the most common sleep disorders. Patients will also be introduced to the concept of "sleep hygiene," and discover ways to self-manage certain sleeping problems through simple daily behavior changes. Finally, the workshop will motivate patients by clarifying the links between quality sleep and their goals of heart-healthy living.   Recognizing and Reducing Stress Clinical staff led group instruction and group discussion with PowerPoint presentation and patient guidebook. To enhance the learning environment the use of posters, models and videos may be added. At the conclusion of this workshop, patients will be able to understand the types of stress reactions, differentiate between  acute and chronic stress, and recognize the impact that chronic stress has on their health. They will also be able to apply different coping mechanisms, such as reframing negative self-talk. Patients will have the opportunity to practice a variety of stress management techniques, such as deep abdominal breathing, progressive muscle relaxation, and/or guided imagery.  The purpose of this lesson is to educate patients on the role of stress in their lives and to provide healthy techniques for coping with it.  Learning Barriers/Preferences:  Learning Barriers/Preferences - 03/30/22 1152       Learning Barriers/Preferences   Learning Barriers Sight;Hearing    Learning Preferences Audio;Verbal Instruction;Computer/Internet;Video;Group Instruction;Written Material;Individual Instruction;Skilled Demonstration;Pictoral             Education Topics:  Knowledge Questionnaire Score:  Knowledge Questionnaire Score - 06/09/22 1425       Knowledge Questionnaire Score   Post Score 21/24             Core Components/Risk Factors/Patient Goals at Admission:  Personal Goals and Risk Factors at Admission - 03/30/22 1154       Core Components/Risk Factors/Patient Goals on Admission    Weight Management Yes;Weight Loss    Intervention Weight Management: Develop a combined nutrition and exercise program designed to reach desired caloric intake, while maintaining appropriate intake of nutrient and fiber, sodium and fats, and appropriate energy expenditure required for  the weight goal.;Weight Management: Provide education and appropriate resources to help participant work on and attain dietary goals.;Weight Management/Obesity: Establish reasonable short term and long term weight goals.    Admit Weight 194 lb 7.1 oz (88.2 kg)    Goal Weight: Long Term 160 lb (72.6 kg)   Pt goal   Expected Outcomes Short Term: Continue to assess and modify interventions until short term weight is achieved;Long Term:  Adherence to nutrition and physical activity/exercise program aimed toward attainment of established weight goal;Weight Maintenance: Understanding of the daily nutrition guidelines, which includes 25-35% calories from fat, 7% or less cal from saturated fats, less than '200mg'$  cholesterol, less than 1.5gm of sodium, & 5 or more servings of fruits and vegetables daily;Weight Loss: Understanding of general recommendations for a balanced deficit meal plan, which promotes 1-2 lb weight loss per week and includes a negative energy balance of [phone] kcal/d;Understanding recommendations for meals to include 15-35% energy as protein, 25-35% energy from fat, 35-60% energy from carbohydrates, less than '200mg'$  of dietary cholesterol, 20-35 gm of total fiber daily    Diabetes Yes    Intervention Provide education about signs/symptoms and action to take for hypo/hyperglycemia.;Provide education about proper nutrition, including hydration, and aerobic/resistive exercise prescription along with prescribed medications to achieve blood glucose in normal ranges: Fasting glucose 65-99 mg/dL    Expected Outcomes Short Term: Participant verbalizes understanding of the signs/symptoms and immediate care of hyper/hypoglycemia, proper foot care and importance of medication, aerobic/resistive exercise and nutrition plan for blood glucose control.;Long Term: Attainment of HbA1C < 7%.    Hypertension Yes    Intervention Provide education on lifestyle modifcations including regular physical activity/exercise, weight management, moderate sodium restriction and increased consumption of fresh fruit, vegetables, and low fat dairy, alcohol moderation, and smoking cessation.;Monitor prescription use compliance.    Expected Outcomes Short Term: Continued assessment and intervention until BP is < 140/20m HG in hypertensive participants. < 130/823mHG in hypertensive participants with diabetes, heart failure or chronic kidney disease.;Long Term:  Maintenance of blood pressure at goal levels.    Lipids Yes    Intervention Provide education and support for participant on nutrition & aerobic/resistive exercise along with prescribed medications to achieve LDL '70mg'$ , HDL >'40mg'$ .    Expected Outcomes Short Term: Participant states understanding of desired cholesterol values and is compliant with medications prescribed. Participant is following exercise prescription and nutrition guidelines.;Long Term: Cholesterol controlled with medications as prescribed, with individualized exercise RX and with personalized nutrition plan. Value goals: LDL < '70mg'$ , HDL > 40 mg.    Stress Yes    Intervention Offer individual and/or small group education and counseling on adjustment to heart disease, stress management and health-related lifestyle change. Teach and support self-help strategies.;Refer participants experiencing significant psychosocial distress to appropriate mental health specialists for further evaluation and treatment. When possible, include family members and significant others in education/counseling sessions.    Expected Outcomes Short Term: Participant demonstrates changes in health-related behavior, relaxation and other stress management skills, ability to obtain effective social support, and compliance with psychotropic medications if prescribed.;Long Term: Emotional wellbeing is indicated by absence of clinically significant psychosocial distress or social isolation.             Core Components/Risk Factors/Patient Goals Review:   Goals and Risk Factor Review     Row Name 04/03/22 1346 04/25/22 1510 05/23/22 1153         Core Components/Risk Factors/Patient Goals Review   Personal Goals Review Weight Management/Obesity;Stress;Hypertension;Lipids;Diabetes Weight Management/Obesity;Stress;Hypertension;Lipids;Diabetes  Weight Management/Obesity;Stress;Hypertension;Lipids;Diabetes     Review Damyen started intensive cardiac rehab on 04/03/22.  Waunita Schooner did well with exercise, vital signs, oxygen saturations and CBG's were stable Nichole has been doing  well with exercise, vital signs, oxygen saturations and CBG's were stable. Dave's oxygen has been decreased to 1 liter/min per Dr [name] and pulmonary. Oxygen saturations have reamined in the low to mid 90's. Waunita Schooner reports having more energy since participating in the program Tyshan continues to do  well with exercise, vital signs, oxygen saturations and CBG's have been stable. Dave's oxygen has been decreased to 1 liter/min per Dr [name] and pulmonary. Oxygen saturations have reamined in the low to mid 90's. Waunita Schooner says participating in intensive cardiac rehab has been helpful for him. Waunita Schooner will complete intensive cardiac rehab on 05/26/22.     Expected Outcomes Revan will continue to participate in intensive cardiac rehab for exercise, nutrition, and lifestyle modifications Karsen will continue to participate in intensive cardiac rehab for exercise, nutrition, and lifestyle modifications Alic will continue to participate in intensive cardiac rehab for exercise, nutrition, and lifestyle modifications              Core Components/Risk Factors/Patient Goals at Discharge (Final Review):   Goals and Risk Factor Review - 05/23/22 1153       Core Components/Risk Factors/Patient Goals Review   Personal Goals Review Weight Management/Obesity;Stress;Hypertension;Lipids;Diabetes    Review Carolos continues to do  well with exercise, vital signs, oxygen saturations and CBG's have been stable. Dave's oxygen has been decreased to 1 liter/min per Dr [name] and pulmonary. Oxygen saturations have reamined in the low to mid 90's. Waunita Schooner says participating in intensive cardiac rehab has been helpful for him. Waunita Schooner will complete intensive cardiac rehab on 05/26/22.    Expected Outcomes Dwon will continue to participate in intensive cardiac rehab for exercise, nutrition, and lifestyle modifications             ITP  Comments:  ITP Comments     Row Name 03/30/22 1456 04/03/22 1321 04/25/22 1503 05/23/22 1149     ITP Comments Dr Fransico Him MD, Medical Director, Introduction to [facility] Education Program/ Intensive Cardiac Rehab. Initial Orientation Packet Reviewed with the Patient. 30 Day ITP Review. Waunita Schooner started intensive cardiac rehab on 04/03/22 and did well with exercise. 30 Day ITP Review. Waunita Schooner has good attendance and participation in [facility] re[facility]. Conlee is enjoying the program 30 Day ITP Review. Waunita Schooner continues to have good  participation in intesive cardiac rehab when in attendance. Wardell is enjoying the program. Waunita Schooner will complete intensive cardiac rehab on 05/26/22             Comments: See ITP comments.

## 2022-06-21 ENCOUNTER — Telehealth: Payer: Self-pay | Admitting: *Deleted

## 2022-06-21 ENCOUNTER — Encounter (HOSPITAL_COMMUNITY)
Admission: RE | Admit: 2022-06-21 | Discharge: 2022-06-21 | Disposition: A | Discharge status: Home / Self Care | Payer: Medicare Other | Source: Ambulatory Visit | Attending: Cardiology | Admitting: Cardiology

## 2022-06-21 DIAGNOSIS — Z955 Presence of coronary angioplasty implant and graft: Secondary | ICD-10-CM | POA: Diagnosis not present

## 2022-06-21 DIAGNOSIS — I214 Non-ST elevation (NSTEMI) myocardial infarction: Secondary | ICD-10-CM

## 2022-06-21 NOTE — Progress Notes (Signed)
  Care Coordination Note  06/21/2022 Name: Jeremiah [name] MRN: [id number] DOB: 1940-12-13  Jeremiah [name] is a 82 y.o. year old male who is a primary care patient of Perini, Elta Guadeloupe, MD and is actively engaged with the care management team. I reached out to [name] by phone today to assist with re-scheduling a follow up visit with the RN Case Manager  Follow up plan: Unsuccessful telephone outreach attempt made. A HIPAA compliant phone message was left for the patient providing contact information and requesting a return call.   Howe  Direct Dial: [phone]

## 2022-06-21 NOTE — Progress Notes (Signed)
Discharge Progress Report  Patient Details  Name: BRAXON JOICE MRN: [id number] Date of Birth: 1940/09/17 Referring Provider:   Flowsheet Row INTENSIVE CARDIAC REHAB ORIENT from 03/30/2022 in [facility]  Referring Provider Peter [name], M.D.        Number of Visits: 79  Reason for Discharge:  Patient reached a stable level of exercise. Patient independent in their exercise. Patient has met program and personal goals.  Smoking History:  Social History   Tobacco Use  Smoking Status Former   Packs/day: 1.00   Types: Cigarettes   Quit date: 03/10/1961   Years since quitting: 61.3  Smokeless Tobacco Never  Tobacco Comments   Pt states he quit smoking 60 plus years ago and he remembers smoking maybe a pack a day. 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    Expected Discharge Date 05/26/22      Oxygen   Oxygen Continuous    Liters 2      NuStep   Level 2    SPM 75    Minutes 15    METs 2.5      Arm Ergometer   Level 1    Watts 25    RPM 60    Minutes 15    METs 2.5      Prescription Details   Frequency (times per week) 3    Duration Progress to 30 minutes of continuous aerobic without signs/symptoms of physical distress      Intensity   THRR 40-80% of Max Heartrate 55-110    Ratings of Perceived Exertion 11-13    Perceived Dyspnea 0-4      Progression   Progression Continue progressive overload as per policy without signs/symptoms or physical distress.      Resistance Training   Training Prescription Yes    Weight 2 lbs    Reps 10-15             Discharge Exercise Prescription (Final Exercise Prescription Changes):  Exercise Prescription Changes -  06/21/22 1445       Response to Exercise   Blood Pressure (Admit) 120/80    Blood Pressure (Exercise) 150/70    Blood Pressure (Exit) 112/74    Heart Rate (Admit) 74 bpm    Heart Rate (Exercise) 93 bpm    Heart Rate (Exit) 83 bpm    Oxygen Saturation (Admit) 96 %    Oxygen Saturation (Exercise) 94 %    Oxygen Saturation (Exit) 94 %    Rating of Perceived Exertion (Exercise) 11    Perceived Dyspnea (Exercise) 0    Symptoms None    Comments Pt graduated from the CRP2 program today    Duration Continue with 30 min of aerobic exercise without signs/symptoms of physical distress.    Intensity THRR unchanged      Progression   Progression Continue to progress workloads to maintain intensity without signs/symptoms of physical distress.    Average METs 2.4      Resistance Training   Training Prescription No    Weight No weights on Wednesdays      Interval Training   Interval Training No      Oxygen  Oxygen Continuous    Liters 1      NuStep   Level 3    SPM 104    Minutes 15    METs 2.5      Arm Ergometer   Level 2.5    Watts 15    Minutes 15    METs 2.3      Home Exercise Plan   Plans to continue exercise at [employer] (comment)    Frequency Add 2 additional days to program exercise sessions.    Initial Home Exercises Provided 05/31/22      Oxygen   Maintain Oxygen Saturation 88% or higher             Functional Capacity:  6 Minute Walk     Row Name 03/30/22 1157 05/31/22 1255       6 Minute Walk   Phase Initial  Nustep test performed Discharge    Distance 1312 feet  (.40 km equiviant on nustep) 2001 feet    Distance % Change -- 52.5 %    Distance Feet Change -- 689 ft    Walk Time 6 minutes 6 minutes    # of Rest Breaks 0 0    MPH 1.94 3.8    METS 2.5 3.3    RPE 9 11    Perceived Dyspnea  0 0    VO2 Peak 6.81 11.55    Symptoms No No    Resting HR 76 bpm 58 bpm    Resting BP 114/70 118/76    Resting Oxygen Saturation  97 % 95 %     Exercise Oxygen Saturation  during 6 min walk 98 % 93 %    Max Ex. HR 75 bpm 58 bpm    Max Ex. BP 130/80 126/74    2 Minute Post BP 122/78 --      Interval HR   1 Minute HR 76 84    2 Minute HR 73 85    3 Minute HR 73 92    4 Minute HR 75 94    5 Minute HR 75 96    6 Minute HR 74 92    2 Minute Post HR 70 --    Interval Heart Rate? Yes Yes      Interval Oxygen   Interval Oxygen? Yes Yes    Baseline Oxygen Saturation % 97 % 95 %    1 Minute Oxygen Saturation % 98 % 93 %    1 Minute Liters of Oxygen 2 L 1 L    2 Minute Oxygen Saturation % 95 % 93 %    2 Minute Liters of Oxygen 2 L 1 L    3 Minute Oxygen Saturation % 97 % 94 %    3 Minute Liters of Oxygen 2 L 1 L    4 Minute Oxygen Saturation % 97 % 94 %    4 Minute Liters of Oxygen 2 L 1 L    5 Minute Oxygen Saturation % 98 % 95 %    5 Minute Liters of Oxygen 2 L 1 L    6 Minute Oxygen Saturation % 98 % 93 %    6 Minute Liters of Oxygen 2 L 1 L    2 Minute Post Oxygen Saturation % 99 % --    2 Minute Post Liters of Oxygen 2 L --             Psychological, QOL, Others - Outcomes: PHQ 2/9:  06/21/2022    2:10 PM 04/03/2022    1:12 PM 07/21/2014   10:29 AM 03/10/2014   12:57 PM  Depression screen PHQ 2/9  Decreased Interest 0 0 0 0  Down, Depressed, Hopeless 0 0 0 0  PHQ - 2 Score 0 0 0 0    Quality of Life:  Quality of Life - 06/09/22 1424       Quality of Life   Select Quality of Life      Quality of Life Scores   Health/Function Post 26.37 %    Socioeconomic Post 26.33 %    Psych/Spiritual Post 29.29 %    Family Post 20.4 %    GLOBAL Post 26.08 %             Personal Goals: Goals established at orientation with interventions provided to work toward goal.  Personal Goals and Risk Factors at Admission - 03/30/22 1154       Core Components/Risk Factors/Patient Goals on Admission    Weight Management Yes;Weight Loss    Intervention Weight Management: Develop a combined nutrition and exercise  program designed to reach desired caloric intake, while maintaining appropriate intake of nutrient and fiber, sodium and fats, and appropriate energy expenditure required for the weight goal.;Weight Management: Provide education and appropriate resources to help participant work on and attain dietary goals.;Weight Management/Obesity: Establish reasonable short term and long term weight goals.    Admit Weight 194 lb 7.1 oz (88.2 kg)    Goal Weight: Long Term 160 lb (72.6 kg)   Pt goal   Expected Outcomes Short Term: Continue to assess and modify interventions until short term weight is achieved;Long Term: Adherence to nutrition and physical activity/exercise program aimed toward attainment of established weight goal;Weight Maintenance: Understanding of the daily nutrition guidelines, which includes 25-35% calories from fat, 7% or less cal from saturated fats, less than 24m cholesterol, less than 1.5gm of sodium, & 5 or more servings of fruits and vegetables daily;Weight Loss: Understanding of general recommendations for a balanced deficit meal plan, which promotes 1-2 lb weight loss per week and includes a negative energy balance of [phone] kcal/d;Understanding recommendations for meals to include 15-35% energy as protein, 25-35% energy from fat, 35-60% energy from carbohydrates, less than 2023mof dietary cholesterol, 20-35 gm of total fiber daily    Diabetes Yes    Intervention Provide education about signs/symptoms and action to take for hypo/hyperglycemia.;Provide education about proper nutrition, including hydration, and aerobic/resistive exercise prescription along with prescribed medications to achieve blood glucose in normal ranges: Fasting glucose 65-99 mg/dL    Expected Outcomes Short Term: Participant verbalizes understanding of the signs/symptoms and immediate care of hyper/hypoglycemia, proper foot care and importance of medication, aerobic/resistive exercise and nutrition plan for blood glucose  control.;Long Term: Attainment of HbA1C < 7%.    Hypertension Yes    Intervention Provide education on lifestyle modifcations including regular physical activity/exercise, weight management, moderate sodium restriction and increased consumption of fresh fruit, vegetables, and low fat dairy, alcohol moderation, and smoking cessation.;Monitor prescription use compliance.    Expected Outcomes Short Term: Continued assessment and intervention until BP is < 140/9061mG in hypertensive participants. < 130/37m34m in hypertensive participants with diabetes, heart failure or chronic kidney disease.;Long Term: Maintenance of blood pressure at goal levels.    Lipids Yes    Intervention Provide education and support for participant on nutrition & aerobic/resistive exercise along with prescribed medications to achieve LDL <70mg24mL >40mg.63mExpected  Outcomes Short Term: Participant states understanding of desired cholesterol values and is compliant with medications prescribed. Participant is following exercise prescription and nutrition guidelines.;Long Term: Cholesterol controlled with medications as prescribed, with individualized exercise RX and with personalized nutrition plan. Value goals: LDL < 63m, HDL > 40 mg.    Stress Yes    Intervention Offer individual and/or small group education and counseling on adjustment to heart disease, stress management and health-related lifestyle change. Teach and support self-help strategies.;Refer participants experiencing significant psychosocial distress to appropriate mental health specialists for further evaluation and treatment. When possible, include family members and significant others in education/counseling sessions.    Expected Outcomes Short Term: Participant demonstrates changes in health-related behavior, relaxation and other stress management skills, ability to obtain effective social support, and compliance with psychotropic medications if prescribed.;Long  Term: Emotional wellbeing is indicated by absence of clinically significant psychosocial distress or social isolation.              Personal Goals Discharge:  Goals and Risk Factor Review     Row Name 04/03/22 1346 04/25/22 1510 05/23/22 1153 06/20/22 1633       Core Components/Risk Factors/Patient Goals Review   Personal Goals Review Weight Management/Obesity;Stress;Hypertension;Lipids;Diabetes Weight Management/Obesity;Stress;Hypertension;Lipids;Diabetes Weight Management/Obesity;Stress;Hypertension;Lipids;Diabetes Weight Management/Obesity;Stress;Hypertension;Lipids;Diabetes    Review DNewmanstarted intensive cardiac rehab on 04/03/22. DWaunita Schoonerdid well with exercise, vital signs, oxygen saturations and CBG's were stable DMassiahhas been doing  well with exercise, vital signs, oxygen saturations and CBG's were stable. Dave's oxygen has been decreased to 1 liter/min per Dr JMartiniqueand pulmonary. Oxygen saturations have reamined in the low to mid 90's. DWaunita Schoonerreports having more energy since participating in the program DRaficontinues to do  well with exercise, vital signs, oxygen saturations and CBG's have been stable. Dave's oxygen has been decreased to 1 liter/min per Dr JMartiniqueand pulmonary. Oxygen saturations have reamined in the low to mid 90's. DWaunita Schoonersays participating in intensive cardiac rehab has been helpful for him. DWaunita Schoonerwill complete intensive cardiac rehab on 05/26/22. DShubhcontinues to do  well with exercise, vital signs, oxygen saturations and CBG's have been stable. Dave's oxygen has been decreased to 1 liter/min per Dr JMartiniqueand pulmonary. Oxygen saturations have reamined in the low to mid 90's. DWaunita Schoonersays participating in intensive cardiac rehab has been helpful for him. DWaunita Schoonerwill complete intensive cardiac rehab on 06/21/22    Expected Outcomes DKenlinwill continue to participate in intensive cardiac rehab for exercise, nutrition, and lifestyle modifications DTanvirwill continue to  participate in intensive cardiac rehab for exercise, nutrition, and lifestyle modifications DOrbanwill continue to participate in intensive cardiac rehab for exercise, nutrition, and lifestyle modifications DUmerwill continue to participate in intensive cardiac rehab for exercise, nutrition, and lifestyle modifications             Exercise Goals and Review:  Exercise Goals     Row Name 03/30/22 1205             Exercise Goals   Increase Physical Activity Yes       Intervention Provide advice, education, support and counseling about physical activity/exercise needs.;Develop an individualized exercise prescription for aerobic and resistive training based on initial evaluation findings, risk stratification, comorbidities and participant's personal goals.       Expected Outcomes Short Term: Attend rehab on a regular basis to increase amount of physical activity.;Long Term: Add in home exercise to make exercise part of routine and to increase amount of physical activity.;Long  Term: Exercising regularly at least 3-5 days a week.       Increase Strength and Stamina Yes       Intervention Provide advice, education, support and counseling about physical activity/exercise needs.;Develop an individualized exercise prescription for aerobic and resistive training based on initial evaluation findings, risk stratification, comorbidities and participant's personal goals.       Expected Outcomes Short Term: Increase workloads from initial exercise prescription for resistance, speed, and METs.;Short Term: Perform resistance training exercises routinely during rehab and add in resistance training at home;Long Term: Improve cardiorespiratory fitness, muscular endurance and strength as measured by increased METs and functional capacity (6MWT)       Able to understand and use rate of perceived exertion (RPE) scale Yes       Intervention Provide education and explanation on how to use RPE scale       Expected  Outcomes Short Term: Able to use RPE daily in rehab to express subjective intensity level;Long Term:  Able to use RPE to guide intensity level when exercising independently       Knowledge and understanding of Target Heart Rate Range (THRR) Yes       Intervention Provide education and explanation of THRR including how the numbers were predicted and where they are located for reference       Expected Outcomes Short Term: Able to state/look up THRR;Short Term: Able to use daily as guideline for intensity in rehab;Long Term: Able to use THRR to govern intensity when exercising independently       Understanding of Exercise Prescription Yes       Intervention Provide education, explanation, and written materials on patient's individual exercise prescription       Expected Outcomes Short Term: Able to explain program exercise prescription;Long Term: Able to explain home exercise prescription to exercise independently                Exercise Goals Re-Evaluation:  Exercise Goals Re-Evaluation     Row Name 04/03/22 1505 05/05/22 1426 05/26/22 1630 06/21/22 1445       Exercise Goal Re-Evaluation   Exercise Goals Review Increase Physical Activity;Increase Strength and Stamina;Able to understand and use rate of perceived exertion (RPE) scale;Knowledge and understanding of Target Heart Rate Range (THRR);Understanding of Exercise Prescription Increase Physical Activity;Increase Strength and Stamina;Able to understand and use rate of perceived exertion (RPE) scale;Knowledge and understanding of Target Heart Rate Range (THRR);Understanding of Exercise Prescription Increase Physical Activity;Increase Strength and Stamina;Able to understand and use rate of perceived exertion (RPE) scale;Knowledge and understanding of Target Heart Rate Range (THRR);Understanding of Exercise Prescription Increase Physical Activity;Increase Strength and Stamina;Able to understand and use rate of perceived exertion (RPE)  scale;Knowledge and understanding of Target Heart Rate Range (THRR);Understanding of Exercise Prescription    Comments Pt's first day in the CRP2 program. Pt understands the exercise Rx, RPE scale, and THRR> Reviewed METs and goals. Pt voices improvement in upperbody strength, which was a goal. Pt also voices improved stamina. Reviewed METs and goals. Pt continues to voice improvement in upperbody strength, which was a goal. Pt also voices improved stamina. Pt will be extended to complete his exercise sessions. Pt graduated form the [place] program today. Pt had peak METs of 2.5. Pt voices that he feels much stonger and plans to join [employer] this week to continue his exercise. 4-5x/week 30+ minutes encouraged for a totoal of 150 minutes per week.    Expected Outcomes Will continue to monitor patients and progress  exercise workloads as tolerated. Will continue to monitor patients and progress exercise workloads as tolerated. Will continue to monitor patients and progress exercise workloads as tolerated. Pt will continue to exercise at a gym near his home.             Nutrition & Weight - Outcomes:  Pre Biometrics - 03/30/22 0930       Pre Biometrics   Waist Circumference 45.5 inches    Hip Circumference 41.25 inches    Waist to Hip Ratio 1.1 %    Triceps Skinfold 10 mm    % Body Fat 29.5 %    Grip Strength 24 kg    Flexibility --   No performed due to back issues   Single Leg Stand 2.43 seconds             Post Biometrics - 06/02/22 1230        Post  Biometrics   Height 5' 8.5" (1.74 m)    Weight 89 kg    Waist Circumference 46 inches    Hip Circumference 41.5 inches    Waist to Hip Ratio 1.11 %    BMI (Calculated) 29.4    Triceps Skinfold 10 mm    % Body Fat 29.7 %    Grip Strength 24 kg    Flexibility --   Not done   Single Leg Stand 4.06 seconds             Nutrition:  Nutrition Therapy & Goals - 06/02/22 1346       Nutrition Therapy   Diet Heart  Healthy/Carbohydrate Consistent diet    Drug/Food Interactions Statins/Certain Fruits      Personal Nutrition Goals   Comments Goals in progress. He continues to attend the [employer] education series and reports improved knowledge of heart healthy diet. Azriel reports making an effort to reduce portion sizes at meals. He does continue to eat out often but he is trying to make more nutrient dense choices such as increased vegetables and reduced fried foods. I suspect sodium intake remains >153m/day due to eating out frequently. 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 DJaiveerwill continue to benefit from adherance to the [employer] eating plan and participation in [place]      Intervention Plan   Intervention Prescribe, educate and counsel regarding individualized specific dietary modifications aiming towards targeted core components such as weight, hypertension, lipid management, diabetes, heart failure and other comorbidities.;Nutrition handout(s) given to patient.    Expected Outcomes Short Term Goal: Understand basic principles of dietary content, such as calories, fat, sodium, cholesterol and nutrients.;Long Term Goal: Adherence to prescribed nutrition plan.             Nutrition Discharge:  Nutrition Assessments - 06/12/22 0907       Rate Your Plate Scores   Post Score 65             Education Questionnaire Score:  Knowledge Questionnaire Score - 06/09/22 1425       Knowledge Questionnaire Score   Post Score 21/24             Goals reviewed with patient; copy given to patient.Pt graduates from  Intensive/Traditional cardiac rehab program today with completion of  51 exercise and education sessions. Pt  maintained good attendance and progressed nicely  during their participation in rehab as evidenced by increased MET level.   Medication list reconciled. Repeat  PHQ score- 0 .  Pt has made significant lifestyle changes and should be commended for their success.Waunita Schooner achieved their goals during cardiac rehab.   Pt plans to continue exercise at [facility]. Zain increased his distance on his post exercise walk test by 689 feet. Waunita Schooner was able to decrease his oxygen to 1l/min. We are proud of Dave's progress!Harrell Gave RN BSN

## 2022-06-26 ENCOUNTER — Other Ambulatory Visit: Payer: Self-pay | Admitting: *Deleted

## 2022-06-26 DIAGNOSIS — I714 Abdominal aortic aneurysm, without rupture, unspecified: Secondary | ICD-10-CM

## 2022-06-26 NOTE — Progress Notes (Signed)
  Care Coordination Note  06/26/2022 Name: [name] MRN: [id number] DOB: 1940-06-05  Jeremiah [name] is a 82 y.o. year old male who is a primary care patient of Perini, Elta Guadeloupe, MD and is actively engaged with the care management team. I reached out to [name] by phone today to assist with re-scheduling a follow up visit with the RN Case Manager  Follow up plan: Unsuccessful telephone outreach attempt made. A HIPAA compliant phone message was left for the patient providing contact information and requesting a return call.  We have been unable to make contact with the patient for follow up. The care management team is available to follow up with the patient after provider conversation with the patient regarding recommendation for care management engagement and subsequent re-referral to the care management team.   [place]  Direct Dial: [phone]

## 2022-06-28 ENCOUNTER — Telehealth: Payer: Self-pay | Admitting: *Deleted

## 2022-06-28 NOTE — Progress Notes (Signed)
  Care Coordination Note  06/28/2022 Name: Jeremiah [name] MRN: [id number] DOB: [birth year]  Jeremiah [name] is a 82 y.o. year old male who is a primary care patient of Perini, Elta Guadel[place], MD and is actively engaged with the care management team. I reached out to [name] by phone today to assist with re-scheduling a follow up visit with the RN Case Manager  Follow up plan: Telephone appointment with care management team member scheduled for:07/26/22  [place]  Direct Dial: [phone]

## 2022-06-29 DIAGNOSIS — R5383 Other fatigue: Secondary | ICD-10-CM | POA: Diagnosis not present

## 2022-06-29 DIAGNOSIS — U071 COVID-19: Secondary | ICD-10-CM | POA: Diagnosis not present

## 2022-06-29 DIAGNOSIS — Z1152 Encounter for screening for COVID-19: Secondary | ICD-10-CM | POA: Diagnosis not present

## 2022-06-29 DIAGNOSIS — J9620 Acute and chronic respiratory failure, unspecified whether with hypoxia or hypercapnia: Secondary | ICD-10-CM | POA: Diagnosis not present

## 2022-06-29 DIAGNOSIS — R0981 Nasal congestion: Secondary | ICD-10-CM | POA: Diagnosis not present

## 2022-06-29 DIAGNOSIS — R058 Other specified cough: Secondary | ICD-10-CM | POA: Diagnosis not present

## 2022-06-29 DIAGNOSIS — R6883 Chills (without fever): Secondary | ICD-10-CM | POA: Diagnosis not present

## 2022-07-06 ENCOUNTER — Ambulatory Visit (HOSPITAL_COMMUNITY)
Admission: RE | Admit: 2022-07-06 | Discharge: 2022-07-06 | Disposition: A | Discharge status: Home / Self Care | Payer: Medicare Other | Source: Ambulatory Visit | Attending: Vascular Surgery | Admitting: Vascular Surgery

## 2022-07-06 DIAGNOSIS — I714 Abdominal aortic aneurysm, without rupture, unspecified: Secondary | ICD-10-CM | POA: Diagnosis not present

## 2022-07-11 ENCOUNTER — Encounter: Payer: Self-pay | Admitting: Vascular Surgery

## 2022-07-11 ENCOUNTER — Ambulatory Visit: Payer: Medicare Other | Admitting: Vascular Surgery

## 2022-07-11 VITALS — BP 137/86 | HR 78 | Temp 98.2°F | Resp 18 | Ht 70.0 in | Wt 188.0 lb

## 2022-07-11 DIAGNOSIS — J9601 Acute respiratory failure with hypoxia: Secondary | ICD-10-CM | POA: Diagnosis not present

## 2022-07-11 DIAGNOSIS — I7143 Infrarenal abdominal aortic aneurysm, without rupture: Secondary | ICD-10-CM | POA: Diagnosis not present

## 2022-07-11 NOTE — Progress Notes (Signed)
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  No new concerns today.  No new abdominal or back pain.  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   S/P radiation therapy 04/23/13 - 05/29/14   Prostate/seminal vesicles, external beam 4500 cGy in 25 sessions   Seasonal allergies    Skin cancer    scalp   Small bowel obstruction ([place]) 06/10/2016   Wears glasses     Past Surgical History:  Procedure Laterality Date   BACK SURGERY  2009   CARDIAC CATHETERIZATION     COLONOSCOPY W/ BIOPSIES AND POLYPECTOMY     CORONARY STENT INTERVENTION N/A 01/24/2022   Procedure: CORONARY STENT INTERVENTION;  Surgeon: [name], [name], MD;  Location: [facility] [facility];  Service: Cardiovascular;  Laterality: N/A;   CYSTOSCOPY W/ URETERAL STENT PLACEMENT Left 05/12/2021   Procedure: CYSTOSCOPY WITH RETROGRADE PYELOGRAM/ LEFT URETERAL STENT PLACEMENT.;  Surgeon: [name], MD;  Location: [facility];  Service: Urology;  Laterality: Left;   Jeremiah [name]   LEFT HEART CATH AND CORONARY ANGIOGRAPHY N/A 03/14/2019   Procedure: LEFT HEART CATH AND CORONARY ANGIOGRAPHY;  Surgeon: [name], MD;  Location: [facility];  Service: Cardiovascular;  Laterality: N/A;   LEFT HEART CATH AND CORONARY ANGIOGRAPHY N/A  01/24/2022   Procedure: LEFT HEART CATH AND CORONARY ANGIOGRAPHY;  Surgeon: [name], [name], MD;  Location: [facility];  Service: Cardiovascular;  Laterality: N/A;   PROSTATE BIOPSY  2013, 2014, 01/2014   Gleason 7   RADIOACTIVE SEED IMPLANT N/A 07/01/2014   Procedure: RADIOACTIVE SEED IMPLANT;  Surgeon: [name], MD;  Location: [facility];  Service: Urology;  Laterality: N/A;  DR PORTABLE   TONSILLECTOMY     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   Procedure: SHUNT INSERTION VENTRICULAR-PERITONEAL;  Surgeon: [name], MD;  Location: [place];  Service: Neurosurgery;  Laterality: N/A;  SHUNT INSERTION VENTRICULAR-PERITONEAL    Family History  Problem Relation Age of Onset   Heart attack Father 76   Cancer Father        prostate   Diabetes Father    Cancer Paternal Uncle        prostate   Dementia Mother 47   Lung cancer Brother     SOCIAL HISTORY: Social History   Socioeconomic History   Marital status: Married    Spouse name: Not on file   Number of children: 2   Years of education: 13   Highest education level: Some college, no degree  Occupational History   Occupation: retired    Comment: all Naval architect  Tobacco Use   Smoking status: Former    Packs/day: 1.00  Types: Cigarettes    Quit date: 03/10/1961    Years since quitting: 61.3   Smokeless tobacco: Never   Tobacco comments:    Pt states he quit smoking 60 plus years ago and he remembers smoking maybe a pack a day. ALS 04/20/22  Vaping Use   Vaping Use: Never used  Substance and Sexual Activity   Alcohol use: Not Currently    Alcohol/week: 1.0 standard drink of alcohol    Types: 1 Glasses of wine per week    Comment: rare alcohol   Drug use: No   Sexual activity: Not on file  Other Topics Concern   Not on file  Social History Narrative   Rare caffeine use    Social Determinants of Health   Financial Resource Strain: Not on file  Food Insecurity: No Food  Insecurity (02/06/2022)   Hunger Vital Sign    Worried About Running Out of Food in the Last Year: Never true    Ran Out of Food in the Last Year: Never true  Transportation Needs: No Transportation Needs (02/13/2022)   PRAPARE - Hydrologist (Medical): No    Lack of Transportation (Non-Medical): No  Physical Activity: Unknown (06/13/2018)   Exercise Vital Sign    Days of Exercise per Week: Patient refused    Minutes of Exercise per Session: Patient refused  Stress: No Stress Concern Present (06/13/2018)   [place]    Feeling of Stress : Only a little  Social Connections: Unknown (06/13/2018)   Social Connection and Isolation Panel [NHANES]    Frequency of Communication with Friends and Family: More than three times a week    Frequency of Social Gatherings with Friends and Family: More than three times a week    Attends Religious Services: Not on file    Active Member of Clubs or Organizations: Not on file    Attends Archivist Meetings: Not on file    Marital Status: Not on file  Intimate Partner Violence: Not At Risk (01/28/2022)   Humiliation, Afraid, Rape, and Kick questionnaire    Fear of Current or Ex-Partner: No    Emotionally Abused: No    Physically Abused: No    Sexually Abused: No    Allergies  Allergen Reactions   Lipitor [Atorvastatin] Other (See Comments)    Hip pain    Current Outpatient Medications  Medication Sig Dispense Refill   acetaminophen (TYLENOL) 500 MG tablet Take 1 tablet (500 mg total) by mouth every 6 (six) hours as needed for mild pain or moderate pain. 30 tablet 0   aspirin EC 81 MG tablet Take 81 mg by mouth daily.      carvedilol (COREG) 12.5 MG tablet Take 1 tablet (12.5 mg total) by mouth 2 (two) times daily. 180 tablet 1   cetirizine (ZYRTEC) 10 MG tablet Take 10 mg by mouth daily.     cholecalciferol (VITAMIN D3) 25 MCG (1000 UNIT) tablet  Take 1,000 Units by mouth daily.     clopidogrel (PLAVIX) 75 MG tablet Take 1 tablet (75 mg total) by mouth daily with breakfast. 90 tablet 2   Cyanocobalamin (B-12 PO) Take by mouth.     dapagliflozin propanediol (FARXIGA) 10 MG TABS tablet Take 1 tablet (10 mg total) by mouth daily before breakfast. 30 tablet 11   ezetimibe (ZETIA) 10 MG tablet Take 10 mg by mouth daily.     meclizine (ANTIVERT) 25 MG  tablet Take 1 tablet (25 mg total) by mouth 3 (three) times daily as needed for dizziness. 30 tablet 0   Melatonin 10 MG TABS Take 10 mg by mouth at bedtime as needed (sleep).     metFORMIN (GLUCOPHAGE-XR) 500 MG 24 hr tablet Take 500 mg by mouth daily.     pantoprazole (PROTONIX) 20 MG tablet Take 20 mg by mouth daily before breakfast.      rosuvastatin (CRESTOR) 10 MG tablet Take 1 tablet (10 mg total) by mouth daily. 30 tablet 11   vitamin B-12 (CYANOCOBALAMIN) 500 MCG tablet Take 500 mcg by mouth daily.     No current facility-administered medications for this visit.    REVIEW OF SYSTEMS:  [X]$  denotes positive finding, [ ]$  denotes negative finding Cardiac  Comments:  Chest pain or chest pressure:    Shortness of breath upon exertion:    Short of breath when lying flat:    Irregular heart rhythm:        Vascular    Pain in calf, thigh, or hip brought on by ambulation:    Pain in feet at night that wakes you up from your sleep:     Blood clot in your veins:    Leg swelling:         Pulmonary    Oxygen at home:    Productive cough:     Wheezing:         Neurologic    Sudden weakness in arms or legs:     Sudden numbness in arms or legs:     Sudden onset of difficulty speaking or slurred speech:    Temporary loss of vision in one eye:     Problems with dizziness:         Gastrointestinal    Blood in stool:     Vomited blood:         Genitourinary    Burning when urinating:     Blood in urine:        Psychiatric    Major depression:         Hematologic    Bleeding  problems:    Problems with blood clotting too easily:        Skin    Rashes or ulcers:        Constitutional    Fever or chills:      PHYSICAL EXAM: Vitals:   07/11/22 1141  BP: 137/86  Pulse: 78  Resp: 18  Temp: 98.2 F (36.8 C)  TempSrc: Temporal  SpO2: 90%  Weight: 188 lb (85.3 kg)  Height: 5' 10"$  (1.778 m)    GENERAL: The patient is a well-nourished male, in no acute distress. The vital signs are documented above. CARDIAC: There is a regular rate and rhythm.  VASCULAR:  2+ femoral pulse palpable bilaterally 2+ PT pulse palpable bilaterally PULMONARY: No respiratory distress ABDOMEN: Soft and non-tender. No tenderness with deep palpation in midline MUSCULOSKELETAL: There are no major deformities or cyanosis. NEUROLOGIC: No focal weakness or paresthesias are detected. SKIN: There are no ulcers or rashes noted. PSYCHIATRIC: The patient has a normal affect.  DATA:   AAA duplex today shows abdominal aneurysm measures 4.1 cm (previously 4.2 cm)   Assessment/Plan:  82 year old male who presents for 1 year surveillance of a 4.2 cm abdominal aortic aneurysm.  Discussed no significant interval change in his aneurysm size over the last year.  This appears stable around 4.2 cm.  I will see him again in  1 year with duplex for continued surveillance.  I discussed repair at greater than 5.5 cm in men.  Marty Heck, MD [facility] Office: [place]

## 2022-07-11 NOTE — Progress Notes (Signed)
Cardiology Office Note:    Date:  07/19/2022   ID:  KEMARRI ARMENTROUT, DOB 02/10/1941, MRN [id number]  PCP:  Rodrigo Ran, MD   [facility] Providers Cardiologist:  Byran Bilotti [place], MD     Referring MD: Rodrigo Ran, MD   Chief Complaint  Patient presents with   Coronary Artery Disease    History of Present Illness:    Jeremiah [name] is a 82 y.o. male with a hx of normal pressure hydrocephalus s/p VP shunt, chronic respiratory failure on home O2, hypertension, hyperlipidemia, and DM2.    Patient was seen in the hospital on 01/21/2022 for evaluation of chest pain.  Troponin was mildly elevated, however patient had uncontrolled blood pressure in the 190s.  Troponin eventually peaked at 823 before trending back down.  Echocardiogram on 01/21/2022 showed EF 55 to 60%, hypokinesis of the mid RV with relative apical sparing, mild MR, mild to moderate AI, mildly dilated aortic root.  Subsequent cardiac catheterization performed on 01/24/2022 showed 50% proximal LAD, 50% D1, 15% proximal to mid RCA lesion, 90% RPDA lesion treated with Onyx frontier 2.5 x 12 mm DES.  Due to history of intolerance of Lipitor in the past, it was recommended to consider PCSK9 inhibitor as outpatient.  He was discharged on aspirin and Plavix.  Carvedilol increased to 12.5 mg twice a day.  Unfortunately patient returned back to the hospital 2 days after he was discharged with shortness of breath, generalized weakness, lethargy and a nonproductive cough.  O2 saturation was in the 80s on home 2 L oxygen.  CTA negative for PE but showed bilateral lower lobe infiltrate.  He was started on IV antibiotic for hospital acquired pneumonia.   Patient was treated for acute on chronic hypoxic respiratory failure of unclear etiology.  On 9/12, patient developed acute encephalopathy of unclear etiology.  Code stroke was called, extensive imaging was done revealing, EEG showed nonspecific abnormality.  Neurology felt patient had cefepime  induced encephalopathy.  He was seen recently by Dr Chestine Spore for follow up AAA which was stable at 4.2 cm.   On follow up today he is doing well. States breathing is OK. Not been able to use CPAP but does use oxygen at home. Had sleep study a few days ago. Results pending. Denies any new SOB, chest pain, palpitations or edema. Doesn't really know his medications but notes he has a CNA come twice a week and they help with meds.    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   S/P radiation therapy 04/23/13 - 05/29/14   Prostate/seminal vesicles, external beam 4500 cGy in 25 sessions   Seasonal allergies    Skin cancer    scalp   Small bowel obstruction ([facility]) 06/10/2016   Wears glasses     Past Surgical History:  Procedure Laterality Date   BACK SURGERY  2009   CARDIAC CATHETERIZATION     COLONOSCOPY W/ BIOPSIES AND POLYPECTOMY     CORONARY STENT INTERVENTION N/A 01/24/2022   Procedure: CORONARY STENT INTERVENTION;  Surgeon:  Swaziland, Sheridan Hew M, MD;  Location: [facility];  Service: Cardiovascular;  Laterality: N/A;   CYSTOSCOPY W/ URETERAL STENT PLACEMENT Left 05/12/2021   Procedure: CYSTOSCOPY WITH RETROGRADE PYELOGRAM/ LEFT URETERAL STENT PLACEMENT.;  Surgeon: Jannifer Hick, MD;  Location: [facility];  Service: Urology;  Laterality: Left;   HYDROCELE EXCISION  1963   LEFT HEART CATH AND CORONARY ANGIOGRAPHY N/A 03/14/2019   Procedure: LEFT HEART CATH AND CORONARY ANGIOGRAPHY;  Surgeon: Kathleene Hazel, MD;  Location: [facility];  Service: Cardiovascular;  Laterality: N/A;   LEFT HEART CATH AND CORONARY ANGIOGRAPHY N/A  01/24/2022   Procedure: LEFT HEART CATH AND CORONARY ANGIOGRAPHY;  Surgeon: Swaziland, Galaxy Borden M, MD;  Location: [facility] INVASIVE [facility];  Service: Cardiovascular;  Laterality: N/A;   PROSTATE BIOPSY  2013, 2014, 01/2014   Gleason 7   RADIOACTIVE SEED IMPLANT N/A 07/01/2014   Procedure: RADIOACTIVE SEED IMPLANT;  Surgeon: Valetta Fuller, MD;  Location: [facility];  Service: Urology;  Laterality: N/A;  DR PORTABLE   TONSILLECTOMY     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   Procedure: SHUNT INSERTION VENTRICULAR-PERITONEAL;  Surgeon: Tia Alert, MD;  Location: [facility] OR;  Service: Neurosurgery;  Laterality: N/A;  SHUNT INSERTION VENTRICULAR-PERITONEAL    Current Medications: Current Meds  Medication Sig   acetaminophen (TYLENOL) 500 MG tablet Take 1 tablet (500 mg total) by mouth every 6 (six) hours as needed for mild pain or moderate pain.   aspirin EC 81 MG tablet Take 81 mg by mouth daily.    carvedilol (COREG) 12.5 MG tablet Take 1 tablet (12.5 mg total) by mouth 2 (two) times daily.   cetirizine (ZYRTEC) 10 MG tablet Take 10 mg by mouth daily.   cholecalciferol (VITAMIN D3) 25 MCG (1000 UNIT) tablet Take 1,000 Units by mouth daily.   clopidogrel (PLAVIX) 75 MG tablet Take 1 tablet (75 mg total) by mouth daily with breakfast.   Cyanocobalamin (B-12 PO) Take by mouth.   dapagliflozin propanediol (FARXIGA) 10 MG TABS tablet Take 1 tablet (10 mg total) by mouth daily before breakfast.   ezetimibe (ZETIA) 10 MG tablet Take 10 mg by mouth daily.   meclizine (ANTIVERT) 25 MG tablet Take 1 tablet (25 mg total) by mouth 3 (three) times daily as needed for dizziness.   Melatonin 10 MG TABS Take 10 mg by mouth at bedtime as needed (sleep).   metFORMIN (GLUCOPHAGE-XR) 500 MG 24 hr tablet Take 500 mg by mouth daily.   pantoprazole (PROTONIX) 20 MG tablet Take 20 mg by mouth daily before breakfast.    rosuvastatin (CRESTOR) 10 MG tablet Take 1 tablet (10 mg total) by mouth daily.   vitamin  B-12 (CYANOCOBALAMIN) 500 MCG tablet Take 500 mcg by mouth daily.     Allergies:   Lipitor [atorvastatin]   Social History   Socioeconomic History   Marital status: Married    Spouse name: Not on file   Number of children: 2   Years of education: 13   Highest education level: Some college, no degree  Occupational History   Occupation: retired    Comment: all Equities trader  Tobacco Use   Smoking status: Former    Packs/day: 1.00    Types: Cigarettes    Quit date: 03/10/1961    Years since quitting: 61.4   Smokeless tobacco: Never   Tobacco comments:    Pt states he quit smoking 60 plus years ago and he remembers smoking maybe a pack a  day. ALS 04/20/22  Vaping Use   Vaping Use: Never used  Substance and Sexual Activity   Alcohol use: Not Currently    Alcohol/week: 1.0 standard drink of alcohol    Types: 1 Glasses of wine per week    Comment: rare alcohol   Drug use: No   Sexual activity: Not on file  Other Topics Concern   Not on file  Social History Narrative   Rare caffeine use    Social Determinants of Health   Financial Resource Strain: Not on file  Food Insecurity: No Food Insecurity (02/06/2022)   Hunger Vital Sign    Worried About Running Out of Food in the Last Year: Never true    Ran Out of Food in the Last Year: Never true  Transportation Needs: No Transportation Needs (02/13/2022)   PRAPARE - Administrator, Civil Service (Medical): No    Lack of Transportation (Non-Medical): No  Physical Activity: Unknown (06/13/2018)   Exercise Vital Sign    Days of Exercise per Week: Patient refused    Minutes of Exercise per Session: Patient refused  Stress: No Stress Concern Present (06/13/2018)   [employer] of Occupational Health - Occupational Stress Questionnaire    Feeling of Stress : Only a little  Social Connections: Unknown (06/13/2018)   Social Connection and Isolation Panel [NHANES]    Frequency of Communication with Friends and Family:  More than three times a week    Frequency of Social Gatherings with Friends and Family: More than three times a week    Attends Religious Services: Not on Marketing executive or Organizations: Not on file    Attends Banker Meetings: Not on file    Marital Status: Not on file     Family History: The patient's family history includes Cancer in his father and paternal uncle; Dementia (age of onset: 34) in his mother; Diabetes in his father; Heart attack (age of onset: 68) in his father; Lung cancer in his brother.  ROS:   Please see the history of present illness.     All other systems reviewed and are negative.  EKGs/Labs/Other Studies Reviewed:    The following studies were reviewed today:  Echo 01/21/2022  1. Left ventricular ejection fraction, by estimation, is 55 to 60%. The  left ventricle has normal function. Left ventricular endocardial border  not optimally defined to evaluate regional wall motion. Left ventricular  diastolic parameters are  indeterminate.   2. Right ventricular systolic function is mildly reduced with hypokinesis  of the mid ventricle and relative apical sparing. The right ventricular  size is mildly enlarged. IVC not assessed, cannot calculate RVSP.   3. Dilated coronary sinus.   4. The mitral valve is normal in structure. Mild mitral valve  regurgitation. No evidence of mitral stenosis.   5. The aortic valve is tricuspid. Aortic valve regurgitation is mild to  moderate. No aortic stenosis is present.   6. Aortic dilatation noted. There is mild dilatation of the aortic root,  measuring 42 mm.    Cath 01/24/2022   Prox LAD lesion is 50% stenosed.   1st Diag lesion is 50% stenosed.   Prox RCA to Mid RCA lesion is 15% stenosed.   RPDA lesion is 90% stenosed.   A drug-eluting stent was successfully placed using a STENT ONYX FRONTIER 2.5X12.   Post intervention, there is a 0% residual stenosis.   LV end diastolic pressure is  normal.  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 Anticipate DC in am   EKG:  EKG is not ordered today.    Recent Labs: 08/12/2021: B Natriuretic Peptide 40.1 02/01/2022: BUN 28; Creatinine, Ser 1.28; Hemoglobin 14.4; Magnesium 1.9; Platelets 179; Potassium 4.2; Sodium 145 03/14/2022: ALT 6  Recent Lipid Panel    Component Value Date/Time   CHOL 140 03/14/2022 1113   TRIG 181 (H) 03/14/2022 1113   HDL 44 03/14/2022 1113   CHOLHDL 3.2 03/14/2022 1113   CHOLHDL 4.8 01/21/2022 0200   VLDL 53 (H) 01/21/2022 0200   LDLCALC 66 03/14/2022 1113   Dated 05/30/22: cholesterol 135, triglycerides 189. HDL 43, LDL 54. A1c 6.9%.   Risk Assessment/Calculations:           Physical Exam:    VS:  BP 110/68   Pulse 88   Ht 5\' 10"  (1.778 m)   Wt 188 lb (85.3 kg)   SpO2 92%   BMI 26.98 kg/m        Wt Readings from Last 3 Encounters:  07/19/22 188 lb (85.3 kg)  07/11/22 188 lb (85.3 kg)  06/02/22 196 lb 3.4 oz (89 kg)     GEN:  Well nourished, well developed in no acute distress HEENT: Normal NECK: No JVD; No carotid bruits LYMPHATICS: No lymphadenopathy CARDIAC: RRR, no murmurs, rubs, gallops RESPIRATORY:  Clear to auscultation without rales, wheezing or rhonchi  ABDOMEN: Soft, non-tender, non-distended MUSCULOSKELETAL:  No edema; No deformity  SKIN: Warm and dry NEUROLOGIC:  Alert and oriented x 3 PSYCHIATRIC:  Normal affect   ASSESSMENT:    1. Coronary artery disease involving native coronary artery of native heart without angina pectoris   2. Hyperlipidemia LDL goal <70   3. Abdominal aortic aneurysm (AAA) without rupture, unspecified part (HCC)   4. Primary hypertension   5. Chronic respiratory failure with hypoxia, on home O2 therapy (HCC) - 2 L/min prn per patient     PLAN:    In order of problems listed above:  CAD s/p stenting with DES to RPDA with 2.5 x 12 mm  Onyx frontier drug-eluting stent on 01/24/2022.  He has no significant angina. Plan DAPT for one year.  Plan follow up in 6 months.  Hyperlipidemia: Continue on Crestor.  LDL is at goal 54. On crestor.   Hypertension: Blood pressure is well controlled.   Chronic respiratory failure: On home oxygen. Sleep study pending  5.   DM last A1c 6.9%. on Farxiga and metformin.           Medication Adjustments/Labs and Tests Ordered: Current medicines are reviewed at length with the patient today.  Concerns regarding medicines are outlined above.  No orders of the defined types were placed in this encounter.  No orders of the defined types were placed in this encounter.   There are no Patient Instructions on file for this visit.   Signed, Torrin Crihfield [place], MD  07/19/2022 8:34 AM    [facility]

## 2022-07-14 DIAGNOSIS — J9601 Acute respiratory failure with hypoxia: Secondary | ICD-10-CM | POA: Diagnosis not present

## 2022-07-16 ENCOUNTER — Ambulatory Visit (INDEPENDENT_AMBULATORY_CARE_PROVIDER_SITE_OTHER): Payer: Medicare Other | Admitting: Neurology

## 2022-07-16 ENCOUNTER — Ambulatory Visit (HOSPITAL_COMMUNITY)
Admission: EM | Admit: 2022-07-16 | Discharge: 2022-07-16 | Disposition: A | Discharge status: Home / Self Care | Payer: Medicare Other

## 2022-07-16 DIAGNOSIS — Z9981 Dependence on supplemental oxygen: Secondary | ICD-10-CM

## 2022-07-16 DIAGNOSIS — G4734 Idiopathic sleep related nonobstructive alveolar hypoventilation: Secondary | ICD-10-CM

## 2022-07-16 DIAGNOSIS — Z789 Other specified health status: Secondary | ICD-10-CM

## 2022-07-16 DIAGNOSIS — G4720 Circadian rhythm sleep disorder, unspecified type: Secondary | ICD-10-CM

## 2022-07-16 DIAGNOSIS — G4733 Obstructive sleep apnea (adult) (pediatric): Secondary | ICD-10-CM

## 2022-07-16 DIAGNOSIS — G4761 Periodic limb movement disorder: Secondary | ICD-10-CM

## 2022-07-16 DIAGNOSIS — Z955 Presence of coronary angioplasty implant and graft: Secondary | ICD-10-CM

## 2022-07-16 NOTE — ED Notes (Signed)
Left without being seen he thought he was at [address] for sleep study

## 2022-07-19 ENCOUNTER — Encounter: Payer: Self-pay | Admitting: Cardiology

## 2022-07-19 ENCOUNTER — Ambulatory Visit: Payer: Medicare Other | Attending: Cardiology | Admitting: Cardiology

## 2022-07-19 VITALS — BP 110/68 | HR 88 | Ht 70.0 in | Wt 188.0 lb

## 2022-07-19 DIAGNOSIS — E785 Hyperlipidemia, unspecified: Secondary | ICD-10-CM | POA: Diagnosis not present

## 2022-07-19 DIAGNOSIS — J9611 Chronic respiratory failure with hypoxia: Secondary | ICD-10-CM

## 2022-07-19 DIAGNOSIS — I2510 Atherosclerotic heart disease of native coronary artery without angina pectoris: Secondary | ICD-10-CM

## 2022-07-19 DIAGNOSIS — I10 Essential (primary) hypertension: Secondary | ICD-10-CM | POA: Diagnosis not present

## 2022-07-19 DIAGNOSIS — I714 Abdominal aortic aneurysm, without rupture, unspecified: Secondary | ICD-10-CM | POA: Diagnosis not present

## 2022-07-19 DIAGNOSIS — Z9981 Dependence on supplemental oxygen: Secondary | ICD-10-CM

## 2022-07-19 NOTE — Patient Instructions (Signed)
    Follow-Up: At [facility], you and your health needs are our priority.  As part of our continuing mission to provide you with exceptional heart care, we have created designated Provider Care Teams.  These Care Teams include your primary Cardiologist (physician) and Advanced Practice Providers (APPs -  Physician Assistants and Nurse Practitioners) who all work together to provide you with the care you need, when you need it.  We recommend signing up for the patient portal called "MyChart".  Sign up information is provided on this After Visit Summary.  MyChart is used to connect with patients for Virtual Visits (Telemedicine).  Patients are able to view lab/test results, encounter notes, upcoming appointments, etc.  Non-urgent messages can be sent to your provider as well.   To learn more about what you can do with MyChart, go to [email or web address].    Your next appointment:   6 month(s)  Provider:   Peter [name], MD

## 2022-07-20 NOTE — Procedures (Signed)
Physician Interpretation:     [facility] SPLIT NIGHT INTERPRETATION REPORT   STUDY DATE: 07/16/2022     PATIENT NAME:  Jeremiah [name], Jeremiah [name]         DATE OF BIRTH:  Dec 01, 1940  PATIENT ID:  [id number]    TYPE OF STUDY:  SPLIT  READING PHYSICIAN: Star Age, MD, PhD SCORING TECHNICIAN: Richard Miu, RPSGT     Referred by: Reginold Agent, NP  History and Indication for Testing: 82 year old right-handed gentleman with an underlying complex?medical history of hypertension, hyperlipidemia, BPH, central retinal artery occlusion, osteoarthritis, prostate cancer, status postradiation treatment, allergies, small bowel obstruction, diabetes, diverticulosis, kidney stones, tremor and gait disorder (seen by Dr. 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  Height: 70.0 in Weight: 192 lb (BMI 27) Neck Size: 16.5 in   Medications: Tylenol, Aspirin, Coreg, Zyrtec, Vitamin D3, Plavix, Vitamin B 12, Farxiga, Zetia, Antivert, Melatonin, Glucophage, Protonix, Crestor, Flomax  DESCRIPTION: A sleep technologist was in attendance for the duration of the recording.  Data collection, scoring, video monitoring, and reporting were performed in compliance with the AASM Manual for the Scoring of Sleep and Associated Events; (Hypopnea is scored based on the criteria listed in Section VIII D. 1b in the AASM Manual V2.6 using a 4% oxygen desaturation rule or Hypopnea is scored based on the criteria listed in Section VIII D. 1a in the AASM Manual V2.6 using 3% oxygen desaturation and /or arousal rule).  A physician certified by the American Board of Sleep Medicine reviewed each epoch of the study.  FINDINGS:  Please refer to the attached summary for additional quantitative information.  STUDY DETAILS:  Lights off was at 20:48: and lights on 04:58: (489 minutes hours in bed). This study was performed with an initial diagnostic portion followed by positive airway pressure titration.  DIAGNOSTIC ANALYSIS   SLEEP CONTINUITY AND SLEEP ARCHITECTURE:  The diagnostic portion of the study began at 20:48 and ended at 00:21, for a recording time of 3h 33.47mminutes.  Total sleep time was 124 minutes minutes (0.0% supine;  100.0% lateral;  0.0% prone, 0.0% REM sleep), with a decreased sleep efficiency at 58.2%. Sleep latency was normal at 24.5 minutes. REM sleep latency was decreased at 0.0 minutes.  Arousal index was 48.4 /hr. Of the total sleep time, the percentage of stage N1 sleep was 32.3%, stage N2 sleep was 49.2%, stage N3 sleep was 18.5%, and REM sleep was absent. There were 0 Stage R periods observed during this portion of the study, 29 awakenings (i.e. transitions to Stage W from any sleep stage), and 103.0 total stage transitions. Wake after sleep onset (WASO) time accounted for 64 minutes.   AROUSAL (Baseline): There were 83.0 arousals in total, for an arousal index of 40.2 arousals/hour.  Of these, 63.0 were identified as respiratory-related arousals (30.5 /hr), 0 were PLM-related arousals (0.0 /hr), and 38 were non-specific arousals (18.4 /hr)   RESPIRATORY MONITORING:  Based on CMS criteria (using a 4% oxygen desaturation rule for scoring hypopneas), there were 29 apneas (27 obstructive; 2 central; 0 mixed), and 124 hypopneas.  Apnea index was 13.1. Hypopnea index was 51.8. The apnea-hypopnea index was 64.8 overall (0.0 supine; 0.0 REM, 0.0 supine REM). There were 0 respiratory effort-related arousals (RERAs).  The RERA index was 0.0 events/hr. Total respiratory disturbance index (RDI) was 64.8 events/hr.  RDI results showed: supine RDI  0.0 /hr; non-supine RDI 64.8 /hr; REM RDI 0.0 /hr, supine REM RDI 0.0 /hr.  Based on AASM criteria (using a 3% oxygen desaturation and /or arousal rule for scoring  hypopneas), there were 29 apneas (27 obstructive; 2 central; 0 mixed), and 124 hypopneas.  Apnea index was 13.1. Hypopnea index was 52.3. The apnea-hypopnea index was 65.3 overall (0.0 supine; 0.0 REM, 0.0 supine REM). There were 0 respiratory effort-related arousals (RERAs). Total respiratory disturbance index (RDI) was 65.3 events/hr. RDI results showed: supine RDI 0.0 /hr; non-supine RDI 65.3 /hr; REM RDI 0.0 /hr, supine REM RDI 0.0 /hr.   Respiratory events were associated with oxyhemoglobin desaturations (nadir 73%) from a normal baseline (mean 90%). Total time spent at, or below 88% was 150.7 minutes, or 121.5%  of total sleep time.  There were 0.0 occurrences of Cheyne Stokes breathing.   LIMB MOVEMENTS: There were 0 periodic limb movements of sleep (0.0/hr), of which 0 (0.0/hr) were associated with an arousal.   OXIMETRY: Total sleep time spent at, or below 88% was 103.6 minutes, or 83.6% of total sleep time.    BODY POSITION: Duration of total sleep and percent of total sleep in their respective position is as follows: supine 00 minutes minutes (0.0%), non-supine 124.0 minutes (100.0%); right 60 minutes minutes (48.8%), left 63 minutes minutes (51.2%), and prone 00 minutes minutes (0.0%). Total supine REM sleep time was 00 minutes minutes (0.0% of total REM sleep).   Analysis of electrocardiogram activity showed the highest heart rate for the baseline portion of the study was 97.0 beats per minute.  The average heart rate during sleep was 82 bpm, while the highest heart rate for the same period was 91 bpm.    TREATMENT ANALYSIS  SLEEP CONTINUITY AND SLEEP ARCHITECTURE:  The treatment portion of the study began at 00:21 and ended at 04:58, for a recording time of 4h 36.61mminutes.  Total sleep time was 158 minutes minutes (0.0% supine;  100.0% lateral; 0.0% prone, 34.8% REM sleep), with a decreased sleep efficiency at 57.1%. Sleep latency was normal at 15.5 minutes. REM sleep latency was  decreased at 1.5 minutes.  Arousal index was 14.8 /hr. Of the total sleep time, the percentage of stage N1 sleep was 8.2%, stage N3 sleep was 5.7%, and REM sleep was 34.8%. There were 2 Stage R periods observed during this portion of the study, 7 awakenings (i.e. transitions to Stage W from any sleep stage), and 36.0 total stage transitions. Wake after sleep onset (WASO) time accounted for 103 minutes.   AROUSAL: There were 34.0 arousals in total, for an arousal index of 12.9 arousals/hour.  Of these, 0.0 were identified as respiratory-related arousals (0.0 /hr), 0 were PLM-related arousals (0.0 /hr), and 39 were non-specific arousals (14.8 /hr)  RESPIRATORY MONITORING:    While on PAP therapy, based on CMS criteria, the apnea-hypopnea index was 7.2 overall (0.0 supine; 5.3 REM).   While on PAP therapy, based on AASM criteria, the apnea-hypopnea index was 7.2 overall (0.0 supine; 5.3 REM).   Respiratory events were associated with oxyhemoglobin desaturation (nadir 73.0%) from a mean of 90.0%.  Total time spent at, or below 88% was 101.6 minutes, or 64.3%  of total sleep time.    LIMB MOVEMENTS: There were 213 periodic limb movements of sleep (80.9/hr), of which 0 (0.0/hr) were associated with an arousal.   OXIMETRY: Total sleep time spent at, or below 88% was 75.2 minutes, or 47.6% of total  sleep time. Snoring was classified as .   BODY POSITION: Duration of total sleep and percent of total sleep in their respective position is as follows: supine 00 minutes minutes (0.0%), non-supine 158.0 minutes (100.0%); right 00 minutes minutes (0.0%), left 158 minutes minutes (100.0%), and prone 00 minutes minutes (0.0%). Total supine REM sleep time was 00 minutes minutes (0.0% of total REM sleep).   Analysis of electrocardiogram activity showed the highest heart rate for the treatment portion of the study was 97.0 beats per minute. The average heart rate during sleep was 77 bpm, while the highest heart rate  for the same period was 86 bpm.    TITRATION DETAILS (SEE ALSO TABLE AT THE END OF THE REPORT):  The patient qualified for an emergency split sleep study per AASM standards. The baseline AHI was 65.3/h, and O2 nadir 73%. The patient was shown several different interfaces and was subsequently fitted with a medium full face mask from [employer] (Simplus).  The patient was started on a pressure of 5 cm of water pressure and gradually titrated to a final titration pressure of 11 cm. The patient is on home oxygen at 2 l/min. The study was started without supplemental oxygen. Supplemental oxygen was added at 2 l/min bleed-in during epoch 549, 1:20 AM, as his average oxygen saturations remained in the low 80s, despite CPAP therapy with adequate apnea control.   The AHI was improved and optimized at a pressure of 11 cm, on which the patient achieved a total sleep time of  56 minutes.  Residual AHI was 4.3, O2 nadir of 87%, with non-supine REM sleep achieved.     EEG: Review of the EEG showed no abnormal electrical discharges and symmetrical bihemispheric findings.     EKG: The EKG revealed normal sinus rhythm (NSR). The average heart rate during sleep was 82 bpm during the baseline portion of the study and 77 bpm during the treatment portion of the study.   AUDIO/VIDEO REVIEW: The audio and video review did not show any abnormal or unusual behaviors, movements, phonations or vocalizations. The patient took 2 restroom breaks. Snoring was noted, mostly in the mild range with improvement with PAP therapy.   POST-STUDY QUESTIONNAIRE: Post study, the patient indicated, that sleep was the same as usual.    IMPRESSION:   1. Severe Obstructive Sleep Apnea (OSA) 2. Nocturnal hypoxemia 3. 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 Dysfunctions associated with sleep stages or arousal from sleep   RECOMMENDATIONS:   1. This patient has severe obstructive sleep apnea. The patient qualified for an emergency  split sleep study per AASM standards. The baseline AHI was 65.3/h, and O2 nadir 73%.  The patient responded well to PAP therapy, but did require supplemental oxygen at 2 lpm (which is his home oxygen supplementation) with CPAP therapy of 11 cm. I recommend home CPAP therapy at a pressure of 11 cm via medium Simplus full face mask with heated humidity (or mask of choice, sized to fit, EPR as per tolerance) and O2 at 2 lpm bleed-in. The patient will be advised to be fully compliant with PAP therapy to improve sleep related symptoms and decrease long term cardiovascular risks. Please note, that untreated obstructive sleep apnea may carry additional perioperative morbidity. Patients with significant obstructive sleep apnea should receive perioperative PAP therapy and the surgeons and particularly the anesthesiologist should be informed of the diagnosis and the severity of the sleep disordered breathing. 2. This study shows sleep fragmentation and abnormal sleep  stage percentages; these are nonspecific findings and per se do not signify an intrinsic sleep disorder or a cause for the patient's sleep-related symptoms. Causes include (but are not limited to) the first night effect of the sleep study, circadian rhythm disturbances, medication effect or an underlying mood disorder or medical problem.  3. Severe PLMs (periodic limb movements of sleep) were noted during the treatment portion of the study with no significant arousals; clinical correlation is recommended. PLMs may improve with OSA treatment.  4. The patient should be cautioned not to drive, work at heights, or operate dangerous or heavy equipment when tired or sleepy. Review and reiteration of good sleep hygiene measures should be pursued with any patient. 5. The patient will be seen in follow-up in the sleep clinic at [facility] for discussion of the test results, symptom and treatment compliance review, further management strategies, etc. The referring provider  will be notified of the test results.   I certify that I have reviewed the entire raw data recording prior to the issuance of this report in accordance with the Standards of Accreditation of the American Academy of Sleep Medicine (AASM).   Star Age, MD, PhD Medical Director, [facility]) [place], ABPN (Neurology and Sleep)          Technical Report:   [facility] [place]  Name: Jeremiah [name], Jeremiah [name] BMI: [id number] Physician: Star Age, MD  ID: [id number] Height: 70.0 in Technician: Richard Miu, RPSGT  Sex: Male Weight: 192.0 lb Record: [id number]  Age: 83 [08/24/1940] Date: 07/16/2022     Medical & Medication History    82 year old right-handed gentleman with an underlying complex medical history of hypertension, hyperlipidemia, BPH, central retinal artery occlusion, osteoarthritis, prostate cancer, status postradiation treatment, allergies, small bowel obstruction, diabetes, diverticulosis, kidney stones, tremor and gait disorder (seen by Dr. 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 He just recently got a new machine less than a week ago after he came home from the his hospital stay. He was hospitalized in September with respiratory failure, pneumonia and confusion. He had a heart attack in early September 2023. He sees Dr. Salomon Fick for his NPH. He is currently in home health therapy. He has tried his new CPAP machine but is having trouble tolerating it. He has oxygen via concentrator 24-7, at 2 L/min and uses oxygen with his CPAP. He was previously followed in our sleep clinic but has not been seen in this clinic in over 4-1/2 years. He had a split-night sleep study on 08/29/2015 which showed a baseline AHI of  57.4/h, O2 nadir 73%. He was started on CPAP therapy.  Tylenol, Aspirin, Coreg, Zyrtec, Vitamin D3, Plavix, Vitamin B 12, Farxiga, Zetia, Antivert, Melatonin, Glucophage, Protonix, Crestor, Flomax   Sleep Disorder      Comments   The patient came into the sleep lab for a PSG. He had a prior sleep study on 08/29/2015 with our sleep lab. Oxygen was not added during the first part of the study due to low oxygen being associated with respiratory events. The patient was SPLIT due to having an AHI of 40 or greater after 2 hrs of TST. The patient was fitted with a F&P Simplus (FFM) size Med. The mask was a good fit to face, the patient tolerated the mask well.  CPAP was started at [facility] with EPR of 1. Oxygen was initiated at 2/lpm due to the patient having low O2 with no respiratory events. The patient is on 2/lpm of oxygen at home. CPAP was titrated up to [place] 6 of 1 due to the patient having respiratory events. Two restroom trips. EKG kept in NSR. Mild snoring. All respiratory events scored with a 4% desat. The patient slept supine and lateral. Per patient he did not take or bring Melatonin for sleep study. Per patient he does not use oxygen all the time. PLM's noted.    Baseline Sleep Stage Information Baseline start time: 08:48:49 PM Baseline end time: 12:21:45 AM   Time Total Supine Side Prone Upright  Recording 3h 33.78m0h 13.576mh 19.21m76m 0.216560m 4m0.216560m  69mep 2h 4.216560m 0h36m216560m 2h 62mm 0h 044m 0h 0.88m  Late62m N1 N2 N3 REM Onset Per. Slp. Eff.  Actual 0h 0.216560m 0h 1.216560m2216560m 8.216560m 39m0.216560m 077m4.21m 171m9.216560m 58521m%   Stg62mr Wake N1 N2 N3 REM  Total 89.0 40.0 61.0 23.0 0.0  Supine 13.5 0.0 0.0 0.0 0.0  Side 75.5 40.0 61.0 23.0 0.0  Prone 0.0 0.0 0.0 0.0 0.0  Upright 0.0 0.0 0.0 0.0 0.0   Stg % Wake N1 N2 N3 REM  Total 41.8 32.3 49.2 18.5 0.0  Supine 6.3 0.0 0.0 0.0 0.0  Side 35.4 32.3 49.2 18.5 0.0  Prone 0.0 0.0 0.0 0.0 0.0  Upright 0.0 0.0 0.0 0.0 0.0    CPAP Sleep Stage Information CPAP  start time: 12:21:45 AM CPAP end time: 04:58:07 AM   Time Total Supine Side Prone Upright  Recording (TRT) 4h 36.21m 0h 0.216560m 4h 3636m 0h 0.63m0h 0.216560m12mleep (87m) 2h 3321mm 0h 0.216560m 2h 38.216560m 014m.216560m 0h42m216560m   La5216560mcy N1 28mN3 REM96mset Per. Slp. Eff.  Actual 0h 0.216560m 0h 1.216560m 0h 46.21m 0h 1.510216560mh 15.546mh 15.21m64m.14%  54mg Dur W77m N1 N2 24mREM  Total 118.5 13.0 81.0 9.0 55.0  Supine 0.0 0.0 0.0 0.0 0.0  Side 118.5 13.0 81.0 9.0 55.0  Prone 0.0 0.0 0.0 0.0 0.0  Upright 0.0 0.0 0.0 0.0 0.0   Stg % Wake N1 N2 N3 REM  Total 42.9 8.2 51.3 5.7 34.8  Supine 0.0 0.0 0.0 0.0 0.0  Side 42.9 8.2 51.3 5.7 34.8  Prone 0.0 0.0 0.0 0.0 0.0  Upright 0.0 0.0 0.0 0.0 0.0    Baseline Respiratory Information Apnea Summary Sub Supine Side Prone Upright  Total 27 Total 27 0 27 0 0    REM 0 0 0 0 0    NREM 27 0 27 0 0  Obs 27 REM 0 0 0 0 0    NREM 27 0 27 0 0  Mix 0 REM 0 0 0 0 0    NREM 0 0 0 0 0  Cen 0 REM 0 0 0 0 0    NREM 0 0 0 0 0   Rera Summary Sub Supine Side Prone Upright  Total 0 Total 0 0 0 0 0    REM 0 0 0 0 0    NREM 0 0 0 0 0   Hypopnea Summary Sub Supine Side Prone Upright  Total 108 Total 108 0 108 0 0    REM 0 0 0 0 0    NREM 108 0 108 0 0   4% Hypopnea Summary Sub Supine Side  Prone Upright  Total (4%) 107 Total 107 0 107 0 0    REM 0 0 0 0 0    NREM 107 0 107 0 0     AHI Total Obs Mix Cen  65.32 Apnea 13.06 13.06 0.00 0.00   Hypopnea 52.26 -- -- --  64.84 Hypopnea (4%) 51.77 -- -- --    Total Supine Side Prone Upright  Position AHI 65.32 0.00 65.32 0.00 0.00  REM AHI 0.00   NREM AHI 65.32   Position RDI 65.32 0.00 65.32 0.00 0.00  REM RDI 0.00   NREM RDI 65.32    4% Hypopnea Total Supine Side Prone Upright  Position AHI (4%) 64.84 0.00 64.84 0.00 0.00  REM AHI (4%) 0.00   NREM AHI (4%) 64.84   Position RDI (4%) 64.84 0.00 64.84 0.00 0.00  REM RDI (4%) 0.00   NREM RDI (4%) 64.84    CPAP Respiratory Information Apnea Summary Sub Supine Side Prone Upright   Total 2 Total 2 0 2 0 0    REM 1 0 1 0 0    NREM 1 0 1 0 0  Obs 0 REM 0 0 0 0 0    NREM 0 0 0 0 0  Mix 0 REM 0 0 0 0 0    NREM 0 0 0 0 0  Cen 2 REM 1 0 1 0 0    NREM 1 0 1 0 0   Rera Summary Sub Supine Side Prone Upright  Total 0 Total 0 0 0 0 0    REM 0 0 0 0 0    NREM 0 0 0 0 0   Hypopnea Summary Sub Supine Side Prone Upright  Total 17 Total 17 0 17 0 0    REM 13 0 13 0 0    NREM 4 0 4 0 0   4% Hypopnea Summary Sub Supine Side Prone Upright  Total (4%) 17 Total 17 0 17 0 0    REM 13 0 13 0 0    NREM 4 0 4 0 0     AHI Total Obs Mix Cen  7.22 Apnea 0.76 0.00 0.00 0.76   Hypopnea 6.46 -- -- --  7.22 Hypopnea (4%) 6.46 -- -- --    Total Supine Side Prone Upright  Position AHI 7.22 0.00 7.22 0.00 0.00  REM AHI 15.27   NREM AHI 2.91   Position RDI 7.22 0.00 7.22 0.00 0.00  REM RDI 15.27   NREM RDI 2.91    4% Hypopnea Total Supine Side Prone Upright  Position AHI (4%) 7.22 0.00 7.22 0.00 0.00  REM AHI (4%) 15.27   NREM AHI (4%) 2.91   Position RDI (4%) 7.22 0.00 7.22 0.00 0.00  REM RDI (4%) 15.27   NREM RDI (4%) 2.91    Desaturation Information (Baseline)  <100% <90% <80% <70% <60% <50% <40%  Supine 5 4 0 0 0 0 0  Side 198 197 47 0 0 0 0  Prone 0 0 0 0 0 0 0  Upright 0 0 0 0 0 0 0  Total 203 201 47 0 0 0 0  Desaturation threshold setting: 3% Minimum desaturation setting: 10 seconds SaO2 nadir: 73% The longest event was a 57 sec obstructive Hypopneawith a minimum SaO2 of 81%. The lowest SaO2 was 73% associated with a 26 sec obstructive Apnea. Awakening/Arousal Information (Baseline) # of Awakenings 29  Wake after sleep onset 64.19m Wake after persistent  sleep 40.74m  Arousal Assoc. Arousals Index  Apneas 15 7.3  Hypopneas 48 23.2  Leg Movements 0 0.0  Snore 0.0 0.0  PTT Arousals 0 0.0  Spontaneous 38 18.4  Total 100 48.4   Desaturation Information (CPAP)  <100% <90% <80% <70% <60% <50% <40%  Supine 0 0 0 0 0 0 0  Side 94 84 10 0 0 0 0  Prone 0 0  0 0 0 0 0  Upright 0 0 0 0 0 0 0  Total 94 84 10 0 0 0 0  Desaturation threshold setting: 3% Minimum desaturation setting: 10 seconds SaO2 nadir: 73% The longest event was a 49 sec obstructive Hypopnea with a minimum SaO2 of 74%. The lowest SaO2 was 73% associated with a 21 sec obstructive Hypopnea. Awakening/Arousal Information (CPAP) # of Awakenings 7  Wake after sleep onset 103.0367mWake after persistent sleep 103.7m64mArousal Assoc. Arousals Index  Apneas 0 0.0  Hypopneas 0 0.0  Leg Movements 0 0.0  Snore 0.0 0.0  PTT Arousals 0 0.0  Spontaneous 39 14.8  Total 39 14.8     EKG Rates (Baseline) EKG Avg Max Min  Awake 84 97 71  Asleep 82 91 67  EKG Events: Tachycardia Myoclonus Information (Baseline) PLMS LMs Index  Total LMs during PLMS 0 0.0  LMs w/ Microarousals 0 0.0   LM LMs Index  w/ Microarousal 0 0.0  w/ Awakening 0 0.0  w/ Resp Event 0 0.0  Spontaneous 6 2.9  Total 6 2.9   EKG Rates (CPAP) EKG Avg Max Min  Awake 81 97 67  Asleep 77 86 48  EKG Events: Tachycardia Myoclonus Information (CPAP) PLMS LMs Index  Total LMs during PLMS 213 80.9  LMs w/ Microarousals 0 0.0   LM LMs Index  w/ Microarousal 0 0.0  w/ Awakening 0 0.0  w/ Resp Event 0 0.0  Spontaneous 3 1.1  Total 3 1.1      Titration Table:  [facility] CPAP/Bilevel Report    General Information  Name: Jeremiah [name], Jeremiah [name]: 26 Physician: AthStar AgeD: [id number]: [age] Technician: FieRichard Miuex: Male Weight: 192 lb Record: [id number]  Age: 78 79/21941-10-12ate: 07/16/2022 Scorer: SheVita Barleyelds   Recommended Settings IPAP: N/A cmH20 EPAP: N/A cmH2O AHI: N/A AHI (4%): N/A   Pressure IPAP/EPAP 00 05 / 00 06 08 09 09 11 11   O2 Vol 0.0 0.0 0.0 0.0 2.0 0.0 2.0 0.0  Time TRT 209.67m 34mm 288mm 1021m 12861m 26.5467m4.67m78m.7m 39mST 171m7m 0.7m 6.593m0.547m1.042m4.67m47m.7m 49m67m  56mep S67me % 41me 40.8 100.0 70.5 0.0 44.5 45.3 57.8  7.1   % REM 0.0 0.0 76.9 100.0 15.5 55.2 0.0 63.1   % N1 32.3 0.0 15.4 0.0 7.7 10.3 0.0 15.4   % N2 49.2 0.0 7.7 0.0 64.1 34.5 100.0 21.5   % N3 18.5 0.0 0.0 0.0 12.7 0.0 0.0 0.0  Respiratory Total Events 135 0 '2 6 5 3 2 1   '$ Obs. Apn. 27 0 0 0 0 0 0 0   Mixed Apn. 0 0 0 0 0 0 0 0   Cen. Apn. 0 0 0 0 1 1 0 0   Hypopneas 108 0 '2 6 4 2 2 1   '$ AHI 65.32 0.00 18.46 34.29 4.23 12.41 5.22 1.85   Supine AHI 0.00 0.00 0.00 0.00 0.00 0.00 0.00 0.00  Prone AHI 0.00 0.00 0.00 0.00 0.00 0.00 0.00 0.00   Side AHI 65.32 0.00 18.46 34.29 4.23 12.41 5.22 1.85  Respiratory (4%) Hypopneas (4%) 107.00 0.00 2.00 6.00 4.00 2.00 2.00 1.00   AHI (4%) 64.84 0.00 18.46 34.29 4.23 12.41 5.22 1.85   Supine AHI (4%) 0.00 0.00 0.00 0.00 0.00 0.00 0.00 0.00   Prone AHI (4%) 0.00 0.00 0.00 0.00 0.00 0.00 0.00 0.00   Side AHI (4%) 64.84 0.00 18.46 34.29 4.23 12.41 5.22 1.85  Desat Profile <= 90% 194.657m3.134657m0.4657m9657m.39m 54m657m 268mm 4664m 31.84657m  <=439m% 25.24m 0.4657m 79mm 8657m 0.59m1.719m.657m17m57m 157m= 7657m10.47m0.39m 0.346m 0.0757m.57m157m57m 29mm 079m   74m60%27m.4657m357m39m 7346mm 0.57m 0.57m 039m 5.64657m0.014mAro54657ml I28mx A639ma 7424m0.037m0 0.0 0.0 0.0 0.0 0.0   Hypopnea 23.2 0.0 0.0 0.0 0.0 0.0 0.0 0.0   LM 0.0 0.0 0.0 0.0 0.0 0.0 0.0 0.0   Spontaneous 18.4 0.0 9.2 0.0 15.2 4.1 18.3 22.2

## 2022-07-21 NOTE — Addendum Note (Signed)
Addended by: Star Age on: 07/21/2022 02:41 PM   Modules accepted: Orders

## 2022-07-24 ENCOUNTER — Telehealth: Payer: Self-pay | Admitting: *Deleted

## 2022-07-24 NOTE — Telephone Encounter (Signed)
-----   Message from Star Age, MD sent at 07/21/2022  2:41 PM EST ----- I saw patient on 02/08/2022 for reevaluation of his sleep apnea.  He had a split-night sleep study on 07/16/2022 which indicated severe sleep apnea and ongoing need for supplemental oxygen.  He has home oxygen already.  Please advise patient or family member on DPR that I would like for him to start home CPAP therapy along with supplemental oxygen at 2 L/min.  He may be eligible for a new CPAP machine or have a new machine already.  You can send the order to his current DME provider which should also be his oxygen company.  Please reinforce the importance of compliance with CPAP therapy and the need for a follow-up appointment within a certain window of set up.  He can schedule with [facility] as well.

## 2022-07-24 NOTE — Telephone Encounter (Signed)
Called main # on chart [name], daughter on [place]). LVM with office number asking for call back.

## 2022-07-25 NOTE — Telephone Encounter (Signed)
2nd attempt to reach daughter [name] to discuss results. LVM asking for call back.

## 2022-07-26 ENCOUNTER — Ambulatory Visit: Payer: Self-pay

## 2022-07-26 NOTE — Patient Outreach (Signed)
  Care Coordination   07/26/2022 Name: Jeremiah [name] MRN: [id number] DOB: [birth year]   Care Coordination Outreach Attempts:  An unsuccessful telephone outreach was attempted for a scheduled appointment today.  Follow Up Plan:  Additional outreach attempts will be made to offer the patient care coordination information and services.   Encounter Outcome:  No Answer   Care Coordination Interventions:  No, not indicated    Barb Merino, RN, BSN, CCM Care Management Coordinator [facility] Care Management Direct Phone: [phone]

## 2022-07-27 ENCOUNTER — Telehealth: Payer: Self-pay | Admitting: *Deleted

## 2022-07-27 NOTE — Telephone Encounter (Signed)
RE: new oro/nasal cpap.  needs O2 2/L bleed in(  has oxygen at home per daughter from [employer]) Received: Today Cloria Spring, RN; [name] [name], Lenna Sciara; Minus Liberty; [place], Patricia Received,O2 bleed in. Thanks!       Previous Messages    ----- Message ----- From: [name], RN Sent: 07/27/2022   2:07 PM EST To: [name]; Miquel Dunn; Nash Shearer; * Subject: new oro/nasal cpap.  needs O2 2/L bleed in( *  New order in EPIC for pt.  Pt has caregiver tues and thurs  (use those days for appts).  I believe also has a loaner machine from you all.  Rx: Please start URA SLAIGHT on (oro)-nasal CPAP (with all necessary PAP related supplies, such as hose, filters, connectors, headgear, chinstrap as needed) at a pressure of + 11 cm of water pressure with EPR of 1 or as per patient preference. Supplemental oxygen at 2 L/min bleed in. Mask: Medium Simplus fullface mask with heated humidification and addition of a chinstrap if needed. Please fax any compliance data to attention: Dr. Star Age. 07/21/2022 NPI: [id number]    Early Chars Boulanger Male, 82 y.o., [birth year] Pronouns: he/him/his MRN: [id number] Phone: [phone]  Thank you  Lovey Newcomer RN

## 2022-07-27 NOTE — Telephone Encounter (Signed)
ew, Willodean Rosenthal, RN; [name] [name], [place]; Minus Liberty; Nash Shearer Pap order Received, Thank you!       Previous Messages    ----- Message ----- From: [name], RN Sent: 07/27/2022   2:07 PM EST To: [name]; Miquel Dunn; Nash Shearer; * Subject: new oro/nasal cpap.  needs O2 2/L bleed in( *  New order in EPIC for pt.  Pt has caregiver tues and thurs  (use those days for appts).  I believe also has a loaner machine from you all.  Rx: Please start JAXSEN NAFTZGER on (oro)-nasal CPAP (with all necessary PAP related supplies, such as hose, filters, connectors, headgear, chinstrap as needed) at a pressure of + 11 cm of water pressure with EPR of 1 or as per patient preference. Supplemental oxygen at 2 L/min bleed in. Mask: Medium Simplus fullface mask with heated humidification and addition of a chinstrap if needed. Please fax any compliance data to attention: Dr. Star Age. 07/21/2022 NPI: [id number]    Early Chars Graw Male, 82 y.o., May 12, 1941 Pronouns: he/him/his MRN: [id number] Phone: [phone]  Thank you  Lovey Newcomer RN

## 2022-07-27 NOTE — Telephone Encounter (Signed)
-----   Message from Star Age, MD sent at 07/21/2022  2:41 PM EST ----- I saw patient on 02/08/2022 for reevaluation of his sleep apnea.  He had a split-night sleep study on 07/16/2022 which indicated severe sleep apnea and ongoing need for supplemental oxygen.  He has home oxygen already.  Please advise patient or family member on DPR that I would like for him to start home CPAP therapy along with supplemental oxygen at 2 L/min.  He may be eligible for a new CPAP machine or have a new machine already.  You can send the order to his current DME provider which should also be his oxygen company.  Please reinforce the importance of compliance with CPAP therapy and the need for a follow-up appointment within a certain window of set up.  He can schedule with [facility] as well.

## 2022-07-27 NOTE — Telephone Encounter (Signed)
I called [name], daughter of pt and gave her the results per below.  Pt has oxygen but is not using.  I relayed that will use same DME ([employer]) for the new cpap machine and oxygen supplementation at 2L/hour at night when using th cpap.  She verbalized understanding.  Pt is using a loaner right now, but is able to get new machine.  Sent message to [employer]. She verbalized understanding.

## 2022-08-03 ENCOUNTER — Telehealth: Payer: Self-pay | Admitting: *Deleted

## 2022-08-03 NOTE — Progress Notes (Signed)
  Care Coordination Note  08/03/2022 Name: Jeremiah [name] MRN: [id number] DOB: [birth year]  Jeremiah [name] is a 82 y.o. year old male who is a primary care patient of Perini, Elta Guadeloupe, MD and is actively engaged with the care management team. I reached out to [name] by phone today to assist with re-scheduling a follow up visit with the RN Case Manager  Follow up plan: Unsuccessful telephone outreach attempt made. A HIPAA compliant phone message was left for the patient providing contact information and requesting a return call.   [place]  Direct Dial: [phone]

## 2022-08-08 NOTE — Progress Notes (Signed)
  Care Coordination Note  08/08/2022 Name: Jeremiah [name] MRN: [id number] DOB: 1941/02/05  Jeremiah [name] is a 82 y.o. year old male who is a primary care patient of Perini, Elta Guadeloupe, MD and is actively engaged with the care management team. I reached out to [name] by phone today to assist with re-scheduling a follow up visit with the RN Case Manager  Follow up plan: Telephone appointment with care management team member scheduled for:08/23/22  [place]: [phone]

## 2022-08-09 DIAGNOSIS — J9601 Acute respiratory failure with hypoxia: Secondary | ICD-10-CM | POA: Diagnosis not present

## 2022-08-12 DIAGNOSIS — J9601 Acute respiratory failure with hypoxia: Secondary | ICD-10-CM | POA: Diagnosis not present

## 2022-08-16 ENCOUNTER — Telehealth: Payer: Self-pay | Admitting: *Deleted

## 2022-08-16 NOTE — Progress Notes (Signed)
  Care Coordination Note  08/16/2022 Name: [name] MRN: [id number] DOB: [birth year]  Jeremiah [name] is a 82 y.o. year old male who is a primary care patient of Perini, Elta Guadeloupe, MD and is actively engaged with the care management team. I reached out to [name] by phone today to assist with re-scheduling a follow up visit with the RN Case Manager  Follow up plan: Unsuccessful telephone outreach attempt made. A HIPAA compliant phone message was left for the patient providing contact information and requesting a return call.  [place]  Direct Dial: [phone]

## 2022-08-21 NOTE — Progress Notes (Signed)
  Care Coordination Note  08/21/2022 Name: Jeremiah [name] MRN: [id number] DOB: Oct 13, 1940  Jeremiah [name] is a 82 y.o. year old male who is a primary care patient of Perini, Elta Guadeloupe, MD and is actively engaged with the care management team. I reached out to [name] by phone today to assist with provider appointment scheduling a follow up visit with the RN Case Manager  Follow up plan: Unsuccessful telephone outreach attempt made. A HIPAA compliant phone message was left for the patient providing contact information and requesting a return call. We have been unable to make contact with the patient for follow up. The care management team is available to follow up with the patient after provider conversation with the patient regarding recommendation for care management engagement and subsequent re-referral to the care management team.  [place]  Direct Dial: [phone]

## 2022-09-09 DIAGNOSIS — J9601 Acute respiratory failure with hypoxia: Secondary | ICD-10-CM | POA: Diagnosis not present

## 2022-09-12 DIAGNOSIS — J9601 Acute respiratory failure with hypoxia: Secondary | ICD-10-CM | POA: Diagnosis not present

## 2022-09-20 DIAGNOSIS — E119 Type 2 diabetes mellitus without complications: Secondary | ICD-10-CM | POA: Diagnosis not present

## 2022-09-20 DIAGNOSIS — H353132 Nonexudative age-related macular degeneration, bilateral, intermediate dry stage: Secondary | ICD-10-CM | POA: Diagnosis not present

## 2022-09-20 DIAGNOSIS — Z961 Presence of intraocular lens: Secondary | ICD-10-CM | POA: Diagnosis not present

## 2022-09-20 DIAGNOSIS — H349 Unspecified retinal vascular occlusion: Secondary | ICD-10-CM | POA: Diagnosis not present

## 2022-09-25 DIAGNOSIS — G4733 Obstructive sleep apnea (adult) (pediatric): Secondary | ICD-10-CM | POA: Diagnosis not present

## 2022-09-26 DIAGNOSIS — L718 Other rosacea: Secondary | ICD-10-CM | POA: Diagnosis not present

## 2022-09-26 DIAGNOSIS — L723 Sebaceous cyst: Secondary | ICD-10-CM | POA: Diagnosis not present

## 2022-09-26 DIAGNOSIS — L570 Actinic keratosis: Secondary | ICD-10-CM | POA: Diagnosis not present

## 2022-09-26 DIAGNOSIS — L821 Other seborrheic keratosis: Secondary | ICD-10-CM | POA: Diagnosis not present

## 2022-09-26 DIAGNOSIS — Z85828 Personal history of other malignant neoplasm of skin: Secondary | ICD-10-CM | POA: Diagnosis not present

## 2022-10-09 DIAGNOSIS — J9601 Acute respiratory failure with hypoxia: Secondary | ICD-10-CM | POA: Diagnosis not present

## 2022-10-12 DIAGNOSIS — J9601 Acute respiratory failure with hypoxia: Secondary | ICD-10-CM | POA: Diagnosis not present

## 2022-10-26 DIAGNOSIS — G4733 Obstructive sleep apnea (adult) (pediatric): Secondary | ICD-10-CM | POA: Diagnosis not present

## 2022-11-09 DIAGNOSIS — J9601 Acute respiratory failure with hypoxia: Secondary | ICD-10-CM | POA: Diagnosis not present

## 2022-11-12 DIAGNOSIS — J9601 Acute respiratory failure with hypoxia: Secondary | ICD-10-CM | POA: Diagnosis not present

## 2022-11-25 DIAGNOSIS — G4733 Obstructive sleep apnea (adult) (pediatric): Secondary | ICD-10-CM | POA: Diagnosis not present

## 2022-12-09 DIAGNOSIS — J9601 Acute respiratory failure with hypoxia: Secondary | ICD-10-CM | POA: Diagnosis not present

## 2022-12-12 DIAGNOSIS — J9601 Acute respiratory failure with hypoxia: Secondary | ICD-10-CM | POA: Diagnosis not present

## 2023-01-08 DIAGNOSIS — I130 Hypertensive heart and chronic kidney disease with heart failure and stage 1 through stage 4 chronic kidney disease, or unspecified chronic kidney disease: Secondary | ICD-10-CM | POA: Diagnosis not present

## 2023-01-08 DIAGNOSIS — N1831 Chronic kidney disease, stage 3a: Secondary | ICD-10-CM | POA: Diagnosis not present

## 2023-01-08 DIAGNOSIS — E1151 Type 2 diabetes mellitus with diabetic peripheral angiopathy without gangrene: Secondary | ICD-10-CM | POA: Diagnosis not present

## 2023-01-08 DIAGNOSIS — E785 Hyperlipidemia, unspecified: Secondary | ICD-10-CM | POA: Diagnosis not present

## 2023-01-09 DIAGNOSIS — J9601 Acute respiratory failure with hypoxia: Secondary | ICD-10-CM | POA: Diagnosis not present

## 2023-01-12 DIAGNOSIS — J9601 Acute respiratory failure with hypoxia: Secondary | ICD-10-CM | POA: Diagnosis not present

## 2023-01-14 NOTE — Progress Notes (Unsigned)
Cardiology Office Note:    Date:  01/17/2023   ID:  BURNS SKLUZACEK, DOB Oct 04, 1940, MRN [id number]  PCP:  Rodrigo Ran, MD   [facility] Providers Cardiologist:  Diamonique Ruedas [place], MD     Referring MD: Rodrigo Ran, MD   Chief Complaint  Patient presents with   Coronary Artery Disease    History of Present Illness:    Jeremiah [name] is a 82 y.o. male seen for follow up CAD. He has a hx of normal pressure hydrocephalus s/p VP shunt, chronic respiratory failure on home O2, hypertension, hyperlipidemia, and DM2.    Patient was seen in the hospital on 01/21/2022 for evaluation of chest pain.  Troponin was mildly elevated, however patient had uncontrolled blood pressure in the 190s.  Troponin eventually peaked at 823 before trending back down.  Echocardiogram on 01/21/2022 showed EF 55 to 60%, hypokinesis of the mid RV with relative apical sparing, mild MR, mild to moderate AI, mildly dilated aortic root.  Subsequent cardiac catheterization performed on 01/24/2022 showed 50% proximal LAD, 50% D1, 15% proximal to mid RCA lesion, 90% RPDA lesion treated with Onyx frontier 2.5 x 12 mm DES.  Due to history of intolerance of Lipitor in the past, it was recommended to consider PCSK9 inhibitor as outpatient.  He was discharged on aspirin and Plavix.  Carvedilol increased to 12.5 mg twice a day.  Unfortunately patient returned back to the hospital 2 days after he was discharged with shortness of breath, generalized weakness, lethargy and a nonproductive cough.  O2 saturation was in the 80s on home 2 L oxygen.  CTA negative for PE but showed bilateral lower lobe infiltrate.  He was started on IV antibiotic for hospital acquired pneumonia.   Patient was treated for acute on chronic hypoxic respiratory failure.  On 9/12, patient developed acute encephalopathy of unclear etiology.  Code stroke was called, extensive imaging was done revealing, EEG showed nonspecific abnormality.  Neurology felt patient had  cefepime induced encephalopathy.  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 He is followed by Neuro for severe sleep apnea.   On follow up today he is doing OK. States breathing is OK. He denies any chest pain. Complains of insomnia. Stressed with his wife who has dementia. He did have a sleep study by [facility] in Feb showing severe sleep apnea. States he has not been able to use CPAP because he can't sleep. Has not had follow up with Neuro about this. Doesn't really know his medications but notes he has a CNA come twice a week and they help with meds.    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   S/P radiation therapy 04/23/13 - 05/29/14   Prostate/seminal vesicles, external beam 4500 cGy in 25 sessions   Seasonal allergies    Skin cancer    scalp   Small bowel obstruction (HCC) 06/10/2016   Wears glasses     Past Surgical History:  Procedure Laterality Date   BACK  SURGERY  2009   CARDIAC CATHETERIZATION     COLONOSCOPY W/ BIOPSIES AND POLYPECTOMY     CORONARY STENT INTERVENTION N/A 01/24/2022   Procedure: CORONARY STENT INTERVENTION;  Surgeon: [place], Roshaun Pound M, MD;  Location: [facility] INVASIVE CV LAB;  Service: Cardiovascular;  Laterality: N/A;   CYSTOSCOPY W/ URETERAL STENT PLACEMENT Left 05/12/2021   Procedure: CYSTOSCOPY WITH RETROGRADE PYELOGRAM/ LEFT URETERAL STENT PLACEMENT.;  Surgeon: Jannifer Hick, MD;  Location: [facility] OR;  Service: Urology;  Laterality: Left;   HYDROCELE EXCISION  1963   LEFT HEART CATH AND CORONARY ANGIOGRAPHY N/A 03/14/2019   Procedure: LEFT HEART CATH AND CORONARY ANGIOGRAPHY;   Surgeon: Kathleene Hazel, MD;  Location: [facility] INVASIVE CV LAB;  Service: Cardiovascular;  Laterality: N/A;   LEFT HEART CATH AND CORONARY ANGIOGRAPHY N/A 01/24/2022   Procedure: LEFT HEART CATH AND CORONARY ANGIOGRAPHY;  Surgeon: [place], Shailynn Fong M, MD;  Location: [facility] INVASIVE CV LAB;  Service: Cardiovascular;  Laterality: N/A;   PROSTATE BIOPSY  2013, 2014, 01/2014   Gleason 7   RADIOACTIVE SEED IMPLANT N/A 07/01/2014   Procedure: RADIOACTIVE SEED IMPLANT;  Surgeon: Valetta Fuller, MD;  Location: [facility];  Service: Urology;  Laterality: N/A;  DR PORTABLE   TONSILLECTOMY     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   Procedure: SHUNT INSERTION VENTRICULAR-PERITONEAL;  Surgeon: Tia Alert, MD;  Location: [facility] OR;  Service: Neurosurgery;  Laterality: N/A;  SHUNT INSERTION VENTRICULAR-PERITONEAL    Current Medications: No outpatient medications have been marked as taking for the 01/17/23 encounter (Office Visit) with [place], Pluma Diniz M, MD.     Allergies:   Lipitor [atorvastatin]   Social History   Socioeconomic History   Marital status: Married    Spouse name: Not on file   Number of children: 2   Years of education: 13   Highest education level: Some college, no degree  Occupational History   Occupation: retired    Comment: all Equities trader  Tobacco Use   Smoking status: Former    Current packs/day: 0.00    Types: Cigarettes    Quit date: 03/10/1961    Years since quitting: 61.8   Smokeless tobacco: Never   Tobacco comments:    Pt states he quit smoking 60 plus years ago and he remembers smoking maybe a pack a day. ALS 04/20/22  Vaping Use   Vaping status: Never Used  Substance and Sexual Activity   Alcohol use: Not Currently    Alcohol/week: 1.0 standard drink of alcohol    Types: 1 Glasses of wine per week    Comment: rare alcohol   Drug use: No   Sexual activity: Not on file  Other Topics Concern   Not on file  Social History Narrative   Rare  caffeine use    Social Determinants of Health   Financial Resource Strain: Not on file  Food Insecurity: No Food Insecurity (02/06/2022)   Hunger Vital Sign    Worried About Running Out of Food in the Last Year: Never true    Ran Out of Food in the Last Year: Never true  Transportation Needs: No Transportation Needs (02/13/2022)   PRAPARE - Administrator, Civil Service (Medical): No    Lack of Transportation (Non-Medical): No  Physical Activity: Unknown (06/13/2018)   Exercise Vital Sign    Days of Exercise per Week: Patient declined    Minutes of Exercise per Session: Patient declined  Stress: No Stress Concern Present (06/13/2018)  [employer] of Occupational Health - Occupational Stress Questionnaire    Feeling of Stress : Only a little  Social Connections: Unknown (06/13/2018)   Social Connection and Isolation Panel [NHANES]    Frequency of Communication with Friends and Family: More than three times a week    Frequency of Social Gatherings with Friends and Family: More than three times a week    Attends Religious Services: Not on Marketing executive or Organizations: Not on file    Attends Banker Meetings: Not on file    Marital Status: Not on file     Family History: The patient's family history includes Cancer in his father and paternal uncle; Dementia (age of onset: 61) in his mother; Diabetes in his father; Heart attack (age of onset: 64) in his father; Lung cancer in his brother.  ROS:   Please see the history of present illness.     All other systems reviewed and are negative.  EKGs/Labs/Other Studies Reviewed:    The following studies were reviewed today:  Echo 01/21/2022  1. Left ventricular ejection fraction, by estimation, is 55 to 60%. The  left ventricle has normal function. Left ventricular endocardial border  not optimally defined to evaluate regional wall motion. Left ventricular  diastolic parameters are   indeterminate.   2. Right ventricular systolic function is mildly reduced with hypokinesis  of the mid ventricle and relative apical sparing. The right ventricular  size is mildly enlarged. IVC not assessed, cannot calculate RVSP.   3. Dilated coronary sinus.   4. The mitral valve is normal in structure. Mild mitral valve  regurgitation. No evidence of mitral stenosis.   5. The aortic valve is tricuspid. Aortic valve regurgitation is mild to  moderate. No aortic stenosis is present.   6. Aortic dilatation noted. There is mild dilatation of the aortic root,  measuring 42 mm.    Cath 01/24/2022   Prox LAD lesion is 50% stenosed.   1st Diag lesion is 50% stenosed.   Prox RCA to Mid RCA lesion is 15% stenosed.   RPDA lesion is 90% stenosed.   A drug-eluting stent was successfully placed using a STENT ONYX FRONTIER 2.5X12.   Post intervention, there is a 0% residual stenosis.   LV end diastolic pressure is normal.   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 Anticipate DC in am   EKG Interpretation Date/Time:  Wednesday January 17 2023 09:21:07 EDT Ventricular Rate:  72 PR Interval:  166 QRS Duration:  126 QT Interval:  422 QTC Calculation: 462 R Axis:   58  Text Interpretation: Normal sinus rhythm Right bundle branch block When compared with ECG of 27-Jan-2022 16:25, No significant change since last tracing Confirmed by [place], Savanah Bayles [phone]) on 01/17/2023 9:45:04 AM    Recent Labs: 02/01/2022: BUN 28; Creatinine, Ser 1.28; Hemoglobin 14.4; Magnesium 1.9; Platelets 179; Potassium 4.2; Sodium 145 03/14/2022: ALT 6  Recent Lipid Panel    Component Value Date/Time   CHOL 140 03/14/2022 1113   TRIG 181 (H) 03/14/2022 1113   HDL 44 03/14/2022 1113   CHOLHDL 3.2 03/14/2022 1113   CHOLHDL 4.8 01/21/2022 0200   VLDL 53 (H) 01/21/2022 0200   LDLCALC 66 03/14/2022 1113   Dated 05/30/22:  cholesterol 135, triglycerides 189. HDL 43, LDL 54. A1c 6.9%.   Risk Assessment/Calculations:  Physical Exam:    VS:  BP 130/84   Pulse 72   Ht 5\' 10"  (1.778 m)   Wt 191 lb (86.6 kg)   SpO2 93%   BMI 27.41 kg/m        Wt Readings from Last 3 Encounters:  01/17/23 191 lb (86.6 kg)  07/19/22 188 lb (85.3 kg)  07/11/22 188 lb (85.3 kg)     GEN:  Well nourished, well developed in no acute distress HEENT: Normal NECK: No JVD; No carotid bruits LYMPHATICS: No lymphadenopathy CARDIAC: RRR, no murmurs, rubs, gallops RESPIRATORY:  Clear to auscultation without rales, wheezing or rhonchi  ABDOMEN: Soft, non-tender, non-distended MUSCULOSKELETAL:  No edema; No deformity  SKIN: Warm and dry NEUROLOGIC:  Alert and oriented x 3 PSYCHIATRIC:  Normal affect   ASSESSMENT:    1. Coronary artery disease involving native coronary artery of native heart without angina pectoris   2. Primary hypertension   3. Hyperlipidemia LDL goal <70   4. Chronic respiratory failure with hypoxia, on home O2 therapy (HCC) - 2 L/min prn per patient      PLAN:    In order of problems listed above:  CAD s/p stenting with DES to RPDA with 2.5 x 12 mm Onyx frontier drug-eluting stent on 01/24/2022.  He has no significant angina. Plan DAPT for one year. May discontinue Plavix in one month. Continue ASA.  Plan follow up in 6 months.  Hyperlipidemia: Continue on Crestor.  LDL is at goal 54. On crestor.   Hypertension: Blood pressure is well controlled.   Chronic respiratory failure: On home oxygen.  5.   DM per PCP  6.  Severe sleep apnea - recommend he arrange follow up with [facility] who had recommended CPAP before.            Medication Adjustments/Labs and Tests Ordered: Current medicines are reviewed at length with the patient today.  Concerns regarding medicines are outlined above.  Orders Placed This Encounter  Procedures   EKG 12-Lead   No orders of the defined  types were placed in this encounter.   There are no Patient Instructions on file for this visit.   Signed, Aleiah Mohammed [place], MD  01/17/2023 9:43 AM    [facility]

## 2023-01-17 ENCOUNTER — Ambulatory Visit: Payer: Medicare Other | Attending: Cardiology | Admitting: Cardiology

## 2023-01-17 ENCOUNTER — Encounter: Payer: Self-pay | Admitting: Cardiology

## 2023-01-17 VITALS — BP 130/84 | HR 72 | Ht 70.0 in | Wt 191.0 lb

## 2023-01-17 DIAGNOSIS — J9611 Chronic respiratory failure with hypoxia: Secondary | ICD-10-CM

## 2023-01-17 DIAGNOSIS — Z9981 Dependence on supplemental oxygen: Secondary | ICD-10-CM

## 2023-01-17 DIAGNOSIS — I2510 Atherosclerotic heart disease of native coronary artery without angina pectoris: Secondary | ICD-10-CM | POA: Diagnosis not present

## 2023-01-17 DIAGNOSIS — E785 Hyperlipidemia, unspecified: Secondary | ICD-10-CM | POA: Diagnosis not present

## 2023-01-17 DIAGNOSIS — I10 Essential (primary) hypertension: Secondary | ICD-10-CM

## 2023-01-17 NOTE — Patient Instructions (Signed)
Medication Instructions:  STOP PLAVIX IN 1 MONTH *If you need a refill on your cardiac medications before your next appointment, please call your pharmacy*   Lab Work: NO LABS If you have labs (blood work) drawn today and your tests are completely normal, you will receive your results only by: MyChart Message (if you have MyChart) OR A paper copy in the mail If you have any lab test that is abnormal or we need to change your treatment, we will call you to review the results.   Testing/Procedures: NO TESTING   Follow-Up: At [facility], you and your health needs are our priority.  As part of our continuing mission to provide you with exceptional heart care, we have created designated Provider Care Teams.  These Care Teams include your primary Cardiologist (physician) and Advanced Practice Providers (APPs -  Physician Assistants and Nurse Practitioners) who all work together to provide you with the care you need, when you need it.   Your next appointment:   6 month(s)  Provider:   Peter [place], MD or APP  Other Instructions RECOMMEND FOLLOW UP WITH [facility] FOR SLEEP STUDY.

## 2023-02-09 DIAGNOSIS — J9601 Acute respiratory failure with hypoxia: Secondary | ICD-10-CM | POA: Diagnosis not present

## 2023-02-12 DIAGNOSIS — J9601 Acute respiratory failure with hypoxia: Secondary | ICD-10-CM | POA: Diagnosis not present

## 2023-02-20 DIAGNOSIS — I5032 Chronic diastolic (congestive) heart failure: Secondary | ICD-10-CM | POA: Diagnosis not present

## 2023-02-27 DIAGNOSIS — M2550 Pain in unspecified joint: Secondary | ICD-10-CM | POA: Diagnosis not present

## 2023-03-11 DIAGNOSIS — J9601 Acute respiratory failure with hypoxia: Secondary | ICD-10-CM | POA: Diagnosis not present

## 2023-03-14 DIAGNOSIS — J9601 Acute respiratory failure with hypoxia: Secondary | ICD-10-CM | POA: Diagnosis not present

## 2023-04-11 DIAGNOSIS — J9601 Acute respiratory failure with hypoxia: Secondary | ICD-10-CM | POA: Diagnosis not present

## 2023-04-14 DIAGNOSIS — J9601 Acute respiratory failure with hypoxia: Secondary | ICD-10-CM | POA: Diagnosis not present

## 2023-04-27 ENCOUNTER — Telehealth: Payer: Self-pay | Admitting: Cardiology

## 2023-04-27 ENCOUNTER — Other Ambulatory Visit: Payer: Self-pay

## 2023-04-27 ENCOUNTER — Emergency Department (HOSPITAL_COMMUNITY): Payer: Medicare Other

## 2023-04-27 ENCOUNTER — Inpatient Hospital Stay (HOSPITAL_COMMUNITY)
Admission: EM | Admit: 2023-04-27 | Discharge: 2023-05-03 | DRG: 286 | Disposition: A | Discharge status: Home Health | Payer: Medicare Other | Attending: Internal Medicine | Admitting: Internal Medicine

## 2023-04-27 ENCOUNTER — Encounter (HOSPITAL_COMMUNITY): Payer: Self-pay

## 2023-04-27 DIAGNOSIS — Z85828 Personal history of other malignant neoplasm of skin: Secondary | ICD-10-CM | POA: Diagnosis not present

## 2023-04-27 DIAGNOSIS — J984 Other disorders of lung: Secondary | ICD-10-CM | POA: Diagnosis not present

## 2023-04-27 DIAGNOSIS — I130 Hypertensive heart and chronic kidney disease with heart failure and stage 1 through stage 4 chronic kidney disease, or unspecified chronic kidney disease: Secondary | ICD-10-CM | POA: Diagnosis not present

## 2023-04-27 DIAGNOSIS — I4510 Unspecified right bundle-branch block: Secondary | ICD-10-CM | POA: Diagnosis present

## 2023-04-27 DIAGNOSIS — J90 Pleural effusion, not elsewhere classified: Secondary | ICD-10-CM | POA: Diagnosis not present

## 2023-04-27 DIAGNOSIS — Z7902 Long term (current) use of antithrombotics/antiplatelets: Secondary | ICD-10-CM

## 2023-04-27 DIAGNOSIS — Z888 Allergy status to other drugs, medicaments and biological substances status: Secondary | ICD-10-CM

## 2023-04-27 DIAGNOSIS — E870 Hyperosmolality and hypernatremia: Secondary | ICD-10-CM

## 2023-04-27 DIAGNOSIS — R0600 Dyspnea, unspecified: Principal | ICD-10-CM

## 2023-04-27 DIAGNOSIS — Z9981 Dependence on supplemental oxygen: Secondary | ICD-10-CM

## 2023-04-27 DIAGNOSIS — Z79899 Other long term (current) drug therapy: Secondary | ICD-10-CM

## 2023-04-27 DIAGNOSIS — Z801 Family history of malignant neoplasm of trachea, bronchus and lung: Secondary | ICD-10-CM

## 2023-04-27 DIAGNOSIS — G4733 Obstructive sleep apnea (adult) (pediatric): Secondary | ICD-10-CM | POA: Diagnosis not present

## 2023-04-27 DIAGNOSIS — R531 Weakness: Secondary | ICD-10-CM | POA: Diagnosis not present

## 2023-04-27 DIAGNOSIS — E785 Hyperlipidemia, unspecified: Secondary | ICD-10-CM | POA: Diagnosis present

## 2023-04-27 DIAGNOSIS — E1165 Type 2 diabetes mellitus with hyperglycemia: Secondary | ICD-10-CM | POA: Diagnosis present

## 2023-04-27 DIAGNOSIS — Z87891 Personal history of nicotine dependence: Secondary | ICD-10-CM | POA: Diagnosis not present

## 2023-04-27 DIAGNOSIS — I10 Essential (primary) hypertension: Secondary | ICD-10-CM | POA: Diagnosis present

## 2023-04-27 DIAGNOSIS — G912 (Idiopathic) normal pressure hydrocephalus: Secondary | ICD-10-CM | POA: Diagnosis not present

## 2023-04-27 DIAGNOSIS — Z7982 Long term (current) use of aspirin: Secondary | ICD-10-CM

## 2023-04-27 DIAGNOSIS — E669 Obesity, unspecified: Secondary | ICD-10-CM | POA: Diagnosis not present

## 2023-04-27 DIAGNOSIS — J9601 Acute respiratory failure with hypoxia: Secondary | ICD-10-CM | POA: Diagnosis not present

## 2023-04-27 DIAGNOSIS — R5383 Other fatigue: Secondary | ICD-10-CM | POA: Diagnosis not present

## 2023-04-27 DIAGNOSIS — Z79890 Hormone replacement therapy (postmenopausal): Secondary | ICD-10-CM

## 2023-04-27 DIAGNOSIS — Z7984 Long term (current) use of oral hypoglycemic drugs: Secondary | ICD-10-CM

## 2023-04-27 DIAGNOSIS — J9621 Acute and chronic respiratory failure with hypoxia: Secondary | ICD-10-CM | POA: Diagnosis present

## 2023-04-27 DIAGNOSIS — T380X5A Adverse effect of glucocorticoids and synthetic analogues, initial encounter: Secondary | ICD-10-CM | POA: Diagnosis not present

## 2023-04-27 DIAGNOSIS — R0902 Hypoxemia: Secondary | ICD-10-CM | POA: Diagnosis not present

## 2023-04-27 DIAGNOSIS — I77810 Thoracic aortic ectasia: Secondary | ICD-10-CM | POA: Diagnosis not present

## 2023-04-27 DIAGNOSIS — R0602 Shortness of breath: Secondary | ICD-10-CM | POA: Diagnosis not present

## 2023-04-27 DIAGNOSIS — J9811 Atelectasis: Secondary | ICD-10-CM | POA: Diagnosis present

## 2023-04-27 DIAGNOSIS — N400 Enlarged prostate without lower urinary tract symptoms: Secondary | ICD-10-CM | POA: Diagnosis not present

## 2023-04-27 DIAGNOSIS — Z91199 Patient's noncompliance with other medical treatment and regimen due to unspecified reason: Secondary | ICD-10-CM

## 2023-04-27 DIAGNOSIS — N179 Acute kidney failure, unspecified: Secondary | ICD-10-CM | POA: Diagnosis not present

## 2023-04-27 DIAGNOSIS — R9389 Abnormal findings on diagnostic imaging of other specified body structures: Secondary | ICD-10-CM | POA: Diagnosis not present

## 2023-04-27 DIAGNOSIS — J9622 Acute and chronic respiratory failure with hypercapnia: Secondary | ICD-10-CM | POA: Diagnosis present

## 2023-04-27 DIAGNOSIS — I2510 Atherosclerotic heart disease of native coronary artery without angina pectoris: Secondary | ICD-10-CM | POA: Diagnosis present

## 2023-04-27 DIAGNOSIS — Z923 Personal history of irradiation: Secondary | ICD-10-CM

## 2023-04-27 DIAGNOSIS — D7589 Other specified diseases of blood and blood-forming organs: Secondary | ICD-10-CM | POA: Diagnosis present

## 2023-04-27 DIAGNOSIS — I2721 Secondary pulmonary arterial hypertension: Secondary | ICD-10-CM | POA: Diagnosis present

## 2023-04-27 DIAGNOSIS — E119 Type 2 diabetes mellitus without complications: Secondary | ICD-10-CM

## 2023-04-27 DIAGNOSIS — R059 Cough, unspecified: Secondary | ICD-10-CM | POA: Diagnosis not present

## 2023-04-27 DIAGNOSIS — Z1152 Encounter for screening for COVID-19: Secondary | ICD-10-CM

## 2023-04-27 DIAGNOSIS — R0689 Other abnormalities of breathing: Secondary | ICD-10-CM | POA: Diagnosis not present

## 2023-04-27 DIAGNOSIS — Z7951 Long term (current) use of inhaled steroids: Secondary | ICD-10-CM

## 2023-04-27 DIAGNOSIS — I5033 Acute on chronic diastolic (congestive) heart failure: Secondary | ICD-10-CM | POA: Diagnosis present

## 2023-04-27 DIAGNOSIS — Z833 Family history of diabetes mellitus: Secondary | ICD-10-CM

## 2023-04-27 DIAGNOSIS — Z8546 Personal history of malignant neoplasm of prostate: Secondary | ICD-10-CM

## 2023-04-27 DIAGNOSIS — E1122 Type 2 diabetes mellitus with diabetic chronic kidney disease: Secondary | ICD-10-CM | POA: Diagnosis present

## 2023-04-27 DIAGNOSIS — Z8249 Family history of ischemic heart disease and other diseases of the circulatory system: Secondary | ICD-10-CM

## 2023-04-27 DIAGNOSIS — Z982 Presence of cerebrospinal fluid drainage device: Secondary | ICD-10-CM

## 2023-04-27 DIAGNOSIS — R918 Other nonspecific abnormal finding of lung field: Secondary | ICD-10-CM | POA: Diagnosis not present

## 2023-04-27 DIAGNOSIS — Z955 Presence of coronary angioplasty implant and graft: Secondary | ICD-10-CM

## 2023-04-27 DIAGNOSIS — I700 Atherosclerosis of aorta: Secondary | ICD-10-CM | POA: Diagnosis not present

## 2023-04-27 DIAGNOSIS — Z6827 Body mass index (BMI) 27.0-27.9, adult: Secondary | ICD-10-CM

## 2023-04-27 DIAGNOSIS — R0989 Other specified symptoms and signs involving the circulatory and respiratory systems: Secondary | ICD-10-CM | POA: Diagnosis not present

## 2023-04-27 DIAGNOSIS — N1831 Chronic kidney disease, stage 3a: Secondary | ICD-10-CM | POA: Diagnosis present

## 2023-04-27 DIAGNOSIS — J209 Acute bronchitis, unspecified: Secondary | ICD-10-CM | POA: Diagnosis present

## 2023-04-27 DIAGNOSIS — I272 Pulmonary hypertension, unspecified: Secondary | ICD-10-CM | POA: Diagnosis not present

## 2023-04-27 DIAGNOSIS — Z87442 Personal history of urinary calculi: Secondary | ICD-10-CM

## 2023-04-27 LAB — RESP PANEL BY RT-PCR (RSV, FLU A&B, COVID)  RVPGX2
Influenza A by PCR: NEGATIVE
Influenza B by PCR: NEGATIVE
Resp Syncytial Virus by PCR: NEGATIVE
SARS Coronavirus 2 by RT PCR: NEGATIVE

## 2023-04-27 LAB — COMPREHENSIVE METABOLIC PANEL
ALT: 9 U/L (ref 0–44)
AST: 14 U/L — ABNORMAL LOW (ref 15–41)
Albumin: 3.3 g/dL — ABNORMAL LOW (ref 3.5–5.0)
Alkaline Phosphatase: 54 U/L (ref 38–126)
Anion gap: 8 (ref 5–15)
BUN: 22 mg/dL (ref 8–23)
CO2: 37 mmol/L — ABNORMAL HIGH (ref 22–32)
Calcium: 8.8 mg/dL — ABNORMAL LOW (ref 8.9–10.3)
Chloride: 103 mmol/L (ref 98–111)
Creatinine, Ser: 1.21 mg/dL (ref 0.61–1.24)
GFR, Estimated: 59 mL/min — ABNORMAL LOW (ref >60)
Glucose, Bld: 179 mg/dL — ABNORMAL HIGH (ref 70–99)
Potassium: 4.5 mmol/L (ref 3.5–5.1)
Sodium: 148 mmol/L — ABNORMAL HIGH (ref 135–145)
Total Bilirubin: 1.2 mg/dL — ABNORMAL HIGH (ref <1.2)
Total Protein: 5.8 g/dL — ABNORMAL LOW (ref 6.5–8.1)

## 2023-04-27 LAB — CBC
HCT: 49.3 % (ref 39.0–52.0)
Hemoglobin: 15.4 g/dL (ref 13.0–17.0)
MCH: 32.4 pg (ref 26.0–34.0)
MCHC: 31.2 g/dL (ref 30.0–36.0)
MCV: 103.6 fL — ABNORMAL HIGH (ref 80.0–100.0)
Platelets: 159 10*3/uL (ref 150–400)
RBC: 4.76 MIL/uL (ref 4.22–5.81)
RDW: 14.8 % (ref 11.5–15.5)
WBC: 7.5 10*3/uL (ref 4.0–10.5)
nRBC: 0 % (ref 0.0–0.2)

## 2023-04-27 LAB — TROPONIN I (HIGH SENSITIVITY)
Troponin I (High Sensitivity): 7 ng/L (ref <18)
Troponin I (High Sensitivity): 9 ng/L (ref <18)

## 2023-04-27 LAB — CBG MONITORING, ED: Glucose-Capillary: 123 mg/dL — ABNORMAL HIGH (ref 70–99)

## 2023-04-27 LAB — BRAIN NATRIURETIC PEPTIDE: B Natriuretic Peptide: 116.9 pg/mL — ABNORMAL HIGH (ref 0.0–100.0)

## 2023-04-27 MED ORDER — ENOXAPARIN SODIUM 40 MG/0.4ML IJ SOSY
40.0000 mg | PREFILLED_SYRINGE | INTRAMUSCULAR | Status: DC
  Administered 2023-04-28 – 2023-04-30 (×3): 40 mg via SUBCUTANEOUS
  Filled 2023-04-27 (×3): qty 0.4

## 2023-04-27 MED ORDER — INSULIN ASPART 100 UNIT/ML IJ SOLN
0.0000 [IU] | Freq: Three times a day (TID) | INTRAMUSCULAR | Status: DC
  Administered 2023-04-28 (×2): 1 [IU] via SUBCUTANEOUS
  Administered 2023-04-28: 3 [IU] via SUBCUTANEOUS
  Administered 2023-04-29: 2 [IU] via SUBCUTANEOUS
  Administered 2023-04-29 (×2): 3 [IU] via SUBCUTANEOUS
  Administered 2023-04-30 (×2): 2 [IU] via SUBCUTANEOUS
  Administered 2023-04-30 – 2023-05-01 (×2): 3 [IU] via SUBCUTANEOUS
  Administered 2023-05-02: 2 [IU] via SUBCUTANEOUS
  Administered 2023-05-02: 3 [IU] via SUBCUTANEOUS
  Administered 2023-05-03: 1 [IU] via SUBCUTANEOUS

## 2023-04-27 MED ORDER — ACETAMINOPHEN 650 MG RE SUPP: 650.0000 mg | Freq: Four times a day (QID) | RECTAL | Status: DC | PRN

## 2023-04-27 MED ORDER — ACETAMINOPHEN 325 MG PO TABS: 650.0000 mg | ORAL_TABLET | Freq: Four times a day (QID) | ORAL | Status: DC | PRN

## 2023-04-27 MED ORDER — IOHEXOL 350 MG/ML SOLN
75.0000 mL | Freq: Once | INTRAVENOUS | Status: AC | PRN
  Administered 2023-04-27: 75 mL via INTRAVENOUS

## 2023-04-27 MED ORDER — IPRATROPIUM-ALBUTEROL 0.5-2.5 (3) MG/3ML IN SOLN
3.0000 mL | Freq: Once | RESPIRATORY_TRACT | Status: AC
Start: 2023-04-27 — End: 2023-04-27
  Administered 2023-04-27: 3 mL via RESPIRATORY_TRACT
  Filled 2023-04-27: qty 3

## 2023-04-27 MED ORDER — INSULIN ASPART 100 UNIT/ML IJ SOLN
0.0000 [IU] | Freq: Every day | INTRAMUSCULAR | Status: DC
  Administered 2023-04-28: 2 [IU] via SUBCUTANEOUS
  Administered 2023-04-29: 3 [IU] via SUBCUTANEOUS
  Administered 2023-04-30: 2 [IU] via SUBCUTANEOUS
  Administered 2023-05-01: 3 [IU] via SUBCUTANEOUS
  Administered 2023-05-02: 2 [IU] via SUBCUTANEOUS

## 2023-04-27 NOTE — H&P (Signed)
History and Physical    Jeremiah [name] RUE:[id number] DOB: 04/11/1941 DOA: 04/27/2023  PCP: Rodrigo Ran, MD  Patient coming from: Home  Chief Complaint: Shortness of breath  HPI: Jeremiah [name] is a 82 y.o. male with medical history significant of CAD status post PCI, chronic hypoxemic respiratory failure on 2 L home oxygen, NPH status post VP shunt, type 2 diabetes, hyperlipidemia, hypertension, BPH, CKD stage IIIa, OSA on CPAP, history of prostate and skin cancer presenting to ED via EMS for evaluation of shortness of breath and hypoxia.  Patient has not been using his home oxygen.  Caregiver went over to his house today and his sats were in the 70s on room air and lips were blue.  When EMS arrived, he was 80% on room air.  They placed him on 3 L [place] and sats improved to mid 90s.  In the ED, patient continued to have low oxygen saturation and required 6 L [place] to maintain sats in the 90s.  Tachypneic to the 20s.  Afebrile.  Labs showing no leukocytosis, troponin negative x 2, sodium 148, bicarb 37, glucose 179, creatinine 1.2 (stable), COVID/influenza/RSV PCR negative, BNP 116. CTA chest negative for PE.  Showing moderate to marked right hemidiaphragm elevation with persistent small right pleural effusion and similar bibasilar volume loss with atelectasis and scar.  Showing pulmonary artery enlargement suggesting pulmonary arterial hypertension.  Patient was given DuoNeb treatment.  TRH called to admit.  Patient states he has been feeling short of breath for the past 1 year and has been on home oxygen.  He is supposed to be using 2 L oxygen at home but does not use it regularly.  Son at bedside confirms that patient is noncompliant with his home oxygen and CPAP.  Per son, patient lives with his wife who has dementia and they have a caregiver who visits about 3 times a week.  Patient denies increasing shortness of breath.  Denies chest pain or cough.  Denies fevers or chills.  No other  complaints.  Review of Systems:  Review of Systems  All other systems reviewed and are negative.   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   S/P radiation therapy 04/23/13 - 05/29/14   Prostate/seminal vesicles, external beam 4500 cGy in 25 sessions   Seasonal allergies    Skin cancer    scalp   Small bowel obstruction (HCC) 06/10/2016   Wears glasses     Past Surgical History:  Procedure Laterality Date   BACK SURGERY  2009   CARDIAC CATHETERIZATION     COLONOSCOPY W/ BIOPSIES AND POLYPECTOMY     CORONARY STENT INTERVENTION N/A 01/24/2022   Procedure: CORONARY STENT INTERVENTION;  Surgeon: Swaziland, [name], MD;  Location: [facility];  Service: Cardiovascular;  Laterality: N/A;   CYSTOSCOPY W/ URETERAL STENT PLACEMENT Left 05/12/2021   Procedure: CYSTOSCOPY WITH RETROGRADE PYELOGRAM/ LEFT URETERAL STENT PLACEMENT.;  Surgeon: Jannifer Hick, MD;  Location: [facility] OR;  Service: Urology;  Laterality: Left;   HYDROCELE EXCISION  1963  LEFT HEART CATH AND CORONARY ANGIOGRAPHY N/A 03/14/2019   Procedure: LEFT HEART CATH AND CORONARY ANGIOGRAPHY;  Surgeon: Kathleene Hazel, MD;  Location: [facility];  Service: Cardiovascular;  Laterality: N/A;   LEFT HEART CATH AND CORONARY ANGIOGRAPHY N/A 01/24/2022   Procedure: LEFT HEART CATH AND CORONARY ANGIOGRAPHY;  Surgeon: Swaziland, [name], MD;  Location: [facility] LAB;  Service: Cardiovascular;  Laterality: N/A;   PROSTATE BIOPSY  2013, 2014, 01/2014   Gleason 7   RADIOACTIVE SEED IMPLANT N/A 07/01/2014   Procedure: RADIOACTIVE SEED IMPLANT;  Surgeon: Valetta Fuller, MD;   Location: [facility];  Service: Urology;  Laterality: N/A;  DR PORTABLE   TONSILLECTOMY     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   Procedure: SHUNT INSERTION VENTRICULAR-PERITONEAL;  Surgeon: Tia Alert, MD;  Location: [facility] OR;  Service: Neurosurgery;  Laterality: N/A;  SHUNT INSERTION VENTRICULAR-PERITONEAL     reports that he quit smoking about 62 years ago. His smoking use included cigarettes. He has never used smokeless tobacco. He reports that he does not currently use alcohol after a past usage of about 1.0 standard drink of alcohol per week. He reports that he does not use drugs.  Allergies  Allergen Reactions   Lipitor [Atorvastatin] Other (See Comments)    Hip pain    Family History  Problem Relation Age of Onset   Heart attack Father 42   Cancer Father        prostate   Diabetes Father    Cancer Paternal Uncle        prostate   Dementia Mother 22   Lung cancer Brother     Prior to Admission medications   Medication Sig Start Date End Date Taking? Authorizing Provider  acetaminophen (TYLENOL) 500 MG tablet Take 1 tablet (500 mg total) by mouth every 6 (six) hours as needed for mild pain or moderate pain. 05/13/21   Leroy Sea, MD  aspirin EC 81 MG tablet Take 81 mg by mouth daily.     [provider]  carvedilol (COREG) 12.5 MG tablet Take 1 tablet (12.5 mg total) by mouth 2 (two) times daily. 01/25/22   Almon Hercules, MD  cetirizine (ZYRTEC) 10 MG tablet Take 10 mg by mouth daily.    [provider]  cholecalciferol (VITAMIN D3) 25 MCG (1000 UNIT) tablet Take 1,000 Units by mouth daily.    [provider]  clopidogrel (PLAVIX) 75 MG tablet Take 1 tablet (75 mg total) by mouth daily with breakfast. 02/13/22   Azalee Course, PA  Cyanocobalamin (B-12 PO) Take by mouth.    [provider]  dapagliflozin propanediol (FARXIGA) 10 MG TABS tablet Take 1 tablet (10 mg total) by mouth daily before breakfast. 01/25/22   Almon Hercules, MD  ezetimibe (ZETIA) 10 MG tablet Take 10 mg by mouth daily. 05/02/21   [provider]  meclizine (ANTIVERT) 25 MG tablet Take 1 tablet (25 mg total) by mouth 3 (three) times daily as needed for dizziness. 08/13/21   Mesner, Barbara Cower, MD  Melatonin 10 MG TABS Take 10 mg by mouth at bedtime as needed (sleep).    [provider]  metFORMIN (GLUCOPHAGE-XR) 500 MG 24 hr tablet Take 500 mg by mouth daily. 01/11/22   [provider]  pantoprazole (PROTONIX) 20 MG tablet Take 20 mg by mouth daily before breakfast.  02/19/19   [provider]  rosuvastatin (CRESTOR) 10 MG tablet Take 1 tablet (10  mg total) by mouth daily. 01/25/22 01/25/23  Almon Hercules, MD  vitamin B-12 (CYANOCOBALAMIN) 500 MCG tablet Take 500 mcg by mouth daily.    [provider]    Physical Exam: Vitals:   04/27/23 1800 04/27/23 1915 04/27/23 2030 04/27/23 2045  BP: 127/78 118/74 133/77 111/64  Pulse: 76 69 69 73  Resp: (!) 34 (!) 26 (!) 22   Temp:      TempSrc:      SpO2: 91% 92% 91% (!) 86%  Weight:      Height:        Physical Exam Vitals reviewed.  Constitutional:      General: He is not in acute distress. HENT:     Head: Normocephalic and atraumatic.  Eyes:     Extraocular Movements: Extraocular movements intact.  Cardiovascular:     Rate and Rhythm: Normal rate and regular rhythm.     Pulses: Normal pulses.  Pulmonary:     Effort: Pulmonary effort is normal. No respiratory distress.     Breath sounds: No wheezing or rales.  Abdominal:     General: Bowel sounds are normal. There is no distension.     Palpations: Abdomen is soft.     Tenderness: There is no abdominal tenderness. There is no guarding.  Musculoskeletal:     Cervical back: Normal range of motion.     Right lower leg: No edema.     Left lower leg: No edema.  Skin:    General: Skin is warm and dry.  Neurological:     General: No focal deficit present.     Mental Status: He is alert and oriented  to person, place, and time.     Labs on Admission: I have personally reviewed following labs and imaging studies  CBC: Recent Labs  Lab 04/27/23 1156  WBC 7.5  HGB 15.4  HCT 49.3  MCV 103.6*  PLT 159   Basic Metabolic Panel: Recent Labs  Lab 04/27/23 1327  NA 148*  K 4.5  CL 103  CO2 37*  GLUCOSE 179*  BUN 22  CREATININE 1.21  CALCIUM 8.8*   GFR: Estimated Creatinine Clearance: 47.8 mL/min (by C-G formula based on SCr of 1.21 mg/dL). Liver Function Tests: Recent Labs  Lab 04/27/23 1327  AST 14*  ALT 9  ALKPHOS 54  BILITOT 1.2*  PROT 5.8*  ALBUMIN 3.3*   No results for input(s): "LIPASE", "AMYLASE" in the last 168 hours. No results for input(s): "AMMONIA" in the last 168 hours. Coagulation Profile: No results for input(s): "INR", "PROTIME" in the last 168 hours. Cardiac Enzymes: No results for input(s): "CKTOTAL", "CKMB", "CKMBINDEX", "TROPONINI" in the last 168 hours. BNP (last 3 results) No results for input(s): "PROBNP" in the last 8760 hours. HbA1C: No results for input(s): "HGBA1C" in the last 72 hours. CBG: No results for input(s): "GLUCAP" in the last 168 hours. Lipid Profile: No results for input(s): "CHOL", "HDL", "LDLCALC", "TRIG", "CHOLHDL", "LDLDIRECT" in the last 72 hours. Thyroid Function Tests: No results for input(s): "TSH", "T4TOTAL", "FREET4", "T3FREE", "THYROIDAB" in the last 72 hours. Anemia Panel: No results for input(s): "VITAMINB12", "FOLATE", "FERRITIN", "TIBC", "IRON", "RETICCTPCT" in the last 72 hours. Urine analysis:    Component Value Date/Time   COLORURINE YELLOW 01/27/2022 1907   APPEARANCEUR CLEAR 01/27/2022 1907   LABSPEC 1.025 01/27/2022 1907   PHURINE 5.0 01/27/2022 1907   GLUCOSEU >=500 (A) 01/27/2022 1907   HGBUR NEGATIVE 01/27/2022 1907   BILIRUBINUR NEGATIVE 01/27/2022 1907  KETONESUR NEGATIVE 01/27/2022 1907   PROTEINUR NEGATIVE 01/27/2022 1907   UROBILINOGEN 1.0 08/25/2008 0326   NITRITE NEGATIVE  01/27/2022 1907   LEUKOCYTESUR NEGATIVE 01/27/2022 1907    Radiological Exams on Admission: CT Angio Chest PE W and/or Wo Contrast  Result Date: 04/27/2023 CLINICAL DATA:  Shortness of breath and weakness EXAM: CT ANGIOGRAPHY CHEST WITH CONTRAST TECHNIQUE: Multidetector CT imaging of the chest was performed using the standard protocol during bolus administration of intravenous contrast. Multiplanar CT image reconstructions and MIPs were obtained to evaluate the vascular anatomy. RADIATION DOSE REDUCTION: This exam was performed according to the departmental dose-optimization program which includes automated exposure control, adjustment of the mA and/or kV according to patient size and/or use of iterative reconstruction technique. CONTRAST:  75mL OMNIPAQUE IOHEXOL 350 MG/ML SOLN COMPARISON:  Plain film of earlier today.  CTA chest 01/27/2022. FINDINGS: Cardiovascular: The quality of this exam for evaluation of pulmonary embolism is moderate. The bolus is centered in the [facility] to the and there is minimal motion degradation. No evidence of pulmonary embolism. Aortic atherosclerosis. Mild ascending aortic dilatation including at 4.2 cm, similar. Tortuous descending thoracic aorta. Mild cardiomegaly, without pericardial effusion. Lad and right coronary artery calcification. Pulmonary artery enlargement, outflow tract 3.7 cm. Mediastinum/Nodes: No mediastinal or hilar adenopathy. Lungs/Pleura: Small right pleural effusion, minimally increased. Moderate right hemidiaphragm elevation. VP shunt catheter again identified about the right chest wall, terminating in the right pleural space inferiorly. Right greater than left base volume loss and airspace disease, all favored to represent atelectasis and scar. Upper Abdomen: small dependent gallstones. Normal imaged portions of the liver, spleen, stomach, pancreas, adrenal glands. Musculoskeletal: No acute osseous abnormality. Review of the MIP images confirms the above  findings. IMPRESSION: 1. No pulmonary embolism with above limitations. 2. Moderate to marked right hemidiaphragm elevation with persistent small right pleural effusion and similar bibasilar volume loss with atelectasis and scar. 3. Similar mild ascending aortic dilatation at 4.2 cm. Recommend annual imaging followup by CTA or MRA. This recommendation follows 2010 ACCF/AHA/AATS/ACR/ASA/SCA/SCAI/SIR/STS/SVM Guidelines for the Diagnosis and Management of Patients with Thoracic Aortic Disease. Circulation. 2010; 121: Z610-R604. Aortic aneurysm NOS (ICD10-I71.9) 4. Pulmonary artery enlargement suggests pulmonary arterial hypertension. 5. Cholelithiasis 6. Aortic Atherosclerosis (ICD10-I70.0). Coronary artery atherosclerosis. Electronically Signed   By: Jeronimo Greaves M.D.   On: 04/27/2023 17:34   DG Chest Port 1 View  Result Date: 04/27/2023 CLINICAL DATA:  Shortness of breath.  Fatigue. EXAM: PORTABLE CHEST 1 VIEW COMPARISON:  01/27/2022. FINDINGS: Low lung volume. There are probable atelectatic changes/scarring at the lung bases. No acute consolidation or lung collapse. Apparent blunting of bilateral lateral costophrenic angles may be due to trace pleural effusion versus superimposed soft tissue. No pneumothorax. Stable cardio-mediastinal silhouette. No acute osseous abnormalities. The soft tissues are within normal limits. Presumed VP shunt tubing noted along the right lateral chest. IMPRESSION: *Bibasilar atelectasis/scarring. No acute consolidation or lung collapse. Electronically Signed   By: Jules Schick M.D.   On: 04/27/2023 14:24    EKG: Independently reviewed.  Sinus rhythm, RBBB, baseline wander.  Assessment and Plan  Acute on chronic hypoxemic respiratory failure Patient is supposed to be on 2 L home oxygen but noncompliant.  Sats down to the 70s on room air.  Currently requiring 6 L [place] to maintain sats in the 90s.  Lungs clear on exam and no respiratory distress.  COVID/influenza/RSV PCR negative.   BNP 116. CTA chest negative for PE.  Showing moderate to marked right hemidiaphragm elevation with persistent  small right pleural effusion and similar bibasilar volume loss with atelectasis and scar.  Also showing pulmonary artery enlargement suggesting pulmonary arterial hypertension.  Last echo from over a year ago showing normal EF, mildly reduced RV systolic function, mild MVR, and mild to moderate AVR.  Repeat echocardiogram ordered for further evaluation. Continue supplemental oxygen.  Continue CPAP at night given history of OSA.  Borderline hypernatremia Continue to monitor labs.  Mild ascending aortic dilation CT showing similar mild ascending aortic dilation at 4.2 cm.  Radiologist recommending annual imaging follow-up with CTA or MRA.  Type 2 diabetes Glucose in the 170s.  Last A1c 7.9 in September 2023, repeat ordered.  Placed on sensitive sliding scale insulin ACHS.  CKD stage IIIa Creatinine 1.2, stable.  OSA Continue nightly CPAP.  CAD status post PCI in September 2023: Not endorsing chest pain. Hypertension: Blood pressure currently stable. Hyperlipidemia Continue home medications after pharmacy med rec is done.  DVT prophylaxis: Lovenox Code Status: Full Code (discussed with the patient) Family Communication: Son at bedside. Level of care: Progressive Care Unit Admission status: It is my clinical opinion that referral for OBSERVATION is reasonable and necessary in this patient based on the above information provided. The aforementioned taken together are felt to place the patient at high risk for further clinical deterioration. However, it is anticipated that the patient may be medically stable for discharge from the hospital within 24 to 48 hours.  John Giovanni MD [facility]  If 7PM-7AM, please contact night-coverage [email or web address]  04/27/2023, 9:16 PM

## 2023-04-27 NOTE — Telephone Encounter (Signed)
Jeremiah [name], pt cargiver, states his O2 levels are around 76%. She states pt is slurring words, fatigue, and SOB with exertion around the house. She states when she asks the pt how he feels he tells her he's fine but she's noticed some changes and expressed concern. She states she wasn't sure if she should reach out to Dr. [place] or his PCP. Please advise.

## 2023-04-27 NOTE — ED Notes (Signed)
ED TO INPATIENT HANDOFF REPORT  ED Nurse Name and Phone #: chris [phone]  S Name/Age/Gender Jeremiah [name] 82 y.o. male Room/Bed: 035C/035C  Code Status   Code Status: Prior  Home/SNF/Other Home Patient oriented to: self, place, and situation Is this baseline? Yes   Triage Complete: Triage complete  Chief Complaint Acute hypoxemic respiratory failure (HCC) [J96.01]  Triage Note Pt arrived via [facility] from home for SOB and weakness3wks. Pt is supposed to wear 2L 02 per [place] at baseline, but is non-compliant. Per EMS, when fire got there pt was 80% on RA, pale, lips were blue. Per EMS, pt has decreased lung sound on left side. Pt c/o nasal congestion, runny nosex3wks.      Allergies Allergies  Allergen Reactions   Lipitor [Atorvastatin] Other (See Comments)    Hip pain    Level of Care/Admitting Diagnosis ED Disposition     ED Disposition  Admit   Condition  --   Comment  Hospital Area: [facility] [[id number]]  Level of Care: Progressive [102]  Admit to Progressive based on following criteria: RESPIRATORY PROBLEMS hypoxemic/hypercapnic respiratory failure that is responsive to NIPPV (BiPAP) or High Flow Nasal Cannula (6-80 lpm). Frequent assessment/intervention, no > Q2 hrs < Q4 hrs, to maintain oxygenation and pulmonary hygiene.  May place patient in observation at [facility] or [facility] if equivalent level of care is available:: Yes  Covid Evaluation: Asymptomatic - no recent exposure (last 10 days) testing not required  Diagnosis: Acute hypoxemic respiratory failure [facility]) [[id number]]  Admitting Physician: John Giovanni [[id number]]  Attending Physician: John Giovanni [[id number]]          B Medical/Surgery History Past Medical History:  Diagnosis Date   BPH (benign prostatic hyperplasia)    Central retinal artery occlusion    Coronary artery disease    Diabetes mellitus without complication (HCC)    diet controlled   Diverticulosis     History of kidney stones    Hyperlipidemia    Hypertension    OSA on CPAP    wears cpap   Osteoarthritis    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, 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   S/P radiation therapy 04/23/13 - 05/29/14   Prostate/seminal vesicles, external beam 4500 cGy in 25 sessions   Seasonal allergies    Skin cancer    scalp   Small bowel obstruction (HCC) 06/10/2016   Wears glasses    Past Surgical History:  Procedure Laterality Date   BACK SURGERY  2009   CARDIAC CATHETERIZATION     COLONOSCOPY W/ BIOPSIES AND POLYPECTOMY     CORONARY STENT INTERVENTION N/A 01/24/2022   Procedure: CORONARY STENT INTERVENTION;  Surgeon: Swaziland, [name], MD;  Location: [facility] INVASIVE CV LAB;  Service: Cardiovascular;  Laterality: N/A;   CYSTOSCOPY W/ URETERAL STENT PLACEMENT Left 05/12/2021   Procedure: CYSTOSCOPY WITH RETROGRADE PYELOGRAM/ LEFT URETERAL STENT PLACEMENT.;  Surgeon: Jannifer Hick, MD;  Location: [facility] OR;  Service: Urology;  Laterality: Left;   HYDROCELE EXCISION  1963   LEFT HEART CATH AND CORONARY ANGIOGRAPHY N/A 03/14/2019   Procedure: LEFT HEART CATH AND CORONARY ANGIOGRAPHY;  Surgeon: Kathleene Hazel, MD;  Location: [facility] INVASIVE CV LAB;  Service: Cardiovascular;  Laterality: N/A;   LEFT HEART CATH AND CORONARY ANGIOGRAPHY N/A 01/24/2022   Procedure: LEFT HEART CATH AND CORONARY ANGIOGRAPHY;  Surgeon: Swaziland, [name], MD;  Location: [facility] INVASIVE CV LAB;  Service: Cardiovascular;  Laterality: N/A;   PROSTATE BIOPSY  2013,  2014, 01/2014   Gleason 7   RADIOACTIVE SEED IMPLANT N/A 07/01/2014   Procedure: RADIOACTIVE SEED IMPLANT;  Surgeon: Valetta Fuller, MD;  Location: [facility];  Service: Urology;  Laterality: N/A;  DR PORTABLE   TONSILLECTOMY     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   Procedure: SHUNT INSERTION VENTRICULAR-PERITONEAL;  Surgeon: Tia Alert, MD;  Location: [facility] OR;  Service: Neurosurgery;  Laterality: N/A;  SHUNT INSERTION VENTRICULAR-PERITONEAL      A IV Location/Drains/Wounds Patient Lines/Drains/Airways Status     Active Line/Drains/Airways     Name Placement date Placement time Site Days   Peripheral IV 04/27/23 20 G Left Antecubital 04/27/23  --  Antecubital  less than 1            Intake/Output Last 24 hours No intake or output data in the 24 hours ending 04/27/23 2110  Labs/Imaging Results for orders placed or performed during the hospital encounter of 04/27/23 (from the past 48 hour(s))  Brain natriuretic peptide     Status: Abnormal   Collection Time: 04/27/23 11:55 AM  Result Value Ref Range   B Natriuretic Peptide 116.9 (H) 0.0 - 100.0 pg/mL    Comment: Performed at [facility], [address], [place], [place] [address]  Resp panel by RT-PCR (RSV, Flu A&B, Covid) Anterior Nasal Swab     Status: None   Collection Time: 04/27/23 11:56 AM   Specimen: Anterior Nasal Swab  Result Value Ref Range   SARS Coronavirus 2 by RT PCR NEGATIVE NEGATIVE   Influenza A by PCR NEGATIVE NEGATIVE   Influenza B by PCR NEGATIVE NEGATIVE    Comment: (NOTE) The Xpert Xpress SARS-CoV-2/FLU/RSV plus assay is intended as an aid in the diagnosis of influenza from Nasopharyngeal swab specimens and should not be used as a sole basis for treatment. Nasal washings and aspirates are unacceptable for Xpert Xpress SARS-CoV-2/FLU/RSV testing.  Fact Sheet for Patients: BloggerCourse.com  Fact Sheet for Healthcare Providers: SeriousBroker.it  This test is not yet approved or cleared by the [place] FDA and has been authorized for detection and/or diagnosis of SARS-CoV-2 by FDA under an Emergency Use Authorization (EUA). This EUA will remain in effect (meaning this test can be used) for the duration of the COVID-19 declaration under Section 564(b)(1) of the Act, 21 U.S.C. section 360bbb-3(b)(1), unless the authorization is terminated or revoked.     Resp Syncytial Virus  by PCR NEGATIVE NEGATIVE    Comment: (NOTE) Fact Sheet for Patients: BloggerCourse.com  Fact Sheet for Healthcare Providers: SeriousBroker.it  This test is not yet approved or cleared by the [place] FDA and has been authorized for detection and/or diagnosis of SARS-CoV-2 by FDA under an Emergency Use Authorization (EUA). This EUA will remain in effect (meaning this test can be used) for the duration of the COVID-19 declaration under Section 564(b)(1) of the Act, 21 U.S.C. section 360bbb-3(b)(1), unless the authorization is terminated or revoked.  Performed at [facility], [address], [place], [place] [address]   CBC     Status: Abnormal   Collection Time: 04/27/23 11:56 AM  Result Value Ref Range   WBC 7.5 4.0 - 10.5 K/uL   RBC 4.76 4.22 - 5.81 MIL/uL   Hemoglobin 15.4 13.0 - 17.0 g/dL   HCT 64.4 03.4 - 74.2 %   MCV 103.6 (H) 80.0 - 100.0 fL   MCH 32.4 26.0 - 34.0 pg   MCHC 31.2 30.0 - 36.0 g/dL  RDW 14.8 11.5 - 15.5 %   Platelets 159 150 - 400 K/uL   nRBC 0.0 0.0 - 0.2 %    Comment: Performed at [facility], [address], [place], [place] [address]  Troponin I (High Sensitivity)     Status: None   Collection Time: 04/27/23 12:10 PM  Result Value Ref Range   Troponin I (High Sensitivity) 7 <18 ng/L    Comment: (NOTE) Elevated high sensitivity troponin I (hsTnI) values and significant  changes across serial measurements may suggest ACS but many other  chronic and acute conditions are known to elevate hsTnI results.  Refer to the "Links" section for chest pain algorithms and additional  guidance. Performed at [facility], [address] [place], [place] [address]   Comprehensive metabolic panel     Status: Abnormal   Collection Time: 04/27/23  1:27 PM  Result Value Ref Range   Sodium 148 (H) 135 - 145 mmol/L   Potassium 4.5 3.5 - 5.1 mmol/L   Chloride 103 98 - 111 mmol/L   CO2 37 (H)  22 - 32 mmol/L   Glucose, Bld 179 (H) 70 - 99 mg/dL    Comment: Glucose reference range applies only to samples taken after fasting for at least 8 hours.   BUN 22 8 - 23 mg/dL   Creatinine, Ser 0.98 0.61 - 1.24 mg/dL   Calcium 8.8 (L) 8.9 - 10.3 mg/dL   Total Protein 5.8 (L) 6.5 - 8.1 g/dL   Albumin 3.3 (L) 3.5 - 5.0 g/dL   AST 14 (L) 15 - 41 U/L   ALT 9 0 - 44 U/L   Alkaline Phosphatase 54 38 - 126 U/L   Total Bilirubin 1.2 (H) <1.2 mg/dL   GFR, Estimated 59 (L) >60 mL/min    Comment: (NOTE) Calculated using the CKD-EPI Creatinine Equation (2021)    Anion gap 8 5 - 15    Comment: Performed at [facility], [address] [place], [place] [address]  Troponin I (High Sensitivity)     Status: None   Collection Time: 04/27/23  1:27 PM  Result Value Ref Range   Troponin I (High Sensitivity) 9 <18 ng/L    Comment: (NOTE) Elevated high sensitivity troponin I (hsTnI) values and significant  changes across serial measurements may suggest ACS but many other  chronic and acute conditions are known to elevate hsTnI results.  Refer to the "Links" section for chest pain algorithms and additional  guidance. Performed at [facility], [address] [place], [place] [address]    CT Angio Chest PE W and/or Wo Contrast  Result Date: 04/27/2023 CLINICAL DATA:  Shortness of breath and weakness EXAM: CT ANGIOGRAPHY CHEST WITH CONTRAST TECHNIQUE: Multidetector CT imaging of the chest was performed using the standard protocol during bolus administration of intravenous contrast. Multiplanar CT image reconstructions and MIPs were obtained to evaluate the vascular anatomy. RADIATION DOSE REDUCTION: This exam was performed according to the departmental dose-optimization program which includes automated exposure control, adjustment of the mA and/or kV according to patient size and/or use of iterative reconstruction technique. CONTRAST:  75mL OMNIPAQUE IOHEXOL 350 MG/ML SOLN COMPARISON:  Plain  film of earlier today.  CTA chest 01/27/2022. FINDINGS: Cardiovascular: The quality of this exam for evaluation of pulmonary embolism is moderate. The bolus is centered in the [facility] to the and there is minimal motion degradation. No evidence of pulmonary embolism. Aortic atherosclerosis. Mild ascending aortic dilatation including at  4.2 cm, similar. Tortuous descending thoracic aorta. Mild cardiomegaly, without pericardial effusion. Lad and right coronary artery calcification. Pulmonary artery enlargement, outflow tract 3.7 cm. Mediastinum/Nodes: No mediastinal or hilar adenopathy. Lungs/Pleura: Small right pleural effusion, minimally increased. Moderate right hemidiaphragm elevation. VP shunt catheter again identified about the right chest wall, terminating in the right pleural space inferiorly. Right greater than left base volume loss and airspace disease, all favored to represent atelectasis and scar. Upper Abdomen: small dependent gallstones. Normal imaged portions of the liver, spleen, stomach, pancreas, adrenal glands. Musculoskeletal: No acute osseous abnormality. Review of the MIP images confirms the above findings. IMPRESSION: 1. No pulmonary embolism with above limitations. 2. Moderate to marked right hemidiaphragm elevation with persistent small right pleural effusion and similar bibasilar volume loss with atelectasis and scar. 3. Similar mild ascending aortic dilatation at 4.2 cm. Recommend annual imaging followup by CTA or MRA. This recommendation follows 2010 ACCF/AHA/AATS/ACR/ASA/SCA/SCAI/SIR/STS/SVM Guidelines for the Diagnosis and Management of Patients with Thoracic Aortic Disease. Circulation. 2010; 121: G956-O130. Aortic aneurysm NOS (ICD10-I71.9) 4. Pulmonary artery enlargement suggests pulmonary arterial hypertension. 5. Cholelithiasis 6. Aortic Atherosclerosis (ICD10-I70.0). Coronary artery atherosclerosis. Electronically Signed   By: Jeronimo Greaves M.D.   On: 04/27/2023 17:34   DG Chest Port 1  View  Result Date: 04/27/2023 CLINICAL DATA:  Shortness of breath.  Fatigue. EXAM: PORTABLE CHEST 1 VIEW COMPARISON:  01/27/2022. FINDINGS: Low lung volume. There are probable atelectatic changes/scarring at the lung bases. No acute consolidation or lung collapse. Apparent blunting of bilateral lateral costophrenic angles may be due to trace pleural effusion versus superimposed soft tissue. No pneumothorax. Stable cardio-mediastinal silhouette. No acute osseous abnormalities. The soft tissues are within normal limits. Presumed VP shunt tubing noted along the right lateral chest. IMPRESSION: *Bibasilar atelectasis/scarring. No acute consolidation or lung collapse. Electronically Signed   By: Jules Schick M.D.   On: 04/27/2023 14:24    Pending Labs Unresulted Labs (From admission, onward)    None       Vitals/Pain Today's Vitals   04/27/23 1906 04/27/23 1915 04/27/23 1930 04/27/23 2030  BP:  118/74  133/77  Pulse:  69  69  Resp:  (!) 26  (!) 22  Temp:      TempSrc:      SpO2:  92%  91%  Weight:      Height:      PainSc: 0-No pain  0-No pain     Isolation Precautions No active isolations  Medications Medications  ipratropium-albuterol (DUONEB) 0.5-2.5 (3) MG/3ML nebulizer solution 3 mL (3 mLs Nebulization Given 04/27/23 1214)  iohexol (OMNIPAQUE) 350 MG/ML injection 75 mL (75 mLs Intravenous Contrast Given 04/27/23 1626)    Mobility walks     Focused Assessments Cardiac Assessment Handoff:  Cardiac Rhythm: Sinus tachycardia Lab Results  Component Value Date   CKTOTAL 358 08/23/2020   Lab Results  Component Value Date   DDIMER 0.87 (H) 01/27/2022   Does the Patient currently have chest pain? No    R Recommendations: See Admitting Provider Note  Report given to:   Additional Notes:

## 2023-04-27 NOTE — ED Notes (Signed)
Pt awake and walking around he reports that he was cold and uncomfortable  bed changed hooked him back up to his monitor etc  02 sats 78 no o2 on    o2 replaced and running at 5 liters nasal   pt alert and oriented x 2

## 2023-04-27 NOTE — ED Notes (Signed)
The pt has gotten up x 2 and pulled off all his leads and walked to the br  whenever he takes his 02 off his sats  are in the high  70s or 80s  at present his 02 is on 6 liters

## 2023-04-27 NOTE — ED Notes (Signed)
The pt is sleeping his sats are in the high 80s on 5 liters of  nasal 02  oxygen increased to 6 liters of nasal 02

## 2023-04-27 NOTE — ED Provider Notes (Signed)
[place] EMERGENCY DEPARTMENT AT [facility] Provider Note   CSN: [id number] Arrival date & time: 04/27/23  1138     History  Chief Complaint  Patient presents with   Shortness of Breath   Weakness    Jeremiah [name] is a 82 y.o. male.  Pt is a 82 yo male with pmhx significant for prostate cancer, hld, htn, oa, skin cancer, dm, sleep apnea, and CAD.  Pt lives at home with his wife.  He has been having increased sob.  He has not been wearing his home O2 which he's supposed to wear at 2L.  His caregiver went over to his house today and said O2 sat was 76 on RA.  She put him on his oxygen and called his cardiologist.  She was advised to have him come to the hospital.  They called EMS and he was 80% on RA.  He was put on 3L and sats are in the mid-90s.  Pt has had a runny nose, but otherwise feels his baseline.        Home Medications Prior to Admission medications   Medication Sig Start Date End Date Taking? Authorizing Provider  acetaminophen (TYLENOL) 500 MG tablet Take 1 tablet (500 mg total) by mouth every 6 (six) hours as needed for mild pain or moderate pain. 05/13/21   Leroy Sea, MD  aspirin EC 81 MG tablet Take 81 mg by mouth daily.     [provider]  carvedilol (COREG) 12.5 MG tablet Take 1 tablet (12.5 mg total) by mouth 2 (two) times daily. 01/25/22   Almon Hercules, MD  cetirizine (ZYRTEC) 10 MG tablet Take 10 mg by mouth daily.    [provider]  cholecalciferol (VITAMIN D3) 25 MCG (1000 UNIT) tablet Take 1,000 Units by mouth daily.    [provider]  clopidogrel (PLAVIX) 75 MG tablet Take 1 tablet (75 mg total) by mouth daily with breakfast. 02/13/22   Azalee Course, PA  Cyanocobalamin (B-12 PO) Take by mouth.    [provider]  dapagliflozin propanediol (FARXIGA) 10 MG TABS tablet Take 1 tablet (10 mg total) by mouth daily before breakfast. 01/25/22   Almon Hercules, MD  ezetimibe (ZETIA) 10 MG tablet Take 10 mg by  mouth daily. 05/02/21   [provider]  meclizine (ANTIVERT) 25 MG tablet Take 1 tablet (25 mg total) by mouth 3 (three) times daily as needed for dizziness. 08/13/21   Mesner, Barbara Cower, MD  Melatonin 10 MG TABS Take 10 mg by mouth at bedtime as needed (sleep).    [provider]  metFORMIN (GLUCOPHAGE-XR) 500 MG 24 hr tablet Take 500 mg by mouth daily. 01/11/22   [provider]  pantoprazole (PROTONIX) 20 MG tablet Take 20 mg by mouth daily before breakfast.  02/19/19   [provider]  rosuvastatin (CRESTOR) 10 MG tablet Take 1 tablet (10 mg total) by mouth daily. 01/25/22 01/25/23  Almon Hercules, MD  vitamin B-12 (CYANOCOBALAMIN) 500 MCG tablet Take 500 mcg by mouth daily.    [provider]      Allergies    Lipitor [atorvastatin]    Review of Systems   Review of Systems  Respiratory:  Positive for shortness of breath.   All other systems reviewed and are negative.   Physical Exam Updated Vital Signs BP (!) 159/90   Pulse 71   Temp 98.5 F (36.9 C) (Oral)   Resp 17   Ht 5'  10" (1.778 m)   Wt 86.6 kg   SpO2 (!) 89%   BMI 27.39 kg/m  Physical Exam Vitals and nursing note reviewed.  Constitutional:      Appearance: He is well-developed.  HENT:     Head: Normocephalic and atraumatic.     Mouth/Throat:     Mouth: Mucous membranes are moist.     Pharynx: Oropharynx is clear.  Eyes:     Extraocular Movements: Extraocular movements intact.     Pupils: Pupils are equal, round, and reactive to light.  Cardiovascular:     Rate and Rhythm: Normal rate and regular rhythm.  Pulmonary:     Breath sounds: Decreased breath sounds present.  Abdominal:     General: Bowel sounds are normal.     Palpations: Abdomen is soft.  Musculoskeletal:        General: Normal range of motion.     Cervical back: Normal range of motion and neck supple.  Skin:    General: Skin is warm.     Capillary Refill: Capillary refill takes less than 2 seconds.   Neurological:     General: No focal deficit present.     Mental Status: He is alert and oriented to person, place, and time.  Psychiatric:        Mood and Affect: Mood normal.        Behavior: Behavior normal.     ED Results / Procedures / Treatments   Labs (all labs ordered are listed, but only abnormal results are displayed) Labs Reviewed  CBC - Abnormal; Notable for the following components:      Result Value   MCV 103.6 (*)    All other components within normal limits  BRAIN NATRIURETIC PEPTIDE - Abnormal; Notable for the following components:   B Natriuretic Peptide 116.9 (*)    All other components within normal limits  COMPREHENSIVE METABOLIC PANEL - Abnormal; Notable for the following components:   Sodium 148 (*)    CO2 37 (*)    Glucose, Bld 179 (*)    Calcium 8.8 (*)    Total Protein 5.8 (*)    Albumin 3.3 (*)    AST 14 (*)    Total Bilirubin 1.2 (*)    GFR, Estimated 59 (*)    All other components within normal limits  RESP PANEL BY RT-PCR (RSV, FLU A&B, COVID)  [id number]  TROPONIN I (HIGH SENSITIVITY)  TROPONIN I (HIGH SENSITIVITY)    EKG None  Radiology DG Chest Port 1 View  Result Date: 04/27/2023 CLINICAL DATA:  Shortness of breath.  Fatigue. EXAM: PORTABLE CHEST 1 VIEW COMPARISON:  01/27/2022. FINDINGS: Low lung volume. There are probable atelectatic changes/scarring at the lung bases. No acute consolidation or lung collapse. Apparent blunting of bilateral lateral costophrenic angles may be due to trace pleural effusion versus superimposed soft tissue. No pneumothorax. Stable cardio-mediastinal silhouette. No acute osseous abnormalities. The soft tissues are within normal limits. Presumed VP shunt tubing noted along the right lateral chest. IMPRESSION: *Bibasilar atelectasis/scarring. No acute consolidation or lung collapse. Electronically Signed   By: Jules Schick M.D.   On: 04/27/2023 14:24    Procedures Procedures    Medications Ordered in  ED Medications  ipratropium-albuterol (DUONEB) 0.5-2.5 (3) MG/3ML nebulizer solution 3 mL (3 mLs Nebulization Given 04/27/23 1214)    ED Course/ Medical Decision Making/ A&P  Medical Decision Making Amount and/or Complexity of Data Reviewed Labs: ordered. Radiology: ordered.  Risk Prescription drug management.   This patient presents to the ED for concern of sob, this involves an extensive number of treatment options, and is a complaint that carries with it a high risk of complications and morbidity.  The differential diagnosis includes pna, covid/flu/rsv, chf, copd   Co morbidities that complicate the patient evaluation  hld, htn, oa, skin cancer, dm, sleep apnea, and CAD   Additional history obtained:  Additional history obtained from epic chart review External records from outside source obtained and reviewed including EMS report   Lab Tests:  I Ordered, and personally interpreted labs.  The pertinent results include:  cbc nl, bmp nl, covid/flu/rsv neg; bnp 116.9, trop nl   Imaging Studies ordered:  I ordered imaging studies including cxr and ct chest I independently visualized and interpreted imaging which showed  CXR: Bibasilar atelectasis/scarring. No acute consolidation or lung  collapse.  CT chest is pending at shift change I agree with the radiologist interpretation   Cardiac Monitoring:  The patient was maintained on a cardiac monitor.  I personally viewed and interpreted the cardiac monitored which showed an underlying rhythm of: nsr   Medicines ordered and prescription drug management:  I ordered medication including duoneb  for sx  Reevaluation of the patient after these medicines showed that the patient improved I have reviewed the patients home medicines and have made adjustments as needed   Test Considered:  ct   Problem List / ED Course:  SOB:  oxygen sats have improved with his normal oxygen  requirement.  CT chest pending at shift change.   Reevaluation:  After the interventions noted above, I reevaluated the patient and found that they have :improved   Social Determinants of Health:  Lives at home   Dispostion:  Pending CT chest        Final Clinical Impression(s) / ED Diagnoses Final diagnoses:  Dyspnea, unspecified type    Rx / DC Orders ED Discharge Orders     None         Jacalyn Lefevre, MD 04/27/23 1517

## 2023-04-27 NOTE — ED Provider Notes (Signed)
3:32 PM Patient signed out to me by previous ED physician. Pt is a 82 yo male presenting to Ed after his home nurse found him to be hypoxic at home with his home oxygen of 2L was off..blue lips. EMS had concerns for decreased  left lung sounds. Hx of sinus cold symptoms.   Cxr stable and without pneumothorax or pneumonia. CTA pending.   Physical Exam  BP (!) 159/90   Pulse 71   Temp 98.5 F (36.9 C) (Oral)   Resp 17   Ht 5\' 10"  (1.778 m)   Wt 86.6 kg   SpO2 (!) 89%   BMI 27.39 kg/m   Physical Exam  Procedures  .Critical Care  Performed by: Franne Forts, DO Authorized by: Franne Forts, DO   Critical care provider statement:    Critical care time (minutes):  30   Critical care was necessary to treat or prevent imminent or life-threatening deterioration of the following conditions:  Respiratory failure   Critical care was time spent personally by me on the following activities:  Development of treatment plan with patient or surrogate, discussions with consultants, evaluation of patient's response to treatment, examination of patient, ordering and review of laboratory studies, ordering and review of radiographic studies, ordering and performing treatments and interventions, pulse oximetry, re-evaluation of patient's condition and review of old charts   ED Course / MDM    Medical Decision Making Amount and/or Complexity of Data Reviewed Labs: ordered. Radiology: ordered.  Risk Prescription drug management. Decision regarding hospitalization.   Patient becoming hypoxic on his home dose of 2 L nasal cannula.  Currently satting at 96% on 2 L.  Patient placed on 4 L and still hypoxic at 89% while resting in bed speaking.  Currently on 5 L nasal cannula.  CT PE demonstrates no pulmonary embolism.  No pneumonia.  No pneumothorax.  Mall right sided pleural effusion likely secondary to pulmonary hypertension.  No previous diagnosis of pulmonary hypertension.  Recommending admission  for hypoxia and consultation to cardiology and pulmonology.   Hospitalist team accepting. Consult to cards placed.     Franne Forts, DO 04/27/23 2047

## 2023-04-27 NOTE — Telephone Encounter (Signed)
Called and spoke to [facility] patient's caregiver. She reports that yesterday when she got to patient's home he did not have his oxygen on and was SOB, fatigue, slurring his speech and having some confusion. His O2 sat at that time was 76% on RA. She stated she put his oxygen on and his SATs did come up to 93% at it's highest on 2 liters. She was not able to give me BP readings or HR because she was not there with patient at this time. Advised her patient needs to be seen in the ED for evaluation and also recommended that she call 911 for transport. She verbalized understanding and agree. Also called patient's daughter to advise her of my recommendation. She also verbalized understanding and reported the sitter was on the way over there with patient. They will call EMS for transport and stated she will likely go to [facility].

## 2023-04-27 NOTE — ED Triage Notes (Signed)
Pt arrived via [facility] from home for SOB and weakness3wks. Pt is supposed to wear 2L 02 per [place] at baseline, but is non-compliant. Per EMS, when fire got there pt was 80% on RA, pale, lips were blue. Per EMS, pt has decreased lung sound on left side. Pt c/o nasal congestion, runny nosex3wks.

## 2023-04-27 NOTE — ED Notes (Signed)
Admitting doctor at the bedside 

## 2023-04-27 NOTE — ED Notes (Signed)
The pts son has gone to eat he will return

## 2023-04-28 ENCOUNTER — Other Ambulatory Visit: Payer: Self-pay | Admitting: Physician Assistant

## 2023-04-28 ENCOUNTER — Observation Stay (HOSPITAL_COMMUNITY): Payer: Medicare Other

## 2023-04-28 DIAGNOSIS — Z9981 Dependence on supplemental oxygen: Secondary | ICD-10-CM | POA: Diagnosis not present

## 2023-04-28 DIAGNOSIS — R0902 Hypoxemia: Secondary | ICD-10-CM

## 2023-04-28 DIAGNOSIS — Z87891 Personal history of nicotine dependence: Secondary | ICD-10-CM | POA: Diagnosis not present

## 2023-04-28 DIAGNOSIS — I2721 Secondary pulmonary arterial hypertension: Secondary | ICD-10-CM | POA: Diagnosis present

## 2023-04-28 DIAGNOSIS — J9621 Acute and chronic respiratory failure with hypoxia: Secondary | ICD-10-CM | POA: Diagnosis present

## 2023-04-28 DIAGNOSIS — J9622 Acute and chronic respiratory failure with hypercapnia: Secondary | ICD-10-CM | POA: Diagnosis present

## 2023-04-28 DIAGNOSIS — N179 Acute kidney failure, unspecified: Secondary | ICD-10-CM | POA: Diagnosis not present

## 2023-04-28 DIAGNOSIS — J90 Pleural effusion, not elsewhere classified: Secondary | ICD-10-CM | POA: Diagnosis present

## 2023-04-28 DIAGNOSIS — E785 Hyperlipidemia, unspecified: Secondary | ICD-10-CM | POA: Diagnosis present

## 2023-04-28 DIAGNOSIS — I77810 Thoracic aortic ectasia: Secondary | ICD-10-CM | POA: Diagnosis present

## 2023-04-28 DIAGNOSIS — Z1152 Encounter for screening for COVID-19: Secondary | ICD-10-CM | POA: Diagnosis not present

## 2023-04-28 DIAGNOSIS — I272 Pulmonary hypertension, unspecified: Secondary | ICD-10-CM

## 2023-04-28 DIAGNOSIS — N400 Enlarged prostate without lower urinary tract symptoms: Secondary | ICD-10-CM | POA: Diagnosis present

## 2023-04-28 DIAGNOSIS — J9811 Atelectasis: Secondary | ICD-10-CM | POA: Diagnosis present

## 2023-04-28 DIAGNOSIS — Z85828 Personal history of other malignant neoplasm of skin: Secondary | ICD-10-CM | POA: Diagnosis not present

## 2023-04-28 DIAGNOSIS — T380X5A Adverse effect of glucocorticoids and synthetic analogues, initial encounter: Secondary | ICD-10-CM | POA: Diagnosis present

## 2023-04-28 DIAGNOSIS — G4733 Obstructive sleep apnea (adult) (pediatric): Secondary | ICD-10-CM | POA: Diagnosis not present

## 2023-04-28 DIAGNOSIS — E669 Obesity, unspecified: Secondary | ICD-10-CM | POA: Diagnosis present

## 2023-04-28 DIAGNOSIS — Z923 Personal history of irradiation: Secondary | ICD-10-CM | POA: Diagnosis not present

## 2023-04-28 DIAGNOSIS — E1122 Type 2 diabetes mellitus with diabetic chronic kidney disease: Secondary | ICD-10-CM | POA: Diagnosis present

## 2023-04-28 DIAGNOSIS — I5033 Acute on chronic diastolic (congestive) heart failure: Secondary | ICD-10-CM | POA: Diagnosis present

## 2023-04-28 DIAGNOSIS — I130 Hypertensive heart and chronic kidney disease with heart failure and stage 1 through stage 4 chronic kidney disease, or unspecified chronic kidney disease: Secondary | ICD-10-CM | POA: Diagnosis present

## 2023-04-28 DIAGNOSIS — J9601 Acute respiratory failure with hypoxia: Secondary | ICD-10-CM | POA: Diagnosis present

## 2023-04-28 DIAGNOSIS — Z982 Presence of cerebrospinal fluid drainage device: Secondary | ICD-10-CM | POA: Diagnosis not present

## 2023-04-28 DIAGNOSIS — G912 (Idiopathic) normal pressure hydrocephalus: Secondary | ICD-10-CM | POA: Diagnosis present

## 2023-04-28 DIAGNOSIS — D7589 Other specified diseases of blood and blood-forming organs: Secondary | ICD-10-CM | POA: Diagnosis present

## 2023-04-28 DIAGNOSIS — N1831 Chronic kidney disease, stage 3a: Secondary | ICD-10-CM | POA: Diagnosis present

## 2023-04-28 DIAGNOSIS — E870 Hyperosmolality and hypernatremia: Secondary | ICD-10-CM | POA: Diagnosis present

## 2023-04-28 DIAGNOSIS — E1165 Type 2 diabetes mellitus with hyperglycemia: Secondary | ICD-10-CM | POA: Diagnosis present

## 2023-04-28 LAB — BASIC METABOLIC PANEL
Anion gap: 10 (ref 5–15)
BUN: 22 mg/dL (ref 8–23)
CO2: 35 mmol/L — ABNORMAL HIGH (ref 22–32)
Calcium: 8.7 mg/dL — ABNORMAL LOW (ref 8.9–10.3)
Chloride: 103 mmol/L (ref 98–111)
Creatinine, Ser: 1.32 mg/dL — ABNORMAL HIGH (ref 0.61–1.24)
GFR, Estimated: 54 mL/min — ABNORMAL LOW (ref >60)
Glucose, Bld: 134 mg/dL — ABNORMAL HIGH (ref 70–99)
Potassium: 3.8 mmol/L (ref 3.5–5.1)
Sodium: 148 mmol/L — ABNORMAL HIGH (ref 135–145)

## 2023-04-28 LAB — GLUCOSE, CAPILLARY
Glucose-Capillary: 142 mg/dL — ABNORMAL HIGH (ref 70–99)
Glucose-Capillary: 243 mg/dL — ABNORMAL HIGH (ref 70–99)
Glucose-Capillary: 217 mg/dL — ABNORMAL HIGH (ref 70–99)

## 2023-04-28 LAB — ECHOCARDIOGRAM COMPLETE
AR max vel: 2.85 cm2
AV Area VTI: 2.86 cm2
AV Area mean vel: 2.91 cm2
AV Mean grad: 5 mm[Hg]
AV Peak grad: 9.9 mm[Hg]
Ao pk vel: 1.57 m/s
Area-P 1/2: 3.42 cm2
Height: 70 in
S' Lateral: 3 cm
Weight: 3054.69 [oz_av]

## 2023-04-28 LAB — CBG MONITORING, ED
Glucose-Capillary: 136 mg/dL — ABNORMAL HIGH (ref 70–99)
Glucose-Capillary: 129 mg/dL — ABNORMAL HIGH (ref 70–99)

## 2023-04-28 LAB — HEMOGLOBIN A1C
Hgb A1c MFr Bld: 7.7 % — ABNORMAL HIGH (ref 4.8–5.6)
Mean Plasma Glucose: 174.29 mg/dL

## 2023-04-28 MED ORDER — PANTOPRAZOLE SODIUM 40 MG PO TBEC
40.0000 mg | DELAYED_RELEASE_TABLET | Freq: Every day | ORAL | Status: DC
  Administered 2023-04-28 – 2023-05-03 (×6): 40 mg via ORAL
  Filled 2023-04-28 (×6): qty 1

## 2023-04-28 MED ORDER — BUDESONIDE 0.25 MG/2ML IN SUSP
0.2500 mg | Freq: Two times a day (BID) | RESPIRATORY_TRACT | Status: DC
  Administered 2023-04-28 – 2023-05-03 (×10): 0.25 mg via RESPIRATORY_TRACT
  Filled 2023-04-28 (×11): qty 2

## 2023-04-28 MED ORDER — METHYLPREDNISOLONE SODIUM SUCC 125 MG IJ SOLR
60.0000 mg | INTRAMUSCULAR | Status: DC
  Administered 2023-04-28: 60 mg via INTRAVENOUS
  Filled 2023-04-28 (×2): qty 2

## 2023-04-28 MED ORDER — IPRATROPIUM-ALBUTEROL 0.5-2.5 (3) MG/3ML IN SOLN
3.0000 mL | Freq: Four times a day (QID) | RESPIRATORY_TRACT | Status: DC
  Administered 2023-04-28 – 2023-04-30 (×8): 3 mL via RESPIRATORY_TRACT
  Filled 2023-04-28 (×8): qty 3

## 2023-04-28 MED ORDER — PERFLUTREN LIPID MICROSPHERE
3.0000 mL | INTRAVENOUS | Status: AC | PRN
  Administered 2023-04-28: 3 mL via INTRAVENOUS

## 2023-04-28 MED ORDER — ARFORMOTEROL TARTRATE 15 MCG/2ML IN NEBU
15.0000 ug | INHALATION_SOLUTION | Freq: Two times a day (BID) | RESPIRATORY_TRACT | Status: DC
  Administered 2023-04-28 – 2023-05-03 (×11): 15 ug via RESPIRATORY_TRACT
  Filled 2023-04-28 (×11): qty 2

## 2023-04-28 MED ORDER — AMOXICILLIN-POT CLAVULANATE 875-125 MG PO TABS
1.0000 | ORAL_TABLET | Freq: Two times a day (BID) | ORAL | Status: DC
  Administered 2023-04-28 – 2023-04-30 (×5): 1 via ORAL
  Filled 2023-04-28 (×5): qty 1

## 2023-04-28 MED ORDER — PNEUMOCOCCAL 20-VAL CONJ VACC 0.5 ML IM SUSY
0.5000 mL | PREFILLED_SYRINGE | INTRAMUSCULAR | Status: DC
  Filled 2023-04-28: qty 0.5

## 2023-04-28 NOTE — ED Notes (Signed)
ED TO INPATIENT HANDOFF REPORT  ED Nurse Name and Phone #: [phone]  S Name/Age/Gender Jeremiah [name] 82 y.o. male Room/Bed: 007C/007C  Code Status   Code Status: Full Code  Home/SNF/Other Home Patient oriented to: self, place, time, and situation Is this baseline? Yes   Triage Complete: Triage complete  Chief Complaint Acute hypoxemic respiratory failure (HCC) [J96.01]  Triage Note Pt arrived via [facility] from home for SOB and weakness3wks. Pt is supposed to wear 2L 02 per [place] at baseline, but is non-compliant. Per EMS, when fire got there pt was 80% on RA, pale, lips were blue. Per EMS, pt has decreased lung sound on left side. Pt c/o nasal congestion, runny nosex3wks.      Allergies Allergies  Allergen Reactions   Lipitor [Atorvastatin] Other (See Comments)    Hip pain    Level of Care/Admitting Diagnosis ED Disposition     ED Disposition  Admit   Condition  --   Comment  Hospital Area: [facility] [[id number]]  Level of Care: Progressive [102]  Admit to Progressive based on following criteria: RESPIRATORY PROBLEMS hypoxemic/hypercapnic respiratory failure that is responsive to NIPPV (BiPAP) or High Flow Nasal Cannula (6-80 lpm). Frequent assessment/intervention, no > Q2 hrs < Q4 hrs, to maintain oxygenation and pulmonary hygiene.  May place patient in observation at [facility] or [facility] if equivalent level of care is available:: Yes  Covid Evaluation: Asymptomatic - no recent exposure (last 10 days) testing not required  Diagnosis: Acute hypoxemic respiratory failure [facility]) [[id number]]  Admitting Physician: John Giovanni [[id number]]  Attending Physician: John Giovanni [[id number]]          B Medical/Surgery History Past Medical History:  Diagnosis Date   BPH (benign prostatic hyperplasia)    Central retinal artery occlusion    Coronary artery disease    Diabetes mellitus without complication (HCC)    diet controlled    Diverticulosis    History of kidney stones    Hyperlipidemia    Hypertension    OSA on CPAP    wears cpap   Osteoarthritis    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, 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   S/P radiation therapy 04/23/13 - 05/29/14   Prostate/seminal vesicles, external beam 4500 cGy in 25 sessions   Seasonal allergies    Skin cancer    scalp   Small bowel obstruction (HCC) 06/10/2016   Wears glasses    Past Surgical History:  Procedure Laterality Date   BACK SURGERY  2009   CARDIAC CATHETERIZATION     COLONOSCOPY W/ BIOPSIES AND POLYPECTOMY     CORONARY STENT INTERVENTION N/A 01/24/2022   Procedure: CORONARY STENT INTERVENTION;  Surgeon: Swaziland, [name], MD;  Location: [facility] INVASIVE CV LAB;  Service: Cardiovascular;  Laterality: N/A;   CYSTOSCOPY W/ URETERAL STENT PLACEMENT Left 05/12/2021   Procedure: CYSTOSCOPY WITH RETROGRADE PYELOGRAM/ LEFT URETERAL STENT PLACEMENT.;  Surgeon: Jannifer Hick, MD;  Location: [facility] OR;  Service: Urology;  Laterality: Left;   HYDROCELE EXCISION  1963   LEFT HEART CATH AND CORONARY ANGIOGRAPHY N/A 03/14/2019   Procedure: LEFT HEART CATH AND CORONARY ANGIOGRAPHY;  Surgeon: Kathleene Hazel, MD;  Location: [facility] INVASIVE CV LAB;  Service: Cardiovascular;  Laterality: N/A;   LEFT HEART CATH AND CORONARY ANGIOGRAPHY N/A 01/24/2022   Procedure: LEFT HEART CATH AND CORONARY ANGIOGRAPHY;  Surgeon: Swaziland, [name], MD;  Location: [facility] INVASIVE CV LAB;  Service: Cardiovascular;  Laterality: N/A;   PROSTATE BIOPSY  2013, 2014, 01/2014   Gleason 7   RADIOACTIVE SEED IMPLANT N/A 07/01/2014   Procedure: RADIOACTIVE SEED IMPLANT;  Surgeon: Valetta Fuller, MD;  Location: [facility];  Service: Urology;  Laterality: N/A;  DR PORTABLE   TONSILLECTOMY     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   Procedure: SHUNT INSERTION VENTRICULAR-PERITONEAL;  Surgeon: Tia Alert, MD;  Location: [facility] OR;  Service: Neurosurgery;  Laterality: N/A;  SHUNT INSERTION  VENTRICULAR-PERITONEAL     A IV Location/Drains/Wounds Patient Lines/Drains/Airways Status     Active Line/Drains/Airways     Name Placement date Placement time Site Days   Peripheral IV 04/27/23 20 G Left Antecubital 04/27/23  --  Antecubital  1            Intake/Output Last 24 hours No intake or output data in the 24 hours ending 04/28/23 0947  Labs/Imaging Results for orders placed or performed during the hospital encounter of 04/27/23 (from the past 48 hour(s))  Brain natriuretic peptide     Status: Abnormal   Collection Time: 04/27/23 11:55 AM  Result Value Ref Range   B Natriuretic Peptide 116.9 (H) 0.0 - 100.0 pg/mL    Comment: Performed at [facility], [address], [place], [place] [address]  Resp panel by RT-PCR (RSV, Flu A&B, Covid) Anterior Nasal Swab     Status: None   Collection Time: 04/27/23 11:56 AM   Specimen: Anterior Nasal Swab  Result Value Ref Range   SARS Coronavirus 2 by RT PCR NEGATIVE NEGATIVE   Influenza A by PCR NEGATIVE NEGATIVE   Influenza B by PCR NEGATIVE NEGATIVE    Comment: (NOTE) The Xpert Xpress SARS-CoV-2/FLU/RSV plus assay is intended as an aid in the diagnosis of influenza from Nasopharyngeal swab specimens and should not be used as a sole basis for treatment. Nasal washings and aspirates are unacceptable for Xpert Xpress SARS-CoV-2/FLU/RSV testing.  Fact Sheet for Patients: BloggerCourse.com  Fact Sheet for Healthcare Providers: SeriousBroker.it  This test is not yet approved or cleared by the [place] FDA and has been authorized for detection and/or diagnosis of SARS-CoV-2 by FDA under an Emergency Use Authorization (EUA). This EUA will remain in effect (meaning this test can be used) for the duration of the COVID-19 declaration under Section 564(b)(1) of the Act, 21 U.S.C. section 360bbb-3(b)(1), unless the authorization is terminated or revoked.     Resp  Syncytial Virus by PCR NEGATIVE NEGATIVE    Comment: (NOTE) Fact Sheet for Patients: BloggerCourse.com  Fact Sheet for Healthcare Providers: SeriousBroker.it  This test is not yet approved or cleared by the [place] FDA and has been authorized for detection and/or diagnosis of SARS-CoV-2 by FDA under an Emergency Use Authorization (EUA). This EUA will remain in effect (meaning this test can be used) for the duration of the COVID-19 declaration under Section 564(b)(1) of the Act, 21 U.S.C. section 360bbb-3(b)(1), unless the authorization is terminated or revoked.  Performed at [facility], [address], [place], [place] [address]   CBC     Status: Abnormal   Collection Time: 04/27/23 11:56 AM  Result Value Ref Range   WBC 7.5 4.0 - 10.5 K/uL   RBC 4.76 4.22 - 5.81 MIL/uL   Hemoglobin 15.4 13.0 - 17.0 g/dL   HCT 09.8 11.9 - 14.7 %   MCV 103.6 (H) 80.0 - 100.0 fL   MCH 32.4 26.0 - 34.0 pg   MCHC 31.2 30.0 - 36.0 g/dL   RDW  14.8 11.5 - 15.5 %   Platelets 159 150 - 400 K/uL   nRBC 0.0 0.0 - 0.2 %    Comment: Performed at [facility], [address] [place], [place] [address]  Troponin I (High Sensitivity)     Status: None   Collection Time: 04/27/23 12:10 PM  Result Value Ref Range   Troponin I (High Sensitivity) 7 <18 ng/L    Comment: (NOTE) Elevated high sensitivity troponin I (hsTnI) values and significant  changes across serial measurements may suggest ACS but many other  chronic and acute conditions are known to elevate hsTnI results.  Refer to the "Links" section for chest pain algorithms and additional  guidance. Performed at [facility], [address] [place], [place] [address]   Comprehensive metabolic panel     Status: Abnormal   Collection Time: 04/27/23  1:27 PM  Result Value Ref Range   Sodium 148 (H) 135 - 145 mmol/L   Potassium 4.5 3.5 - 5.1 mmol/L   Chloride 103 98 - 111  mmol/L   CO2 37 (H) 22 - 32 mmol/L   Glucose, Bld 179 (H) 70 - 99 mg/dL    Comment: Glucose reference range applies only to samples taken after fasting for at least 8 hours.   BUN 22 8 - 23 mg/dL   Creatinine, Ser 0.98 0.61 - 1.24 mg/dL   Calcium 8.8 (L) 8.9 - 10.3 mg/dL   Total Protein 5.8 (L) 6.5 - 8.1 g/dL   Albumin 3.3 (L) 3.5 - 5.0 g/dL   AST 14 (L) 15 - 41 U/L   ALT 9 0 - 44 U/L   Alkaline Phosphatase 54 38 - 126 U/L   Total Bilirubin 1.2 (H) <1.2 mg/dL   GFR, Estimated 59 (L) >60 mL/min    Comment: (NOTE) Calculated using the CKD-EPI Creatinine Equation (2021)    Anion gap 8 5 - 15    Comment: Performed at [facility], [address], [place], [place] [address]  Troponin I (High Sensitivity)     Status: None   Collection Time: 04/27/23  1:27 PM  Result Value Ref Range   Troponin I (High Sensitivity) 9 <18 ng/L    Comment: (NOTE) Elevated high sensitivity troponin I (hsTnI) values and significant  changes across serial measurements may suggest ACS but many other  chronic and acute conditions are known to elevate hsTnI results.  Refer to the "Links" section for chest pain algorithms and additional  guidance. Performed at [facility], [address], [place], [place] [address]   CBG monitoring, ED     Status: Abnormal   Collection Time: 04/27/23 11:15 PM  Result Value Ref Range   Glucose-Capillary 123 (H) 70 - 99 mg/dL    Comment: Glucose reference range applies only to samples taken after fasting for at least 8 hours.  Basic metabolic panel     Status: Abnormal   Collection Time: 04/28/23  4:46 AM  Result Value Ref Range   Sodium 148 (H) 135 - 145 mmol/L   Potassium 3.8 3.5 - 5.1 mmol/L   Chloride 103 98 - 111 mmol/L   CO2 35 (H) 22 - 32 mmol/L   Glucose, Bld 134 (H) 70 - 99 mg/dL    Comment: Glucose reference range applies only to samples taken after fasting for at least 8 hours.   BUN 22 8 - 23 mg/dL   Creatinine, Ser 6.21 (H) 0.61 - 1.24 mg/dL  Calcium 8.7 (L) 8.9 - 10.3 mg/dL   GFR, Estimated 54 (L) >60 mL/min    Comment: (NOTE) Calculated using the CKD-EPI Creatinine Equation (2021)    Anion gap 10 5 - 15    Comment: Performed at [facility], [address], [place], [place] [address]  Hemoglobin A1c     Status: Abnormal   Collection Time: 04/28/23  4:46 AM  Result Value Ref Range   Hgb A1c MFr Bld 7.7 (H) 4.8 - 5.6 %    Comment: (NOTE) Pre diabetes:          5.7%-6.4%  Diabetes:              >6.4%  Glycemic control for   <7.0% adults with diabetes    Mean Plasma Glucose 174.29 mg/dL    Comment: Performed at [facility], [address], [place], [place] [address]  CBG monitoring, ED     Status: Abnormal   Collection Time: 04/28/23  6:27 AM  Result Value Ref Range   Glucose-Capillary 136 (H) 70 - 99 mg/dL    Comment: Glucose reference range applies only to samples taken after fasting for at least 8 hours.  CBG monitoring, ED     Status: Abnormal   Collection Time: 04/28/23  8:05 AM  Result Value Ref Range   Glucose-Capillary 129 (H) 70 - 99 mg/dL    Comment: Glucose reference range applies only to samples taken after fasting for at least 8 hours.   Comment 1 Notify RN    Comment 2 Document in Chart    CT Angio Chest PE W and/or Wo Contrast  Result Date: 04/27/2023 CLINICAL DATA:  Shortness of breath and weakness EXAM: CT ANGIOGRAPHY CHEST WITH CONTRAST TECHNIQUE: Multidetector CT imaging of the chest was performed using the standard protocol during bolus administration of intravenous contrast. Multiplanar CT image reconstructions and MIPs were obtained to evaluate the vascular anatomy. RADIATION DOSE REDUCTION: This exam was performed according to the departmental dose-optimization program which includes automated exposure control, adjustment of the mA and/or kV according to patient size and/or use of iterative reconstruction technique. CONTRAST:  75mL OMNIPAQUE IOHEXOL 350 MG/ML SOLN COMPARISON:  Plain  film of earlier today.  CTA chest 01/27/2022. FINDINGS: Cardiovascular: The quality of this exam for evaluation of pulmonary embolism is moderate. The bolus is centered in the [facility] to the and there is minimal motion degradation. No evidence of pulmonary embolism. Aortic atherosclerosis. Mild ascending aortic dilatation including at 4.2 cm, similar. Tortuous descending thoracic aorta. Mild cardiomegaly, without pericardial effusion. Lad and right coronary artery calcification. Pulmonary artery enlargement, outflow tract 3.7 cm. Mediastinum/Nodes: No mediastinal or hilar adenopathy. Lungs/Pleura: Small right pleural effusion, minimally increased. Moderate right hemidiaphragm elevation. VP shunt catheter again identified about the right chest wall, terminating in the right pleural space inferiorly. Right greater than left base volume loss and airspace disease, all favored to represent atelectasis and scar. Upper Abdomen: small dependent gallstones. Normal imaged portions of the liver, spleen, stomach, pancreas, adrenal glands. Musculoskeletal: No acute osseous abnormality. Review of the MIP images confirms the above findings. IMPRESSION: 1. No pulmonary embolism with above limitations. 2. Moderate to marked right hemidiaphragm elevation with persistent small right pleural effusion and similar bibasilar volume loss with atelectasis and scar. 3. Similar mild ascending aortic dilatation at 4.2 cm. Recommend annual imaging followup by CTA or MRA. This recommendation follows 2010 ACCF/AHA/AATS/ACR/ASA/SCA/SCAI/SIR/STS/SVM Guidelines for the Diagnosis and Management of Patients with Thoracic Aortic Disease. Circulation. 2010;  121: [id number]. Aortic aneurysm NOS (ICD10-I71.9) 4. Pulmonary artery enlargement suggests pulmonary arterial hypertension. 5. Cholelithiasis 6. Aortic Atherosclerosis (ICD10-I70.0). Coronary artery atherosclerosis. Electronically Signed   By: Jeronimo Greaves M.D.   On: 04/27/2023 17:34   DG Chest Port 1  View  Result Date: 04/27/2023 CLINICAL DATA:  Shortness of breath.  Fatigue. EXAM: PORTABLE CHEST 1 VIEW COMPARISON:  01/27/2022. FINDINGS: Low lung volume. There are probable atelectatic changes/scarring at the lung bases. No acute consolidation or lung collapse. Apparent blunting of bilateral lateral costophrenic angles may be due to trace pleural effusion versus superimposed soft tissue. No pneumothorax. Stable cardio-mediastinal silhouette. No acute osseous abnormalities. The soft tissues are within normal limits. Presumed VP shunt tubing noted along the right lateral chest. IMPRESSION: *Bibasilar atelectasis/scarring. No acute consolidation or lung collapse. Electronically Signed   By: Jules Schick M.D.   On: 04/27/2023 14:24    Pending Labs Unresulted Labs (From admission, onward)    None       Vitals/Pain Today's Vitals   04/28/23 0515 04/28/23 0700 04/28/23 0734 04/28/23 0807  BP:  123/62  131/71  Pulse: 84 75  79  Resp: 19   (!) 30  Temp:   98.5 F (36.9 C) 98.5 F (36.9 C)  TempSrc:   Oral   SpO2: (!) 89% (!) 89%  92%  Weight:      Height:      PainSc:        Isolation Precautions No active isolations  Medications Medications  enoxaparin (LOVENOX) injection 40 mg (40 mg Subcutaneous Given 04/28/23 0947)  acetaminophen (TYLENOL) tablet 650 mg (has no administration in time range)    Or  acetaminophen (TYLENOL) suppository 650 mg (has no administration in time range)  insulin aspart (novoLOG) injection 0-9 Units (1 Units Subcutaneous Given 04/28/23 0734)  insulin aspart (novoLOG) injection 0-5 Units ( Subcutaneous Not Given 04/27/23 2317)  ipratropium-albuterol (DUONEB) 0.5-2.5 (3) MG/3ML nebulizer solution 3 mL (3 mLs Nebulization Given 04/27/23 1214)  iohexol (OMNIPAQUE) 350 MG/ML injection 75 mL (75 mLs Intravenous Contrast Given 04/27/23 1626)    Mobility walks with person assist     Focused Assessments    R Recommendations: See Admitting Provider  Note  Report given to:   Additional Notes:

## 2023-04-28 NOTE — ED Notes (Signed)
Pt pulled IV out RN notified

## 2023-04-28 NOTE — Progress Notes (Signed)
   04/28/23 1945  BiPAP/CPAP/SIPAP  BiPAP/CPAP/SIPAP Pt Type Adult  Reason BIPAP/CPAP not in use Non-compliant

## 2023-04-28 NOTE — Plan of Care (Signed)
  Problem: Fluid Volume: Goal: Ability to maintain a balanced intake and output will improve Outcome: Progressing   Problem: Metabolic: Goal: Ability to maintain appropriate glucose levels will improve Outcome: Progressing   Problem: Nutritional: Goal: Maintenance of adequate nutrition will improve Outcome: Progressing   Problem: Clinical Measurements: Goal: Ability to maintain clinical measurements within normal limits will improve Outcome: Progressing

## 2023-04-28 NOTE — Progress Notes (Signed)
[facility]                                                                              Dash Thorell, is a 82 y.o. male, DOB - July 12, 1940, ZOX:[id number] Admit date - 04/27/2023    Outpatient Primary MD for the patient is Rodrigo Ran, MD  LOS - 0  days  Chief Complaint  Patient presents with   Shortness of Breath   Weakness       Brief summary   Patient is a 82 year old male with CAD status post PCI, chronic respiratory failure on O2 2 L,  NPH s/p VP shunt, diabetes mellitus type 2, HLP, HTN, BPH, CKD stage IIIa, OSA on CPAP, prostate CA, skin CA presented to ED with shortness of breath and hypoxia.  Patient has not been using his home O2.  Caregiver went over to his house and noted that his O2 sats were in the 70s on room air and cyanotic lips.  When EMS arrived, he was 80% on room air and was placed on 3 L O2 via [place], improved sats to mid 90s.  In ED, patient was noted to be still hypoxic and was placed on 6 L O2 via [place]. Per patient, he has been feeling short of breath for last 1 year, has been on home O2 2 L but does not use it regularly.  Patient is also noncompliant with CPAP.  Lives at home with his wife who has dementia and they have a caregiver who visits 3 times a week.  No chest pain, cough or fevers.  NAD, tachypneic, afebrile, no leukocytosis, troponins negative, creatinine 1.2 BNP 116.  CTA chest negative for PE, moderate to marked right hemidiaphragm elevation with persistent small right pleural effusion, similar bibasilar volume loss, pulmonary arterial hypertension.   Assessment & Plan    Principal Problem:   Acute on chronic respiratory failure with hypoxia (HCC) possibly due to acute bronchitis, pulmonary hypertension, atelectasis, pleural effusion -Patient is on 2 L home O2 but noncompliant, has OSA with CPAP but noncompliant with CPAP as well. -Unclear etiology for acute hypoxia, no acute respiratory distress, COVID, influenza, RSV PCR  negative -BNP 116.   -CTA chest negative for PE, showed small right pleural effusion, atelectasis, pulmonary arterial hypertension.  Right greater than left base volume loss and airspace disease. -Will follow 2D echo, -Continue CPAP -Will place on scheduled DuoNebs, Pulmicort, Brovana, IV Solu-Medrol 60 mg daily, flutter valve, wean O2 as tolerated   Borderline hypernatremia -Continue to monitor   Mild ascending aortic dilation CT showing similar mild ascending aortic dilation at 4.2 cm.  -Recommended annual imaging follow-up with CTA or MRA.   Diabetes mellitus type 2, uncontrolled with hyperglycemia -Hemoglobin A1c 7.7 CBG (last 3)  Recent Labs    04/27/23 2315 04/28/23 0627 04/28/23 0805  GLUCAP 123* 136* 129*    -Continue sliding scale insulin while inpatient   CKD stage IIIa Creatinine 1.2, stable.   OSA Continue nightly CPAP.   CAD status post PCI in September 2023, HTN, hyperlipidemia -Currently no acute chest pain -Will resume meds once med rec completed  Obesity Estimated body mass  index is 27.39 kg/m as calculated from the following:   Height as of this encounter: 5\' 10"  (1.778 m).   Weight as of this encounter: 86.6 kg.  Code Status: Full code DVT Prophylaxis:  enoxaparin (LOVENOX) injection 40 mg Start: 04/28/23 1000   Level of Care: Level of care: Progressive Family Communication: Updated patient Disposition Plan:      Remains inpatient appropriate: Still on 6 L O2 via [place]   Procedures:    Consultants:     Antimicrobials:   Anti-infectives (From admission, onward)    None          Medications  enoxaparin (LOVENOX) injection  40 mg Subcutaneous Q24H   insulin aspart  0-5 Units Subcutaneous QHS   insulin aspart  0-9 Units Subcutaneous TID WC      Subjective:   Watkins Lachat was seen and examined today.  No acute complaints, on 6 L O2 via [facility], O2 sats 92%.  No acute dizziness, chest pain, nausea or vomiting.  No  fever.  Objective:   Vitals:   04/28/23 0515 04/28/23 0700 04/28/23 0734 04/28/23 0807  BP:  123/62  131/71  Pulse: 84 75  79  Resp: 19   (!) 30  Temp:   98.5 F (36.9 C) 98.5 F (36.9 C)  TempSrc:   Oral   SpO2: (!) 89% (!) 89%  92%  Weight:      Height:       No intake or output data in the 24 hours ending 04/28/23 1058   Wt Readings from Last 3 Encounters:  04/27/23 86.6 kg  01/17/23 86.6 kg  07/19/22 85.3 kg     Exam General: Alert and oriented x 3, NAD Cardiovascular: S1 S2 auscultated,  RRR Respiratory: Diminished breath sound at the bases, tachypnea Gastrointestinal: Soft, nontender, nondistended, + bowel sounds Ext: no pedal edema bilaterally Neuro: no new deficits Psych: Normal affect     Data Reviewed:  I have personally reviewed following labs    CBC Lab Results  Component Value Date   WBC 7.5 04/27/2023   RBC 4.76 04/27/2023   HGB 15.4 04/27/2023   HCT 49.3 04/27/2023   MCV 103.6 (H) 04/27/2023   MCH 32.4 04/27/2023   PLT 159 04/27/2023   MCHC 31.2 04/27/2023   RDW 14.8 04/27/2023   LYMPHSABS 1.0 01/28/2022   MONOABS 0.7 01/28/2022   EOSABS 0.2 01/28/2022   BASOSABS 0.0 01/28/2022     Last metabolic panel Lab Results  Component Value Date   NA 148 (H) 04/28/2023   K 3.8 04/28/2023   CL 103 04/28/2023   CO2 35 (H) 04/28/2023   BUN 22 04/28/2023   CREATININE 1.32 (H) 04/28/2023   GLUCOSE 134 (H) 04/28/2023   GFRNONAA 54 (L) 04/28/2023   GFRAA >60 07/13/2019   CALCIUM 8.7 (L) 04/28/2023   PHOS 4.2 01/28/2022   PROT 5.8 (L) 04/27/2023   ALBUMIN 3.3 (L) 04/27/2023   BILITOT 1.2 (H) 04/27/2023   ALKPHOS 54 04/27/2023   AST 14 (L) 04/27/2023   ALT 9 04/27/2023   ANIONGAP 10 04/28/2023    CBG (last 3)  Recent Labs    04/27/23 2315 04/28/23 0627 04/28/23 0805  GLUCAP 123* 136* 129*      Coagulation Profile: No results for input(s): "INR", "PROTIME" in the last 168 hours.   Radiology Studies: I have personally reviewed  the imaging studies  CT Angio Chest PE W and/or Wo Contrast  Result Date: 04/27/2023 CLINICAL DATA:  Shortness of  breath and weakness EXAM: CT ANGIOGRAPHY CHEST WITH CONTRAST TECHNIQUE: Multidetector CT imaging of the chest was performed using the standard protocol during bolus administration of intravenous contrast. Multiplanar CT image reconstructions and MIPs were obtained to evaluate the vascular anatomy. RADIATION DOSE REDUCTION: This exam was performed according to the departmental dose-optimization program which includes automated exposure control, adjustment of the mA and/or kV according to patient size and/or use of iterative reconstruction technique. CONTRAST:  75mL OMNIPAQUE IOHEXOL 350 MG/ML SOLN COMPARISON:  Plain film of earlier today.  CTA chest 01/27/2022. FINDINGS: Cardiovascular: The quality of this exam for evaluation of pulmonary embolism is moderate. The bolus is centered in the [facility] to the and there is minimal motion degradation. No evidence of pulmonary embolism. Aortic atherosclerosis. Mild ascending aortic dilatation including at 4.2 cm, similar. Tortuous descending thoracic aorta. Mild cardiomegaly, without pericardial effusion. Lad and right coronary artery calcification. Pulmonary artery enlargement, outflow tract 3.7 cm. Mediastinum/Nodes: No mediastinal or hilar adenopathy. Lungs/Pleura: Small right pleural effusion, minimally increased. Moderate right hemidiaphragm elevation. VP shunt catheter again identified about the right chest wall, terminating in the right pleural space inferiorly. Right greater than left base volume loss and airspace disease, all favored to represent atelectasis and scar. Upper Abdomen: small dependent gallstones. Normal imaged portions of the liver, spleen, stomach, pancreas, adrenal glands. Musculoskeletal: No acute osseous abnormality. Review of the MIP images confirms the above findings. IMPRESSION: 1. No pulmonary embolism with above limitations. 2.  Moderate to marked right hemidiaphragm elevation with persistent small right pleural effusion and similar bibasilar volume loss with atelectasis and scar. 3. Similar mild ascending aortic dilatation at 4.2 cm. Recommend annual imaging followup by CTA or MRA. This recommendation follows 2010 ACCF/AHA/AATS/ACR/ASA/SCA/SCAI/SIR/STS/SVM Guidelines for the Diagnosis and Management of Patients with Thoracic Aortic Disease. Circulation. 2010; 121: Z610-R604. Aortic aneurysm NOS (ICD10-I71.9) 4. Pulmonary artery enlargement suggests pulmonary arterial hypertension. 5. Cholelithiasis 6. Aortic Atherosclerosis (ICD10-I70.0). Coronary artery atherosclerosis. Electronically Signed   By: Jeronimo Greaves M.D.   On: 04/27/2023 17:34   DG Chest Port 1 View  Result Date: 04/27/2023 CLINICAL DATA:  Shortness of breath.  Fatigue. EXAM: PORTABLE CHEST 1 VIEW COMPARISON:  01/27/2022. FINDINGS: Low lung volume. There are probable atelectatic changes/scarring at the lung bases. No acute consolidation or lung collapse. Apparent blunting of bilateral lateral costophrenic angles may be due to trace pleural effusion versus superimposed soft tissue. No pneumothorax. Stable cardio-mediastinal silhouette. No acute osseous abnormalities. The soft tissues are within normal limits. Presumed VP shunt tubing noted along the right lateral chest. IMPRESSION: *Bibasilar atelectasis/scarring. No acute consolidation or lung collapse. Electronically Signed   By: Jules Schick M.D.   On: 04/27/2023 14:24       Lovene Maret M.D. [facility] 04/28/2023, 10:58 AM  Available via Epic secure chat 7am-7pm After 7 pm, please refer to night coverage provider listed on amion.

## 2023-04-29 ENCOUNTER — Inpatient Hospital Stay (HOSPITAL_COMMUNITY): Payer: Medicare Other

## 2023-04-29 DIAGNOSIS — J9621 Acute and chronic respiratory failure with hypoxia: Secondary | ICD-10-CM | POA: Diagnosis not present

## 2023-04-29 LAB — BASIC METABOLIC PANEL
Anion gap: 11 (ref 5–15)
BUN: 39 mg/dL — ABNORMAL HIGH (ref 8–23)
CO2: 31 mmol/L (ref 22–32)
Calcium: 8.8 mg/dL — ABNORMAL LOW (ref 8.9–10.3)
Chloride: 105 mmol/L (ref 98–111)
Creatinine, Ser: 1.53 mg/dL — ABNORMAL HIGH (ref 0.61–1.24)
GFR, Estimated: 45 mL/min — ABNORMAL LOW (ref >60)
Glucose, Bld: 203 mg/dL — ABNORMAL HIGH (ref 70–99)
Potassium: 4.3 mmol/L (ref 3.5–5.1)
Sodium: 147 mmol/L — ABNORMAL HIGH (ref 135–145)

## 2023-04-29 LAB — CBC WITH DIFFERENTIAL/PLATELET
Abs Immature Granulocytes: 0.08 10*3/uL — ABNORMAL HIGH (ref 0.00–0.07)
Basophils Absolute: 0 10*3/uL (ref 0.0–0.1)
Basophils Relative: 0 %
Eosinophils Absolute: 0 10*3/uL (ref 0.0–0.5)
Eosinophils Relative: 0 %
HCT: 46.5 % (ref 39.0–52.0)
Hemoglobin: 14.3 g/dL (ref 13.0–17.0)
Immature Granulocytes: 1 %
Lymphocytes Relative: 3 %
Lymphs Abs: 0.3 10*3/uL — ABNORMAL LOW (ref 0.7–4.0)
MCH: 31.8 pg (ref 26.0–34.0)
MCHC: 30.8 g/dL (ref 30.0–36.0)
MCV: 103.3 fL — ABNORMAL HIGH (ref 80.0–100.0)
Monocytes Absolute: 0.4 10*3/uL (ref 0.1–1.0)
Monocytes Relative: 4 %
Neutro Abs: 9.8 10*3/uL — ABNORMAL HIGH (ref 1.7–7.7)
Neutrophils Relative %: 92 %
Platelets: 134 10*3/uL — ABNORMAL LOW (ref 150–400)
RBC: 4.5 MIL/uL (ref 4.22–5.81)
RDW: 14.6 % (ref 11.5–15.5)
WBC: 10.6 10*3/uL — ABNORMAL HIGH (ref 4.0–10.5)
nRBC: 0 % (ref 0.0–0.2)

## 2023-04-29 LAB — PROCALCITONIN: Procalcitonin: <0.1 ng/mL

## 2023-04-29 LAB — C-REACTIVE PROTEIN: CRP: 7.6 mg/dL — ABNORMAL HIGH (ref <1.0)

## 2023-04-29 LAB — BRAIN NATRIURETIC PEPTIDE: B Natriuretic Peptide: 114.8 pg/mL — ABNORMAL HIGH (ref 0.0–100.0)

## 2023-04-29 LAB — GLUCOSE, CAPILLARY
Glucose-Capillary: 191 mg/dL — ABNORMAL HIGH (ref 70–99)
Glucose-Capillary: 202 mg/dL — ABNORMAL HIGH (ref 70–99)
Glucose-Capillary: 203 mg/dL — ABNORMAL HIGH (ref 70–99)
Glucose-Capillary: 266 mg/dL — ABNORMAL HIGH (ref 70–99)

## 2023-04-29 LAB — MAGNESIUM: Magnesium: 2 mg/dL (ref 1.7–2.4)

## 2023-04-29 MED ORDER — DEXTROSE 5 % IV SOLN: INTRAVENOUS | Status: AC

## 2023-04-29 MED ORDER — METHYLPREDNISOLONE SODIUM SUCC 40 MG IJ SOLR
30.0000 mg | INTRAMUSCULAR | Status: DC
  Administered 2023-04-29 – 2023-05-02 (×4): 30 mg via INTRAVENOUS
  Filled 2023-04-29 (×4): qty 1

## 2023-04-29 MED ORDER — FUROSEMIDE 10 MG/ML IJ SOLN
40.0000 mg | Freq: Once | INTRAMUSCULAR | Status: AC
  Administered 2023-04-29: 40 mg via INTRAVENOUS
  Filled 2023-04-29: qty 4

## 2023-04-29 MED ORDER — VITAMIN D 25 MCG (1000 UNIT) PO TABS
2000.0000 [IU] | ORAL_TABLET | Freq: Every day | ORAL | Status: DC
  Administered 2023-04-29 – 2023-05-03 (×5): 2000 [IU] via ORAL
  Filled 2023-04-29 (×5): qty 2

## 2023-04-29 MED ORDER — VITAMIN B-12 1000 MCG PO TABS
1000.0000 ug | ORAL_TABLET | ORAL | Status: DC
  Administered 2023-04-30 – 2023-05-02 (×2): 1000 ug via ORAL
  Filled 2023-04-29 (×2): qty 1

## 2023-04-29 MED ORDER — CARVEDILOL 12.5 MG PO TABS
12.5000 mg | ORAL_TABLET | Freq: Two times a day (BID) | ORAL | Status: DC
  Administered 2023-04-29 (×2): 12.5 mg via ORAL
  Filled 2023-04-29 (×2): qty 1

## 2023-04-29 MED ORDER — ROSUVASTATIN CALCIUM 5 MG PO TABS
10.0000 mg | ORAL_TABLET | Freq: Every day | ORAL | Status: DC
  Administered 2023-04-29 – 2023-05-03 (×5): 10 mg via ORAL
  Filled 2023-04-29 (×5): qty 2

## 2023-04-29 MED ORDER — CLOPIDOGREL BISULFATE 75 MG PO TABS
75.0000 mg | ORAL_TABLET | Freq: Every day | ORAL | Status: DC
Start: 2023-04-29 — End: 2023-05-03
  Administered 2023-04-29 – 2023-05-03 (×5): 75 mg via ORAL
  Filled 2023-04-29 (×5): qty 1

## 2023-04-29 MED ORDER — VITAMIN B-12 1000 MCG PO TABS
500.0000 ug | ORAL_TABLET | ORAL | Status: DC
  Administered 2023-04-29: 500 ug via ORAL
  Filled 2023-04-29: qty 1

## 2023-04-29 MED ORDER — EZETIMIBE 10 MG PO TABS
10.0000 mg | ORAL_TABLET | Freq: Every day | ORAL | Status: DC
Start: 2023-04-29 — End: 2023-05-03
  Administered 2023-04-29 – 2023-05-03 (×5): 10 mg via ORAL
  Filled 2023-04-29 (×5): qty 1

## 2023-04-29 MED ORDER — INSULIN GLARGINE-YFGN 100 UNIT/ML ~~LOC~~ SOLN
12.0000 [IU] | Freq: Every day | SUBCUTANEOUS | Status: DC
  Administered 2023-04-29 – 2023-05-03 (×5): 12 [IU] via SUBCUTANEOUS
  Filled 2023-04-29 (×5): qty 0.12

## 2023-04-29 MED ORDER — ASPIRIN 81 MG PO TBEC
81.0000 mg | DELAYED_RELEASE_TABLET | Freq: Every day | ORAL | Status: DC
  Administered 2023-04-29 – 2023-05-03 (×5): 81 mg via ORAL
  Filled 2023-04-29 (×5): qty 1

## 2023-04-29 MED ORDER — VITAMIN B-12 500 MCG PO TABS: 500.0000 ug | ORAL_TABLET | Freq: Every day | ORAL | Status: DC

## 2023-04-29 NOTE — Plan of Care (Signed)
  Problem: Education: Goal: Ability to describe self-care measures that may prevent or decrease complications (Diabetes Survival Skills Education) will improve Outcome: Progressing   Problem: Coping: Goal: Ability to adjust to condition or change in health will improve Outcome: Progressing   Problem: Fluid Volume: Goal: Ability to maintain a balanced intake and output will improve Outcome: Progressing   Problem: Health Behavior/Discharge Planning: Goal: Ability to identify and utilize available resources and services will improve Outcome: Progressing   Problem: Metabolic: Goal: Ability to maintain appropriate glucose levels will improve Outcome: Progressing   Problem: Nutritional: Goal: Maintenance of adequate nutrition will improve Outcome: Progressing Goal: Progress toward achieving an optimal weight will improve Outcome: Progressing   Problem: Skin Integrity: Goal: Risk for impaired skin integrity will decrease Outcome: Progressing   Problem: Tissue Perfusion: Goal: Adequacy of tissue perfusion will improve Outcome: Progressing   Problem: Education: Goal: Knowledge of General Education information will improve Description: Including pain rating scale, medication(s)/side effects and non-pharmacologic comfort measures Outcome: Progressing   Problem: Clinical Measurements: Goal: Will remain free from infection Outcome: Progressing Goal: Diagnostic test results will improve Outcome: Progressing Goal: Respiratory complications will improve Outcome: Progressing

## 2023-04-29 NOTE — Progress Notes (Signed)
   04/29/23 2014  BiPAP/CPAP/SIPAP  Reason BIPAP/CPAP not in use Non-compliant (pt refused for the night)  BiPAP/CPAP /SiPAP Vitals  SpO2 94 %  Bilateral Breath Sounds Coarse crackles;Diminished

## 2023-04-29 NOTE — Progress Notes (Signed)
PROGRESS NOTE                                                                                                                                                                                                             Patient Demographics:    Jeremiah [name], is a 82 y.o. male, DOB - 1941/04/03, VOZ:[id number]  Outpatient Primary MD for the patient is Rodrigo Ran, MD    LOS - 1  Admit date - 04/27/2023    Chief Complaint  Patient presents with   Shortness of Breath   Weakness       Brief Narrative (HPI from H&P)   82 year old male with CAD status post PCI, chronic respiratory failure on O2 2 L,  NPH s/p VP shunt, diabetes mellitus type 2, HLP, HTN, BPH, CKD stage IIIa, OSA on CPAP, prostate CA, skin CA presented to ED with shortness of breath and hypoxia.  Patient has not been using his home O2.  Caregiver went over to his house and noted that his O2 sats were in the 70s on room air and cyanotic lips.  When EMS arrived, he was 80% on room air and was placed on 3 L O2 via [place], improved sats to mid 90s.  In ED, patient was noted to be still hypoxic and was placed on 6 L O2 via [place]. Per patient, he has been feeling short of breath for last 1 year, has been on home O2 2 L but does not use it regularly.  Patient is also noncompliant with CPAP.  Lives at home with his wife who has dementia and they have a caregiver who visits 3 times a week.  No chest pain, cough or fevers.   NAD, tachypneic, afebrile, no leukocytosis, troponins negative, creatinine 1.2, BNP 116.  CTA chest negative for PE, moderate to marked right hemidiaphragm elevation with persistent small right pleural effusion, similar bibasilar volume loss, pulmonary arterial hypertension.   Subjective:    [name] today has, No headache, No chest pain, No abdominal pain - No Nausea, No new weakness tingling or numbness, no cough +ve SOB   Assessment  & Plan :    Acute on  chronic respiratory failure with hypoxia (HCC) possibly due to acute bronchitis, pulmonary hypertension, atelectasis, pleural effusion.  Likely acute on chronic diastolic CHF.  EF 60%.  Patient is  on 2 L home O2 but noncompliant, has OSA with CPAP but noncompliant with CPAP as well.  Short of breath over the last 7 to 10 days, mild orthopnea, BNP slightly elevated, chest x-ray suggestive of mild fluid overload, trial of Lasix, also check extended respiratory viral panel.  CTA negative for PE.  I-S and flutter valve for pulmonary toiletry, rest to sit in chair use I-S and flutter valve.  Placed on IV steroids gradually taper down.     Mild ascending aortic dilation - CT showing similar mild ascending aortic dilation at 4.2 cm.  Recommended annual imaging follow-up with CTA or MRA.  Beta-blocker for now.  Mild hypernatremia.  Gentle D5.    CKD stage IIIa  Creatinine 1.2, stable.   OSA Continue nightly CPAP.   CAD status post PCI in September 2023, HTN, hyperlipidemia -Currently no acute chest pain, on Coreg, aspirin, Zetia, DAPT and statin for secondary prevention continue.    Obesity Estimated body mass index is 27.39 kg/m, follow-up with PCP.  Diabetes mellitus type 2, uncontrolled with hyperglycemia  - Hemoglobin A1c 7.7, ISS add Semglee as sugars are high due to steroid use  Lab Results  Component Value Date   HGBA1C 7.7 (H) 04/28/2023   CBG (last 3)  Recent Labs    04/28/23 1638 04/28/23 2136 04/29/23 0752  GLUCAP 243* 217* 191*         Condition -  Guarded  Family Communication  :  None  Code Status :  FULL  Consults  :  None  PUD Prophylaxis : PPI   Procedures  :     CT - 1. No pulmonary embolism with above limitations. 2. Moderate to marked right hemidiaphragm elevation with persistent small right pleural effusion and similar bibasilar volume loss with atelectasis and scar. 3. Similar mild ascending aortic dilatation at 4.2 cm. Recommend annual imaging followup  by CTA or MRA. 4. Pulmonary artery enlargement suggests pulmonary arterial hypertension. 5. Cholelithiasis 6. Aortic Atherosclerosis (ICD10-I70.0). Coronary artery atherosclerosis.  TTE - 1. Left ventricular ejection fraction, by estimation, is 60 to 65%. The left ventricle has normal function. The left ventricle has no regional wall motion abnormalities. There is moderate left ventricular hypertrophy. Left ventricular diastolic parameters are consistent with Grade I diastolic dysfunction (impaired relaxation).  2. Right ventricular systolic function is mildly reduced. The right ventricular size is normal.  3. The mitral valve is normal in structure. No evidence of mitral valve regurgitation. No evidence of mitral stenosis.  4. The aortic valve is tricuspid. There is mild calcification of the aortic valve. There is mild thickening of the aortic valve. Aortic valve regurgitation is trivial. Aortic valve sclerosis is present, with no evidence of aortic valve stenosis.  5. Aortic dilatation noted. There is mild dilatation of the ascending aorta, measuring 41 mm.  6. The inferior vena cava is normal in size with greater than 50% respiratory variability, suggesting right atrial pressure of 3 mmHg.      Disposition Plan  :    Status is: Inpatient  DVT Prophylaxis  :    enoxaparin (LOVENOX) injection 40 mg Start: 04/28/23 1000   Lab Results  Component Value Date   PLT 134 (L) 04/29/2023    Diet :  Diet Order             Diet Carb Modified Fluid consistency: Thin; Room service appropriate? Yes  Diet effective now  Inpatient Medications  Scheduled Meds:  amoxicillin-clavulanate  1 tablet Oral Q12H   arformoterol  15 mcg Nebulization BID   aspirin EC  81 mg Oral Daily   budesonide (PULMICORT) nebulizer solution  0.25 mg Nebulization BID   carvedilol  12.5 mg Oral BID   cholecalciferol  2,000 Units Oral Daily   [START ON 04/30/2023] clopidogrel  75 mg Oral Q breakfast    enoxaparin (LOVENOX) injection  40 mg Subcutaneous Q24H   ezetimibe  10 mg Oral Daily   furosemide  40 mg Intravenous Once   insulin aspart  0-5 Units Subcutaneous QHS   insulin aspart  0-9 Units Subcutaneous TID WC   ipratropium-albuterol  3 mL Nebulization Q6H   methylPREDNISolone (SOLU-MEDROL) injection  60 mg Intravenous Q24H   pantoprazole  40 mg Oral Q0600   pneumococcal 20-valent conjugate vaccine  0.5 mL Intramuscular Tomorrow-1000   rosuvastatin  10 mg Oral Daily   vitamin B-12  500-1,000 mcg Oral Daily   Continuous Infusions:  dextrose     PRN Meds:.acetaminophen **OR** acetaminophen  Antibiotics  :    Anti-infectives (From admission, onward)    Start     Dose/Rate Route Frequency Ordered Stop   04/28/23 1115  amoxicillin-clavulanate (AUGMENTIN) 875-125 MG per tablet 1 tablet        1 tablet Oral Every 12 hours 04/28/23 1105           Objective:   Vitals:   04/29/23 0137 04/29/23 0413 04/29/23 0800 04/29/23 0817  BP:  (!) 104/50 101/79   Pulse:  80 75 74  Resp:  (!) 22 20 (!) 21  Temp:  97.8 F (36.6 C) 98 F (36.7 C)   TempSrc:  Axillary Axillary   SpO2: 90% 91% 91% 91%  Weight:      Height:        Wt Readings from Last 3 Encounters:  04/27/23 86.6 kg  01/17/23 86.6 kg  07/19/22 85.3 kg     Intake/Output Summary (Last 24 hours) at 04/29/2023 1043 Last data filed at 04/28/2023 2000 Gross per 24 hour  Intake --  Output 150 ml  Net -150 ml     Physical Exam  Awake Alert, No new F.N deficits, Normal affect [place].AT,PERRAL Supple Neck, No JVD,   Symmetrical Chest wall movement, Good air movement bilaterally, +ve rales RRR,No Gallops,Rubs or new Murmurs,  +ve B.Sounds, Abd Soft, No tenderness,   No Cyanosis, Clubbing or edema      Data Review:    Recent Labs  Lab 04/27/23 1156 04/29/23 0546  WBC 7.5 10.6*  HGB 15.4 14.3  HCT 49.3 46.5  PLT 159 134*  MCV 103.6* 103.3*  MCH 32.4 31.8  MCHC 31.2 30.8  RDW 14.8 14.6  LYMPHSABS  --   0.3*  MONOABS  --  0.4  EOSABS  --  0.0  BASOSABS  --  0.0    Recent Labs  Lab 04/27/23 1155 04/27/23 1327 04/28/23 0446 04/29/23 0546 04/29/23 0715  NA  --  148* 148* 147*  --   K  --  4.5 3.8 4.3  --   CL  --  103 103 105  --   CO2  --  37* 35* 31  --   ANIONGAP  --  8 10 11   --   GLUCOSE  --  179* 134* 203*  --   BUN  --  22 22 39*  --   CREATININE  --  1.21 1.32* 1.53*  --  AST  --  14*  --   --   --   ALT  --  9  --   --   --   ALKPHOS  --  54  --   --   --   BILITOT  --  1.2*  --   --   --   ALBUMIN  --  3.3*  --   --   --   CRP  --   --   --   --  7.6*  PROCALCITON  --   --   --  <0.10  --   HGBA1C  --   --  7.7*  --   --   BNP 116.9*  --   --  114.8*  --   MG  --   --   --  2.0  --   CALCIUM  --  8.8* 8.7* 8.8*  --       Recent Labs  Lab 04/27/23 1155 04/27/23 1327 04/28/23 0446 04/29/23 0546 04/29/23 0715  CRP  --   --   --   --  7.6*  PROCALCITON  --   --   --  <0.10  --   HGBA1C  --   --  7.7*  --   --   BNP 116.9*  --   --  114.8*  --   MG  --   --   --  2.0  --   CALCIUM  --  8.8* 8.7* 8.8*  --     --------------------------------------------------------------------------------------------------------------- Lab Results  Component Value Date   CHOL 140 03/14/2022   HDL 44 03/14/2022   LDLCALC 66 03/14/2022   TRIG 181 (H) 03/14/2022   CHOLHDL 3.2 03/14/2022    Lab Results  Component Value Date   HGBA1C 7.7 (H) 04/28/2023   Radiology Reports DG Chest Port 1 View  Result Date: 04/29/2023 CLINICAL DATA:  Shortness of breath EXAM: PORTABLE CHEST 1 VIEW COMPARISON:  04/27/2023 FINDINGS: Asymmetric elevation right hemidiaphragm. The cardio pericardial silhouette is enlarged. There is pulmonary vascular congestion without overt pulmonary edema. Left base collapse/consolidation with small to moderate left pleural effusion. IMPRESSION: 1. Interval progression of left base collapse/consolidation with small to moderate left pleural effusion. 2.  Pulmonary vascular congestion without overt pulmonary edema. Electronically Signed   By: Kennith Center M.D.   On: 04/29/2023 08:50   ECHOCARDIOGRAM COMPLETE  Result Date: 04/28/2023    ECHOCARDIOGRAM REPORT   Patient Name:   Crockett MOHSEN HELMER Date of Exam: 04/28/2023 Medical Rec #:  [id number]       Height:       70.0 in Accession #:    [id number]      Weight:       190.9 lb Date of Birth:  February 21, 1941       BSA:          2.047 m Patient Age:    82 years        BP:           131/71 mmHg Patient Gender: M               HR:           79 bpm. Exam Location:  Inpatient Procedure: 2D Echo, Color Doppler, Cardiac Doppler and Intracardiac            Opacification Agent Indications:    Pulmonary Hypertension  History:        Patient has prior history of Echocardiogram  examinations, most                 recent 01/21/2022. NSTEMI; Risk Factors:Sleep Apnea, Dyslipidemia,                 Hypertension and Diabetes.  Sonographer:    [facility] Referring Phys: [id number] VASUNDHRA RATHORE IMPRESSIONS  1. Left ventricular ejection fraction, by estimation, is 60 to 65%. The left ventricle has normal function. The left ventricle has no regional wall motion abnormalities. There is moderate left ventricular hypertrophy. Left ventricular diastolic parameters are consistent with Grade I diastolic dysfunction (impaired relaxation).  2. Right ventricular systolic function is mildly reduced. The right ventricular size is normal.  3. The mitral valve is normal in structure. No evidence of mitral valve regurgitation. No evidence of mitral stenosis.  4. The aortic valve is tricuspid. There is mild calcification of the aortic valve. There is mild thickening of the aortic valve. Aortic valve regurgitation is trivial. Aortic valve sclerosis is present, with no evidence of aortic valve stenosis.  5. Aortic dilatation noted. There is mild dilatation of the ascending aorta, measuring 41 mm.  6. The inferior vena cava is normal in size with greater than 50%  respiratory variability, suggesting right atrial pressure of 3 mmHg. FINDINGS  Left Ventricle: Left ventricular ejection fraction, by estimation, is 60 to 65%. The left ventricle has normal function. The left ventricle has no regional wall motion abnormalities. Definity contrast agent was given IV to delineate the left ventricular  endocardial borders. The left ventricular internal cavity size was normal in size. There is moderate left ventricular hypertrophy. Left ventricular diastolic parameters are consistent with Grade I diastolic dysfunction (impaired relaxation). Right Ventricle: The right ventricular size is normal. No increase in right ventricular wall thickness. Right ventricular systolic function is mildly reduced. Left Atrium: Left atrial size was normal in size. Right Atrium: Right atrial size was normal in size. Pericardium: There is no evidence of pericardial effusion. Mitral Valve: The mitral valve is normal in structure. No evidence of mitral valve regurgitation. No evidence of mitral valve stenosis. Tricuspid Valve: The tricuspid valve is normal in structure. Tricuspid valve regurgitation is not demonstrated. No evidence of tricuspid stenosis. Aortic Valve: The aortic valve is tricuspid. There is mild calcification of the aortic valve. There is mild thickening of the aortic valve. Aortic valve regurgitation is trivial. Aortic valve sclerosis is present, with no evidence of aortic valve stenosis. Aortic valve mean gradient measures 5.0 mmHg. Aortic valve peak gradient measures 9.9 mmHg. Aortic valve area, by VTI measures 2.86 cm. Pulmonic Valve: The pulmonic valve was normal in structure. Pulmonic valve regurgitation is not visualized. No evidence of pulmonic stenosis. Aorta: Aortic dilatation noted. There is mild dilatation of the ascending aorta, measuring 41 mm. Venous: The inferior vena cava is normal in size with greater than 50% respiratory variability, suggesting right atrial pressure of 3  mmHg. IAS/Shunts: The interatrial septum was not well visualized.  LEFT VENTRICLE PLAX 2D LVIDd:         4.40 cm   Diastology LVIDs:         3.00 cm   LV e' medial:    6.96 cm/s LV PW:         1.20 cm   LV E/e' medial:  10.6 LV IVS:        1.40 cm   LV e' lateral:   11.10 cm/s LVOT diam:     2.10 cm   LV E/e' lateral: 6.7 LV  SV:         91 LV SV Index:   45 LVOT Area:     3.46 cm  RIGHT VENTRICLE RV Basal diam:  3.10 cm RV Mid diam:    3.40 cm RV S prime:     14.80 cm/s TAPSE (M-mode): 1.9 cm LEFT ATRIUM             Index        RIGHT ATRIUM           Index LA diam:        3.80 cm 1.86 cm/m   RA Area:     18.80 cm LA Vol (A2C):   49.3 ml 24.09 ml/m  RA Volume:   51.80 ml  25.31 ml/m LA Vol (A4C):   36.9 ml 18.03 ml/m LA Biplane Vol: 44.1 ml 21.55 ml/m  AORTIC VALVE AV Area (Vmax):    2.85 cm AV Area (Vmean):   2.91 cm AV Area (VTI):     2.86 cm AV Vmax:           157.00 cm/s AV Vmean:          101.000 cm/s AV VTI:            0.320 m AV Peak Grad:      9.9 mmHg AV Mean Grad:      5.0 mmHg LVOT Vmax:         129.00 cm/s LVOT Vmean:        84.900 cm/s LVOT VTI:          0.264 m LVOT/AV VTI ratio: 0.82  AORTA Ao Root diam: 4.10 cm Ao Asc diam:  4.10 cm MITRAL VALVE               TRICUSPID VALVE MV Area (PHT): 3.42 cm    TR Peak grad:   34.6 mmHg MV Decel Time: 222 msec    TR Vmax:        294.00 cm/s MV E velocity: 73.90 cm/s MV A velocity: 96.40 cm/s  SHUNTS MV E/A ratio:  0.77        Systemic VTI:  0.26 m                            Systemic Diam: 2.10 cm Charlton Haws MD Electronically signed by Charlton Haws MD Signature Date/Time: 04/28/2023/11:38:38 AM    Final    CT Angio Chest PE W and/or Wo Contrast  Result Date: 04/27/2023 CLINICAL DATA:  Shortness of breath and weakness EXAM: CT ANGIOGRAPHY CHEST WITH CONTRAST TECHNIQUE: Multidetector CT imaging of the chest was performed using the standard protocol during bolus administration of intravenous contrast. Multiplanar CT image reconstructions and MIPs  were obtained to evaluate the vascular anatomy. RADIATION DOSE REDUCTION: This exam was performed according to the departmental dose-optimization program which includes automated exposure control, adjustment of the mA and/or kV according to patient size and/or use of iterative reconstruction technique. CONTRAST:  75mL OMNIPAQUE IOHEXOL 350 MG/ML SOLN COMPARISON:  Plain film of earlier today.  CTA chest 01/27/2022. FINDINGS: Cardiovascular: The quality of this exam for evaluation of pulmonary embolism is moderate. The bolus is centered in the [facility] to the and there is minimal motion degradation. No evidence of pulmonary embolism. Aortic atherosclerosis. Mild ascending aortic dilatation including at 4.2 cm, similar. Tortuous descending thoracic aorta. Mild cardiomegaly, without pericardial effusion. Lad and right coronary artery calcification. Pulmonary artery enlargement, outflow tract 3.7 cm.  Mediastinum/Nodes: No mediastinal or hilar adenopathy. Lungs/Pleura: Small right pleural effusion, minimally increased. Moderate right hemidiaphragm elevation. VP shunt catheter again identified about the right chest wall, terminating in the right pleural space inferiorly. Right greater than left base volume loss and airspace disease, all favored to represent atelectasis and scar. Upper Abdomen: small dependent gallstones. Normal imaged portions of the liver, spleen, stomach, pancreas, adrenal glands. Musculoskeletal: No acute osseous abnormality. Review of the MIP images confirms the above findings. IMPRESSION: 1. No pulmonary embolism with above limitations. 2. Moderate to marked right hemidiaphragm elevation with persistent small right pleural effusion and similar bibasilar volume loss with atelectasis and scar. 3. Similar mild ascending aortic dilatation at 4.2 cm. Recommend annual imaging followup by CTA or MRA. This recommendation follows 2010 ACCF/AHA/AATS/ACR/ASA/SCA/SCAI/SIR/STS/SVM Guidelines for the Diagnosis and  Management of Patients with Thoracic Aortic Disease. Circulation. 2010; 121: Z610-R604. Aortic aneurysm NOS (ICD10-I71.9) 4. Pulmonary artery enlargement suggests pulmonary arterial hypertension. 5. Cholelithiasis 6. Aortic Atherosclerosis (ICD10-I70.0). Coronary artery atherosclerosis. Electronically Signed   By: Jeronimo Greaves M.D.   On: 04/27/2023 17:34   DG Chest Port 1 View  Result Date: 04/27/2023 CLINICAL DATA:  Shortness of breath.  Fatigue. EXAM: PORTABLE CHEST 1 VIEW COMPARISON:  01/27/2022. FINDINGS: Low lung volume. There are probable atelectatic changes/scarring at the lung bases. No acute consolidation or lung collapse. Apparent blunting of bilateral lateral costophrenic angles may be due to trace pleural effusion versus superimposed soft tissue. No pneumothorax. Stable cardio-mediastinal silhouette. No acute osseous abnormalities. The soft tissues are within normal limits. Presumed VP shunt tubing noted along the right lateral chest. IMPRESSION: *Bibasilar atelectasis/scarring. No acute consolidation or lung collapse. Electronically Signed   By: Jules Schick M.D.   On: 04/27/2023 14:24      Signature  -   Susa Raring M.D on 04/29/2023 at 10:43 AM   -  To page go to [email or web address]

## 2023-04-29 NOTE — Evaluation (Signed)
Physical Therapy Evaluation Patient Details Name: Jeremiah [name] MRN: [id number] DOB: [birth year] Today's Date: 04/29/2023  History of Present Illness  The pt is an 82 yo male presenting 12/6 with SOB and weakness x3 weeks. Admitted for acute on chronic resp failure with hypoxia. PMH: CAD status post PCI, chronic respiratory failure on O2 2 L,  NPH s/p VP shunt, diabetes mellitus type 2, HLP, HTN, BPH, CKD stage IIIa, OSA on CPAP, prostate CA, skin CA.   Clinical Impression  Pt in bed upon arrival of PT, agreeable to evaluation at this time. Prior to admission the pt was independent with mobility in the home without use of DME and with inconsistent use of O2. The pt was on 12L O2 upon my arrival with SpO2 91%, attempted to decrease with pt at rest and VSS until 4L (low of 84%). The pt was stable on 6L O2 at rest and initially with walking, but after 176ft, SpO2 of 87% and therefore increased to 8L O2 with VSS. Pt left on 6L at rest with RN aware. The pt was able to complete sit-stand transfers with CGA but no UE support and good stability. He used the IV pole with ambulation and reports gait is slowed compared to baseline. Will continue to benefit from skilled PT to progress endurance, stability, and intensity both acutely and after d/c home to facilitate return to baseline independence.       If plan is discharge home, recommend the following: A little help with walking and/or transfers;Assistance with cooking/housework;Assist for transportation;Help with stairs or ramp for entrance   Can travel by private vehicle        Equipment Recommendations None recommended by PT  Recommendations for Other Services       Functional Status Assessment Patient has had a recent decline in their functional status and demonstrates the ability to make significant improvements in function in a reasonable and predictable amount of time.     Precautions / Restrictions Precautions Precautions: Fall Precaution  Comments: watch O2, on 6L Restrictions Weight Bearing Restrictions: No      Mobility  Bed Mobility               General bed mobility comments: up in recliner at start and end of session    Transfers Overall transfer level: Needs assistance Equipment used: None Transfers: Sit to/from Stand Sit to Stand: Contact guard assist           General transfer comment: CGA for safety, no overt LOB with static stance    Ambulation/Gait Ambulation/Gait assistance: Contact guard assist Gait Distance (Feet): 100 Feet (+ 100 ft) Assistive device: IV Pole Gait Pattern/deviations: Step-through pattern Gait velocity: decreased Gait velocity interpretation: <1.31 ft/sec, indicative of household ambulator   General Gait Details: slight trunk flexed, slowed gait with slight lateral sway. no overt LOB. SpO2 97% on 6L after 161ft, increased to 8L with VSS     Balance Overall balance assessment: Needs assistance Sitting-balance support: No upper extremity supported, Feet supported Sitting balance-Leahy Scale: Good     Standing balance support: Bilateral upper extremity supported, During functional activity Standing balance-Leahy Scale: Fair Standing balance comment: Pt reports feeling steadier with rw.                             Pertinent Vitals/Pain Pain Assessment Pain Assessment: No/denies pain    Home Living Family/patient expects to be discharged to:: Private residence Living Arrangements: Spouse/significant  other Available Help at Discharge: Family Type of Home: House Home Access: Stairs to enter Entrance Stairs-Rails: Right;Left;Can reach both Entrance Stairs-Number of Steps: 2   Home Layout: Multi-level;Able to live on main level with bedroom/bathroom Home Equipment: Educational psychologist (4 wheels);Grab bars - tub/shower Additional Comments: spouse has dementia    Prior Function Prior Level of Function : Independent/Modified Independent;Driving              Mobility Comments: pt reports a few falls recently, no injuries. is able to get up. intermittent use of O2. graduated cardiac rehab 6 months ago and reports sedentary since then ADLs Comments: drives, helps spouse with dementia     Extremity/Trunk Assessment   Upper Extremity Assessment Upper Extremity Assessment: Defer to OT evaluation    Lower Extremity Assessment Lower Extremity Assessment: Overall WFL for tasks assessed    Cervical / Trunk Assessment Cervical / Trunk Assessment: Normal  Communication   Communication Communication: No apparent difficulties  Cognition Arousal: Alert Behavior During Therapy: WFL for tasks assessed/performed Overall Cognitive Status: Within Functional Limits for tasks assessed                                          General Comments General comments (skin integrity, edema, etc.): pt on 12L O2 at start of session, to low of 84% on 4L, improved with 6L to low 90s at rest. Pt initially stable on 6L with ambulation but SpO2 to 87% after 100 ft, increased to 8L with VSS        Assessment/Plan    PT Assessment Patient needs continued PT services  PT Problem List Cardiopulmonary status limiting activity;Decreased strength;Decreased activity tolerance;Decreased balance;Decreased mobility       PT Treatment Interventions DME instruction;Gait training;Stair training;Functional mobility training;Therapeutic activities;Therapeutic exercise;Balance training    PT Goals (Current goals can be found in the Care Plan section)  Acute Rehab PT Goals Patient Stated Goal: to be able to walk in his home, decrease O2 PT Goal Formulation: With patient Time For Goal Achievement: 05/13/23 Potential to Achieve Goals: Good    Frequency Min 1X/week        AM-PAC PT "6 Clicks" Mobility  Outcome Measure Help needed turning from your back to your side while in a flat bed without using bedrails?: None Help needed moving from  lying on your back to sitting on the side of a flat bed without using bedrails?: A Little Help needed moving to and from a bed to a chair (including a wheelchair)?: A Little Help needed standing up from a chair using your arms (e.g., wheelchair or bedside chair)?: A Little Help needed to walk in hospital room?: A Little Help needed climbing 3-5 steps with a railing? : A Little 6 Click Score: 19    End of Session Equipment Utilized During Treatment: Gait belt;Oxygen Activity Tolerance: Patient tolerated treatment well Patient left: in chair;with call bell/phone within reach;with family/visitor present Nurse Communication: Mobility status (O2) PT Visit Diagnosis: Unsteadiness on feet (R26.81);Other abnormalities of gait and mobility (R26.89)    Time: 6644-0347 PT Time Calculation (min) (ACUTE ONLY): 27 min   Charges:   PT Evaluation $PT Eval Moderate Complexity: 1 Mod PT Treatments $Gait Training: 8-22 mins PT General Charges $$ ACUTE PT VISIT: 1 Visit         Vickki Muff, PT, DPT   Acute Rehabilitation Department Office [phone] Secure Chat  Communication Preferred  Ronnie Derby 04/29/2023, 5:17 PM

## 2023-04-29 NOTE — Evaluation (Signed)
Occupational Therapy Evaluation Patient Details Name: Jeremiah [name] MRN: [id number] DOB: 05-20-1941 Today's Date: 04/29/2023   History of Present Illness Patient is a 82 year old male presented to ED with shortness of breath and hypoxia. PMH: CAD status post PCI, chronic respiratory failure on O2 2 L,  NPH s/p VP shunt, diabetes mellitus type 2, HLP, HTN, BPH, CKD stage IIIa, OSA on CPAP, prostate CA, skin CA.   Clinical Impression   Pt admitted with the above diagnoses and presents with below problem list. Pt will benefit from continued acute OT to address the below listed deficits and maximize independence with basic ADLs prior to d/c. At baseline, pt is independent with ADLs. Lives with his spouse who has dementia. Pt currently needs up to contact guard assist with LB ADLs and functional transfers/mobility. Pt utilized rw this session. Pt on 12L high flow O2 at start and end of session. During mobility pt on 6L O2 with sats mostly in the low 90s occasioanl dip into low 80s but recovered in a timely manner with breathing technique and brief rest break.        If plan is discharge home, recommend the following: A little help with walking and/or transfers;A little help with bathing/dressing/bathroom    Functional Status Assessment  Patient has had a recent decline in their functional status and demonstrates the ability to make significant improvements in function in a reasonable and predictable amount of time.  Equipment Recommendations  None recommended by OT    Recommendations for Other Services PT consult     Precautions / Restrictions Precautions Precautions: Fall      Mobility Bed Mobility               General bed mobility comments: up in recliner    Transfers Overall transfer level: Needs assistance Equipment used: Rolling walker (2 wheels) Transfers: Sit to/from Stand Sit to Stand: Contact guard assist           General transfer comment: to/from recliner.  cues for hand placement with rw. CGA.      Balance Overall balance assessment: Needs assistance Sitting-balance support: No upper extremity supported, Feet supported Sitting balance-Leahy Scale: Good     Standing balance support: Bilateral upper extremity supported, During functional activity Standing balance-Leahy Scale: Fair Standing balance comment: Pt reports feeling steadier with rw.                           ADL either performed or assessed with clinical judgement   ADL Overall ADL's : Needs assistance/impaired Eating/Feeding: Modified independent;Sitting   Grooming: Set up;Supervision/safety;Standing Grooming Details (indicate cue type and reason): rw Upper Body Bathing: Set up;Sitting   Lower Body Bathing: Sit to/from stand;Contact guard assist   Upper Body Dressing : Set up;Sitting   Lower Body Dressing: Contact guard assist;Sit to/from stand   Toilet Transfer: Contact guard assist;Ambulation;Rolling walker (2 wheels)   Toileting- Clothing Manipulation and Hygiene: Contact guard assist;Sit to/from stand   Tub/ Shower Transfer: Walk-in shower;Contact guard assist;Ambulation   Functional mobility during ADLs: Contact guard assist;Rolling walker (2 wheels) General ADL Comments: Pt walked household distances this session using rw for balance (baseline no AD).     Vision         Perception         Praxis         Pertinent Vitals/Pain Pain Assessment Pain Assessment: No/denies pain     Extremity/Trunk Assessment Upper Extremity Assessment Upper  Extremity Assessment: Overall WFL for tasks assessed   Lower Extremity Assessment Lower Extremity Assessment: Defer to PT evaluation       Communication Communication Communication: No apparent difficulties   Cognition Arousal: Alert Behavior During Therapy: WFL for tasks assessed/performed Overall Cognitive Status: Within Functional Limits for tasks assessed                                        General Comments  Pt on 12L high flow O2 at start and end of session. During mobility pt on 6L O2 with sats mostly in the low 90s occasioanl dip into low 80s but recovered in a timely manner with breathing technique and brief rest break.    Exercises     Shoulder Instructions      Home Living Family/patient expects to be discharged to:: Private residence Living Arrangements: Spouse/significant other Available Help at Discharge: Family Type of Home: House Home Access: Stairs to enter [employer] of Steps: 2 Entrance Stairs-Rails: Right;Left;Can reach both Home Layout: Multi-level;Able to live on main level with bedroom/bathroom     Bathroom Shower/Tub: Walk-in shower         Home Equipment: Educational psychologist (4 wheels);Grab bars - tub/shower   Additional Comments: spouse has dementia      Prior Functioning/Environment Prior Level of Function : Independent/Modified Independent               ADLs Comments: drives, helps spouse with dementia        OT Problem List: Decreased activity tolerance;Impaired balance (sitting and/or standing);Decreased knowledge of use of DME or AE;Decreased knowledge of precautions;Cardiopulmonary status limiting activity      OT Treatment/Interventions: Self-care/ADL training;Therapeutic exercise;Energy conservation;DME and/or AE instruction;Therapeutic activities;Balance training;Patient/family education    OT Goals(Current goals can be found in the care plan section) Acute Rehab OT Goals Patient Stated Goal: home, improve standing balance OT Goal Formulation: With patient Time For Goal Achievement: 05/13/23 Potential to Achieve Goals: Good ADL Goals Pt Will Perform Grooming: with modified independence;standing Pt Will Perform Lower Body Bathing: with modified independence;sit to/from stand Pt Will Perform Lower Body Dressing: with modified independence;sit to/from stand Pt Will Transfer to Toilet:  with modified independence;ambulating Pt Will Perform Toileting - Clothing Manipulation and hygiene: with modified independence;sit to/from stand Pt Will Perform Tub/Shower Transfer: Shower transfer;with modified independence;ambulating;rolling walker  OT Frequency: Min 2X/week    Co-evaluation              AM-PAC OT "6 Clicks" Daily Activity     Outcome Measure Help from another person eating meals?: None Help from another person taking care of personal grooming?: None Help from another person toileting, which includes using toliet, bedpan, or urinal?: A Little Help from another person bathing (including washing, rinsing, drying)?: A Little Help from another person to put on and taking off regular upper body clothing?: None Help from another person to put on and taking off regular lower body clothing?: A Little 6 Click Score: 21   End of Session Equipment Utilized During Treatment: Rolling walker (2 wheels);Oxygen Nurse Communication: Mobility status;Other (comment) (O2 sats and supplemental O2 needs during session)  Activity Tolerance: Patient tolerated treatment well Patient left: in chair;with call bell/phone within reach  OT Visit Diagnosis: Unsteadiness on feet (R26.81);Muscle weakness (generalized) (M62.81)                Time: 4540-9811 OT Time  Calculation (min): 22 min Charges:  OT General Charges $OT Visit: 1 Visit OT Evaluation $OT Eval Low Complexity: 1 Low  Raynald Kemp, OT Acute Rehabilitation Services Office: [phone]   Pilar Grammes 04/29/2023, 10:52 AM

## 2023-04-30 ENCOUNTER — Inpatient Hospital Stay (HOSPITAL_COMMUNITY): Payer: Medicare Other

## 2023-04-30 DIAGNOSIS — J9621 Acute and chronic respiratory failure with hypoxia: Secondary | ICD-10-CM | POA: Diagnosis not present

## 2023-04-30 LAB — CBC WITH DIFFERENTIAL/PLATELET
Abs Immature Granulocytes: 0.09 10*3/uL — ABNORMAL HIGH (ref 0.00–0.07)
Basophils Absolute: 0 10*3/uL (ref 0.0–0.1)
Basophils Relative: 0 %
Eosinophils Absolute: 0 10*3/uL (ref 0.0–0.5)
Eosinophils Relative: 0 %
HCT: 48.9 % (ref 39.0–52.0)
Hemoglobin: 14.9 g/dL (ref 13.0–17.0)
Immature Granulocytes: 1 %
Lymphocytes Relative: 4 %
Lymphs Abs: 0.4 10*3/uL — ABNORMAL LOW (ref 0.7–4.0)
MCH: 31.9 pg (ref 26.0–34.0)
MCHC: 30.5 g/dL (ref 30.0–36.0)
MCV: 104.7 fL — ABNORMAL HIGH (ref 80.0–100.0)
Monocytes Absolute: 0.6 10*3/uL (ref 0.1–1.0)
Monocytes Relative: 6 %
Neutro Abs: 9.4 10*3/uL — ABNORMAL HIGH (ref 1.7–7.7)
Neutrophils Relative %: 89 %
Platelets: 143 10*3/uL — ABNORMAL LOW (ref 150–400)
RBC: 4.67 MIL/uL (ref 4.22–5.81)
RDW: 14.4 % (ref 11.5–15.5)
WBC: 10.4 10*3/uL (ref 4.0–10.5)
nRBC: 0 % (ref 0.0–0.2)

## 2023-04-30 LAB — BASIC METABOLIC PANEL
Anion gap: 8 (ref 5–15)
BUN: 53 mg/dL — ABNORMAL HIGH (ref 8–23)
CO2: 36 mmol/L — ABNORMAL HIGH (ref 22–32)
Calcium: 8.5 mg/dL — ABNORMAL LOW (ref 8.9–10.3)
Chloride: 97 mmol/L — ABNORMAL LOW (ref 98–111)
Creatinine, Ser: 2.14 mg/dL — ABNORMAL HIGH (ref 0.61–1.24)
GFR, Estimated: 30 mL/min — ABNORMAL LOW (ref >60)
Glucose, Bld: 193 mg/dL — ABNORMAL HIGH (ref 70–99)
Potassium: 4.5 mmol/L (ref 3.5–5.1)
Sodium: 141 mmol/L (ref 135–145)

## 2023-04-30 LAB — URIC ACID: Uric Acid, Serum: 7 mg/dL (ref 3.7–8.6)

## 2023-04-30 LAB — RESPIRATORY PANEL BY PCR

## 2023-04-30 LAB — C-REACTIVE PROTEIN: CRP: 4.6 mg/dL — ABNORMAL HIGH (ref <1.0)

## 2023-04-30 LAB — PROCALCITONIN: Procalcitonin: <0.1 ng/mL

## 2023-04-30 LAB — MAGNESIUM: Magnesium: 2.2 mg/dL (ref 1.7–2.4)

## 2023-04-30 LAB — OSMOLALITY: Osmolality: 325 mosm/kg (ref 275–295)

## 2023-04-30 LAB — BRAIN NATRIURETIC PEPTIDE: B Natriuretic Peptide: 53.1 pg/mL (ref 0.0–100.0)

## 2023-04-30 LAB — GLUCOSE, CAPILLARY
Glucose-Capillary: 173 mg/dL — ABNORMAL HIGH (ref 70–99)
Glucose-Capillary: 237 mg/dL — ABNORMAL HIGH (ref 70–99)
Glucose-Capillary: 165 mg/dL — ABNORMAL HIGH (ref 70–99)
Glucose-Capillary: 238 mg/dL — ABNORMAL HIGH (ref 70–99)

## 2023-04-30 MED ORDER — AMOXICILLIN-POT CLAVULANATE 500-125 MG PO TABS
1.0000 | ORAL_TABLET | Freq: Two times a day (BID) | ORAL | Status: DC
  Administered 2023-04-30 – 2023-05-03 (×6): 1 via ORAL
  Filled 2023-04-30 (×7): qty 1

## 2023-04-30 MED ORDER — IPRATROPIUM-ALBUTEROL 0.5-2.5 (3) MG/3ML IN SOLN
3.0000 mL | Freq: Two times a day (BID) | RESPIRATORY_TRACT | Status: DC
  Administered 2023-04-30 – 2023-05-03 (×6): 3 mL via RESPIRATORY_TRACT
  Filled 2023-04-30 (×6): qty 3

## 2023-04-30 MED ORDER — CARVEDILOL 6.25 MG PO TABS: 6.2500 mg | ORAL_TABLET | Freq: Two times a day (BID) | ORAL | Status: DC

## 2023-04-30 MED ORDER — ENOXAPARIN SODIUM 30 MG/0.3ML IJ SOSY
30.0000 mg | PREFILLED_SYRINGE | INTRAMUSCULAR | Status: DC
  Administered 2023-05-01: 30 mg via SUBCUTANEOUS
  Filled 2023-04-30: qty 0.3

## 2023-04-30 MED ORDER — CARVEDILOL 3.125 MG PO TABS
3.1250 mg | ORAL_TABLET | Freq: Two times a day (BID) | ORAL | Status: DC
  Administered 2023-04-30 – 2023-05-03 (×6): 3.125 mg via ORAL
  Filled 2023-04-30 (×6): qty 1

## 2023-04-30 MED ORDER — LACTATED RINGERS IV SOLN: INTRAVENOUS | Status: AC

## 2023-04-30 NOTE — Progress Notes (Signed)
PHARMACY NOTE:  ANTIMICROBIAL RENAL DOSAGE ADJUSTMENT  Current antimicrobial regimen includes a mismatch between antimicrobial dosage and estimated renal function.  As per policy approved by the Pharmacy & Therapeutics and Medical Executive Committees, the antimicrobial dosage will be adjusted accordingly.  Current antimicrobial dosage:  Augmentin 875mg  BID  Indication: PNA  Renal Function:  Estimated Creatinine Clearance: 27 mL/min (A) (by C-G formula based on SCr of 2.14 mg/dL (H)). []      On intermittent HD, scheduled: []      On CRRT    Antimicrobial dosage has been changed to:  Augmentin 500mg  PO BID  Additional comments:  Ulyses Southward, PharmD, BCIDP, AAHIVP, CPP Infectious Disease Pharmacist 04/30/2023 7:18 PM

## 2023-04-30 NOTE — Progress Notes (Signed)
Mobility Specialist Progress Note;   04/30/23 1455  Mobility  Activity Ambulated with assistance in hallway  Level of Assistance Contact guard assist, steadying assist  Assistive Device None  Distance Ambulated (ft) 200 ft  Activity Response Tolerated well  Mobility Referral Yes  Mobility visit 1 Mobility  Mobility Specialist Start Time (ACUTE ONLY) 1455  Mobility Specialist Stop Time (ACUTE ONLY) 1510  Mobility Specialist Time Calculation (min) (ACUTE ONLY) 15 min    Pre-mobility: SPO2 93% 8LO2 During-mobility: SPO2 80-95% 10LO2 Post-mobility: SPO2 96% 8LO2  Pt eager for more mobility this afternoon. On 8LO2 upon arrival. Required MinG assistance during ambulation for safety. Ambulated on 10LO2 to stay [facility]. Would desat to ~80% while ambulating on 10LO2; however recovered quickly with standing rest break (2x) and PLB encouraged. No display of SOB. Pt returned back to chair with all needs met, on 8LO2.  Caesar Bookman Mobility Specialist Please contact via SecureChat or Rehab Office [phone]

## 2023-04-30 NOTE — Progress Notes (Signed)
PROGRESS NOTE                                                                                                                                                                                                             Patient Demographics:    Jeremiah [name], is a 82 y.o. male, DOB - 1940-06-22, ZOX:[id number]  Outpatient Primary MD for the patient is Rodrigo Ran, MD    LOS - 2  Admit date - 04/27/2023    Chief Complaint  Patient presents with   Shortness of Breath   Weakness       Brief Narrative (HPI from H&P)   82 year old male with CAD status post PCI, chronic respiratory failure on O2 2 L - Pulm HTN,  NPH s/p VP shunt, diabetes mellitus type 2, HLP, HTN, BPH, CKD stage IIIa, OSA on CPAP, prostate CA, skin CA presented to ED with shortness of breath and hypoxia.  Patient has not been using his home O2.  Caregiver went over to his house and noted that his O2 sats were in the 70s on room air and cyanotic lips.  When EMS arrived, he was 80% on room air and was placed on 3 L O2 via [place], improved sats to mid 90s.  In ED, patient was noted to be still hypoxic and was placed on 6 L O2 via [place]. Per patient, he has been feeling short of breath for last 1 year, has been on home O2 2 L but does not use it regularly.  Patient is also noncompliant with CPAP.  Lives at home with his wife who has dementia and they have a caregiver who visits 3 times a week.  No chest pain, cough or fevers.   NAD, tachypneic, afebrile, no leukocytosis, troponins negative, creatinine 1.2, BNP 116.  CTA chest negative for PE, moderate to marked right hemidiaphragm elevation with persistent small right pleural effusion, similar bibasilar volume loss, pulmonary arterial hypertension.   Subjective:   Patient in bed, appears comfortable, denies any headache, no fever, no chest pain or pressure, continues to have mild shortness of breath , no abdominal pain. No new  focal weakness.    Assessment  & Plan :    Acute on chronic respiratory failure with hypoxia (HCC) possibly due to acute bronchitis, pulmonary hypertension, atelectasis, pleural effusion.  Likely acute on chronic  diastolic CHF.  EF 60%, H/O Pulm HTN.  Patient is on 2 L home O2 but noncompliant, has OSA with CPAP but noncompliant with CPAP as well.  Short of breath over the last 7 to 10 days, mild orthopnea, BNP slightly elevated, chest x-ray suggestive of mild fluid overload, minimal to no response to IV Lasix on 04/30/2023, also check extended respiratory viral panel.  CTA negative for PE.  I-S and flutter valve for pulmonary toiletry, rest to sit in chair use I-S and flutter valve, tapering IV steroids, question if this is progression of his pulmonary hypertension on some other lung etiology.  Pulmonary will be consulted.  SpO2: 93 % O2 Flow Rate (L/min): 6 L/min      Mild ascending aortic dilation - CT showing similar mild ascending aortic dilation at 4.2 cm.  Recommended annual imaging follow-up with CTA or MRA.  Beta-blocker for now.  Mild hypernatremia.  Resolved with some gentle IV fluids.  CKD stage IIIa  Creatinine 1.2, stable.   OSA Continue nightly CPAP.   CAD status post PCI in September 2023, HTN, hyperlipidemia -Currently no acute chest pain, on Coreg, aspirin, Zetia, DAPT and statin for secondary prevention continue.   Obesity Estimated body mass index is 27.39 kg/m, follow-up with PCP.  Diabetes mellitus type 2, uncontrolled with hyperglycemia  - Hemoglobin A1c 7.7, ISS add Semglee as sugars are high due to steroid use  Lab Results  Component Value Date   HGBA1C 7.7 (H) 04/28/2023   CBG (last 3)  Recent Labs    04/29/23 1626 04/29/23 2128 04/30/23 0842  GLUCAP 203* 266* 173*        Condition -  Guarded  Family Communication  : Son and wife on hospital phone on 04/29/2023, daughter Judeth Cornfield [phone] on 04/30/2023  Code Status :  FULL  Consults  :  Pulmonary  PUD Prophylaxis : PPI   Procedures  :     CT - 1. No pulmonary embolism with above limitations. 2. Moderate to marked right hemidiaphragm elevation with persistent small right pleural effusion and similar bibasilar volume loss with atelectasis and scar. 3. Similar mild ascending aortic dilatation at 4.2 cm. Recommend annual imaging followup by CTA or MRA. 4. Pulmonary artery enlargement suggests pulmonary arterial hypertension. 5. Cholelithiasis 6. Aortic Atherosclerosis (ICD10-I70.0). Coronary artery atherosclerosis.  TTE - 1. Left ventricular ejection fraction, by estimation, is 60 to 65%. The left ventricle has normal function. The left ventricle has no regional wall motion abnormalities. There is moderate left ventricular hypertrophy. Left ventricular diastolic parameters are consistent with Grade I diastolic dysfunction (impaired relaxation).  2. Right ventricular systolic function is mildly reduced. The right ventricular size is normal.  3. The mitral valve is normal in structure. No evidence of mitral valve regurgitation. No evidence of mitral stenosis.  4. The aortic valve is tricuspid. There is mild calcification of the aortic valve. There is mild thickening of the aortic valve. Aortic valve regurgitation is trivial. Aortic valve sclerosis is present, with no evidence of aortic valve stenosis.  5. Aortic dilatation noted. There is mild dilatation of the ascending aorta, measuring 41 mm.  6. The inferior vena cava is normal in size with greater than 50% respiratory variability, suggesting right atrial pressure of 3 mmHg.      Disposition Plan  :    Status is: Inpatient  DVT Prophylaxis  :    enoxaparin (LOVENOX) injection 40 mg Start: 04/28/23 1000   Lab Results  Component Value Date  PLT 143 (L) 04/30/2023    Diet :  Diet Order             Diet Carb Modified Fluid consistency: Thin; Room service appropriate? Yes  Diet effective now                     Inpatient Medications  Scheduled Meds:  amoxicillin-clavulanate  1 tablet Oral Q12H   arformoterol  15 mcg Nebulization BID   aspirin EC  81 mg Oral Daily   budesonide (PULMICORT) nebulizer solution  0.25 mg Nebulization BID   carvedilol  3.125 mg Oral BID   cholecalciferol  2,000 Units Oral Daily   clopidogrel  75 mg Oral Q breakfast   vitamin B-12  500 mcg Oral QODAY   And   vitamin B-12  1,000 mcg Oral QODAY   enoxaparin (LOVENOX) injection  40 mg Subcutaneous Q24H   ezetimibe  10 mg Oral Daily   insulin aspart  0-5 Units Subcutaneous QHS   insulin aspart  0-9 Units Subcutaneous TID WC   insulin glargine-yfgn  12 Units Subcutaneous Daily   ipratropium-albuterol  3 mL Nebulization Q6H   methylPREDNISolone (SOLU-MEDROL) injection  30 mg Intravenous Q24H   pantoprazole  40 mg Oral Q0600   pneumococcal 20-valent conjugate vaccine  0.5 mL Intramuscular Tomorrow-1000   rosuvastatin  10 mg Oral Daily   Continuous Infusions:  lactated ringers 75 mL/hr at 04/30/23 0703   PRN Meds:.acetaminophen **OR** acetaminophen  Antibiotics  :    Anti-infectives (From admission, onward)    Start     Dose/Rate Route Frequency Ordered Stop   04/28/23 1115  amoxicillin-clavulanate (AUGMENTIN) 875-125 MG per tablet 1 tablet        1 tablet Oral Every 12 hours 04/28/23 1105           Objective:   Vitals:   04/30/23 0100 04/30/23 0500 04/30/23 0813 04/30/23 0845  BP: 119/67 (!) 93/50  117/61  Pulse:    71  Resp:    (!) 25  Temp: 97.9 F (36.6 C) 97.8 F (36.6 C)  (!) 97.2 F (36.2 C)  TempSrc: Oral Oral  Oral  SpO2:  92% 93%   Weight:      Height:        Wt Readings from Last 3 Encounters:  04/27/23 86.6 kg  01/17/23 86.6 kg  07/19/22 85.3 kg     Intake/Output Summary (Last 24 hours) at 04/30/2023 1000 Last data filed at 04/30/2023 0300 Gross per 24 hour  Intake 469.19 ml  Output 700 ml  Net -230.81 ml     Physical Exam  Awake Alert, No new F.N deficits, Normal  affect [place].AT,PERRAL Supple Neck, No JVD,   Symmetrical Chest wall movement, Good air movement bilaterally, +ve rales RRR,No Gallops,Rubs or new Murmurs,  +ve B.Sounds, Abd Soft, No tenderness,   No Cyanosis, Clubbing or edema      Data Review:    Recent Labs  Lab 04/27/23 1156 04/29/23 0546 04/30/23 0457  WBC 7.5 10.6* 10.4  HGB 15.4 14.3 14.9  HCT 49.3 46.5 48.9  PLT 159 134* 143*  MCV 103.6* 103.3* 104.7*  MCH 32.4 31.8 31.9  MCHC 31.2 30.8 30.5  RDW 14.8 14.6 14.4  LYMPHSABS  --  0.3* 0.4*  MONOABS  --  0.4 0.6  EOSABS  --  0.0 0.0  BASOSABS  --  0.0 0.0    Recent Labs  Lab 04/27/23 1155 04/27/23 1327 04/28/23 0446 04/29/23 0546 04/29/23  0715 04/30/23 0457  NA  --  148* 148* 147*  --  141  K  --  4.5 3.8 4.3  --  4.5  CL  --  103 103 105  --  97*  CO2  --  37* 35* 31  --  36*  ANIONGAP  --  8 10 11   --  8  GLUCOSE  --  179* 134* 203*  --  193*  BUN  --  22 22 39*  --  53*  CREATININE  --  1.21 1.32* 1.53*  --  2.14*  AST  --  14*  --   --   --   --   ALT  --  9  --   --   --   --   ALKPHOS  --  54  --   --   --   --   BILITOT  --  1.2*  --   --   --   --   ALBUMIN  --  3.3*  --   --   --   --   CRP  --   --   --   --  7.6* 4.6*  PROCALCITON  --   --   --  <0.10  --  <0.10  HGBA1C  --   --  7.7*  --   --   --   BNP 116.9*  --   --  114.8*  --  53.1  MG  --   --   --  2.0  --  2.2  CALCIUM  --  8.8* 8.7* 8.8*  --  8.5*      Recent Labs  Lab 04/27/23 1155 04/27/23 1327 04/28/23 0446 04/29/23 0546 04/29/23 0715 04/30/23 0457  CRP  --   --   --   --  7.6* 4.6*  PROCALCITON  --   --   --  <0.10  --  <0.10  HGBA1C  --   --  7.7*  --   --   --   BNP 116.9*  --   --  114.8*  --  53.1  MG  --   --   --  2.0  --  2.2  CALCIUM  --  8.8* 8.7* 8.8*  --  8.5*    --------------------------------------------------------------------------------------------------------------- Lab Results  Component Value Date   CHOL 140 03/14/2022   HDL 44 03/14/2022    LDLCALC 66 03/14/2022   TRIG 181 (H) 03/14/2022   CHOLHDL 3.2 03/14/2022    Lab Results  Component Value Date   HGBA1C 7.7 (H) 04/28/2023   Radiology Reports DG Chest Port 1 View  Result Date: 04/30/2023 CLINICAL DATA:  82 year old male with history of shortness of breath. EXAM: PORTABLE CHEST 1 VIEW COMPARISON:  Chest x-ray 04/29/2023. FINDINGS: Elevation of the right hemidiaphragm again noted. Bibasilar opacities which may reflect areas of atelectasis and/or consolidation. Substantially improved aeration in the left lung base. No definite pleural effusions. No pneumothorax. No evidence of pulmonary edema. Heart size is normal. The patient is rotated to the right on today's exam, resulting in distortion of the mediastinal contours and reduced diagnostic sensitivity and specificity for mediastinal pathology. Atherosclerotic calcifications are noted in the thoracic aorta. IMPRESSION: 1. Persistent low lung volumes with bibasilar areas of atelectasis and/or consolidation, but with improving aeration in the left lung base, indicative of resolving airspace consolidation. 2. Aortic atherosclerosis. Electronically Signed   By: Trudie Reed M.D.   On: 04/30/2023 07:33  DG Chest Port 1 View  Result Date: 04/29/2023 CLINICAL DATA:  Shortness of breath EXAM: PORTABLE CHEST 1 VIEW COMPARISON:  04/27/2023 FINDINGS: Asymmetric elevation right hemidiaphragm. The cardio pericardial silhouette is enlarged. There is pulmonary vascular congestion without overt pulmonary edema. Left base collapse/consolidation with small to moderate left pleural effusion. IMPRESSION: 1. Interval progression of left base collapse/consolidation with small to moderate left pleural effusion. 2. Pulmonary vascular congestion without overt pulmonary edema. Electronically Signed   By: Kennith Center M.D.   On: 04/29/2023 08:50   ECHOCARDIOGRAM COMPLETE  Result Date: 04/28/2023    ECHOCARDIOGRAM REPORT   Patient Name:   Jeremiah [name] Date of Exam: 04/28/2023 Medical Rec #:  [id number]       Height:       70.0 in Accession #:    [id number]      Weight:       190.9 lb Date of Birth:  1940-07-06       BSA:          2.047 m Patient Age:    72 years        BP:           131/71 mmHg Patient Gender: M               HR:           79 bpm. Exam Location:  Inpatient Procedure: 2D Echo, Color Doppler, Cardiac Doppler and Intracardiac            Opacification Agent Indications:    Pulmonary Hypertension  History:        Patient has prior history of Echocardiogram examinations, most                 recent 01/21/2022. NSTEMI; Risk Factors:Sleep Apnea, Dyslipidemia,                 Hypertension and Diabetes.  Sonographer:    [facility] Referring Phys: [id number] VASUNDHRA RATHORE IMPRESSIONS  1. Left ventricular ejection fraction, by estimation, is 60 to 65%. The left ventricle has normal function. The left ventricle has no regional wall motion abnormalities. There is moderate left ventricular hypertrophy. Left ventricular diastolic parameters are consistent with Grade I diastolic dysfunction (impaired relaxation).  2. Right ventricular systolic function is mildly reduced. The right ventricular size is normal.  3. The mitral valve is normal in structure. No evidence of mitral valve regurgitation. No evidence of mitral stenosis.  4. The aortic valve is tricuspid. There is mild calcification of the aortic valve. There is mild thickening of the aortic valve. Aortic valve regurgitation is trivial. Aortic valve sclerosis is present, with no evidence of aortic valve stenosis.  5. Aortic dilatation noted. There is mild dilatation of the ascending aorta, measuring 41 mm.  6. The inferior vena cava is normal in size with greater than 50% respiratory variability, suggesting right atrial pressure of 3 mmHg. FINDINGS  Left Ventricle: Left ventricular ejection fraction, by estimation, is 60 to 65%. The left ventricle has normal function. The left ventricle has no regional wall  motion abnormalities. Definity contrast agent was given IV to delineate the left ventricular  endocardial borders. The left ventricular internal cavity size was normal in size. There is moderate left ventricular hypertrophy. Left ventricular diastolic parameters are consistent with Grade I diastolic dysfunction (impaired relaxation). Right Ventricle: The right ventricular size is normal. No increase in right ventricular wall thickness. Right ventricular systolic function is mildly reduced. Left Atrium: Left atrial size  was normal in size. Right Atrium: Right atrial size was normal in size. Pericardium: There is no evidence of pericardial effusion. Mitral Valve: The mitral valve is normal in structure. No evidence of mitral valve regurgitation. No evidence of mitral valve stenosis. Tricuspid Valve: The tricuspid valve is normal in structure. Tricuspid valve regurgitation is not demonstrated. No evidence of tricuspid stenosis. Aortic Valve: The aortic valve is tricuspid. There is mild calcification of the aortic valve. There is mild thickening of the aortic valve. Aortic valve regurgitation is trivial. Aortic valve sclerosis is present, with no evidence of aortic valve stenosis. Aortic valve mean gradient measures 5.0 mmHg. Aortic valve peak gradient measures 9.9 mmHg. Aortic valve area, by VTI measures 2.86 cm. Pulmonic Valve: The pulmonic valve was normal in structure. Pulmonic valve regurgitation is not visualized. No evidence of pulmonic stenosis. Aorta: Aortic dilatation noted. There is mild dilatation of the ascending aorta, measuring 41 mm. Venous: The inferior vena cava is normal in size with greater than 50% respiratory variability, suggesting right atrial pressure of 3 mmHg. IAS/Shunts: The interatrial septum was not well visualized.  LEFT VENTRICLE PLAX 2D LVIDd:         4.40 cm   Diastology LVIDs:         3.00 cm   LV e' medial:    6.96 cm/s LV PW:         1.20 cm   LV E/e' medial:  10.6 LV IVS:         1.40 cm   LV e' lateral:   11.10 cm/s LVOT diam:     2.10 cm   LV E/e' lateral: 6.7 LV SV:         91 LV SV Index:   45 LVOT Area:     3.46 cm  RIGHT VENTRICLE RV Basal diam:  3.10 cm RV Mid diam:    3.40 cm RV S prime:     14.80 cm/s TAPSE (M-mode): 1.9 cm LEFT ATRIUM             Index        RIGHT ATRIUM           Index LA diam:        3.80 cm 1.86 cm/m   RA Area:     18.80 cm LA Vol (A2C):   49.3 ml 24.09 ml/m  RA Volume:   51.80 ml  25.31 ml/m LA Vol (A4C):   36.9 ml 18.03 ml/m LA Biplane Vol: 44.1 ml 21.55 ml/m  AORTIC VALVE AV Area (Vmax):    2.85 cm AV Area (Vmean):   2.91 cm AV Area (VTI):     2.86 cm AV Vmax:           157.00 cm/s AV Vmean:          101.000 cm/s AV VTI:            0.320 m AV Peak Grad:      9.9 mmHg AV Mean Grad:      5.0 mmHg LVOT Vmax:         129.00 cm/s LVOT Vmean:        84.900 cm/s LVOT VTI:          0.264 m LVOT/AV VTI ratio: 0.82  AORTA Ao Root diam: 4.10 cm Ao Asc diam:  4.10 cm MITRAL VALVE               TRICUSPID VALVE MV Area (PHT): 3.42 cm    TR  Peak grad:   34.6 mmHg MV Decel Time: 222 msec    TR Vmax:        294.00 cm/s MV E velocity: 73.90 cm/s MV A velocity: 96.40 cm/s  SHUNTS MV E/A ratio:  0.77        Systemic VTI:  0.26 m                            Systemic Diam: 2.10 cm Charlton Haws MD Electronically signed by Charlton Haws MD Signature Date/Time: 04/28/2023/11:38:38 AM    Final    CT Angio Chest PE W and/or Wo Contrast  Result Date: 04/27/2023 CLINICAL DATA:  Shortness of breath and weakness EXAM: CT ANGIOGRAPHY CHEST WITH CONTRAST TECHNIQUE: Multidetector CT imaging of the chest was performed using the standard protocol during bolus administration of intravenous contrast. Multiplanar CT image reconstructions and MIPs were obtained to evaluate the vascular anatomy. RADIATION DOSE REDUCTION: This exam was performed according to the departmental dose-optimization program which includes automated exposure control, adjustment of the mA and/or kV according to  patient size and/or use of iterative reconstruction technique. CONTRAST:  75mL OMNIPAQUE IOHEXOL 350 MG/ML SOLN COMPARISON:  Plain film of earlier today.  CTA chest 01/27/2022. FINDINGS: Cardiovascular: The quality of this exam for evaluation of pulmonary embolism is moderate. The bolus is centered in the [facility] to the and there is minimal motion degradation. No evidence of pulmonary embolism. Aortic atherosclerosis. Mild ascending aortic dilatation including at 4.2 cm, similar. Tortuous descending thoracic aorta. Mild cardiomegaly, without pericardial effusion. Lad and right coronary artery calcification. Pulmonary artery enlargement, outflow tract 3.7 cm. Mediastinum/Nodes: No mediastinal or hilar adenopathy. Lungs/Pleura: Small right pleural effusion, minimally increased. Moderate right hemidiaphragm elevation. VP shunt catheter again identified about the right chest wall, terminating in the right pleural space inferiorly. Right greater than left base volume loss and airspace disease, all favored to represent atelectasis and scar. Upper Abdomen: small dependent gallstones. Normal imaged portions of the liver, spleen, stomach, pancreas, adrenal glands. Musculoskeletal: No acute osseous abnormality. Review of the MIP images confirms the above findings. IMPRESSION: 1. No pulmonary embolism with above limitations. 2. Moderate to marked right hemidiaphragm elevation with persistent small right pleural effusion and similar bibasilar volume loss with atelectasis and scar. 3. Similar mild ascending aortic dilatation at 4.2 cm. Recommend annual imaging followup by CTA or MRA. This recommendation follows 2010 ACCF/AHA/AATS/ACR/ASA/SCA/SCAI/SIR/STS/SVM Guidelines for the Diagnosis and Management of Patients with Thoracic Aortic Disease. Circulation. 2010; 121: Z610-R604. Aortic aneurysm NOS (ICD10-I71.9) 4. Pulmonary artery enlargement suggests pulmonary arterial hypertension. 5. Cholelithiasis 6. Aortic Atherosclerosis  (ICD10-I70.0). Coronary artery atherosclerosis. Electronically Signed   By: Jeronimo Greaves M.D.   On: 04/27/2023 17:34   DG Chest Port 1 View  Result Date: 04/27/2023 CLINICAL DATA:  Shortness of breath.  Fatigue. EXAM: PORTABLE CHEST 1 VIEW COMPARISON:  01/27/2022. FINDINGS: Low lung volume. There are probable atelectatic changes/scarring at the lung bases. No acute consolidation or lung collapse. Apparent blunting of bilateral lateral costophrenic angles may be due to trace pleural effusion versus superimposed soft tissue. No pneumothorax. Stable cardio-mediastinal silhouette. No acute osseous abnormalities. The soft tissues are within normal limits. Presumed VP shunt tubing noted along the right lateral chest. IMPRESSION: *Bibasilar atelectasis/scarring. No acute consolidation or lung collapse. Electronically Signed   By: Jules Schick M.D.   On: 04/27/2023 14:24      Signature  -   Susa Raring M.D on 04/30/2023 at 10:00 AM   -  To page go to [email or web address]

## 2023-04-30 NOTE — Progress Notes (Signed)
Mobility Specialist Progress Note;   04/30/23 0920  Mobility  Activity Ambulated with assistance in hallway  Level of Assistance Contact guard assist, steadying assist  Assistive Device Other (Comment) (IV pole)  Distance Ambulated (ft) 175 ft  Activity Response Tolerated well  Mobility Referral Yes  Mobility visit 1 Mobility  Mobility Specialist Start Time (ACUTE ONLY) 0920  Mobility Specialist Stop Time (ACUTE ONLY) 0940  Mobility Specialist Time Calculation (min) (ACUTE ONLY) 20 min    Pre-mobility: SPO2 91% 8LO2 During-mobility: SPO2 80% 8LO2, >88% 10LO2 Post-mobility: SPO2 92% 8LO2  Pt agreeable to mobility. On 8LO2 upon arrival. Required MinG assistance during ambulation for safety. Ambulated on 10LO2 to stay [facility]. No c/o throughout session. No display of SOB. Pt returned back to chair with all needs met, on 8LO2.   Jeremiah [name] Mobility Specialist Please contact via SecureChat or Rehab Office [phone]

## 2023-04-30 NOTE — Plan of Care (Signed)
  Problem: Education: Goal: Knowledge of General Education information will improve Description Including pain rating scale, medication(s)/side effects and non-pharmacologic comfort measures Outcome: Progressing   

## 2023-04-30 NOTE — Consult Note (Signed)
NAME:  Jeremiah [name], MRN:  [id number], DOB:  [birth year], LOS: 2 ADMISSION DATE:  04/27/2023, CONSULTATION DATE:  04/30/2023 REFERRING MD:  Thedore Mins, CHIEF COMPLAINT:     History of Present Illness:  82 year old male with CAD status post PCI, chronic respiratory failure on O2 2 L - Pulm HTN,  NPH s/p VP shunt, diabetes mellitus type 2, HLP, HTN, BPH, CKD stage IIIa, OSA on CPAP, prostate CA, skin CA presented to ED 04/27/23 with shortness of breath and hypoxia.  Patient has not been using his home O2.  Caregiver went over to his house and noted that his O2 sats were in the 70s on room air and cyanotic lips.  When EMS arrived, he was 80% on room air and was placed on 3 L O2 via [place], improved sats to mid 90s.  In ED, patient was noted to be still hypoxic and was placed on 6 L O2 via [place]. Per patient, he has been feeling short of breath for last 1 year, has been on home O2 2 L but does not use it regularly.  Patient is also noncompliant with CPAP.  Lives at home with his wife who has dementia and they have a caregiver who visits 3 times a week.  No chest pain, cough or fevers. Today in  NAD at rest, tachypneic, afebrile, no leukocytosis, troponins negative, creatinine 1.2, BNP 116.  CTA chest negative for PE, moderate to marked right hemidiaphragm elevation with persistent small right pleural effusion, similar bibasilar volume loss, pulmonary arterial hypertension.  Pulm consult 12/9 for continued dyspnea with everything ruled out.  Pertinent  Medical History  CAD status post PCI, chronic respiratory failure on O2 2 L - Pulm HTN,  NPH s/p VP shunt, diabetes mellitus type 2, HLP, HTN, BPH, CKD stage IIIa, OSA on CPAP, prostate CA, skin CA presented to ED with shortness of breath and hypoxia. Non compliant with oxygen and CPAP  Significant Hospital Events: Including procedures, antibiotic start and stop dates in addition to other pertinent events   ER on 12/6, Admission 12/7  Interim History / Subjective:   On 5 L  with sats of 93%, States he does not wear his oxygen because it " Does not help him" . We had a long discussion about what happens to his heart, and how much harder it has to work when he has drops in his oxygen below 88%. We also discussed what this does to his brain and his mentation. He states he wears his oxygen at night, but in the morning it is usually off.  WBC bumped 12/8 from 7.5 to 10.6, today 10.4, he is afebrile Cough was productive 2 to 3 days ago however currently is nonproductive. He is net Negative 380 cc's Objective   Blood pressure (!) 93/50, pulse 80, temperature 97.8 F (36.6 C), temperature source Oral, resp. rate 18, height 5\' 10"  (1.778 m), weight 86.6 kg, SpO2 92%.        Intake/Output Summary (Last 24 hours) at 04/30/2023 0754 Last data filed at 04/30/2023 0300 Gross per 24 hour  Intake 469.19 ml  Output 700 ml  Net -230.81 ml   Filed Weights   04/27/23 1148  Weight: 86.6 kg    Examination: General:  Awake and alert, elderly male supine in bed HENT:  NCAT, thick neck, No LAD, No JVD Lungs:  Bilateral chest excursion, diminished per bases right side greater than left, rhonchi and crackles Cardiovascular: S1, S2, no rub murmur gallop, no lower extremity  edema Abdomen: Soft, nontender, nondistended, bowel sounds positive, Body mass index is 27.39 kg/m.  Extremities: No obvious deformities, no lower extremity edema ,brisk capillary refill Neuro: Awake and alert ,oriented x 3 ,moving all extremities x 4 ,affect is appropriate GU: Not assessed  CTA Chest was negative for PE Positive for elevated right hemidiaphragm, small right effusion, atelectasis/ scarring per bases. Pulmonary Artery enlargement>> PAH   Chest x-ray 04/30/2023 personally reviewed by me Elevation of the right hemidiaphragm again noted. Bibasilar opacities which may reflect areas of atelectasis and/or consolidation. Substantially improved aeration in the left lung base. No definite  pleural effusions. No pneumothorax. No evidence of pulmonary edema. Heart size is normal. Persistent low lung volumes, bilateral atelectasis and/or consolidation, improving aeration in the left lung base  Respiratory panel negative for influenza A, B and COVID 20 pathogen respiratory panel needs to be collected BNP 53 CRP 4.6 down from 7.6 on 04/29/2023 Procalcitonin less than 0.10 Bedside renal ultrasound was being done this morning Resolved Hospital Problem list     Assessment & Plan:  Acute on chronic hypoxic respiratory failure with hypoxia. Possible etiology includes acute bronchitis, pulmonary hypertension, atelectasis/consolidation, small right pleural effusion on CTA Noncompliant with  home oxygen Noncompliant with  home CPAP CTA with pulmonary artery enlargement suspicious for [facility] Plan Continue Augmentin Obtain respiratory culture if possible Lasix as needed for chest x-ray with edema/ PAH( May need maintenance dosing) Continue scheduled Brovana/ Pulmicort and Duonebs Continue incentive spirometry and flutter valve for pulmonary toilet Out of bed to chair several times daily as tolerated Continue steroids as CRP did decrease after initiated.  Goal of mag > 2  Needs reinforcement of wearing oxygen and maintaining sats > 88%,/ wearing CPAP .  Bevelyn Ngo, MSN, AGACNP-BC [facility] See Amion for personal pager PCCM on call pager [phone] 04/30/2023 9:50 AM      Best Practice (right click and "Reselect all SmartList Selections" daily)   All per Primary Team Labs   CBC: Recent Labs  Lab 04/27/23 1156 04/29/23 0546 04/30/23 0457  WBC 7.5 10.6* 10.4  NEUTROABS  --  9.8* 9.4*  HGB 15.4 14.3 14.9  HCT 49.3 46.5 48.9  MCV 103.6* 103.3* 104.7*  PLT 159 134* 143*    Basic Metabolic Panel: Recent Labs  Lab 04/27/23 1327 04/28/23 0446 04/29/23 0546 04/30/23 0457  NA 148* 148* 147* 141  K 4.5 3.8 4.3 4.5  CL 103 103 105  97*  CO2 37* 35* 31 36*  GLUCOSE 179* 134* 203* 193*  BUN 22 22 39* 53*  CREATININE 1.21 1.32* 1.53* 2.14*  CALCIUM 8.8* 8.7* 8.8* 8.5*  MG  --   --  2.0 2.2   GFR: Estimated Creatinine Clearance: 27 mL/min (A) (by C-G formula based on SCr of 2.14 mg/dL (H)). Recent Labs  Lab 04/27/23 1156 04/29/23 0546 04/30/23 0457  PROCALCITON  --  <0.10 <0.10  WBC 7.5 10.6* 10.4    Liver Function Tests: Recent Labs  Lab 04/27/23 1327  AST 14*  ALT 9  ALKPHOS 54  BILITOT 1.2*  PROT 5.8*  ALBUMIN 3.3*   No results for input(s): "LIPASE", "AMYLASE" in the last 168 hours. No results for input(s): "AMMONIA" in the last 168 hours.  ABG    Component Value Date/Time   PHART 7.37 01/31/2022 0827   PCO2ART 66 (HH) 01/31/2022 0827   PO2ART 75 (L) 01/31/2022 0827   HCO3 38.2 (H) 01/31/2022 0827   TCO2 30 01/27/2022 2000  O2SAT 97.8 01/31/2022 0827     Coagulation Profile: No results for input(s): "INR", "PROTIME" in the last 168 hours.  Cardiac Enzymes: No results for input(s): "CKTOTAL", "CKMB", "CKMBINDEX", "TROPONINI" in the last 168 hours.  HbA1C: Hgb A1c MFr Bld  Date/Time Value Ref Range Status  04/28/2023 04:46 AM 7.7 (H) 4.8 - 5.6 % Final    Comment:    (NOTE) Pre diabetes:          5.7%-6.4%  Diabetes:              >6.4%  Glycemic control for   <7.0% adults with diabetes   01/21/2022 02:00 AM 7.9 (H) 4.8 - 5.6 % Final    Comment:    (NOTE) Pre diabetes:          5.7%-6.4%  Diabetes:              >6.4%  Glycemic control for   <7.0% adults with diabetes     CBG: Recent Labs  Lab 04/28/23 2136 04/29/23 0752 04/29/23 1142 04/29/23 1626 04/29/23 2128  GLUCAP 217* 191* 202* 203* 266*    Review of Systems:    Dyspnea with minimal exertion  Cough  Past Medical History:  He,  has a past medical history of BPH (benign prostatic hyperplasia), Central retinal artery occlusion, Coronary artery disease, Diabetes mellitus without complication (HCC),  Diverticulosis, History of kidney stones, Hyperlipidemia, Hypertension, OSA on CPAP, Osteoarthritis, Prostate cancer (HCC) (01/2014), S/P radiation therapy (04/23/13 - 05/29/14), Seasonal allergies, Skin cancer, Small bowel obstruction (HCC) (06/10/2016), and Wears glasses.   Surgical History:   Past Surgical History:  Procedure Laterality Date   BACK SURGERY  2009   CARDIAC CATHETERIZATION     COLONOSCOPY W/ BIOPSIES AND POLYPECTOMY     CORONARY STENT INTERVENTION N/A 01/24/2022   Procedure: CORONARY STENT INTERVENTION;  Surgeon: Swaziland, [name], MD;  Location: [facility] INVASIVE CV LAB;  Service: Cardiovascular;  Laterality: N/A;   CYSTOSCOPY W/ URETERAL STENT PLACEMENT Left 05/12/2021   Procedure: CYSTOSCOPY WITH RETROGRADE PYELOGRAM/ LEFT URETERAL STENT PLACEMENT.;  Surgeon: Jannifer Hick, MD;  Location: [facility] OR;  Service: Urology;  Laterality: Left;   HYDROCELE EXCISION  1963   LEFT HEART CATH AND CORONARY ANGIOGRAPHY N/A 03/14/2019   Procedure: LEFT HEART CATH AND CORONARY ANGIOGRAPHY;  Surgeon: Kathleene Hazel, MD;  Location: [facility] INVASIVE CV LAB;  Service: Cardiovascular;  Laterality: N/A;   LEFT HEART CATH AND CORONARY ANGIOGRAPHY N/A 01/24/2022   Procedure: LEFT HEART CATH AND CORONARY ANGIOGRAPHY;  Surgeon: Swazil[place], [name], MD;  Location: [facility] INVASIVE CV LAB;  Service: Cardiovascular;  Laterality: N/A;   PROSTATE BIOPSY  2013, 2014, 01/2014   Gleason 7   RADIOACTIVE SEED IMPLANT N/A 07/01/2014   Procedure: RADIOACTIVE SEED IMPLANT;  Surgeon: Valetta Fuller, MD;  Location: [facility];  Service: Urology;  Laterality: N/A;  DR PORTABLE   TONSILLECTOMY     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   Procedure: SHUNT INSERTION VENTRICULAR-PERITONEAL;  Surgeon: Tia Alert, MD;  Location: [facility] OR;  Service: Neurosurgery;  Laterality: N/A;  SHUNT INSERTION VENTRICULAR-PERITONEAL     Social History:   reports that he quit smoking about 62 years ago. His smoking use included  cigarettes. He has never used smokeless tobacco. He reports that he does not currently use alcohol after a past usage of about 1.0 standard drink of alcohol per week. He reports that he does not use drugs.   Family History:  His family history includes  Cancer in his father and paternal uncle; Dementia (age of onset: 38) in his mother; Diabetes in his father; Heart attack (age of onset: 75) in his father; Lung cancer in his brother.   Allergies Allergies  Allergen Reactions   Cefepime Other (See Comments)    toxic encephalopathy   Lipitor [Atorvastatin] Other (See Comments)    Hip pain     Home Medications  Prior to Admission medications   Medication Sig Start Date End Date Taking? Authorizing Provider  aspirin EC 81 MG tablet Take 81 mg by mouth daily.    Yes [provider]  carvedilol (COREG) 12.5 MG tablet Take 1 tablet (12.5 mg total) by mouth 2 (two) times daily. 01/25/22  Yes Almon Hercules, MD  cetirizine (ZYRTEC) 10 MG tablet Take 10 mg by mouth daily.   Yes [provider]  cholecalciferol (VITAMIN D3) 25 MCG (1000 UNIT) tablet Take 2,000 Units by mouth daily.   Yes [provider]  clopidogrel (PLAVIX) 75 MG tablet Take 1 tablet (75 mg total) by mouth daily with breakfast. 02/13/22  Yes Azalee Course, PA  ezetimibe (ZETIA) 10 MG tablet Take 10 mg by mouth daily. 05/02/21  Yes [provider]  metFORMIN (GLUCOPHAGE-XR) 500 MG 24 hr tablet Take 500 mg by mouth daily. 01/11/22  Yes [provider]  Multiple Vitamins-Minerals (PRESERVISION AREDS) CAPS Take 2 capsules by mouth daily.   Yes [provider]  pantoprazole (PROTONIX) 20 MG tablet Take 20 mg by mouth daily before breakfast.  02/19/19  Yes [provider]  rosuvastatin (CRESTOR) 10 MG tablet Take 1 tablet (10 mg total) by mouth daily. 01/25/22 04/28/23 Yes Almon Hercules, MD  vitamin B-12 (CYANOCOBALAMIN) 500 MCG tablet Take 500-1,000 mcg by mouth daily. Alternate and  by mouth every other day.   Yes [provider]     Pulmonary APP Time 45 minutes    Bevelyn Ngo, MSN, AGACNP-BC [facility] Pulmonary/Critical Care Medicine See Amion for personal pager PCCM on call pager [phone]

## 2023-05-01 ENCOUNTER — Encounter (HOSPITAL_COMMUNITY): Payer: Self-pay | Admitting: Cardiology

## 2023-05-01 ENCOUNTER — Encounter (HOSPITAL_COMMUNITY)
Admission: EM | Disposition: A | Discharge status: Home Health | Payer: Self-pay | Source: Home / Self Care | Attending: Internal Medicine

## 2023-05-01 DIAGNOSIS — I272 Pulmonary hypertension, unspecified: Secondary | ICD-10-CM | POA: Diagnosis not present

## 2023-05-01 DIAGNOSIS — J9811 Atelectasis: Secondary | ICD-10-CM | POA: Diagnosis not present

## 2023-05-01 DIAGNOSIS — G4733 Obstructive sleep apnea (adult) (pediatric): Secondary | ICD-10-CM

## 2023-05-01 DIAGNOSIS — J9622 Acute and chronic respiratory failure with hypercapnia: Secondary | ICD-10-CM

## 2023-05-01 DIAGNOSIS — J9621 Acute and chronic respiratory failure with hypoxia: Secondary | ICD-10-CM | POA: Diagnosis not present

## 2023-05-01 HISTORY — PX: RIGHT HEART CATH: CATH118263

## 2023-05-01 LAB — CBC WITH DIFFERENTIAL/PLATELET
Abs Immature Granulocytes: 0.05 10*3/uL (ref 0.00–0.07)
Basophils Absolute: 0 10*3/uL (ref 0.0–0.1)
Basophils Relative: 0 %
Eosinophils Absolute: 0 10*3/uL (ref 0.0–0.5)
Eosinophils Relative: 0 %
HCT: 47.8 % (ref 39.0–52.0)
Hemoglobin: 14.7 g/dL (ref 13.0–17.0)
Immature Granulocytes: 1 %
Lymphocytes Relative: 3 %
Lymphs Abs: 0.3 10*3/uL — ABNORMAL LOW (ref 0.7–4.0)
MCH: 31.3 pg (ref 26.0–34.0)
MCHC: 30.8 g/dL (ref 30.0–36.0)
MCV: 101.9 fL — ABNORMAL HIGH (ref 80.0–100.0)
Monocytes Absolute: 0.1 10*3/uL (ref 0.1–1.0)
Monocytes Relative: 2 %
Neutro Abs: 7.1 10*3/uL (ref 1.7–7.7)
Neutrophils Relative %: 94 %
Platelets: 145 10*3/uL — ABNORMAL LOW (ref 150–400)
RBC: 4.69 MIL/uL (ref 4.22–5.81)
RDW: 14.3 % (ref 11.5–15.5)
WBC: 7.5 10*3/uL (ref 4.0–10.5)
nRBC: 0 % (ref 0.0–0.2)

## 2023-05-01 LAB — POCT I-STAT EG7
Acid-Base Excess: 10 mmol/L — ABNORMAL HIGH (ref 0.0–2.0)
Acid-Base Excess: 9 mmol/L — ABNORMAL HIGH (ref 0.0–2.0)
Bicarbonate: 38.5 mmol/L — ABNORMAL HIGH (ref 20.0–28.0)
Bicarbonate: 40.3 mmol/L — ABNORMAL HIGH (ref 20.0–28.0)
Calcium, Ion: 1.18 mmol/L (ref 1.15–1.40)
Calcium, Ion: 1.2 mmol/L (ref 1.15–1.40)
HCT: 45 % (ref 39.0–52.0)
HCT: 46 % (ref 39.0–52.0)
Hemoglobin: 15.3 g/dL (ref 13.0–17.0)
Hemoglobin: 15.6 g/dL (ref 13.0–17.0)
O2 Saturation: 69 %
O2 Saturation: 69 %
Potassium: 4.3 mmol/L (ref 3.5–5.1)
Potassium: 4.5 mmol/L (ref 3.5–5.1)
Sodium: 140 mmol/L (ref 135–145)
Sodium: 142 mmol/L (ref 135–145)
TCO2: 41 mmol/L — ABNORMAL HIGH (ref 22–32)
TCO2: 43 mmol/L — ABNORMAL HIGH (ref 22–32)
pCO2, Ven: 78.1 mm[Hg] (ref 44–60)
pCO2, Ven: 78.9 mm[Hg] (ref 44–60)
pH, Ven: 7.301 (ref 7.25–7.43)
pH, Ven: 7.316 (ref 7.25–7.43)
pO2, Ven: 41 mm[Hg] (ref 32–45)
pO2, Ven: 42 mm[Hg] (ref 32–45)

## 2023-05-01 LAB — GLUCOSE, CAPILLARY
Glucose-Capillary: 201 mg/dL — ABNORMAL HIGH (ref 70–99)
Glucose-Capillary: 199 mg/dL — ABNORMAL HIGH (ref 70–99)
Glucose-Capillary: 191 mg/dL — ABNORMAL HIGH (ref 70–99)
Glucose-Capillary: 298 mg/dL — ABNORMAL HIGH (ref 70–99)

## 2023-05-01 LAB — CREATININE, URINE, RANDOM: Creatinine, Urine: 81 mg/dL

## 2023-05-01 LAB — BASIC METABOLIC PANEL
Anion gap: 6 (ref 5–15)
BUN: 46 mg/dL — ABNORMAL HIGH (ref 8–23)
CO2: 36 mmol/L — ABNORMAL HIGH (ref 22–32)
Calcium: 8.7 mg/dL — ABNORMAL LOW (ref 8.9–10.3)
Chloride: 99 mmol/L (ref 98–111)
Creatinine, Ser: 1.46 mg/dL — ABNORMAL HIGH (ref 0.61–1.24)
GFR, Estimated: 47 mL/min — ABNORMAL LOW (ref >60)
Glucose, Bld: 207 mg/dL — ABNORMAL HIGH (ref 70–99)
Potassium: 4.9 mmol/L (ref 3.5–5.1)
Sodium: 141 mmol/L (ref 135–145)

## 2023-05-01 LAB — PROCALCITONIN: Procalcitonin: <0.1 ng/mL

## 2023-05-01 LAB — C-REACTIVE PROTEIN: CRP: 2.3 mg/dL — ABNORMAL HIGH (ref <1.0)

## 2023-05-01 LAB — SODIUM, URINE, RANDOM: Sodium, Ur: 34 mmol/L

## 2023-05-01 LAB — OSMOLALITY, URINE: Osmolality, Ur: 635 mosm/kg (ref 300–900)

## 2023-05-01 LAB — MAGNESIUM: Magnesium: 2.1 mg/dL (ref 1.7–2.4)

## 2023-05-01 LAB — BRAIN NATRIURETIC PEPTIDE: B Natriuretic Peptide: 290.8 pg/mL — ABNORMAL HIGH (ref 0.0–100.0)

## 2023-05-01 SURGERY — RIGHT HEART CATH

## 2023-05-01 MED ORDER — HEPARIN (PORCINE) IN NACL 1000-0.9 UT/500ML-% IV SOLN
INTRAVENOUS | Status: DC | PRN
  Administered 2023-05-01: 500 mL

## 2023-05-01 MED ORDER — LIDOCAINE HCL (PF) 1 % IJ SOLN
INTRAMUSCULAR | Status: DC | PRN
  Administered 2023-05-01: 5 mL

## 2023-05-01 MED ORDER — ENOXAPARIN SODIUM 40 MG/0.4ML IJ SOSY
40.0000 mg | PREFILLED_SYRINGE | INTRAMUSCULAR | Status: DC
  Administered 2023-05-02 – 2023-05-03 (×2): 40 mg via SUBCUTANEOUS
  Filled 2023-05-01 (×2): qty 0.4

## 2023-05-01 MED ORDER — LIDOCAINE HCL (PF) 1 % IJ SOLN
INTRAMUSCULAR | Status: AC
  Filled 2023-05-01: qty 30

## 2023-05-01 SURGICAL SUPPLY — 6 items
CATH BALLN WEDGE 5F 110CM (CATHETERS) IMPLANT
GUIDEWIRE .025 260CM (WIRE) IMPLANT
PACK CARDIAC CATHETERIZATION (CUSTOM PROCEDURE TRAY) IMPLANT
SHEATH GLIDE SLENDER 4/5FR (SHEATH) IMPLANT
TRANSDUCER W/STOPCOCK (MISCELLANEOUS) IMPLANT
TUBING ART PRESS 72 MALE/FEM (TUBING) IMPLANT

## 2023-05-01 NOTE — Plan of Care (Signed)

## 2023-05-01 NOTE — Consult Note (Signed)
Cardiology Consultation   Patient ID: Jeremiah [name] MRN: [id number]; DOB: [birth year]  Admit date: 04/27/2023 Date of Consult: 05/01/2023  PCP:  Jeremiah Ran, MD   [facility] Providers Cardiologist:  Peter Swaziland, MD        Patient Profile:   Jeremiah [name] is a 82 y.o. male with a hx of CAD status post PCI, chronic respiratory failure on O2 2 L - Pulm HTN, NPH s/p VP shunt, diabetes mellitus type 2, HLP, HTN, BPH, CKD stage IIIa, OSA on CPAP with poor compliance, prostate CA, skin CA, was admitted 12/06 with SOB and hypoxia.    No PE on CT, elevated R hemidiaphragm, right pleural effusion, atelectasis and scar.  Pulmonary artery enlargement noted, suggesting PAH.  No acute cause for his dyspnea was found.  CCM was called and he was seen by Dr. Chestine Spore.  He was still requiring up to 8 L HHFNC.  Dr. Chestine Spore had significant concern for chronic pulmonary hypertension, likely WHO group 2 and 3.  He had mildly reduced RV function on his echo.  She was concerned about shunt physiology through his right lower lobe.  She was also worried about pulmonary edema because BNP was elevated.  Cardiology was asked to evaluate him on 05/01/2023 for pulmonary hypertension by Dr. Thedore Mins and Dr. Chestine Spore   History of Present Illness:   [name] says that in the not too distant past, he would use his pulse ox machine and be able to get his sats into the mid 90s by taking deep breaths.  He also notes that recently when he tried to do things around the house and yard, he would become exhausted.  He was trying to do these things without oxygen which he says he rarely wore.  He cannot really say much about how long ago his symptoms started worsening.  During these periods of exhaustion, he was not checking his oxygen levels.  However, his main symptom of hypoxia is fatigue and weakness.   Since being in the hospital and on higher levels of O2, he is feeling much better.    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   S/P radiation therapy 04/23/13 - 05/29/14   Prostate/seminal vesicles, external beam 4500 cGy in 25 sessions   Seasonal allergies    Skin cancer    scalp   Small bowel obstruction (HCC) 06/10/2016   Wears glasses     Past Surgical History:  Procedure Laterality Date   BACK SURGERY  2009   CARDIAC CATHETERIZATION     COLONOSCOPY W/ BIOPSIES AND POLYPECTOMY     CORONARY STENT INTERVENTION N/A 01/24/2022   Procedure: CORONARY STENT INTERVENTION;  Surgeon: Swaziland, [name], MD;  Location: [facility] INVASIVE CV LAB;  Service: Cardiovascular;  Laterality: N/A;   CYSTOSCOPY W/ URETERAL STENT PLACEMENT Left 05/12/2021   Procedure: CYSTOSCOPY WITH RETROGRADE PYELOGRAM/ LEFT URETERAL STENT PLACEMENT.;  Surgeon: Jannifer Hick, MD;  Location: [facility] OR;  Service: Urology;  Laterality: Left;   HYDROCELE EXCISION  1963   LEFT  HEART CATH AND CORONARY ANGIOGRAPHY N/A 03/14/2019   Procedure: LEFT HEART CATH AND CORONARY ANGIOGRAPHY;  Surgeon: Kathleene Hazel, MD;  Location: [facility] INVASIVE CV LAB;  Service: Cardiovascular;  Laterality: N/A;   LEFT HEART CATH AND CORONARY ANGIOGRAPHY N/A 01/24/2022   Procedure: LEFT HEART CATH AND CORONARY ANGIOGRAPHY;  Surgeon: Swaziland, [name], MD;  Location: [facility] INVASIVE CV LAB;  Service: Cardiovascular;  Laterality: N/A;   PROSTATE BIOPSY  2013, 2014, 01/2014   Gleason 7   RADIOACTIVE SEED IMPLANT N/A 07/01/2014   Procedure: RADIOACTIVE SEED IMPLANT;  Surgeon: Valetta Fuller, MD;  Location: [facility];  Service: Urology;  Laterality: N/A;  DR PORTABLE   TONSILLECTOMY      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   Procedure: SHUNT INSERTION VENTRICULAR-PERITONEAL;  Surgeon: Tia Alert, MD;  Location: [facility] OR;  Service: Neurosurgery;  Laterality: N/A;  SHUNT INSERTION VENTRICULAR-PERITONEAL     Home Medications:  Prior to Admission medications   Medication Sig Start Date End Date Taking? Authorizing Provider  aspirin EC 81 MG tablet Take 81 mg by mouth daily.    Yes [provider]  carvedilol (COREG) 12.5 MG tablet Take 1 tablet (12.5 mg total) by mouth 2 (two) times daily. 01/25/22  Yes Almon Hercules, MD  cetirizine (ZYRTEC) 10 MG tablet Take 10 mg by mouth daily.   Yes [provider]  cholecalciferol (VITAMIN D3) 25 MCG (1000 UNIT) tablet Take 2,000 Units by mouth daily.   Yes [provider]  clopidogrel (PLAVIX) 75 MG tablet Take 1 tablet (75 mg total) by mouth daily with breakfast. 02/13/22  Yes Azalee Course, PA  ezetimibe (ZETIA) 10 MG tablet Take 10 mg by mouth daily. 05/02/21  Yes [provider]  metFORMIN (GLUCOPHAGE-XR) 500 MG 24 hr tablet Take 500 mg by mouth daily. 01/11/22  Yes [provider]  Multiple Vitamins-Minerals (PRESERVISION AREDS) CAPS Take 2 capsules by mouth daily.   Yes [provider]  pantoprazole (PROTONIX) 20 MG tablet Take 20 mg by mouth daily before breakfast.  02/19/19  Yes [provider]  rosuvastatin (CRESTOR) 10 MG tablet Take 1 tablet (10 mg total) by mouth daily. 01/25/22 04/28/23 Yes Almon Hercules, MD  vitamin B-12 (CYANOCOBALAMIN) 500 MCG tablet Take 500-1,000 mcg by mouth daily. Alternate and by mouth every other day.   Yes [provider]    Inpatient Medications: Scheduled Meds:  amoxicillin-clavulanate  1 tablet Oral BID   arformoterol  15 mcg Nebulization BID   aspirin EC  81 mg Oral Daily   budesonide (PULMICORT) nebulizer solution  0.25 mg Nebulization BID   carvedilol  3.125 mg Oral BID   cholecalciferol  2,000 Units Oral Daily    clopidogrel  75 mg Oral Q breakfast   vitamin B-12  500 mcg Oral QODAY   And   vitamin B-12  1,000 mcg Oral QODAY   enoxaparin (LOVENOX) injection  30 mg Subcutaneous Q24H   ezetimibe  10 mg Oral Daily   insulin aspart  0-5 Units Subcutaneous QHS   insulin aspart  0-9 Units Subcutaneous TID WC   insulin glargine-yfgn  12 Units Subcutaneous Daily   ipratropium-albuterol  3 mL Nebulization BID   methylPREDNISolone (SOLU-MEDROL) injection  30 mg Intravenous Q24H   pantoprazole  40 mg Oral Q0600   pneumococcal 20-valent conjugate vaccine  0.5 mL Intramuscular Tomorrow-1000   rosuvastatin  10 mg Oral Daily   Continuous Infusions:  PRN Meds: acetaminophen **OR** acetaminophen  Allergies:    Allergies  Allergen Reactions   Cefepime Other (See Comments)    toxic encephalopathy   Lipitor [Atorvastatin] Other (See Comments)    Hip pain    Social History:   Social History   Socioeconomic History   Marital status: Married    Spouse name: Not on file   Number of children: 2   Years of education: 13   Highest education level: Some college, no degree  Occupational History   Occupation: retired    Comment: all Equities trader  Tobacco Use   Smoking status: Former    Current packs/day: 0.00    Types: Cigarettes    Quit date: 03/10/1961    Years since quitting: 62.1   Smokeless tobacco: Never   Tobacco comments:    Pt states he quit smoking 60 plus years ago and he remembers smoking maybe a pack a day. ALS 04/20/22  Vaping Use   Vaping status: Never Used  Substance and Sexual Activity   Alcohol use: Not Currently    Alcohol/week: 1.0 standard drink of alcohol    Types: 1 Glasses of wine per week    Comment: rare alcohol   Drug use: No   Sexual activity: Not on file  Other Topics Concern   Not on file  Social History Narrative   Rare caffeine use    Social Determinants of Health   Financial Resource Strain: Not on file  Food Insecurity: No Food Insecurity  (04/28/2023)   Hunger Vital Sign    Worried About Running Out of Food in the Last Year: Never true    [name] Out of Food in the Last Year: Never true  Transportation Needs: No Transportation Needs (04/28/2023)   PRAPARE - Administrator, Civil Service (Medical): No    Lack of Transportation (Non-Medical): No  Physical Activity: Unknown (06/13/2018)   Exercise Vital Sign    Days of Exercise per Week: Patient declined    Minutes of Exercise per Session: Patient declined  Stress: No Stress Concern Present (06/13/2018)   [employer] of Occupational Health - Occupational Stress Questionnaire    Feeling of Stress : Only a little  Social Connections: Unknown (06/13/2018)   Social Connection and Isolation Panel [NHANES]    Frequency of Communication with Friends and Family: More than three times a week    Frequency of Social Gatherings with Friends and Family: More than three times a week    Attends Religious Services: Not on file    Active Member of Clubs or Organizations: Not on file    Attends Banker Meetings: Not on file    Marital Status: Not on file  Intimate Partner Violence: Not At Risk (04/28/2023)   Humiliation, Afraid, Rape, and Kick questionnaire    Fear of Current or Ex-Partner: No    Emotionally Abused: No    Physically Abused: No    Sexually Abused: No    Family History:   Family History  Problem Relation Age of Onset   Heart attack Father 64   Cancer Father        prostate   Diabetes Father    Cancer Paternal Uncle        prostate   Dementia Mother 33   Lung cancer Brother      ROS:  Please see the history of present illness.  All other ROS reviewed and negative.     Physical Exam/Data:   Vitals:   05/01/23 0500 05/01/23 0800 05/01/23  8657 05/01/23 0950  BP: 118/70 (!) 141/66    Pulse:  (!) 59  83  Resp:  (!) 24    Temp: 98.2 F (36.8 C) 97.6 F (36.4 C)    TempSrc: Oral Axillary    SpO2:  93% 92%   Weight:      Height:         Intake/Output Summary (Last 24 hours) at 05/01/2023 1112 Last data filed at 05/01/2023 8469 Gross per 24 hour  Intake 131.21 ml  Output 200 ml  Net -68.79 ml      04/27/2023   11:48 AM 01/17/2023    9:26 AM 07/19/2022    8:28 AM  Last 3 Weights  Weight (lbs) 190 lb 14.7 oz 191 lb 188 lb  Weight (kg) 86.6 kg 86.637 kg 85.276 kg     Body mass index is 27.39 kg/m.  General:  Well nourished, well developed, in no acute distress on O2 HEENT: normal Neck: no JVD seen, difficult to assess secondary to body habitus Vascular: No carotid bruits; Distal pulses 2+ bilaterally Cardiac:  normal S1, S2; RRR; no murmur  Lungs: Rales and some rhonchi bilaterally, no wheezing Abd: soft, nontender, no hepatomegaly  Ext: no edema Musculoskeletal:  No deformities, BUE and BLE strength normal and equal Skin: warm and dry  Neuro:  CNs 2-12 intact, no focal abnormalities noted Psych:  Normal affect   EKG:  The EKG was personally reviewed and demonstrates: 12/06  ECG is sinus rhythm, heart rate 78, right bundle branch block is old Telemetry:  Telemetry was personally reviewed and demonstrates: Sinus rhythm  Relevant CV Studies:  ECHO: 04/28/2023  1. Left ventricular ejection fraction, by estimation, is 60 to 65%. The left ventricle has normal function. The left ventricle has no regional wall motion abnormalities. There is moderate LVH. Left ventricular diastolic parameters are consistent with Grade I diastolic dysfunction (impaired relaxation).   2. Right ventricular systolic function is mildly reduced. The right  ventricular size is normal.   3. The mitral valve is normal in structure. No evidence of mitral valve regurgitation. No evidence of mitral stenosis.   4. The aortic valve is tricuspid. There is mild calcification of the  aortic valve. There is mild thickening of the aortic valve. Aortic valve regurgitation is trivial. Aortic valve sclerosis is present, with no evidence of aortic valve  stenosis.   5. Aortic dilatation noted. There is mild dilatation of the ascending aorta, measuring 41 mm.   6. The inferior vena cava is normal in size with greater than 50% respiratory variability, suggesting right atrial pressure of 3 mmHg.   CARDIAC CATH: 01/24/2022   Prox LAD lesion is 50% stenosed.   1st Diag lesion is 50% stenosed.   Prox RCA to Mid RCA lesion is 15% stenosed.   RPDA lesion is 90% stenosed.   A drug-eluting stent was successfully placed using a STENT ONYX FRONTIER 2.5X12.   Post intervention, there is a 0% residual stenosis.   LV end diastolic pressure is normal.   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 Anticipate DC in am Diagnostic Dominance: Right  Intervention    Laboratory Data:  High Sensitivity Troponin:   Recent Labs  Lab 04/27/23 1210 04/27/23 1327  TROPONINIHS 7 9     Chemistry Recent Labs  Lab 04/29/23 0546 04/30/23 0457 05/01/23 0435  NA 147* 141 141  K 4.3 4.5 4.9  CL 105 97* 99  CO2 31 36* 36*  GLUCOSE 203* 193* 207*  BUN 39* 53* 46*  CREATININE 1.53* 2.14* 1.46*  CALCIUM 8.8* 8.5* 8.7*  MG 2.0 2.2 2.1  GFRNONAA 45* 30* 47*  ANIONGAP 11 8 6     Recent Labs  Lab 04/27/23 1327  PROT 5.8*  ALBUMIN 3.3*  AST 14*  ALT 9  ALKPHOS 54  BILITOT 1.2*   Lipids No results for input(s): "CHOL", "TRIG", "HDL", "LABVLDL", "LDLCALC", "CHOLHDL" in the last 168 hours.  Hematology Recent Labs  Lab 04/29/23 0546 05/28/2023 0457 05/01/23 0435  WBC 10.6* 10.4 7.5  RBC 4.50 4.67 4.69  HGB 14.3 14.9 14.7  HCT 46.5 48.9 47.8  MCV 103.3* 104.7* 101.9*  MCH 31.8 31.9 31.3  MCHC 30.8 30.5 30.8  RDW 14.6 14.4 14.3  PLT 134* 143* 145*   BNP Recent Labs  Lab 04/29/23 0546 2023/05/28 0457 05/01/23 0435  BNP 114.8* 53.1 290.8*      Radiology/Studies:  US RENAL  Result Date: May 28, 2023 CLINICAL DATA:  Acute kidney injury EXAM: RENAL  / URINARY TRACT ULTRASOUND COMPLETE COMPARISON:  CT abdomen pelvis 05/27/2021 FINDINGS: Right Kidney: Renal measurements: 9.0 x 6.0 x 3.8 cm = volume: 90 mL. Visualization of the right kidney is limited due to adjacent shadowing bowel gas. Numerous right renal calculi seen on prior CT are not definitively identified on the current exam, however this is likely due to limited visualization rather than resolution. Left Kidney: Renal measurements: 9.0 x 4.1 x 4.5 cm = volume: 86 mL. Echogenicity within normal limits. No mass or hydronephrosis visualized. Bladder: Appears normal for degree of bladder distention. Other: None. IMPRESSION: 1. No hydronephrosis. 2. Limited visualization of the right kidney. No significant abnormality of the left kidney. Electronically Signed   By: Acquanetta Belling M.D.   On: May 28, 2023 10:17   DG Chest Port 1 View  Result Date: 05-28-2023 CLINICAL DATA:  82 year old male with history of shortness of breath. EXAM: PORTABLE CHEST 1 VIEW COMPARISON:  Chest x-ray 04/29/2023. FINDINGS: Elevation of the right hemidiaphragm again noted. Bibasilar opacities which may reflect areas of atelectasis and/or consolidation. Substantially improved aeration in the left lung base. No definite pleural effusions. No pneumothorax. No evidence of pulmonary edema. Heart size is normal. The patient is rotated to the right on today's exam, resulting in distortion of the mediastinal contours and reduced diagnostic sensitivity and specificity for mediastinal pathology. Atherosclerotic calcifications are noted in the thoracic aorta. IMPRESSION: 1. Persistent low lung volumes with bibasilar areas of atelectasis and/or consolidation, but with improving aeration in the left lung base, indicative of resolving airspace consolidation. 2. Aortic atherosclerosis. Electronically Signed   By: Trudie Reed M.D.   On: 05/28/2023 07:33   DG Chest Port 1 View  Result Date: 04/29/2023 CLINICAL DATA:  Shortness of breath EXAM:  PORTABLE CHEST 1 VIEW COMPARISON:  04/27/2023 FINDINGS: Asymmetric elevation right hemidiaphragm. The cardio pericardial silhouette is enlarged. There is pulmonary vascular congestion without overt pulmonary edema. Left base collapse/consolidation with small to moderate left pleural effusion. IMPRESSION: 1. Interval progression of left base collapse/consolidation with small to moderate left pleural effusion. 2. Pulmonary vascular congestion without overt pulmonary edema. Electronically Signed   By: Kennith Center M.D.   On: 04/29/2023 08:50   ECHOCARDIOGRAM COMPLETE  Result Date: 04/28/2023    ECHOCARDIOGRAM REPORT   Patient Name:   ELAINE BASORE Date of Exam: 04/28/2023 Medical Rec #:  [id number]  Height:       70.0 in Accession #:    [id number]      Weight:       190.9 lb Date of Birth:  11/13/1940       BSA:          2.047 m Patient Age:    82 years        BP:           131/71 mmHg Patient Gender: M               HR:           79 bpm. Exam Location:  Inpatient Procedure: 2D Echo, Color Doppler, Cardiac Doppler and Intracardiac            Opacification Agent Indications:    Pulmonary Hypertension  History:        Patient has prior history of Echocardiogram examinations, most                 recent 01/21/2022. NSTEMI; Risk Factors:Sleep Apnea, Dyslipidemia,                 Hypertension and Diabetes.  Sonographer:    [facility] Referring Phys: [id number] VASUNDHRA RATHORE IMPRESSIONS  1. Left ventricular ejection fraction, by estimation, is 60 to 65%. The left ventricle has normal function. The left ventricle has no regional wall motion abnormalities. There is moderate left ventricular hypertrophy. Left ventricular diastolic parameters are consistent with Grade I diastolic dysfunction (impaired relaxation).  2. Right ventricular systolic function is mildly reduced. The right ventricular size is normal.  3. The mitral valve is normal in structure. No evidence of mitral valve regurgitation. No evidence of mitral stenosis.   4. The aortic valve is tricuspid. There is mild calcification of the aortic valve. There is mild thickening of the aortic valve. Aortic valve regurgitation is trivial. Aortic valve sclerosis is present, with no evidence of aortic valve stenosis.  5. Aortic dilatation noted. There is mild dilatation of the ascending aorta, measuring 41 mm.  6. The inferior vena cava is normal in size with greater than 50% respiratory variability, suggesting right atrial pressure of 3 mmHg. FINDINGS  Left Ventricle: Left ventricular ejection fraction, by estimation, is 60 to 65%. The left ventricle has normal function. The left ventricle has no regional wall motion abnormalities. Definity contrast agent was given IV to delineate the left ventricular  endocardial borders. The left ventricular internal cavity size was normal in size. There is moderate left ventricular hypertrophy. Left ventricular diastolic parameters are consistent with Grade I diastolic dysfunction (impaired relaxation). Right Ventricle: The right ventricular size is normal. No increase in right ventricular wall thickness. Right ventricular systolic function is mildly reduced. Left Atrium: Left atrial size was normal in size. Right Atrium: Right atrial size was normal in size. Pericardium: There is no evidence of pericardial effusion. Mitral Valve: The mitral valve is normal in structure. No evidence of mitral valve regurgitation. No evidence of mitral valve stenosis. Tricuspid Valve: The tricuspid valve is normal in structure. Tricuspid valve regurgitation is not demonstrated. No evidence of tricuspid stenosis. Aortic Valve: The aortic valve is tricuspid. There is mild calcification of the aortic valve. There is mild thickening of the aortic valve. Aortic valve regurgitation is trivial. Aortic valve sclerosis is present, with no evidence of aortic valve stenosis. Aortic valve mean gradient measures 5.0 mmHg. Aortic valve peak gradient measures 9.9 mmHg. Aortic valve  area, by VTI measures 2.86 cm. Pulmonic Valve: The  pulmonic valve was normal in structure. Pulmonic valve regurgitation is not visualized. No evidence of pulmonic stenosis. Aorta: Aortic dilatation noted. There is mild dilatation of the ascending aorta, measuring 41 mm. Venous: The inferior vena cava is normal in size with greater than 50% respiratory variability, suggesting right atrial pressure of 3 mmHg. IAS/Shunts: The interatrial septum was not well visualized.  LEFT VENTRICLE PLAX 2D LVIDd:         4.40 cm   Diastology LVIDs:         3.00 cm   LV e' medial:    6.96 cm/s LV PW:         1.20 cm   LV E/e' medial:  10.6 LV IVS:        1.40 cm   LV e' lateral:   11.10 cm/s LVOT diam:     2.10 cm   LV E/e' lateral: 6.7 LV SV:         91 LV SV Index:   45 LVOT Area:     3.46 cm  RIGHT VENTRICLE RV Basal diam:  3.10 cm RV Mid diam:    3.40 cm RV S prime:     14.80 cm/s TAPSE (M-mode): 1.9 cm LEFT ATRIUM             Index        RIGHT ATRIUM           Index LA diam:        3.80 cm 1.86 cm/m   RA Area:     18.80 cm LA Vol (A2C):   49.3 ml 24.09 ml/m  RA Volume:   51.80 ml  25.31 ml/m LA Vol (A4C):   36.9 ml 18.03 ml/m LA Biplane Vol: 44.1 ml 21.55 ml/m  AORTIC VALVE AV Area (Vmax):    2.85 cm AV Area (Vmean):   2.91 cm AV Area (VTI):     2.86 cm AV Vmax:           157.00 cm/s AV Vmean:          101.000 cm/s AV VTI:            0.320 m AV Peak Grad:      9.9 mmHg AV Mean Grad:      5.0 mmHg LVOT Vmax:         129.00 cm/s LVOT Vmean:        84.900 cm/s LVOT VTI:          0.264 m LVOT/AV VTI ratio: 0.82  AORTA Ao Root diam: 4.10 cm Ao Asc diam:  4.10 cm MITRAL VALVE               TRICUSPID VALVE MV Area (PHT): 3.42 cm    TR Peak grad:   34.6 mmHg MV Decel Time: 222 msec    TR Vmax:        294.00 cm/s MV E velocity: 73.90 cm/s MV A velocity: 96.40 cm/s  SHUNTS MV E/A ratio:  0.77        Systemic VTI:  0.26 m                            Systemic Diam: 2.10 cm Charlton Haws MD Electronically signed by Charlton Haws MD  Signature Date/Time: 04/28/2023/11:38:38 AM    Final    CT Angio Chest PE W and/or Wo Contrast  Result Date: 04/27/2023 CLINICAL DATA:  Shortness of breath and weakness EXAM: CT  ANGIOGRAPHY CHEST WITH CONTRAST TECHNIQUE: Multidetector CT imaging of the chest was performed using the standard protocol during bolus administration of intravenous contrast. Multiplanar CT image reconstructions and MIPs were obtained to evaluate the vascular anatomy. RADIATION DOSE REDUCTION: This exam was performed according to the departmental dose-optimization program which includes automated exposure control, adjustment of the mA and/or kV according to patient size and/or use of iterative reconstruction technique. CONTRAST:  75mL OMNIPAQUE IOHEXOL 350 MG/ML SOLN COMPARISON:  Plain film of earlier today.  CTA chest 01/27/2022. FINDINGS: Cardiovascular: The quality of this exam for evaluation of pulmonary embolism is moderate. The bolus is centered in the [facility] to the and there is minimal motion degradation. No evidence of pulmonary embolism. Aortic atherosclerosis. Mild ascending aortic dilatation including at 4.2 cm, similar. Tortuous descending thoracic aorta. Mild cardiomegaly, without pericardial effusion. Lad and right coronary artery calcification. Pulmonary artery enlargement, outflow tract 3.7 cm. Mediastinum/Nodes: No mediastinal or hilar adenopathy. Lungs/Pleura: Small right pleural effusion, minimally increased. Moderate right hemidiaphragm elevation. VP shunt catheter again identified about the right chest wall, terminating in the right pleural space inferiorly. Right greater than left base volume loss and airspace disease, all favored to represent atelectasis and scar. Upper Abdomen: small dependent gallstones. Normal imaged portions of the liver, spleen, stomach, pancreas, adrenal glands. Musculoskeletal: No acute osseous abnormality. Review of the MIP images confirms the above findings. IMPRESSION: 1. No pulmonary  embolism with above limitations. 2. Moderate to marked right hemidiaphragm elevation with persistent small right pleural effusion and similar bibasilar volume loss with atelectasis and scar. 3. Similar mild ascending aortic dilatation at 4.2 cm. Recommend annual imaging followup by CTA or MRA. This recommendation follows 2010 ACCF/AHA/AATS/ACR/ASA/SCA/SCAI/SIR/STS/SVM Guidelines for the Diagnosis and Management of Patients with Thoracic Aortic Disease. Circulation. 2010; 121: K440-N027. Aortic aneurysm NOS (ICD10-I71.9) 4. Pulmonary artery enlargement suggests pulmonary arterial hypertension. 5. Cholelithiasis 6. Aortic Atherosclerosis (ICD10-I70.0). Coronary artery atherosclerosis. Electronically Signed   By: Jeronimo Greaves M.D.   On: 04/27/2023 17:34   DG Chest Port 1 View  Result Date: 04/27/2023 CLINICAL DATA:  Shortness of breath.  Fatigue. EXAM: PORTABLE CHEST 1 VIEW COMPARISON:  01/27/2022. FINDINGS: Low lung volume. There are probable atelectatic changes/scarring at the lung bases. No acute consolidation or lung collapse. Apparent blunting of bilateral lateral costophrenic angles may be due to trace pleural effusion versus superimposed soft tissue. No pneumothorax. Stable cardio-mediastinal silhouette. No acute osseous abnormalities. The soft tissues are within normal limits. Presumed VP shunt tubing noted along the right lateral chest. IMPRESSION: *Bibasilar atelectasis/scarring. No acute consolidation or lung collapse. Electronically Signed   By: Jules Schick M.D.   On: 04/27/2023 14:24     Assessment and Plan:   Pulm HTN -Can be difficult to sort out how much is due to pulmonary hypertension versus COPD. - Right Heart Cardiac catheterization was discussed with the patient fully. The patient understands that risks include but are not limited to stroke (1 in 1000), death (1 in 1000), kidney failure [usually temporary] (1 in 500), bleeding (1 in 200), allergic reaction [possibly serious] (1 in  200).  The patient understands and is willing to proceed.   -Continue current therapy  2. ?pulm edema -BNP on admission was 116.9.  Then dropped to 53.1 on 12/9 and was up to 290.8 on 12/10. -On 12/08, he got Lasix 40 mg IV x 1 -He is not on diuretics at home and that is his only diuretic dose here. -CXR on 12/09 showed improving aeration in the left  lung base, indicative of resolving airspace consolidation, no CXR on 12/10  3.  CKD 3 -His creatinine is generally in the mid ones. -This admission,1.21 >> 1.32 >> 1.53 >> 2.14 >> 1.46 today - BUN 22 >> 22 >> 39 >> 53 >> 46 today -Since he got a BUN/creatinine bump up to 53/2.14 after his dose of Lasix, I will hold off on ordering more, and discuss with MD  Otherwise, per IM   Risk Assessment/Risk Scores:       New York Heart Association (NYHA) Functional Class NYHA Class IV   For questions or updates, please contact [facility] Please consult [email or web address] for contact info under    Signed, Theodore Demark, PA-C  05/01/2023 11:12 AM

## 2023-05-01 NOTE — Progress Notes (Signed)
NAME:  Jeremiah [name], MRN:  [id number], DOB:  [birth year], LOS: 3 ADMISSION DATE:  04/27/2023, CONSULTATION DATE:  04/30/2023 REFERRING MD:  Thedore Mins, CHIEF COMPLAINT:     History of Present Illness:  82 year old male with CAD status post PCI, chronic respiratory failure on O2 2 L - Pulm HTN,  NPH s/p VP shunt, diabetes mellitus type 2, HLP, HTN, BPH, CKD stage IIIa, OSA on CPAP, prostate CA, skin CA presented to ED 04/27/23 with shortness of breath and hypoxia.  Patient has not been using his home O2.  Caregiver went over to his house and noted that his O2 sats were in the 70s on room air and cyanotic lips.  When EMS arrived, he was 80% on room air and was placed on 3 L O2 via [place], improved sats to mid 90s.  In ED, patient was noted to be still hypoxic and was placed on 6 L O2 via [place]. Per patient, he has been feeling short of breath for last 1 year, has been on home O2 2 L but does not use it regularly.  Patient is also noncompliant with CPAP.  Lives at home with his wife who has dementia and they have a caregiver who visits 3 times a week.  No chest pain, cough or fevers. Today in  NAD at rest, tachypneic, afebrile, no leukocytosis, troponins negative, creatinine 1.2, BNP 116.  CTA chest negative for PE, moderate to marked right hemidiaphragm elevation with persistent small right pleural effusion, similar bibasilar volume loss, pulmonary arterial hypertension.  Pulm consult 12/9 for continued dyspnea with everything ruled out.  Pertinent  Medical History  CAD status post PCI, chronic respiratory failure on O2 2 L - Pulm HTN,  NPH s/p VP shunt, diabetes mellitus type 2, HLP, HTN, BPH, CKD stage IIIa, OSA on CPAP, prostate CA, skin CA presented to ED with shortness of breath and hypoxia. Non compliant with oxygen and CPAP  Significant Hospital Events: Including procedures, antibiotic start and stop dates in addition to other pertinent events   ER on 12/6, Admission 12/7  Interim History / Subjective:   Wore CPAP part of the night-- typical for him. He has trouble sleeping with it on, so doesn't wear it all night.  Up to 8L [facility]. Has been up to chair during the day.  Afebrile overnight. Denies coughing, fevers, chills.   Objective   Blood pressure (!) 141/66, pulse (!) 59, temperature 97.6 F (36.4 C), temperature source Axillary, resp. rate (!) 24, height 5\' 10"  (1.778 m), weight 86.6 kg, SpO2 92%.        Intake/Output Summary (Last 24 hours) at 05/01/2023 0849 Last data filed at 04/30/2023 1900 Gross per 24 hour  Intake 131.21 ml  Output --  Net 131.21 ml   Filed Weights   04/27/23 1148  Weight: 86.6 kg    Examination: General:  chronically ill appearing man lying in bed in NAD HENT:  [place]/AT, eyes anicteric Lungs: breathing comfortably on 8L [place], saturating around 90%. Decreased R basilar breath sounds, CTA anteriorly.  Cardiovascular: S1S2, RRR Abdomen: obese, soft, NT Extremities:  no cyanosis Neuro: awake, answering questions appropriately, moving extremities Derm: warm, dry  Bicarb 36 BUN 46 Cr 1/46 BNP 291 WBC 7.5 H/H 14.7/47.8 Platelets 145  CT chest personally reviewed> VP shunt terminating in R pleural space, a/w chronic pleural irregularity and fat deposition in the pleural space. Atelectasis vs consolidation with dilated vessels coursing through the RLL.   No PFTs on file.  Resolved Hospital Problem list     Assessment & Plan:  Acute on chronic hypoxic & hypercapnic respiratory failure Noncompliant with  home oxygen & CPAP CTA with pulmonary artery enlargement suspicious for chronic PH-- likely WHO group II & III. RV mildly reduced function on echo. Concern for shunt physiology through RLL-- has enlarged, well perfused vessels going through it> unsure if this is acute or chronic. Appears more consolidated on this CT compared to 2023.  Acute pulmonary edema is likely with elevated BNP  Plan -With undetectably low PCT, likely no role for  antibiotics -recommend diuresis; can use BNP to guide -OOB mobility -IS, flutter valve -con't bronchodilators, steroids -Encouraged compliance with CPAP entire night/ while sleeping. May need to talk to his [facility] company about a different mask/ cannula for his machine.  -Wean O2. If he has shunt, oxygenation will change minimally as oxygen is weaned down. Can also confirm shunt on RHC.  -Needs to get home concentrator checked if he is reporting it doesn't work as well as his [employer].    Labs   CBC: Recent Labs  Lab 04/27/23 1156 04/29/23 0546 04/30/23 0457 05/01/23 0435  WBC 7.5 10.6* 10.4 7.5  NEUTROABS  --  9.8* 9.4* 7.1  HGB 15.4 14.3 14.9 14.7  HCT 49.3 46.5 48.9 47.8  MCV 103.6* 103.3* 104.7* 101.9*  PLT 159 134* 143* 145*    Basic Metabolic Panel: Recent Labs  Lab 04/27/23 1327 04/28/23 0446 04/29/23 0546 04/30/23 0457 05/01/23 0435  NA 148* 148* 147* 141 141  K 4.5 3.8 4.3 4.5 4.9  CL 103 103 105 97* 99  CO2 37* 35* 31 36* 36*  GLUCOSE 179* 134* 203* 193* 207*  BUN 22 22 39* 53* 46*  CREATININE 1.21 1.32* 1.53* 2.14* 1.46*  CALCIUM 8.8* 8.7* 8.8* 8.5* 8.7*  MG  --   --  2.0 2.2 2.1   GFR: Estimated Creatinine Clearance: 39.6 mL/min (A) (by C-G formula based on SCr of 1.46 mg/dL (H)). Recent Labs  Lab 04/27/23 1156 04/29/23 0546 04/30/23 0457 05/01/23 0435  PROCALCITON  --  <0.10 <0.10 <0.10  WBC 7.5 10.6* 10.4 7.5    Steffanie Dunn, DO 05/01/23 9:03 AM [facility]  For contact information, see Amion. If no response to pager, please call PCCM consult pager. After hours, 7PM- 7AM, please call El[facility].

## 2023-05-01 NOTE — H&P (View-Only) (Signed)
Cardiology Consultation   Patient ID: Jeremiah [name] MRN: [id number]; DOB: [birth year]  Admit date: 04/27/2023 Date of Consult: 05/01/2023  PCP:  Jeremiah Ran, MD   [facility] Providers Cardiologist:  Jeremiah Swaziland, MD        Patient Profile:   Jeremiah [name] is a 82 y.o. male with a hx of CAD status post PCI, chronic respiratory failure on O2 2 L - Pulm HTN, NPH s/p VP shunt, diabetes mellitus type 2, HLP, HTN, BPH, CKD stage IIIa, OSA on CPAP with poor compliance, prostate CA, skin CA, was admitted 12/06 with SOB and hypoxia.    No PE on CT, elevated R hemidiaphragm, right pleural effusion, atelectasis and scar.  Pulmonary artery enlargement noted, suggesting PAH.  No acute cause for his dyspnea was found.  CCM was called and he was seen by Dr. Chestine Spore.  He was still requiring up to 8 L HHFNC.  Dr. Chestine Spore had significant concern for chronic pulmonary hypertension, likely WHO group 2 and 3.  He had mildly reduced RV function on his echo.  She was concerned about shunt physiology through his right lower lobe.  She was also worried about pulmonary edema because BNP was elevated.  Cardiology was asked to evaluate him on 05/01/2023 for pulmonary hypertension by Dr. Thedore Mins and Dr. Chestine Spore   History of Present Illness:   Jeremiah [name] says that in the not too distant past, he would use his pulse ox machine and be able to get his sats into the mid 90s by taking deep breaths.  He also notes that recently when he tried to do things around the house and yard, he would become exhausted.  He was trying to do these things without oxygen which he says he rarely wore.  He cannot really say much about how long ago his symptoms started worsening.  During these periods of exhaustion, he was not checking his oxygen levels.  However, his main symptom of hypoxia is fatigue and weakness.   Since being in the hospital and on higher levels of O2, he is feeling much better.    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   S/P radiation therapy 04/23/13 - 05/29/14   Prostate/seminal vesicles, external beam 4500 cGy in 25 sessions   Seasonal allergies    Skin cancer    scalp   Small bowel obstruction (HCC) 06/10/2016   Wears glasses     Past Surgical History:  Procedure Laterality Date   BACK SURGERY  2009   CARDIAC CATHETERIZATION     COLONOSCOPY W/ BIOPSIES AND POLYPECTOMY     CORONARY STENT INTERVENTION N/A 01/24/2022   Procedure: CORONARY STENT INTERVENTION;  Surgeon: [name], Jeremiah M, MD;  Location: [facility] INVASIVE CV LAB;  Service: Cardiovascular;  Laterality: N/A;   CYSTOSCOPY W/ URETERAL STENT PLACEMENT Left 05/12/2021   Procedure: CYSTOSCOPY WITH RETROGRADE PYELOGRAM/ LEFT URETERAL STENT PLACEMENT.;  Surgeon: Jannifer Hick, MD;  Location: [facility] OR;  Service: Urology;  Laterality: Left;   HYDROCELE EXCISION  1963   LEFT  HEART CATH AND CORONARY ANGIOGRAPHY N/A 03/14/2019   Procedure: LEFT HEART CATH AND CORONARY ANGIOGRAPHY;  Surgeon: Kathleene Hazel, MD;  Location: [facility] INVASIVE CV LAB;  Service: Cardiovascular;  Laterality: N/A;   LEFT HEART CATH AND CORONARY ANGIOGRAPHY N/A 01/24/2022   Procedure: LEFT HEART CATH AND CORONARY ANGIOGRAPHY;  Surgeon: [name], Jeremiah M, MD;  Location: [facility] INVASIVE CV LAB;  Service: Cardiovascular;  Laterality: N/A;   PROSTATE BIOPSY  2013, 2014, 01/2014   Gleason 7   RADIOACTIVE SEED IMPLANT N/A 07/01/2014   Procedure: RADIOACTIVE SEED IMPLANT;  Surgeon: Valetta Fuller, MD;  Location: [facility];  Service: Urology;  Laterality: N/A;  DR PORTABLE   TONSILLECTOMY      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   Procedure: SHUNT INSERTION VENTRICULAR-PERITONEAL;  Surgeon: Tia Alert, MD;  Location: [facility] OR;  Service: Neurosurgery;  Laterality: N/A;  SHUNT INSERTION VENTRICULAR-PERITONEAL     Home Medications:  Prior to Admission medications   Medication Sig Start Date End Date Taking? Authorizing Provider  aspirin EC 81 MG tablet Take 81 mg by mouth daily.    Yes [provider]  carvedilol (COREG) 12.5 MG tablet Take 1 tablet (12.5 mg total) by mouth 2 (two) times daily. 01/25/22  Yes Almon Hercules, MD  cetirizine (ZYRTEC) 10 MG tablet Take 10 mg by mouth daily.   Yes [provider]  cholecalciferol (VITAMIN D3) 25 MCG (1000 UNIT) tablet Take 2,000 Units by mouth daily.   Yes [provider]  clopidogrel (PLAVIX) 75 MG tablet Take 1 tablet (75 mg total) by mouth daily with breakfast. 02/13/22  Yes Azalee Course, PA  ezetimibe (ZETIA) 10 MG tablet Take 10 mg by mouth daily. 05/02/21  Yes [provider]  metFORMIN (GLUCOPHAGE-XR) 500 MG 24 hr tablet Take 500 mg by mouth daily. 01/11/22  Yes [provider]  Multiple Vitamins-Minerals (PRESERVISION AREDS) CAPS Take 2 capsules by mouth daily.   Yes [provider]  pantoprazole (PROTONIX) 20 MG tablet Take 20 mg by mouth daily before breakfast.  02/19/19  Yes [provider]  rosuvastatin (CRESTOR) 10 MG tablet Take 1 tablet (10 mg total) by mouth daily. 01/25/22 04/28/23 Yes Almon Hercules, MD  vitamin B-12 (CYANOCOBALAMIN) 500 MCG tablet Take 500-1,000 mcg by mouth daily. Alternate and by mouth every other day.   Yes [provider]    Inpatient Medications: Scheduled Meds:  amoxicillin-clavulanate  1 tablet Oral BID   arformoterol  15 mcg Nebulization BID   aspirin EC  81 mg Oral Daily   budesonide (PULMICORT) nebulizer solution  0.25 mg Nebulization BID   carvedilol  3.125 mg Oral BID   cholecalciferol  2,000 Units Oral Daily    clopidogrel  75 mg Oral Q breakfast   vitamin B-12  500 mcg Oral QODAY   And   vitamin B-12  1,000 mcg Oral QODAY   enoxaparin (LOVENOX) injection  30 mg Subcutaneous Q24H   ezetimibe  10 mg Oral Daily   insulin aspart  0-5 Units Subcutaneous QHS   insulin aspart  0-9 Units Subcutaneous TID WC   insulin glargine-yfgn  12 Units Subcutaneous Daily   ipratropium-albuterol  3 mL Nebulization BID   methylPREDNISolone (SOLU-MEDROL) injection  30 mg Intravenous Q24H   pantoprazole  40 mg Oral Q0600   pneumococcal 20-valent conjugate vaccine  0.5 mL Intramuscular Tomorrow-1000   rosuvastatin  10 mg Oral Daily   Continuous Infusions:  PRN Meds: acetaminophen **OR** acetaminophen  Allergies:    Allergies  Allergen Reactions   Cefepime Other (See Comments)    toxic encephalopathy   Lipitor [Atorvastatin] Other (See Comments)    Hip pain    Social History:   Social History   Socioeconomic History   Marital status: Married    Spouse name: Not on file   Number of children: 2   Years of education: 13   Highest education level: Some college, no degree  Occupational History   Occupation: retired    Comment: all Equities trader  Tobacco Use   Smoking status: Former    Current packs/day: 0.00    Types: Cigarettes    Quit date: 03/10/1961    Years since quitting: 62.1   Smokeless tobacco: Never   Tobacco comments:    Pt states he quit smoking 60 plus years ago and he remembers smoking maybe a pack a day. ALS 04/20/22  Vaping Use   Vaping status: Never Used  Substance and Sexual Activity   Alcohol use: Not Currently    Alcohol/week: 1.0 standard drink of alcohol    Types: 1 Glasses of wine per week    Comment: rare alcohol   Drug use: No   Sexual activity: Not on file  Other Topics Concern   Not on file  Social History Narrative   Rare caffeine use    Social Determinants of Health   Financial Resource Strain: Not on file  Food Insecurity: No Food Insecurity  (04/28/2023)   Hunger Vital Sign    Worried About Running Out of Food in the Last Year: Never true    [name] Out of Food in the Last Year: Never true  Transportation Needs: No Transportation Needs (04/28/2023)   PRAPARE - Administrator, Civil Service (Medical): No    Lack of Transportation (Non-Medical): No  Physical Activity: Unknown (06/13/2018)   Exercise Vital Sign    Days of Exercise per Week: Patient declined    Minutes of Exercise per Session: Patient declined  Stress: No Stress Concern Present (06/13/2018)   [employer] of Occupational Health - Occupational Stress Questionnaire    Feeling of Stress : Only a little  Social Connections: Unknown (06/13/2018)   Social Connection and Isolation Panel [NHANES]    Frequency of Communication with Friends and Family: More than three times a week    Frequency of Social Gatherings with Friends and Family: More than three times a week    Attends Religious Services: Not on file    Active Member of Clubs or Organizations: Not on file    Attends Banker Meetings: Not on file    Marital Status: Not on file  Intimate Partner Violence: Not At Risk (04/28/2023)   Humiliation, Afraid, Rape, and Kick questionnaire    Fear of Current or Ex-Partner: No    Emotionally Abused: No    Physically Abused: No    Sexually Abused: No    Family History:   Family History  Problem Relation Age of Onset   Heart attack Father 64   Cancer Father        prostate   Diabetes Father    Cancer Paternal Uncle        prostate   Dementia Mother 33   Lung cancer Brother      ROS:  Please see the history of present illness.  All other ROS reviewed and negative.     Physical Exam/Data:   Vitals:   05/01/23 0500 05/01/23 0800 05/01/23  8657 05/01/23 0950  BP: 118/70 (!) 141/66    Pulse:  (!) 59  83  Resp:  (!) 24    Temp: 98.2 F (36.8 C) 97.6 F (36.4 C)    TempSrc: Oral Axillary    SpO2:  93% 92%   Weight:      Height:         Intake/Output Summary (Last 24 hours) at 05/01/2023 1112 Last data filed at 05/01/2023 8469 Gross per 24 hour  Intake 131.21 ml  Output 200 ml  Net -68.79 ml      04/27/2023   11:48 AM 01/17/2023    9:26 AM 07/19/2022    8:28 AM  Last 3 Weights  Weight (lbs) 190 lb 14.7 oz 191 lb 188 lb  Weight (kg) 86.6 kg 86.637 kg 85.276 kg     Body mass index is 27.39 kg/m.  General:  Well nourished, well developed, in no acute distress on O2 HEENT: normal Neck: no JVD seen, difficult to assess secondary to body habitus Vascular: No carotid bruits; Distal pulses 2+ bilaterally Cardiac:  normal S1, S2; RRR; no murmur  Lungs: Rales and some rhonchi bilaterally, no wheezing Abd: soft, nontender, no hepatomegaly  Ext: no edema Musculoskeletal:  No deformities, BUE and BLE strength normal and equal Skin: warm and dry  Neuro:  CNs 2-12 intact, no focal abnormalities noted Psych:  Normal affect   EKG:  The EKG was personally reviewed and demonstrates: 12/06  ECG is sinus rhythm, heart rate 78, right bundle branch block is old Telemetry:  Telemetry was personally reviewed and demonstrates: Sinus rhythm  Relevant CV Studies:  ECHO: 04/28/2023  1. Left ventricular ejection fraction, by estimation, is 60 to 65%. The left ventricle has normal function. The left ventricle has no regional wall motion abnormalities. There is moderate LVH. Left ventricular diastolic parameters are consistent with Grade I diastolic dysfunction (impaired relaxation).   2. Right ventricular systolic function is mildly reduced. The right  ventricular size is normal.   3. The mitral valve is normal in structure. No evidence of mitral valve regurgitation. No evidence of mitral stenosis.   4. The aortic valve is tricuspid. There is mild calcification of the  aortic valve. There is mild thickening of the aortic valve. Aortic valve regurgitation is trivial. Aortic valve sclerosis is present, with no evidence of aortic valve  stenosis.   5. Aortic dilatation noted. There is mild dilatation of the ascending aorta, measuring 41 mm.   6. The inferior vena cava is normal in size with greater than 50% respiratory variability, suggesting right atrial pressure of 3 mmHg.   CARDIAC CATH: 01/24/2022   Prox LAD lesion is 50% stenosed.   1st Diag lesion is 50% stenosed.   Prox RCA to Mid RCA lesion is 15% stenosed.   RPDA lesion is 90% stenosed.   A drug-eluting stent was successfully placed using a STENT ONYX FRONTIER 2.5X12.   Post intervention, there is a 0% residual stenosis.   LV end diastolic pressure is normal.   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 Anticipate DC in am Diagnostic Dominance: Right  Intervention    Laboratory Data:  High Sensitivity Troponin:   Recent Labs  Lab 04/27/23 1210 04/27/23 1327  TROPONINIHS 7 9     Chemistry Recent Labs  Lab 04/29/23 0546 04/30/23 0457 05/01/23 0435  NA 147* 141 141  K 4.3 4.5 4.9  CL 105 97* 99  CO2 31 36* 36*  GLUCOSE 203* 193* 207*  BUN 39* 53* 46*  CREATININE 1.53* 2.14* 1.46*  CALCIUM 8.8* 8.5* 8.7*  MG 2.0 2.2 2.1  GFRNONAA 45* 30* 47*  ANIONGAP 11 8 6     Recent Labs  Lab 04/27/23 1327  PROT 5.8*  ALBUMIN 3.3*  AST 14*  ALT 9  ALKPHOS 54  BILITOT 1.2*   Lipids No results for input(s): "CHOL", "TRIG", "HDL", "LABVLDL", "LDLCALC", "CHOLHDL" in the last 168 hours.  Hematology Recent Labs  Lab 04/29/23 0546 05/28/2023 0457 05/01/23 0435  WBC 10.6* 10.4 7.5  RBC 4.50 4.67 4.69  HGB 14.3 14.9 14.7  HCT 46.5 48.9 47.8  MCV 103.3* 104.7* 101.9*  MCH 31.8 31.9 31.3  MCHC 30.8 30.5 30.8  RDW 14.6 14.4 14.3  PLT 134* 143* 145*   BNP Recent Labs  Lab 04/29/23 0546 2023/05/28 0457 05/01/23 0435  BNP 114.8* 53.1 290.8*      Radiology/Studies:  US RENAL  Result Date: May 28, 2023 CLINICAL DATA:  Acute kidney injury EXAM: RENAL  / URINARY TRACT ULTRASOUND COMPLETE COMPARISON:  CT abdomen pelvis 05/27/2021 FINDINGS: Right Kidney: Renal measurements: 9.0 x 6.0 x 3.8 cm = volume: 90 mL. Visualization of the right kidney is limited due to adjacent shadowing bowel gas. Numerous right renal calculi seen on prior CT are not definitively identified on the current exam, however this is likely due to limited visualization rather than resolution. Left Kidney: Renal measurements: 9.0 x 4.1 x 4.5 cm = volume: 86 mL. Echogenicity within normal limits. No mass or hydronephrosis visualized. Bladder: Appears normal for degree of bladder distention. Other: None. IMPRESSION: 1. No hydronephrosis. 2. Limited visualization of the right kidney. No significant abnormality of the left kidney. Electronically Signed   By: Acquanetta Belling M.D.   On: May 28, 2023 10:17   DG Chest Port 1 View  Result Date: 05-28-2023 CLINICAL DATA:  82 year old male with history of shortness of breath. EXAM: PORTABLE CHEST 1 VIEW COMPARISON:  Chest x-ray 04/29/2023. FINDINGS: Elevation of the right hemidiaphragm again noted. Bibasilar opacities which may reflect areas of atelectasis and/or consolidation. Substantially improved aeration in the left lung base. No definite pleural effusions. No pneumothorax. No evidence of pulmonary edema. Heart size is normal. The patient is rotated to the right on today's exam, resulting in distortion of the mediastinal contours and reduced diagnostic sensitivity and specificity for mediastinal pathology. Atherosclerotic calcifications are noted in the thoracic aorta. IMPRESSION: 1. Persistent low lung volumes with bibasilar areas of atelectasis and/or consolidation, but with improving aeration in the left lung base, indicative of resolving airspace consolidation. 2. Aortic atherosclerosis. Electronically Signed   By: Trudie Reed M.D.   On: 05/28/2023 07:33   DG Chest Port 1 View  Result Date: 04/29/2023 CLINICAL DATA:  Shortness of breath EXAM:  PORTABLE CHEST 1 VIEW COMPARISON:  04/27/2023 FINDINGS: Asymmetric elevation right hemidiaphragm. The cardio pericardial silhouette is enlarged. There is pulmonary vascular congestion without overt pulmonary edema. Left base collapse/consolidation with small to moderate left pleural effusion. IMPRESSION: 1. Interval progression of left base collapse/consolidation with small to moderate left pleural effusion. 2. Pulmonary vascular congestion without overt pulmonary edema. Electronically Signed   By: Kennith Center M.D.   On: 04/29/2023 08:50   ECHOCARDIOGRAM COMPLETE  Result Date: 04/28/2023    ECHOCARDIOGRAM REPORT   Patient Name:   ELAINE BASORE Date of Exam: 04/28/2023 Medical Rec #:  [id number]  Height:       70.0 in Accession #:    [id number]      Weight:       190.9 lb Date of Birth:  11/13/1940       BSA:          2.047 m Patient Age:    82 years        BP:           131/71 mmHg Patient Gender: M               HR:           79 bpm. Exam Location:  Inpatient Procedure: 2D Echo, Color Doppler, Cardiac Doppler and Intracardiac            Opacification Agent Indications:    Pulmonary Hypertension  History:        Patient has prior history of Echocardiogram examinations, most                 recent 01/21/2022. NSTEMI; Risk Factors:Sleep Apnea, Dyslipidemia,                 Hypertension and Diabetes.  Sonographer:    [facility] Referring Phys: [id number] VASUNDHRA RATHORE IMPRESSIONS  1. Left ventricular ejection fraction, by estimation, is 60 to 65%. The left ventricle has normal function. The left ventricle has no regional wall motion abnormalities. There is moderate left ventricular hypertrophy. Left ventricular diastolic parameters are consistent with Grade I diastolic dysfunction (impaired relaxation).  2. Right ventricular systolic function is mildly reduced. The right ventricular size is normal.  3. The mitral valve is normal in structure. No evidence of mitral valve regurgitation. No evidence of mitral stenosis.   4. The aortic valve is tricuspid. There is mild calcification of the aortic valve. There is mild thickening of the aortic valve. Aortic valve regurgitation is trivial. Aortic valve sclerosis is present, with no evidence of aortic valve stenosis.  5. Aortic dilatation noted. There is mild dilatation of the ascending aorta, measuring 41 mm.  6. The inferior vena cava is normal in size with greater than 50% respiratory variability, suggesting right atrial pressure of 3 mmHg. FINDINGS  Left Ventricle: Left ventricular ejection fraction, by estimation, is 60 to 65%. The left ventricle has normal function. The left ventricle has no regional wall motion abnormalities. Definity contrast agent was given IV to delineate the left ventricular  endocardial borders. The left ventricular internal cavity size was normal in size. There is moderate left ventricular hypertrophy. Left ventricular diastolic parameters are consistent with Grade I diastolic dysfunction (impaired relaxation). Right Ventricle: The right ventricular size is normal. No increase in right ventricular wall thickness. Right ventricular systolic function is mildly reduced. Left Atrium: Left atrial size was normal in size. Right Atrium: Right atrial size was normal in size. Pericardium: There is no evidence of pericardial effusion. Mitral Valve: The mitral valve is normal in structure. No evidence of mitral valve regurgitation. No evidence of mitral valve stenosis. Tricuspid Valve: The tricuspid valve is normal in structure. Tricuspid valve regurgitation is not demonstrated. No evidence of tricuspid stenosis. Aortic Valve: The aortic valve is tricuspid. There is mild calcification of the aortic valve. There is mild thickening of the aortic valve. Aortic valve regurgitation is trivial. Aortic valve sclerosis is present, with no evidence of aortic valve stenosis. Aortic valve mean gradient measures 5.0 mmHg. Aortic valve peak gradient measures 9.9 mmHg. Aortic valve  area, by VTI measures 2.86 cm. Pulmonic Valve: The  pulmonic valve was normal in structure. Pulmonic valve regurgitation is not visualized. No evidence of pulmonic stenosis. Aorta: Aortic dilatation noted. There is mild dilatation of the ascending aorta, measuring 41 mm. Venous: The inferior vena cava is normal in size with greater than 50% respiratory variability, suggesting right atrial pressure of 3 mmHg. IAS/Shunts: The interatrial septum was not well visualized.  LEFT VENTRICLE PLAX 2D LVIDd:         4.40 cm   Diastology LVIDs:         3.00 cm   LV e' medial:    6.96 cm/s LV PW:         1.20 cm   LV E/e' medial:  10.6 LV IVS:        1.40 cm   LV e' lateral:   11.10 cm/s LVOT diam:     2.10 cm   LV E/e' lateral: 6.7 LV SV:         91 LV SV Index:   45 LVOT Area:     3.46 cm  RIGHT VENTRICLE RV Basal diam:  3.10 cm RV Mid diam:    3.40 cm RV S prime:     14.80 cm/s TAPSE (M-mode): 1.9 cm LEFT ATRIUM             Index        RIGHT ATRIUM           Index LA diam:        3.80 cm 1.86 cm/m   RA Area:     18.80 cm LA Vol (A2C):   49.3 ml 24.09 ml/m  RA Volume:   51.80 ml  25.31 ml/m LA Vol (A4C):   36.9 ml 18.03 ml/m LA Biplane Vol: 44.1 ml 21.55 ml/m  AORTIC VALVE AV Area (Vmax):    2.85 cm AV Area (Vmean):   2.91 cm AV Area (VTI):     2.86 cm AV Vmax:           157.00 cm/s AV Vmean:          101.000 cm/s AV VTI:            0.320 m AV Peak Grad:      9.9 mmHg AV Mean Grad:      5.0 mmHg LVOT Vmax:         129.00 cm/s LVOT Vmean:        84.900 cm/s LVOT VTI:          0.264 m LVOT/AV VTI ratio: 0.82  AORTA Ao Root diam: 4.10 cm Ao Asc diam:  4.10 cm MITRAL VALVE               TRICUSPID VALVE MV Area (PHT): 3.42 cm    TR Peak grad:   34.6 mmHg MV Decel Time: 222 msec    TR Vmax:        294.00 cm/s MV E velocity: 73.90 cm/s MV A velocity: 96.40 cm/s  SHUNTS MV E/A ratio:  0.77        Systemic VTI:  0.26 m                            Systemic Diam: 2.10 cm Charlton Haws MD Electronically signed by Charlton Haws MD  Signature Date/Time: 04/28/2023/11:38:38 AM    Final    CT Angio Chest PE W and/or Wo Contrast  Result Date: 04/27/2023 CLINICAL DATA:  Shortness of breath and weakness EXAM: CT  ANGIOGRAPHY CHEST WITH CONTRAST TECHNIQUE: Multidetector CT imaging of the chest was performed using the standard protocol during bolus administration of intravenous contrast. Multiplanar CT image reconstructions and MIPs were obtained to evaluate the vascular anatomy. RADIATION DOSE REDUCTION: This exam was performed according to the departmental dose-optimization program which includes automated exposure control, adjustment of the mA and/or kV according to patient size and/or use of iterative reconstruction technique. CONTRAST:  75mL OMNIPAQUE IOHEXOL 350 MG/ML SOLN COMPARISON:  Plain film of earlier today.  CTA chest 01/27/2022. FINDINGS: Cardiovascular: The quality of this exam for evaluation of pulmonary embolism is moderate. The bolus is centered in the [facility] to the and there is minimal motion degradation. No evidence of pulmonary embolism. Aortic atherosclerosis. Mild ascending aortic dilatation including at 4.2 cm, similar. Tortuous descending thoracic aorta. Mild cardiomegaly, without pericardial effusion. Lad and right coronary artery calcification. Pulmonary artery enlargement, outflow tract 3.7 cm. Mediastinum/Nodes: No mediastinal or hilar adenopathy. Lungs/Pleura: Small right pleural effusion, minimally increased. Moderate right hemidiaphragm elevation. VP shunt catheter again identified about the right chest wall, terminating in the right pleural space inferiorly. Right greater than left base volume loss and airspace disease, all favored to represent atelectasis and scar. Upper Abdomen: small dependent gallstones. Normal imaged portions of the liver, spleen, stomach, pancreas, adrenal glands. Musculoskeletal: No acute osseous abnormality. Review of the MIP images confirms the above findings. IMPRESSION: 1. No pulmonary  embolism with above limitations. 2. Moderate to marked right hemidiaphragm elevation with persistent small right pleural effusion and similar bibasilar volume loss with atelectasis and scar. 3. Similar mild ascending aortic dilatation at 4.2 cm. Recommend annual imaging followup by CTA or MRA. This recommendation follows 2010 ACCF/AHA/AATS/ACR/ASA/SCA/SCAI/SIR/STS/SVM Guidelines for the Diagnosis and Management of Patients with Thoracic Aortic Disease. Circulation. 2010; 121: K440-N027. Aortic aneurysm NOS (ICD10-I71.9) 4. Pulmonary artery enlargement suggests pulmonary arterial hypertension. 5. Cholelithiasis 6. Aortic Atherosclerosis (ICD10-I70.0). Coronary artery atherosclerosis. Electronically Signed   By: Jeronimo Greaves M.D.   On: 04/27/2023 17:34   DG Chest Port 1 View  Result Date: 04/27/2023 CLINICAL DATA:  Shortness of breath.  Fatigue. EXAM: PORTABLE CHEST 1 VIEW COMPARISON:  01/27/2022. FINDINGS: Low lung volume. There are probable atelectatic changes/scarring at the lung bases. No acute consolidation or lung collapse. Apparent blunting of bilateral lateral costophrenic angles may be due to trace pleural effusion versus superimposed soft tissue. No pneumothorax. Stable cardio-mediastinal silhouette. No acute osseous abnormalities. The soft tissues are within normal limits. Presumed VP shunt tubing noted along the right lateral chest. IMPRESSION: *Bibasilar atelectasis/scarring. No acute consolidation or lung collapse. Electronically Signed   By: Jules Schick M.D.   On: 04/27/2023 14:24     Assessment and Plan:   Pulm HTN -Can be difficult to sort out how much is due to pulmonary hypertension versus COPD. - Right Heart Cardiac catheterization was discussed with the patient fully. The patient understands that risks include but are not limited to stroke (1 in 1000), death (1 in 1000), kidney failure [usually temporary] (1 in 500), bleeding (1 in 200), allergic reaction [possibly serious] (1 in  200).  The patient understands and is willing to proceed.   -Continue current therapy  2. ?pulm edema -BNP on admission was 116.9.  Then dropped to 53.1 on 12/9 and was up to 290.8 on 12/10. -On 12/08, he got Lasix 40 mg IV x 1 -He is not on diuretics at home and that is his only diuretic dose here. -CXR on 12/09 showed improving aeration in the left  lung base, indicative of resolving airspace consolidation, no CXR on 12/10  3.  CKD 3 -His creatinine is generally in the mid ones. -This admission,1.21 >> 1.32 >> 1.53 >> 2.14 >> 1.46 today - BUN 22 >> 22 >> 39 >> 53 >> 46 today -Since he got a BUN/creatinine bump up to 53/2.14 after his dose of Lasix, I will hold off on ordering more, and discuss with MD  Otherwise, per IM   Risk Assessment/Risk Scores:       New York Heart Association (NYHA) Functional Class NYHA Class IV   For questions or updates, please contact [facility] Please consult [email or web address] for contact info under    Signed, Theodore Demark, PA-C  05/01/2023 11:12 AM

## 2023-05-01 NOTE — Progress Notes (Signed)
PROGRESS NOTE                                                                                                                                                                                                             Patient Demographics:    Jeremiah [name], is a 82 y.o. male, DOB - [birth year], ZDG:[id number]  Outpatient Primary MD for the patient is Rodrigo Ran, MD    LOS - 3  Admit date - 04/27/2023    Chief Complaint  Patient presents with   Shortness of Breath   Weakness       Brief Narrative (HPI from H&P)   82 year old male with CAD status post PCI, chronic respiratory failure on O2 2 L - Pulm HTN,  NPH s/p VP shunt, diabetes mellitus type 2, HLP, HTN, BPH, CKD stage IIIa, OSA on CPAP, prostate CA, skin CA presented to ED with shortness of breath and hypoxia.  Patient has not been using his home O2.  Caregiver went over to his house and noted that his O2 sats were in the 70s on room air and cyanotic lips.  When EMS arrived, he was 80% on room air and was placed on 3 L O2 via [place], improved sats to mid 90s.  In ED, patient was noted to be still hypoxic and was placed on 6 L O2 via [place]. Per patient, he has been feeling short of breath for last 1 year, has been on home O2 2 L but does not use it regularly.  Patient is also noncompliant with CPAP.  Lives at home with his wife who has dementia and they have a caregiver who visits 3 times a week.  No chest pain, cough or fevers.   NAD, tachypneic, afebrile, no leukocytosis, troponins negative, creatinine 1.2, BNP 116.  CTA chest negative for PE, moderate to marked right hemidiaphragm elevation with persistent small right pleural effusion, similar bibasilar volume loss, pulmonary arterial hypertension.   Subjective:   Patient in bed, appears comfortable, denies any headache, no fever, no chest pain or pressure, mild shortness of breath , no abdominal pain. No new focal weakness.    Assessment  & Plan :    Acute on chronic respiratory failure with hypoxia (HCC) possibly due to acute bronchitis, pulmonary hypertension, atelectasis, pleural effusion.  Likely acute on chronic diastolic CHF.  EF  60%, H/O Pulm HTN.  Negative respiratory viral panel and stable procalcitonin, patient is on 2 L home O2 but noncompliant, has OSA with CPAP but noncompliant with CPAP as well.    Short of breath over the last 7 to 10 days or maybe even longer, mild orthopnea, BNP slightly elevated, chest x-ray suggestive of ?? mild fluid overload, minimal to no response to IV Lasix on 04/30/2023 and developed AKI, CTA negative for PE.  I-S and flutter valve for pulmonary toiletry.  Still no clear cause of his worsening hypoxia, question if this is due to worsening of his underlying pulmonary hypertension due to noncompliance with CPAP, pulmonary consulted, per pulmonary request cardiology consulted on 05/01/2023 for right heart catheterization.  For further management to pulmonary.  SpO2: 92 % O2 Flow Rate (L/min): (S) 8 L/min      Mild ascending aortic dilation - CT showing similar mild ascending aortic dilation at 4.2 cm.  Recommended annual imaging follow-up with CTA or MRA.  Beta-blocker for now.  Mild hypernatremia.  Resolved with some gentle IV fluids.  AKI on top of CKD stage IIIa after IV Lasix, Lasix had to be held improved after gentle hydration with IV fluids on 04/29/2023.   OSA Continue nightly CPAP.   CAD status post PCI in September 2023, HTN, hyperlipidemia -Currently no acute chest pain, on Coreg, aspirin, Zetia, DAPT and statin for secondary prevention continue.   Obesity Estimated body mass index is 27.39 kg/m, follow-up with PCP.  Diabetes mellitus type 2, uncontrolled with hyperglycemia  - Hemoglobin A1c 7.7, ISS add Semglee as sugars are high due to steroid use  Lab Results  Component Value Date   HGBA1C 7.7 (H) 04/28/2023   CBG (last 3)  Recent Labs    04/30/23 1651  04/30/23 2127 05/01/23 0806  GLUCAP 165* 238* 201*        Condition -  Guarded  Family Communication  : Son and wife on hospital phone on 04/29/2023, daughter Judeth Cornfield [phone] on 04/30/2023, patient's son [phone]  Rudell Cobb updated again on phone on 05/01/2023  Code Status :  FULL  Consults  : Pulmonary, Cards  PUD Prophylaxis : PPI   Procedures  :     CT - 1. No pulmonary embolism with above limitations. 2. Moderate to marked right hemidiaphragm elevation with persistent small right pleural effusion and similar bibasilar volume loss with atelectasis and scar. 3. Similar mild ascending aortic dilatation at 4.2 cm. Recommend annual imaging followup by CTA or MRA. 4. Pulmonary artery enlargement suggests pulmonary arterial hypertension. 5. Cholelithiasis 6. Aortic Atherosclerosis (ICD10-I70.0). Coronary artery atherosclerosis.  TTE - 1. Left ventricular ejection fraction, by estimation, is 60 to 65%. The left ventricle has normal function. The left ventricle has no regional wall motion abnormalities. There is moderate left ventricular hypertrophy. Left ventricular diastolic parameters are consistent with Grade I diastolic dysfunction (impaired relaxation).  2. Right ventricular systolic function is mildly reduced. The right ventricular size is normal.  3. The mitral valve is normal in structure. No evidence of mitral valve regurgitation. No evidence of mitral stenosis.  4. The aortic valve is tricuspid. There is mild calcification of the aortic valve. There is mild thickening of the aortic valve. Aortic valve regurgitation is trivial. Aortic valve sclerosis is present, with no evidence of aortic valve stenosis.  5. Aortic dilatation noted. There is mild dilatation of the ascending aorta, measuring 41 mm.  6. The inferior vena cava is normal in size with greater than 50% respiratory  variability, suggesting right atrial pressure of 3 mmHg.      Disposition Plan  :    Status is:  Inpatient  DVT Prophylaxis  :    enoxaparin (LOVENOX) injection 30 mg Start: 05/01/23 1000   Lab Results  Component Value Date   PLT 145 (L) 05/01/2023    Diet :  Diet Order             Diet Carb Modified Fluid consistency: Thin; Room service appropriate? Yes  Diet effective now                    Inpatient Medications  Scheduled Meds:  amoxicillin-clavulanate  1 tablet Oral BID   arformoterol  15 mcg Nebulization BID   aspirin EC  81 mg Oral Daily   budesonide (PULMICORT) nebulizer solution  0.25 mg Nebulization BID   carvedilol  3.125 mg Oral BID   cholecalciferol  2,000 Units Oral Daily   clopidogrel  75 mg Oral Q breakfast   vitamin B-12  500 mcg Oral QODAY   And   vitamin B-12  1,000 mcg Oral QODAY   enoxaparin (LOVENOX) injection  30 mg Subcutaneous Q24H   ezetimibe  10 mg Oral Daily   insulin aspart  0-5 Units Subcutaneous QHS   insulin aspart  0-9 Units Subcutaneous TID WC   insulin glargine-yfgn  12 Units Subcutaneous Daily   ipratropium-albuterol  3 mL Nebulization BID   methylPREDNISolone (SOLU-MEDROL) injection  30 mg Intravenous Q24H   pantoprazole  40 mg Oral Q0600   pneumococcal 20-valent conjugate vaccine  0.5 mL Intramuscular Tomorrow-1000   rosuvastatin  10 mg Oral Daily   Continuous Infusions:   PRN Meds:.acetaminophen **OR** acetaminophen  Antibiotics  :    Anti-infectives (From admission, onward)    Start     Dose/Rate Route Frequency Ordered Stop   04/30/23 2200  amoxicillin-clavulanate (AUGMENTIN) 500-125 MG per tablet 1 tablet        1 tablet Oral 2 times daily 04/30/23 1919     04/28/23 1115  amoxicillin-clavulanate (AUGMENTIN) 875-125 MG per tablet 1 tablet  Status:  Discontinued        1 tablet Oral Every 12 hours 04/28/23 1105 04/30/23 1919         Objective:   Vitals:   05/01/23 0500 05/01/23 0800 05/01/23 0808 05/01/23 0950  BP: 118/70 (!) 141/66    Pulse:  (!) 59  83  Resp:  (!) 24    Temp: 98.2 F (36.8 C)  97.6 F (36.4 C)    TempSrc: Oral Axillary    SpO2:  93% 92%   Weight:      Height:        Wt Readings from Last 3 Encounters:  04/27/23 86.6 kg  01/17/23 86.6 kg  07/19/22 85.3 kg     Intake/Output Summary (Last 24 hours) at 05/01/2023 0954 Last data filed at 05/01/2023 9147 Gross per 24 hour  Intake 131.21 ml  Output 200 ml  Net -68.79 ml     Physical Exam  Awake Alert, No new F.N deficits, Normal affect [place].AT,PERRAL Supple Neck, No JVD,   Symmetrical Chest wall movement, Good air movement bilaterally, +ve rales RRR,No Gallops,Rubs or new Murmurs,  +ve B.Sounds, Abd Soft, No tenderness,   No Cyanosis, Clubbing or edema      Data Review:    Recent Labs  Lab 04/27/23 1156 04/29/23 0546 04/30/23 0457 05/01/23 0435  WBC 7.5 10.6* 10.4 7.5  HGB 15.4 14.3  14.9 14.7  HCT 49.3 46.5 48.9 47.8  PLT 159 134* 143* 145*  MCV 103.6* 103.3* 104.7* 101.9*  MCH 32.4 31.8 31.9 31.3  MCHC 31.2 30.8 30.5 30.8  RDW 14.8 14.6 14.4 14.3  LYMPHSABS  --  0.3* 0.4* 0.3*  MONOABS  --  0.4 0.6 0.1  EOSABS  --  0.0 0.0 0.0  BASOSABS  --  0.0 0.0 0.0    Recent Labs  Lab 04/27/23 1155 04/27/23 1327 04/28/23 0446 04/29/23 0546 04/29/23 0715 04/30/23 0457 05/01/23 0435  NA  --  148* 148* 147*  --  141 141  K  --  4.5 3.8 4.3  --  4.5 4.9  CL  --  103 103 105  --  97* 99  CO2  --  37* 35* 31  --  36* 36*  ANIONGAP  --  8 10 11   --  8 6  GLUCOSE  --  179* 134* 203*  --  193* 207*  BUN  --  22 22 39*  --  53* 46*  CREATININE  --  1.21 1.32* 1.53*  --  2.14* 1.46*  AST  --  14*  --   --   --   --   --   ALT  --  9  --   --   --   --   --   ALKPHOS  --  54  --   --   --   --   --   BILITOT  --  1.2*  --   --   --   --   --   ALBUMIN  --  3.3*  --   --   --   --   --   CRP  --   --   --   --  7.6* 4.6* 2.3*  PROCALCITON  --   --   --  <0.10  --  <0.10 <0.10  HGBA1C  --   --  7.7*  --   --   --   --   BNP 116.9*  --   --  114.8*  --  53.1 290.8*  MG  --   --   --  2.0   --  2.2 2.1  CALCIUM  --  8.8* 8.7* 8.8*  --  8.5* 8.7*      Recent Labs  Lab 04/27/23 1155 04/27/23 1327 04/28/23 0446 04/29/23 0546 04/29/23 0715 04/30/23 0457 05/01/23 0435  CRP  --   --   --   --  7.6* 4.6* 2.3*  PROCALCITON  --   --   --  <0.10  --  <0.10 <0.10  HGBA1C  --   --  7.7*  --   --   --   --   BNP 116.9*  --   --  114.8*  --  53.1 290.8*  MG  --   --   --  2.0  --  2.2 2.1  CALCIUM  --  8.8* 8.7* 8.8*  --  8.5* 8.7*    --------------------------------------------------------------------------------------------------------------- Lab Results  Component Value Date   CHOL 140 03/14/2022   HDL 44 03/14/2022   LDLCALC 66 03/14/2022   TRIG 181 (H) 03/14/2022   CHOLHDL 3.2 03/14/2022    Lab Results  Component Value Date   HGBA1C 7.7 (H) 04/28/2023   Radiology Reports US RENAL  Result Date: 04/30/2023 CLINICAL DATA:  Acute kidney injury EXAM: RENAL / URINARY TRACT ULTRASOUND COMPLETE COMPARISON:  CT abdomen pelvis 05/27/2021 FINDINGS: Right Kidney: Renal measurements: 9.0 x 6.0 x 3.8 cm = volume: 90 mL. Visualization of the right kidney is limited due to adjacent shadowing bowel gas. Numerous right renal calculi seen on prior CT are not definitively identified on the current exam, however this is likely due to limited visualization rather than resolution. Left Kidney: Renal measurements: 9.0 x 4.1 x 4.5 cm = volume: 86 mL. Echogenicity within normal limits. No mass or hydronephrosis visualized. Bladder: Appears normal for degree of bladder distention. Other: None. IMPRESSION: 1. No hydronephrosis. 2. Limited visualization of the right kidney. No significant abnormality of the left kidney. Electronically Signed   By: Acquanetta Belling M.D.   On: 04/30/2023 10:17   DG Chest Port 1 View  Result Date: 04/30/2023 CLINICAL DATA:  82 year old male with history of shortness of breath. EXAM: PORTABLE CHEST 1 VIEW COMPARISON:  Chest x-ray 04/29/2023. FINDINGS: Elevation of the  right hemidiaphragm again noted. Bibasilar opacities which may reflect areas of atelectasis and/or consolidation. Substantially improved aeration in the left lung base. No definite pleural effusions. No pneumothorax. No evidence of pulmonary edema. Heart size is normal. The patient is rotated to the right on today's exam, resulting in distortion of the mediastinal contours and reduced diagnostic sensitivity and specificity for mediastinal pathology. Atherosclerotic calcifications are noted in the thoracic aorta. IMPRESSION: 1. Persistent low lung volumes with bibasilar areas of atelectasis and/or consolidation, but with improving aeration in the left lung base, indicative of resolving airspace consolidation. 2. Aortic atherosclerosis. Electronically Signed   By: Trudie Reed M.D.   On: 04/30/2023 07:33   DG Chest Port 1 View  Result Date: 04/29/2023 CLINICAL DATA:  Shortness of breath EXAM: PORTABLE CHEST 1 VIEW COMPARISON:  04/27/2023 FINDINGS: Asymmetric elevation right hemidiaphragm. The cardio pericardial silhouette is enlarged. There is pulmonary vascular congestion without overt pulmonary edema. Left base collapse/consolidation with small to moderate left pleural effusion. IMPRESSION: 1. Interval progression of left base collapse/consolidation with small to moderate left pleural effusion. 2. Pulmonary vascular congestion without overt pulmonary edema. Electronically Signed   By: Kennith Center M.D.   On: 04/29/2023 08:50   ECHOCARDIOGRAM COMPLETE  Result Date: 04/28/2023    ECHOCARDIOGRAM REPORT   Patient Name:   Traeton EKAMJOT BONIFACE Date of Exam: 04/28/2023 Medical Rec #:  [id number]       Height:       70.0 in Accession #:    [id number]      Weight:       190.9 lb Date of Birth:  1941-01-26       BSA:          2.047 m Patient Age:    62 years        BP:           131/71 mmHg Patient Gender: M               HR:           79 bpm. Exam Location:  Inpatient Procedure: 2D Echo, Color Doppler, Cardiac Doppler  and Intracardiac            Opacification Agent Indications:    Pulmonary Hypertension  History:        Patient has prior history of Echocardiogram examinations, most                 recent 01/21/2022. NSTEMI; Risk Factors:Sleep Apnea, Dyslipidemia,  Hypertension and Diabetes.  Sonographer:    [facility] Referring Phys: [id number] VASUNDHRA RATHORE IMPRESSIONS  1. Left ventricular ejection fraction, by estimation, is 60 to 65%. The left ventricle has normal function. The left ventricle has no regional wall motion abnormalities. There is moderate left ventricular hypertrophy. Left ventricular diastolic parameters are consistent with Grade I diastolic dysfunction (impaired relaxation).  2. Right ventricular systolic function is mildly reduced. The right ventricular size is normal.  3. The mitral valve is normal in structure. No evidence of mitral valve regurgitation. No evidence of mitral stenosis.  4. The aortic valve is tricuspid. There is mild calcification of the aortic valve. There is mild thickening of the aortic valve. Aortic valve regurgitation is trivial. Aortic valve sclerosis is present, with no evidence of aortic valve stenosis.  5. Aortic dilatation noted. There is mild dilatation of the ascending aorta, measuring 41 mm.  6. The inferior vena cava is normal in size with greater than 50% respiratory variability, suggesting right atrial pressure of 3 mmHg. FINDINGS  Left Ventricle: Left ventricular ejection fraction, by estimation, is 60 to 65%. The left ventricle has normal function. The left ventricle has no regional wall motion abnormalities. Definity contrast agent was given IV to delineate the left ventricular  endocardial borders. The left ventricular internal cavity size was normal in size. There is moderate left ventricular hypertrophy. Left ventricular diastolic parameters are consistent with Grade I diastolic dysfunction (impaired relaxation). Right Ventricle: The right ventricular size is  normal. No increase in right ventricular wall thickness. Right ventricular systolic function is mildly reduced. Left Atrium: Left atrial size was normal in size. Right Atrium: Right atrial size was normal in size. Pericardium: There is no evidence of pericardial effusion. Mitral Valve: The mitral valve is normal in structure. No evidence of mitral valve regurgitation. No evidence of mitral valve stenosis. Tricuspid Valve: The tricuspid valve is normal in structure. Tricuspid valve regurgitation is not demonstrated. No evidence of tricuspid stenosis. Aortic Valve: The aortic valve is tricuspid. There is mild calcification of the aortic valve. There is mild thickening of the aortic valve. Aortic valve regurgitation is trivial. Aortic valve sclerosis is present, with no evidence of aortic valve stenosis. Aortic valve mean gradient measures 5.0 mmHg. Aortic valve peak gradient measures 9.9 mmHg. Aortic valve area, by VTI measures 2.86 cm. Pulmonic Valve: The pulmonic valve was normal in structure. Pulmonic valve regurgitation is not visualized. No evidence of pulmonic stenosis. Aorta: Aortic dilatation noted. There is mild dilatation of the ascending aorta, measuring 41 mm. Venous: The inferior vena cava is normal in size with greater than 50% respiratory variability, suggesting right atrial pressure of 3 mmHg. IAS/Shunts: The interatrial septum was not well visualized.  LEFT VENTRICLE PLAX 2D LVIDd:         4.40 cm   Diastology LVIDs:         3.00 cm   LV e' medial:    6.96 cm/s LV PW:         1.20 cm   LV E/e' medial:  10.6 LV IVS:        1.40 cm   LV e' lateral:   11.10 cm/s LVOT diam:     2.10 cm   LV E/e' lateral: 6.7 LV SV:         91 LV SV Index:   45 LVOT Area:     3.46 cm  RIGHT VENTRICLE RV Basal diam:  3.10 cm RV Mid diam:    3.40 cm RV  S prime:     14.80 cm/s TAPSE (M-mode): 1.9 cm LEFT ATRIUM             Index        RIGHT ATRIUM           Index LA diam:        3.80 cm 1.86 cm/m   RA Area:     18.80 cm  LA Vol (A2C):   49.3 ml 24.09 ml/m  RA Volume:   51.80 ml  25.31 ml/m LA Vol (A4C):   36.9 ml 18.03 ml/m LA Biplane Vol: 44.1 ml 21.55 ml/m  AORTIC VALVE AV Area (Vmax):    2.85 cm AV Area (Vmean):   2.91 cm AV Area (VTI):     2.86 cm AV Vmax:           157.00 cm/s AV Vmean:          101.000 cm/s AV VTI:            0.320 m AV Peak Grad:      9.9 mmHg AV Mean Grad:      5.0 mmHg LVOT Vmax:         129.00 cm/s LVOT Vmean:        84.900 cm/s LVOT VTI:          0.264 m LVOT/AV VTI ratio: 0.82  AORTA Ao Root diam: 4.10 cm Ao Asc diam:  4.10 cm MITRAL VALVE               TRICUSPID VALVE MV Area (PHT): 3.42 cm    TR Peak grad:   34.6 mmHg MV Decel Time: 222 msec    TR Vmax:        294.00 cm/s MV E velocity: 73.90 cm/s MV A velocity: 96.40 cm/s  SHUNTS MV E/A ratio:  0.77        Systemic VTI:  0.26 m                            Systemic Diam: 2.10 cm Charlton Haws MD Electronically signed by Charlton Haws MD Signature Date/Time: 04/28/2023/11:38:38 AM    Final    CT Angio Chest PE W and/or Wo Contrast  Result Date: 04/27/2023 CLINICAL DATA:  Shortness of breath and weakness EXAM: CT ANGIOGRAPHY CHEST WITH CONTRAST TECHNIQUE: Multidetector CT imaging of the chest was performed using the standard protocol during bolus administration of intravenous contrast. Multiplanar CT image reconstructions and MIPs were obtained to evaluate the vascular anatomy. RADIATION DOSE REDUCTION: This exam was performed according to the departmental dose-optimization program which includes automated exposure control, adjustment of the mA and/or kV according to patient size and/or use of iterative reconstruction technique. CONTRAST:  75mL OMNIPAQUE IOHEXOL 350 MG/ML SOLN COMPARISON:  Plain film of earlier today.  CTA chest 01/27/2022. FINDINGS: Cardiovascular: The quality of this exam for evaluation of pulmonary embolism is moderate. The bolus is centered in the [facility] to the and there is minimal motion degradation. No evidence of pulmonary  embolism. Aortic atherosclerosis. Mild ascending aortic dilatation including at 4.2 cm, similar. Tortuous descending thoracic aorta. Mild cardiomegaly, without pericardial effusion. Lad and right coronary artery calcification. Pulmonary artery enlargement, outflow tract 3.7 cm. Mediastinum/Nodes: No mediastinal or hilar adenopathy. Lungs/Pleura: Small right pleural effusion, minimally increased. Moderate right hemidiaphragm elevation. VP shunt catheter again identified about the right chest wall, terminating in the right pleural space inferiorly. Right greater than left base volume loss  and airspace disease, all favored to represent atelectasis and scar. Upper Abdomen: small dependent gallstones. Normal imaged portions of the liver, spleen, stomach, pancreas, adrenal glands. Musculoskeletal: No acute osseous abnormality. Review of the MIP images confirms the above findings. IMPRESSION: 1. No pulmonary embolism with above limitations. 2. Moderate to marked right hemidiaphragm elevation with persistent small right pleural effusion and similar bibasilar volume loss with atelectasis and scar. 3. Similar mild ascending aortic dilatation at 4.2 cm. Recommend annual imaging followup by CTA or MRA. This recommendation follows 2010 ACCF/AHA/AATS/ACR/ASA/SCA/SCAI/SIR/STS/SVM Guidelines for the Diagnosis and Management of Patients with Thoracic Aortic Disease. Circulation. 2010; 121: W098-J191. Aortic aneurysm NOS (ICD10-I71.9) 4. Pulmonary artery enlargement suggests pulmonary arterial hypertension. 5. Cholelithiasis 6. Aortic Atherosclerosis (ICD10-I70.0). Coronary artery atherosclerosis. Electronically Signed   By: Jeronimo Greaves M.D.   On: 04/27/2023 17:34   DG Chest Port 1 View  Result Date: 04/27/2023 CLINICAL DATA:  Shortness of breath.  Fatigue. EXAM: PORTABLE CHEST 1 VIEW COMPARISON:  01/27/2022. FINDINGS: Low lung volume. There are probable atelectatic changes/scarring at the lung bases. No acute consolidation or  lung collapse. Apparent blunting of bilateral lateral costophrenic angles may be due to trace pleural effusion versus superimposed soft tissue. No pneumothorax. Stable cardio-mediastinal silhouette. No acute osseous abnormalities. The soft tissues are within normal limits. Presumed VP shunt tubing noted along the right lateral chest. IMPRESSION: *Bibasilar atelectasis/scarring. No acute consolidation or lung collapse. Electronically Signed   By: Jules Schick M.D.   On: 04/27/2023 14:24      Signature  -   Susa Raring M.D on 05/01/2023 at 9:54 AM   -  To page go to [email or web address]

## 2023-05-01 NOTE — Progress Notes (Signed)
Mobility Specialist Progress Note;   05/01/23 1120  Mobility  Activity Ambulated with assistance in hallway  Level of Assistance Contact guard assist, steadying assist  Assistive Device None  Distance Ambulated (ft) 290 ft  Activity Response Tolerated well  Mobility Referral Yes  Mobility visit 1 Mobility  Mobility Specialist Start Time (ACUTE ONLY) 1120  Mobility Specialist Stop Time (ACUTE ONLY) 1140  Mobility Specialist Time Calculation (min) (ACUTE ONLY) 20 min    Pre-mobility: SPO2 92% 8LO2 During-mobility: SPO2 89-93% 8LO2 Post-mobility: SPO2 94% 8LO2  Pt eager for mobility. Required MinG assistance during ambulation for safety. On 8LO2 upon arrival and ambulated on 8LO2, VSS throughout. Asked for assistance to BR at EOS, void successful. Pt returned back to chair with all needs met, on 8LO2. Alarm on.   Caesar Bookman Mobility Specialist Please contact via SecureChat or Rehab Office [phone]

## 2023-05-01 NOTE — TOC CM/SW Note (Signed)
Transition of Care [facility]) - Inpatient Brief Assessment   Patient Details  Name: Jeremiah [name] MRN: [id number] Date of Birth: 1941/02/03  [facility] of [facility]) CM/SW Contact:    Mearl Latin, LCSW Phone Number: 05/01/2023, 2:47 PM   Clinical Narrative: Patient admitted from home with spouse and their caregiver three days a week. He is on home oxygen. TOC following to set up home health services.     Transition of Care Asessment: Insurance and Status: Insurance coverage has been reviewed Patient has primary care physician: Yes Home environment has been reviewed: From home Prior level of function:: Needs assist Prior/Current Home Services: Current home services Social Determinants of Health Reivew: SDOH reviewed no interventions necessary Readmission risk has been reviewed: Yes Transition of care needs: transition of care needs identified, TOC will continue to follow

## 2023-05-01 NOTE — Plan of Care (Signed)

## 2023-05-01 NOTE — Interval H&P Note (Signed)
History and Physical Interval Note:  05/01/2023 6:20 PM  Jeremiah [name]  has presented today for surgery, with the diagnosis of hp.  The various methods of treatment have been discussed with the patient and family. After consideration of risks, benefits and other options for treatment, the patient has consented to  Procedure(s): RIGHT HEART CATH (N/A) as a surgical intervention.  The patient's history has been reviewed, patient examined, no change in status, stable for surgery.  I have reviewed the patient's chart and labs.  Questions were answered to the patient's satisfaction.     Bryan Lemma

## 2023-05-01 NOTE — Progress Notes (Signed)
  Patient's nurse reported that he came back to the floor after the heart cath.  Heart healthy/carb modified diet has been resumed with fluid restriction 1.8 L/day and salt restriction 2 g/day.

## 2023-05-02 ENCOUNTER — Telehealth (HOSPITAL_COMMUNITY): Payer: Self-pay | Admitting: Pharmacy Technician

## 2023-05-02 ENCOUNTER — Other Ambulatory Visit (HOSPITAL_COMMUNITY): Payer: Self-pay

## 2023-05-02 DIAGNOSIS — G4733 Obstructive sleep apnea (adult) (pediatric): Secondary | ICD-10-CM

## 2023-05-02 DIAGNOSIS — J9621 Acute and chronic respiratory failure with hypoxia: Secondary | ICD-10-CM | POA: Diagnosis not present

## 2023-05-02 LAB — GLUCOSE, CAPILLARY
Glucose-Capillary: 165 mg/dL — ABNORMAL HIGH (ref 70–99)
Glucose-Capillary: 106 mg/dL — ABNORMAL HIGH (ref 70–99)
Glucose-Capillary: 158 mg/dL — ABNORMAL HIGH (ref 70–99)
Glucose-Capillary: 241 mg/dL — ABNORMAL HIGH (ref 70–99)
Glucose-Capillary: 227 mg/dL — ABNORMAL HIGH (ref 70–99)

## 2023-05-02 LAB — CBC WITH DIFFERENTIAL/PLATELET
Abs Immature Granulocytes: 0.05 10*3/uL (ref 0.00–0.07)
Basophils Absolute: 0 10*3/uL (ref 0.0–0.1)
Basophils Relative: 0 %
Eosinophils Absolute: 0 10*3/uL (ref 0.0–0.5)
Eosinophils Relative: 0 %
HCT: 46 % (ref 39.0–52.0)
Hemoglobin: 14.6 g/dL (ref 13.0–17.0)
Immature Granulocytes: 1 %
Lymphocytes Relative: 3 %
Lymphs Abs: 0.3 10*3/uL — ABNORMAL LOW (ref 0.7–4.0)
MCH: 31.5 pg (ref 26.0–34.0)
MCHC: 31.7 g/dL (ref 30.0–36.0)
MCV: 99.4 fL (ref 80.0–100.0)
Monocytes Absolute: 0.7 10*3/uL (ref 0.1–1.0)
Monocytes Relative: 7 %
Neutro Abs: 7.9 10*3/uL — ABNORMAL HIGH (ref 1.7–7.7)
Neutrophils Relative %: 89 %
Platelets: 144 10*3/uL — ABNORMAL LOW (ref 150–400)
RBC: 4.63 MIL/uL (ref 4.22–5.81)
RDW: 13.9 % (ref 11.5–15.5)
WBC: 8.9 10*3/uL (ref 4.0–10.5)
nRBC: 0 % (ref 0.0–0.2)

## 2023-05-02 LAB — BRAIN NATRIURETIC PEPTIDE: B Natriuretic Peptide: 57.5 pg/mL (ref 0.0–100.0)

## 2023-05-02 LAB — UREA NITROGEN, URINE: Urea Nitrogen, Ur: 1112 mg/dL

## 2023-05-02 LAB — PROCALCITONIN: Procalcitonin: <0.1 ng/mL

## 2023-05-02 LAB — BASIC METABOLIC PANEL
Anion gap: 4 — ABNORMAL LOW (ref 5–15)
BUN: 42 mg/dL — ABNORMAL HIGH (ref 8–23)
CO2: 36 mmol/L — ABNORMAL HIGH (ref 22–32)
Calcium: 8.8 mg/dL — ABNORMAL LOW (ref 8.9–10.3)
Chloride: 99 mmol/L (ref 98–111)
Creatinine, Ser: 1.34 mg/dL — ABNORMAL HIGH (ref 0.61–1.24)
GFR, Estimated: 53 mL/min — ABNORMAL LOW (ref >60)
Glucose, Bld: 218 mg/dL — ABNORMAL HIGH (ref 70–99)
Potassium: 4.3 mmol/L (ref 3.5–5.1)
Sodium: 139 mmol/L (ref 135–145)

## 2023-05-02 LAB — MAGNESIUM: Magnesium: 2.1 mg/dL (ref 1.7–2.4)

## 2023-05-02 LAB — C-REACTIVE PROTEIN: CRP: 1.1 mg/dL — ABNORMAL HIGH (ref <1.0)

## 2023-05-02 NOTE — Progress Notes (Signed)
Occupational Therapy Treatment Patient Details Name: Jeremiah [name] MRN: [id number] DOB: [birth year] Today's Date: 05/02/2023   History of present illness The pt is an 82 yo male presenting 12/6 with SOB and weakness x3 weeks. Admitted for acute on chronic resp failure with hypoxia. PMH: CAD status post PCI, chronic respiratory failure on O2 2 L,  NPH s/p VP shunt, diabetes mellitus type 2, HLP, HTN, BPH, CKD stage IIIa, OSA on CPAP, prostate CA, skin CA.   OT comments  Pt progressing towards acute OT goals. Focus of session was energy conservation strategies for ADL management, provided corresponding handout. D/c recs remain the same.       If plan is discharge home, recommend the following:  A little help with walking and/or transfers;A little help with bathing/dressing/bathroom   Equipment Recommendations  None recommended by OT    Recommendations for Other Services      Precautions / Restrictions Precautions Precautions: Fall Precaution Comments: watch O2 Restrictions Weight Bearing Restrictions: No       Mobility Bed Mobility               General bed mobility comments: not assessed    Transfers                   General transfer comment: not assessed this session. pt recently up with mobility tech, walking in the hall     Balance                                           ADL either performed or assessed with clinical judgement   ADL Overall ADL's : Needs assistance/impaired                                       General ADL Comments: Provided education including handout on energy conservation as pertains to ADL management at home. Pt recently walked with mobility tech in the hall, tolerated well per pt report.    Extremity/Trunk Assessment Upper Extremity Assessment Upper Extremity Assessment: Overall WFL for tasks assessed   Lower Extremity Assessment Lower Extremity Assessment: Defer to PT evaluation         Vision       Perception     Praxis      Cognition Arousal: Alert Behavior During Therapy: WFL for tasks assessed/performed Overall Cognitive Status: Within Functional Limits for tasks assessed                                          Exercises      Shoulder Instructions       General Comments      Pertinent Vitals/ Pain       Pain Assessment Pain Assessment: Faces Faces Pain Scale: Hurts a little bit Pain Location: unspecified Pain Descriptors / Indicators: Discomfort Pain Intervention(s): Monitored during session  Home Living                                          Prior Functioning/Environment              Frequency  Min 2X/week        Progress Toward Goals  OT Goals(current goals can now be found in the care plan section)  Progress towards OT goals: Progressing toward goals  Acute Rehab OT Goals Patient Stated Goal: home, improve standing balance OT Goal Formulation: With patient Time For Goal Achievement: 05/13/23 Potential to Achieve Goals: Good ADL Goals Pt Will Perform Grooming: with modified independence;standing Pt Will Perform Lower Body Bathing: with modified independence;sit to/from stand Pt Will Perform Lower Body Dressing: with modified independence;sit to/from stand Pt Will Transfer to Toilet: with modified independence;ambulating Pt Will Perform Toileting - Clothing Manipulation and hygiene: with modified independence;sit to/from stand Pt Will Perform Tub/Shower Transfer: Shower transfer;with modified independence;ambulating;rolling walker  Plan      Co-evaluation                 AM-PAC OT "6 Clicks" Daily Activity     Outcome Measure   Help from another person eating meals?: None Help from another person taking care of personal grooming?: None Help from another person toileting, which includes using toliet, bedpan, or urinal?: A Little Help from another person bathing  (including washing, rinsing, drying)?: A Little Help from another person to put on and taking off regular upper body clothing?: None Help from another person to put on and taking off regular lower body clothing?: A Little 6 Click Score: 21    End of Session Equipment Utilized During Treatment: Oxygen  OT Visit Diagnosis: Unsteadiness on feet (R26.81);Muscle weakness (generalized) (M62.81)   Activity Tolerance Patient tolerated treatment well   Patient Left in bed;with call bell/phone within reach (did not alter bed alarm setting)   Nurse Communication          Time: 8295-6213 OT Time Calculation (min): 8 min  Charges: OT General Charges $OT Visit: 1 Visit OT Treatments $Self Care/Home Management : 8-22 mins  Raynald Kemp, OT Acute Rehabilitation Services Office: [phone]   Pilar Grammes 05/02/2023, 11:12 AM

## 2023-05-02 NOTE — Progress Notes (Signed)
   We were consulted to help arrange right heart catheterization at the request of Pulmonology. RHC on 12/10 showed findings consistent with mild pulmonary hypertension. He appeared adequately diuresed. Discussed with Dr. [place] - we have no new recommendations. Management per Pulmonology and [facility]. Cardiology will sign off.   Corrin Parker, PA-C 05/02/2023 4:47 PM

## 2023-05-02 NOTE — Progress Notes (Signed)
NAME:  Jeremiah [name], MRN:  [id number], DOB:  [birth year], LOS: 4 ADMISSION DATE:  04/27/2023, CONSULTATION DATE:  04/30/2023 REFERRING MD:  Thedore Mins, CHIEF COMPLAINT:  sob  History of Present Illness:  82 year old male with CAD status post PCI, chronic respiratory failure on O2 2 L - Pulm HTN,  NPH s/p VP shunt, diabetes mellitus type 2, HLP, HTN, BPH, CKD stage IIIa, OSA on CPAP, prostate CA, skin CA presented to ED 04/27/23 with shortness of breath and hypoxia.  Patient has not been using his home O2.  Caregiver went over to his house and noted that his O2 sats were in the 70s on room air and cyanotic lips.  When EMS arrived, he was 80% on room air and was placed on 3 L O2 via [place], improved sats to mid 90s.  In ED, patient was noted to be still hypoxic and was placed on 6 L O2 via [place]. Per patient, he has been feeling short of breath for last 1 year, has been on home O2 2 L but does not use it regularly.  Patient is also noncompliant with CPAP.  Lives at home with his wife who has dementia and they have a caregiver who visits 3 times a week.  No chest pain, cough or fevers. Today in  NAD at rest, tachypneic, afebrile, no leukocytosis, troponins negative, creatinine 1.2, BNP 116.  CTA chest negative for PE, moderate to marked right hemidiaphragm elevation with persistent small right pleural effusion, similar bibasilar volume loss, pulmonary arterial hypertension.  Pulm consult 12/9 for continued dyspnea with everything ruled out.  Pertinent  Medical History  CAD status post PCI, chronic respiratory failure on O2 2 L - Pulm HTN,  NPH s/p VP shunt, diabetes mellitus type 2, HLP, HTN, BPH, CKD stage IIIa, OSA on CPAP, prostate CA, skin CA presented to ED with shortness of breath and hypoxia. Non compliant with oxygen and CPAP  Significant Hospital Events: Including procedures, antibiotic start and stop dates in addition to other pertinent events   ER on 12/6, Admission 12/7  Interim History /  Subjective:  Patient just walked around w/ PT in hallways Down to 4L [place] sats 95%  Objective   Blood pressure (!) 110/90, pulse 81, temperature (!) 97.3 F (36.3 C), temperature source Axillary, resp. rate 20, height 5\' 10"  (1.778 m), weight 86.6 kg, SpO2 95%.        Intake/Output Summary (Last 24 hours) at 05/02/2023 0919 Last data filed at 05/02/2023 0504 Gross per 24 hour  Intake --  Output 850 ml  Net -850 ml   Filed Weights   04/27/23 1148  Weight: 86.6 kg    Examination: General:  NAD HEENT: MM pink/moist; [place] in place Neuro: Aox3; MAE CV: s1s2, RRR, no m/r/g PULM:  dim clear BS bilaterally; Eleva 4L GI: soft, bsx4 active    Resolved Hospital Problem list     Assessment & Plan:  Acute on chronic hypoxic & hypercapnic respiratory failure Noncompliant with  home oxygen & CPAP CTA with pulmonary artery enlargement suspicious for chronic PH-- likely WHO group II & III. RV mildly reduced function on echo. Concern for shunt physiology through RLL-- has enlarged, well perfused vessels going through it> unsure if this is acute or chronic. Appears more consolidated on this CT compared to 2023.  Acute pulmonary edema is likely with elevated BNP Plan: -wean  for sats >90% -diuresis per cards -cont pulm toiletry: IS, flutter -oob, pt, ot -cont cpap at bedtime  Labs   CBC: Recent Labs  Lab 04/27/23 1156 04/29/23 0546 04/30/23 0457 05/01/23 0435 05/01/23 1854 05/02/23 0510  WBC 7.5 10.6* 10.4 7.5  --  8.9  NEUTROABS  --  9.8* 9.4* 7.1  --  7.9*  HGB 15.4 14.3 14.9 14.7 15.3  15.6 14.6  HCT 49.3 46.5 48.9 47.8 45.0  46.0 46.0  MCV 103.6* 103.3* 104.7* 101.9*  --  99.4  PLT 159 134* 143* 145*  --  144*    Basic Metabolic Panel: Recent Labs  Lab 04/28/23 0446 04/29/23 0546 04/30/23 0457 05/01/23 0435 05/01/23 1854 05/02/23 0510  NA 148* 147* 141 141 142  140 139  K 3.8 4.3 4.5 4.9 4.3  4.5 4.3  CL 103 105 97* 99  --  99  CO2 35* 31 36* 36*  --   36*  GLUCOSE 134* 203* 193* 207*  --  218*  BUN 22 39* 53* 46*  --  42*  CREATININE 1.32* 1.53* 2.14* 1.46*  --  1.34*  CALCIUM 8.7* 8.8* 8.5* 8.7*  --  8.8*  MG  --  2.0 2.2 2.1  --  2.1   GFR: Estimated Creatinine Clearance: 43.1 mL/min (A) (by C-G formula based on SCr of 1.34 mg/dL (H)). Recent Labs  Lab 04/29/23 0546 04/30/23 0457 05/01/23 0435 05/02/23 0510  PROCALCITON <0.10 <0.10 <0.10 <0.10  WBC 10.6* 10.4 7.5 8.9    JD Anselm Lis [facility] 05/02/2023, 9:25 AM  Please see [email or web address] for pager details.  From 7A-7P if no response, please call [phone]. After hours, please call ELink [phone].

## 2023-05-02 NOTE — Plan of Care (Signed)

## 2023-05-02 NOTE — Progress Notes (Signed)
Mobility Specialist Progress Note:   05/02/23 0915  Mobility  Activity Ambulated with assistance in hallway  Level of Assistance Contact guard assist, steadying assist  Assistive Device None  Distance Ambulated (ft) 500 ft  Activity Response Tolerated well  Mobility Referral Yes  Mobility visit 1 Mobility  Mobility Specialist Start Time (ACUTE ONLY) 0915  Mobility Specialist Stop Time (ACUTE ONLY) [id number]  Mobility Specialist Time Calculation (min) (ACUTE ONLY) 13 min   Pt agreeable to mobility session. Required steadying assist intermittently throughout ambulation. No AD use needed. 4LO2 required to maintain SpO2 WFL (91%). Pt back in bed with all needs met, left on 4LO2.   Addison Lank Mobility Specialist Please contact via SecureChat or  Rehab office at [phone]

## 2023-05-02 NOTE — Telephone Encounter (Signed)
Patient Product/process development scientist completed.    The patient is insured through [facility]. Patient has Medicare and is not eligible for a copay card, but may be able to apply for patient assistance, if available.    Ran test claim for Farxiga 10 mg and the current 30 day co-pay is $138.08 due to being in Coverage Gap (donut hole).  Ran test claim for Jardiance 10 mg and the current 30 day co-pay is $144.91 due to being in Coverage Gap (donut hole).  This test claim was processed through [facility]- copay amounts may vary at other pharmacies due to pharmacy/plan contracts, or as the patient moves through the different stages of their insurance plan.     Roland Earl, CPHT Pharmacy Technician III Certified Patient Advocate [facility] Patient Advocate Team Direct Number: [phone]  Fax: [phone]

## 2023-05-02 NOTE — Progress Notes (Signed)
Physical Therapy Treatment Patient Details Name: Jeremiah [name] MRN: [id number] DOB: April 21, 1941 Today's Date: 05/02/2023   History of Present Illness The pt is an 82 yo male presenting 12/6 with SOB and weakness x3 weeks. Admitted for acute on chronic resp failure with hypoxia. PMH: CAD status post PCI, chronic respiratory failure on O2 2 L,  NPH s/p VP shunt, diabetes mellitus type 2, HLP, HTN, BPH, CKD stage IIIa, OSA on CPAP, prostate CA, skin CA.    PT Comments  Pt tolerated treatment well today. Pt able to ambulate in hallway and navigate stairs with CGA. VSS on 4L. Pt met all acute PT goals and will be signing off. No change in DC/DME recs at this time. Re consult PT if mobility status changes. Pt would benefit from continued mobility with mobility specialist during acute stay.    If plan is discharge home, recommend the following: A little help with walking and/or transfers;Assistance with cooking/housework;Assist for transportation;Help with stairs or ramp for entrance   Can travel by private vehicle        Equipment Recommendations  None recommended by PT    Recommendations for Other Services       Precautions / Restrictions Precautions Precautions: Fall Precaution Comments: watch O2 Restrictions Weight Bearing Restrictions: No     Mobility  Bed Mobility Overal bed mobility: Modified Independent                  Transfers Overall transfer level: Needs assistance Equipment used: None Transfers: Sit to/from Stand Sit to Stand: Supervision                Ambulation/Gait Ambulation/Gait assistance: Contact guard assist Gait Distance (Feet): 200 Feet Assistive device: None Gait Pattern/deviations: Step-through pattern Gait velocity: decreased     General Gait Details: slight trunk flexed, slowed gait with slight lateral sway. no overt LOB. SpO2 around 91% on 4L.   Stairs Stairs: Yes Stairs assistance: Supervision Stair Management: One rail  Left, Alternating pattern, Forwards, Backwards Number of Stairs: 2 General stair comments: no LOB noted.   Wheelchair Mobility     Tilt Bed    Modified Rankin (Stroke Patients Only)       Balance Overall balance assessment: Needs assistance Sitting-balance support: No upper extremity supported, Feet supported Sitting balance-Leahy Scale: Good     Standing balance support: No upper extremity supported Standing balance-Leahy Scale: Good                              Cognition Arousal: Alert Behavior During Therapy: WFL for tasks assessed/performed Overall Cognitive Status: Within Functional Limits for tasks assessed                                          Exercises      General Comments General comments (skin integrity, edema, etc.): VSS on 4L      Pertinent Vitals/Pain Pain Assessment Pain Assessment: Faces Faces Pain Scale: Hurts a little bit Pain Location: unspecified Pain Descriptors / Indicators: Discomfort Pain Intervention(s): Monitored during session    Home Living                          Prior Function            PT Goals (current goals can now be  found in the care plan section) Progress towards PT goals: Goals met/education completed, patient discharged from PT    Frequency    Min 1X/week      PT Plan      Co-evaluation              AM-PAC PT "6 Clicks" Mobility   Outcome Measure  Help needed turning from your back to your side while in a flat bed without using bedrails?: None Help needed moving from lying on your back to sitting on the side of a flat bed without using bedrails?: A Little Help needed moving to and from a bed to a chair (including a wheelchair)?: A Little Help needed standing up from a chair using your arms (e.g., wheelchair or bedside chair)?: A Little Help needed to walk in hospital room?: A Little Help needed climbing 3-5 steps with a railing? : A Little 6 Click  Score: 19    End of Session Equipment Utilized During Treatment: Gait belt;Oxygen Activity Tolerance: Patient tolerated treatment well Patient left: in bed;with call bell/phone within reach Nurse Communication: Mobility status PT Visit Diagnosis: Unsteadiness on feet (R26.81);Other abnormalities of gait and mobility (R26.89)     Time: 8295-6213 PT Time Calculation (min) (ACUTE ONLY): 12 min  Charges:    $Gait Training: 8-22 mins PT General Charges $$ ACUTE PT VISIT: 1 Visit                     Shela Nevin, PT, DPT Acute Rehab Services [phone]    Gladys Damme 05/02/2023, 4:21 PM

## 2023-05-02 NOTE — Progress Notes (Signed)
PROGRESS NOTE                                                                                                                                                                                                             Patient Demographics:    Jeremiah [name], is a 82 y.o. male, DOB - [birth year], WGN:[id number]  Outpatient Primary MD for the patient is Rodrigo Ran, MD    LOS - 4  Admit date - 04/27/2023    Chief Complaint  Patient presents with   Shortness of Breath   Weakness       Brief Narrative (HPI from H&P)    82 year old male with CAD status post PCI, chronic respiratory failure on O2 2 L - Pulm HTN,  NPH s/p VP shunt, diabetes mellitus type 2, HLP, HTN, BPH, CKD stage IIIa, OSA on CPAP, prostate CA, skin CA presented to ED with shortness of breath and hypoxia.  Patient has not been using his home O2.  Caregiver went over to his house and noted that his O2 sats were in the 70s on room air and cyanotic lips.  When EMS arrived, he was 80% on room air and was placed on 3 L O2 via [place], improved sats to mid 90s.  In ED, patient was noted to be still hypoxic and was placed on 6 L O2 via [place]. Per patient, he has been feeling short of breath for last 1 year, has been on home O2 2 L but does not use it regularly.  Patient is also noncompliant with CPAP.  Lives at home with his wife who has dementia and they have a caregiver who visits 3 times a week.  No chest pain, cough or fevers.   NAD, tachypneic, afebrile, no leukocytosis, troponins negative, creatinine 1.2, BNP 116.  CTA chest negative for PE, moderate to marked right hemidiaphragm elevation with persistent small right pleural effusion, similar bibasilar volume loss, pulmonary arterial hypertension.   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  I-S and flutter valve for pulmonary toiletry.  SpO2: 90 % O2 Flow Rate (L/min): 8 L/min      Mild ascending aortic dilation  - CT showing similar mild ascending aortic dilation at 4.2 cm.  Recommended annual imaging follow-up with CTA or MRA.  Beta-blocker for now.  Mild hypernatremia.   -Resolved with some gentle IV fluids.  AKI on top of CKD stage IIIa  - after IV Lasix, Lasix had to be held improved after gentle hydration with IV fluids on 04/29/2023.   OSA Continue nightly CPAP.   CAD status post PCI in September 2023, HTN, hyperlipidemia -Currently no acute chest pain, on Coreg, aspirin, Zetia, DAPT and statin for secondary prevention continue.   Obesity Estimated body mass index is 27.39 kg/m, follow-up with PCP.  Diabetes mellitus type 2, uncontrolled with hyperglycemia  - Hemoglobin A1c 7.7, ISS add Semglee as sugars are high due to steroid use  Lab Results  Component Value Date   HGBA1C 7.7 (H) 04/28/2023   CBG (last 3)  Recent Labs    05/01/23 2118 05/02/23 0804 05/02/23 1203  GLUCAP 298* 165* 106*        Condition -  Guarded  Family Communication  :none at bedside  Code Status :  FULL  Consults  : Pulmonary, Cards  PUD Prophylaxis : PPI   Procedures  :     CT - 1. No pulmonary embolism with above limitations. 2. Moderate to marked  right hemidiaphragm elevation with persistent small right pleural effusion and similar bibasilar volume loss with atelectasis and scar. 3. Similar mild ascending aortic dilatation at 4.2 cm. Recommend annual imaging followup by CTA or MRA. 4. Pulmonary artery enlargement suggests pulmonary arterial hypertension. 5. Cholelithiasis 6. Aortic Atherosclerosis (ICD10-I70.0). Coronary artery atherosclerosis.  TTE - 1. Left ventricular ejection fraction, by estimation, is 60 to 65%. The left ventricle has normal function. The left ventricle has no regional wall motion abnormalities. There is moderate left ventricular hypertrophy. Left ventricular diastolic parameters are consistent with Grade I diastolic dysfunction (impaired relaxation).  2. Right ventricular systolic function is mildly reduced. The right ventricular size is normal.  3. The mitral valve is normal in structure. No evidence of mitral valve regurgitation. No evidence of mitral stenosis.  4. The aortic valve is tricuspid. There is mild calcification of the aortic valve. There is mild thickening of the aortic valve. Aortic valve regurgitation is trivial. Aortic valve sclerosis is present, with no evidence of aortic valve stenosis.  5. Aortic dilatation noted. There is mild dilatation of the ascending aorta, measuring 41 mm.  6. The inferior vena cava is normal in size with greater than 50% respiratory variability, suggesting right atrial pressure of 3 mmHg.      Disposition Plan  :    Status is: Inpatient  DVT Prophylaxis  :    enoxaparin (LOVENOX) injection 40 mg Start: 05/02/23 1000   Lab Results  Component Value Date   PLT 144 (L) 05/02/2023    Diet :  Diet Order             Diet heart healthy/carb modified Room service appropriate? Yes; Fluid consistency: Thin;  Fluid restriction: 1800 mL Fluid  Diet effective now                    Inpatient Medications  Scheduled Meds:  amoxicillin-clavulanate  1 tablet Oral BID    arformoterol  15 mcg Nebulization BID   aspirin EC  81 mg Oral Daily   budesonide (PULMICORT) nebulizer solution  0.25 mg Nebulization BID   carvedilol  3.125 mg Oral BID   cholecalciferol  2,000 Units Oral Daily   clopidogrel  75 mg Oral Q breakfast   vitamin B-12  500 mcg Oral QODAY   And   vitamin B-12  1,000 mcg Oral QODAY   enoxaparin (LOVENOX) injection  40 mg Subcutaneous Q24H   ezetimibe  10 mg Oral Daily   insulin aspart  0-5 Units Subcutaneous QHS   insulin aspart  0-9 Units Subcutaneous TID WC   insulin glargine-yfgn  12 Units Subcutaneous Daily   ipratropium-albuterol  3 mL Nebulization BID   methylPREDNISolone (SOLU-MEDROL) injection  30 mg Intravenous Q24H   pantoprazole  40 mg Oral Q0600   pneumococcal 20-valent conjugate vaccine  0.5 mL Intramuscular Tomorrow-1000   rosuvastatin  10 mg Oral Daily   Continuous Infusions:   PRN Meds:.acetaminophen **OR** acetaminophen  Antibiotics  :    Anti-infectives (From admission, onward)    Start     Dose/Rate Route Frequency Ordered Stop   04/30/23 2200  amoxicillin-clavulanate (AUGMENTIN) 500-125 MG per tablet 1 tablet        1 tablet Oral 2 times daily 04/30/23 1919     04/28/23 1115  amoxicillin-clavulanate (AUGMENTIN) 875-125 MG per tablet 1 tablet  Status:  Discontinued        1 tablet Oral Every 12 hours 04/28/23 1105 04/30/23 1919         Objective:   Vitals:   05/02/23 0804 05/02/23 0808 05/02/23 1205 05/02/23 1206  BP:  (!) 110/90 123/84   Pulse:  81 76 77  Resp:  20 (!) 28 20  Temp:  (!) 97.3 F (36.3 C) 98.4 F (36.9 C)   TempSrc:  Axillary Oral   SpO2: 93% 95% 92% 90%  Weight:      Height:        Wt Readings from Last 3 Encounters:  04/27/23 86.6 kg  01/17/23 86.6 kg  07/19/22 85.3 kg     Intake/Output Summary (Last 24 hours) at 05/02/2023 1459 Last data filed at 05/02/2023 0504 Gross per 24 hour  Intake --  Output 650 ml  Net -650 ml     Physical Exam  Awake Alert, Oriented X  3, No new F.N deficits, Normal affect Symmetrical Chest wall movement, diminished at the bases RRR,No Gallops,Rubs or new Murmurs, No Parasternal Heave +ve B.Sounds, Abd Soft, No tenderness, No rebound - guarding or rigidity. No Cyanosis, Clubbing or edema, No new Rash or bruise        Data Review:    Recent Labs  Lab 04/27/23 1156 04/29/23 0546 04/30/23 0457 05/01/23 0435 05/01/23 1854 05/02/23 0510  WBC 7.5 10.6* 10.4 7.5  --  8.9  HGB 15.4 14.3 14.9 14.7 15.3  15.6 14.6  HCT 49.3 46.5 48.9 47.8 45.0  46.0 46.0  PLT 159 134* 143* 145*  --  144*  MCV 103.6* 103.3* 104.7* 101.9*  --  99.4  MCH 32.4 31.8 31.9 31.3  --  31.5  MCHC 31.2 30.8 30.5 30.8  --  31.7  RDW 14.8 14.6 14.4 14.3  --  13.9  LYMPHSABS  --  0.3* 0.4* 0.3*  --  0.3*  MONOABS  --  0.4 0.6 0.1  --  0.7  EOSABS  --  0.0 0.0 0.0  --  0.0  BASOSABS  --  0.0 0.0 0.0  --  0.0    Recent Labs  Lab 04/27/23 1155 04/27/23 1327 04/27/23 1327 04/28/23 0446 04/29/23 0546 04/29/23 0715 04/30/23 0457 05/01/23 0435 05/01/23 1854 05/02/23 0510  NA  --  148*   < > 148* 147*  --  141 141 142  140 139  K  --  4.5   < > 3.8 4.3  --  4.5 4.9 4.3  4.5 4.3  CL  --  103   < > 103 105  --  97* 99  --  99  CO2  --  37*   < > 35* 31  --  36* 36*  --  36*  ANIONGAP  --  8   < > 10 11  --  8 6  --  4*  GLUCOSE  --  179*   < > 134* 203*  --  193* 207*  --  218*  BUN  --  22   < > 22 39*  --  53* 46*  --  42*  CREATININE  --  1.21   < > 1.32* 1.53*  --  2.14* 1.46*  --  1.34*  AST  --  14*  --   --   --   --   --   --   --   --   ALT  --  9  --   --   --   --   --   --   --   --   ALKPHOS  --  54  --   --   --   --   --   --   --   --   BILITOT  --  1.2*  --   --   --   --   --   --   --   --   ALBUMIN  --  3.3*  --   --   --   --   --   --   --   --   CRP  --   --   --   --   --  7.6* 4.6* 2.3*  --  1.1*  PROCALCITON  --   --   --   --  <0.10  --  <0.10 <0.10  --  <0.10  HGBA1C  --   --   --  7.7*  --   --   --   --    --   --   BNP 116.9*  --   --   --  114.8*  --  53.1 290.8*  --  57.5  MG  --   --   --   --  2.0  --  2.2 2.1  --  2.1  CALCIUM  --  8.8*   < > 8.7* 8.8*  --  8.5* 8.7*  --  8.8*   < > = values in this interval not displayed.      Recent Labs  Lab 04/27/23 1155 04/27/23 1327 04/28/23 0446 04/29/23 0546 04/29/23 0715 04/30/23 0457 05/01/23 0435 05/02/23 0510  CRP  --   --   --   --  7.6* 4.6* 2.3* 1.1*  PROCALCITON  --   --   --  <  0.10  --  <0.10 <0.10 <0.10  HGBA1C  --   --  7.7*  --   --   --   --   --   BNP 116.9*  --   --  114.8*  --  53.1 290.8* 57.5  MG  --   --   --  2.0  --  2.2 2.1 2.1  CALCIUM  --    < > 8.7* 8.8*  --  8.5* 8.7* 8.8*   < > = values in this interval not displayed.    --------------------------------------------------------------------------------------------------------------- Lab Results  Component Value Date   CHOL 140 03/14/2022   HDL 44 03/14/2022   LDLCALC 66 03/14/2022   TRIG 181 (H) 03/14/2022   CHOLHDL 3.2 03/14/2022    Lab Results  Component Value Date   HGBA1C 7.7 (H) 04/28/2023   Radiology Reports CARDIAC CATHETERIZATION  Result Date: 05/01/2023 Table formatting from the original result was not included. Right Heart CATH #S/Pressures Right Heart Pressures Hemodynamic findings consistent with mild pulmonary hypertension. PAP-mean 33/17-25 mmHg; PCWP 13 mmHg; AOP 127/80 mmHg Ao sat 95%; PA sat 69%. Cardiac Output-Index Hiram Comber): 5.25-2.56-borderline Right Atrium Right atrial pressure is elevated. Mean RAP 10 mmHg Right Ventricle RVP-EDP 37/5-12 mmHg RECOMMENDATIONS Return to nurse here for ongoing care.  He appears to be adequately diuresed. Bryan Lemma, MD  US RENAL  Result Date: 04/30/2023 CLINICAL DATA:  Acute kidney injury EXAM: RENAL / URINARY TRACT ULTRASOUND COMPLETE COMPARISON:  CT abdomen pelvis 05/27/2021 FINDINGS: Right Kidney: Renal measurements: 9.0 x 6.0 x 3.8 cm = volume: 90 mL. Visualization of the right kidney is  limited due to adjacent shadowing bowel gas. Numerous right renal calculi seen on prior CT are not definitively identified on the current exam, however this is likely due to limited visualization rather than resolution. Left Kidney: Renal measurements: 9.0 x 4.1 x 4.5 cm = volume: 86 mL. Echogenicity within normal limits. No mass or hydronephrosis visualized. Bladder: Appears normal for degree of bladder distention. Other: None. IMPRESSION: 1. No hydronephrosis. 2. Limited visualization of the right kidney. No significant abnormality of the left kidney. Electronically Signed   By: Acquanetta Belling M.D.   On: 04/30/2023 10:17   DG Chest Port 1 View  Result Date: 04/30/2023 CLINICAL DATA:  82 year old male with history of shortness of breath. EXAM: PORTABLE CHEST 1 VIEW COMPARISON:  Chest x-ray 04/29/2023. FINDINGS: Elevation of the right hemidiaphragm again noted. Bibasilar opacities which may reflect areas of atelectasis and/or consolidation. Substantially improved aeration in the left lung base. No definite pleural effusions. No pneumothorax. No evidence of pulmonary edema. Heart size is normal. The patient is rotated to the right on today's exam, resulting in distortion of the mediastinal contours and reduced diagnostic sensitivity and specificity for mediastinal pathology. Atherosclerotic calcifications are noted in the thoracic aorta. IMPRESSION: 1. Persistent low lung volumes with bibasilar areas of atelectasis and/or consolidation, but with improving aeration in the left lung base, indicative of resolving airspace consolidation. 2. Aortic atherosclerosis. Electronically Signed   By: Trudie Reed M.D.   On: 04/30/2023 07:33   DG Chest Port 1 View  Result Date: 04/29/2023 CLINICAL DATA:  Shortness of breath EXAM: PORTABLE CHEST 1 VIEW COMPARISON:  04/27/2023 FINDINGS: Asymmetric elevation right hemidiaphragm. The cardio pericardial silhouette is enlarged. There is pulmonary vascular congestion without  overt pulmonary edema. Left base collapse/consolidation with small to moderate left pleural effusion. IMPRESSION: 1. Interval progression of left base collapse/consolidation with small to moderate left pleural effusion. 2.  Pulmonary vascular congestion without overt pulmonary edema. Electronically Signed   By: Kennith Center M.D.   On: 04/29/2023 08:50      Signature  -   Huey Bienenstock M.D on 05/02/2023 at 2:59 PM   -  To page go to [email or web address]

## 2023-05-03 ENCOUNTER — Other Ambulatory Visit (HOSPITAL_COMMUNITY): Payer: Self-pay

## 2023-05-03 DIAGNOSIS — J9621 Acute and chronic respiratory failure with hypoxia: Secondary | ICD-10-CM | POA: Diagnosis not present

## 2023-05-03 LAB — BASIC METABOLIC PANEL
Anion gap: 5 (ref 5–15)
BUN: 40 mg/dL — ABNORMAL HIGH (ref 8–23)
CO2: 37 mmol/L — ABNORMAL HIGH (ref 22–32)
Calcium: 8.5 mg/dL — ABNORMAL LOW (ref 8.9–10.3)
Chloride: 98 mmol/L (ref 98–111)
Creatinine, Ser: 1.31 mg/dL — ABNORMAL HIGH (ref 0.61–1.24)
GFR, Estimated: 54 mL/min — ABNORMAL LOW (ref >60)
Glucose, Bld: 166 mg/dL — ABNORMAL HIGH (ref 70–99)
Potassium: 4.7 mmol/L (ref 3.5–5.1)
Sodium: 140 mmol/L (ref 135–145)

## 2023-05-03 LAB — CBC WITH DIFFERENTIAL/PLATELET
Abs Immature Granulocytes: 0.05 10*3/uL (ref 0.00–0.07)
Basophils Absolute: 0 10*3/uL (ref 0.0–0.1)
Basophils Relative: 0 %
Eosinophils Absolute: 0 10*3/uL (ref 0.0–0.5)
Eosinophils Relative: 0 %
HCT: 46.8 % (ref 39.0–52.0)
Hemoglobin: 14.7 g/dL (ref 13.0–17.0)
Immature Granulocytes: 1 %
Lymphocytes Relative: 4 %
Lymphs Abs: 0.3 10*3/uL — ABNORMAL LOW (ref 0.7–4.0)
MCH: 31.7 pg (ref 26.0–34.0)
MCHC: 31.4 g/dL (ref 30.0–36.0)
MCV: 100.9 fL — ABNORMAL HIGH (ref 80.0–100.0)
Monocytes Absolute: 0.6 10*3/uL (ref 0.1–1.0)
Monocytes Relative: 7 %
Neutro Abs: 6.8 10*3/uL (ref 1.7–7.7)
Neutrophils Relative %: 88 %
Platelets: 150 10*3/uL (ref 150–400)
RBC: 4.64 MIL/uL (ref 4.22–5.81)
RDW: 14 % (ref 11.5–15.5)
WBC: 7.8 10*3/uL (ref 4.0–10.5)
nRBC: 0 % (ref 0.0–0.2)

## 2023-05-03 LAB — GLUCOSE, CAPILLARY: Glucose-Capillary: 141 mg/dL — ABNORMAL HIGH (ref 70–99)

## 2023-05-03 LAB — PROCALCITONIN: Procalcitonin: <0.1 ng/mL

## 2023-05-03 LAB — BRAIN NATRIURETIC PEPTIDE: B Natriuretic Peptide: 32.4 pg/mL (ref 0.0–100.0)

## 2023-05-03 LAB — MAGNESIUM: Magnesium: 2.2 mg/dL (ref 1.7–2.4)

## 2023-05-03 LAB — C-REACTIVE PROTEIN: CRP: 0.7 mg/dL (ref <1.0)

## 2023-05-03 MED ORDER — AMOXICILLIN-POT CLAVULANATE 500-125 MG PO TABS
1.0000 | ORAL_TABLET | Freq: Two times a day (BID) | ORAL | 0 refills | Status: AC
  Filled 2023-05-03: qty 6, 3d supply, fill #0

## 2023-05-03 MED ORDER — VITAMIN B-12 1000 MCG PO TABS
1000.0000 ug | ORAL_TABLET | Freq: Every day | ORAL | 0 refills | Status: AC
  Filled 2023-05-03: qty 30, 30d supply, fill #0

## 2023-05-03 MED ORDER — IPRATROPIUM-ALBUTEROL 0.5-2.5 (3) MG/3ML IN SOLN
3.0000 mL | Freq: Two times a day (BID) | RESPIRATORY_TRACT | 0 refills | Status: AC
  Filled 2023-05-03: qty 180, 30d supply, fill #0

## 2023-05-03 MED ORDER — FUROSEMIDE 20 MG PO TABS
20.0000 mg | ORAL_TABLET | Freq: Every day | ORAL | 11 refills | Status: DC
  Filled 2023-05-03: qty 60, 60d supply, fill #0

## 2023-05-03 NOTE — TOC Transition Note (Addendum)
Transition of Care [facility]) - Discharge Note   Patient Details  Name: Jeremiah [name] MRN: [id number] Date of Birth: 07/19/1940  [facility]) CM/SW Contact:  Gordy Clement, RN Phone Number: 05/03/2023, 9:44 AM   Clinical Narrative:     Patient will DC to home today.  [facility] will provide home health services to include PT/OT and SN for CHF management and teaching. A nebulizer has been ordered and will be delivered bedside prior to DC. Family to transport home .AVS updated   Update : 12:14 PM Patient had questions regarding CPAP function etc. RNCM has called [employer] liaison to request a RT to address this with Patient and family         Patient Goals and CMS Choice            Discharge Placement                       Discharge Plan and Services Additional resources added to the After Visit Summary for                                       Social Drivers of Health (SDOH) Interventions SDOH Screenings   Food Insecurity: No Food Insecurity (04/28/2023)  Housing: Low Risk  (04/28/2023)  Transportation Needs: No Transportation Needs (04/28/2023)  Utilities: Not At Risk (04/28/2023)  Depression (PHQ2-9): Low Risk  (06/21/2022)  Physical Activity: Unknown (06/13/2018)  Social Connections: Unknown (06/13/2018)  Stress: No Stress Concern Present (06/13/2018)  Tobacco Use: Medium Risk (04/27/2023)     Readmission Risk Interventions     No data to display

## 2023-05-03 NOTE — Discharge Summary (Signed)
Physician Discharge Summary  Jeremiah [name] JYN:[id number] DOB: [birth year] DOA: 04/27/2023  PCP: Rodrigo Ran, MD  Admit date: 04/27/2023 Discharge date: 05/03/2023  Admitted From: (Home) Disposition:  (Home)  Recommendations for Outpatient Follow-up:  Follow up with PCP in 1-2 weeks Please obtain BMP/CBC in one week,  monitor volume status closely and adjust Lasix as needed Please check B12 level as an outpatient, but meanwhile ensure he is compliant with B12 supplement.  Home Health: (YE) Equipment/Devices: Has home oxygen, will arrange for DME nebs   Diet recommendation: Heart Healthy, with 1.5 fluid restrictions  Brief/Interim Summary:  82 year old male with CAD status post PCI, chronic respiratory failure on O2 2 L - Pulm HTN,  NPH s/p VP shunt, diabetes mellitus type 2, HLP, HTN, BPH, CKD stage IIIa, OSA on CPAP, prostate CA, skin CA presented to ED with shortness of breath and hypoxia.  Patient has not been using his home O2.  Caregiver went over to his house and noted that his O2 sats were in the 70s on room air and cyanotic lips.  When EMS arrived, he was 80% on room air and was placed on 3 L O2 via [place], improved sats to mid 90s.  In ED, patient was noted to be still hypoxic and was placed on 6 L O2 via [place]. Per patient, he has been feeling short of breath for last 1 year, has been on home O2 2 L but does not use it regularly.  Patient is also noncompliant with CPAP.  Lives at home with his wife who has dementia and they have a caregiver who visits 3 times a week.  No chest pain, cough or fevers.   NAD, tachypneic, afebrile, no leukocytosis, troponins negative, creatinine 1.2, BNP 116.  CTA chest negative for PE, moderate to marked right hemidiaphragm elevation with persistent small right pleural effusion, similar bibasilar volume loss, pulmonary arterial hypertension.     Acute on chronic respiratory failure with hypoxia and hypercapnia  Chronic mild pulmonary  hypertension, Acute on chronic diastolic CHF Atelectasis in R base- no evidence of shunt on RHC. Has history of VP shunt into R pleural space chronically.  OSA, partial compliance with CPAP  Possible acute bronchitis, no evidence of pneumonia -Urology and pulmonary input greatly appreciated, patient presents with dyspnea, increased oxygen requirement, he is on 2 L nasal cannula at baseline, up to 8 L, this much improved during hospital stay, he is back to 2 L nasal cannula at rest, requiring up to 4 L with activity . -This was thought to be multifactorial, in the setting of volume overload due to acute on chronic diastolic CHF, this has improved with IV diuresis, acute bronchitis where he was treated with antibiotics and steroids, . -Cardiology input greatly appreciated, status post right heart cath 05/01/2023, significant for PAP-mean 33/17 arterial pressure elevated, with mean RAP at 10 mmHg, currently patient appears to be appropriately diuresed. -Discharged on p.o. Augmentin to finish total of 7 days. -Jent will be discharged on low-dose Lasix 20 mg  oral daily, and he was instructed to weigh himself daily, to comply with the fluid and salt restriction and to take, will arrange for visiting RN -He was encouraged to take both incentive spirometer and flutter valve at home and keep using them.  . - Negative respiratory viral panel and stable procalcitonin, -CTA negative for PE.    Macrocytosis -Continue with B12 supplement, please check B12 level as an outpatient     Mild ascending aortic dilation  -  CT showing similar mild ascending aortic dilation at 4.2 cm.  Recommended annual imaging follow-up with CTA or MRA.  Beta-blocker for now.   Mild hypernatremia.   -Resolved with some gentle IV fluids.   AKI on top of CKD stage IIIa  -Continue to diuresis, this has improved with holding IV Lasix and gentle hydration, creatinine currently back to baseline, he will be discharged on low-dose  Lasix, so we will need his BMP monitor closely as an outpatient .  after IV Lasix, Lasix had to be held improved after gentle hydration with IV fluids on 04/29/2023.   OSA Continue nightly CPAP.  He was counseled about importance of full compliance.   CAD status post PCI in September 2023, HTN, hyperlipidemia -Currently no acute chest pain, on Coreg, aspirin, Zetia, DAPT and statin for secondary prevention continue.   Diabetes mellitus type 2, uncontrolled with hyperglycemia  - Hemoglobin A1c 7.7, resume home regimen.    Discharge Diagnoses:  Principal Problem:   Acute on chronic respiratory failure with hypoxia (HCC) Active Problems:   HLD (hyperlipidemia)   HTN (hypertension)   DMII (diabetes mellitus, type 2) (HCC)   OSA on CPAP   Hypernatremia   Acute hypoxemic respiratory failure (HCC)   OSA (obstructive sleep apnea)    Discharge Instructions  Discharge Instructions     Diet - low sodium heart healthy   Complete by: As directed    Discharge instructions   Complete by: As directed    Follow with Primary MD Rodrigo Ran, MD in 7 days   Get CBC, CMP, 2 view Chest X ray checked  by Primary MD next visit.    Activity: As tolerated with Full fall precautions use walker/cane & assistance as needed   Disposition Home    Diet: Heart Healthy   For Heart failure patients - Check your Weight same time everyday, if you gain over 2 pounds, or you develop in leg swelling, experience more shortness of breath or chest pain, call your Primary MD immediately. Follow Cardiac Low Salt Diet and 1.5 lit/day fluid restriction.   On your next visit with your primary care physician please Get Medicines reviewed and adjusted.   Please request your Prim.MD to go over all Hospital Tests and Procedure/Radiological results at the follow up, please get all Hospital records sent to your Prim MD by signing hospital release before you go home.   If you experience worsening of your admission  symptoms, develop shortness of breath, life threatening emergency, suicidal or homicidal thoughts you must seek medical attention immediately by calling 911 or calling your MD immediately  if symptoms less severe.  You Must read complete instructions/literature along with all the possible adverse reactions/side effects for all the Medicines you take and that have been prescribed to you. Take any new Medicines after you have completely understood and accpet all the possible adverse reactions/side effects.   Do not drive, operating heavy machinery, perform activities at heights, swimming or participation in water activities or provide baby sitting services if your were admitted for syncope or siezures until you have seen by Primary MD or a Neurologist and advised to do so again.  Do not drive when taking Pain medications.    Do not take more than prescribed Pain, Sleep and Anxiety Medications  Special Instructions: If you have smoked or chewed Tobacco  in the last 2 yrs please stop smoking, stop any regular Alcohol  and or any Recreational drug use.  Wear Seat belts while driving.  Please note  You were cared for by a hospitalist during your hospital stay. If you have any questions about your discharge medications or the care you received while you were in the hospital after you are discharged, you can call the unit and asked to speak with the hospitalist on call if the hospitalist that took care of you is not available. Once you are discharged, your primary care physician will handle any further medical issues. Please note that NO REFILLS for any discharge medications will be authorized once you are discharged, as it is imperative that you return to your primary care physician (or establish a relationship with a primary care physician if you do not have one) for your aftercare needs so that they can reassess your need for medications and monitor your lab values.   Increase activity slowly    Complete by: As directed       Allergies as of 05/03/2023       Reactions   Cefepime Other (See Comments)   toxic encephalopathy   Lipitor [atorvastatin] Other (See Comments)   Hip pain        Medication List     TAKE these medications    amoxicillin-clavulanate 500-125 MG tablet Commonly known as: AUGMENTIN Take 1 tablet by mouth 2 (two) times daily for 3 days.   aspirin EC 81 MG tablet Take 81 mg by mouth daily.   carvedilol 12.5 MG tablet Commonly known as: COREG Take 1 tablet (12.5 mg total) by mouth 2 (two) times daily.   cetirizine 10 MG tablet Commonly known as: ZYRTEC Take 10 mg by mouth daily.   cholecalciferol 25 MCG (1000 UNIT) tablet Commonly known as: VITAMIN D3 Take 2,000 Units by mouth daily.   clopidogrel 75 MG tablet Commonly known as: PLAVIX Take 1 tablet by mouth once daily with breakfast   cyanocobalamin 500 MCG tablet Commonly known as: VITAMIN B12 Take 2 tablets (1,000 mcg total) by mouth daily. What changed:  how much to take additional instructions   ezetimibe 10 MG tablet Commonly known as: ZETIA Take 10 mg by mouth daily.   furosemide 20 MG tablet Commonly known as: Lasix Take 1 tablet (20 mg total) by mouth daily. Please take extra tablet in the afternoon if developing lower extremity edema or> 3 pounds fluid retention/weight gain overnight   ipratropium-albuterol 0.5-2.5 (3) MG/3ML Soln Commonly known as: DUONEB Take 3 mLs by nebulization 2 (two) times daily.   metFORMIN 500 MG 24 hr tablet Commonly known as: GLUCOPHAGE-XR Take 500 mg by mouth daily.   pantoprazole 20 MG tablet Commonly known as: PROTONIX Take 20 mg by mouth daily before breakfast.   PreserVision AREDS Caps Take 2 capsules by mouth daily.   rosuvastatin 10 MG tablet Commonly known as: Crestor Take 1 tablet (10 mg total) by mouth daily.               Durable Medical Equipment  (From admission, onward)           Start     Ordered    05/03/23 0930  For home use only DME Nebulizer machine  Once       Question Answer Comment  Patient needs a nebulizer to treat with the following condition Acute CHF (congestive heart failure) (HCC)   Length of Need 12 Months      05/03/23 0930            Follow-up Information     Hunsucker, Lesia Sago, MD Follow up  on 07/04/2023.   Specialty: Pulmonary Disease Why: 11:15 pm with dr. Chanetta Marshall information: [address] [place] [address] [phone]                Allergies  Allergen Reactions   Cefepime Other (See Comments)    toxic encephalopathy   Lipitor [Atorvastatin] Other (See Comments)    Hip pain    Consultations: Pulmonary Cardiology   Procedures/Studies: CARDIAC CATHETERIZATION Result Date: 05/01/2023 Table formatting from the original result was not included. Right Heart CATH #S/Pressures Right Heart Pressures Hemodynamic findings consistent with mild pulmonary hypertension. PAP-mean 33/17-25 mmHg; PCWP 13 mmHg; AOP 127/80 mmHg Ao sat 95%; PA sat 69%. Cardiac Output-Index Hiram Comber): 5.25-2.56-borderline Right Atrium Right atrial pressure is elevated. Mean RAP 10 mmHg Right Ventricle RVP-EDP 37/5-12 mmHg RECOMMENDATIONS Return to nurse here for ongoing care.  He appears to be adequately diuresed. Bryan Lemma, MD  US RENAL Result Date: 04/30/2023 CLINICAL DATA:  Acute kidney injury EXAM: RENAL / URINARY TRACT ULTRASOUND COMPLETE COMPARISON:  CT abdomen pelvis 05/27/2021 FINDINGS: Right Kidney: Renal measurements: 9.0 x 6.0 x 3.8 cm = volume: 90 mL. Visualization of the right kidney is limited due to adjacent shadowing bowel gas. Numerous right renal calculi seen on prior CT are not definitively identified on the current exam, however this is likely due to limited visualization rather than resolution. Left Kidney: Renal measurements: 9.0 x 4.1 x 4.5 cm = volume: 86 mL. Echogenicity within normal limits. No mass or hydronephrosis  visualized. Bladder: Appears normal for degree of bladder distention. Other: None. IMPRESSION: 1. No hydronephrosis. 2. Limited visualization of the right kidney. No significant abnormality of the left kidney. Electronically Signed   By: Acquanetta Belling M.D.   On: 04/30/2023 10:17   DG Chest Port 1 View Result Date: 04/30/2023 CLINICAL DATA:  82 year old male with history of shortness of breath. EXAM: PORTABLE CHEST 1 VIEW COMPARISON:  Chest x-ray 04/29/2023. FINDINGS: Elevation of the right hemidiaphragm again noted. Bibasilar opacities which may reflect areas of atelectasis and/or consolidation. Substantially improved aeration in the left lung base. No definite pleural effusions. No pneumothorax. No evidence of pulmonary edema. Heart size is normal. The patient is rotated to the right on today's exam, resulting in distortion of the mediastinal contours and reduced diagnostic sensitivity and specificity for mediastinal pathology. Atherosclerotic calcifications are noted in the thoracic aorta. IMPRESSION: 1. Persistent low lung volumes with bibasilar areas of atelectasis and/or consolidation, but with improving aeration in the left lung base, indicative of resolving airspace consolidation. 2. Aortic atherosclerosis. Electronically Signed   By: Trudie Reed M.D.   On: 04/30/2023 07:33   DG Chest Port 1 View Result Date: 04/29/2023 CLINICAL DATA:  Shortness of breath EXAM: PORTABLE CHEST 1 VIEW COMPARISON:  04/27/2023 FINDINGS: Asymmetric elevation right hemidiaphragm. The cardio pericardial silhouette is enlarged. There is pulmonary vascular congestion without overt pulmonary edema. Left base collapse/consolidation with small to moderate left pleural effusion. IMPRESSION: 1. Interval progression of left base collapse/consolidation with small to moderate left pleural effusion. 2. Pulmonary vascular congestion without overt pulmonary edema. Electronically Signed   By: Kennith Center M.D.   On: 04/29/2023 08:50    ECHOCARDIOGRAM COMPLETE Result Date: 04/28/2023    ECHOCARDIOGRAM REPORT   Patient Name:   JAYESH KUCHARSKI Date of Exam: 04/28/2023 Medical Rec #:  [id number]       Height:       70.0 in Accession #:    [id number]  Weight:       190.9 lb Date of Birth:  06/10/1940       BSA:          2.047 m Patient Age:    61 years        BP:           131/71 mmHg Patient Gender: M               HR:           79 bpm. Exam Location:  Inpatient Procedure: 2D Echo, Color Doppler, Cardiac Doppler and Intracardiac            Opacification Agent Indications:    Pulmonary Hypertension  History:        Patient has prior history of Echocardiogram examinations, most                 recent 01/21/2022. NSTEMI; Risk Factors:Sleep Apnea, Dyslipidemia,                 Hypertension and Diabetes.  Sonographer:    [facility] Referring Phys: [id number] VASUNDHRA RATHORE IMPRESSIONS  1. Left ventricular ejection fraction, by estimation, is 60 to 65%. The left ventricle has normal function. The left ventricle has no regional wall motion abnormalities. There is moderate left ventricular hypertrophy. Left ventricular diastolic parameters are consistent with Grade I diastolic dysfunction (impaired relaxation).  2. Right ventricular systolic function is mildly reduced. The right ventricular size is normal.  3. The mitral valve is normal in structure. No evidence of mitral valve regurgitation. No evidence of mitral stenosis.  4. The aortic valve is tricuspid. There is mild calcification of the aortic valve. There is mild thickening of the aortic valve. Aortic valve regurgitation is trivial. Aortic valve sclerosis is present, with no evidence of aortic valve stenosis.  5. Aortic dilatation noted. There is mild dilatation of the ascending aorta, measuring 41 mm.  6. The inferior vena cava is normal in size with greater than 50% respiratory variability, suggesting right atrial pressure of 3 mmHg. FINDINGS  Left Ventricle: Left ventricular ejection fraction, by  estimation, is 60 to 65%. The left ventricle has normal function. The left ventricle has no regional wall motion abnormalities. Definity contrast agent was given IV to delineate the left ventricular  endocardial borders. The left ventricular internal cavity size was normal in size. There is moderate left ventricular hypertrophy. Left ventricular diastolic parameters are consistent with Grade I diastolic dysfunction (impaired relaxation). Right Ventricle: The right ventricular size is normal. No increase in right ventricular wall thickness. Right ventricular systolic function is mildly reduced. Left Atrium: Left atrial size was normal in size. Right Atrium: Right atrial size was normal in size. Pericardium: There is no evidence of pericardial effusion. Mitral Valve: The mitral valve is normal in structure. No evidence of mitral valve regurgitation. No evidence of mitral valve stenosis. Tricuspid Valve: The tricuspid valve is normal in structure. Tricuspid valve regurgitation is not demonstrated. No evidence of tricuspid stenosis. Aortic Valve: The aortic valve is tricuspid. There is mild calcification of the aortic valve. There is mild thickening of the aortic valve. Aortic valve regurgitation is trivial. Aortic valve sclerosis is present, with no evidence of aortic valve stenosis. Aortic valve mean gradient measures 5.0 mmHg. Aortic valve peak gradient measures 9.9 mmHg. Aortic valve area, by VTI measures 2.86 cm. Pulmonic Valve: The pulmonic valve was normal in structure. Pulmonic valve regurgitation is not visualized. No evidence of pulmonic stenosis. Aorta: Aortic dilatation noted.  There is mild dilatation of the ascending aorta, measuring 41 mm. Venous: The inferior vena cava is normal in size with greater than 50% respiratory variability, suggesting right atrial pressure of 3 mmHg. IAS/Shunts: The interatrial septum was not well visualized.  LEFT VENTRICLE PLAX 2D LVIDd:         4.40 cm   Diastology LVIDs:          3.00 cm   LV e' medial:    6.96 cm/s LV PW:         1.20 cm   LV E/e' medial:  10.6 LV IVS:        1.40 cm   LV e' lateral:   11.10 cm/s LVOT diam:     2.10 cm   LV E/e' lateral: 6.7 LV SV:         91 LV SV Index:   45 LVOT Area:     3.46 cm  RIGHT VENTRICLE RV Basal diam:  3.10 cm RV Mid diam:    3.40 cm RV S prime:     14.80 cm/s TAPSE (M-mode): 1.9 cm LEFT ATRIUM             Index        RIGHT ATRIUM           Index LA diam:        3.80 cm 1.86 cm/m   RA Area:     18.80 cm LA Vol (A2C):   49.3 ml 24.09 ml/m  RA Volume:   51.80 ml  25.31 ml/m LA Vol (A4C):   36.9 ml 18.03 ml/m LA Biplane Vol: 44.1 ml 21.55 ml/m  AORTIC VALVE AV Area (Vmax):    2.85 cm AV Area (Vmean):   2.91 cm AV Area (VTI):     2.86 cm AV Vmax:           157.00 cm/s AV Vmean:          101.000 cm/s AV VTI:            0.320 m AV Peak Grad:      9.9 mmHg AV Mean Grad:      5.0 mmHg LVOT Vmax:         129.00 cm/s LVOT Vmean:        84.900 cm/s LVOT VTI:          0.264 m LVOT/AV VTI ratio: 0.82  AORTA Ao Root diam: 4.10 cm Ao Asc diam:  4.10 cm MITRAL VALVE               TRICUSPID VALVE MV Area (PHT): 3.42 cm    TR Peak grad:   34.6 mmHg MV Decel Time: 222 msec    TR Vmax:        294.00 cm/s MV E velocity: 73.90 cm/s MV A velocity: 96.40 cm/s  SHUNTS MV E/A ratio:  0.77        Systemic VTI:  0.26 m                            Systemic Diam: 2.10 cm Charlton Haws MD Electronically signed by Charlton Haws MD Signature Date/Time: 04/28/2023/11:38:38 AM    Final    CT Angio Chest PE W and/or Wo Contrast Result Date: 04/27/2023 CLINICAL DATA:  Shortness of breath and weakness EXAM: CT ANGIOGRAPHY CHEST WITH CONTRAST TECHNIQUE: Multidetector CT imaging of the chest was performed using the standard protocol during bolus administration of intravenous  contrast. Multiplanar CT image reconstructions and MIPs were obtained to evaluate the vascular anatomy. RADIATION DOSE REDUCTION: This exam was performed according to the departmental  dose-optimization program which includes automated exposure control, adjustment of the mA and/or kV according to patient size and/or use of iterative reconstruction technique. CONTRAST:  75mL OMNIPAQUE IOHEXOL 350 MG/ML SOLN COMPARISON:  Plain film of earlier today.  CTA chest 01/27/2022. FINDINGS: Cardiovascular: The quality of this exam for evaluation of pulmonary embolism is moderate. The bolus is centered in the [facility] to the and there is minimal motion degradation. No evidence of pulmonary embolism. Aortic atherosclerosis. Mild ascending aortic dilatation including at 4.2 cm, similar. Tortuous descending thoracic aorta. Mild cardiomegaly, without pericardial effusion. Lad and right coronary artery calcification. Pulmonary artery enlargement, outflow tract 3.7 cm. Mediastinum/Nodes: No mediastinal or hilar adenopathy. Lungs/Pleura: Small right pleural effusion, minimally increased. Moderate right hemidiaphragm elevation. VP shunt catheter again identified about the right chest wall, terminating in the right pleural space inferiorly. Right greater than left base volume loss and airspace disease, all favored to represent atelectasis and scar. Upper Abdomen: small dependent gallstones. Normal imaged portions of the liver, spleen, stomach, pancreas, adrenal glands. Musculoskeletal: No acute osseous abnormality. Review of the MIP images confirms the above findings. IMPRESSION: 1. No pulmonary embolism with above limitations. 2. Moderate to marked right hemidiaphragm elevation with persistent small right pleural effusion and similar bibasilar volume loss with atelectasis and scar. 3. Similar mild ascending aortic dilatation at 4.2 cm. Recommend annual imaging followup by CTA or MRA. This recommendation follows 2010 ACCF/AHA/AATS/ACR/ASA/SCA/SCAI/SIR/STS/SVM Guidelines for the Diagnosis and Management of Patients with Thoracic Aortic Disease. Circulation. 2010; 121: Z610-R604. Aortic aneurysm NOS (ICD10-I71.9) 4. Pulmonary  artery enlargement suggests pulmonary arterial hypertension. 5. Cholelithiasis 6. Aortic Atherosclerosis (ICD10-I70.0). Coronary artery atherosclerosis. Electronically Signed   By: Jeronimo Greaves M.D.   On: 04/27/2023 17:34   DG Chest Port 1 View Result Date: 04/27/2023 CLINICAL DATA:  Shortness of breath.  Fatigue. EXAM: PORTABLE CHEST 1 VIEW COMPARISON:  01/27/2022. FINDINGS: Low lung volume. There are probable atelectatic changes/scarring at the lung bases. No acute consolidation or lung collapse. Apparent blunting of bilateral lateral costophrenic angles may be due to trace pleural effusion versus superimposed soft tissue. No pneumothorax. Stable cardio-mediastinal silhouette. No acute osseous abnormalities. The soft tissues are within normal limits. Presumed VP shunt tubing noted along the right lateral chest. IMPRESSION: *Bibasilar atelectasis/scarring. No acute consolidation or lung collapse. Electronically Signed   By: Jules Schick M.D.   On: 04/27/2023 14:24      Subjective:  No significant events overnight, he had a good night sleep, no dyspnea, ambulated in the hallway yesterday maintaining good oxygen saturation on 2 to 4 L nasal cannula, he is on 2 L this morning saturating 94%. Discharge Exam: Vitals:   05/03/23 0450 05/03/23 0811  BP:  118/78  Pulse:  83  Resp:  17  Temp: 97.7 F (36.5 C) 98.3 F (36.8 C)  SpO2:  90%   Vitals:   05/02/23 2315 05/02/23 2316 05/03/23 0450 05/03/23 0811  BP:  121/65  118/78  Pulse:  71  83  Resp: (!) 23 (!) 25  17  Temp:  98.5 F (36.9 C) 97.7 F (36.5 C) 98.3 F (36.8 C)  TempSrc:  Axillary Axillary Oral  SpO2:  94%  90%  Weight:      Height:        General: Pt is alert, awake, not in acute distress Cardiovascular: RRR, S1/S2 +, no  rubs, no gallops Respiratory: CTA bilaterally, no wheezing, no rhonchi Abdominal: Soft, NT, ND, bowel sounds + Extremities: no edema, no cyanosis    The results of significant diagnostics from this  hospitalization (including imaging, microbiology, ancillary and laboratory) are listed below for reference.     Microbiology: Recent Results (from the past 240 hours)  Resp panel by RT-PCR (RSV, Flu A&B, Covid) Anterior Nasal Swab     Status: None   Collection Time: 04/27/23 11:56 AM   Specimen: Anterior Nasal Swab  Result Value Ref Range Status   SARS Coronavirus 2 by RT PCR NEGATIVE NEGATIVE Final   Influenza A by PCR NEGATIVE NEGATIVE Final   Influenza B by PCR NEGATIVE NEGATIVE Final    Comment: (NOTE) The Xpert Xpress SARS-CoV-2/FLU/RSV plus assay is intended as an aid in the diagnosis of influenza from Nasopharyngeal swab specimens and should not be used as a sole basis for treatment. Nasal washings and aspirates are unacceptable for Xpert Xpress SARS-CoV-2/FLU/RSV testing.  Fact Sheet for Patients: BloggerCourse.com  Fact Sheet for Healthcare Providers: SeriousBroker.it  This test is not yet approved or cleared by the [place] FDA and has been authorized for detection and/or diagnosis of SARS-CoV-2 by FDA under an Emergency Use Authorization (EUA). This EUA will remain in effect (meaning this test can be used) for the duration of the COVID-19 declaration under Section 564(b)(1) of the Act, 21 U.S.C. section 360bbb-3(b)(1), unless the authorization is terminated or revoked.     Resp Syncytial Virus by PCR NEGATIVE NEGATIVE Final    Comment: (NOTE) Fact Sheet for Patients: BloggerCourse.com  Fact Sheet for Healthcare Providers: SeriousBroker.it  This test is not yet approved or cleared by the [place] FDA and has been authorized for detection and/or diagnosis of SARS-CoV-2 by FDA under an Emergency Use Authorization (EUA). This EUA will remain in effect (meaning this test can be used) for the duration of the COVID-19 declaration under Section 564(b)(1) of the  Act, 21 U.S.C. section 360bbb-3(b)(1), unless the authorization is terminated or revoked.  Performed at [facility], [address] [place], [place] [address]   Respiratory (~20 pathogens) panel by PCR     Status: None   Collection Time: 04/30/23  6:12 AM   Specimen: Nasopharyngeal Swab; Respiratory  Result Value Ref Range Status   Adenovirus NOT DETECTED NOT DETECTED Final   Coronavirus 229E NOT DETECTED NOT DETECTED Final    Comment: (NOTE) The Coronavirus on the Respiratory Panel, DOES NOT test for the novel  Coronavirus (2019 nCoV)    Coronavirus HKU1 NOT DETECTED NOT DETECTED Final   Coronavirus NL63 NOT DETECTED NOT DETECTED Final   Coronavirus OC43 NOT DETECTED NOT DETECTED Final   Metapneumovirus NOT DETECTED NOT DETECTED Final   Rhinovirus / Enterovirus NOT DETECTED NOT DETECTED Final   Influenza A NOT DETECTED NOT DETECTED Final   Influenza B NOT DETECTED NOT DETECTED Final   Parainfluenza Virus 1 NOT DETECTED NOT DETECTED Final   Parainfluenza Virus 2 NOT DETECTED NOT DETECTED Final   Parainfluenza Virus 3 NOT DETECTED NOT DETECTED Final   Parainfluenza Virus 4 NOT DETECTED NOT DETECTED Final   Respiratory Syncytial Virus NOT DETECTED NOT DETECTED Final   Bordetella pertussis NOT DETECTED NOT DETECTED Final   Bordetella Parapertussis NOT DETECTED NOT DETECTED Final   Chlamydophila pneumoniae NOT DETECTED NOT DETECTED Final   Mycoplasma pneumoniae NOT DETECTED NOT DETECTED Final    Comment: Performed at [facility], [address] [place],  [place] [address]     Labs: BNP (last 3 results) Recent Labs    05/01/23 0435 05/02/23 0510 05/03/23 0529  BNP 290.8* 57.5 32.4   Basic Metabolic Panel: Recent Labs  Lab 04/29/23 0546 04/30/23 0457 05/01/23 0435 05/01/23 1854 05/02/23 0510 05/03/23 0529  NA 147* 141 141 142  140 139 140  K 4.3 4.5 4.9 4.3  4.5 4.3 4.7  CL 105 97* 99  --  99 98  CO2 31 36* 36*  --  36* 37*  GLUCOSE 203* 193* 207*   --  218* 166*  BUN 39* 53* 46*  --  42* 40*  CREATININE 1.53* 2.14* 1.46*  --  1.34* 1.31*  CALCIUM 8.8* 8.5* 8.7*  --  8.8* 8.5*  MG 2.0 2.2 2.1  --  2.1 2.2   Liver Function Tests: Recent Labs  Lab 04/27/23 1327  AST 14*  ALT 9  ALKPHOS 54  BILITOT 1.2*  PROT 5.8*  ALBUMIN 3.3*   No results for input(s): "LIPASE", "AMYLASE" in the last 168 hours. No results for input(s): "AMMONIA" in the last 168 hours. CBC: Recent Labs  Lab 04/29/23 0546 04/30/23 0457 05/01/23 0435 05/01/23 1854 05/02/23 0510 05/03/23 0529  WBC 10.6* 10.4 7.5  --  8.9 7.8  NEUTROABS 9.8* 9.4* 7.1  --  7.9* 6.8  HGB 14.3 14.9 14.7 15.3  15.6 14.6 14.7  HCT 46.5 48.9 47.8 45.0  46.0 46.0 46.8  MCV 103.3* 104.7* 101.9*  --  99.4 100.9*  PLT 134* 143* 145*  --  144* 150   Cardiac Enzymes: No results for input(s): "CKTOTAL", "CKMB", "CKMBINDEX", "TROPONINI" in the last 168 hours. BNP: Invalid input(s): "POCBNP" CBG: Recent Labs  Lab 05/02/23 1203 05/02/23 1556 05/02/23 1717 05/02/23 1940 05/03/23 0816  GLUCAP 106* 158* 241* 227* 141*   D-Dimer No results for input(s): "DDIMER" in the last 72 hours. Hgb A1c No results for input(s): "HGBA1C" in the last 72 hours. Lipid Profile No results for input(s): "CHOL", "HDL", "LDLCALC", "TRIG", "CHOLHDL", "LDLDIRECT" in the last 72 hours. Thyroid function studies No results for input(s): "TSH", "T4TOTAL", "T3FREE", "THYROIDAB" in the last 72 hours.  Invalid input(s): "FREET3" Anemia work up No results for input(s): "VITAMINB12", "FOLATE", "FERRITIN", "TIBC", "IRON", "RETICCTPCT" in the last 72 hours. Urinalysis    Component Value Date/Time   COLORURINE YELLOW 01/27/2022 1907   APPEARANCEUR CLEAR 01/27/2022 1907   LABSPEC 1.025 01/27/2022 1907   PHURINE 5.0 01/27/2022 1907   GLUCOSEU >=500 (A) 01/27/2022 1907   HGBUR NEGATIVE 01/27/2022 1907   BILIRUBINUR NEGATIVE 01/27/2022 1907   KETONESUR NEGATIVE 01/27/2022 1907   PROTEINUR NEGATIVE  01/27/2022 1907   UROBILINOGEN 1.0 08/25/2008 0326   NITRITE NEGATIVE 01/27/2022 1907   LEUKOCYTESUR NEGATIVE 01/27/2022 1907   Sepsis Labs Recent Labs  Lab 04/30/23 0457 05/01/23 0435 05/02/23 0510 05/03/23 0529  WBC 10.4 7.5 8.9 7.8   Microbiology Recent Results (from the past 240 hours)  Resp panel by RT-PCR (RSV, Flu A&B, Covid) Anterior Nasal Swab     Status: None   Collection Time: 04/27/23 11:56 AM   Specimen: Anterior Nasal Swab  Result Value Ref Range Status   SARS Coronavirus 2 by RT PCR NEGATIVE NEGATIVE Final   Influenza A by PCR NEGATIVE NEGATIVE Final   Influenza B by PCR NEGATIVE NEGATIVE Final    Comment: (NOTE) The Xpert Xpress SARS-CoV-2/FLU/RSV plus assay is intended as an aid in the diagnosis of influenza from Nasopharyngeal swab specimens and should not be used  as a sole basis for treatment. Nasal washings and aspirates are unacceptable for Xpert Xpress SARS-CoV-2/FLU/RSV testing.  Fact Sheet for Patients: BloggerCourse.com  Fact Sheet for Healthcare Providers: SeriousBroker.it  This test is not yet approved or cleared by the [place] FDA and has been authorized for detection and/or diagnosis of SARS-CoV-2 by FDA under an Emergency Use Authorization (EUA). This EUA will remain in effect (meaning this test can be used) for the duration of the COVID-19 declaration under Section 564(b)(1) of the Act, 21 U.S.C. section 360bbb-3(b)(1), unless the authorization is terminated or revoked.     Resp Syncytial Virus by PCR NEGATIVE NEGATIVE Final    Comment: (NOTE) Fact Sheet for Patients: BloggerCourse.com  Fact Sheet for Healthcare Providers: SeriousBroker.it  This test is not yet approved or cleared by the [place] FDA and has been authorized for detection and/or diagnosis of SARS-CoV-2 by FDA under an Emergency Use Authorization (EUA). This  EUA will remain in effect (meaning this test can be used) for the duration of the COVID-19 declaration under Section 564(b)(1) of the Act, 21 U.S.C. section 360bbb-3(b)(1), unless the authorization is terminated or revoked.  Performed at [facility], [address] [place], [place] [address]   Respiratory (~20 pathogens) panel by PCR     Status: None   Collection Time: 04/30/23  6:12 AM   Specimen: Nasopharyngeal Swab; Respiratory  Result Value Ref Range Status   Adenovirus NOT DETECTED NOT DETECTED Final   Coronavirus 229E NOT DETECTED NOT DETECTED Final    Comment: (NOTE) The Coronavirus on the Respiratory Panel, DOES NOT test for the novel  Coronavirus (2019 nCoV)    Coronavirus HKU1 NOT DETECTED NOT DETECTED Final   Coronavirus NL63 NOT DETECTED NOT DETECTED Final   Coronavirus OC43 NOT DETECTED NOT DETECTED Final   Metapneumovirus NOT DETECTED NOT DETECTED Final   Rhinovirus / Enterovirus NOT DETECTED NOT DETECTED Final   Influenza A NOT DETECTED NOT DETECTED Final   Influenza B NOT DETECTED NOT DETECTED Final   Parainfluenza Virus 1 NOT DETECTED NOT DETECTED Final   Parainfluenza Virus 2 NOT DETECTED NOT DETECTED Final   Parainfluenza Virus 3 NOT DETECTED NOT DETECTED Final   Parainfluenza Virus 4 NOT DETECTED NOT DETECTED Final   Respiratory Syncytial Virus NOT DETECTED NOT DETECTED Final   Bordetella pertussis NOT DETECTED NOT DETECTED Final   Bordetella Parapertussis NOT DETECTED NOT DETECTED Final   Chlamydophila pneumoniae NOT DETECTED NOT DETECTED Final   Mycoplasma pneumoniae NOT DETECTED NOT DETECTED Final    Comment: Performed at [facility], [address], [place], [place] [address]     Time coordinating discharge: Over 30 minutes  SIGNED:   Huey Bienenstock, MD  [facility] 05/03/2023, 9:30 AM Pager   If 7PM-7AM, please contact night-coverage [email or web address]

## 2023-05-03 NOTE — Discharge Instructions (Signed)
Follow with Primary MD Rodrigo Ran, MD in 7 days   Get CBC, CMP, 2 view Chest X ray checked  by Primary MD next visit.    Activity: As tolerated with Full fall precautions use walker/cane & assistance as needed   Disposition Home    Diet: Heart Healthy   For Heart failure patients - Check your Weight same time everyday, if you gain over 2 pounds, or you develop in leg swelling, experience more shortness of breath or chest pain, call your Primary MD immediately. Follow Cardiac Low Salt Diet and 1.5 lit/day fluid restriction.   On your next visit with your primary care physician please Get Medicines reviewed and adjusted.   Please request your Prim.MD to go over all Hospital Tests and Procedure/Radiological results at the follow up, please get all Hospital records sent to your Prim MD by signing hospital release before you go home.   If you experience worsening of your admission symptoms, develop shortness of breath, life threatening emergency, suicidal or homicidal thoughts you must seek medical attention immediately by calling 911 or calling your MD immediately  if symptoms less severe.  You Must read complete instructions/literature along with all the possible adverse reactions/side effects for all the Medicines you take and that have been prescribed to you. Take any new Medicines after you have completely understood and accpet all the possible adverse reactions/side effects.   Do not drive, operating heavy machinery, perform activities at heights, swimming or participation in water activities or provide baby sitting services if your were admitted for syncope or siezures until you have seen by Primary MD or a Neurologist and advised to do so again.  Do not drive when taking Pain medications.    Do not take more than prescribed Pain, Sleep and Anxiety Medications  Special Instructions: If you have smoked or chewed Tobacco  in the last 2 yrs please stop smoking, stop any regular  Alcohol  and or any Recreational drug use.  Wear Seat belts while driving.   Please note  You were cared for by a hospitalist during your hospital stay. If you have any questions about your discharge medications or the care you received while you were in the hospital after you are discharged, you can call the unit and asked to speak with the hospitalist on call if the hospitalist that took care of you is not available. Once you are discharged, your primary care physician will handle any further medical issues. Please note that NO REFILLS for any discharge medications will be authorized once you are discharged, as it is imperative that you return to your primary care physician (or establish a relationship with a primary care physician if you do not have one) for your aftercare needs so that they can reassess your need for medications and monitor your lab values.

## 2023-05-03 NOTE — Plan of Care (Signed)

## 2023-05-03 NOTE — Plan of Care (Signed)

## 2023-05-03 NOTE — Progress Notes (Signed)
Mobility Specialist Progress Note;   05/03/23 1100  Mobility  Activity Ambulated with assistance in hallway  Level of Assistance Contact guard assist, steadying assist  Assistive Device None  Distance Ambulated (ft) 400 ft  Activity Response Tolerated well  Mobility Referral Yes  Mobility visit 1 Mobility  Mobility Specialist Start Time (ACUTE ONLY) 1100  Mobility Specialist Stop Time (ACUTE ONLY) 1120  Mobility Specialist Time Calculation (min) (ACUTE ONLY) 20 min    Pre-mobility: SPO2 93% 2LO2 During-mobility: SPO2 88-93% 2LO2 Post-mobility: SPO2 94% 2LO2  Pt eager for mobility. Requested assistance to BR at BOS, void successful. Required MinG assistance during ambulation for safety. On 2LO2 upon arrival, ambulated on 2LO2, VSS throughout. No c/o during session. Pt returned back to chair with all needs met.   Caesar Bookman Mobility Specialist Please contact via SecureChat or Rehab Office [phone]

## 2023-05-10 DIAGNOSIS — I5032 Chronic diastolic (congestive) heart failure: Secondary | ICD-10-CM | POA: Diagnosis not present

## 2023-05-10 DIAGNOSIS — E1122 Type 2 diabetes mellitus with diabetic chronic kidney disease: Secondary | ICD-10-CM | POA: Diagnosis not present

## 2023-05-10 DIAGNOSIS — I272 Pulmonary hypertension, unspecified: Secondary | ICD-10-CM | POA: Diagnosis not present

## 2023-05-10 DIAGNOSIS — N1831 Chronic kidney disease, stage 3a: Secondary | ICD-10-CM | POA: Diagnosis not present

## 2023-05-11 DIAGNOSIS — J9601 Acute respiratory failure with hypoxia: Secondary | ICD-10-CM | POA: Diagnosis not present

## 2023-05-14 DIAGNOSIS — J9601 Acute respiratory failure with hypoxia: Secondary | ICD-10-CM | POA: Diagnosis not present

## 2023-06-11 DIAGNOSIS — J9601 Acute respiratory failure with hypoxia: Secondary | ICD-10-CM | POA: Diagnosis not present

## 2023-06-14 DIAGNOSIS — J9601 Acute respiratory failure with hypoxia: Secondary | ICD-10-CM | POA: Diagnosis not present

## 2023-06-18 DIAGNOSIS — G912 (Idiopathic) normal pressure hydrocephalus: Secondary | ICD-10-CM | POA: Diagnosis not present

## 2023-06-22 ENCOUNTER — Other Ambulatory Visit (HOSPITAL_COMMUNITY): Payer: Self-pay

## 2023-07-04 ENCOUNTER — Ambulatory Visit: Payer: Medicare Other | Admitting: Pulmonary Disease

## 2023-07-04 ENCOUNTER — Encounter: Payer: Self-pay | Admitting: Pulmonary Disease

## 2023-07-04 VITALS — BP 122/82 | HR 77 | Temp 97.6°F | Ht 70.0 in | Wt 195.0 lb

## 2023-07-04 DIAGNOSIS — Z9981 Dependence on supplemental oxygen: Secondary | ICD-10-CM | POA: Diagnosis not present

## 2023-07-04 DIAGNOSIS — J9611 Chronic respiratory failure with hypoxia: Secondary | ICD-10-CM | POA: Diagnosis not present

## 2023-07-04 NOTE — Progress Notes (Signed)
@Patient  ID: Jeremiah [name], male    DOB: 04/29/1941, 83 y.o.   MRN: [id number]  Chief Complaint  Patient presents with   Hospitalization Follow-up    Patient states SOB is improved.  Has had dry cough for years, otherwise doing well.    Referring provider: Rodrigo Ran, MD  HPI:   83 y.o. man with history of chronic hypoxemic respiratory failure whom we are seeing for hospital follow-up.  Multiple hospital notes reviewed.   Hospitalized 11 through 12 2024 with worsening hypoxemia.  Appears to the related was aggressively diuresed.  Ox requirement persisted.  Discussed at length throughout the hospitalization, likely shunt physiology.  We discussed this in the past as well.  Gradually improved.  Discharged on 3 L nasal cannula.  Previously 2 L he believes.  Overall dyspnea at home doing well.  Fluid staying off.  Minimal lower extremity edema.  Dyspnea unchanged.  Multiple questions asked and answered the best my ability guarding sleep apnea, oxygen use, other device use, medication use etc.  HPI initial visit: Length of need for oxygen is unclear.  He was documented on 2 L nasal cannula at home on discharge summary 01/25/2022.  It sounds like he had oxygen at home is using intermittently, sometimes at night.  Regardless, presented to the ED 01/20/2022.  With chest pressure.  Some concern for NSTEMI.  Mild elevation of troponin.  Blood pressure was elevated.  Placed on heparin.  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  Chest x-ray on admission on my review and interpretation reveals clear lungs with elevated right hemidiaphragm and atelectatic changes.  He presented to the ED 2 days later, 01/27/2022 after discharge.  This was for altered mental status.  He was noted to be tachypneic and hypoxemic again in the ED.  On 5 L from baseline 2 L.  CTA PE protocol my review interpretation shows small right pleural effusion, atelectasis in the right base, otherwise clear lungs.   Procalcitonin was checked and negative.  Antibiotics were discontinued.  His hypoxemia returned to baseline.  EEG was within normal limits.  His mental status improved.  He was discharged home.  PMH: CAD, hyperlipidemia, diabetes Surgical history: Back surgery, VP shunt placement Family history: Father with CAD, mother with dementia, brother with lung cancer Social history: Remote smoking history, lives in [place], English as a foreign language / Pulmonary Flowsheets:   ACT:      No data to display          MMRC:     No data to display          Epworth:      No data to display          Tests:   FENO:  No results found for: "NITRICOXIDE"  PFT:     No data to display          WALK:     05/31/2022   12:55 PM 03/30/2022   11:57 AM  SIX MIN WALK  2 Minute Oxygen Saturation % 93 % 95 %  2 Minute HR 85 73  4 Minute Oxygen Saturation % 94 % 97 %  4 Minute HR 94 75  6 Minute Oxygen Saturation % 93 % 98 %  6 Minute HR 92 74    Imaging: Personally reviewed and as per EMR and discussion in this note No results found.  Lab Results: Personally reviewed CBC    Component Value Date/Time  WBC 7.8 05/03/2023 0529   RBC 4.64 05/03/2023 0529   HGB 14.7 05/03/2023 0529   HCT 46.8 05/03/2023 0529   PLT 150 05/03/2023 0529   MCV 100.9 (H) 05/03/2023 0529   MCH 31.7 05/03/2023 0529   MCHC 31.4 05/03/2023 0529   RDW 14.0 05/03/2023 0529   LYMPHSABS 0.3 (L) 05/03/2023 0529   MONOABS 0.6 05/03/2023 0529   EOSABS 0.0 05/03/2023 0529   BASOSABS 0.0 05/03/2023 0529    BMET    Component Value Date/Time   NA 140 05/03/2023 0529   K 4.7 05/03/2023 0529   CL 98 05/03/2023 0529   CO2 37 (H) 05/03/2023 0529   GLUCOSE 166 (H) 05/03/2023 0529   BUN 40 (H) 05/03/2023 0529   CREATININE 1.31 (H) 05/03/2023 0529   CREATININE 1.24 (H) 04/09/2018 0947   CALCIUM 8.5 (L) 05/03/2023 0529   GFRNONAA 54 (L) 05/03/2023 0529   GFRAA >60 07/13/2019 0622    BNP    Component  Value Date/Time   BNP 32.4 05/03/2023 0529    ProBNP No results found for: "PROBNP"  Specialty Problems       Pulmonary Problems   OSA on CPAP   Acute on chronic respiratory failure with hypoxia (HCC)   Chronic respiratory failure with hypoxia, on home O2 therapy (HCC) - 2 L/min prn per patient   HCAP (healthcare-associated pneumonia)   Acute hypoxemic respiratory failure (HCC)   OSA (obstructive sleep apnea)    Allergies  Allergen Reactions   Cefepime Other (See Comments)    toxic encephalopathy   Lipitor [Atorvastatin] Other (See Comments)    Hip pain    Immunization History  Administered Date(s) Administered   Influenza, High Dose Seasonal PF 02/20/2022   Moderna SARS-COV2 Booster Vaccination 06/24/2020, 11/30/2020   Pneumococcal Polysaccharide-23 06/12/2016   Tdap 02/19/2016   Zoster Recombinant(Shingrix) 03/07/2017, 05/18/2017    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 ALS 04/20/22   Counseling given: Not Answered Tobacco comments: Pt states he quit smoking 60 plus years ago and he  remembers smoking maybe a pack a day. ALS 04/20/22   Continue to not smoke  Outpatient Encounter Medications as of 07/04/2023  Medication Sig   aspirin EC 81 MG tablet Take 81 mg by mouth daily.    carvedilol (COREG) 12.5 MG tablet Take 1 tablet (12.5 mg total) by mouth 2 (two) times daily.   cetirizine (ZYRTEC) 10 MG tablet Take  10 mg by mouth daily.   cholecalciferol (VITAMIN D3) 25 MCG (1000 UNIT) tablet Take 2,000 Units by mouth daily.   clopidogrel (PLAVIX) 75 MG tablet Take 1 tablet by mouth once daily with breakfast   cyanocobalamin (VITAMIN B12) 1000 MCG tablet Take 1 tablet (1,000 mcg total) by mouth daily.   ezetimibe (ZETIA) 10 MG tablet Take 10 mg by mouth daily.   furosemide (LASIX) 20 MG tablet Take 1 tablet (20 mg total) by mouth daily. Please take extra tablet in the afternoon if developing lower extremity edema or> 3 pounds fluid retention/weight gain overnight   ipratropium-albuterol (DUONEB) 0.5-2.5 (3) MG/3ML SOLN Take 3 mLs by nebulization 2 (two) times daily.   metFORMIN (GLUCOPHAGE-XR) 500 MG 24 hr tablet Take 500 mg by mouth daily.   Multiple Vitamins-Minerals (PRESERVISION AREDS) CAPS Take 2 capsules by mouth daily.   pantoprazole (PROTONIX) 20 MG tablet Take 20 mg by mouth daily before breakfast.    rosuvastatin (CRESTOR) 10 MG tablet Take 1 tablet (10 mg total) by mouth daily.   No facility-administered encounter medications on file as of 07/04/2023.     Review of Systems  Review of Systems  N/a Physical Exam  BP 122/82 (BP Location: Left Arm, Patient Position: Sitting, Cuff Size: Normal)   Pulse 77   Temp 97.6 F (36.4 C) (Oral)   Ht 5\' 10"  (1.778 m)   Wt 195 lb (88.5 kg)   SpO2 97% Comment: 3L POC pulsed oxygen  BMI 27.98 kg/m   Wt Readings from Last 5 Encounters:  07/04/23 195 lb (88.5 kg)  04/27/23 190 lb 14.7 oz (86.6 kg)  01/17/23 191 lb (86.6 kg)  07/19/22 188 lb (85.3 kg)  07/11/22 188 lb (85.3 kg)    BMI Readings from Last 5  Encounters:  07/04/23 27.98 kg/m  04/27/23 27.39 kg/m  01/17/23 27.41 kg/m  07/19/22 26.98 kg/m  07/11/22 26.98 kg/m     Physical Exam General: Sitting in chair, in no acute distress Eyes: EOMI, no icterus Neck: Supple, no JVP appreciated sitting upright Pulmonary: Clear, diminished in the right base, normal work of breathing, on 3 L nasal cannula Cardiovascular: Regular rate and rhythm, no murmur Abdomen: Nondistended, bowel sounds present MSK: No synovitis, no joint effusion Neuro: Slow gait, no weakness Psych: Normal mood, full affect   Assessment & Plan:   Chronic hypoxemic respiratory failure: Transient in nature per description of family when monitoring oxygen saturations at home.  Review of recent chest imaging reveals small right-sided pleural effusion and atelectasis.  As well as chronic right hemidiaphragm elevation on review of serial imaging dating back years.  Suspect shunt physiology particular when sedentary but should improve with incentive spirometry, deep breath, mobilization, exercise.  Suspect this is an issue that will be ongoing and fluctuating oxygen requirements are likely to be noted.  Certainly complaining of oxygen needs during recent hospitalization as well as fluid overload.  Certainly will be exacerbated in setting of fluid overload.  Encouraged to continue diuretics.  Encouraged to continue monitoring oxygen saturation and titrate oxygen as needed for oxygen saturation goal of 88% or higher.  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  Unclear etiology.  No neck trauma, chest imaging, cervical spine surgery.  Idiopathic?  No further work-up recommended at this time.   Return in about 6 months (around 01/01/2024) for f/u Dr. Judeth Horn.   Karren Burly, MD 07/04/2023   This appointment required 41 minutes of patient care (this includes precharting, chart  review, review of results, face-to-face care, etc.).

## 2023-07-04 NOTE — Patient Instructions (Signed)
Nice to see you again  Continue using oxygen but goal would be 88% or above, ideally somewhere in the 90 to 92% range.  Keep checking her oxygen at home and if it remains above 95% certain oxygen for which you could increase little by little to find the least amount you need to keep the oxygen with 90 to 92%.  Both at rest and with exertion.  The flutter valve is what you blow into it creates positive pressure, back pressure to the lungs.  This helps keep the lungs popped open but also helps loosen mucus.  I do this a couple times a day, 10 puffs at a time.  Think about doing morning and afternoon.  The incentive spirometer is the 1 with a handle to do take a deep breath manage try to keep the blue disc up as high as possible.  I do this 2 or 3 times a day.  Keep it somewhere where you sit around, and office or couch near TV.  You can use as much as you would like.  The nebulizer machine close into the wall.  You could medicine or cartridges of medicines into it.  The medicine is called DuoNebs.  You can use this medicine as needed for cough, shortness of breath or wheezing.  Use every 6 hours as needed, if you are otherwise doing fine no need to use this.  No use for this scheduled around-the-clock.  Use as needed.  Return to clinic in 6 months or sooner as needed with Dr. Judeth Horn

## 2023-07-10 ENCOUNTER — Encounter (HOSPITAL_COMMUNITY): Payer: Self-pay | Admitting: Emergency Medicine

## 2023-07-10 ENCOUNTER — Emergency Department (HOSPITAL_COMMUNITY)
Admission: EM | Admit: 2023-07-10 | Discharge: 2023-07-11 | Disposition: A | Discharge status: Home / Self Care | Payer: Medicare Other | Attending: Emergency Medicine | Admitting: Emergency Medicine

## 2023-07-10 ENCOUNTER — Other Ambulatory Visit: Payer: Self-pay

## 2023-07-10 DIAGNOSIS — Z7982 Long term (current) use of aspirin: Secondary | ICD-10-CM | POA: Diagnosis not present

## 2023-07-10 DIAGNOSIS — W182XXA Fall in (into) shower or empty bathtub, initial encounter: Secondary | ICD-10-CM | POA: Insufficient documentation

## 2023-07-10 DIAGNOSIS — I509 Heart failure, unspecified: Secondary | ICD-10-CM | POA: Diagnosis not present

## 2023-07-10 DIAGNOSIS — I700 Atherosclerosis of aorta: Secondary | ICD-10-CM | POA: Diagnosis not present

## 2023-07-10 DIAGNOSIS — G912 (Idiopathic) normal pressure hydrocephalus: Secondary | ICD-10-CM | POA: Insufficient documentation

## 2023-07-10 DIAGNOSIS — R9389 Abnormal findings on diagnostic imaging of other specified body structures: Secondary | ICD-10-CM | POA: Diagnosis not present

## 2023-07-10 DIAGNOSIS — Z7984 Long term (current) use of oral hypoglycemic drugs: Secondary | ICD-10-CM | POA: Diagnosis not present

## 2023-07-10 DIAGNOSIS — M160 Bilateral primary osteoarthritis of hip: Secondary | ICD-10-CM | POA: Diagnosis not present

## 2023-07-10 DIAGNOSIS — Z79899 Other long term (current) drug therapy: Secondary | ICD-10-CM | POA: Diagnosis not present

## 2023-07-10 DIAGNOSIS — I517 Cardiomegaly: Secondary | ICD-10-CM | POA: Diagnosis not present

## 2023-07-10 DIAGNOSIS — Y92091 Bathroom in other non-institutional residence as the place of occurrence of the external cause: Secondary | ICD-10-CM | POA: Insufficient documentation

## 2023-07-10 DIAGNOSIS — G919 Hydrocephalus, unspecified: Secondary | ICD-10-CM | POA: Diagnosis not present

## 2023-07-10 DIAGNOSIS — Z982 Presence of cerebrospinal fluid drainage device: Secondary | ICD-10-CM | POA: Diagnosis not present

## 2023-07-10 DIAGNOSIS — W19XXXA Unspecified fall, initial encounter: Secondary | ICD-10-CM | POA: Diagnosis not present

## 2023-07-10 DIAGNOSIS — Z981 Arthrodesis status: Secondary | ICD-10-CM | POA: Diagnosis not present

## 2023-07-10 DIAGNOSIS — R0902 Hypoxemia: Secondary | ICD-10-CM | POA: Diagnosis not present

## 2023-07-10 DIAGNOSIS — Z043 Encounter for examination and observation following other accident: Secondary | ICD-10-CM | POA: Diagnosis not present

## 2023-07-10 DIAGNOSIS — S0990XA Unspecified injury of head, initial encounter: Secondary | ICD-10-CM | POA: Diagnosis not present

## 2023-07-10 DIAGNOSIS — R739 Hyperglycemia, unspecified: Secondary | ICD-10-CM | POA: Diagnosis not present

## 2023-07-10 DIAGNOSIS — E1165 Type 2 diabetes mellitus with hyperglycemia: Secondary | ICD-10-CM | POA: Insufficient documentation

## 2023-07-10 DIAGNOSIS — I672 Cerebral atherosclerosis: Secondary | ICD-10-CM | POA: Diagnosis not present

## 2023-07-10 LAB — BASIC METABOLIC PANEL
Anion gap: 14 (ref 5–15)
BUN: 31 mg/dL — ABNORMAL HIGH (ref 8–23)
CO2: 32 mmol/L (ref 22–32)
Calcium: 9.3 mg/dL (ref 8.9–10.3)
Chloride: 98 mmol/L (ref 98–111)
Creatinine, Ser: 1.38 mg/dL — ABNORMAL HIGH (ref 0.61–1.24)
GFR, Estimated: 51 mL/min — ABNORMAL LOW (ref >60)
Glucose, Bld: 318 mg/dL — ABNORMAL HIGH (ref 70–99)
Potassium: 4.4 mmol/L (ref 3.5–5.1)
Sodium: 144 mmol/L (ref 135–145)

## 2023-07-10 LAB — CBC
HCT: 48.2 % (ref 39.0–52.0)
Hemoglobin: 15 g/dL (ref 13.0–17.0)
MCH: 32.1 pg (ref 26.0–34.0)
MCHC: 31.1 g/dL (ref 30.0–36.0)
MCV: 103.2 fL — ABNORMAL HIGH (ref 80.0–100.0)
Platelets: 148 10*3/uL — ABNORMAL LOW (ref 150–400)
RBC: 4.67 MIL/uL (ref 4.22–5.81)
RDW: 13.6 % (ref 11.5–15.5)
WBC: 9.8 10*3/uL (ref 4.0–10.5)
nRBC: 0 % (ref 0.0–0.2)

## 2023-07-10 NOTE — ED Triage Notes (Signed)
Pt BIB by EMS for fall at a restaurant. On Plavix but denies head injury. Pt reports feeling "weak all over" prior to even. Pt states he had a dental crown placed this morning with no complications. On baseline oxygen for respiratory failure but denies wearing oxygen at the restaurant. Alert and oriented on arrival.

## 2023-07-10 NOTE — ED Provider Triage Note (Signed)
Emergency Medicine Provider Triage Evaluation Note  Jeremiah [name] , a 83 y.o. male  was evaluated in triage.  Pt complains of fall.  Patient states he was in the bathroom at a restaurant this evening when he was going to sit down on the commode to use the bathroom but started feel weak in both legs states he slowly fell to the ground.  Denies head injury or loss of consciousness.  He was unable to get up by himself.  2 other men were able to assist him.  He denies any preceding chest pain or shortness of breath or palpitations.  Denies lightheadedness or dizziness.  States "I just feel weak all over".  On baseline oxygen for respiratory failure but denies wearing oxygen at the restaurant.  States he is on Plavix.  Review of Systems  Positive: See above Negative: See above  Physical Exam  BP 135/63 (BP Location: Left Arm)   Pulse 86   Temp 98.7 F (37.1 C) (Oral)   Resp 18   SpO2 98%  Gen:   Awake, no distress   Resp:  Normal effort  MSK:   Moves extremities without difficulty  Other:    Medical Decision Making  Medically screening exam initiated at 8:45 PM.  Appropriate orders placed.  Junie Bame was informed that the remainder of the evaluation will be completed by another provider, this initial triage assessment does not replace that evaluation, and the importance of remaining in the ED until their evaluation is complete.  Work up started   Gareth Eagle, [place] 07/10/23 2047

## 2023-07-11 ENCOUNTER — Emergency Department (HOSPITAL_COMMUNITY): Payer: Medicare Other

## 2023-07-11 DIAGNOSIS — I517 Cardiomegaly: Secondary | ICD-10-CM | POA: Diagnosis not present

## 2023-07-11 DIAGNOSIS — Z982 Presence of cerebrospinal fluid drainage device: Secondary | ICD-10-CM | POA: Diagnosis not present

## 2023-07-11 DIAGNOSIS — I700 Atherosclerosis of aorta: Secondary | ICD-10-CM | POA: Diagnosis not present

## 2023-07-11 DIAGNOSIS — R9389 Abnormal findings on diagnostic imaging of other specified body structures: Secondary | ICD-10-CM | POA: Diagnosis not present

## 2023-07-11 DIAGNOSIS — M160 Bilateral primary osteoarthritis of hip: Secondary | ICD-10-CM | POA: Diagnosis not present

## 2023-07-11 DIAGNOSIS — S0990XA Unspecified injury of head, initial encounter: Secondary | ICD-10-CM | POA: Diagnosis not present

## 2023-07-11 DIAGNOSIS — Z043 Encounter for examination and observation following other accident: Secondary | ICD-10-CM | POA: Diagnosis not present

## 2023-07-11 DIAGNOSIS — Z981 Arthrodesis status: Secondary | ICD-10-CM | POA: Diagnosis not present

## 2023-07-11 DIAGNOSIS — I672 Cerebral atherosclerosis: Secondary | ICD-10-CM | POA: Diagnosis not present

## 2023-07-11 LAB — URINALYSIS, ROUTINE W REFLEX MICROSCOPIC
Bilirubin Urine: NEGATIVE
Glucose, UA: 150 mg/dL — AB
Hgb urine dipstick: NEGATIVE
Ketones, ur: NEGATIVE mg/dL
Leukocytes,Ua: NEGATIVE
Nitrite: NEGATIVE
Protein, ur: 30 mg/dL — AB
Specific Gravity, Urine: 1.026 (ref 1.005–1.030)
pH: 6 (ref 5.0–8.0)

## 2023-07-11 LAB — RESP PANEL BY RT-PCR (RSV, FLU A&B, COVID)  RVPGX2
Influenza A by PCR: NEGATIVE
Influenza B by PCR: NEGATIVE
Resp Syncytial Virus by PCR: NEGATIVE
SARS Coronavirus 2 by RT PCR: NEGATIVE

## 2023-07-11 NOTE — ED Notes (Signed)
Pts family requested that Jeremiah [name] be cancelled d/t them obtaining their own O2 tank. Pt VS were taken and family d/c him via private vehicle home

## 2023-07-11 NOTE — ED Provider Notes (Signed)
[place] EMERGENCY DEPARTMENT AT [facility] Provider Note   CSN: [id number] Arrival date & time: 07/10/23  2033     History  Chief Complaint  Patient presents with   Jeremiah [name] is a 83 y.o. male.  With extensive past medical history including NPH, AAA, chronic respiratory failure on home O2, heart failure, type 2 diabetes, and obstructive sleep apnea presents to the ED for fall.  Patient was in a restaurant last night with his wife and suffered a mechanical fall in the bathroom.  He landed on his bottom.  No head trauma or loss of consciousness.  Upon EMS arrival he was noted to be hypotensive.  He wears oxygen constantly at baseline but left his oxygen in the car while he was in [employer].  Otherwise he feels well without significant injuries or complaints at this time.  Denies headaches, chest pain, shortness of breath, abdominal pain, nausea vomiting and recent illness.  Additional history provided from daughter at bedside   Fall       Home Medications Prior to Admission medications   Medication Sig Start Date End Date Taking? Authorizing Provider  aspirin EC 81 MG tablet Take 81 mg by mouth daily.     [provider]  carvedilol (COREG) 12.5 MG tablet Take 1 tablet (12.5 mg total) by mouth 2 (two) times daily. 01/25/22   Almon Hercules, MD  cetirizine (ZYRTEC) 10 MG tablet Take 10 mg by mouth daily.    [provider]  cholecalciferol (VITAMIN D3) 25 MCG (1000 UNIT) tablet Take 2,000 Units by mouth daily.    [provider]  clopidogrel (PLAVIX) 75 MG tablet Take 1 tablet by mouth once daily with breakfast 05/01/23   [place], [name], MD  cyanocobalamin (VITAMIN B12) 1000 MCG tablet Take 1 tablet (1,000 mcg total) by mouth daily. 05/03/23   Elgergawy, Leana Roe, MD  ezetimibe (ZETIA) 10 MG tablet Take 10 mg by mouth daily. 05/02/21   [provider]  furosemide (LASIX) 20 MG tablet Take 1 tablet (20 mg total) by mouth  daily. Please take extra tablet in the afternoon if developing lower extremity edema or> 3 pounds fluid retention/weight gain overnight 05/03/23 05/02/24  Elgergawy, Leana Roe, MD  ipratropium-albuterol (DUONEB) 0.5-2.5 (3) MG/3ML SOLN Take 3 mLs by nebulization 2 (two) times daily. 05/03/23   Elgergawy, Leana Roe, MD  metFORMIN (GLUCOPHAGE-XR) 500 MG 24 hr tablet Take 500 mg by mouth daily. 01/11/22   [provider]  Multiple Vitamins-Minerals (PRESERVISION AREDS) CAPS Take 2 capsules by mouth daily.    [provider]  pantoprazole (PROTONIX) 20 MG tablet Take 20 mg by mouth daily before breakfast.  02/19/19   [provider]  rosuvastatin (CRESTOR) 10 MG tablet Take 1 tablet (10 mg total) by mouth daily. 01/25/22 04/28/23  Almon Hercules, MD      Allergies    Cefepime and Lipitor [atorvastatin]    Review of Systems   Review of Systems  Physical Exam Updated Vital Signs BP (!) 147/81   Pulse 79   Temp 97.7 F (36.5 C)   Resp 18   SpO2 94%  Physical Exam Vitals and nursing note reviewed.  HENT:     Head: Normocephalic and atraumatic.  Eyes:     Pupils: Pupils are equal, round, and reactive to light.  Cardiovascular:     Rate and Rhythm: Normal rate and regular rhythm.  Pulmonary:     Effort:  Pulmonary effort is normal.     Breath sounds: Normal breath sounds.  Abdominal:     Palpations: Abdomen is soft.     Tenderness: There is no abdominal tenderness.  Musculoskeletal:        General: No tenderness.     Cervical back: Neck supple. No tenderness.     Comments: No midline tenderness step-off deformity of back 5 out of 5 motor strength bilateral upper and lower extremities Sensation intact to light touch throughout   Skin:    General: Skin is warm and dry.  Neurological:     Mental Status: He is alert.     Comments: Oriented to self and time only  Psychiatric:        Mood and Affect: Mood normal.     ED Results / Procedures / Treatments    Labs (all labs ordered are listed, but only abnormal results are displayed) Labs Reviewed  BASIC METABOLIC PANEL - Abnormal; Notable for the following components:      Result Value   Glucose, Bld 318 (*)    BUN 31 (*)    Creatinine, Ser 1.38 (*)    GFR, Estimated 51 (*)    All other components within normal limits  CBC - Abnormal; Notable for the following components:   MCV 103.2 (*)    Platelets 148 (*)    All other components within normal limits  URINALYSIS, ROUTINE W REFLEX MICROSCOPIC - Abnormal; Notable for the following components:   Glucose, UA 150 (*)    Protein, ur 30 (*)    Bacteria, UA RARE (*)    All other components within normal limits  RESP PANEL BY RT-PCR (RSV, FLU A&B, COVID)  [id number]  CBG MONITORING, ED    EKG EKG Interpretation Date/Time:  Tuesday July 10 2023 21:01:41 EST Ventricular Rate:  85 PR Interval:  180 QRS Duration:  98 QT Interval:  374 QTC Calculation: 445 R Axis:   35  Text Interpretation: Normal sinus rhythm Low voltage QRS Incomplete right bundle branch block Nonspecific ST and T wave abnormality Abnormal ECG When compared with ECG of 27-Apr-2023 11:27, PREVIOUS ECG IS PRESENT Confirmed by Estelle June [phone]) on 07/11/2023 8:35:38 AM  Radiology CT Head Wo Contrast Result Date: 07/11/2023 CLINICAL DATA:  Trauma, fall at restaurant, diffuse weakness. EXAM: CT HEAD WITHOUT CONTRAST CT CERVICAL SPINE WITHOUT CONTRAST TECHNIQUE: Multidetector CT imaging of the head and cervical spine was performed following the standard protocol without intravenous contrast. Multiplanar CT image reconstructions of the cervical spine were also generated. RADIATION DOSE REDUCTION: This exam was performed according to the departmental dose-optimization program which includes automated exposure control, adjustment of the mA and/or kV according to patient size and/or use of iterative reconstruction technique. COMPARISON:  CT head 01/31/2022, CTA head and neck  01/31/2022, CT cervical spine 07/05/2019 FINDINGS: CT HEAD FINDINGS Brain: No acute intracranial hemorrhage. No CT evidence of acute infarct. Redemonstrated right parietal approach ventriculostomy catheter which traverses the right lateral ventricle with tip terminating in the body of the left lateral ventricle similar to prior. Redemonstrated hypoattenuating extra-axial collections. Anterior left frontal collection measures up to 8 mm in oblique transverse diameter, previously 7 mm. Additional right frontal extra-axial collection measures 11 mm in maximum coronal dimension, previously 9 mm. Mass effect on the adjacent right frontal lobe is similar to prior without midline shift. The basilar cisterns are patent. Ventricles: Stable size and configuration of the ventricles. Vascular: Atherosclerotic calcifications of the intracranial carotid arteries. No hyperdense vessel.  Skull: No acute or aggressive finding. Orbits: Orbits are symmetric. Sinuses: Focal mucosal thickening in the right posterior ethmoid air cells. Other: Trace right mastoid effusion. CT CERVICAL SPINE FINDINGS Alignment: Cervical lordosis is maintained. Trace anterolisthesis of C4 on C5 and C6 on C7 again noted. No facet subluxation or dislocation. Skull base and vertebrae: No acute compression fracture or displaced fracture in the cervical spine. Similar mild height loss of the C7 vertebral body anteriorly. No suspicious lytic or blastic osseous lesion. Small bilateral cervical ribs noted at C7. Soft tissues and spinal canal: Similar appearance of soft tissue and calcification along the dorsal aspect of the dens suggestive of degenerative pannus. The paraspinal musculature is unremarkable. Partially visualized shunt catheter within the right posterior neck. Disc levels: Severe disc space narrowing at C5-6. Additional mild disc space narrowing at multiple levels. Disc bulges at multiple levels in the cervical spine without evidence of high-grade  osseous spinal canal stenosis. Facet arthrosis most pronounced on the left at C3-4 through C5-6. Upper chest: Negative. IMPRESSION: CT HEAD: No CT evidence of acute intracranial abnormality. Bilateral subdural collections again noted, slightly increased compared to 01/31/2022 possibly related to CSF shunting. No midline shift. Right parietal approach ventriculostomy catheter in place. Stable size and configuration of the ventricles. CT CERVICAL SPINE: No acute fracture or traumatic malalignment of the cervical spine. Electronically Signed   By: Emily Filbert M.D.   On: 07/11/2023 10:45   CT Cervical Spine Wo Contrast Result Date: 07/11/2023 CLINICAL DATA:  Trauma, fall at restaurant, diffuse weakness. EXAM: CT HEAD WITHOUT CONTRAST CT CERVICAL SPINE WITHOUT CONTRAST TECHNIQUE: Multidetector CT imaging of the head and cervical spine was performed following the standard protocol without intravenous contrast. Multiplanar CT image reconstructions of the cervical spine were also generated. RADIATION DOSE REDUCTION: This exam was performed according to the departmental dose-optimization program which includes automated exposure control, adjustment of the mA and/or kV according to patient size and/or use of iterative reconstruction technique. COMPARISON:  CT head 01/31/2022, CTA head and neck 01/31/2022, CT cervical spine 07/05/2019 FINDINGS: CT HEAD FINDINGS Brain: No acute intracranial hemorrhage. No CT evidence of acute infarct. Redemonstrated right parietal approach ventriculostomy catheter which traverses the right lateral ventricle with tip terminating in the body of the left lateral ventricle similar to prior. Redemonstrated hypoattenuating extra-axial collections. Anterior left frontal collection measures up to 8 mm in oblique transverse diameter, previously 7 mm. Additional right frontal extra-axial collection measures 11 mm in maximum coronal dimension, previously 9 mm. Mass effect on the adjacent right  frontal lobe is similar to prior without midline shift. The basilar cisterns are patent. Ventricles: Stable size and configuration of the ventricles. Vascular: Atherosclerotic calcifications of the intracranial carotid arteries. No hyperdense vessel. Skull: No acute or aggressive finding. Orbits: Orbits are symmetric. Sinuses: Focal mucosal thickening in the right posterior ethmoid air cells. Other: Trace right mastoid effusion. CT CERVICAL SPINE FINDINGS Alignment: Cervical lordosis is maintained. Trace anterolisthesis of C4 on C5 and C6 on C7 again noted. No facet subluxation or dislocation. Skull base and vertebrae: No acute compression fracture or displaced fracture in the cervical spine. Similar mild height loss of the C7 vertebral body anteriorly. No suspicious lytic or blastic osseous lesion. Small bilateral cervical ribs noted at C7. Soft tissues and spinal canal: Similar appearance of soft tissue and calcification along the dorsal aspect of the dens suggestive of degenerative pannus. The paraspinal musculature is unremarkable. Partially visualized shunt catheter within the right posterior neck. Disc levels: Severe disc  space narrowing at C5-6. Additional mild disc space narrowing at multiple levels. Disc bulges at multiple levels in the cervical spine without evidence of high-grade osseous spinal canal stenosis. Facet arthrosis most pronounced on the left at C3-4 through C5-6. Upper chest: Negative. IMPRESSION: CT HEAD: No CT evidence of acute intracranial abnormality. Bilateral subdural collections again noted, slightly increased compared to 01/31/2022 possibly related to CSF shunting. No midline shift. Right parietal approach ventriculostomy catheter in place. Stable size and configuration of the ventricles. CT CERVICAL SPINE: No acute fracture or traumatic malalignment of the cervical spine. Electronically Signed   By: Emily Filbert M.D.   On: 07/11/2023 10:45   DG Pelvis 1-2 Views Result Date:  07/11/2023 CLINICAL DATA:  Fall. EXAM: PELVIS - 1-2 VIEW COMPARISON:  Abdominal radiographs dated 01/27/2022. CT abdomen/pelvis dated 06/06/2021. FINDINGS: There is no evidence of pelvic fracture or diastasis. The femoral heads are seated within the acetabula. Mild-to-moderate degenerative changes of the bilateral hips. Sacroiliac joints and pubic symphysis are anatomically aligned. Lower lumbar fusion hardware in place. 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 IMPRESSION: No acute osseous abnormality. Electronically Signed   By: Hart Robinsons M.D.   On: 07/11/2023 09:17   DG Chest Portable 1 View Result Date: 07/11/2023 CLINICAL DATA:  Fall. EXAM: PORTABLE CHEST 1 VIEW COMPARISON:  Chest radiograph dated 04/30/2023. FINDINGS: Stable cardiomegaly. Aortic atherosclerosis. Persistent asymmetric elevation of the right hemidiaphragm. Similar linear scarring/atelectasis at the left lung base. No focal consolidation, sizeable pleural effusion, or pneumothorax. VP shunt catheter tubing is noted extending along the right chest wall. No acute osseous abnormality. IMPRESSION: 1. No acute findings in the chest. 2. Stable cardiomegaly. Electronically Signed   By: Hart Robinsons M.D.   On: 07/11/2023 09:15    Procedures Procedures    Medications Ordered in ED Medications - No data to display  ED Course/ Medical Decision Making/ A&P Clinical Course as of 07/11/23 1115  Wed Jul 11, 2023  1111 No traumatic findings on CT head C-spine or chest pelvis x-rays.  Stable NPH with VP shunt in place.  Labs show renal function close to baseline.  Hyperglycemia in the 300s.  I discussed this with the patient's daughter who states he has been compliant with ezetimibe and metformin.  She will follow-up with his primary care doctor regarding tighter control of blood sugars at home.  He otherwise continues to feel well would be appropriate for discharge at this time [MP]    Clinical Course User  Index [MP] Royanne Foots, DO                                 Medical Decision Making 83 year old male with history as above presenting after mechanical fall in public restroom last night.  Landed on his bottom.  No head trauma or LOC.  Was initially a little hypotensive with EMS.  Afebrile and normotensive now.  Well-appearing.  No external evidence of trauma however given that he is on Plavix and is unclear on the details of the fall we will obtain CT head cervical spine to evaluate for traumatic injury along with chest x-ray and pelvis x-ray.  Will obtain basic laboratory workup including CBC metabolic panel UA to look for underlying cause of weakness and fall although he has plenty of chronic medical issues that would contribute to his weakness including normal pressure hydrocephalus and chronic respiratory failure on O2.  He did not  have his oxygen with him in the restroom last night.  Will obtain EKG to look for any evidence of dysrhythmia.  No reported chest pain.  Low suspicion for ACS at this time.  Suspect if his workup is negative here he can return home   Amount and/or Complexity of Data Reviewed Labs: ordered. Radiology: ordered.           Final Clinical Impression(s) / ED Diagnoses Final diagnoses:  Fall, initial encounter  Normal pressure hydrocephalus [facility])    Rx / DC Orders ED Discharge Orders     None         Royanne Foots, DO 07/11/23 1115

## 2023-07-11 NOTE — Discharge Instructions (Signed)
Osiris was seen in the emergency department for after fall last night His blood work looked okay for the most part but his blood sugar was elevated in the 300s Is important that he continues taking medications for diabetes and follows up with his primary care doctor regarding persistently elevated blood glucose levels He should have his sugar rechecked by his at home nurse No traumatic findings on the CAT scan of his head neck or chest x-ray or pelvis x-ray Please follow-up with his primary care doctor we will be for reevaluation next return to the emergency department for trouble breathing repeated falls or any other concerns

## 2023-07-12 DIAGNOSIS — J9601 Acute respiratory failure with hypoxia: Secondary | ICD-10-CM | POA: Diagnosis not present

## 2023-07-15 DIAGNOSIS — J9601 Acute respiratory failure with hypoxia: Secondary | ICD-10-CM | POA: Diagnosis not present

## 2023-07-16 DIAGNOSIS — E785 Hyperlipidemia, unspecified: Secondary | ICD-10-CM | POA: Diagnosis not present

## 2023-07-16 DIAGNOSIS — E1165 Type 2 diabetes mellitus with hyperglycemia: Secondary | ICD-10-CM | POA: Diagnosis not present

## 2023-07-16 DIAGNOSIS — I130 Hypertensive heart and chronic kidney disease with heart failure and stage 1 through stage 4 chronic kidney disease, or unspecified chronic kidney disease: Secondary | ICD-10-CM | POA: Diagnosis not present

## 2023-07-16 DIAGNOSIS — J9621 Acute and chronic respiratory failure with hypoxia: Secondary | ICD-10-CM | POA: Diagnosis not present

## 2023-07-23 DIAGNOSIS — C61 Malignant neoplasm of prostate: Secondary | ICD-10-CM | POA: Diagnosis not present

## 2023-07-23 DIAGNOSIS — E785 Hyperlipidemia, unspecified: Secondary | ICD-10-CM | POA: Diagnosis not present

## 2023-07-23 DIAGNOSIS — E538 Deficiency of other specified B group vitamins: Secondary | ICD-10-CM | POA: Diagnosis not present

## 2023-07-27 ENCOUNTER — Other Ambulatory Visit: Payer: Self-pay | Admitting: Cardiology

## 2023-07-28 NOTE — Progress Notes (Signed)
 My response to the following coding query: Please clarify the medical condition(s) necessitating the order for SARS,RSV,Flu A&B   Evaluate for viral illness

## 2023-07-30 DIAGNOSIS — E1165 Type 2 diabetes mellitus with hyperglycemia: Secondary | ICD-10-CM | POA: Diagnosis not present

## 2023-07-30 DIAGNOSIS — R82998 Other abnormal findings in urine: Secondary | ICD-10-CM | POA: Diagnosis not present

## 2023-07-30 DIAGNOSIS — I130 Hypertensive heart and chronic kidney disease with heart failure and stage 1 through stage 4 chronic kidney disease, or unspecified chronic kidney disease: Secondary | ICD-10-CM | POA: Diagnosis not present

## 2023-07-30 DIAGNOSIS — J9621 Acute and chronic respiratory failure with hypoxia: Secondary | ICD-10-CM | POA: Diagnosis not present

## 2023-07-30 DIAGNOSIS — I10 Essential (primary) hypertension: Secondary | ICD-10-CM | POA: Diagnosis not present

## 2023-07-30 DIAGNOSIS — Z0000 Encounter for general adult medical examination without abnormal findings: Secondary | ICD-10-CM | POA: Diagnosis not present

## 2023-08-09 ENCOUNTER — Other Ambulatory Visit: Payer: Self-pay | Admitting: Cardiology

## 2023-08-09 DIAGNOSIS — J9601 Acute respiratory failure with hypoxia: Secondary | ICD-10-CM | POA: Diagnosis not present

## 2023-09-11 ENCOUNTER — Other Ambulatory Visit (HOSPITAL_COMMUNITY): Payer: Self-pay

## 2023-09-11 DIAGNOSIS — G4733 Obstructive sleep apnea (adult) (pediatric): Secondary | ICD-10-CM | POA: Diagnosis not present

## 2023-09-12 DIAGNOSIS — J9601 Acute respiratory failure with hypoxia: Secondary | ICD-10-CM | POA: Diagnosis not present

## 2023-09-21 DIAGNOSIS — H353131 Nonexudative age-related macular degeneration, bilateral, early dry stage: Secondary | ICD-10-CM | POA: Diagnosis not present

## 2023-09-21 DIAGNOSIS — E113293 Type 2 diabetes mellitus with mild nonproliferative diabetic retinopathy without macular edema, bilateral: Secondary | ICD-10-CM | POA: Diagnosis not present

## 2023-09-21 DIAGNOSIS — Z961 Presence of intraocular lens: Secondary | ICD-10-CM | POA: Diagnosis not present

## 2023-09-26 DIAGNOSIS — L570 Actinic keratosis: Secondary | ICD-10-CM | POA: Diagnosis not present

## 2023-10-09 DIAGNOSIS — J9601 Acute respiratory failure with hypoxia: Secondary | ICD-10-CM | POA: Diagnosis not present

## 2023-10-11 DIAGNOSIS — G4733 Obstructive sleep apnea (adult) (pediatric): Secondary | ICD-10-CM | POA: Diagnosis not present

## 2023-10-12 DIAGNOSIS — J9601 Acute respiratory failure with hypoxia: Secondary | ICD-10-CM | POA: Diagnosis not present

## 2023-11-01 DIAGNOSIS — E1165 Type 2 diabetes mellitus with hyperglycemia: Secondary | ICD-10-CM | POA: Diagnosis not present

## 2023-11-09 DIAGNOSIS — J9601 Acute respiratory failure with hypoxia: Secondary | ICD-10-CM | POA: Diagnosis not present

## 2023-11-11 DIAGNOSIS — G4733 Obstructive sleep apnea (adult) (pediatric): Secondary | ICD-10-CM | POA: Diagnosis not present

## 2023-11-12 DIAGNOSIS — J9601 Acute respiratory failure with hypoxia: Secondary | ICD-10-CM | POA: Diagnosis not present

## 2023-12-09 DIAGNOSIS — J9601 Acute respiratory failure with hypoxia: Secondary | ICD-10-CM | POA: Diagnosis not present

## 2023-12-12 DIAGNOSIS — J9601 Acute respiratory failure with hypoxia: Secondary | ICD-10-CM | POA: Diagnosis not present

## 2023-12-17 DIAGNOSIS — I7143 Infrarenal abdominal aortic aneurysm, without rupture: Secondary | ICD-10-CM | POA: Diagnosis not present

## 2023-12-17 DIAGNOSIS — G4733 Obstructive sleep apnea (adult) (pediatric): Secondary | ICD-10-CM | POA: Diagnosis not present

## 2024-01-12 DIAGNOSIS — J9601 Acute respiratory failure with hypoxia: Secondary | ICD-10-CM | POA: Diagnosis not present

## 2024-01-31 ENCOUNTER — Other Ambulatory Visit: Payer: Self-pay | Admitting: Cardiology

## 2024-02-12 DIAGNOSIS — J9601 Acute respiratory failure with hypoxia: Secondary | ICD-10-CM | POA: Diagnosis not present

## 2024-03-11 ENCOUNTER — Other Ambulatory Visit: Payer: Self-pay | Admitting: Cardiology

## 2024-03-27 ENCOUNTER — Other Ambulatory Visit: Payer: Self-pay | Admitting: Cardiology

## 2024-04-10 DIAGNOSIS — E1151 Type 2 diabetes mellitus with diabetic peripheral angiopathy without gangrene: Secondary | ICD-10-CM | POA: Diagnosis not present

## 2024-04-10 DIAGNOSIS — I130 Hypertensive heart and chronic kidney disease with heart failure and stage 1 through stage 4 chronic kidney disease, or unspecified chronic kidney disease: Secondary | ICD-10-CM | POA: Diagnosis not present

## 2024-04-10 DIAGNOSIS — Z23 Encounter for immunization: Secondary | ICD-10-CM | POA: Diagnosis not present

## 2024-05-05 NOTE — Progress Notes (Unsigned)
 Cardiology Office Note:    Date:  05/07/2024   ID:  Jeremiah [name], DOB 01-01-1941, MRN [id number]  PCP:  Jeremiah Anes, MD   [facility] Providers Cardiologist:  Jeremiah Copen, MD     Referring MD: Jeremiah Anes, MD   Chief Complaint  Patient presents with   chronic respiratory failure.   Coronary Artery Disease    History of Present Illness:    Jeremiah [name] is a 83 y.o. male seen for follow up CAD. He has a hx of normal pressure hydrocephalus s/p VP shunt, chronic respiratory failure on home O2, hypertension, hyperlipidemia, and DM2.    Patient was seen in the hospital on 01/21/2022 for evaluation of chest pain.  Troponin was mildly elevated, however patient had uncontrolled blood pressure in the 190s.  Troponin eventually peaked at 823 before trending back down.  Echocardiogram on 01/21/2022 showed EF 55 to 60%, hypokinesis of the mid RV with relative apical sparing, mild MR, mild to moderate AI, mildly dilated aortic root.  Subsequent cardiac catheterization performed on 01/24/2022 showed 50% proximal LAD, 50% D1, 15% proximal to mid RCA lesion, 90% RPDA lesion treated with Onyx frontier 2.5 x 12 mm DES.  Due to history of intolerance of Lipitor in the past, it was recommended to consider PCSK9 inhibitor as outpatient.  He was discharged on aspirin  and Plavix .  Carvedilol  increased to 12.5 mg twice a day.  Unfortunately patient returned back to the hospital 2 days after he was discharged with shortness of breath, generalized weakness, lethargy and a nonproductive cough.  O2 saturation was in the 80s on home 2 L oxygen .  CTA negative for PE but showed bilateral lower lobe infiltrate.  He was started on IV antibiotic for hospital acquired pneumonia.   Patient was treated for acute on chronic hypoxic respiratory failure.  On 9/12, patient developed acute encephalopathy of unclear etiology.  Code stroke was called, extensive imaging was done revealing, EEG showed nonspecific  abnormality.  Neurology felt patient had cefepime  induced encephalopathy.  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 He is followed by Neuro for severe sleep apnea.   He was admitted in Dec 2024 with acute respiratory failure and hypoxia. Ended up having a right heart cath at request of pulmonary. PAP mean of 25 mm Hg. PCWP 13 mm Hg. Cardiac output preserved. Echo showed normal LV function with moderate LVH.   Has not had cardiac follow up since then.   On follow up today he is doing OK. He is still SOB. On chronic oxygen . Activity is very limited. No swelling or change in weight. No chest pain. Notes insulin  was reduced in Nov. Crestor  was stopped since there was concern this was contributing to muscle weakness.    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   S/P radiation therapy 04/23/13 - 05/29/14   Prostate/seminal vesicles, external beam 4500 cGy in 25 sessions   Seasonal allergies    Skin cancer    scalp  Small bowel obstruction (HCC) 06/10/2016   Wears glasses     Past Surgical History:  Procedure Laterality Date   BACK SURGERY  2009   CARDIAC CATHETERIZATION     COLONOSCOPY W/ BIOPSIES AND POLYPECTOMY     CORONARY STENT INTERVENTION N/A 01/24/2022   Procedure: CORONARY STENT INTERVENTION;  Surgeon: Roxie Kreeger M, MD;  Location: [facility] INVASIVE CV LAB;  Service: Cardiovascular;  Laterality: N/A;   CYSTOSCOPY W/ URETERAL STENT PLACEMENT Left 05/12/2021   Procedure: CYSTOSCOPY WITH RETROGRADE PYELOGRAM/ LEFT URETERAL STENT PLACEMENT.;  Surgeon: Selma Donnice SAUNDERS, MD;  Location: [facility] OR;  Service: Urology;   Laterality: Left;   HYDROCELE EXCISION  1963   LEFT HEART CATH AND CORONARY ANGIOGRAPHY N/A 03/14/2019   Procedure: LEFT HEART CATH AND CORONARY ANGIOGRAPHY;  Surgeon: Verlin Lonni BIRCH, MD;  Location: [facility] INVASIVE CV LAB;  Service: Cardiovascular;  Laterality: N/A;   LEFT HEART CATH AND CORONARY ANGIOGRAPHY N/A 01/24/2022   Procedure: LEFT HEART CATH AND CORONARY ANGIOGRAPHY;  Surgeon: Ilya Neely M, MD;  Location: [facility] INVASIVE CV LAB;  Service: Cardiovascular;  Laterality: N/A;   PROSTATE BIOPSY  2013, 2014, 01/2014   Gleason 7   RADIOACTIVE SEED IMPLANT N/A 07/01/2014   Procedure: RADIOACTIVE SEED IMPLANT;  Surgeon: Alm GORMAN Fragmin, MD;  Location: [facility];  Service: Urology;  Laterality: N/A;  Jeremiah PORTABLE   RIGHT HEART CATH N/A 05/01/2023   Procedure: RIGHT HEART CATH;  Surgeon: Anner Alm ORN, MD;  Location: [facility] INVASIVE CV LAB;  Service: Cardiovascular;  Laterality: N/A;   TONSILLECTOMY     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   Procedure: SHUNT INSERTION VENTRICULAR-PERITONEAL;  Surgeon: Joshua Alm GORMAN, MD;  Location: [facility] OR;  Service: Neurosurgery;  Laterality: N/A;  SHUNT INSERTION VENTRICULAR-PERITONEAL    Current Medications: Current Meds  Medication Sig   aspirin  EC 81 MG tablet Take 81 mg by mouth daily.    carvedilol  (COREG ) 12.5 MG tablet Take 1 tablet (12.5 mg total) by mouth 2 (two) times daily.   cetirizine (ZYRTEC) 10 MG tablet Take 10 mg by mouth daily.   cholecalciferol  (VITAMIN D3) 25 MCG (1000 UNIT) tablet Take 2,000 Units by mouth daily.   cyanocobalamin  (VITAMIN B12) 1000 MCG tablet Take 1 tablet (1,000 mcg total) by mouth daily.   ezetimibe  (ZETIA ) 10 MG tablet Take 10 mg by mouth daily.   Fish Oil-Coenzyme Q10 (FISH OIL PLUS CO Q-10 PO) Take 1 capsule by mouth daily.   furosemide  (LASIX ) 20 MG tablet Take 1 tablet (20 mg total) by mouth daily. Please take extra tablet in the afternoon if developing lower extremity edema or> 3 pounds  fluid retention/weight gain overnight   HUMALOG KWIKPEN 100 UNIT/ML KwikPen Inject 5 Units into the skin daily.   ipratropium-albuterol  (DUONEB) 0.5-2.5 (3) MG/3ML SOLN Take 3 mLs by nebulization 2 (two) times daily.   LANTUS  SOLOSTAR 100 UNIT/ML Solostar Pen Inject 20 Units into the skin daily.   metFORMIN (GLUCOPHAGE-XR) 500 MG 24 hr tablet Take 500 mg by mouth daily.   Multiple Vitamins-Minerals (PRESERVISION AREDS) CAPS Take 2 capsules by mouth daily.   pantoprazole  (PROTONIX ) 20 MG tablet Take 20 mg by mouth daily before breakfast.    [DISCONTINUED] clopidogrel  (PLAVIX ) 75 MG tablet Take 1 tablet by mouth once daily with breakfast     Allergies:   Cefepime  and Lipitor [atorvastatin]   Social History   Socioeconomic History   Marital status: Married    Spouse name: Not on file   Number  of children: 2   Years of education: 13   Highest education level: Some college, no degree  Occupational History   Occupation: retired    Comment: all equities trader  Tobacco Use   Smoking status: Former    Current packs/day: 0.00    Average packs/day: 1.0 packs/day    Types: Cigarettes    Quit date: 03/10/1961    Years since quitting: 63.2   Smokeless tobacco: Never   Tobacco comments:    Pt states he quit smoking 60 plus years ago and he remembers smoking maybe a pack a day. ALS 04/20/22  Vaping Use   Vaping status: Never Used  Substance and Sexual Activity   Alcohol use: Not Currently    Alcohol/week: 1.0 standard drink of alcohol    Types: 1 Glasses of wine per week    Comment: rare alcohol   Drug use: No   Sexual activity: Not on file  Other Topics Concern   Not on file  Social History Narrative   Rare caffeine use    Social Drivers of Health   Tobacco Use: Medium Risk (05/07/2024)   Patient History    Smoking Tobacco Use: Former    Smokeless Tobacco Use: Never    Passive Exposure: Not on Actuary Strain: Not on file  Food Insecurity: No Food Insecurity  (05/03/2023)   Hunger Vital Sign    Worried About Running Out of Food in the Last Year: Never true    Ran Out of Food in the Last Year: Never true  Transportation Needs: No Transportation Needs (05/03/2023)   PRAPARE - Administrator, Civil Service (Medical): No    Lack of Transportation (Non-Medical): No  Physical Activity: Not on file  Stress: Not on file  Social Connections: Not on file  Depression (PHQ2-9): Low Risk (06/21/2022)   Depression (PHQ2-9)    PHQ-2 Score: 0  Alcohol Screen: Not on file  Housing: Low Risk (05/03/2023)   Housing Stability Vital Sign    Unable to Pay for Housing in the Last Year: No    Number of Times Moved in the Last Year: 0    Homeless in the Last Year: No  Utilities: Not At Risk (04/28/2023)   AHC Utilities    Threatened with loss of utilities: No  Health Literacy: Not on file     Family History: The patient's family history includes Cancer in his father and paternal uncle; Dementia (age of onset: 43) in his mother; Diabetes in his father; Heart attack (age of onset: 16) in his father; Lung cancer in his brother.  ROS:   Please see the history of present illness.     All other systems reviewed and are negative.  EKGs/Labs/Other Studies Reviewed:    The following studies were reviewed today:  Echo 01/21/2022  1. Left ventricular ejection fraction, by estimation, is 55 to 60%. The  left ventricle has normal function. Left ventricular endocardial border  not optimally defined to evaluate regional wall motion. Left ventricular  diastolic parameters are  indeterminate.   2. Right ventricular systolic function is mildly reduced with hypokinesis  of the mid ventricle and relative apical sparing. The right ventricular  size is mildly enlarged. IVC not assessed, cannot calculate RVSP.   3. Dilated coronary sinus.   4. The mitral valve is normal in structure. Mild mitral valve  regurgitation. No evidence of mitral stenosis.   5. The  aortic valve is tricuspid. Aortic valve regurgitation is mild  to  moderate. No aortic stenosis is present.   6. Aortic dilatation noted. There is mild dilatation of the aortic root,  measuring 42 mm.    Cath 01/24/2022   Prox LAD lesion is 50% stenosed.   1st Diag lesion is 50% stenosed.   Prox RCA to Mid RCA lesion is 15% stenosed.   RPDA lesion is 90% stenosed.   A drug-eluting stent was successfully placed using a STENT ONYX FRONTIER 2.5X12.   Post intervention, there is a 0% residual stenosis.   LV end diastolic pressure is normal.   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 Anticipate DC in am   Echo 04/28/23:   IMPRESSIONS     1. Left ventricular ejection fraction, by estimation, is 60 to 65%. The  left ventricle has normal function. The left ventricle has no regional  wall motion abnormalities. There is moderate left ventricular hypertrophy.  Left ventricular diastolic  parameters are consistent with Grade I diastolic dysfunction (impaired  relaxation).   2. Right ventricular systolic function is mildly reduced. The right  ventricular size is normal.   3. The mitral valve is normal in structure. No evidence of mitral valve  regurgitation. No evidence of mitral stenosis.   4. The aortic valve is tricuspid. There is mild calcification of the  aortic valve. There is mild thickening of the aortic valve. Aortic valve  regurgitation is trivial. Aortic valve sclerosis is present, with no  evidence of aortic valve stenosis.   5. Aortic dilatation noted. There is mild dilatation of the ascending  aorta, measuring 41 mm.   6. The inferior vena cava is normal in size with greater than 50%  respiratory variability, suggesting right atrial pressure of 3 mmHg.    Right heart cath 05/01/23:  RIGHT HEART CATH   Conclusion  Right Heart CATH #S/Pressures   Right Heart Pressures  Hemodynamic findings consistent with mild pulmonary hypertension. PAP-mean 33/17-25 mmHg; PCWP 13 mmHg; AOP 127/80 mmHg  Ao sat 95%; PA sat 69%. Cardiac Output-Index Collie): 5.25-2.56-borderline  Right Atrium Right atrial pressure is elevated. Mean RAP 10 mmHg  Right Ventricle RVP-EDP 37/5-12 mmHg        RECOMMENDATIONS Return to nurse here for ongoing care.  He appears to be adequately diuresed.     Recent Labs: 07/10/2023: BUN 31; Creatinine, Ser 1.38; Hemoglobin 15.0; Platelets 148; Potassium 4.4; Sodium 144  Recent Lipid Panel    Component Value Date/Time   CHOL 140 03/14/2022 1113   TRIG 181 (H) 03/14/2022 1113   HDL 44 03/14/2022 1113   CHOLHDL 3.2 03/14/2022 1113   CHOLHDL 4.8 01/21/2022 0200   VLDL 53 (H) 01/21/2022 0200   LDLCALC 66 03/14/2022 1113   Dated 05/30/22: cholesterol 135, triglycerides 189. HDL 43, LDL 54. A1c 6.9%.   Dated 07/23/23: cholesterol 207, triglycerides 288, HDL 46, LDL 103. CMET and TSH normal.  Dated 11/01/23 A1c 6.6%.  Dateedd 04/10/24: A1c 6.5%.   Risk Assessment/Calculations:           Physical Exam:    VS:  BP 122/78   Pulse 82   Ht 5' 10 (1.778 m)   Wt 200 lb 9.6 oz (91 kg)   SpO2 93%   BMI 28.78 kg/m        Wt Readings from Last 3 Encounters:  05/07/24 200 lb 9.6 oz (91 kg)  07/04/23 195 lb (88.5  kg)  04/27/23 190 lb 14.7 oz (86.6 kg)     GEN: overweight, well developed in no acute distress on oxygen  HEENT: Normal NECK: No JVD; No carotid bruits LYMPHATICS: No lymphadenopathy CARDIAC: RRR, no murmurs, rubs, gallops RESPIRATORY:  bilateral crackles ABDOMEN: Soft, non-tender, non-distended MUSCULOSKELETAL:  No edema; No deformity  SKIN: Warm and dry NEUROLOGIC:  Alert and oriented x 3 PSYCHIATRIC:  Normal affect   ASSESSMENT:    1. Coronary artery disease involving native coronary artery of native heart without angina pectoris   2. Primary hypertension   3. Hyperlipidemia LDL goal <70   4. Chronic respiratory  failure with hypoxia, on home O2 therapy (HCC) - 2 L/min prn per patient       PLAN:    In order of problems listed above:  CAD s/p stenting with DES to RPDA with 2.5 x 12 mm Onyx frontier drug-eluting stent on 01/24/2022.  He has no significant angina. May discontinue Plavix . Continue ASA.  Plan follow up in one year  Hyperlipidemia: was on Crestor  in the past but concerned this was resulting in muscle weakness so discontinued. On Zetia  alone.   Hypertension: Blood pressure is well controlled.   Chronic respiratory failure: On home oxygen .  5.   DM per PCP  6.  Severe sleep apnea - intolerant to CPAP in past           Medication Adjustments/Labs and Tests Ordered: Current medicines are reviewed at length with the patient today.  Concerns regarding medicines are outlined above.  No orders of the defined types were placed in this encounter.  No orders of the defined types were placed in this encounter.   Patient Instructions  Medication Instructions:  Stop Plavix  Continue all other medications *If you need a refill on your cardiac medications before your next appointment, please call your pharmacy*  Lab Work: None ordered  Testing/Procedures: None ordered  Follow-Up: At [facility], you and your health needs are our priority.  As part of our continuing mission to provide you with exceptional heart care, our providers are all part of one team.  This team includes your primary Cardiologist (physician) and Advanced Practice Providers or APPs (Physician Assistants and Nurse Practitioners) who all work together to provide you with the care you need, when you need it.  Your next appointment:  1 year    Call in August to schedule Dec appointment    Provider:  Dr.Aleasha Fregeau   We recommend signing up for the patient portal called MyChart.  Sign up information is provided on this After Visit Summary.  MyChart is used to connect with patients for Virtual Visits  (Telemedicine).  Patients are able to view lab/test results, encounter notes, upcoming appointments, etc.  Non-urgent messages can be sent to your provider as well.   To learn more about what you can do with MyChart, go to [email or web address].     Signed, Kregg Cihlar, MD  05/07/2024 11:56 AM    [facility]

## 2024-05-07 ENCOUNTER — Encounter: Payer: Self-pay | Admitting: Cardiology

## 2024-05-07 ENCOUNTER — Ambulatory Visit: Attending: Cardiology | Admitting: Cardiology

## 2024-05-07 VITALS — BP 122/78 | HR 82 | Ht 70.0 in | Wt 200.6 lb

## 2024-05-07 DIAGNOSIS — E785 Hyperlipidemia, unspecified: Secondary | ICD-10-CM | POA: Diagnosis not present

## 2024-05-07 DIAGNOSIS — I2510 Atherosclerotic heart disease of native coronary artery without angina pectoris: Secondary | ICD-10-CM | POA: Diagnosis not present

## 2024-05-07 DIAGNOSIS — J9611 Chronic respiratory failure with hypoxia: Secondary | ICD-10-CM

## 2024-05-07 DIAGNOSIS — Z9981 Dependence on supplemental oxygen: Secondary | ICD-10-CM | POA: Diagnosis not present

## 2024-05-07 DIAGNOSIS — I10 Essential (primary) hypertension: Secondary | ICD-10-CM | POA: Diagnosis not present

## 2024-05-07 NOTE — Patient Instructions (Addendum)
 Medication Instructions:  Stop Plavix  Continue all other medications *If you need a refill on your cardiac medications before your next appointment, please call your pharmacy*  Lab Work: None ordered  Testing/Procedures: None ordered  Follow-Up: At [facility], you and your health needs are our priority.  As part of our continuing mission to provide you with exceptional heart care, our providers are all part of one team.  This team includes your primary Cardiologist (physician) and Advanced Practice Providers or APPs (Physician Assistants and Nurse Practitioners) who all work together to provide you with the care you need, when you need it.  Your next appointment:  1 year    Call in August to schedule Dec appointment    Provider:  Dr.Jordan   We recommend signing up for the patient portal called MyChart.  Sign up information is provided on this After Visit Summary.  MyChart is used to connect with patients for Virtual Visits (Telemedicine).  Patients are able to view lab/test results, encounter notes, upcoming appointments, etc.  Non-urgent messages can be sent to your provider as well.   To learn more about what you can do with MyChart, go to [email or web address].

## 2024-05-13 ENCOUNTER — Inpatient Hospital Stay (HOSPITAL_COMMUNITY)
Admission: EM | Admit: 2024-05-13 | Discharge: 2024-05-21 | DRG: 682 | Disposition: A | Discharge status: Home Health | Attending: Internal Medicine | Admitting: Internal Medicine

## 2024-05-13 ENCOUNTER — Other Ambulatory Visit: Payer: Self-pay

## 2024-05-13 ENCOUNTER — Emergency Department (HOSPITAL_COMMUNITY)

## 2024-05-13 DIAGNOSIS — I10 Essential (primary) hypertension: Secondary | ICD-10-CM | POA: Diagnosis not present

## 2024-05-13 DIAGNOSIS — G912 (Idiopathic) normal pressure hydrocephalus: Secondary | ICD-10-CM | POA: Diagnosis not present

## 2024-05-13 DIAGNOSIS — Z8546 Personal history of malignant neoplasm of prostate: Secondary | ICD-10-CM

## 2024-05-13 DIAGNOSIS — G4733 Obstructive sleep apnea (adult) (pediatric): Secondary | ICD-10-CM | POA: Diagnosis present

## 2024-05-13 DIAGNOSIS — Z1152 Encounter for screening for COVID-19: Secondary | ICD-10-CM

## 2024-05-13 DIAGNOSIS — Z801 Family history of malignant neoplasm of trachea, bronchus and lung: Secondary | ICD-10-CM

## 2024-05-13 DIAGNOSIS — J9811 Atelectasis: Secondary | ICD-10-CM | POA: Diagnosis present

## 2024-05-13 DIAGNOSIS — E1122 Type 2 diabetes mellitus with diabetic chronic kidney disease: Secondary | ICD-10-CM | POA: Diagnosis present

## 2024-05-13 DIAGNOSIS — Z982 Presence of cerebrospinal fluid drainage device: Secondary | ICD-10-CM

## 2024-05-13 DIAGNOSIS — W19XXXA Unspecified fall, initial encounter: Principal | ICD-10-CM | POA: Diagnosis present

## 2024-05-13 DIAGNOSIS — E785 Hyperlipidemia, unspecified: Secondary | ICD-10-CM | POA: Diagnosis present

## 2024-05-13 DIAGNOSIS — G9340 Encephalopathy, unspecified: Secondary | ICD-10-CM

## 2024-05-13 DIAGNOSIS — I4891 Unspecified atrial fibrillation: Secondary | ICD-10-CM | POA: Diagnosis present

## 2024-05-13 DIAGNOSIS — N1831 Chronic kidney disease, stage 3a: Secondary | ICD-10-CM | POA: Diagnosis present

## 2024-05-13 DIAGNOSIS — E1159 Type 2 diabetes mellitus with other circulatory complications: Secondary | ICD-10-CM | POA: Diagnosis not present

## 2024-05-13 DIAGNOSIS — Z860100 Personal history of colon polyps, unspecified: Secondary | ICD-10-CM

## 2024-05-13 DIAGNOSIS — Z8042 Family history of malignant neoplasm of prostate: Secondary | ICD-10-CM

## 2024-05-13 DIAGNOSIS — E860 Dehydration: Secondary | ICD-10-CM | POA: Diagnosis present

## 2024-05-13 DIAGNOSIS — J9621 Acute and chronic respiratory failure with hypoxia: Secondary | ICD-10-CM | POA: Diagnosis present

## 2024-05-13 DIAGNOSIS — Z91199 Patient's noncompliance with other medical treatment and regimen due to unspecified reason: Secondary | ICD-10-CM

## 2024-05-13 DIAGNOSIS — Z881 Allergy status to other antibiotic agents status: Secondary | ICD-10-CM

## 2024-05-13 DIAGNOSIS — E873 Alkalosis: Secondary | ICD-10-CM | POA: Diagnosis present

## 2024-05-13 DIAGNOSIS — C61 Malignant neoplasm of prostate: Secondary | ICD-10-CM | POA: Diagnosis not present

## 2024-05-13 DIAGNOSIS — I2510 Atherosclerotic heart disease of native coronary artery without angina pectoris: Secondary | ICD-10-CM | POA: Diagnosis present

## 2024-05-13 DIAGNOSIS — Z87891 Personal history of nicotine dependence: Secondary | ICD-10-CM

## 2024-05-13 DIAGNOSIS — Z7982 Long term (current) use of aspirin: Secondary | ICD-10-CM

## 2024-05-13 DIAGNOSIS — Z923 Personal history of irradiation: Secondary | ICD-10-CM

## 2024-05-13 DIAGNOSIS — I714 Abdominal aortic aneurysm, without rupture, unspecified: Secondary | ICD-10-CM | POA: Diagnosis present

## 2024-05-13 DIAGNOSIS — N179 Acute kidney failure, unspecified: Principal | ICD-10-CM | POA: Diagnosis present

## 2024-05-13 DIAGNOSIS — J9611 Chronic respiratory failure with hypoxia: Secondary | ICD-10-CM

## 2024-05-13 DIAGNOSIS — S62614A Displaced fracture of proximal phalanx of right ring finger, initial encounter for closed fracture: Secondary | ICD-10-CM | POA: Diagnosis present

## 2024-05-13 DIAGNOSIS — I5032 Chronic diastolic (congestive) heart failure: Secondary | ICD-10-CM | POA: Diagnosis present

## 2024-05-13 DIAGNOSIS — E119 Type 2 diabetes mellitus without complications: Secondary | ICD-10-CM

## 2024-05-13 DIAGNOSIS — E782 Mixed hyperlipidemia: Secondary | ICD-10-CM | POA: Diagnosis not present

## 2024-05-13 DIAGNOSIS — Z7984 Long term (current) use of oral hypoglycemic drugs: Secondary | ICD-10-CM

## 2024-05-13 DIAGNOSIS — J9622 Acute and chronic respiratory failure with hypercapnia: Secondary | ICD-10-CM | POA: Diagnosis present

## 2024-05-13 DIAGNOSIS — I130 Hypertensive heart and chronic kidney disease with heart failure and stage 1 through stage 4 chronic kidney disease, or unspecified chronic kidney disease: Secondary | ICD-10-CM | POA: Diagnosis present

## 2024-05-13 DIAGNOSIS — Z794 Long term (current) use of insulin: Secondary | ICD-10-CM

## 2024-05-13 DIAGNOSIS — Z9981 Dependence on supplemental oxygen: Secondary | ICD-10-CM

## 2024-05-13 DIAGNOSIS — Z888 Allergy status to other drugs, medicaments and biological substances status: Secondary | ICD-10-CM

## 2024-05-13 DIAGNOSIS — G9341 Metabolic encephalopathy: Secondary | ICD-10-CM | POA: Diagnosis present

## 2024-05-13 DIAGNOSIS — N400 Enlarged prostate without lower urinary tract symptoms: Secondary | ICD-10-CM | POA: Diagnosis present

## 2024-05-13 DIAGNOSIS — E870 Hyperosmolality and hypernatremia: Secondary | ICD-10-CM | POA: Diagnosis present

## 2024-05-13 DIAGNOSIS — Z955 Presence of coronary angioplasty implant and graft: Secondary | ICD-10-CM

## 2024-05-13 DIAGNOSIS — Z79899 Other long term (current) drug therapy: Secondary | ICD-10-CM

## 2024-05-13 DIAGNOSIS — Z833 Family history of diabetes mellitus: Secondary | ICD-10-CM

## 2024-05-13 DIAGNOSIS — T502X5A Adverse effect of carbonic-anhydrase inhibitors, benzothiadiazides and other diuretics, initial encounter: Secondary | ICD-10-CM | POA: Diagnosis present

## 2024-05-13 DIAGNOSIS — Z820 Family history of epilepsy and other diseases of the nervous system: Secondary | ICD-10-CM

## 2024-05-13 DIAGNOSIS — Z8249 Family history of ischemic heart disease and other diseases of the circulatory system: Secondary | ICD-10-CM

## 2024-05-13 DIAGNOSIS — Z85828 Personal history of other malignant neoplasm of skin: Secondary | ICD-10-CM

## 2024-05-13 DIAGNOSIS — I272 Pulmonary hypertension, unspecified: Secondary | ICD-10-CM | POA: Diagnosis present

## 2024-05-13 LAB — COMPREHENSIVE METABOLIC PANEL WITH GFR
ALT: 21 U/L (ref 0–44)
AST: 31 U/L (ref 15–41)
Albumin: 3.5 g/dL (ref 3.5–5.0)
Alkaline Phosphatase: 76 U/L (ref 38–126)
Anion gap: 8 (ref 5–15)
BUN: 56 mg/dL — ABNORMAL HIGH (ref 8–23)
CO2: 36 mmol/L — ABNORMAL HIGH (ref 22–32)
Calcium: 8.9 mg/dL (ref 8.9–10.3)
Chloride: 102 mmol/L (ref 98–111)
Creatinine, Ser: 2.46 mg/dL — ABNORMAL HIGH (ref 0.61–1.24)
GFR, Estimated: 25 mL/min — ABNORMAL LOW
Glucose, Bld: 150 mg/dL — ABNORMAL HIGH (ref 70–99)
Potassium: 4.9 mmol/L (ref 3.5–5.1)
Sodium: 145 mmol/L (ref 135–145)
Total Bilirubin: 0.4 mg/dL (ref 0.0–1.2)
Total Protein: 6.3 g/dL — ABNORMAL LOW (ref 6.5–8.1)

## 2024-05-13 LAB — I-STAT VENOUS BLOOD GAS, ED
Acid-Base Excess: 10 mmol/L — ABNORMAL HIGH (ref 0.0–2.0)
Acid-Base Excess: 10 mmol/L — ABNORMAL HIGH (ref 0.0–2.0)
Bicarbonate: 39.2 mmol/L — ABNORMAL HIGH (ref 20.0–28.0)
Bicarbonate: 38.6 mmol/L — ABNORMAL HIGH (ref 20.0–28.0)
Calcium, Ion: 1.14 mmol/L — ABNORMAL LOW (ref 1.15–1.40)
Calcium, Ion: 1.14 mmol/L — ABNORMAL LOW (ref 1.15–1.40)
HCT: 36 % — ABNORMAL LOW (ref 39.0–52.0)
HCT: 38 % — ABNORMAL LOW (ref 39.0–52.0)
Hemoglobin: 12.2 g/dL — ABNORMAL LOW (ref 13.0–17.0)
Hemoglobin: 12.9 g/dL — ABNORMAL LOW (ref 13.0–17.0)
O2 Saturation: 99 %
O2 Saturation: 85 %
Potassium: 4.6 mmol/L (ref 3.5–5.1)
Potassium: 4.7 mmol/L (ref 3.5–5.1)
Sodium: 146 mmol/L — ABNORMAL HIGH (ref 135–145)
Sodium: 146 mmol/L — ABNORMAL HIGH (ref 135–145)
TCO2: 41 mmol/L — ABNORMAL HIGH (ref 22–32)
TCO2: 41 mmol/L — ABNORMAL HIGH (ref 22–32)
pCO2, Ven: 76.1 mmHg (ref 44–60)
pCO2, Ven: 75.9 mmHg (ref 44–60)
pH, Ven: 7.32 (ref 7.25–7.43)
pH, Ven: 7.315 (ref 7.25–7.43)
pO2, Ven: 155 mmHg — ABNORMAL HIGH (ref 32–45)
pO2, Ven: 57 mmHg — ABNORMAL HIGH (ref 32–45)

## 2024-05-13 LAB — CBC WITH DIFFERENTIAL/PLATELET
Abs Immature Granulocytes: 0.14 K/uL — ABNORMAL HIGH (ref 0.00–0.07)
Basophils Absolute: 0 K/uL (ref 0.0–0.1)
Basophils Relative: 0 %
Eosinophils Absolute: 0.1 K/uL (ref 0.0–0.5)
Eosinophils Relative: 2 %
HCT: 41.4 % (ref 39.0–52.0)
Hemoglobin: 12.1 g/dL — ABNORMAL LOW (ref 13.0–17.0)
Immature Granulocytes: 2 %
Lymphocytes Relative: 9 %
Lymphs Abs: 0.7 K/uL (ref 0.7–4.0)
MCH: 31.3 pg (ref 26.0–34.0)
MCHC: 29.2 g/dL — ABNORMAL LOW (ref 30.0–36.0)
MCV: 107 fL — ABNORMAL HIGH (ref 80.0–100.0)
Monocytes Absolute: 0.7 K/uL (ref 0.1–1.0)
Monocytes Relative: 9 %
Neutro Abs: 6.6 K/uL (ref 1.7–7.7)
Neutrophils Relative %: 78 %
Platelets: 148 K/uL — ABNORMAL LOW (ref 150–400)
RBC: 3.87 MIL/uL — ABNORMAL LOW (ref 4.22–5.81)
RDW: 14.2 % (ref 11.5–15.5)
WBC: 8.4 K/uL (ref 4.0–10.5)
nRBC: 0 % (ref 0.0–0.2)

## 2024-05-13 LAB — RESP PANEL BY RT-PCR (RSV, FLU A&B, COVID)  RVPGX2
Influenza A by PCR: NEGATIVE
Influenza B by PCR: NEGATIVE
Resp Syncytial Virus by PCR: NEGATIVE
SARS Coronavirus 2 by RT PCR: NEGATIVE

## 2024-05-13 LAB — CBG MONITORING, ED: Glucose-Capillary: 122 mg/dL — ABNORMAL HIGH (ref 70–99)

## 2024-05-13 LAB — PRO BRAIN NATRIURETIC PEPTIDE: Pro Brain Natriuretic Peptide: 465 pg/mL — ABNORMAL HIGH

## 2024-05-13 MED ORDER — PANTOPRAZOLE SODIUM 20 MG PO TBEC
20.0000 mg | DELAYED_RELEASE_TABLET | Freq: Every day | ORAL | Status: DC
  Administered 2024-05-14 – 2024-05-21 (×8): 20 mg via ORAL
  Filled 2024-05-13 (×5): qty 1

## 2024-05-13 MED ORDER — INSULIN ASPART 100 UNIT/ML IJ SOLN
0.0000 [IU] | Freq: Three times a day (TID) | INTRAMUSCULAR | Status: DC
  Administered 2024-05-16: 5 [IU] via SUBCUTANEOUS
  Administered 2024-05-16 – 2024-05-17 (×2): 2 [IU] via SUBCUTANEOUS
  Administered 2024-05-17: 3 [IU] via SUBCUTANEOUS
  Administered 2024-05-18: 2 [IU] via SUBCUTANEOUS
  Administered 2024-05-18: 3 [IU] via SUBCUTANEOUS
  Administered 2024-05-18 – 2024-05-19 (×2): 2 [IU] via SUBCUTANEOUS
  Administered 2024-05-19: 5 [IU] via SUBCUTANEOUS
  Administered 2024-05-20 (×2): 3 [IU] via SUBCUTANEOUS
  Administered 2024-05-21: 2 [IU] via SUBCUTANEOUS
  Filled 2024-05-13 (×3): qty 2
  Filled 2024-05-13: qty 3
  Filled 2024-05-13: qty 2
  Filled 2024-05-13: qty 5
  Filled 2024-05-13: qty 1

## 2024-05-13 MED ORDER — EZETIMIBE 10 MG PO TABS
10.0000 mg | ORAL_TABLET | Freq: Every day | ORAL | Status: DC
  Administered 2024-05-14 – 2024-05-21 (×8): 10 mg via ORAL
  Filled 2024-05-13 (×5): qty 1

## 2024-05-13 MED ORDER — SODIUM CHLORIDE 0.9% FLUSH
3.0000 mL | Freq: Two times a day (BID) | INTRAVENOUS | Status: DC
  Administered 2024-05-13 – 2024-05-20 (×14): 3 mL via INTRAVENOUS

## 2024-05-13 MED ORDER — CARVEDILOL 12.5 MG PO TABS
12.5000 mg | ORAL_TABLET | Freq: Two times a day (BID) | ORAL | Status: DC
  Administered 2024-05-13 – 2024-05-21 (×16): 12.5 mg via ORAL
  Filled 2024-05-13 (×11): qty 1

## 2024-05-13 MED ORDER — ENOXAPARIN SODIUM 30 MG/0.3ML IJ SOSY
30.0000 mg | PREFILLED_SYRINGE | Freq: Every day | INTRAMUSCULAR | Status: DC
  Administered 2024-05-14 – 2024-05-16 (×3): 30 mg via SUBCUTANEOUS
  Filled 2024-05-13 (×3): qty 0.3

## 2024-05-13 MED ORDER — ACETAMINOPHEN 325 MG PO TABS: 650.0000 mg | ORAL_TABLET | Freq: Four times a day (QID) | ORAL | Status: DC | PRN

## 2024-05-13 MED ORDER — ACETAMINOPHEN 650 MG RE SUPP: 650.0000 mg | Freq: Four times a day (QID) | RECTAL | Status: DC | PRN

## 2024-05-13 MED ORDER — SODIUM CHLORIDE 0.9 % IV SOLN: Freq: Once | INTRAVENOUS | Status: AC

## 2024-05-13 MED ORDER — INSULIN GLARGINE 100 UNIT/ML ~~LOC~~ SOLN
10.0000 [IU] | Freq: Every day | SUBCUTANEOUS | Status: DC
  Administered 2024-05-14: 10 [IU] via SUBCUTANEOUS
  Filled 2024-05-13 (×3): qty 0.1

## 2024-05-13 MED ORDER — POLYETHYLENE GLYCOL 3350 17 G PO PACK: 17.0000 g | PACK | Freq: Every day | ORAL | Status: DC | PRN

## 2024-05-13 MED ORDER — ASPIRIN 81 MG PO TBEC
81.0000 mg | DELAYED_RELEASE_TABLET | Freq: Every day | ORAL | Status: DC
  Administered 2024-05-14 – 2024-05-21 (×8): 81 mg via ORAL
  Filled 2024-05-13 (×5): qty 1

## 2024-05-13 NOTE — ED Notes (Signed)
 Patient transported to CT

## 2024-05-13 NOTE — ED Triage Notes (Signed)
 Pt BIBA from home for fall yesterday - bruising in the right hand and back. Witnessed by caretaker who didn't notice head injury.   1-1.5 weeks of SOB and CHF exasperation. Non compliant with CPAP/Lasix 

## 2024-05-13 NOTE — ED Notes (Signed)
Called RT to place patient on Bipap

## 2024-05-13 NOTE — ED Notes (Signed)
 This NT observed on monitor pt O2 sat dropped to 86%, upon entering room pt had removed nasal cannula and fidgiting with pulse ox on hand. Nasal cannula reapplied and pt's needs reassessed for comfort.

## 2024-05-13 NOTE — ED Provider Notes (Signed)
 " [place] EMERGENCY DEPARTMENT AT [facility] Provider Note   CSN: [id number] Arrival date & time: 05/13/24  1154     Patient presents with: No chief complaint on file.   Jeremiah [name] is a 83 y.o. male.   HPI Patient with multiple medical problems presents from facility with concern for change in interactivity. The patient himself is smiling, not insightful about his presentation, history is obtained by EMS. Seemingly staff at the facility noted the patient has been noncompliant with his nocturnal BiPAP, had a fall within the past few days, and is less interactive than usual.  Patient also noted to have more abdominal protuberance, possibly. EMS reports patient was tachypneic, hypoxic, requiring supplemental oxygen , 4 L in transport.     Prior to Admission medications  Medication Sig Start Date End Date Taking? Authorizing Provider  aspirin  EC 81 MG tablet Take 81 mg by mouth daily.     [provider]  carvedilol  (COREG ) 12.5 MG tablet Take 1 tablet (12.5 mg total) by mouth 2 (two) times daily. 01/25/22   Gonfa, Taye T, MD  cetirizine (ZYRTEC) 10 MG tablet Take 10 mg by mouth daily.    [provider]  cholecalciferol  (VITAMIN D3) 25 MCG (1000 UNIT) tablet Take 2,000 Units by mouth daily.    [provider]  cyanocobalamin  (VITAMIN B12) 1000 MCG tablet Take 1 tablet (1,000 mcg total) by mouth daily. 05/03/23   Elgergawy, Brayton RAMAN, MD  ezetimibe  (ZETIA ) 10 MG tablet Take 10 mg by mouth daily. 05/02/21   [provider]  Fish Oil-Coenzyme Q10 (FISH OIL PLUS CO Q-10 PO) Take 1 capsule by mouth daily.    [provider]  furosemide  (LASIX ) 20 MG tablet Take 1 tablet (20 mg total) by mouth daily. Please take extra tablet in the afternoon if developing lower extremity edema or> 3 pounds fluid retention/weight gain overnight 05/03/23 05/07/24  Elgergawy, Brayton RAMAN, MD  HUMALOG KWIKPEN 100 UNIT/ML KwikPen Inject 5 Units into the skin  daily. 07/17/23   [provider]  ipratropium-albuterol  (DUONEB) 0.5-2.5 (3) MG/3ML SOLN Take 3 mLs by nebulization 2 (two) times daily. 05/03/23   Elgergawy, Brayton RAMAN, MD  LANTUS  SOLOSTAR 100 UNIT/ML Solostar Pen Inject 20 Units into the skin daily.    [provider]  metFORMIN (GLUCOPHAGE-XR) 500 MG 24 hr tablet Take 500 mg by mouth daily. 01/11/22   [provider]  Multiple Vitamins-Minerals (PRESERVISION AREDS) CAPS Take 2 capsules by mouth daily.    [provider]  pantoprazole  (PROTONIX ) 20 MG tablet Take 20 mg by mouth daily before breakfast.  02/19/19   [provider]    Allergies: Cefepime  and Lipitor [atorvastatin]    Review of Systems  Updated Vital Signs BP 125/77 (BP Location: Left Arm)   Pulse 75   Temp 98.3 F (36.8 C) (Oral)   Resp (!) 24   SpO2 92%   Physical Exam Vitals and nursing note reviewed.  Constitutional:      General: He is not in acute distress.    Appearance: He is well-developed.  HENT:     Head: Normocephalic and atraumatic.  Eyes:     Conjunctiva/sclera: Conjunctivae normal.  Cardiovascular:     Rate and Rhythm: Regular rhythm. Tachycardia present.  Pulmonary:     Effort: Tachypnea and accessory muscle usage present.     Breath sounds: Decreased air movement present. No stridor.  Abdominal:     General: There is no distension.  Skin:  General: Skin is warm and dry.  Neurological:     Mental Status: He is alert and oriented to person, place, and time.     Cranial Nerves: No dysarthria.     Motor: Atrophy present.     (all labs ordered are listed, but only abnormal results are displayed) Labs Reviewed - No data to display  EKG: None  Radiology: No results found.   Procedures   Medications Ordered in the ED - No data to display                                  Medical Decision Making Elderly male with multiple medical issues including heart failure, encephalopathy previously,  hypertension, hyperlipidemia presents with multiple concerns from facility.  Line concern for altered mental status, increased work of breathing, fall.  Broad differential including worsening electrolytes, worsening heart failure, intracranial abnormality, fracture. Cardiac 110 A-fib abnormal Pulse ox 99% with supplemental oxygen  abnormal  Amount and/or Complexity of Data Reviewed Independent Historian: EMS External Data Reviewed: notes. Labs: ordered. Decision-making details documented in ED Course. Radiology: ordered and independent interpretation performed. Decision-making details documented in ED Course. ECG/medicine tests: ordered and independent interpretation performed. Decision-making details documented in ED Course.  Risk Prescription drug management. Decision regarding hospitalization. Diagnosis or treatment significantly limited by social determinants of health.   Daughter now present.  She notes the patient does wear oxygen  24/7, we discussed his history of pulmonary hypertension.  She corroborates the patient has not been wearing his nightly BiPAP/CPAP.  I reviewed the patient labs, discussing them with patient's daughter who is at bedside.  Patient with evidence for acute kidney injury, essentially normal BNP value, chest x-ray and CT without obvious pneumonia.  Patient's VP shunt seems to be appropriately draining into the right pleural cavity, mentation is at baseline according to the patient's daughter, he is afebrile.  Head CT, neck CT both noncontributory aside from demonstration of VP shunt.  On reviewing patient's chart, his last hospitalization included evidence for worsening renal dysfunction after overdiuresis, patient is in somewhat similar condition currently. Patient will continue receiving fluid resuscitation judiciously, will require admission for further monitoring, management, acute kidney injury.  CRITICAL CARE Performed by: Lamar Salen Total critical care  time: 35 minutes Critical care time was exclusive of separately billable procedures and treating other patients. Critical care was necessary to treat or prevent imminent or life-threatening deterioration. Critical care was time spent personally by me on the following activities: development of treatment plan with patient and/or surrogate as well as nursing, discussions with consultants, evaluation of patient's response to treatment, examination of patient, obtaining history from patient or surrogate, ordering and performing treatments and interventions, ordering and review of laboratory studies, ordering and review of radiographic studies, pulse oximetry and re-evaluation of patient's condition.   Final diagnoses:  Fall, initial encounter  AKI (acute kidney injury)    ED Discharge Orders     None          Salen Lamar, MD 05/13/24 1520  "

## 2024-05-13 NOTE — ED Notes (Signed)
 Secure chat sent to provider regarding the need for Blood gas and xray of right hand. Provider responded that he will be down to assess him

## 2024-05-13 NOTE — ED Notes (Signed)
" °   05/13/24 1623  Vitals  Pulse Rate 63  ECG Heart Rate 65  Resp 18  MEWS COLOR  MEWS Score Color Green  Oxygen  Therapy  SpO2 (!) 88 %  O2 Device Nasal Cannula  O2 Flow Rate (L/min) 5 L/min  MEWS Score  MEWS Temp 0  MEWS Systolic 0  MEWS Pulse 0  MEWS RR 0  MEWS LOC 0  MEWS Score 0   Secure chat sent to provider requesting Blood gas and to place pt on Bipap "

## 2024-05-13 NOTE — H&P (Addendum)
 " History and Physical   Jeremiah [name] FMW:[id number] DOB: Nov 18, 1940 DOA: 05/13/2024  PCP: Shayne Anes, MD   Patient coming from: Home  Chief Complaint: Fall, shortness of breath  HPI: Jeremiah [name] is a 83 y.o. 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  History obtained assistance of chart review and family.  Patient reports 1 to 1-1/2 weeks of shortness of breath with concern for CHF flare.  Patient has had issues with nonadherence to his Lasix  in past and CPAP/Bipap long term.  EMS was called and patient noted to require 4 L of oxygen  which is increased from his baseline 2 L.  Brought to the ED for further evaluation.  Patient denies specific complaints at this time.  States that he has had issues sleeping and feeling tired.  ED Course: Vital signs in the ED notable for blood pressure in the 120s-140 systolic, respiratory rate in the 20s.  Requiring 4 to 5 L to maintain saturations.  Lab workup included CMP with bicarb 36, BUN 56, creatinine elevated to 2.46 and baseline 1.3, glucose 150, protein 6.3.  CBC with hemoglobin 12.1 which is down from previous baseline at the beginning of the year of 14-15.  Platelets 148.  proBNP mildly elevated at 465.  Respiratory panel for flu COVID and RSV pending.  Chest x-ray showed chronic low lung volumes with right hemidiaphragm elevation and small right pleural effusion but no acute abnormality.  CT chest showed small right pleural effusion similar ventriculopleural catheter.  CT head showed no acute normality and chronic shunt in place.  CT C-spine showed no acute abnormality and CT evidence of epidural venous engorgement possibly suggesting over shunting.  Patient received IV fluids at 125 cc an hour  in the ED.  Review of Systems: As per HPI otherwise all other systems reviewed and are negative.  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   S/P radiation therapy 04/23/13 - 05/29/14   Prostate/seminal vesicles, external beam 4500 cGy in 25 sessions   Seasonal allergies    Skin cancer    scalp   Small bowel obstruction (HCC) 06/10/2016   Wears glasses     Past Surgical History:  Procedure Laterality Date   BACK SURGERY  2009   CARDIAC CATHETERIZATION     COLONOSCOPY W/ BIOPSIES AND POLYPECTOMY     CORONARY STENT INTERVENTION N/A 01/24/2022   Procedure: CORONARY STENT INTERVENTION;  Surgeon: Jordan, [name], MD;  Location: [facility] IN[facility]ASIVE [facility];  Service: Cardiovascular;  Laterality: N/A;   CYSTOSCOPY W/ URETERAL STENT PLACEMENT Left 05/12/2021   Procedure: CYSTOSCOPY WITH RETROGRADE PYELOGRAM/ LEFT URETERAL STENT PLACEMENT.;  Surgeon: Selma Donnice SAUNDERS, MD;  Location: [facility] OR;  Service: Urology;  Laterality: Left;   HYDROCELE EXCISION  1963   LEFT HEART CATH AND CORONARY ANGIOGRAPHY N/A 03/14/2019   Procedure: LEFT HEART CATH  AND CORONARY ANGIOGRAPHY;  Surgeon: Verlin Lonni BIRCH, MD;  Location: [facility];  Service: Cardiovascular;  Laterality: N/A;   LEFT HEART CATH AND CORONARY ANGIOGRAPHY N/A 01/24/2022   Procedure: LEFT HEART CATH AND CORONARY ANGIOGRAPHY;  Surgeon: Jordan, [name], MD;  Location: [facility] INVASIVE CV LAB;  Service: Cardiovascular;  Laterality: N/A;   PROSTATE BIOPSY  2013, 2014, 01/2014   Gleason 7   RADIOACTIVE SEED IMPLANT N/A 07/01/2014   Procedure: RADIOACTIVE SEED IMPLANT;  Surgeon: Alm GORMAN Fragmin, MD;   Location: [facility];  Service: Urology;  Laterality: N/A;  DR PORTABLE   RIGHT HEART CATH N/A 05/01/2023   Procedure: RIGHT HEART CATH;  Surgeon: Anner Alm ORN, MD;  Location: [facility] INVASIVE CV LAB;  Service: Cardiovascular;  Laterality: N/A;   TONSILLECTOMY     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   Procedure: SHUNT INSERTION VENTRICULAR-PERITONEAL;  Surgeon: Joshua Alm GORMAN, MD;  Location: [facility] OR;  Service: Neurosurgery;  Laterality: N/A;  SHUNT INSERTION VENTRICULAR-PERITONEAL    Social History  reports that he quit smoking about 63 years ago. His smoking use included cigarettes. He smoked an average of 1 pack per day. He has never used smokeless tobacco. He reports that he does not currently use alcohol after a past usage of about 1.0 standard drink of alcohol per week. He reports that he does not use drugs.  Allergies[1]  Family History  Problem Relation Age of Onset   Heart attack Father 63   Cancer Father        prostate   Diabetes Father    Cancer Paternal Uncle        prostate   Dementia Mother 1   Lung cancer Brother   Reviewed on admission  Prior to Admission medications  Medication Sig Start Date End Date Taking? Authorizing Provider  aspirin  EC 81 MG tablet Take 81 mg by mouth daily.     [provider]  carvedilol  (COREG ) 12.5 MG tablet Take 1 tablet (12.5 mg total) by mouth 2 (two) times daily. 01/25/22   Gonfa, Taye T, MD  cetirizine (ZYRTEC) 10 MG tablet Take 10 mg by mouth daily.    [provider]  cholecalciferol  (VITAMIN D3) 25 MCG (1000 UNIT) tablet Take 2,000 Units by mouth daily.    [provider]  cyanocobalamin  (VITAMIN B12) 1000 MCG tablet Take 1 tablet (1,000 mcg total) by mouth daily. 05/03/23   Elgergawy, Brayton GORMAN, MD  ezetimibe  (ZETIA ) 10 MG tablet Take 10 mg by mouth daily. 05/02/21   [provider]  Fish Oil-Coenzyme Q10 (FISH OIL PLUS CO Q-10 PO) Take 1 capsule by mouth daily.    [provider]  furosemide  (LASIX ) 20 MG tablet Take 1 tablet (20 mg total) by mouth daily. Please take extra tablet in the afternoon if developing lower extremity edema or> 3 pounds fluid retention/weight gain overnight 05/03/23 05/07/24  Elgergawy, Brayton GORMAN, MD  HUMALOG KWIKPEN 100 UNIT/ML KwikPen Inject 5 Units into the skin daily. 07/17/23   [provider]  ipratropium-albuterol  (DUONEB) 0.5-2.5 (3) MG/3ML SOLN Take 3 mLs by nebulization 2 (two) times daily. 05/03/23   Elgergawy, Brayton GORMAN, MD  LANTUS  SOLOSTAR 100 UNIT/ML Solostar Pen Inject 20 Units into the skin daily.    [provider]  metFORMIN (GLUCOPHAGE-XR) 500 MG 24 hr tablet Take 500 mg by mouth daily. 01/11/22   [provider]  Multiple Vitamins-Minerals (PRESERVISION AREDS) CAPS Take 2 capsules by mouth daily.  [provider]  pantoprazole  (PROTONIX ) 20 MG tablet Take 20 mg by mouth daily before breakfast.  02/19/19   [provider]    Physical Exam: Vitals:   05/13/24 1214 05/13/24 1215 05/13/24 1330 05/13/24 1430  BP:  128/73 (!) 141/71 136/70  Pulse:  76 72 77  Resp:  (!) 22 (!) 32 18  Temp:      TempSrc:      SpO2:  92% 92% 92%  Weight: 91 kg     Height: 5' 10 (1.778 m)       Physical Exam Constitutional:      General: He is not in acute distress.    Comments: Ill and tired appearing  HENT:     Head: Normocephalic and atraumatic.     Mouth/Throat:     Mouth: Mucous membranes are moist.     Pharynx: Oropharynx is clear.  Eyes:     Extraocular Movements: Extraocular movements intact.     Pupils: Pupils are equal, round, and reactive to light.  Cardiovascular:     Rate and Rhythm: Normal rate and regular rhythm.     Pulses: Normal pulses.     Heart sounds: Normal heart sounds.  Pulmonary:     Effort: Pulmonary effort is normal. No respiratory distress.     Breath sounds: Rales (trace, basilar) present.  Abdominal:     General: Bowel sounds are normal. There  is no distension.     Palpations: Abdomen is soft.     Tenderness: There is no abdominal tenderness.  Musculoskeletal:        General: No swelling or deformity.     Right lower leg: No edema.     Left lower leg: No edema.  Skin:    General: Skin is warm and dry.  Neurological:     General: No focal deficit present.     Mental Status: He is oriented to person, place, and time. Mental status is at baseline.    Labs on Admission: I have personally reviewed following labs and imaging studies  CBC: Recent Labs  Lab 05/13/24 1254  WBC 8.4  NEUTROABS 6.6  HGB 12.1*  HCT 41.4  MCV 107.0*  PLT 148*    Basic Metabolic Panel: Recent Labs  Lab 05/13/24 1254  NA 145  K 4.9  CL 102  CO2 36*  GLUCOSE 150*  BUN 56*  CREATININE 2.46*  CALCIUM  8.9    GFR: Estimated Creatinine Clearance: 25.4 mL/min (A) (by C-G formula based on SCr of 2.46 mg/dL (H)).  Liver Function Tests: Recent Labs  Lab 05/13/24 1254  AST 31  ALT 21  ALKPHOS 76  BILITOT 0.4  PROT 6.3*  ALBUMIN  3.5    Urine analysis:    Component Value Date/Time   COLORURINE YELLOW 07/11/2023 0211   APPEARANCEUR CLEAR 07/11/2023 0211   LABSPEC 1.026 07/11/2023 0211   PHURINE 6.0 07/11/2023 0211   GLUCOSEU 150 (A) 07/11/2023 0211   HGBUR NEGATIVE 07/11/2023 0211   BILIRUBINUR NEGATIVE 07/11/2023 0211   KETONESUR NEGATIVE 07/11/2023 0211   PROTEINUR 30 (A) 07/11/2023 0211   UROBILINOGEN 1.0 08/25/2008 0326   NITRITE NEGATIVE 07/11/2023 0211   LEUKOCYTESUR NEGATIVE 07/11/2023 0211    Radiological Exams on Admission: CT CHEST WO CONTRAST Result Date: 05/13/2024 EXAM: CT CHEST WITHOUT CONTRAST 05/13/2024 12:41:59 PM TECHNIQUE: CT of the chest was performed without the administration of intravenous contrast. Multiplanar reformatted images are provided for review. Automated exposure control, iterative reconstruction, and/or weight based adjustment of  the mA/kV was utilized to reduce the radiation dose to as low  as reasonably achievable. COMPARISON: 04/27/2023 CLINICAL HISTORY: FINDINGS: MEDIASTINUM: Mild cardiomegaly. Dense multivessel coronary atherosclerosis. The ascending aorta remains dilated measuring 4.2 cm. Scattered atherosclerosis throughout the aortic arch and descending aorta. The central airways are clear. LYMPH NODES: No mediastinal, hilar or axillary lymphadenopathy. LUNGS AND PLEURA: Ventriculopleural catheter along the right chest terminating in the posteromedial right costophrenic sulcus. No discontinuity or kinking. There is a small right pleural effusion with compressive atelectasis in the right middle and right lower lobes. Subsegmental atelectasis also present in the left lower lobe. No pneumothorax. Chronic elevation of the right hemidiaphragm. SOFT TISSUES/BONES: Thoracic dish. No acute abnormality of the soft tissues. UPPER ABDOMEN: Multiple small radiopaque gallstones. Limited images of the upper abdomen demonstrates no acute abnormality. IMPRESSION: 1. Small right pleural effusion with compressive atelectasis in the right middle and right lower lobes. No pneumothorax. 2. Similar appearance of the ventriculopleural catheter along the right chest, terminating in the posteromedial right costophrenic sulcus. 3. 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 4. Cholecystolithiasis. Electronically signed by: Rogelia Myers MD 05/13/2024 02:29 PM EST RP Workstation: [id number]   DG Chest Port 1 View Result Date: 05/13/2024 EXAM: 1 VIEW(S) XRAY OF THE CHEST 05/13/2024 12:12:00 PM COMPARISON: 07/11/2023 CLINICAL HISTORY: SOB FINDINGS: LINES, TUBES AND DEVICES: VP shunt tubing traverses the right chest. LUNGS AND PLEURA: Low lung volumes accentuate the cardiomediastinal silhouette and pulmonary vascularity. Elevated right hemidiaphragm, chronic and stable from radiographs and CT last year. Bibasilar chronic atelectasis. Small chronic right pleural effusion suspected. No  pneumothorax. HEART AND MEDIASTINUM: Cardiomegaly. Aortic atherosclerosis. BONES AND SOFT TISSUES: No acute osseous abnormality. Paucity of bowel gas in the visible abdomen. IMPRESSION: 1. Chronic low lung volumes with elevation of the right hemidiaphragm and small right pleural effusion. 2. No new cardiopulmonary abnormality. Electronically signed by: Helayne Hurst MD 05/13/2024 01:43 PM EST RP Workstation: [id number]   CT Cervical Spine Wo Contrast Result Date: 05/13/2024 EXAM: CT CERVICAL SPINE WITHOUT CONTRAST 05/13/2024 12:41:59 PM TECHNIQUE: CT of the cervical spine was performed without the administration of intravenous contrast. Multiplanar reformatted images are provided for review. Automated exposure control, iterative reconstruction, and/or weight based adjustment of the mA/kV was utilized to reduce the radiation dose to as low as reasonably achievable. COMPARISON: CT head today reported separately. CT cervical spine 07/11/2023. CLINICAL HISTORY: 83 year old male. Status post fall yesterday. Polytrauma, blunt. Altered mental status. FINDINGS: BONES AND ALIGNMENT: Stable cervical lordosis. No acute fracture or traumatic malalignment. DEGENERATIVE CHANGES: Chronic cervical spine degeneration, pronounced at the anterior C1-C2 articulation appears stable. SOFT TISSUES: Negative prevertebral soft tissues except for a partially retropharyngeal course of the right carotid (normal variant). Chronic cervical carotid calcified atherosclerosis. Visible right posterior neck shunt tubing extending to the right chest with no adverse features. Trace layering right pleural effusion at the visible lung apex. SPINAL CANAL: Abnormal increased density within the cervical spinal canal below the foramen magnum (series 8 image 37), but favored to be epidural venous engorgement and appears similar to prior cervical spine CT.In the visible upper thoracic spine, there is evidence of chronic asymmetric epidural lipomatosis  (series 8, image 102), which also appears stable. IMPRESSION: 1. No acute traumatic injury identified in the cervical spine. 2. CT evidence of upper cervical epidural venous engorgement , further corroborating the possibility of over shunting. 3. Chronic cervical spine degeneration. 4. Small layering pleural effusion in the right lung apex. Electronically signed by: Helayne Hurst  MD 05/13/2024 01:15 PM EST RP Workstation: [id number]   CT Head Wo Contrast Result Date: 05/13/2024 EXAM: CT HEAD WITHOUT CONTRAST 05/13/2024 12:41:59 PM TECHNIQUE: CT of the head was performed without the administration of intravenous contrast. Automated exposure control, iterative reconstruction, and/or weight based adjustment of the mA/kV was utilized to reduce the radiation dose to as low as reasonably achievable. COMPARISON: Brain MRI 02/01/2022, Head CT 07/11/2023. CLINICAL HISTORY: 83 year old male status post fall yesterday. Polytrauma, blunt. FINDINGS: BRAIN AND VENTRICLES: Chronic right posterior approach ventriculostomy catheter appears stable in configuration along with right posterior convexity shunt reservoir and visible tubing. Chronic CSF shunt with ventricle size smaller since 07/11/2023. Bilateral low density extra axial fluid collections are slightly larger, measuring 5 to 7 mm thickness on the left and up to 8 mm on the right (vs 3 to 4 mm on the left previously and generally 7 mm on the right previously). No midline shift. Mild but increased mass effect on both hemispheres (coronal image 36). No acute intracranial hemorrhage. Stable gray white differentiation. No evidence of acute infarct. No hydrocephalus. No suspicious intracranial vascular hyperdensity. Consider over shunting. ORBITS: No acute abnormality. SINUSES: Paranasal sinuses, tympanic cavities and mastoids are well aerated. SOFT TISSUES AND SKULL: No acute soft tissue abnormality. No acute orbital scalp or soft tissue finding. No skull fracture. IMPRESSION:  1. No acute traumatic injury identified. 2. Chronic CSF shunt with smaller ventricles and larger Bilateral extra-axial low-density fluid collections since February. Consider over-shunting. Electronically signed by: Helayne Hurst MD 05/13/2024 01:11 PM EST RP Workstation: [id number]   EKG: Independently reviewed.  Sinus rhythm at 75 beats minute.  Nonspecific T wave changes.  Low voltage multiple leads.  Right bundle branch block with QRS 05/22/1938.  Assessment/Plan Active Problems:   HLD (hyperlipidemia)   HTN (hypertension)   Presence of ventriculopleural shunt   DMII (diabetes mellitus, type 2) (HCC)   Chronic respiratory failure with hypoxia, on home O2 therapy (HCC) - 2 L/min prn per patient   NPH (normal pressure hydrocephalus) (HCC)   Malignant neoplasm of prostate s/p radiation 2015   AAA (abdominal aortic aneurysm)   OSA on CPAP   Acute on chronic respiratory failure with hypoxia (HCC)   Chronic diastolic CHF (congestive heart failure) (HCC)   AKI (acute kidney injury)   AKI > Creatinine elevated to 2.46 from baseline of 1.3. > Currently appears dry on exam. > Weight appears to be near recent baseline at 91 kg and recent weights this year at cardiology have been in the 89-91 range. > Possibly overdiuresed, started on IV fluids in the ED.  proBNP not significantly elevated. - Continue with gentle IV fluids - Trend renal function and electrolytes  OSA Acute on chronic respiratory failure with hypoxia > Unclear etiology.  As below no evidence of CHF exacerbation. > Chest x-ray and CT chest showed no evidence of pulmonary edema.  Shows stable pleural effusion from chronic right ventriculopleural shunt catheter. > No evidence of CHF on exam, imaging, labs.  BNP only mildly elevated at 400 which is well below threshold for suspicion of CHF for his age. > Does have history of pulmonary hypertension as well, but this appears to been acute change in oxygen  requirement. > Requiring 4 L  which is increased from chronic 2 L in the ED. > Has had chronic issues with nonadherence to his CPAP which may be playing a role. - Monitor on progressive unit for now given oxygen  requirement - Resume CPAP (trial, may not tolerate/may  refuse.) - Supplemental oxygen , wean as tolerated - Follow-up respiratory panel ordered in the ED, if negative for flu COVID RSV may need to consider full RVP.  Chronic diastolic CHF > Last echo showed EF 60 MC 5%, G1 DD, mildly reduced RV function. > As above BMP currently stable/mildly elevated at 465.  No evidence of volume overload on exam nor CT chest. > Concern for possible overdiuresis as above. - Holding Lasix  for now - Continue home Coreg   Encephalopathy > Patient has had decreased level of function in the last week or so.  He is having more issues with his sleep and has been largely in the bed.  At baseline he is able to perform his ADLs and help prepare meals.  Helps take care of his wife who has dementia. > Patient however improved some in the ED and is now alert and oriented x 3 but remains lethargic/tired. > May have had improvement after being persistently on appropriate level of oxygen .  At this point given he is alert and oriented his encephalopathy seems more likely to been secondary to his hypoxia and acute illness/AKI. > Will continue to monitor for improvement, if he fails to further improve may be worth discussing with neurosurgery to evaluate his shunt.  Looks okay on CT head and where it terminates in the pleural cavity.  However, CT C-spine and noted evidence of epidural venous congestion which radiology read said indicates possible over shunting. - Continue to monitor  Normal pressure hydrocephalus  > Stable ventriculopleural shunt on CT head and CT chest.  No evidence of worsening hydrocephalus on CT head. > CT neck/C-spine did note evidence of possible epidural venous engorgement which could indicate, per radiology read, possible  over shunting. - Dicussed with Neurosurgeon on call, who states changes look stable and are not uncommon in patient with shunts in for a long time. - Noted  Hypertension - Holding diuretic - Continue Coreg   Hyperlipidemia - Continue home Zetia   Diabetes > 20 units long-acting insulin  daily at home - 10 units long-acting insulin  daily - SSI  History of prostate cancer History of AAA - Noted  DVT prophylaxis: Lovenox  Code Status:   Full Family Communication:  Updated at bedside Disposition Plan:   Patient is from:  Home  Anticipated DC to:  Pending clinical course  Anticipated DC date:  1 to 3 days  Anticipated DC barriers: None  Consults called:  None Admission status:  Observation, progressive  Severity of Illness: The appropriate patient status for this patient is OBSERVATION. Observation status is judged to be reasonable and necessary in order to provide the required intensity of service to ensure the patient's safety. The patient's presenting symptoms, physical exam findings, and initial radiographic and laboratory data in the context of their medical condition is felt to place them at decreased risk for further clinical deterioration. Furthermore, it is anticipated that the patient will be medically stable for discharge from the hospital within 2 midnights of admission.    Marsa KATHEE Scurry MD [facility]  How to contact the [facility] Attending or Consulting provider 7A - 7P or covering provider during after hours 7P -7A, for this patient?   Check the care team in [facility] and look for a) attending/consulting [facility] provider listed and b) the [facility] team listed Log into [email or web address] and use [facility]'s universal password to access. If you do not have the password, please contact the hospital operator. Locate the [facility] provider you are looking for under [facility]  and page to a number that you can be directly reached. If you still have difficulty reaching the provider,  please page the [facility] (Director on Call) for the Hospitalists listed on amion for assistance.  05/13/2024, 3:28 PM       [1]  Allergies Allergen Reactions   Cefepime  Other (See Comments)    toxic encephalopathy   Lipitor [Atorvastatin] Other (See Comments)    Hip pain   "

## 2024-05-13 NOTE — ED Notes (Addendum)
 Daughter at bedside and wants to talk to provider. Secure chat sent per request

## 2024-05-13 NOTE — Progress Notes (Signed)
 83 y/o M w/ several comorbidities including NPH s/p Vpleural shunt (Certas at 5) who presents with concern for CHF exacerbation. CT head shows b/l hygromas and venous engorgement near the craniocervical junction which is stable since February.   No concern for shunt as the cause of his current symptoms. Nothing to do for the shunt at this time. He can follow up with his previous neurosurgeon Follow up with me prn

## 2024-05-14 ENCOUNTER — Encounter (HOSPITAL_COMMUNITY): Payer: Self-pay | Admitting: Internal Medicine

## 2024-05-14 ENCOUNTER — Inpatient Hospital Stay (HOSPITAL_COMMUNITY)

## 2024-05-14 DIAGNOSIS — Z7984 Long term (current) use of oral hypoglycemic drugs: Secondary | ICD-10-CM | POA: Diagnosis not present

## 2024-05-14 DIAGNOSIS — I2510 Atherosclerotic heart disease of native coronary artery without angina pectoris: Secondary | ICD-10-CM | POA: Diagnosis present

## 2024-05-14 DIAGNOSIS — N1831 Chronic kidney disease, stage 3a: Secondary | ICD-10-CM | POA: Diagnosis present

## 2024-05-14 DIAGNOSIS — I4891 Unspecified atrial fibrillation: Secondary | ICD-10-CM | POA: Diagnosis present

## 2024-05-14 DIAGNOSIS — J9811 Atelectasis: Secondary | ICD-10-CM | POA: Diagnosis present

## 2024-05-14 DIAGNOSIS — J9622 Acute and chronic respiratory failure with hypercapnia: Secondary | ICD-10-CM | POA: Diagnosis present

## 2024-05-14 DIAGNOSIS — Z1152 Encounter for screening for COVID-19: Secondary | ICD-10-CM | POA: Diagnosis not present

## 2024-05-14 DIAGNOSIS — Z794 Long term (current) use of insulin: Secondary | ICD-10-CM | POA: Diagnosis not present

## 2024-05-14 DIAGNOSIS — I5032 Chronic diastolic (congestive) heart failure: Secondary | ICD-10-CM | POA: Diagnosis present

## 2024-05-14 DIAGNOSIS — I130 Hypertensive heart and chronic kidney disease with heart failure and stage 1 through stage 4 chronic kidney disease, or unspecified chronic kidney disease: Secondary | ICD-10-CM | POA: Diagnosis present

## 2024-05-14 DIAGNOSIS — E785 Hyperlipidemia, unspecified: Secondary | ICD-10-CM | POA: Diagnosis present

## 2024-05-14 DIAGNOSIS — Z7982 Long term (current) use of aspirin: Secondary | ICD-10-CM | POA: Diagnosis not present

## 2024-05-14 DIAGNOSIS — G4733 Obstructive sleep apnea (adult) (pediatric): Secondary | ICD-10-CM | POA: Diagnosis present

## 2024-05-14 DIAGNOSIS — E873 Alkalosis: Secondary | ICD-10-CM | POA: Diagnosis present

## 2024-05-14 DIAGNOSIS — I272 Pulmonary hypertension, unspecified: Secondary | ICD-10-CM | POA: Diagnosis present

## 2024-05-14 DIAGNOSIS — I714 Abdominal aortic aneurysm, without rupture, unspecified: Secondary | ICD-10-CM | POA: Diagnosis present

## 2024-05-14 DIAGNOSIS — S62608A Fracture of unspecified phalanx of other finger, initial encounter for closed fracture: Secondary | ICD-10-CM | POA: Diagnosis not present

## 2024-05-14 DIAGNOSIS — E870 Hyperosmolality and hypernatremia: Secondary | ICD-10-CM | POA: Diagnosis present

## 2024-05-14 DIAGNOSIS — E860 Dehydration: Secondary | ICD-10-CM | POA: Diagnosis present

## 2024-05-14 DIAGNOSIS — N179 Acute kidney failure, unspecified: Secondary | ICD-10-CM | POA: Diagnosis present

## 2024-05-14 DIAGNOSIS — E1122 Type 2 diabetes mellitus with diabetic chronic kidney disease: Secondary | ICD-10-CM | POA: Diagnosis present

## 2024-05-14 DIAGNOSIS — J9621 Acute and chronic respiratory failure with hypoxia: Secondary | ICD-10-CM | POA: Diagnosis present

## 2024-05-14 DIAGNOSIS — W19XXXA Unspecified fall, initial encounter: Secondary | ICD-10-CM | POA: Diagnosis present

## 2024-05-14 DIAGNOSIS — G9341 Metabolic encephalopathy: Secondary | ICD-10-CM | POA: Diagnosis present

## 2024-05-14 DIAGNOSIS — G912 (Idiopathic) normal pressure hydrocephalus: Secondary | ICD-10-CM | POA: Diagnosis present

## 2024-05-14 DIAGNOSIS — J9611 Chronic respiratory failure with hypoxia: Secondary | ICD-10-CM | POA: Diagnosis not present

## 2024-05-14 DIAGNOSIS — N400 Enlarged prostate without lower urinary tract symptoms: Secondary | ICD-10-CM | POA: Diagnosis present

## 2024-05-14 DIAGNOSIS — Z9981 Dependence on supplemental oxygen: Secondary | ICD-10-CM | POA: Diagnosis not present

## 2024-05-14 LAB — RESPIRATORY PANEL BY PCR

## 2024-05-14 LAB — COMPREHENSIVE METABOLIC PANEL WITH GFR
ALT: 16 U/L (ref 0–44)
AST: 20 U/L (ref 15–41)
Albumin: 3.3 g/dL — ABNORMAL LOW (ref 3.5–5.0)
Alkaline Phosphatase: 68 U/L (ref 38–126)
Anion gap: 7 (ref 5–15)
BUN: 49 mg/dL — ABNORMAL HIGH (ref 8–23)
CO2: 37 mmol/L — ABNORMAL HIGH (ref 22–32)
Calcium: 8.8 mg/dL — ABNORMAL LOW (ref 8.9–10.3)
Chloride: 105 mmol/L (ref 98–111)
Creatinine, Ser: 1.99 mg/dL — ABNORMAL HIGH (ref 0.61–1.24)
GFR, Estimated: 32 mL/min — ABNORMAL LOW
Glucose, Bld: 110 mg/dL — ABNORMAL HIGH (ref 70–99)
Potassium: 4.6 mmol/L (ref 3.5–5.1)
Sodium: 149 mmol/L — ABNORMAL HIGH (ref 135–145)
Total Bilirubin: 0.4 mg/dL (ref 0.0–1.2)
Total Protein: 5.8 g/dL — ABNORMAL LOW (ref 6.5–8.1)

## 2024-05-14 LAB — CBC
HCT: 39.3 % (ref 39.0–52.0)
Hemoglobin: 11.2 g/dL — ABNORMAL LOW (ref 13.0–17.0)
MCH: 30.6 pg (ref 26.0–34.0)
MCHC: 28.5 g/dL — ABNORMAL LOW (ref 30.0–36.0)
MCV: 107.4 fL — ABNORMAL HIGH (ref 80.0–100.0)
Platelets: 149 K/uL — ABNORMAL LOW (ref 150–400)
RBC: 3.66 MIL/uL — ABNORMAL LOW (ref 4.22–5.81)
RDW: 14 % (ref 11.5–15.5)
WBC: 6.3 K/uL (ref 4.0–10.5)
nRBC: 0 % (ref 0.0–0.2)

## 2024-05-14 LAB — GLUCOSE, CAPILLARY
Glucose-Capillary: 103 mg/dL — ABNORMAL HIGH (ref 70–99)
Glucose-Capillary: 92 mg/dL (ref 70–99)
Glucose-Capillary: 158 mg/dL — ABNORMAL HIGH (ref 70–99)

## 2024-05-14 LAB — CBG MONITORING, ED: Glucose-Capillary: 101 mg/dL — ABNORMAL HIGH (ref 70–99)

## 2024-05-14 LAB — MRSA NEXT GEN BY PCR, NASAL: MRSA by PCR Next Gen: NOT DETECTED

## 2024-05-14 NOTE — Plan of Care (Signed)

## 2024-05-14 NOTE — ED Notes (Signed)
 Pt desat to 83% while sleeping, RT contacted for cpap/bipap. MD aware.

## 2024-05-14 NOTE — Progress Notes (Signed)
 RT NOTE: RT transported PT on bipap from ED to room 5W17 with no complications. RT on floor notified. Vitals are stable.

## 2024-05-14 NOTE — Progress Notes (Signed)
 " PROGRESS NOTE    Jeremiah [name]  FMW:[id number] DOB: 01-02-1941 DOA: 05/13/2024 PCP: Shayne Anes, MD   Brief Narrative:   Jeremiah [name] is a 83 y.o. 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   History obtained assistance of chart review and family.   Patient reports 1 to 1-1/2 weeks of shortness of breath with concern for CHF flare.  Patient has had issues with nonadherence to his Lasix  in past and CPAP/Bipap long term.   EMS was called and patient noted to require 4 L of oxygen  which is increased from his baseline 2 L.  Brought to the ED for further evaluation.   Patient denies specific complaints at this time.  States that he has had issues sleeping and feeling tired.   ED Course: Vital signs in the ED notable for blood pressure in the 120s-140 systolic, respiratory rate in the 20s.  Requiring 4 to 5 L to maintain saturations.   Lab workup included CMP with bicarb 36, BUN 56, creatinine elevated to 2.46 and baseline 1.3, glucose 150, protein 6.3.  CBC with hemoglobin 12.1 which is down from previous baseline at the beginning of the year of 14-15.  Platelets 148.  proBNP mildly elevated at 465.  Respiratory panel for flu COVID and RSV pending.   Chest x-ray showed chronic low lung volumes with right hemidiaphragm elevation and small right pleural effusion but no acute abnormality.  CT chest showed small right pleural effusion similar ventriculopleural catheter.  CT head showed no acute normality and chronic shunt in place.  CT C-spine showed no acute abnormality and CT evidence of epidural venous engorgement possibly suggesting over shunting.   Assessment & Plan:   Active Problems:   HLD (hyperlipidemia)   HTN (hypertension)   Presence  of ventriculopleural shunt   DMII (diabetes mellitus, type 2) (HCC)   Chronic respiratory failure with hypoxia, on home O2 therapy (HCC) - 2 L/min prn per patient   NPH (normal pressure hydrocephalus) (HCC)   Malignant neoplasm of prostate s/p radiation 2015   AAA (abdominal aortic aneurysm)   OSA on CPAP   Acute on chronic respiratory failure with hypoxia (HCC)   Chronic diastolic CHF (congestive heart failure) (HCC)   AKI (acute kidney injury)   AKI > Creatinine elevated to 2.46 from baseline of 1.3. > Currently appears dry on exam. > Weight appears to be near recent baseline at 91 kg and recent weights this year at cardiology have been in the 89-91 range. > Possibly overdiuresed, started on IV fluids in the ED.  proBNP not significantly elevated. - Completed 1 L fluid, encourage oral intake. - Trend renal function and electrolytes   OSA Acute on chronic respiratory failure with hypoxia and hypercarbia  > Unclear etiology.  As below no evidence of CHF exacerbation. > Chest x-ray and CT chest showed no evidence of pulmonary edema.  Shows stable pleural effusion from chronic right ventriculopleural shunt catheter. > No evidence of CHF on exam, imaging, labs.  BNP only mildly elevated at 400 which is well below threshold for suspicion of CHF for his age. > Does have history of pulmonary hypertension as well, but this appears to been acute change in oxygen  requirement. > Requiring 4 L which is increased from chronic 2 L in the  ED. > Has had chronic issues with nonadherence to his CPAP which may be playing a role. - Monitor on progressive unit for now given oxygen  requirement - Resume CPAP (trial, may not tolerate/may refuse.) - Supplemental oxygen , wean as tolerated - Follow-up respiratory panel ordered in the ED, if negative for flu COVID RSV may need to consider full RVP.   Chronic diastolic CHF > Last echo showed EF 60 MC 5%, G1 DD, mildly reduced RV function. > As above BMP  currently stable/mildly elevated at 465.  No evidence of volume overload on exam nor CT chest. > Concern for possible overdiuresis as above. - Holding Lasix  for now - Continue home Coreg    #Metabolic encephalopathy > Head CT negative, likely from hypercarbia/hypoxia/dehydration/AKI  Fall/right hand swelling: mild swelling of thumb and ring finger, will obtain XR   /Normal pressure hydrocephalus  > Stable ventriculopleural shunt on CT head and CT chest.  Continue outpatient follow-up  Hypertension - Holding diuretic - Continue Coreg    Hyperlipidemia - Continue home Zetia    Diabetes > 20 units long-acting insulin  daily at home - 10 units long-acting insulin  daily - SSI   History of prostate cancer History of AAA - Noted   DVT prophylaxis:      Lovenox  Code Status:              Full Family Communication:       Updated at bedside Disposition Plan:              Patient is from:                        Home             Anticipated DC to:                   Pending clinical course             Anticipated DC date:               1 to 3 days             Anticipated DC barriers:         None    Consults called:        None Admission status:     Inpatient  DVT prophylaxis:  Code Status:   Family Communication:  Disposition Plan: Status is: Inpatient Remains inpatient appropriate because: Continued alteration in mental status, BiPAP  Subjective:  Patient seen and examined at bedside in the ER.  He was on BiPAP but struggling to keep it on. Per wife, had a fall and injured right hand Vital signs stable       objective: Vitals:   05/14/24 1157 05/14/24 1200 05/14/24 1334 05/14/24 1615  BP: 95/67 (!) 146/75  126/60  Pulse: 73 77 69 69  Resp: (!) 21 (!) 27 (!) 29 (!) 30  Temp: 98.2 F (36.8 C)   (!) 97.5 F (36.4 C)  TempSrc: Oral   Oral  SpO2: 95% 92% 92% 93%  Weight:      Height:        Intake/Output Summary (Last 24 hours) at 05/14/2024 1622 Last data filed  at 05/14/2024 1200 Gross per 24 hour  Intake 400 ml  Output 550 ml  Net -150 ml   Filed Weights   05/13/24 1214  Weight: 91 kg    Examination:  General exam: Appears calm and comfortable  Respiratory system: Bilateral decreased  breath sounds at bases Cardiovascular system: S1 & S2 heard, Rate controlled Gastrointestinal system: Abdomen is nondistended, soft and nontender. Normal bowel sounds heard. Extremities: No cyanosis, clubbing, edema  Central nervous system: Alert and oriented. No focal neurological deficits. Moving extremities Skin: No rashes, lesions or ulcers Psychiatry: Judgement and insight appear normal. Mood & affect appropriate.     Data Reviewed: I have personally reviewed following labs and imaging studies  CBC: Recent Labs  Lab 05/13/24 1254 05/13/24 1924 05/13/24 2218 05/14/24 0528  WBC 8.4  --   --  6.3  NEUTROABS 6.6  --   --   --   HGB 12.1* 12.2* 12.9* 11.2*  HCT 41.4 36.0* 38.0* 39.3  MCV 107.0*  --   --  107.4*  PLT 148*  --   --  149*   Basic Metabolic Panel: Recent Labs  Lab 05/13/24 1254 05/13/24 1924 05/13/24 2218 05/14/24 0528  NA 145 146* 146* 149*  K 4.9 4.6 4.7 4.6  CL 102  --   --  105  CO2 36*  --   --  37*  GLUCOSE 150*  --   --  110*  BUN 56*  --   --  49*  CREATININE 2.46*  --   --  1.99*  CALCIUM  8.9  --   --  8.8*   GFR: Estimated Creatinine Clearance: 31.3 mL/min (A) (by C-G formula based on SCr of 1.99 mg/dL (H)). Liver Function Tests: Recent Labs  Lab 05/13/24 1254 05/14/24 0528  AST 31 20  ALT 21 16  ALKPHOS 76 68  BILITOT 0.4 0.4  PROT 6.3* 5.8*  ALBUMIN  3.5 3.3*   No results for input(s): LIPASE, AMYLASE in the last 168 hours. No results for input(s): AMMONIA in the last 168 hours. Coagulation Profile: No results for input(s): INR, PROTIME in the last 168 hours. Cardiac Enzymes: No results for input(s): CKTOTAL, CKMB, CKMBINDEX, TROPONINI in the last 168 hours. BNP (last 3  results) Recent Labs    05/13/24 1254  PROBNP 465.0*   HbA1C: No results for input(s): HGBA1C in the last 72 hours. CBG: Recent Labs  Lab 05/13/24 2211 05/14/24 0739 05/14/24 1155 05/14/24 1617  GLUCAP 122* 101* 103* 92   Lipid Profile: No results for input(s): CHOL, HDL, LDLCALC, TRIG, CHOLHDL, LDLDIRECT in the last 72 hours. Thyroid  Function Tests: No results for input(s): TSH, T4TOTAL, FREET4, T3FREE, THYROIDAB in the last 72 hours. Anemia Panel: No results for input(s): VITAMINB12, FOLATE, FERRITIN, TIBC, IRON, RETICCTPCT in the last 72 hours. Sepsis Labs: No results for input(s): PROCALCITON, LATICACIDVEN in the last 168 hours.  Recent Results (from the past 240 hours)  Resp panel by RT-PCR (RSV, Flu A&B, Covid) Anterior Nasal Swab     Status: None   Collection Time: 05/13/24 12:53 PM   Specimen: Anterior Nasal Swab  Result Value Ref Range Status   SARS Coronavirus 2 by RT PCR NEGATIVE NEGATIVE Final   Influenza A by PCR NEGATIVE NEGATIVE Final   Influenza B by PCR NEGATIVE NEGATIVE Final    Comment: (NOTE) The Xpert Xpress SARS-CoV-2/FLU/RSV plus assay is intended as an aid in the diagnosis of influenza from Nasopharyngeal swab specimens and should not be used as a sole basis for treatment. Nasal washings and aspirates are unacceptable for Xpert Xpress SARS-CoV-2/FLU/RSV testing.  Fact Sheet for Patients: bloggercourse.com  Fact Sheet for Healthcare Providers: seriousbroker.it  This test is not yet approved or cleared by the [place]  FDA and has been authorized  for detection and/or diagnosis of SARS-CoV-2 by FDA under an Emergency Use Authorization (EUA). This EUA will remain in effect (meaning this test can be used) for the duration of the COVID-19 declaration under Section 564(b)(1) of the Act, 21 U.S.C. section 360bbb-3(b)(1), unless the authorization is  terminated or revoked.     Resp Syncytial Virus by PCR NEGATIVE NEGATIVE Final    Comment: (NOTE) Fact Sheet for Patients: bloggercourse.com  Fact Sheet for Healthcare Providers: seriousbroker.it  This test is not yet approved or cleared by the [place]  FDA and has been authorized for detection and/or diagnosis of SARS-CoV-2 by FDA under an Emergency Use Authorization (EUA). This EUA will remain in effect (meaning this test can be used) for the duration of the COVID-19 declaration under Section 564(b)(1) of the Act, 21 U.S.C. section 360bbb-3(b)(1), unless the authorization is terminated or revoked.  Performed at [facility], [address], [place], [place] [address]   Respiratory (~20 pathogens) panel by PCR     Status: None   Collection Time: 05/14/24  8:40 AM   Specimen: Nasopharyngeal Swab; Respiratory  Result Value Ref Range Status   Adenovirus NOT DETECTED NOT DETECTED Final   Coronavirus 229E NOT DETECTED NOT DETECTED Final    Comment: (NOTE) The Coronavirus on the Respiratory Panel, DOES NOT test for the novel  Coronavirus (2019 nCoV)    Coronavirus HKU1 NOT DETECTED NOT DETECTED Final   Coronavirus NL63 NOT DETECTED NOT DETECTED Final   Coronavirus OC43 NOT DETECTED NOT DETECTED Final   Metapneumovirus NOT DETECTED NOT DETECTED Final   Rhinovirus / Enterovirus NOT DETECTED NOT DETECTED Final   Influenza A NOT DETECTED NOT DETECTED Final   Influenza B NOT DETECTED NOT DETECTED Final   Parainfluenza Virus 1 NOT DETECTED NOT DETECTED Final   Parainfluenza Virus 2 NOT DETECTED NOT DETECTED Final   Parainfluenza Virus 3 NOT DETECTED NOT DETECTED Final   Parainfluenza Virus 4 NOT DETECTED NOT DETECTED Final   Respiratory Syncytial Virus NOT DETECTED NOT DETECTED Final   Bordetella pertussis NOT DETECTED NOT DETECTED Final   Bordetella Parapertussis NOT DETECTED NOT DETECTED Final   Chlamydophila pneumoniae  NOT DETECTED NOT DETECTED Final   Mycoplasma pneumoniae NOT DETECTED NOT DETECTED Final    Comment: Performed at [facility], [address] [place], [place] [address]  MRSA Next Gen by PCR, Nasal     Status: None   Collection Time: 05/14/24 10:26 AM   Specimen: Nasal Mucosa; Nasal Swab  Result Value Ref Range Status   MRSA by PCR Next Gen NOT DETECTED NOT DETECTED Final    Comment: (NOTE) The GeneXpert MRSA Assay (FDA approved for NASAL specimens only), is one component of a comprehensive MRSA colonization surveillance program. It is not intended to diagnose MRSA infection nor to guide or monitor treatment for MRSA infections. Test performance is not FDA approved in patients less than 43 years old. Performed at [facility], [address] [place], [place] [address]          Radiology Studies: CT CHEST WO CONTRAST Result Date: 05/13/2024 EXAM: CT CHEST WITHOUT CONTRAST 05/13/2024 12:41:59 PM TECHNIQUE: CT of the chest was performed without the administration of intravenous contrast. Multiplanar reformatted images are provided for review. Automated exposure control, iterative reconstruction, and/or weight based adjustment of the mA/kV was utilized to reduce the radiation dose to as low as reasonably achievable. COMPARISON: 04/27/2023 CLINICAL HISTORY: FINDINGS: MEDIASTINUM: Mild cardiomegaly. Dense multivessel coronary atherosclerosis. The ascending  aorta remains dilated measuring 4.2 cm. Scattered atherosclerosis throughout the aortic arch and descending aorta. The central airways are clear. LYMPH NODES: No mediastinal, hilar or axillary lymphadenopathy. LUNGS AND PLEURA: Ventriculopleural catheter along the right chest terminating in the posteromedial right costophrenic sulcus. No discontinuity or kinking. There is a small right pleural effusion with compressive atelectasis in the right middle and right lower lobes. Subsegmental atelectasis also present in the left lower  lobe. No pneumothorax. Chronic elevation of the right hemidiaphragm. SOFT TISSUES/BONES: Thoracic dish. No acute abnormality of the soft tissues. UPPER ABDOMEN: Multiple small radiopaque gallstones. Limited images of the upper abdomen demonstrates no acute abnormality. IMPRESSION: 1. Small right pleural effusion with compressive atelectasis in the right middle and right lower lobes. No pneumothorax. 2. Similar appearance of the ventriculopleural catheter along the right chest, terminating in the posteromedial right costophrenic sulcus. 3. 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 4. Cholecystolithiasis. Electronically signed by: Rogelia Myers MD 05/13/2024 02:29 PM EST RP Workstation: [id number]   DG Chest Port 1 View Result Date: 05/13/2024 EXAM: 1 VIEW(S) XRAY OF THE CHEST 05/13/2024 12:12:00 PM COMPARISON: 07/11/2023 CLINICAL HISTORY: SOB FINDINGS: LINES, TUBES AND DEVICES: VP shunt tubing traverses the right chest. LUNGS AND PLEURA: Low lung volumes accentuate the cardiomediastinal silhouette and pulmonary vascularity. Elevated right hemidiaphragm, chronic and stable from radiographs and CT last year. Bibasilar chronic atelectasis. Small chronic right pleural effusion suspected. No pneumothorax. HEART AND MEDIASTINUM: Cardiomegaly. Aortic atherosclerosis. BONES AND SOFT TISSUES: No acute osseous abnormality. Paucity of bowel gas in the visible abdomen. IMPRESSION: 1. Chronic low lung volumes with elevation of the right hemidiaphragm and small right pleural effusion. 2. No new cardiopulmonary abnormality. Electronically signed by: Helayne Hurst MD 05/13/2024 01:43 PM EST RP Workstation: [id number]   CT Cervical Spine Wo Contrast Result Date: 05/13/2024 EXAM: CT CERVICAL SPINE WITHOUT CONTRAST 05/13/2024 12:41:59 PM TECHNIQUE: CT of the cervical spine was performed without the administration of intravenous contrast. Multiplanar reformatted images are provided for review. Automated  exposure control, iterative reconstruction, and/or weight based adjustment of the mA/kV was utilized to reduce the radiation dose to as low as reasonably achievable. COMPARISON: CT head today reported separately. CT cervical spine 07/11/2023. CLINICAL HISTORY: 83 year old male. Status post fall yesterday. Polytrauma, blunt. Altered mental status. FINDINGS: BONES AND ALIGNMENT: Stable cervical lordosis. No acute fracture or traumatic malalignment. DEGENERATIVE CHANGES: Chronic cervical spine degeneration, pronounced at the anterior C1-C2 articulation appears stable. SOFT TISSUES: Negative prevertebral soft tissues except for a partially retropharyngeal course of the right carotid (normal variant). Chronic cervical carotid calcified atherosclerosis. Visible right posterior neck shunt tubing extending to the right chest with no adverse features. Trace layering right pleural effusion at the visible lung apex. SPINAL CANAL: Abnormal increased density within the cervical spinal canal below the foramen magnum (series 8 image 37), but favored to be epidural venous engorgement and appears similar to prior cervical spine CT.In the visible upper thoracic spine, there is evidence of chronic asymmetric epidural lipomatosis (series 8, image 102), which also appears stable. IMPRESSION: 1. No acute traumatic injury identified in the cervical spine. 2. CT evidence of upper cervical epidural venous engorgement , further corroborating the possibility of over shunting. 3. Chronic cervical spine degeneration. 4. Small layering pleural effusion in the right lung apex. Electronically signed by: Helayne Hurst MD 05/13/2024 01:15 PM EST RP Workstation: [id number]   CT Head Wo Contrast Result Date: 05/13/2024 EXAM: CT HEAD WITHOUT CONTRAST 05/13/2024 12:41:59 PM TECHNIQUE: CT of the head  was performed without the administration of intravenous contrast. Automated exposure control, iterative reconstruction, and/or weight based adjustment of  the mA/kV was utilized to reduce the radiation dose to as low as reasonably achievable. COMPARISON: Brain MRI 02/01/2022, Head CT 07/11/2023. CLINICAL HISTORY: 83 year old male status post fall yesterday. Polytrauma, blunt. FINDINGS: BRAIN AND VENTRICLES: Chronic right posterior approach ventriculostomy catheter appears stable in configuration along with right posterior convexity shunt reservoir and visible tubing. Chronic CSF shunt with ventricle size smaller since 07/11/2023. Bilateral low density extra axial fluid collections are slightly larger, measuring 5 to 7 mm thickness on the left and up to 8 mm on the right (vs 3 to 4 mm on the left previously and generally 7 mm on the right previously). No midline shift. Mild but increased mass effect on both hemispheres (coronal image 36). No acute intracranial hemorrhage. Stable gray white differentiation. No evidence of acute infarct. No hydrocephalus. No suspicious intracranial vascular hyperdensity. Consider over shunting. ORBITS: No acute abnormality. SINUSES: Paranasal sinuses, tympanic cavities and mastoids are well aerated. SOFT TISSUES AND SKULL: No acute soft tissue abnormality. No acute orbital scalp or soft tissue finding. No skull fracture. IMPRESSION: 1. No acute traumatic injury identified. 2. Chronic CSF shunt with smaller ventricles and larger Bilateral extra-axial low-density fluid collections since February. Consider over-shunting. Electronically signed by: Helayne Hurst MD 05/13/2024 01:11 PM EST RP Workstation: [id number]        Scheduled Meds:  aspirin  EC  81 mg Oral Daily   carvedilol   12.5 mg Oral BID   enoxaparin  (LOVENOX ) injection  30 mg Subcutaneous QHS   ezetimibe   10 mg Oral Daily   insulin  aspart  0-15 Units Subcutaneous TID WC   insulin  glargine  10 Units Subcutaneous Daily   pantoprazole   20 mg Oral QAC breakfast   sodium chloride  flush  3 mL Intravenous Q12H   Continuous Infusions:        Derryl Duval, MD [facility] 05/14/2024, 4:22 PM   "

## 2024-05-15 DIAGNOSIS — S62608A Fracture of unspecified phalanx of other finger, initial encounter for closed fracture: Secondary | ICD-10-CM | POA: Diagnosis not present

## 2024-05-15 DIAGNOSIS — J9621 Acute and chronic respiratory failure with hypoxia: Secondary | ICD-10-CM | POA: Diagnosis not present

## 2024-05-15 LAB — GLUCOSE, CAPILLARY
Glucose-Capillary: 105 mg/dL — ABNORMAL HIGH (ref 70–99)
Glucose-Capillary: 96 mg/dL (ref 70–99)
Glucose-Capillary: 92 mg/dL (ref 70–99)
Glucose-Capillary: 183 mg/dL — ABNORMAL HIGH (ref 70–99)

## 2024-05-15 LAB — BLOOD GAS, VENOUS
Acid-Base Excess: 9 mmol/L — ABNORMAL HIGH (ref 0.0–2.0)
Bicarbonate: 40.3 mmol/L — ABNORMAL HIGH (ref 20.0–28.0)
Drawn by: 64724
O2 Saturation: 97.1 %
Patient temperature: 36.3
pCO2, Ven: 91 mmHg (ref 44–60)
pH, Ven: 7.25 (ref 7.25–7.43)
pO2, Ven: 78 mmHg — ABNORMAL HIGH (ref 32–45)

## 2024-05-15 LAB — BASIC METABOLIC PANEL WITH GFR
Anion gap: 9 (ref 5–15)
BUN: 49 mg/dL — ABNORMAL HIGH (ref 8–23)
CO2: 34 mmol/L — ABNORMAL HIGH (ref 22–32)
Calcium: 8.9 mg/dL (ref 8.9–10.3)
Chloride: 104 mmol/L (ref 98–111)
Creatinine, Ser: 2.02 mg/dL — ABNORMAL HIGH (ref 0.61–1.24)
GFR, Estimated: 32 mL/min — ABNORMAL LOW
Glucose, Bld: 115 mg/dL — ABNORMAL HIGH (ref 70–99)
Potassium: 5.1 mmol/L (ref 3.5–5.1)
Sodium: 147 mmol/L — ABNORMAL HIGH (ref 135–145)

## 2024-05-15 MED ORDER — IPRATROPIUM-ALBUTEROL 0.5-2.5 (3) MG/3ML IN SOLN
3.0000 mL | Freq: Four times a day (QID) | RESPIRATORY_TRACT | Status: AC
  Administered 2024-05-15 (×3): 3 mL via RESPIRATORY_TRACT
  Filled 2024-05-15 (×3): qty 3

## 2024-05-15 MED ORDER — BUDESONIDE 0.5 MG/2ML IN SUSP
0.5000 mg | Freq: Two times a day (BID) | RESPIRATORY_TRACT | Status: DC
  Administered 2024-05-15 – 2024-05-21 (×13): 0.5 mg via RESPIRATORY_TRACT
  Filled 2024-05-15 (×8): qty 2

## 2024-05-15 NOTE — Progress Notes (Signed)
 Orthopedic Tech Progress Note Patient Details:  Jeremiah [name] 1940/09/14 [id number]  Ortho Devices Type of Ortho Device: Finger splint Ortho Device/Splint Interventions: Ordered, Application, Adjustment   Post Interventions Patient Tolerated: Well Instructions Provided: Care of device, Adjustment of device  [name] 05/15/2024, 11:39 AM

## 2024-05-15 NOTE — Plan of Care (Signed)
" °  Problem: Education: Goal: Individualized Educational Video(s) Outcome: Progressing   Problem: Coping: Goal: Ability to adjust to condition or change in health will improve Outcome: Progressing   Problem: Fluid Volume: Goal: Ability to maintain a balanced intake and output will improve Outcome: Progressing   Problem: Health Behavior/Discharge Planning: Goal: Ability to identify and utilize available resources and services will improve Outcome: Progressing Goal: Ability to manage health-related needs will improve Outcome: Progressing   Problem: Education: Goal: Knowledge of General Education information will improve Description: Including pain rating scale, medication(s)/side effects and non-pharmacologic comfort measures Outcome: Progressing   Problem: Clinical Measurements: Goal: Will remain free from infection Outcome: Progressing Goal: Diagnostic test results will improve Outcome: Progressing Goal: Respiratory complications will improve Outcome: Progressing   Problem: Activity: Goal: Risk for activity intolerance will decrease Outcome: Progressing   Problem: Nutrition: Goal: Adequate nutrition will be maintained Outcome: Progressing   "

## 2024-05-15 NOTE — Progress Notes (Addendum)
 " PROGRESS NOTE Jeremiah [name]  FMW:[id number] DOB: [birth year] DOA: 05/13/2024 PCP: Shayne Anes, MD   Brief Narrative:  Jeremiah [name] is a 83 y.o. 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  They presented with worsening dyspnea on exertion and fall. Patient has had issues with nonadherence to his Lasix  in past and CPAP/Bipap long term.    ED Course: Vital signs in the ED notable for blood pressure in the 120s-140 systolic, respiratory rate in the 20s.  Requiring 4 to 5 L to maintain saturations.   Lab workup included CMP with bicarb 36, BUN 56, creatinine elevated to 2.46 and baseline 1.3, glucose 150, protein 6.3.  CBC with hemoglobin 12.1 which is down from previous baseline at the beginning of the year of 14-15.  Platelets 148.  proBNP mildly elevated at 465.  Respiratory panel for flu COVID and RSV negative.   Chest x-ray showed chronic low lung volumes with right hemidiaphragm elevation and small right pleural effusion but no acute abnormality. CT chest showed small right pleural effusion similar ventriculopleural catheter. CT head showed no acute normality and chronic shunt in place. CT C-spine showed no acute abnormality and CT evidence of epidural venous engorgement possibly suggesting over shunting.  Assessment & Plan:   Active Problems:   HLD (hyperlipidemia)   HTN (hypertension)   Presence of ventriculopleural shunt   DMII (diabetes mellitus, type 2) (HCC)   Chronic respiratory failure with hypoxia, on home O2 therapy (HCC) - 2 L/min prn per patient   NPH (normal pressure hydrocephalus) (HCC)   Malignant neoplasm of prostate s/p radiation 2015   AAA (abdominal aortic aneurysm)   OSA on CPAP   Acute on chronic respiratory failure with hypoxia (HCC)   Chronic  diastolic CHF (congestive heart failure) (HCC)   AKI (acute kidney injury)  AKI- baseline Cr of about 1.3. today is 2. Clinically euvolemic. Thought to have been over diuresed prior to admission and given IVF in the ED.  > Currently appears dry on exam. Will plan to monitor for recovery at this time and continue to assess fluid status. Good volume UOP. 1.1L overnight - Trend renal function and electrolytes   OSA Acute on chronic respiratory failure with hypoxia and hypercarbia- 2L chronically, now on 10L HFNC Was on cpap overnight and seems to have tolerated well. Denies SOB this am. He has normal WOB and is alert.  Unclear etiology.  As below no evidence of CHF exacerbation. Chest x-ray and CT chest showed no evidence of pulmonary edema.  Shows stable pleural effusion from chronic right ventriculopleural shunt catheter. > No evidence of CHF on exam, imaging, labs.  BNP only mildly elevated at 400 which is well below threshold for suspicion of CHF for his age. > Does have history of pulmonary hypertension. RVP negative.  Asking RT to trial on [place] again this am. Will follow.  - continue bronchodilators and adding budesonide  nebs   Chronic diastolic CHF > Last echo showed EF 60 MC 5%, G1 DD, mildly reduced RV function. > As above BMP currently stable/mildly elevated at 465.  No evidence of volume overload on exam nor CT chest. > Concern for possible overdiuresis as above. - Holding Lasix  for now - Continue home Coreg    #Metabolic encephalopathy > Head CT negative, likely from hypercarbia/hypoxia/dehydration/AKI  Fall/right hand  swelling: xray showing Oblique fracture of the fourth digit proximal phalanx  - brace, buddy tape and ortho follow up - analgesia PRN  Normal pressure hydrocephalus  > Stable ventriculopleural shunt on CT head and CT chest.  Continue outpatient follow-up  Hypertension - Holding diuretic - Continue Coreg    Hyperlipidemia - Continue home Zetia    Diabetes>  20 units long-acting insulin  daily at home - 10 units long-acting insulin  daily - SSI   History of prostate cancer History of AAA - Noted   DVT prophylaxis:      Lovenox  Code Status:              Full Family Communication:    none at bedside Disposition Plan:              Patient is from:                        Home             Anticipated DC to:                   Pending clinical course             Anticipated DC date:               1 to 3 days             Anticipated DC barriers:         None    Consults called:        None Admission status:     Inpatient  Subjective: Pt reports no breathing difficulty. He states that he is doing well. He is oriented to self and location  objective: Vitals:   05/14/24 2056 05/15/24 0004 05/15/24 0412 05/15/24 0500  BP:  127/74 126/71   Pulse: 81 71    Resp:  20    Temp:  97.8 F (36.6 C) 98 F (36.7 C)   TempSrc:  Oral Oral   SpO2:  100%  91%  Weight:    88.8 kg  Height:        Intake/Output Summary (Last 24 hours) at 05/15/2024 9278 Last data filed at 05/15/2024 0600 Gross per 24 hour  Intake 400 ml  Output 1150 ml  Net -750 ml   Filed Weights   05/13/24 1214 05/15/24 0500  Weight: 91 kg 88.8 kg    Examination:  General exam: Appears calm and comfortable  Respiratory system: Bilateral decreased breath sounds at bases Cardiovascular system: S1 & S2 heard, Rate controlled Gastrointestinal system: Abdomen is nondistended, soft and nontender. Normal bowel sounds heard. Extremities: No cyanosis, clubbing. Mild swelling R 4th digit.  Central nervous system: Alert and oriented. No focal neurological deficits. Moving extremities Skin: No rashes, lesions or ulcers Psychiatry: Judgement and insight appear normal. Mood & affect appropriate.   Data Reviewed: I have personally reviewed following labs and imaging studies  CBC: Recent Labs  Lab 05/13/24 1254 05/13/24 1924 05/13/24 2218 05/14/24 0528  WBC 8.4  --   --  6.3   NEUTROABS 6.6  --   --   --   HGB 12.1* 12.2* 12.9* 11.2*  HCT 41.4 36.0* 38.0* 39.3  MCV 107.0*  --   --  107.4*  PLT 148*  --   --  149*   Basic Metabolic Panel: Recent Labs  Lab 05/13/24 1254 05/13/24 1924 05/13/24 2218 05/14/24 0528 05/15/24 0401  NA 145 146* 146* 149* 147*  K 4.9 4.6 4.7 4.6 5.1  CL 102  --   --  105 104  CO2 36*  --   --  37* 34*  GLUCOSE 150*  --   --  110* 115*  BUN 56*  --   --  49* 49*  CREATININE 2.46*  --   --  1.99* 2.02*  CALCIUM  8.9  --   --  8.8* 8.9    Marien LITTIE Piety, MD [facility] 05/15/2024, 7:21 AM   "

## 2024-05-16 DIAGNOSIS — J9621 Acute and chronic respiratory failure with hypoxia: Secondary | ICD-10-CM | POA: Diagnosis not present

## 2024-05-16 LAB — CBC
HCT: 40.5 % (ref 39.0–52.0)
Hemoglobin: 11.9 g/dL — ABNORMAL LOW (ref 13.0–17.0)
MCH: 30.7 pg (ref 26.0–34.0)
MCHC: 29.4 g/dL — ABNORMAL LOW (ref 30.0–36.0)
MCV: 104.4 fL — ABNORMAL HIGH (ref 80.0–100.0)
Platelets: 170 K/uL (ref 150–400)
RBC: 3.88 MIL/uL — ABNORMAL LOW (ref 4.22–5.81)
RDW: 13.7 % (ref 11.5–15.5)
WBC: 6.7 K/uL (ref 4.0–10.5)
nRBC: 0 % (ref 0.0–0.2)

## 2024-05-16 LAB — BASIC METABOLIC PANEL WITH GFR
Anion gap: 7 (ref 5–15)
Anion gap: 5 (ref 5–15)
BUN: 39 mg/dL — ABNORMAL HIGH (ref 8–23)
BUN: 35 mg/dL — ABNORMAL HIGH (ref 8–23)
CO2: 38 mmol/L — ABNORMAL HIGH (ref 22–32)
CO2: 39 mmol/L — ABNORMAL HIGH (ref 22–32)
Calcium: 9 mg/dL (ref 8.9–10.3)
Calcium: 9.1 mg/dL (ref 8.9–10.3)
Chloride: 105 mmol/L (ref 98–111)
Chloride: 103 mmol/L (ref 98–111)
Creatinine, Ser: 1.62 mg/dL — ABNORMAL HIGH (ref 0.61–1.24)
Creatinine, Ser: 1.53 mg/dL — ABNORMAL HIGH (ref 0.61–1.24)
GFR, Estimated: 42 mL/min — ABNORMAL LOW
GFR, Estimated: 45 mL/min — ABNORMAL LOW
Glucose, Bld: 113 mg/dL — ABNORMAL HIGH (ref 70–99)
Glucose, Bld: 230 mg/dL — ABNORMAL HIGH (ref 70–99)
Potassium: 4.4 mmol/L (ref 3.5–5.1)
Potassium: 4.1 mmol/L (ref 3.5–5.1)
Sodium: 150 mmol/L — ABNORMAL HIGH (ref 135–145)
Sodium: 147 mmol/L — ABNORMAL HIGH (ref 135–145)

## 2024-05-16 LAB — OSMOLALITY, URINE: Osmolality, Ur: 549 mosm/kg (ref 300–900)

## 2024-05-16 LAB — OSMOLALITY: Osmolality: 325 mosm/kg (ref 275–295)

## 2024-05-16 LAB — SODIUM, URINE, RANDOM: Sodium, Ur: 106 mmol/L

## 2024-05-16 LAB — CORTISOL: Cortisol, Plasma: 15.7 ug/dL

## 2024-05-16 LAB — GLUCOSE, CAPILLARY
Glucose-Capillary: 109 mg/dL — ABNORMAL HIGH (ref 70–99)
Glucose-Capillary: 136 mg/dL — ABNORMAL HIGH (ref 70–99)
Glucose-Capillary: 213 mg/dL — ABNORMAL HIGH (ref 70–99)
Glucose-Capillary: 106 mg/dL — ABNORMAL HIGH (ref 70–99)

## 2024-05-16 LAB — TSH: TSH: 1.04 u[IU]/mL (ref 0.350–4.500)

## 2024-05-16 MED ORDER — IPRATROPIUM-ALBUTEROL 0.5-2.5 (3) MG/3ML IN SOLN
3.0000 mL | Freq: Four times a day (QID) | RESPIRATORY_TRACT | Status: DC
  Administered 2024-05-16 – 2024-05-18 (×7): 3 mL via RESPIRATORY_TRACT
  Filled 2024-05-16 (×9): qty 3

## 2024-05-16 MED ORDER — DEXTROSE IN LACTATED RINGERS 5 % IV SOLN: INTRAVENOUS | Status: DC

## 2024-05-16 MED ORDER — DEXTROSE IN LACTATED RINGERS 5 % IV SOLN: INTRAVENOUS | Status: AC

## 2024-05-16 NOTE — Evaluation (Signed)
 Occupational Therapy Evaluation Patient Details Name: Jeremiah [name] MRN: [id number] DOB: Dec 11, 1940 Today's Date: 05/16/2024   History of Present Illness   83 y.o. 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   They presented with worsening dyspnea on exertion and fall.     Clinical Impressions Pt admitted with the above concerns. Pt currently with functional limitations due to the deficits listed below (see OT Problem List). Prior to admit, pt was living at home with his wife. Caregiver assistance available 5 days a week for 3 hr/day. Pt reports that he did not use any AD for functional mobility. Able to complete all BADL tasks at Mod I level. Pt will benefit from acute skilled OT to increase their safety and independence with ADL and functional mobility for ADL to facilitate discharge. Patient will benefit from continued inpatient follow up therapy, <3 hours/day at discharge unless family is able to set up 24/7 assistance at home.        If plan is discharge home, recommend the following:   A little help with walking and/or transfers;A little help with bathing/dressing/bathroom;Help with stairs or ramp for entrance     Functional Status Assessment   Patient has had a recent decline in their functional status and demonstrates the ability to make significant improvements in function in a reasonable and predictable amount of time.     Equipment Recommendations   None recommended by OT      Precautions/Restrictions   Precautions Precautions: Fall Recall of Precautions/Restrictions: Impaired Precaution/Restrictions Comments: right hand 4th digit proximal phalanx fx Restrictions Weight Bearing Restrictions Per Provider Order: Yes RUE Weight Bearing Per Provider  Order: Weight bear through elbow only Other Position/Activity Restrictions: no order for weight bearing status but try to eliminate weightbearing into hand d/t fracture.     Mobility Bed Mobility Overal bed mobility: Needs Assistance Bed Mobility: Supine to Sit     Supine to sit: Min assist, HOB elevated, Used rails     General bed mobility comments: increased time needed to transition with assist to bring trunk up off HOB    Transfers Overall transfer level: Needs assistance Equipment used: Rolling walker (2 wheels) Transfers: Bed to chair/wheelchair/BSC, Sit to/from Stand Sit to Stand: Contact guard assist     Step pivot transfers: Contact guard assist     General transfer comment: Required 3 attempts to power up before achieve full standing posture. Slight buckle of knees while standing.      Balance Overall balance assessment: History of Falls, Needs assistance Sitting-balance support: No upper extremity supported, Feet supported Sitting balance-Leahy Scale: Fair     Standing balance support: Bilateral upper extremity supported, During functional activity, Reliant on assistive device for balance Standing balance-Leahy Scale: Poor     ADL either performed or assessed with clinical judgement   ADL       Grooming: Set up;Sitting   Upper Body Bathing: Set up;Sitting   Lower Body Bathing: Minimal assistance;Sitting/lateral leans   Upper Body Dressing : Set up;Sitting   Lower Body Dressing: Total assistance;Bed level   Toilet Transfer: Contact guard assist;Stand-pivot;BSC/3in1;Rolling walker (2 wheels)   Toileting- Clothing Manipulation and Hygiene: Minimal assistance;Sit to/from stand;Sitting/lateral lean               Vision Baseline Vision/History: 0 No visual deficits Ability to See in  Adequate Light: 0 Adequate Patient Visual Report: No change from baseline Vision Assessment?: No apparent visual deficits     Perception Perception: Not tested        Praxis Praxis: Not tested       Pertinent Vitals/Pain Pain Assessment Pain Assessment: No/denies pain     Extremity/Trunk Assessment Upper Extremity Assessment Upper Extremity Assessment: Right hand dominant;Overall [facility] for tasks assessed   Lower Extremity Assessment Lower Extremity Assessment: Defer to PT evaluation   Cervical / Trunk Assessment Cervical / Trunk Assessment: Normal   Communication Communication Communication: No apparent difficulties   Cognition Arousal: Alert Behavior During Therapy: WFL for tasks assessed/performed, Impulsive Cognition: No family/caregiver present to determine baseline          Following commands: Intact       Cueing  General Comments   Cueing Techniques: Verbal cues  Difficulty getting an accurate HR during session. At rest, HR was tachy. SpO2 in 90's on 5L HFNC.           Home Living Family/patient expects to be discharged to:: Private residence Living Arrangements: Spouse/significant other (wife has dementia) Available Help at Discharge: Personal care attendant;Available PRN/intermittently (Has caregiver assist 5 days/week for 3 hours/day.) Type of Home: House Home Access: Stairs to enter [employer] of Steps: 2 from garage Entrance Stairs-Rails: Right;Left;Can reach both Home Layout: Multi-level;Able to live on main level with bedroom/bathroom Alternate Level Stairs-Number of Steps: stays on main level   Bathroom Shower/Tub: Producer, Television/film/video: Standard     Home Equipment: [employer] (2 wheels);Rollator (4 wheels);Cane - single point;Grab bars - tub/shower;Grab bars - toilet;Shower seat;Other (comment) (chronic home O2 use (2-3L))          Prior Functioning/Environment Prior Level of Function : Independent/Modified Independent;History of Falls (last six months)       OT Problem List: Decreased strength;Decreased activity tolerance;Decreased safety awareness;Impaired balance  (sitting and/or standing)   OT Treatment/Interventions: Self-care/ADL training;Therapeutic exercise;Therapeutic activities;Energy conservation;DME and/or AE instruction;Patient/family education;Balance training      OT Goals(Current goals can be found in the care plan section)   Acute Rehab OT Goals Patient Stated Goal: to get in recliner OT Goal Formulation: With patient Time For Goal Achievement: 05/30/24 Potential to Achieve Goals: Good   OT Frequency:  Min 2X/week       AM-PAC OT 6 Clicks Daily Activity     Outcome Measure Help from another person eating meals?: None Help from another person taking care of personal grooming?: A Little Help from another person toileting, which includes using toliet, bedpan, or urinal?: Total Help from another person bathing (including washing, rinsing, drying)?: A Lot Help from another person to put on and taking off regular upper body clothing?: A Little Help from another person to put on and taking off regular lower body clothing?: Total 6 Click Score: 14   End of Session Equipment Utilized During Treatment: Rolling walker (2 wheels);Oxygen  Nurse Communication: Mobility status  Activity Tolerance: Patient tolerated treatment well Patient left: in chair;with call bell/phone within reach;with chair alarm set  OT Visit Diagnosis: Muscle weakness (generalized) (M62.81);History of falling (Z91.81)                Time: 1521-1550 OT Time Calculation (min): 29 min Charges:  OT General Charges $OT Visit: 1 Visit OT Evaluation $OT Eval Low Complexity: 1 Low OT Treatments $Self Care/Home Management : 8-22 mins  Leita Howell, OTR/L,CBIS  Supplemental OT - [facility] and [facility] Secure Chat Preferred  Zethan Alfieri, Leita BIRCH 05/16/2024, 4:12 PM

## 2024-05-16 NOTE — Evaluation (Signed)
 Physical Therapy Evaluation Patient Details Name: Jeremiah [name] MRN: [id number] DOB: 08/11/1940 Today's Date: 05/16/2024  History of Present Illness  83 y.o. male presents to [facility] 05/13/24 after fall with worsening dyspnea w/ exertion. Admitted with acute on chronic respiratory failure and metabolic encephalopathy. Imaging showed oblique fx of R 4th digit proximal phalanx. 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 Pt reported being independent for mobility with no AD. Pt reported hx of 4-5 falls in the past few months that pt attributes to moving too quickly. Pt presents close to baseline requiring MinA to stand and CGA to step-pivot to the recliner with use of RW. When marching in place, pt had an episode of B LE's buckling, however, was able to correct with no assist or cueing. Recommending <3hrs post acute rehab to prevent future falls and work towards independence with mobility. If family is able to arrange for 24/7 assist, would then recommend HHPT. Acute PT to follow.       If plan is discharge home, recommend the following: A little help with walking and/or transfers;A little help with bathing/dressing/bathroom;Assist for transportation;Help with stairs or ramp for entrance   Can travel by private vehicle   Yes    Equipment Recommendations None recommended by PT     Functional Status Assessment Patient has had a recent decline in their functional status and demonstrates the ability to make significant improvements in function in a reasonable and predictable amount of time.     Precautions / Restrictions Precautions Precautions: Fall Recall of Precautions/Restrictions: Impaired Precaution/Restrictions Comments: right  hand 4th digit proximal phalanx fx Required Braces or Orthoses: Splint/Cast Splint/Cast: splint on R 4th digit Restrictions Weight Bearing Restrictions Per Provider Order: Yes RUE Weight Bearing Per Provider Order: Weight bear through elbow only Other Position/Activity Restrictions: no order for weight bearing status but try to eliminate weightbearing into hand d/t fracture.      Mobility  Bed Mobility  General bed mobility comments: seated on EOB upon arrival    Transfers Overall transfer level: Needs assistance Equipment used: Rolling walker (2 wheels) Transfers: Bed to chair/wheelchair/BSC, Sit to/from Stand Sit to Stand: Min assist   Step pivot transfers: Contact guard assist    General transfer comment: x3 attempts with no physical assist with use of momentum. MinA to boost-up. One episode of B LE's buckling when marching in place    Ambulation/Gait Ambulation/Gait assistance: Contact guard assist Gait Distance (Feet): 4 Feet Assistive device: Rolling walker (2 wheels) Gait Pattern/deviations: Step-through pattern, Decreased stride length Gait velocity: decr     General Gait Details: step through pattern with reliance on UE support    Balance Overall balance assessment: History of Falls, Needs assistance Sitting-balance support: No upper extremity supported, Feet supported Sitting balance-Leahy Scale: Fair     Standing balance support: Bilateral upper extremity supported, During functional activity, Reliant on assistive device for balance Standing balance-Leahy Scale: Poor       Pertinent Vitals/Pain Pain Assessment Pain Assessment: No/denies pain    Home Living Family/patient expects to be discharged to:: Private residence Living Arrangements: Spouse/significant other (wife has dementia) Available Help at Discharge: Personal care attendant;Available PRN/intermittently (Has caregiver assist 5 days/week for 3 hours/day.) Type of Home: House Home Access:  Stairs to enter Entrance Stairs-Rails: Right;Left;Can reach both Entrance Stairs-Number of Steps: 2 from garage Alternate Level Stairs-Number of Steps: stays on main level Home Layout: Multi-level;Able to live on main level with bedroom/bathroom Home Equipment: Rolling Walker (2 wheels);Rollator (4 wheels);Cane - single point;Grab bars - tub/shower;Grab bars - toilet;Shower seat;Other (comment) (chronic home O2 use (2-3L))      Prior Function Prior Level of Function : Independent/Modified Independent;History of Falls (last six months)     Mobility Comments: Ind with no AD, hx of at least 4-5 falls in the past 6 months. Attributes to moving too quickly       Extremity/Trunk Assessment   Upper Extremity Assessment Upper Extremity Assessment: Defer to OT evaluation    Lower Extremity Assessment Lower Extremity Assessment: Generalized weakness    05/16/24 1614  General Exercises - Lower Extremity  Hip Flexion/Marching AROM;Both;10 reps;Standing      Cervical / Trunk Assessment Cervical / Trunk Assessment: Normal  Communication   Communication Communication: No apparent difficulties    Cognition Arousal: Alert Behavior During Therapy: WFL for tasks assessed/performed, Impulsive   PT - Cognitive impairments: No family/caregiver present to determine baseline, Problem solving, Safety/Judgement  Following commands: Intact       Cueing Cueing Techniques: Verbal cues     General Comments General comments (skin integrity, edema, etc.): Difficulty getting an accurate HR during session. At rest, HR was tachy. SpO2 in 90's on 5L HFNC.     PT Assessment Patient needs continued PT services  PT Problem List Decreased strength;Decreased activity tolerance;Decreased balance;Decreased mobility       PT Treatment Interventions DME instruction;Gait training;Functional mobility training;Therapeutic activities;Therapeutic exercise;Balance training;Neuromuscular  re-education;Patient/family education    PT Goals (Current goals can be found in the Care Plan section)  Acute Rehab PT Goals Patient Stated Goal: to go home PT Goal Formulation: With patient Time For Goal Achievement: 05/30/24 Potential to Achieve Goals: Good    Frequency Min 2X/week        AM-PAC PT 6 Clicks Mobility  Outcome Measure Help needed turning from your back to your side while in a flat bed without using bedrails?: A Little Help needed moving from lying on your back to sitting on the side of a flat bed without using bedrails?: A Little Help needed moving to and from a bed to a chair (including a wheelchair)?: A Little Help needed standing up from a chair using your arms (e.g., wheelchair or bedside chair)?: A Little Help needed to walk in hospital room?: A Little Help needed climbing 3-5 steps with a railing? : A Lot 6 Click Score: 17    End of Session Equipment Utilized During Treatment: Oxygen  Activity Tolerance: Patient tolerated treatment well Patient left: in chair;with call bell/phone within reach;with chair alarm set Nurse Communication: Mobility status PT Visit Diagnosis: Unsteadiness on feet (R26.81);Other abnormalities of gait and mobility (R26.89);Muscle weakness (generalized) (M62.81);History of falling (Z91.81)    Time: 8455-8443 PT Time Calculation (min) (ACUTE ONLY): 12 min   Charges:   PT Evaluation $PT Eval Low Complexity: 1 Low   PT General Charges $$ ACUTE PT VISIT: 1 Visit       Kate ORN, PT, DPT Secure Chat Preferred  Rehab Office [phone]   Kate BRAVO Wendolyn 05/16/2024, 4:26 PM

## 2024-05-16 NOTE — Plan of Care (Signed)
" °  Problem: Coping: Goal: Ability to adjust to condition or change in health will improve Outcome: Progressing   Problem: Fluid Volume: Goal: Ability to maintain a balanced intake and output will improve Outcome: Progressing   Problem: Tissue Perfusion: Goal: Adequacy of tissue perfusion will improve Outcome: Progressing   Problem: Clinical Measurements: Goal: Will remain free from infection Outcome: Progressing Goal: Diagnostic test results will improve Outcome: Progressing Goal: Respiratory complications will improve Outcome: Progressing   Problem: Activity: Goal: Risk for activity intolerance will decrease Outcome: Progressing   Problem: Nutrition: Goal: Adequate nutrition will be maintained Outcome: Progressing   "

## 2024-05-16 NOTE — TOC Initial Note (Signed)
 Transition of Care 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   Patient lives at home with wife, who has dementia. Patient reports having a son and daughter for support. Son did come to room during conversation with patient, and reports patient/wife have private caregivers at home 5 days/week for about 3hours/day. Patient's son does express concern about him going back home after hospitalization, reports feeling that his parents are declining and may no longer be safe at home alone, when caregivers are not present.   Patient has PCP, caregiver assists with medications, DME reviewed. Patient has home O2 3L through [employer].   Currently waiting for therapy evals.  CM will continue to follow.      Expected Discharge Plan:  (tbd) Barriers to Discharge: Continued Medical Work up   Patient Goals and CMS Choice            Expected Discharge Plan and Services       Living arrangements for the past 2 months: Single Family Home                                      Prior Living Arrangements/Services Living arrangements for the past 2 months: Single Family Home Lives with:: Self, Spouse Patient language and need for interpreter reviewed:: Yes Do you feel safe going back to the place where you live?: Yes      Need for Family Participation in Patient Care: Yes (Comment) Care giver support system in place?: Yes (comment) Current home services: DME (cane, regular walker, home o2 at 3L through  [employer]) Criminal Activity/Legal Involvement Pertinent to Current Situation/Hospitalization: No - Comment as needed  Activities of Daily Living   ADL Screening (condition at time of admission) Independently performs ADLs?: Yes (appropriate for developmental age) Is the patient deaf or have difficulty hearing?: No Does the patient have difficulty seeing, even when wearing glasses/contacts?: Yes Does the patient have difficulty concentrating, remembering, or making decisions?: No  Permission Sought/Granted                  Emotional Assessment Appearance:: Developmentally appropriate Attitude/Demeanor/Rapport: Engaged Affect (typically observed): Calm Orientation: : Oriented to Self, Oriented to Place, Oriented to  Time, Oriented to Situation, Fluctuating Orientation (Suspected and/or reported Sundowners) Alcohol / Substance Use: Not Applicable Psych Involvement: No (comment)  Admission diagnosis:  AKI (acute kidney injury) [N17.9] Fall, initial encounter [W19.XXXA] Patient Active Problem List   Diagnosis Date Noted   AKI (acute kidney injury) 05/13/2024   Hypernatremia 04/27/2023   Toxic encephalopathy 02/02/2022   Chronic diastolic CHF (congestive heart failure) (HCC) 02/02/2022   HCAP (healthcare-associated pneumonia) 01/27/2022   Non-ST elevation (NSTEMI) myocardial infarction [facility])    Chest pressure 01/21/2022   Chronic respiratory failure with hypoxia, on home O2 therapy (HCC) - 2 L/min prn per patient 01/21/2022   DMII (diabetes mellitus, type 2) (HCC) 08/23/2020  Malnutrition of moderate degree 08/23/2020   NPH (normal pressure hydrocephalus) (HCC) 08/12/2019   Palliative care by specialist    Goals of care, counseling/discussion    DNR (do not resuscitate)    Muscular weakness    Xerostomia    Acute on chronic respiratory failure with hypoxia (HCC) 07/05/2019   Generalized weakness    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   HLD (hyperlipidemia)  06/10/2016   HTN (hypertension) 06/10/2016   AAA (abdominal aortic aneurysm) 06/10/2016   OSA on CPAP 06/10/2016   Malignant neoplasm of prostate s/p radiation 2015 03/10/2014   PCP:  Shayne Anes, MD Pharmacy:   [facility] [id number] ([address]),  - [address] [place] ([place]) [place] [address] Phone: [phone] Fax: [phone]  [facility] [address] [place] [address] Phone: [phone] Fax: [phone]     Social Drivers of Health (SDOH) Social History: SDOH Screenings   Food Insecurity: No Food Insecurity (05/14/2024)  Housing: Low Risk (05/14/2024)  Transportation Needs: No Transportation Needs (05/14/2024)  Utilities: Not At Risk (05/14/2024)  Depression (PHQ2-9): Low Risk (06/21/2022)  Social Connections: Socially Integrated (05/14/2024)  Tobacco Use: Medium Risk (05/14/2024)   SDOH Interventions:     Readmission Risk Interventions     No data to display

## 2024-05-16 NOTE — Progress Notes (Signed)
 " PROGRESS NOTE Jeremiah [name]  FMW:[id number] DOB: September 17, 1940 DOA: 05/13/2024 PCP: Shayne Anes, MD   Brief Narrative:  Jeremiah [name] is a 83 y.o. 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  They presented with worsening dyspnea on exertion and fall. Patient has had issues with nonadherence to his Lasix  in past and CPAP/Bipap long term. Has had several very similar admissions in the past year.   ED Course: Vital signs in the ED notable for blood pressure in the 120s-140 systolic, respiratory rate in the 20s.  Requiring 4 to 5 L to maintain saturations. Lab workup included CMP with bicarb 36, BUN 56, creatinine elevated to 2.46 and baseline 1.3, glucose 150, protein 6.3.  CBC with hemoglobin 12.1 which is down from previous baseline at the beginning of the year of 14-15.  Platelets 148.  proBNP mildly elevated at 465.  Respiratory panel for flu COVID and RSV negative.  Chest x-ray showed chronic low lung volumes with right hemidiaphragm elevation and small right pleural effusion but no acute abnormality. CT chest showed small right pleural effusion similar ventriculopleural catheter. CT head showed no acute normality and chronic shunt in place. CT C-spine showed no acute abnormality and CT evidence of epidural venous engorgement possibly suggesting over shunting.  Assessment & Plan:  Acute on chronic respiratory failure with hypoxia and hypercarbia- 2-3L chronically, now on 10L HFNC. Follows with [facility] pulmonology outpatient. Last seen 2/12. Denies SOB this am. He has normal WOB and is alert.  Unclear etiology.  No wheezing. Chest x-ray and CT chest showed no evidence of pulmonary edema.  Shows stable pleural effusion from chronic right ventriculopleural shunt catheter. Possible  pulmonary hypertension driven. RVP negative.  - wean as tolerated - continue bronchodilators and adding budesonide  nebs - incentive spirometer, flutter valve  AKI- baseline Cr of about 1.3. today is 1.6 s/p IVF. Clinically euvolemic. Thought to have been over diuresed prior to admission.  Will plan to monitor for recovery at this time and continue to assess fluid status. Good volume UOP. 1L overnight - Trend renal function and electrolytes  Hypernatremia- Na+ increased to 150. His diuretics have been held since admission for AKI.  - obtain serum and urine sodium/osmolality  - then start D5LR - recheck Na+ this afternoon  Chronic diastolic CHF- Last echo showed EF 60 MC 5%, G1 DD, mildly reduced RV function. No evidence of volume overload on exam nor CT chest. - Holding Lasix  for now - Continue home Coreg    #Metabolic encephalopathy- Head CT negative, likely from hypercarbia/hypoxia/dehydration/AKI. Appears to be close to baseline again per son description.   Fall/right hand swelling: xray showing Oblique fracture of the fourth digit proximal phalanx  - brace, buddy tape and ortho follow up - analgesia PRN  Normal pressure hydrocephalus  > Stable ventriculopleural shunt on CT head and CT chest.  Continue outpatient follow-up  Hypertension - Holding diuretic - Continue Coreg    Hyperlipidemia - Continue home Zetia    Diabetes> 20 units long-acting insulin  daily at home held currently with low-normal blood sugars. Hgb A1c is 7.7.  - moderate sliding scale   History of prostate cancer History of AAA - Noted   DVT prophylaxis:      Lovenox  Code Status:              Full Family Communication:  son on phone Disposition Plan:              Patient is from:                        Home             Anticipated DC to:                   Pending clinical course             Anticipated DC date:               1 to 3 days             Anticipated DC barriers:         None    Consults  called:        None Admission status:     Inpatient  Subjective: Pt reports no breathing difficulty. He is surprised that he is not better and discharged yet. Denies chest pain, SOB.   objective: Vitals:   05/15/24 2052 05/16/24 0017 05/16/24 0335 05/16/24 0500  BP: (!) 140/70 119/70 (!) 151/96   Pulse: 79 73 71   Resp: 15 19 20    Temp:  97.8 F (36.6 C) 98.2 F (36.8 C)   TempSrc:  Oral Oral   SpO2:  97% 92%   Weight:    86.7 kg  Height:        Intake/Output Summary (Last 24 hours) at 05/16/2024 0711 Last data filed at 05/16/2024 0530 Gross per 24 hour  Intake --  Output 1050 ml  Net -1050 ml   Filed Weights   05/13/24 1214 05/15/24 0500 05/16/24 0500  Weight: 91 kg 88.8 kg 86.7 kg   Examination: General exam: Appears calm and comfortable  Respiratory system: Bilateral decreased breath sounds at bases Cardiovascular system: S1 & S2 heard, Rate controlled Gastrointestinal system: Abdomen is nondistended, soft and nontender. Normal bowel sounds heard. Extremities: No cyanosis, clubbing. Mild swelling R 4th digit.  Central nervous system: Alert and oriented. No focal neurological deficits. Moving extremities Skin: No rashes, lesions or ulcers Psychiatry: Judgement and insight appear normal. Mood & affect appropriate.   Data Reviewed: I have personally reviewed following labs and imaging studies  CBC: Recent Labs  Lab 05/13/24 1254 05/13/24 1924 05/13/24 2218 05/14/24 0528 05/16/24 0416  WBC 8.4  --   --  6.3 6.7  NEUTROABS 6.6  --   --   --   --   HGB 12.1* 12.2* 12.9* 11.2* 11.9*  HCT 41.4 36.0* 38.0* 39.3 40.5  MCV 107.0*  --   --  107.4* 104.4*  PLT 148*  --   --  149* 170   Basic Metabolic Panel: Recent Labs  Lab 05/13/24 1254 05/13/24 1924 05/13/24 2218 05/14/24 0528 05/15/24 0401 05/16/24 0416  NA 145 146* 146* 149* 147* 150*  K 4.9 4.6 4.7 4.6 5.1 4.4  CL 102  --   --  105 104 105  CO2 36*  --   --  37* 34* 38*  GLUCOSE 150*  --   --  110*  115* 113*  BUN 56*  --   --  49* 49* 39*  CREATININE 2.46*  --   --  1.99* 2.02* 1.62*  CALCIUM  8.9  --   --  8.8* 8.9 9.0    Jeremiah LITTIE Piety, MD [facility] 05/16/2024, 7:11 AM   "

## 2024-05-17 ENCOUNTER — Other Ambulatory Visit (HOSPITAL_COMMUNITY): Payer: Self-pay

## 2024-05-17 ENCOUNTER — Inpatient Hospital Stay (HOSPITAL_COMMUNITY)

## 2024-05-17 DIAGNOSIS — N179 Acute kidney failure, unspecified: Secondary | ICD-10-CM | POA: Diagnosis not present

## 2024-05-17 DIAGNOSIS — J9611 Chronic respiratory failure with hypoxia: Secondary | ICD-10-CM | POA: Diagnosis not present

## 2024-05-17 DIAGNOSIS — Z9981 Dependence on supplemental oxygen: Secondary | ICD-10-CM | POA: Diagnosis not present

## 2024-05-17 LAB — GLUCOSE, CAPILLARY
Glucose-Capillary: 139 mg/dL — ABNORMAL HIGH (ref 70–99)
Glucose-Capillary: 114 mg/dL — ABNORMAL HIGH (ref 70–99)
Glucose-Capillary: 187 mg/dL — ABNORMAL HIGH (ref 70–99)
Glucose-Capillary: 170 mg/dL — ABNORMAL HIGH (ref 70–99)

## 2024-05-17 LAB — COMPREHENSIVE METABOLIC PANEL WITH GFR
ALT: 9 U/L (ref 0–44)
AST: 18 U/L (ref 15–41)
Albumin: 3.2 g/dL — ABNORMAL LOW (ref 3.5–5.0)
Alkaline Phosphatase: 68 U/L (ref 38–126)
Anion gap: 7 (ref 5–15)
BUN: 34 mg/dL — ABNORMAL HIGH (ref 8–23)
CO2: 38 mmol/L — ABNORMAL HIGH (ref 22–32)
Calcium: 8.7 mg/dL — ABNORMAL LOW (ref 8.9–10.3)
Chloride: 102 mmol/L (ref 98–111)
Creatinine, Ser: 1.46 mg/dL — ABNORMAL HIGH (ref 0.61–1.24)
GFR, Estimated: 47 mL/min — ABNORMAL LOW
Glucose, Bld: 137 mg/dL — ABNORMAL HIGH (ref 70–99)
Potassium: 4.2 mmol/L (ref 3.5–5.1)
Sodium: 147 mmol/L — ABNORMAL HIGH (ref 135–145)
Total Bilirubin: 0.4 mg/dL (ref 0.0–1.2)
Total Protein: 5.7 g/dL — ABNORMAL LOW (ref 6.5–8.1)

## 2024-05-17 LAB — CBC WITH DIFFERENTIAL/PLATELET
Abs Immature Granulocytes: 0.06 K/uL (ref 0.00–0.07)
Basophils Absolute: 0 K/uL (ref 0.0–0.1)
Basophils Relative: 0 %
Eosinophils Absolute: 0.2 K/uL (ref 0.0–0.5)
Eosinophils Relative: 3 %
HCT: 40 % (ref 39.0–52.0)
Hemoglobin: 11.8 g/dL — ABNORMAL LOW (ref 13.0–17.0)
Immature Granulocytes: 1 %
Lymphocytes Relative: 10 %
Lymphs Abs: 0.7 K/uL (ref 0.7–4.0)
MCH: 30.9 pg (ref 26.0–34.0)
MCHC: 29.5 g/dL — ABNORMAL LOW (ref 30.0–36.0)
MCV: 104.7 fL — ABNORMAL HIGH (ref 80.0–100.0)
Monocytes Absolute: 0.6 K/uL (ref 0.1–1.0)
Monocytes Relative: 9 %
Neutro Abs: 5.1 K/uL (ref 1.7–7.7)
Neutrophils Relative %: 77 %
Platelets: 155 K/uL (ref 150–400)
RBC: 3.82 MIL/uL — ABNORMAL LOW (ref 4.22–5.81)
RDW: 13.5 % (ref 11.5–15.5)
WBC: 6.6 K/uL (ref 4.0–10.5)
nRBC: 0 % (ref 0.0–0.2)

## 2024-05-17 LAB — T4, FREE: Free T4: 0.9 ng/dL (ref 0.80–2.00)

## 2024-05-17 LAB — PROTIME-INR
INR: 1.1 (ref 0.8–1.2)
Prothrombin Time: 14.4 s (ref 11.4–15.2)

## 2024-05-17 LAB — PHOSPHORUS: Phosphorus: 2.5 mg/dL (ref 2.5–4.6)

## 2024-05-17 LAB — AMMONIA: Ammonia: 31 umol/L (ref 9–35)

## 2024-05-17 LAB — MAGNESIUM: Magnesium: 1.9 mg/dL (ref 1.7–2.4)

## 2024-05-17 MED ORDER — DEXTROSE 5 % IV SOLN: INTRAVENOUS | Status: DC

## 2024-05-17 MED ORDER — TAMSULOSIN HCL 0.4 MG PO CAPS
0.4000 mg | ORAL_CAPSULE | Freq: Every day | ORAL | Status: DC
  Administered 2024-05-17 – 2024-05-21 (×5): 0.4 mg via ORAL
  Filled 2024-05-17 (×2): qty 1

## 2024-05-17 MED ORDER — DEXTROSE 5 % IV SOLN: INTRAVENOUS | Status: AC

## 2024-05-17 MED ORDER — ENOXAPARIN SODIUM 40 MG/0.4ML IJ SOSY
40.0000 mg | PREFILLED_SYRINGE | Freq: Every day | INTRAMUSCULAR | Status: DC
  Administered 2024-05-17 – 2024-05-20 (×4): 40 mg via SUBCUTANEOUS
  Filled 2024-05-17 (×2): qty 0.4

## 2024-05-17 NOTE — Progress Notes (Addendum)
 Multiple attempts to reorient patient as to the need for cardiac monitoring. Pt continues to pull off leads despite verbalizing understanding of necessity. Cardiac monitor becoming a source of agitation.   Pt insisting on going home via cab. He states that his family does not know where he is. States that he is being kept here against his will. Pt's son, bennet, was called and spoke to patient to help reorient/calm down/encourage patient to remain in hospital.

## 2024-05-17 NOTE — Plan of Care (Signed)

## 2024-05-17 NOTE — TOC Progression Note (Signed)
 Transition of Care [facility]) - Progression Note    Patient Details  Name: Jeremiah [name] MRN: [id number] Date of Birth: 05-19-1941  [facility]) CM/SW Contact  Othello Sgroi A Aitana Burry, LCSW Phone Number: 05/17/2024, 10:25 AM  Clinical Narrative:     CSW spoke with pt regarding recommendation for SNF. Pt stated he was agreeable to SNF at DC and had a preference for [facility]. CSW completed workup, bed offers pending. Pt stated he would speak with his family and update them accordingly on his preference.   Inpatient Case management team will continue to follow.   Expected Discharge Plan:  (tbd) Barriers to Discharge: Continued Medical Work up               Expected Discharge Plan and Services       Living arrangements for the past 2 months: Single Family Home                                       Social Drivers of Health (SDOH) Interventions SDOH Screenings   Food Insecurity: No Food Insecurity (05/14/2024)  Housing: Low Risk (05/14/2024)  Transportation Needs: No Transportation Needs (05/14/2024)  Utilities: Not At Risk (05/14/2024)  Depression (PHQ2-9): Low Risk (06/21/2022)  Social Connections: Socially Integrated (05/14/2024)  Tobacco Use: Medium Risk (05/14/2024)    Readmission Risk Interventions     No data to display

## 2024-05-17 NOTE — Progress Notes (Addendum)
 " PROGRESS NOTE Jeremiah [name]  FMW:[id number] DOB: 11-30-1940 DOA: 05/13/2024 PCP: Shayne Anes, MD   Brief Narrative:   Jeremiah [name] is a 83 y.o. 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  They presented with worsening dyspnea on exertion and fall. Patient has had issues with nonadherence to his Lasix  in past and CPAP/Bipap long term. Has had several very similar admissions in the past year.   ED Course: Vital signs in the ED notable for blood pressure in the 120s-140 systolic, respiratory rate in the 20s.  Requiring 4 to 5 L to maintain saturations. Lab workup included CMP with bicarb 36, BUN 56, creatinine elevated to 2.46 and baseline 1.3, glucose 150, protein 6.3.  CBC with hemoglobin 12.1 which is down from previous baseline at the beginning of the year of 14-15.  Platelets 148.  proBNP mildly elevated at 465.  Respiratory panel for flu COVID and RSV negative.  Chest x-ray showed chronic low lung volumes with right hemidiaphragm elevation and small right pleural effusion but no acute abnormality. CT chest showed small right pleural effusion similar ventriculopleural catheter. CT head showed no acute normality and chronic shunt in place. CT C-spine showed no acute abnormality and CT evidence of epidural venous engorgement possibly suggesting over shunting.  Assessment & Plan:  Acute on chronic respiratory failure with hypoxia and hypercarbia - 2-3L chronically, was on 10L HFNC.  This likely was hypoventilation due to severe respiratory alkalosis, he was likely compensating for the extremely high bicarb due to overdiuresis and contraction, he follows with [facility] pulmonology outpatient. Last seen 2/12. Denies SOB this am. He has normal WOB and is alert.  Chest CT and chest x-ray  nonacute, stable pleural effusion with history of chronic right ventriculopleural shunt catheter, with supportive care I-S and flutter valve along with nebulizer treatments now improving.  Asymptomatic.  Continue to monitor postdischarge follow-up with pulmonary.  SpO2: 92 % O2 Flow Rate (L/min): 4 L/min FiO2 (%): 40 %   AKI - baseline Cr of about 1.3, hydrate and monitor, improving  Hypernatremia - dehydrated, D5W, hold diuretics  Chronic diastolic CHF - Last echo showed EF 60 MC 5%, G1 DD, mildly reduced RV function. No evidence of volume overload on exam nor CT chest.  Continue Coreg  diuretics on hold   Metabolic encephalopathy - Head CT negative, likely from hypercarbia/hypoxia/dehydration/AKI. Appears to be close to baseline again per son description.   Fall/right hand swelling: Xray showing Oblique fracture of the fourth digit proximal phalanx - brace, buddy tape and ortho follow up, analgesia PRN  Normal pressure hydrocephalus  - Stable ventriculopleural shunt on CT head and CT chest. Continue outpatient follow-up  Hypertension - Holding diuretic,  Continue Coreg    Hyperlipidemia  - Continue home Zetia    History of prostate cancer - History of AAA  - Noted  DM 2 > 20 units long-acting insulin  daily at home held currently with low-normal blood sugars. Hgb A1c is 7.7. Moderate sliding scale    CBG (last 3)  Recent Labs    05/16/24 2116 05/17/24 0736 05/17/24 1138  GLUCAP 106* 139* 114*     DVT prophylaxis:      Lovenox  Code Status:    -  Full Family Communication:  daughter Corean on phone  Disposition Plan:  To be decided Consults called:        None Admission status:     Inpatient  Subjective:  Patient in bed, appears comfortable, denies any headache, no fever, no chest pain or pressure, no shortness of breath , no abdominal pain. No focal weakness.  Mildly confused.  objective: Vitals:   05/17/24 0412 05/17/24 0734 05/17/24 0816 05/17/24 0820   BP: (!) 140/80     Pulse:  75    Resp: 18     Temp: 97.7 F (36.5 C) 98.5 F (36.9 C)    TempSrc: Axillary Oral    SpO2:   93% 92%  Weight:      Height:        Intake/Output Summary (Last 24 hours) at 05/17/2024 1135 Last data filed at 05/17/2024 0700 Gross per 24 hour  Intake 374.88 ml  Output 1550 ml  Net -1175.12 ml   Filed Weights   05/13/24 1214 05/15/24 0500 05/16/24 0500  Weight: 91 kg 88.8 kg 86.7 kg   Examination:  Awake mildly confused, No new F.N deficits, Normal affect [place].AT,PERRAL Supple Neck, No JVD,   Symmetrical Chest wall movement, Good air movement bilaterally, CTAB RRR,No Gallops, Rubs or new Murmurs,  +ve B.Sounds, Abd distended some suprapubic tenderness No Cyanosis, Clubbing or edema     Data Review:   Patient Lines/Drains/Airways Status     Active Line/Drains/Airways     Name Placement date Placement time Site Days   Peripheral IV 05/16/24 20 G 1 Left;Posterior;Lateral Forearm 05/16/24  1058  Forearm  1   External Urinary Catheter 05/14/24  0553  --  3             Inpatient Medications  Scheduled Meds:  aspirin  EC  81 mg Oral Daily   budesonide  (PULMICORT ) nebulizer solution  0.5 mg Nebulization BID   carvedilol   12.5 mg Oral BID   enoxaparin  (LOVENOX ) injection  30 mg Subcutaneous QHS   ezetimibe   10 mg Oral Daily   insulin  aspart  0-15 Units Subcutaneous TID WC   ipratropium-albuterol   3 mL Nebulization Q6H   pantoprazole   20 mg Oral QAC breakfast   sodium chloride  flush  3 mL Intravenous Q12H   tamsulosin   0.4 mg Oral Daily   Continuous Infusions:  dextrose      PRN Meds:.acetaminophen  **OR** acetaminophen , polyethylene glycol  DVT Prophylaxis  enoxaparin  (LOVENOX ) injection 30 mg Start: 05/13/24 1600       Recent Labs  Lab 05/13/24 1254 05/13/24 1924 05/13/24 2218 05/14/24 0528 05/16/24 0416 05/17/24 0436  WBC 8.4  --   --  6.3 6.7 6.6  HGB 12.1* 12.2* 12.9* 11.2* 11.9* 11.8*  HCT 41.4 36.0* 38.0*  39.3 40.5 40.0  PLT 148*  --   --  149* 170 155  MCV 107.0*  --   --  107.4* 104.4* 104.7*  MCH 31.3  --   --  30.6 30.7 30.9  MCHC 29.2*  --   --  28.5* 29.4* 29.5*  RDW 14.2  --   --  14.0 13.7 13.5  LYMPHSABS 0.7  --   --   --   --  0.7  MONOABS 0.7  --   --   --   --  0.6  EOSABS 0.1  --   --   --   --  0.2  BASOSABS 0.0  --   --   --   --  0.0    Recent Labs  Lab 05/13/24 1254 05/13/24 1924  05/14/24 0528 05/15/24 0401 05/16/24 0416 05/16/24 1608 05/17/24 0436  NA 145   < > 149* 147* 150* 147* 147*  K 4.9   < > 4.6 5.1 4.4 4.1 4.2  CL 102  --  105 104 105 103 102  CO2 36*  --  37* 34* 38* 39* 38*  ANIONGAP 8  --  7 9 7 5 7   GLUCOSE 150*  --  110* 115* 113* 230* 137*  BUN 56*  --  49* 49* 39* 35* 34*  CREATININE 2.46*  --  1.99* 2.02* 1.62* 1.53* 1.46*  AST 31  --  20  --   --   --  18  ALT 21  --  16  --   --   --  9  ALKPHOS 76  --  68  --   --   --  68  BILITOT 0.4  --  0.4  --   --   --  0.4  ALBUMIN  3.5  --  3.3*  --   --   --  3.2*  INR  --   --   --   --   --   --  1.1  TSH  --   --   --   --   --  1.040  --   AMMONIA  --   --   --   --   --   --  31  MG  --   --   --   --   --   --  1.9  PHOS  --   --   --   --   --   --  2.5  CALCIUM  8.9  --  8.8* 8.9 9.0 9.1 8.7*   < > = values in this interval not displayed.      Recent Labs  Lab 05/14/24 0528 05/15/24 0401 05/16/24 0416 05/16/24 1608 05/17/24 0436  INR  --   --   --   --  1.1  TSH  --   --   --  1.040  --   AMMONIA  --   --   --   --  31  MG  --   --   --   --  1.9  CALCIUM  8.8* 8.9 9.0 9.1 8.7*    --------------------------------------------------------------------------------------------------------------- Lab Results  Component Value Date   CHOL 140 03/14/2022   HDL 44 03/14/2022   LDLCALC 66 03/14/2022   TRIG 181 (H) 03/14/2022   CHOLHDL 3.2 03/14/2022    Lab Results  Component Value Date   HGBA1C 7.7 (H) 04/28/2023   Recent Labs    05/16/24 1608 05/17/24 0436  TSH 1.040   --   FREET4  --  0.90   Radiology Reports  DG Chest Port 1 View Result Date: 05/17/2024 EXAM: 1 VIEW(S) XRAY OF THE CHEST 05/17/2024 07:21:00 AM COMPARISON: Portable chest 05/13/2024. CLINICAL HISTORY: SOB (shortness of breath) FINDINGS: LINES, TUBES AND DEVICES: Right sided VP shunt tubing courses over the chest. LUNGS AND PLEURA: There is increased central vascular congestion, developing mild central interstitial edema. There are pleural effusions with patchy opacities in the base of the lungs which are increasing, concerning for pneumonia or aspiration, but in the setting of low pulmonary inspiration. There is a chronically elevated right hemidiaphragm partially obscuring the right base. No pneumothorax. HEART AND MEDIASTINUM: Stable cardiomegaly. Aortic calcification and tortuosity again noted with stable mediastinum. BONES AND SOFT TISSUES: Thoracic spondylosis. No acute osseous  abnormality. IMPRESSION: 1. Increased central vascular congestion and developing mild central interstitial edema. 2. Pleural effusions with increasing patchy basilar opacities, concerning for pneumonia or aspiration in the setting of low pulmonary inspiration. 3. Cardiomegaly. Electronically signed by: Francis Quam MD 05/17/2024 08:04 AM EST RP Workstation: [id number]     Signature  -   Lavada Stank M.D on 05/17/2024 at 11:35 AM   -  To page go to [email or web address]   "

## 2024-05-17 NOTE — [place] FL2 (Signed)
 " [place]  MEDICAID FL2 LEVEL OF CARE FORM     IDENTIFICATION  Patient Name: Jeremiah [name] Birthdate: 01/27/1941 Sex: male Admission Date (Current Location): 05/13/2024  [facility] and [place] Number:  Producer, Television/film/video and Address:  [facility], [address] [place], [place] [address]      Provider Number: [id number]  Attending Physician Name and Address:  Dennise Lavada POUR, MD  Relative Name and Phone Number:  [facility]  Daughter,  [phone]    Current Level of Care: Hospital Recommended Level of Care: Skilled Nursing Facility Prior Approval Number:    Date Approved/Denied:   PASRR Number: [id number]  Discharge Plan: SNF    Current Diagnoses: Patient Active Problem List   Diagnosis Date Noted   AKI (acute kidney injury) 05/13/2024   Hypernatremia 04/27/2023   Toxic encephalopathy 02/02/2022   Chronic diastolic CHF (congestive heart failure) (HCC) 02/02/2022   HCAP (healthcare-associated pneumonia) 01/27/2022   Non-ST elevation (NSTEMI) myocardial infarction [facility])    Chest pressure 01/21/2022   Chronic respiratory failure with hypoxia, on home O2 therapy (HCC) - 2 L/min prn per patient 01/21/2022   DMII (diabetes mellitus, type 2) (HCC) 08/23/2020   Malnutrition of moderate degree 08/23/2020   NPH (normal pressure hydrocephalus) (HCC) 08/12/2019   Palliative care by specialist    Goals of care, counseling/discussion    DNR (do not resuscitate)    Muscular weakness    Xerostomia    Acute on chronic respiratory failure with hypoxia (HCC) 07/05/2019   Generalized weakness    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   HLD (hyperlipidemia) 06/10/2016   HTN (hypertension) 06/10/2016   AAA (abdominal aortic aneurysm) 06/10/2016   OSA on CPAP 06/10/2016   Malignant neoplasm of prostate s/p radiation 2015 03/10/2014    Orientation RESPIRATION BLADDER Height & Weight     Self, Situation, Place  O2 (4L) Incontinent,  External catheter Weight: 191 lb 2.2 oz (86.7 kg) Height:  5' 10 (177.8 cm)  BEHAVIORAL SYMPTOMS/MOOD NEUROLOGICAL BOWEL NUTRITION STATUS      Continent Diet (see DC summary)  AMBULATORY STATUS COMMUNICATION OF NEEDS Skin   Limited Assist Verbally Normal                       Personal Care Assistance Level of Assistance  Bathing, Feeding, Dressing Bathing Assistance: Limited assistance Feeding assistance: Limited assistance Dressing Assistance: Limited assistance     Functional Limitations Info  Sight, Hearing, Speech Sight Info: Impaired (reading glasses) Hearing Info: Adequate Speech Info: Adequate    SPECIAL CARE FACTORS FREQUENCY  PT (By licensed PT), OT (By licensed OT)     PT Frequency: 5x/week OT Frequency: 5x/week            Contractures Contractures Info: Not present    Additional Factors Info  Code Status, Allergies Code Status Info: FULL Allergies Info: Lipitor (Atorvastatin)  Maxipime  (Cefepime )           Current Medications (05/17/2024):  This is the current hospital active medication list Current Facility-Administered Medications  Medication Dose Route Frequency Provider Last Rate Last Admin   acetaminophen  (TYLENOL ) tablet 650 mg  650 mg Oral Q6H PRN Seena Marsa NOVAK, MD       Or   acetaminophen  (TYLENOL ) suppository 650 mg  650 mg Rectal Q6H PRN Seena Marsa NOVAK, MD       aspirin  EC tablet 81 mg  81 mg Oral Daily Melvin, Alexander B, MD  81 mg at 05/17/24 0940   budesonide  (PULMICORT ) nebulizer solution 0.5 mg  0.5 mg Nebulization BID Lenon Pons L, MD   0.5 mg at 05/17/24 0815   carvedilol  (COREG ) tablet 12.5 mg  12.5 mg Oral BID Melvin, Alexander B, MD   12.5 mg at 05/17/24 0940   dextrose  5 % solution   Intravenous Continuous Singh, Prashant K, MD 100 mL/hr at 05/17/24 0651 New Bag at 05/17/24 0651   enoxaparin  (LOVENOX ) injection 30 mg  30 mg Subcutaneous QHS Melvin, Alexander B, MD   30 mg at 05/16/24 2156   ezetimibe   (ZETIA ) tablet 10 mg  10 mg Oral Daily Melvin, Alexander B, MD   10 mg at 05/17/24 0940   insulin  aspart (novoLOG ) injection 0-15 Units  0-15 Units Subcutaneous TID WC Melvin, Alexander B, MD   2 Units at 05/17/24 0940   ipratropium-albuterol  (DUONEB) 0.5-2.5 (3) MG/3ML nebulizer solution 3 mL  3 mL Nebulization Q6H Lenon Pons CROME, MD   3 mL at 05/17/24 0820   pantoprazole  (PROTONIX ) EC tablet 20 mg  20 mg Oral QAC breakfast Melvin, Alexander B, MD   20 mg at 05/17/24 0940   polyethylene glycol (MIRALAX  / GLYCOLAX ) packet 17 g  17 g Oral Daily PRN Seena Marsa NOVAK, MD       sodium chloride  flush (NS) 0.9 % injection 3 mL  3 mL Intravenous Q12H Seena Marsa NOVAK, MD   3 mL at 05/17/24 9058   tamsulosin  (FLOMAX ) capsule 0.4 mg  0.4 mg Oral Daily Singh, Prashant K, MD   0.4 mg at 05/17/24 0940     Discharge Medications: Please see discharge summary for a list of discharge medications.  Relevant Imaging Results:  Relevant Lab Results:   Additional Information SSN [id number]  Malayshia All A Darnice Comrie, LCSW     "

## 2024-05-18 DIAGNOSIS — N179 Acute kidney failure, unspecified: Secondary | ICD-10-CM | POA: Diagnosis not present

## 2024-05-18 LAB — COMPREHENSIVE METABOLIC PANEL WITH GFR
ALT: 9 U/L (ref 0–44)
AST: 16 U/L (ref 15–41)
Albumin: 3.3 g/dL — ABNORMAL LOW (ref 3.5–5.0)
Alkaline Phosphatase: 66 U/L (ref 38–126)
Anion gap: 7 (ref 5–15)
BUN: 34 mg/dL — ABNORMAL HIGH (ref 8–23)
CO2: 37 mmol/L — ABNORMAL HIGH (ref 22–32)
Calcium: 8.8 mg/dL — ABNORMAL LOW (ref 8.9–10.3)
Chloride: 101 mmol/L (ref 98–111)
Creatinine, Ser: 1.6 mg/dL — ABNORMAL HIGH (ref 0.61–1.24)
GFR, Estimated: 42 mL/min — ABNORMAL LOW
Glucose, Bld: 141 mg/dL — ABNORMAL HIGH (ref 70–99)
Potassium: 4.3 mmol/L (ref 3.5–5.1)
Sodium: 145 mmol/L (ref 135–145)
Total Bilirubin: 0.4 mg/dL (ref 0.0–1.2)
Total Protein: 5.8 g/dL — ABNORMAL LOW (ref 6.5–8.1)

## 2024-05-18 LAB — AMMONIA: Ammonia: 33 umol/L (ref 9–35)

## 2024-05-18 LAB — PHOSPHORUS: Phosphorus: 3.2 mg/dL (ref 2.5–4.6)

## 2024-05-18 LAB — CBC WITH DIFFERENTIAL/PLATELET
Abs Immature Granulocytes: 0.08 K/uL — ABNORMAL HIGH (ref 0.00–0.07)
Basophils Absolute: 0 K/uL (ref 0.0–0.1)
Basophils Relative: 1 %
Eosinophils Absolute: 0.2 K/uL (ref 0.0–0.5)
Eosinophils Relative: 4 %
HCT: 38.6 % — ABNORMAL LOW (ref 39.0–52.0)
Hemoglobin: 11.7 g/dL — ABNORMAL LOW (ref 13.0–17.0)
Immature Granulocytes: 1 %
Lymphocytes Relative: 12 %
Lymphs Abs: 0.7 K/uL (ref 0.7–4.0)
MCH: 30.8 pg (ref 26.0–34.0)
MCHC: 30.3 g/dL (ref 30.0–36.0)
MCV: 101.6 fL — ABNORMAL HIGH (ref 80.0–100.0)
Monocytes Absolute: 0.5 K/uL (ref 0.1–1.0)
Monocytes Relative: 8 %
Neutro Abs: 4.5 K/uL (ref 1.7–7.7)
Neutrophils Relative %: 74 %
Platelets: 146 K/uL — ABNORMAL LOW (ref 150–400)
RBC: 3.8 MIL/uL — ABNORMAL LOW (ref 4.22–5.81)
RDW: 13.6 % (ref 11.5–15.5)
WBC: 6 K/uL (ref 4.0–10.5)
nRBC: 0 % (ref 0.0–0.2)

## 2024-05-18 LAB — GLUCOSE, CAPILLARY
Glucose-Capillary: 145 mg/dL — ABNORMAL HIGH (ref 70–99)
Glucose-Capillary: 177 mg/dL — ABNORMAL HIGH (ref 70–99)
Glucose-Capillary: 131 mg/dL — ABNORMAL HIGH (ref 70–99)
Glucose-Capillary: 153 mg/dL — ABNORMAL HIGH (ref 70–99)

## 2024-05-18 LAB — MAGNESIUM: Magnesium: 1.8 mg/dL (ref 1.7–2.4)

## 2024-05-18 MED ORDER — LACTATED RINGERS IV SOLN: INTRAVENOUS | Status: DC

## 2024-05-18 MED ORDER — IPRATROPIUM-ALBUTEROL 0.5-2.5 (3) MG/3ML IN SOLN: 3.0000 mL | Freq: Four times a day (QID) | RESPIRATORY_TRACT | Status: DC | PRN

## 2024-05-18 NOTE — Plan of Care (Signed)
  Problem: Coping: Goal: Ability to adjust to condition or change in health will improve Outcome: Progressing   Problem: Fluid Volume: Goal: Ability to maintain a balanced intake and output will improve Outcome: Progressing   Problem: Nutritional: Goal: Maintenance of adequate nutrition will improve Outcome: Progressing   Problem: Skin Integrity: Goal: Risk for impaired skin integrity will decrease Outcome: Progressing   Problem: Tissue Perfusion: Goal: Adequacy of tissue perfusion will improve Outcome: Progressing   

## 2024-05-18 NOTE — Progress Notes (Signed)
 Two episodes of epistaxis overnight. I suspect trauma from cannula plus dry mucosa. Changed to venti mask to attempt to reduce intranasal trauma and irritation.

## 2024-05-18 NOTE — Progress Notes (Signed)
 " PROGRESS NOTE ANDREA COLGLAZIER  FMW:[id number] DOB: Jun 22, 1940 DOA: 05/13/2024 PCP: Shayne Anes, MD   Brief Narrative:   Jeremiah [name] is a 83 y.o. 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  They presented with worsening dyspnea on exertion Jeremiah fall. Patient has had issues with nonadherence to his Lasix  in past Jeremiah CPAP/Bipap long term. Has had several very similar admissions in the past year.   ED Course: Vital signs in the ED notable for blood pressure in the 120s-140 systolic, respiratory rate in the 20s.  Requiring 4 to 5 L to maintain saturations. Lab workup included CMP with bicarb 36, BUN 56, creatinine elevated to 2.46 Jeremiah baseline 1.3, glucose 150, protein 6.3.  CBC with hemoglobin 12.1 which is down from previous baseline at the beginning of the year of 14-15.  Platelets 148.  proBNP mildly elevated at 465.  Respiratory panel for flu COVID Jeremiah RSV negative.  Chest x-ray showed chronic low lung volumes with right hemidiaphragm elevation Jeremiah small right pleural effusion but no acute abnormality. CT chest showed small right pleural effusion similar ventriculopleural catheter. CT head showed no acute normality Jeremiah chronic shunt in place. CT C-spine showed no acute abnormality Jeremiah CT evidence of epidural venous engorgement possibly suggesting over shunting.  Assessment & Plan:  Acute on chronic respiratory failure with hypoxia Jeremiah hypercarbia - uses 3 L nasal cannula oxygen  at home chronically, was on 10L HFNC.  This likely was hypoventilation due to severe respiratory alkalosis, he was likely compensating for the extremely high bicarb due to overdiuresis Jeremiah contraction, he follows with [facility] pulmonology outpatient. Last seen 2/12. Denies SOB this am. He has normal WOB Jeremiah is  alert.  Chest CT Jeremiah chest x-ray nonacute, stable pleural effusion with history of chronic right ventriculopleural shunt catheter, with supportive care I-S Jeremiah flutter valve along with nebulizer treatments now improving.  Still has atelectasis staff requested to place him in the chair with I-S Jeremiah flutter valve, currently asymptomatic Jeremiah has no complaints in terms of shortness of breath.  Continue to monitor postdischarge follow-up with pulmonary.  SpO2: 91 % O2 Flow Rate (L/min): 6 L/min FiO2 (%): 40 %   AKI - baseline Cr of about 1.3, hydrate Jeremiah monitor, improving  Hypernatremia - dehydrated, D5W, hold diuretics  Chronic diastolic CHF - Last echo showed EF 60 MC 5%, G1 DD, mildly reduced RV function. No evidence of volume overload on exam nor CT chest.  Continue Coreg , still appears dehydrated more IV fluids on 05/18/2024, continue to hold diuretics.   Metabolic encephalopathy - Head CT negative, likely from hypercarbia/hypoxia/dehydration/AKI. Appears to be close to baseline again per son description.   Fall/right hand swelling: Xray showing Oblique fracture of the fourth digit proximal phalanx - brace, buddy tape Jeremiah ortho follow up, analgesia PRN  Normal pressure hydrocephalus  - Stable ventriculopleural shunt on CT head Jeremiah CT chest. Continue outpatient follow-up  Hypertension - Holding diuretic,  Continue Coreg    Hyperlipidemia  - Continue home Zetia    History of prostate cancer - History of AAA  - Noted  DM 2 > 20 units long-acting insulin  daily at home held currently with low-normal blood sugars. Hgb A1c is 7.7. Moderate sliding scale    CBG (last 3)  Recent Labs    05/17/24 1607 05/17/24 2105 05/18/24 0746  GLUCAP 187* 170* 145*     DVT prophylaxis:      Lovenox  Code Status:    -  Full Family Communication:  daughter Corean on phone  Disposition Plan:              To be decided Consults called:        None Admission status:     Inpatient  Subjective:   Patient in bed, appears comfortable, denies any headache, no fever, no chest pain or pressure, no shortness of breath , no abdominal pain. No focal weakness.  Mildly confused.  objective: Vitals:   05/17/24 2322 05/18/24 0332 05/18/24 0457 05/18/24 0800  BP: (!) 110/51   (!) 153/75  Pulse:    73  Resp:    20  Temp: 97.7 F (36.5 C) 98 F (36.7 C)  97.6 F (36.4 C)  TempSrc: Axillary Axillary  Axillary  SpO2:    91%  Weight:   87.5 kg   Height:        Intake/Output Summary (Last 24 hours) at 05/18/2024 1003 Last data filed at 05/18/2024 0417 Gross per 24 hour  Intake --  Output 450 ml  Net -450 ml   Filed Weights   05/15/24 0500 05/16/24 0500 05/18/24 0457  Weight: 88.8 kg 86.7 kg 87.5 kg   Examination:  Awake mildly confused, No new F.N deficits, Normal affect [place].AT,PERRAL Supple Neck, No JVD,   Symmetrical Chest wall movement, Good air movement bilaterally, CTAB RRR,No Gallops, Rubs or new Murmurs,  +ve B.Sounds, Abd distended some suprapubic tenderness No Cyanosis, Clubbing or edema     Data Review:   Patient Lines/Drains/Airways Status     Active Line/Drains/Airways     Name Placement date Placement time Site Days   Peripheral IV 05/16/24 20 G 1 Left;Posterior;Lateral Forearm 05/16/24  1058  Forearm  2   External Urinary Catheter 05/14/24  0553  --  4             Inpatient Medications  Scheduled Meds:  aspirin  EC  81 mg Oral Daily   budesonide  (PULMICORT ) nebulizer solution  0.5 mg Nebulization BID   carvedilol   12.5 mg Oral BID   enoxaparin  (LOVENOX ) injection  40 mg Subcutaneous QHS   ezetimibe   10 mg Oral Daily   insulin  aspart  0-15 Units Subcutaneous TID WC   ipratropium-albuterol   3 mL Nebulization Q6H   pantoprazole   20 mg Oral QAC breakfast   sodium chloride  flush  3 mL Intravenous Q12H   tamsulosin   0.4 mg Oral Daily   Continuous Infusions:  lactated ringers      PRN Meds:.acetaminophen  **OR** acetaminophen , polyethylene  glycol  DVT Prophylaxis  enoxaparin  (LOVENOX ) injection 40 mg Start: 05/17/24 2200       Recent Labs  Lab 05/13/24 1254 05/13/24 1924 05/13/24 2218 05/14/24 0528 05/16/24 0416 05/17/24 0436 05/18/24 0637  WBC 8.4  --   --  6.3 6.7 6.6 6.0  HGB 12.1*   < > 12.9* 11.2* 11.9* 11.8* 11.7*  HCT 41.4   < > 38.0* 39.3 40.5 40.0 38.6*  PLT 148*  --   --  149* 170 155 146*  MCV 107.0*  --   --  107.4* 104.4* 104.7* 101.6*  MCH 31.3  --   --  30.6 30.7 30.9 30.8  MCHC 29.2*  --   --  28.5* 29.4* 29.5* 30.3  RDW 14.2  --   --  14.0 13.7 13.5 13.6  LYMPHSABS 0.7  --   --   --   --  0.7 0.7  MONOABS 0.7  --   --   --   --  0.6 0.5  EOSABS 0.1  --   --   --   --  0.2 0.2  BASOSABS 0.0  --   --   --   --  0.0 0.0   < > = values in this interval not displayed.    Recent Labs  Lab 05/13/24 1254 05/13/24 1924 05/14/24 0528 05/15/24 0401 05/16/24 0416 05/16/24 1608 05/17/24 0436 05/18/24 0637  NA 145   < > 149* 147* 150* 147* 147* 145  K 4.9   < > 4.6 5.1 4.4 4.1 4.2 4.3  CL 102  --  105 104 105 103 102 101  CO2 36*  --  37* 34* 38* 39* 38* 37*  ANIONGAP 8  --  7 9 7 5 7 7   GLUCOSE 150*  --  110* 115* 113* 230* 137* 141*  BUN 56*  --  49* 49* 39* 35* 34* 34*  CREATININE 2.46*  --  1.99* 2.02* 1.62* 1.53* 1.46* 1.60*  AST 31  --  20  --   --   --  18 16  ALT 21  --  16  --   --   --  9 9  ALKPHOS 76  --  68  --   --   --  68 66  BILITOT 0.4  --  0.4  --   --   --  0.4 0.4  ALBUMIN  3.5  --  3.3*  --   --   --  3.2* 3.3*  INR  --   --   --   --   --   --  1.1  --   TSH  --   --   --   --   --  1.040  --   --   AMMONIA  --   --   --   --   --   --  31 33  MG  --   --   --   --   --   --  1.9 1.8  PHOS  --   --   --   --   --   --  2.5 3.2  CALCIUM  8.9  --  8.8* 8.9 9.0 9.1 8.7* 8.8*   < > = values in this interval not displayed.      Recent Labs  Lab 05/15/24 0401 05/16/24 0416 05/16/24 1608 05/17/24 0436 05/18/24 0637  INR  --   --   --  1.1  --   TSH  --   --   1.040  --   --   AMMONIA  --   --   --  31 33  MG  --   --   --  1.9 1.8  CALCIUM  8.9 9.0 9.1 8.7* 8.8*    --------------------------------------------------------------------------------------------------------------- Lab Results  Component Value Date   CHOL 140 03/14/2022   HDL 44 03/14/2022   LDLCALC 66 03/14/2022   TRIG 181 (H) 03/14/2022   CHOLHDL 3.2 03/14/2022    Lab Results  Component Value Date   HGBA1C 7.7 (H) 04/28/2023   Recent Labs    05/16/24 1608 05/17/24 0436  TSH 1.040  --   FREET4  --  0.90   Radiology Reports  DG Chest Port 1 View Result Date: 05/17/2024 EXAM: 1 VIEW(S) XRAY OF THE CHEST 05/17/2024 07:21:00 AM COMPARISON: Portable chest 05/13/2024. CLINICAL HISTORY: SOB (shortness  of breath) FINDINGS: LINES, TUBES Jeremiah DEVICES: Right sided VP shunt tubing courses over the chest. LUNGS Jeremiah PLEURA: There is increased central vascular congestion, developing mild central interstitial edema. There are pleural effusions with patchy opacities in the base of the lungs which are increasing, concerning for pneumonia or aspiration, but in the setting of low pulmonary inspiration. There is a chronically elevated right hemidiaphragm partially obscuring the right base. No pneumothorax. HEART Jeremiah MEDIASTINUM: Stable cardiomegaly. Aortic calcification Jeremiah tortuosity again noted with stable mediastinum. BONES Jeremiah SOFT TISSUES: Thoracic spondylosis. No acute osseous abnormality. IMPRESSION: 1. Increased central vascular congestion Jeremiah developing mild central interstitial edema. 2. Pleural effusions with increasing patchy basilar opacities, concerning for pneumonia or aspiration in the setting of low pulmonary inspiration. 3. Cardiomegaly. Electronically signed by: Francis Quam MD 05/17/2024 08:04 AM EST RP Workstation: [id number]     Signature  -   Lavada Stank M.D on 05/18/2024 at 10:03 AM   -  To page go to [email or web address]   "

## 2024-05-19 ENCOUNTER — Inpatient Hospital Stay (HOSPITAL_COMMUNITY)

## 2024-05-19 DIAGNOSIS — J9611 Chronic respiratory failure with hypoxia: Secondary | ICD-10-CM

## 2024-05-19 DIAGNOSIS — Z9981 Dependence on supplemental oxygen: Secondary | ICD-10-CM | POA: Diagnosis not present

## 2024-05-19 LAB — CBC WITH DIFFERENTIAL/PLATELET
Abs Immature Granulocytes: 0.11 K/uL — ABNORMAL HIGH (ref 0.00–0.07)
Basophils Absolute: 0 K/uL (ref 0.0–0.1)
Basophils Relative: 0 %
Eosinophils Absolute: 0.2 K/uL (ref 0.0–0.5)
Eosinophils Relative: 4 %
HCT: 40.6 % (ref 39.0–52.0)
Hemoglobin: 12.1 g/dL — ABNORMAL LOW (ref 13.0–17.0)
Immature Granulocytes: 2 %
Lymphocytes Relative: 11 %
Lymphs Abs: 0.7 K/uL (ref 0.7–4.0)
MCH: 30.5 pg (ref 26.0–34.0)
MCHC: 29.8 g/dL — ABNORMAL LOW (ref 30.0–36.0)
MCV: 102.3 fL — ABNORMAL HIGH (ref 80.0–100.0)
Monocytes Absolute: 0.4 K/uL (ref 0.1–1.0)
Monocytes Relative: 7 %
Neutro Abs: 4.5 K/uL (ref 1.7–7.7)
Neutrophils Relative %: 76 %
Platelets: 164 K/uL (ref 150–400)
RBC: 3.97 MIL/uL — ABNORMAL LOW (ref 4.22–5.81)
RDW: 13.5 % (ref 11.5–15.5)
WBC: 5.9 K/uL (ref 4.0–10.5)
nRBC: 0 % (ref 0.0–0.2)

## 2024-05-19 LAB — COMPREHENSIVE METABOLIC PANEL WITH GFR
ALT: 10 U/L (ref 0–44)
AST: 17 U/L (ref 15–41)
Albumin: 3.4 g/dL — ABNORMAL LOW (ref 3.5–5.0)
Alkaline Phosphatase: 64 U/L (ref 38–126)
Anion gap: 7 (ref 5–15)
BUN: 32 mg/dL — ABNORMAL HIGH (ref 8–23)
CO2: 35 mmol/L — ABNORMAL HIGH (ref 22–32)
Calcium: 9 mg/dL (ref 8.9–10.3)
Chloride: 100 mmol/L (ref 98–111)
Creatinine, Ser: 1.52 mg/dL — ABNORMAL HIGH (ref 0.61–1.24)
GFR, Estimated: 45 mL/min — ABNORMAL LOW
Glucose, Bld: 134 mg/dL — ABNORMAL HIGH (ref 70–99)
Potassium: 4.3 mmol/L (ref 3.5–5.1)
Sodium: 142 mmol/L (ref 135–145)
Total Bilirubin: 0.4 mg/dL (ref 0.0–1.2)
Total Protein: 5.7 g/dL — ABNORMAL LOW (ref 6.5–8.1)

## 2024-05-19 LAB — PHOSPHORUS: Phosphorus: 3 mg/dL (ref 2.5–4.6)

## 2024-05-19 LAB — GLUCOSE, CAPILLARY
Glucose-Capillary: 135 mg/dL — ABNORMAL HIGH (ref 70–99)
Glucose-Capillary: 222 mg/dL — ABNORMAL HIGH (ref 70–99)
Glucose-Capillary: 104 mg/dL — ABNORMAL HIGH (ref 70–99)
Glucose-Capillary: 183 mg/dL — ABNORMAL HIGH (ref 70–99)

## 2024-05-19 LAB — AMMONIA: Ammonia: 35 umol/L (ref 9–35)

## 2024-05-19 LAB — MAGNESIUM: Magnesium: 1.7 mg/dL (ref 1.7–2.4)

## 2024-05-19 MED ORDER — FUROSEMIDE 10 MG/ML IJ SOLN
20.0000 mg | Freq: Once | INTRAMUSCULAR | Status: AC
  Administered 2024-05-19: 20 mg via INTRAVENOUS
  Filled 2024-05-19: qty 2

## 2024-05-19 NOTE — Plan of Care (Signed)
" °  Problem: Metabolic: Goal: Ability to maintain appropriate glucose levels will improve Outcome: Progressing   Problem: Nutritional: Goal: Maintenance of adequate nutrition will improve Outcome: Progressing   Problem: Education: Goal: Knowledge of General Education information will improve Description: Including pain rating scale, medication(s)/side effects and non-pharmacologic comfort measures Outcome: Progressing   Problem: Clinical Measurements: Goal: Respiratory complications will improve Outcome: Progressing Goal: Cardiovascular complication will be avoided Outcome: Progressing   Problem: Clinical Measurements: Goal: Cardiovascular complication will be avoided Outcome: Progressing   Problem: Activity: Goal: Risk for activity intolerance will decrease Outcome: Progressing   Problem: Nutrition: Goal: Adequate nutrition will be maintained Outcome: Progressing   "

## 2024-05-19 NOTE — Plan of Care (Signed)
" °  Problem: Skin Integrity: Goal: Risk for impaired skin integrity will decrease Outcome: Progressing   Problem: Tissue Perfusion: Goal: Adequacy of tissue perfusion will improve Outcome: Progressing   Problem: Clinical Measurements: Goal: Diagnostic test results will improve Outcome: Progressing Goal: Respiratory complications will improve Outcome: Progressing Goal: Cardiovascular complication will be avoided Outcome: Progressing   "

## 2024-05-19 NOTE — Plan of Care (Signed)
  Problem: Education: Goal: Ability to describe self-care measures that may prevent or decrease complications (Diabetes Survival Skills Education) will improve Outcome: Progressing Goal: Individualized Educational Video(s) Outcome: Progressing   Problem: Fluid Volume: Goal: Ability to maintain a balanced intake and output will improve Outcome: Progressing   Problem: Health Behavior/Discharge Planning: Goal: Ability to identify and utilize available resources and services will improve Outcome: Progressing Goal: Ability to manage health-related needs will improve Outcome: Progressing   Problem: Metabolic: Goal: Ability to maintain appropriate glucose levels will improve Outcome: Progressing   Problem: Nutritional: Goal: Maintenance of adequate nutrition will improve Outcome: Progressing Goal: Progress toward achieving an optimal weight will improve Outcome: Progressing   Problem: Skin Integrity: Goal: Risk for impaired skin integrity will decrease Outcome: Progressing   Problem: Tissue Perfusion: Goal: Adequacy of tissue perfusion will improve Outcome: Progressing   Problem: Education: Goal: Knowledge of General Education information will improve Description: Including pain rating scale, medication(s)/side effects and non-pharmacologic comfort measures Outcome: Progressing   Problem: Health Behavior/Discharge Planning: Goal: Ability to manage health-related needs will improve Outcome: Progressing   Problem: Clinical Measurements: Goal: Ability to maintain clinical measurements within normal limits will improve Outcome: Progressing Goal: Will remain free from infection Outcome: Progressing Goal: Diagnostic test results will improve Outcome: Progressing Goal: Respiratory complications will improve Outcome: Progressing Goal: Cardiovascular complication will be avoided Outcome: Progressing   Problem: Activity: Goal: Risk for activity intolerance will  decrease Outcome: Progressing   Problem: Nutrition: Goal: Adequate nutrition will be maintained Outcome: Progressing   Problem: Coping: Goal: Level of anxiety will decrease Outcome: Progressing   Problem: Elimination: Goal: Will not experience complications related to bowel motility Outcome: Progressing Goal: Will not experience complications related to urinary retention Outcome: Progressing   Problem: Pain Managment: Goal: General experience of comfort will improve and/or be controlled Outcome: Progressing   Problem: Safety: Goal: Ability to remain free from injury will improve Outcome: Progressing   Problem: Skin Integrity: Goal: Risk for impaired skin integrity will decrease Outcome: Progressing

## 2024-05-19 NOTE — Care Management Important Message (Signed)
 Important Message  Patient Details  Name: Jeremiah [name] MRN: [id number] Date of Birth: Feb 05, 1941   Important Message Given:  Yes - Medicare IM     Claretta Deed 05/19/2024, 3:05 PM

## 2024-05-19 NOTE — Progress Notes (Signed)
 Physical Therapy Treatment Patient Details Name: Jeremiah [name] MRN: [id number] DOB: [birth year] Today's Date: 05/19/2024   History of Present Illness 83 y.o. male presents to [facility] 05/13/24 after fall with worsening dyspnea w/ exertion. Admitted with acute on chronic respiratory failure and metabolic encephalopathy. Imaging showed oblique fx of R 4th digit proximal phalanx. 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 Pt able to tolerate increased gait distance with CGA for safety. Pt able to perform additional STS trials and static standing marches after a seated rest break. Pt requires increase to 3L O2 during ambulation to maintain >89%. Noted finger splint doffed, RN notified. Pt continues to benefit from PT services to progress toward functional mobility goals.    If plan is discharge home, recommend the following: A little help with walking and/or transfers;A little help with bathing/dressing/bathroom;Assist for transportation;Help with stairs or ramp for entrance   Can travel by private vehicle     Yes  Equipment Recommendations  None recommended by PT    Recommendations for Other Services       Precautions / Restrictions Precautions Precautions: Fall Recall of Precautions/Restrictions: Impaired Precaution/Restrictions Comments: right hand 4th digit proximal phalanx fx Splint/Cast: splint on R 4th digit Restrictions Weight Bearing Restrictions Per Provider Order: No RUE Weight Bearing Per Provider Order: Weight bear through elbow only Other Position/Activity Restrictions: no order for weight bearing status but try to eliminate weightbearing into hand d/t fracture.     Mobility  Bed Mobility               General bed mobility comments: Pt  in recliner at beginning and end of session    Transfers Overall transfer level: Needs assistance Equipment used: Rolling walker (2 wheels) Transfers: Sit to/from Stand Sit to Stand: Contact guard assist           General transfer comment: cues for hand placement    Ambulation/Gait Ambulation/Gait assistance: Contact guard assist Gait Distance (Feet): 170 Feet Assistive device: Rolling walker (2 wheels) Gait Pattern/deviations: Step-through pattern, Decreased stride length, Trunk flexed Gait velocity: decr     General Gait Details: good stability with RW support. cues for upright posture   Stairs             Wheelchair Mobility     Tilt Bed    Modified Rankin (Stroke Patients Only)       Balance Overall balance assessment: History of Falls, Needs assistance Sitting-balance support: No upper extremity supported, Feet supported Sitting balance-Leahy Scale: Fair     Standing balance support: Bilateral upper extremity supported, During functional activity, Reliant on assistive device for balance Standing balance-Leahy Scale: Poor Standing balance comment: with RW support                            Communication Communication Communication: No apparent difficulties  Cognition Arousal: Alert Behavior During Therapy: WFL for tasks assessed/performed, Impulsive   PT - Cognitive impairments: No family/caregiver present to determine baseline, Problem solving, Safety/Judgement, Memory                       PT - Cognition Comments: Noted pt's splint doffed and pt reports he took it off, but could not specify when  Following commands: Intact      Cueing Cueing Techniques: Verbal cues  Exercises General Exercises - Lower Extremity Hip Flexion/Marching: AROM, Both, 10 reps, Standing Other Exercises Other Exercises: x5 serial STS    General Comments General comments (skin integrity, edema, etc.): SpO2 88% on 2L during mobility, improved  to >90% on 3L      Pertinent Vitals/Pain Pain Assessment Pain Assessment: No/denies pain     PT Goals (current goals can now be found in the care plan section) Acute Rehab PT Goals Patient Stated Goal: to go home PT Goal Formulation: With patient Time For Goal Achievement: 05/30/24 Progress towards PT goals: Progressing toward goals    Frequency    Min 2X/week       AM-PAC PT 6 Clicks Mobility   Outcome Measure  Help needed turning from your back to your side while in a flat bed without using bedrails?: A Little Help needed moving from lying on your back to sitting on the side of a flat bed without using bedrails?: A Little Help needed moving to and from a bed to a chair (including a wheelchair)?: A Little Help needed standing up from a chair using your arms (e.g., wheelchair or bedside chair)?: A Little Help needed to walk in hospital room?: A Little Help needed climbing 3-5 steps with a railing? : A Lot 6 Click Score: 17    End of Session Equipment Utilized During Treatment: Oxygen ;Gait belt Activity Tolerance: Patient tolerated treatment well Patient left: in chair;with call bell/phone within reach;with chair alarm set Nurse Communication: Mobility status PT Visit Diagnosis: Unsteadiness on feet (R26.81);Other abnormalities of gait and mobility (R26.89);Muscle weakness (generalized) (M62.81);History of falling (Z91.81)     Time: 8885-8861 PT Time Calculation (min) (ACUTE ONLY): 24 min  Charges:    $Gait Training: 8-22 mins $Therapeutic Exercise: 8-22 mins PT General Charges $$ ACUTE PT VISIT: 1 Visit                    Darryle George, PTA Acute Rehabilitation Services Secure Chat Preferred  [phone]    Darryle George 05/19/2024, 1:26 PM

## 2024-05-19 NOTE — TOC Progression Note (Addendum)
 Transition of Care [facility]) - Progression Note    Patient Details  Name: COPE MARTE MRN: [id number] Date of Birth: March 20, 1941  [facility]) CM/SW Contact  Inocente GORMAN Kindle, LCSW Phone Number: 05/19/2024, 9:04 AM  Clinical Narrative:    9:04 AM-CSW requested [facility] review referral.   3:28 PM-Sent updated therapy note to [facility]; Awaiting response.   4:26 PM-[facility] able to accept patient pending insurance approval. CSW initiated insurance process, Ref# [id number].  Left secure vm for patient's daughter.   Expected Discharge Plan: Skilled Nursing Facility Barriers to Discharge: Insurance Authorization            Expected Discharge Plan and Services In-house Referral: Clinical Social Work   Post Acute Care Choice: Skilled Nursing Facility Living arrangements for the past 2 months: Single Family Home                                       Social Drivers of Health (SDOH) Interventions SDOH Screenings   Food Insecurity: No Food Insecurity (05/14/2024)  Housing: Low Risk (05/14/2024)  Transportation Needs: No Transportation Needs (05/14/2024)  Utilities: Not At Risk (05/14/2024)  Depression (PHQ2-9): Low Risk (06/21/2022)  Social Connections: Socially Integrated (05/14/2024)  Tobacco Use: Medium Risk (05/14/2024)    Readmission Risk Interventions     No data to display

## 2024-05-19 NOTE — Progress Notes (Signed)
 " PROGRESS NOTE Jeremiah [name]  FMW:[id number] DOB: 06-Sep-1940 DOA: 05/13/2024 PCP: Shayne Anes, MD   Brief Narrative:   Jeremiah [name] is a 83 y.o. 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  They presented with worsening dyspnea on exertion and fall. Patient has had issues with nonadherence to his Lasix  in past and CPAP/Bipap long term. Has had several very similar admissions in the past year.   ED Course: Vital signs in the ED notable for blood pressure in the 120s-140 systolic, respiratory rate in the 20s.  Requiring 4 to 5 L to maintain saturations. Lab workup included CMP with bicarb 36, BUN 56, creatinine elevated to 2.46 and baseline 1.3, glucose 150, protein 6.3.  CBC with hemoglobin 12.1 which is down from previous baseline at the beginning of the year of 14-15.  Platelets 148.  proBNP mildly elevated at 465.  Respiratory panel for flu COVID and RSV negative.  Chest x-ray showed chronic low lung volumes with right hemidiaphragm elevation and small right pleural effusion but no acute abnormality. CT chest showed small right pleural effusion similar ventriculopleural catheter. CT head showed no acute normality and chronic shunt in place. CT C-spine showed no acute abnormality and CT evidence of epidural venous engorgement possibly suggesting over shunting.  Assessment & Plan:  Acute on chronic respiratory failure with hypoxia and hypercarbia - uses 3 L nasal cannula oxygen  at home chronically, was on 10L HFNC.  This likely was hypoventilation due to severe respiratory alkalosis, he was likely compensating for the extremely high bicarb due to overdiuresis and contraction, he follows with [facility] pulmonology outpatient. Last seen 2/12. Denies SOB this am. He has normal WOB and is  alert.  Chest CT and chest x-ray nonacute, stable pleural effusion with history of chronic right ventriculopleural shunt catheter, with supportive care I-S and flutter valve along with nebulizer treatments now improving.  Still has atelectasis staff requested to place him in the chair with I-S and flutter valve, currently asymptomatic and has no complaints in terms of shortness of breath.  Continue to monitor postdischarge follow-up with pulmonary.  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 No evidence of volume overload on exam nor CT chest.  Continue Coreg , still appears dehydrated more IV fluids on 05/18/2024, continue to hold diuretics.   Metabolic encephalopathy - Head CT negative, likely from hypercarbia/hypoxia/dehydration/AKI. Appears to be close to baseline again per son description.   Fall/right hand swelling: Xray showing Oblique fracture of the fourth digit proximal phalanx - brace, buddy tape and ortho follow up, analgesia PRN  Normal pressure hydrocephalus  - Stable ventriculopleural shunt on CT head and CT chest. Continue outpatient follow-up  Hypertension - continue Coreg    Hyperlipidemia  - Continue home Zetia    History of prostate cancer - History of AAA  - Noted  DM 2 > 20 units long-acting insulin  daily at home held currently with low-normal blood sugars. Hgb A1c is 7.7. Moderate sliding scale    CBG (last 3)  Recent Labs    05/18/24 1600 05/18/24 2246 05/19/24 0758  GLUCAP 131* 153* 135*     DVT prophylaxis:  Lovenox  Code Status:    -  Full Family Communication:  daughter Corean on phone [phone] on 05/17/2024, 05/19/2024 message left at 8:17 AM. Disposition Plan:              To be decided Consults called:        None Admission status:     Inpatient  Subjective:  Patient in bed,  appears comfortable, denies any headache, no fever, no chest pain or pressure, no shortness of breath , no abdominal pain. No focal weakness.  Mildly confused.  objective: Vitals:   05/19/24 0300 05/19/24 0310 05/19/24 0757 05/19/24 0758  BP:  (!) 168/83  (!) 160/78  Pulse:  84  81  Resp:    16  Temp: 98.1 F (36.7 C)   97.7 F (36.5 C)  TempSrc: Axillary   Axillary  SpO2:  (!) 82% 92% (!) 88%  Weight:      Height:        Intake/Output Summary (Last 24 hours) at 05/19/2024 0815 Last data filed at 05/19/2024 0500 Gross per 24 hour  Intake 258.91 ml  Output 1100 ml  Net -841.09 ml   Filed Weights   05/15/24 0500 05/16/24 0500 05/18/24 0457  Weight: 88.8 kg 86.7 kg 87.5 kg   Examination:  Awake mildly confused, No new F.N deficits, Normal affect [place].AT,PERRAL Supple Neck, No JVD,   Symmetrical Chest wall movement, Good air movement bilaterally, CTAB RRR,No Gallops, Rubs or new Murmurs,  +ve B.Sounds, abdomen nontender No Cyanosis, Clubbing or edema     Data Review:   Patient Lines/Drains/Airways Status     Active Line/Drains/Airways     Name Placement date Placement time Site Days   Peripheral IV 05/16/24 20 G 1 Left;Posterior;Lateral Forearm 05/16/24  1058  Forearm  3   External Urinary Catheter 05/14/24  0553  --  5             Inpatient Medications  Scheduled Meds:  aspirin  EC  81 mg Oral Daily   budesonide  (PULMICORT ) nebulizer solution  0.5 mg Nebulization BID   carvedilol   12.5 mg Oral BID   enoxaparin  (LOVENOX ) injection  40 mg Subcutaneous QHS   ezetimibe   10 mg Oral Daily   furosemide   20 mg Intravenous Once   insulin  aspart  0-15 Units Subcutaneous TID WC   pantoprazole   20 mg Oral QAC breakfast   sodium chloride  flush  3 mL Intravenous Q12H   tamsulosin   0.4 mg Oral Daily   Continuous Infusions:   PRN Meds:.acetaminophen  **OR** acetaminophen , ipratropium-albuterol , polyethylene glycol  DVT Prophylaxis  enoxaparin  (LOVENOX ) injection  40 mg Start: 05/17/24 2200   Recent Labs  Lab 05/13/24 1254 05/13/24 1924 05/14/24 0528 05/16/24 0416 05/17/24 0436 05/18/24 0637 05/19/24 0458  WBC 8.4  --  6.3 6.7 6.6 6.0 5.9  HGB 12.1*   < > 11.2* 11.9* 11.8* 11.7* 12.1*  HCT 41.4   < > 39.3 40.5 40.0 38.6* 40.6  PLT 148*  --  149* 170 155 146* 164  MCV 107.0*  --  107.4* 104.4* 104.7* 101.6* 102.3*  MCH 31.3  --  30.6 30.7 30.9 30.8 30.5  MCHC 29.2*  --  28.5* 29.4* 29.5* 30.3 29.8*  RDW 14.2  --  14.0 13.7 13.5 13.6 13.5  LYMPHSABS 0.7  --   --   --  0.7 0.7 0.7  MONOABS 0.7  --   --   --  0.6 0.5 0.4  EOSABS 0.1  --   --   --  0.2 0.2 0.2  BASOSABS 0.0  --   --   --  0.0 0.0 0.0   < > = values in this interval not displayed.    Recent Labs  Lab 05/13/24 1254 05/13/24 1924 05/14/24 0528 05/15/24 0401 05/16/24 0416 05/16/24 1608 05/17/24 0436 05/18/24 0637 05/19/24 0458  NA 145   < > 149*   < > 150* 147* 147* 145 142  K 4.9   < > 4.6   < > 4.4 4.1 4.2 4.3 4.3  CL 102  --  105   < > 105 103 102 101 100  CO2 36*  --  37*   < > 38* 39* 38* 37* 35*  ANIONGAP 8  --  7   < > 7 5 7 7 7   GLUCOSE 150*  --  110*   < > 113* 230* 137* 141* 134*  BUN 56*  --  49*   < > 39* 35* 34* 34* 32*  CREATININE 2.46*  --  1.99*   < > 1.62* 1.53* 1.46* 1.60* 1.52*  AST 31  --  20  --   --   --  18 16 17   ALT 21  --  16  --   --   --  9 9 10   ALKPHOS 76  --  68  --   --   --  68 66 64  BILITOT 0.4  --  0.4  --   --   --  0.4 0.4 0.4  ALBUMIN  3.5  --  3.3*  --   --   --  3.2* 3.3* 3.4*  INR  --   --   --   --   --   --  1.1  --   --   TSH  --   --   --   --   --  1.040  --   --   --   AMMONIA  --   --   --   --   --   --  31 33 35  MG  --   --   --   --   --   --  1.9 1.8 1.7  PHOS  --   --   --   --   --   --  2.5 3.2 3.0  CALCIUM  8.9  --  8.8*   < > 9.0 9.1 8.7* 8.8* 9.0   < > = values in this interval not displayed.      Recent Labs  Lab 05/16/24 0416 05/16/24 1608 05/17/24 0436 05/18/24 0637 05/19/24 0458  INR  --    --  1.1  --   --   TSH  --  1.040  --   --   --   AMMONIA  --   --  31 33 35  MG  --   --  1.9 1.8 1.7  CALCIUM  9.0 9.1 8.7* 8.8* 9.0    --------------------------------------------------------------------------------------------------------------- Lab Results  Component Value Date   CHOL 140 03/14/2022   HDL 44 03/14/2022   LDLCALC 66 03/14/2022   TRIG 181 (H) 03/14/2022   CHOLHDL 3.2 03/14/2022    Lab Results  Component Value Date   HGBA1C 7.7 (H) 04/28/2023   Recent Labs    05/16/24 1608 05/17/24 0436  TSH 1.040  --   FREET4  --  0.90   Radiology Reports  No results found.    Signature  -   Lavada Stank  M.D on 05/19/2024 at 8:15 AM   -  To page go to [email or web address]   "

## 2024-05-19 NOTE — Care Management Important Message (Signed)
 Important Message  Patient Details  Name: Jeremiah [name] MRN: [id number] Date of Birth: March 20, 1941   Important Message Given:  Yes - Medicare IM     Claretta Deed 05/19/2024, 3:10 PM

## 2024-05-20 DIAGNOSIS — Z9981 Dependence on supplemental oxygen: Secondary | ICD-10-CM | POA: Diagnosis not present

## 2024-05-20 DIAGNOSIS — W19XXXA Unspecified fall, initial encounter: Secondary | ICD-10-CM | POA: Diagnosis not present

## 2024-05-20 DIAGNOSIS — J9611 Chronic respiratory failure with hypoxia: Secondary | ICD-10-CM | POA: Diagnosis not present

## 2024-05-20 LAB — COMPREHENSIVE METABOLIC PANEL WITH GFR
ALT: 12 U/L (ref 0–44)
AST: 20 U/L (ref 15–41)
Albumin: 3.3 g/dL — ABNORMAL LOW (ref 3.5–5.0)
Alkaline Phosphatase: 62 U/L (ref 38–126)
Anion gap: 7 (ref 5–15)
BUN: 32 mg/dL — ABNORMAL HIGH (ref 8–23)
CO2: 36 mmol/L — ABNORMAL HIGH (ref 22–32)
Calcium: 8.9 mg/dL (ref 8.9–10.3)
Chloride: 99 mmol/L (ref 98–111)
Creatinine, Ser: 1.46 mg/dL — ABNORMAL HIGH (ref 0.61–1.24)
GFR, Estimated: 47 mL/min — ABNORMAL LOW
Glucose, Bld: 137 mg/dL — ABNORMAL HIGH (ref 70–99)
Potassium: 4.1 mmol/L (ref 3.5–5.1)
Sodium: 143 mmol/L (ref 135–145)
Total Bilirubin: 0.3 mg/dL (ref 0.0–1.2)
Total Protein: 5.6 g/dL — ABNORMAL LOW (ref 6.5–8.1)

## 2024-05-20 LAB — CBC WITH DIFFERENTIAL/PLATELET
Abs Immature Granulocytes: 0.09 K/uL — ABNORMAL HIGH (ref 0.00–0.07)
Basophils Absolute: 0 K/uL (ref 0.0–0.1)
Basophils Relative: 1 %
Eosinophils Absolute: 0.2 K/uL (ref 0.0–0.5)
Eosinophils Relative: 4 %
HCT: 38.7 % — ABNORMAL LOW (ref 39.0–52.0)
Hemoglobin: 11.9 g/dL — ABNORMAL LOW (ref 13.0–17.0)
Immature Granulocytes: 2 %
Lymphocytes Relative: 11 %
Lymphs Abs: 0.6 K/uL — ABNORMAL LOW (ref 0.7–4.0)
MCH: 30.9 pg (ref 26.0–34.0)
MCHC: 30.7 g/dL (ref 30.0–36.0)
MCV: 100.5 fL — ABNORMAL HIGH (ref 80.0–100.0)
Monocytes Absolute: 0.4 K/uL (ref 0.1–1.0)
Monocytes Relative: 7 %
Neutro Abs: 4.4 K/uL (ref 1.7–7.7)
Neutrophils Relative %: 75 %
Platelets: 171 K/uL (ref 150–400)
RBC: 3.85 MIL/uL — ABNORMAL LOW (ref 4.22–5.81)
RDW: 13.5 % (ref 11.5–15.5)
WBC: 5.8 K/uL (ref 4.0–10.5)
nRBC: 0 % (ref 0.0–0.2)

## 2024-05-20 LAB — MAGNESIUM: Magnesium: 1.7 mg/dL (ref 1.7–2.4)

## 2024-05-20 LAB — GLUCOSE, CAPILLARY
Glucose-Capillary: 156 mg/dL — ABNORMAL HIGH (ref 70–99)
Glucose-Capillary: 182 mg/dL — ABNORMAL HIGH (ref 70–99)
Glucose-Capillary: 107 mg/dL — ABNORMAL HIGH (ref 70–99)
Glucose-Capillary: 147 mg/dL — ABNORMAL HIGH (ref 70–99)

## 2024-05-20 LAB — AMMONIA: Ammonia: 38 umol/L — ABNORMAL HIGH (ref 9–35)

## 2024-05-20 LAB — PHOSPHORUS: Phosphorus: 3.5 mg/dL (ref 2.5–4.6)

## 2024-05-20 MED ORDER — ENSURE PLUS HIGH PROTEIN PO LIQD
237.0000 mL | Freq: Two times a day (BID) | ORAL | Status: DC
  Administered 2024-05-20 – 2024-05-21 (×3): 237 mL via ORAL

## 2024-05-20 NOTE — TOC Progression Note (Signed)
 Transition of Care [facility]) - Progression Note    Patient Details  Name: Jeremiah [name] MRN: [id number] Date of Birth: November 25, 1940  Transition of Care [facility] CM/SW Contact  Andrez JULIANNA George, RN Phone Number: 05/20/2024, 3:41 PM  Clinical Narrative:     Hedda accepted for [facility] services. Information on the AVS.  CM contacted Mitch with [employer] to have transport tank of oxygen  delivered to the room for dc tomorrow.   Expected Discharge Plan: Skilled Nursing Facility Barriers to Discharge: Insurance Authorization               Expected Discharge Plan and Services In-house Referral: Clinical Social Work   Post Acute Care Choice: Skilled Nursing Facility Living arrangements for the past 2 months: Single Family Home                                       Social Drivers of Health (SDOH) Interventions SDOH Screenings   Food Insecurity: No Food Insecurity (05/14/2024)  Housing: Low Risk (05/14/2024)  Transportation Needs: No Transportation Needs (05/14/2024)  Utilities: Not At Risk (05/14/2024)  Depression (PHQ2-9): Low Risk (06/21/2022)  Social Connections: Socially Integrated (05/14/2024)  Tobacco Use: Medium Risk (05/14/2024)    Readmission Risk Interventions     No data to display

## 2024-05-20 NOTE — TOC Progression Note (Signed)
 Transition of Care [facility]) - Progression Note    Patient Details  Name: Jeremiah [name] MRN: [id number] Date of Birth: 09/11/1940  Transition of Care [facility]) CM/SW Contact  Landry DELENA Senters, RN Phone Number: 05/20/2024, 12:49 PM  Clinical Narrative:    CM spoke with patient's daughter about HH. They have had [facility] in the past. [facility] referral sent in hub. Daughter is not able to bring O2 tank for transport home. CM to contact [employer] to bring tank to room for transport home.     CM will continue to follow.    Expected Discharge Plan: Skilled Nursing Facility Barriers to Discharge: Insurance Authorization               Expected Discharge Plan and Services In-house Referral: Clinical Social Work   Post Acute Care Choice: Skilled Nursing Facility Living arrangements for the past 2 months: Single Family Home                                       Social Drivers of Health (SDOH) Interventions SDOH Screenings   Food Insecurity: No Food Insecurity (05/14/2024)  Housing: Low Risk (05/14/2024)  Transportation Needs: No Transportation Needs (05/14/2024)  Utilities: Not At Risk (05/14/2024)  Depression (PHQ2-9): Low Risk (06/21/2022)  Social Connections: Socially Integrated (05/14/2024)  Tobacco Use: Medium Risk (05/14/2024)    Readmission Risk Interventions     No data to display

## 2024-05-20 NOTE — TOC Progression Note (Addendum)
 Transition of Care [facility]) - Progression Note    Patient Details  Name: Jeremiah [name] MRN: [id number] Date of Birth: [birth year]  [facility]) CM/SW Contact  Inocente GORMAN Kindle, LCSW Phone Number: 05/20/2024, 8:49 AM  Clinical Narrative:    8:49 AM-Insurance approval still pending for [facility].   9:37 AM-Peer to Peer requested by insurance for SNF: [phone], option 5 (member ID: [id number]), deadline 2pm today. Updated MD.   Beatris with patient's son and made him aware of likely insurance denial for SNF but will update him when official decision is offered. He is moving his mother into a memory care facility today and was hopeful that rehab would give him time to get an open bed at that facility for his father as well.    Expected Discharge Plan: Skilled Nursing Facility Barriers to Discharge: Insurance Authorization               Expected Discharge Plan and Services In-house Referral: Clinical Social Work   Post Acute Care Choice: Skilled Nursing Facility Living arrangements for the past 2 months: Single Family Home                                       Social Drivers of Health (SDOH) Interventions SDOH Screenings   Food Insecurity: No Food Insecurity (05/14/2024)  Housing: Low Risk (05/14/2024)  Transportation Needs: No Transportation Needs (05/14/2024)  Utilities: Not At Risk (05/14/2024)  Depression (PHQ2-9): Low Risk (06/21/2022)  Social Connections: Socially Integrated (05/14/2024)  Tobacco Use: Medium Risk (05/14/2024)    Readmission Risk Interventions     No data to display

## 2024-05-20 NOTE — Progress Notes (Signed)
 O2 decreased to 3L/[place] per Dr. Dennise. Sats 89-90%. O2 increased back to 4L/[place] to maintain sats >92% per order. No acute distress noted.

## 2024-05-20 NOTE — Progress Notes (Signed)
 Took O2 off to ambulate to bathroom. Upon entering room, RN noted pt lips cyanotic. O2 sats 70's. O2 replaced at 4L/[place], HOB elevated, deep breathing encouraged. Sats 92-93%.

## 2024-05-20 NOTE — Progress Notes (Signed)
 " PROGRESS NOTE Jeremiah [name]  FMW:[id number] DOB: Dec 06, 1940 DOA: 05/13/2024 PCP: Shayne Anes, MD   Brief Narrative:   Jeremiah [name] is a 83 y.o. 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  They presented with worsening dyspnea on exertion and fall. Patient has had issues with nonadherence to his Lasix  in past and CPAP/Bipap long term. Has had several very similar admissions in the past year.   ED Course: Vital signs in the ED notable for blood pressure in the 120s-140 systolic, respiratory rate in the 20s.  Requiring 4 to 5 L to maintain saturations. Lab workup included CMP with bicarb 36, BUN 56, creatinine elevated to 2.46 and baseline 1.3, glucose 150, protein 6.3.  CBC with hemoglobin 12.1 which is down from previous baseline at the beginning of the year of 14-15.  Platelets 148.  proBNP mildly elevated at 465.  Respiratory panel for flu COVID and RSV negative.  Chest x-ray showed chronic low lung volumes with right hemidiaphragm elevation and small right pleural effusion but no acute abnormality. CT chest showed small right pleural effusion similar ventriculopleural catheter. CT head showed no acute normality and chronic shunt in place. CT C-spine showed no acute abnormality and CT evidence of epidural venous engorgement possibly suggesting over shunting.  Assessment & Plan:  Acute on chronic respiratory failure with hypoxia and hypercarbia - uses 3 L nasal cannula oxygen  at home chronically, was on 10L HFNC. This likely was hypoventilation due to severe respiratory alkalosis, he was likely compensating for the extremely high bicarb due to overdiuresis and contraction, he follows with [facility] pulmonology outpatient. Last seen 2/12. Denies SOB this am. He has normal WOB and is  alert.  Chest CT and chest x-ray nonacute, stable pleural effusion with history of chronic right ventriculopleural shunt catheter, with supportive care I-S and flutter valve along with nebulizer treatments now improving.  Still has atelectasis staff requested to place him in the chair with I-S and flutter valve, currently asymptomatic and has no complaints in terms of shortness of breath.  Continue to monitor postdischarge follow-up with pulmonary.  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 No evidence of volume overload on exam nor CT chest.  Continue Coreg , still appears dehydrated more IV fluids on 05/18/2024, continue to hold diuretics.   Metabolic encephalopathy - Head CT negative, likely from hypercarbia/hypoxia/dehydration/AKI. Appears to be close to baseline again per son description.   Fall/right hand swelling: Xray showing Oblique fracture of the fourth digit proximal phalanx - brace, buddy tape and ortho follow up, analgesia PRN  Normal pressure hydrocephalus  - Stable ventriculopleural shunt on CT head and CT chest. Continue outpatient follow-up  Hypertension - continue Coreg    Hyperlipidemia  - Continue home Zetia    History of prostate cancer - History of AAA  - Noted  DM 2 > 20 units long-acting insulin  daily at home held currently with low-normal blood sugars. Hgb A1c is 7.7. Moderate sliding scale    CBG (last 3)  Recent Labs    05/19/24 1637 05/19/24 2048 05/20/24 0804  GLUCAP 104*  183* 156*     DVT prophylaxis:   Lovenox  Code Status: -  Full Family Communication:  daughter Corean on phone [phone] on 05/17/2024, 05/19/2024 message left at 8:17 AM. Disposition Plan:  To be decided Consults called:    None Admission status:    Inpatient  Subjective:  Patient in bed, appears comfortable, denies any headache, no fever, no chest pain or pressure, no shortness of breath , no abdominal pain. No new focal weakness.   Objective:  Vitals:   05/20/24 0614 05/20/24 0801 05/20/24 0854 05/20/24 0916  BP:      Pulse: 79     Resp:      Temp:  98.2 F (36.8 C)    TempSrc:  Oral    SpO2: 90%  92% 92%  Weight:      Height:       No intake or output data in the 24 hours ending 05/20/24 0942  Filed Weights   05/15/24 0500 05/16/24 0500 05/18/24 0457  Weight: 88.8 kg 86.7 kg 87.5 kg   Examination:  Awake mildly confused, No new F.N deficits, Normal affect [place].AT,PERRAL Supple Neck, No JVD,   Symmetrical Chest wall movement, Good air movement bilaterally, CTAB RRR,No Gallops, Rubs or new Murmurs,  +ve B.Sounds, abdomen nontender No Cyanosis, Clubbing or edema     Data Review:   Patient Lines/Drains/Airways Status     Active Line/Drains/Airways     Name Placement date Placement time Site Days   Peripheral IV 05/16/24 20 G 1 Left;Posterior;Lateral Forearm 05/16/24  1058  Forearm  4             Inpatient Medications  Scheduled Meds:  aspirin  EC  81 mg Oral Daily   budesonide  (PULMICORT ) nebulizer solution  0.5 mg Nebulization BID   carvedilol   12.5 mg Oral BID   enoxaparin  (LOVENOX ) injection  40 mg Subcutaneous QHS   ezetimibe   10 mg Oral Daily   feeding supplement  237 mL Oral BID BM   insulin  aspart  0-15 Units Subcutaneous TID WC   pantoprazole   20 mg Oral QAC breakfast   sodium chloride  flush  3 mL Intravenous Q12H   tamsulosin   0.4 mg Oral Daily   Continuous Infusions:   PRN Meds:.acetaminophen  **OR** acetaminophen , ipratropium-albuterol , polyethylene glycol  DVT Prophylaxis  enoxaparin  (LOVENOX ) injection 40 mg Start: 05/17/24 2200   Recent Labs  Lab 05/13/24 1254 05/13/24 1924 05/16/24 0416 05/17/24 0436 05/18/24 0637 05/19/24 0458 05/20/24 0415  WBC 8.4   < > 6.7 6.6  6.0 5.9 5.8  HGB 12.1*   < > 11.9* 11.8* 11.7* 12.1* 11.9*  HCT 41.4   < > 40.5 40.0 38.6* 40.6 38.7*  PLT 148*   < > 170 155 146* 164 171  MCV 107.0*   < > 104.4* 104.7* 101.6* 102.3* 100.5*  MCH 31.3   < > 30.7 30.9 30.8 30.5 30.9  MCHC 29.2*   < > 29.4* 29.5* 30.3 29.8* 30.7  RDW 14.2   < > 13.7 13.5 13.6 13.5 13.5  LYMPHSABS 0.7  --   --  0.7 0.7 0.7 0.6*  MONOABS 0.7  --   --  0.6 0.5 0.4 0.4  EOSABS 0.1  --   --  0.2 0.2 0.2 0.2  BASOSABS 0.0  --   --  0.0 0.0 0.0 0.0   < > = values in this interval not displayed.    Recent Labs  Lab 05/14/24 9471 05/15/24 0401 05/16/24 1608 05/17/24 0436 05/18/24 9362  05/19/24 0458 05/20/24 0415  NA 149*   < > 147* 147* 145 142 143  K 4.6   < > 4.1 4.2 4.3 4.3 4.1  CL 105   < > 103 102 101 100 99  CO2 37*   < > 39* 38* 37* 35* 36*  ANIONGAP 7   < > 5 7 7 7 7   GLUCOSE 110*   < > 230* 137* 141* 134* 137*  BUN 49*   < > 35* 34* 34* 32* 32*  CREATININE 1.99*   < > 1.53* 1.46* 1.60* 1.52* 1.46*  AST 20  --   --  18 16 17 20   ALT 16  --   --  9 9 10 12   ALKPHOS 68  --   --  68 66 64 62  BILITOT 0.4  --   --  0.4 0.4 0.4 0.3  ALBUMIN  3.3*  --   --  3.2* 3.3* 3.4* 3.3*  INR  --   --   --  1.1  --   --   --   TSH  --   --  1.040  --   --   --   --   AMMONIA  --   --   --  31 33 35 38*  MG  --   --   --  1.9 1.8 1.7 1.7  PHOS  --   --   --  2.5 3.2 3.0 3.5  CALCIUM  8.8*   < > 9.1 8.7* 8.8* 9.0 8.9   < > = values in this interval not displayed.      Recent Labs  Lab 05/16/24 1608 05/17/24 0436 05/18/24 0637 05/19/24 0458 05/20/24 0415  INR  --  1.1  --   --   --   TSH 1.040  --   --   --   --   AMMONIA  --  31 33 35 38*  MG  --  1.9 1.8 1.7 1.7  CALCIUM  9.1 8.7* 8.8* 9.0 8.9    --------------------------------------------------------------------------------------------------------------- Lab Results  Component Value Date   CHOL 140 03/14/2022   HDL 44 03/14/2022   LDLCALC 66 03/14/2022   TRIG 181 (H) 03/14/2022    CHOLHDL 3.2 03/14/2022    Lab Results  Component Value Date   HGBA1C 7.7 (H) 04/28/2023   No results for input(s): TSH, T4TOTAL, FREET4, T3FREE, THYROIDAB in the last 72 hours.  Radiology Reports  DG Chest Port 1 View Result Date: 05/19/2024 EXAM: 1 VIEW(S) XRAY OF THE CHEST 05/19/2024 08:37:00 AM COMPARISON: 05/17/2024 CLINICAL HISTORY: SOB (shortness of breath) FINDINGS: LINES, TUBES AND DEVICES: Shunt tubing in right neck, chest, and abdomen. LUNGS AND PLEURA: Persistent low lung volumes. Mild pulmonary edema. Unchanged bibasilar opacities. Unchanged small bilateral pleural effusions. No pneumothorax. HEART AND MEDIASTINUM: Cardiomegaly, unchanged. BONES AND SOFT TISSUES: No acute osseous abnormality. IMPRESSION: 1. Mild pulmonary edema with small bilateral pleural effusions and bibasilar opacities, unchanged. 2. Cardiomegaly, unchanged. Electronically signed by: Michaeline Blanch MD 05/19/2024 01:36 PM EST RP Workstation: [id number]      Signature  -   Lavada Stank M.D on 05/20/2024 at 9:42 AM   -  To page go to [email or web address]   "

## 2024-05-21 ENCOUNTER — Other Ambulatory Visit (HOSPITAL_COMMUNITY): Payer: Self-pay

## 2024-05-21 DIAGNOSIS — W19XXXA Unspecified fall, initial encounter: Secondary | ICD-10-CM | POA: Diagnosis not present

## 2024-05-21 DIAGNOSIS — J9611 Chronic respiratory failure with hypoxia: Secondary | ICD-10-CM | POA: Diagnosis not present

## 2024-05-21 LAB — CBC WITH DIFFERENTIAL/PLATELET
Abs Immature Granulocytes: 0.08 K/uL — ABNORMAL HIGH (ref 0.00–0.07)
Basophils Absolute: 0 K/uL (ref 0.0–0.1)
Basophils Relative: 0 %
Eosinophils Absolute: 0.2 K/uL (ref 0.0–0.5)
Eosinophils Relative: 4 %
HCT: 39.8 % (ref 39.0–52.0)
Hemoglobin: 12 g/dL — ABNORMAL LOW (ref 13.0–17.0)
Immature Granulocytes: 2 %
Lymphocytes Relative: 12 %
Lymphs Abs: 0.7 K/uL (ref 0.7–4.0)
MCH: 31.3 pg (ref 26.0–34.0)
MCHC: 30.2 g/dL (ref 30.0–36.0)
MCV: 103.6 fL — ABNORMAL HIGH (ref 80.0–100.0)
Monocytes Absolute: 0.4 K/uL (ref 0.1–1.0)
Monocytes Relative: 8 %
Neutro Abs: 4.1 K/uL (ref 1.7–7.7)
Neutrophils Relative %: 74 %
Platelets: 166 K/uL (ref 150–400)
RBC: 3.84 MIL/uL — ABNORMAL LOW (ref 4.22–5.81)
RDW: 13.6 % (ref 11.5–15.5)
WBC: 5.5 K/uL (ref 4.0–10.5)
nRBC: 0 % (ref 0.0–0.2)

## 2024-05-21 LAB — COMPREHENSIVE METABOLIC PANEL WITH GFR
ALT: 15 U/L (ref 0–44)
AST: 22 U/L (ref 15–41)
Albumin: 3.4 g/dL — ABNORMAL LOW (ref 3.5–5.0)
Alkaline Phosphatase: 63 U/L (ref 38–126)
Anion gap: 6 (ref 5–15)
BUN: 34 mg/dL — ABNORMAL HIGH (ref 8–23)
CO2: 39 mmol/L — ABNORMAL HIGH (ref 22–32)
Calcium: 9 mg/dL (ref 8.9–10.3)
Chloride: 101 mmol/L (ref 98–111)
Creatinine, Ser: 1.52 mg/dL — ABNORMAL HIGH (ref 0.61–1.24)
GFR, Estimated: 45 mL/min — ABNORMAL LOW
Glucose, Bld: 137 mg/dL — ABNORMAL HIGH (ref 70–99)
Potassium: 4.4 mmol/L (ref 3.5–5.1)
Sodium: 145 mmol/L (ref 135–145)
Total Bilirubin: 0.3 mg/dL (ref 0.0–1.2)
Total Protein: 5.7 g/dL — ABNORMAL LOW (ref 6.5–8.1)

## 2024-05-21 LAB — GLUCOSE, CAPILLARY
Glucose-Capillary: 138 mg/dL — ABNORMAL HIGH (ref 70–99)
Glucose-Capillary: 237 mg/dL — ABNORMAL HIGH (ref 70–99)

## 2024-05-21 LAB — AMMONIA: Ammonia: 42 umol/L — ABNORMAL HIGH (ref 9–35)

## 2024-05-21 MED ORDER — FUROSEMIDE 20 MG PO TABS: 20.0000 mg | ORAL_TABLET | Freq: Every day | ORAL | Status: AC | PRN

## 2024-05-21 MED ORDER — LACTULOSE 10 GM/15ML PO SOLN
20.0000 g | Freq: Two times a day (BID) | ORAL | Status: DC
  Administered 2024-05-21: 20 g via ORAL
  Filled 2024-05-21: qty 30

## 2024-05-21 MED ORDER — TAMSULOSIN HCL 0.4 MG PO CAPS
0.4000 mg | ORAL_CAPSULE | Freq: Every day | ORAL | 0 refills | Status: AC
  Filled 2024-05-21: qty 30, 30d supply, fill #0

## 2024-05-21 MED ORDER — LACTULOSE 10 GM/15ML PO SOLN
20.0000 g | Freq: Every day | ORAL | 0 refills | Status: AC | PRN
  Filled 2024-05-21: qty 237, 7d supply, fill #0

## 2024-05-21 NOTE — TOC Transition Note (Signed)
 Transition of Care [facility]) - Discharge Note   Patient Details  Name: Jeremiah [name] MRN: [id number] Date of Birth: 05-05-1941  [facility] CM/SW Contact:  Marval Gell, RN Phone Number: 05/21/2024, 11:10 AM   Clinical Narrative:      Notified [facility] liaison that patient will DC today.  [facility] notified yesterday to bring tank to room for DC. No additional DME needs identified.   Final next level of care: Home w Home Health Services Barriers to Discharge: No Barriers Identified   Patient Goals and CMS Choice Patient states their goals for this hospitalization and ongoing recovery are:: home CMS Medicare.gov Compare Post Acute Care list provided to:: Patient Choice offered to / list presented to : Patient, Adult Children [place] ownership interest in [facility].provided to:: Patient    Discharge Placement                       Discharge Plan and Services Additional resources added to the After Visit Summary for   In-house Referral: Clinical Social Work   Post Acute Care Choice: Skilled Nursing Facility          DME Arranged: N/A         HH Arranged: RN, PT HH Agency: [facility] Date [facility] Agency Contacted: 05/21/24 Time HH Agency Contacted: [phone] Representative spoke with at [facility] Agency: Darleene  Social Drivers of Health (SDOH) Interventions SDOH Screenings   Food Insecurity: No Food Insecurity (05/14/2024)  Housing: Low Risk (05/14/2024)  Transportation Needs: No Transportation Needs (05/14/2024)  Utilities: Not At Risk (05/14/2024)  Depression (PHQ2-9): Low Risk (06/21/2022)  Social Connections: Socially Integrated (05/14/2024)  Tobacco Use: Medium Risk (05/14/2024)     Readmission Risk Interventions     No data to display

## 2024-05-21 NOTE — Discharge Instructions (Addendum)
 For your right fourth digit fracture keep it braced with tape as in the hospital, do not lift anything heavier than 2 pounds, have your PCP refer you to orthopedics outpatient.    Follow with Primary MD Shayne Anes, MD in 7 days   Get CBC, CMP, Magnesium , 2 view Chest X ray -  checked next visit with your primary MD    Activity: As tolerated with Full fall precautions use walker/cane & assistance as needed  Disposition Home    Diet: Heart Healthy-low carbohydrate.  Check your Weight same time everyday, if you gain over 2 pounds, or you develop in leg swelling, experience more shortness of breath or chest pain, call your Primary MD immediately. Follow Cardiac Low Salt Diet and 1.5 lit/day fluid restriction.    Check CBGs q. ACHS.  Special Instructions: If you have smoked or chewed Tobacco  in the last 2 yrs please stop smoking, stop any regular Alcohol  and or any Recreational drug use.  On your next visit with your primary care physician please Get Medicines reviewed and adjusted.  Please request your Prim.MD to go over all Hospital Tests and Procedure/Radiological results at the follow up, please get all Hospital records sent to your Prim MD by signing hospital release before you go home.  If you experience worsening of your admission symptoms, develop shortness of breath, life threatening emergency, suicidal or homicidal thoughts you must seek medical attention immediately by calling 911 or calling your MD immediately  if symptoms less severe.  You Must read complete instructions/literature along with all the possible adverse reactions/side effects for all the Medicines you take and that have been prescribed to you. Take any new Medicines after you have completely understood and accpet all the possible adverse reactions/side effects.   Do not drive when taking Pain medications.  Do not take more than prescribed Pain, Sleep and Anxiety Medications  Wear Seat belts while driving.

## 2024-05-21 NOTE — Plan of Care (Signed)
   Problem: Education: Goal: Knowledge of General Education information will improve Description: Including pain rating scale, medication(s)/side effects and non-pharmacologic comfort measures Outcome: Progressing   Problem: Clinical Measurements: Goal: Diagnostic test results will improve Outcome: Progressing   Problem: Activity: Goal: Risk for activity intolerance will decrease Outcome: Progressing

## 2024-05-21 NOTE — Discharge Summary (Signed)
 "                                                                                                                                                                               Discharge summary note.  Jeremiah [name] FMW:[id number] DOB: [birth year] DOA: 05/13/2024  PCP: Shayne Anes, MD  Admit date: 05/13/2024  Discharge date: 05/21/2024  Admitted From: Home   Disposition:  Home   Recommendations for Outpatient Follow-up:   Follow up with PCP in 1-2 weeks  PCP Please obtain BMP/CBC, 2 view CXR in 1week,  (see Discharge instructions)   PCP Please follow up on the following pending results: Monitor BMP closely.  Needs outpatient Ortho follow-up for right fourth distal phalanx fracture.   Home Health: PT, RN if qualifies Equipment/Devices: As below Consultations: None  Discharge Condition: Stable    CODE STATUS: Full    Diet Recommendation: Heart Healthy low carbohydrate with strict 1.5 L fluid restriction per day    Chief Complaint  Patient presents with   Shortness of Breath   Fall   Altered Mental Status     Brief history of present illness from the day of admission and additional interim summary    83 y.o. 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  They presented with worsening dyspnea on exertion and fall. Patient has had issues with nonadherence to his Lasix  in past and CPAP/Bipap long term. Has had several very similar admissions in the past year.    ED Course: Vital signs in the ED notable for blood pressure in the 120s-140 systolic, respiratory rate in the 20s.  Requiring 4 to 5 L to maintain saturations. Lab workup included CMP with bicarb 36, BUN 56, creatinine elevated to 2.46 and baseline 1.3, glucose 150, protein 6.3.  CBC with hemoglobin 12.1 which  is down from previous baseline at the beginning of the year of 14-15.  Platelets 148.  proBNP mildly elevated at 465.  Respiratory panel for flu COVID and RSV negative.                                                                   Hospital Course   Acute on chronic respiratory failure with hypoxia and hypercarbia - uses 3 L nasal cannula oxygen  at home chronically, was on  10L HFNC. This likely was hypoventilation due to severe respiratory alkalosis, he was likely compensating for the extremely high bicarb due to overdiuresis and contraction, he follows with [facility] pulmonology outpatient. Last seen 2/12. Denies SOB this am. He has normal WOB and is alert.  Chest CT and chest x-ray nonacute, stable pleural effusion with history of chronic right ventriculopleural shunt catheter, with supportive care I-S and flutter valve along with nebulizer treatments now improving.  Still has atelectasis staff requested to place him in the chair with I-S and flutter valve, currently asymptomatic and has no complaints in terms of shortness of breath.  Continue to monitor postdischarge follow-up with pulmonary.   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   Chronic diastolic CHF - Last echo showed EF 60 MC 5%, G1 DD, mildly reduced RV function. No evidence of volume overload on exam nor CT chest.  Continue Coreg , still appears dehydrated more IV fluids on 05/18/2024, now compensated Home diuretic changed to as needed only, PCP to monitor fluid status and diuretic dose along with BMP closely.   Metabolic encephalopathy - Head CT negative, likely from hypercarbia/hypoxia/dehydration/AKI.  Now with at his baseline.   Fall/right hand swelling: Xray showing Oblique fracture of the fourth digit proximal phalanx - brace, buddy tape and ortho follow  up, analgesia PRN   Normal pressure hydrocephalus  - Stable ventriculopleural shunt on CT head and CT chest. Continue outpatient follow-up   Hypertension - continue Coreg    Hyperlipidemia  - Continue home Zetia    History of prostate cancer - History of AAA  - Noted   DM 2 > continue home regimen and follow-up with PCP.  Discharge diagnosis     Active Problems:   HLD (hyperlipidemia)   HTN (hypertension)   Presence of ventriculopleural shunt   DMII (diabetes mellitus, type 2) (HCC)   Chronic respiratory failure with hypoxia, on home O2 therapy (HCC) - 2 L/min prn per patient   NPH (normal pressure hydrocephalus) (HCC)   Malignant neoplasm of prostate s/p radiation 2015   AAA (abdominal aortic aneurysm)   OSA on CPAP   Acute on chronic respiratory failure with hypoxia (HCC)   Chronic diastolic CHF (congestive heart failure) (HCC)   AKI (acute kidney injury)    Discharge instructions    Discharge Instructions     Discharge instructions   Complete by: As directed    For your right fourth digit fracture keep it braced with tape as in the hospital, do not lift anything heavier than 2 pounds, have your PCP refer you to orthopedics outpatient.    Follow with Primary MD Shayne Anes, MD in 7 days   Get CBC, CMP, Magnesium , 2 view Chest X ray -  checked next visit with your primary MD    Activity: As tolerated with Full fall precautions use walker/cane & assistance as needed  Disposition Home    Diet: Diet: Heart Healthy-low carbohydrate.  Check your Weight same time everyday, if you gain over 2 pounds, or you develop in leg swelling, experience more shortness of breath or chest pain, call your Primary MD immediately. Follow Cardiac Low Salt Diet and 1.5 lit/day fluid restriction.    Check CBGs q. ACHS.  Special Instructions: If you have smoked or chewed Tobacco  in the last  2 yrs please stop smoking, stop any regular Alcohol  and or any Recreational drug use.  On your next  visit with your primary care physician please Get Medicines reviewed and adjusted.  Please request your Prim.MD to go over all Hospital Tests and Procedure/Radiological results at the follow up, please get all Hospital records sent to your Prim MD by signing hospital release before you go home.  If you experience worsening of your admission symptoms, develop shortness of breath, life threatening emergency, suicidal or homicidal thoughts you must seek medical attention immediately by calling 911 or calling your MD immediately  if symptoms less severe.  You Must read complete instructions/literature along with all the possible adverse reactions/side effects for all the Medicines you take and that have been prescribed to you. Take any new Medicines after you have completely understood and accpet all the possible adverse reactions/side effects.   Do not drive when taking Pain medications.  Do not take more than prescribed Pain, Sleep and Anxiety Medications  Wear Seat belts while driving.   Increase activity slowly   Complete by: As directed        Discharge Medications   Allergies as of 05/21/2024       Reactions   Lipitor [atorvastatin] Other (See Comments)   Myalgias    Maxipime  Dobby.dimmer ] Other (See Comments)   Toxic encephalopathy        Medication List     STOP taking these medications    metFORMIN 500 MG 24 hr tablet Commonly known as: GLUCOPHAGE-XR       TAKE these medications    acetaminophen  500 MG tablet Commonly known as: TYLENOL  Take 500 mg by mouth 2 (two) times daily as needed for headache or fever (pain).   aspirin  EC 81 MG tablet Take 81 mg by mouth daily.   carvedilol  12.5 MG tablet Commonly known as: COREG  Take 1 tablet (12.5 mg total) by mouth 2 (two) times daily.   cetirizine 10 MG tablet Commonly known as: ZYRTEC Take 10 mg by mouth daily.   cholecalciferol  25 MCG (1000 UNIT) tablet Commonly known as: VITAMIN D3 Take 2,000 Units by mouth  daily.   cyanocobalamin  1000 MCG tablet Commonly known as: VITAMIN B12 Take 1 tablet (1,000 mcg total) by mouth daily.   ezetimibe  10 MG tablet Commonly known as: ZETIA  Take 10 mg by mouth daily.   FISH OIL PLUS CO Q-10 PO Take 1 capsule by mouth daily.   furosemide  20 MG tablet Commonly known as: Lasix  Take 1 tablet (20 mg total) by mouth daily as needed for edema or fluid (only as needed). Please take extra tablet in the afternoon if developing lower extremity edema or> 3 pounds fluid retention/weight gain overnight What changed:  when to take this reasons to take this   HumaLOG KwikPen 100 UNIT/ML KwikPen Generic drug: insulin  lispro Inject 5 Units into the skin daily with lunch.   ipratropium-albuterol  0.5-2.5 (3) MG/3ML Soln Commonly known as: DUONEB Take 3 mLs by nebulization 2 (two) times daily.   lactulose 10 GM/15ML solution Commonly known as: CHRONULAC Take 30 mLs (20 g total) by mouth daily as needed for mild constipation.   Lantus  SoloStar 100 UNIT/ML Solostar Pen Generic drug: insulin  glargine Inject 20 Units into the skin daily with lunch.   pantoprazole  20 MG tablet Commonly known as: PROTONIX  Take 20 mg by mouth daily.   tamsulosin  0.4 MG Caps capsule Commonly known as: FLOMAX  Take 1 capsule (0.4 mg total) by mouth daily.  Start taking on: May 22, 2024         Contact information for follow-up providers     Shayne Anes, MD. Schedule an appointment as soon as possible for a visit in 1 week(s).   Specialty: Internal Medicine Contact information: [address] [place] [address] [phone]         Jordan, [name], MD. Schedule an appointment as soon as possible for a visit in 1 week(s).   Specialty: Cardiology Contact information: [address] [place] [address] [phone]              Contact information for after-discharge care     Home Medical Care     [facility] .    Service: Home Health Services Contact information: [address] [place]  [address] [phone]                     Major procedures and Radiology Reports - PLEASE review detailed and final reports thoroughly  -       DG Chest Port 1 View Result Date: 05/19/2024 EXAM: 1 VIEW(S) XRAY OF THE CHEST 05/19/2024 08:37:00 AM COMPARISON: 05/17/2024 CLINICAL HISTORY: SOB (shortness of breath) FINDINGS: LINES, TUBES AND DEVICES: Shunt tubing in right neck, chest, and abdomen. LUNGS AND PLEURA: Persistent low lung volumes. Mild pulmonary edema. Unchanged bibasilar opacities. Unchanged small bilateral pleural effusions. No pneumothorax. HEART AND MEDIASTINUM: Cardiomegaly, unchanged. BONES AND SOFT TISSUES: No acute osseous abnormality. IMPRESSION: 1. Mild pulmonary edema with small bilateral pleural effusions and bibasilar opacities, unchanged. 2. Cardiomegaly, unchanged. Electronically signed by: Michaeline Blanch MD 05/19/2024 01:36 PM EST RP Workstation: [id number]   DG Chest Port 1 View Result Date: 05/17/2024 EXAM: 1 VIEW(S) XRAY OF THE CHEST 05/17/2024 07:21:00 AM COMPARISON: Portable chest 05/13/2024. CLINICAL HISTORY: SOB (shortness of breath) FINDINGS: LINES, TUBES AND DEVICES: Right sided VP shunt tubing courses over the chest. LUNGS AND PLEURA: There is increased central vascular congestion, developing mild central interstitial edema. There are pleural effusions with patchy opacities in the base of the lungs which are increasing, concerning for pneumonia or aspiration, but in the setting of low pulmonary inspiration. There is a chronically elevated right hemidiaphragm partially obscuring the right base. No pneumothorax. HEART AND MEDIASTINUM: Stable cardiomegaly. Aortic calcification and tortuosity again noted with stable mediastinum. BONES AND SOFT TISSUES: Thoracic spondylosis. No acute osseous abnormality. IMPRESSION: 1. Increased central vascular congestion and  developing mild central interstitial edema. 2. Pleural effusions with increasing patchy basilar opacities, concerning for pneumonia or aspiration in the setting of low pulmonary inspiration. 3. Cardiomegaly. Electronically signed by: Francis Quam MD 05/17/2024 08:04 AM EST RP Workstation: [id number]   DG Hand 2 View Right Result Date: 05/14/2024 CLINICAL DATA:  Right hand pain. EXAM: RIGHT HAND - 2 VIEW COMPARISON:  None Available. FINDINGS: Oblique mildly displaced fracture of the fourth digit proximal phalanx. No convincing intra-articular involvement. No additional fracture. Multifocal osteoarthritis. No erosive change. Mild soft tissue edema. IMPRESSION: Oblique fracture of the fourth digit proximal phalanx. Electronically Signed   By: Andrea Gasman M.D.   On: 05/14/2024 18:26   CT CHEST WO CONTRAST Result Date: 05/13/2024 EXAM: CT CHEST WITHOUT CONTRAST 05/13/2024 12:41:59 PM TECHNIQUE: CT of the chest was performed without the administration of intravenous contrast. Multiplanar reformatted images are provided for review. Automated exposure control, iterative reconstruction, and/or weight based adjustment of the mA/kV was utilized to reduce the radiation dose to as low as reasonably achievable.  COMPARISON: 04/27/2023 CLINICAL HISTORY: FINDINGS: MEDIASTINUM: Mild cardiomegaly. Dense multivessel coronary atherosclerosis. The ascending aorta remains dilated measuring 4.2 cm. Scattered atherosclerosis throughout the aortic arch and descending aorta. The central airways are clear. LYMPH NODES: No mediastinal, hilar or axillary lymphadenopathy. LUNGS AND PLEURA: Ventriculopleural catheter along the right chest terminating in the posteromedial right costophrenic sulcus. No discontinuity or kinking. There is a small right pleural effusion with compressive atelectasis in the right middle and right lower lobes. Subsegmental atelectasis also present in the left lower lobe. No pneumothorax. Chronic elevation of  the right hemidiaphragm. SOFT TISSUES/BONES: Thoracic dish. No acute abnormality of the soft tissues. UPPER ABDOMEN: Multiple small radiopaque gallstones. Limited images of the upper abdomen demonstrates no acute abnormality. IMPRESSION: 1. Small right pleural effusion with compressive atelectasis in the right middle and right lower lobes. No pneumothorax. 2. Similar appearance of the ventriculopleural catheter along the right chest, terminating in the posteromedial right costophrenic sulcus. 3. 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 4. Cholecystolithiasis. Electronically signed by: Rogelia Myers MD 05/13/2024 02:29 PM EST RP Workstation: [id number]   DG Chest Port 1 View Result Date: 05/13/2024 EXAM: 1 VIEW(S) XRAY OF THE CHEST 05/13/2024 12:12:00 PM COMPARISON: 07/11/2023 CLINICAL HISTORY: SOB FINDINGS: LINES, TUBES AND DEVICES: VP shunt tubing traverses the right chest. LUNGS AND PLEURA: Low lung volumes accentuate the cardiomediastinal silhouette and pulmonary vascularity. Elevated right hemidiaphragm, chronic and stable from radiographs and CT last year. Bibasilar chronic atelectasis. Small chronic right pleural effusion suspected. No pneumothorax. HEART AND MEDIASTINUM: Cardiomegaly. Aortic atherosclerosis. BONES AND SOFT TISSUES: No acute osseous abnormality. Paucity of bowel gas in the visible abdomen. IMPRESSION: 1. Chronic low lung volumes with elevation of the right hemidiaphragm and small right pleural effusion. 2. No new cardiopulmonary abnormality. Electronically signed by: Helayne Hurst MD 05/13/2024 01:43 PM EST RP Workstation: [id number]   CT Cervical Spine Wo Contrast Result Date: 05/13/2024 EXAM: CT CERVICAL SPINE WITHOUT CONTRAST 05/13/2024 12:41:59 PM TECHNIQUE: CT of the cervical spine was performed without the administration of intravenous contrast. Multiplanar reformatted images are provided for review. Automated exposure control, iterative reconstruction,  and/or weight based adjustment of the mA/kV was utilized to reduce the radiation dose to as low as reasonably achievable. COMPARISON: CT head today reported separately. CT cervical spine 07/11/2023. CLINICAL HISTORY: 83 year old male. Status post fall yesterday. Polytrauma, blunt. Altered mental status. FINDINGS: BONES AND ALIGNMENT: Stable cervical lordosis. No acute fracture or traumatic malalignment. DEGENERATIVE CHANGES: Chronic cervical spine degeneration, pronounced at the anterior C1-C2 articulation appears stable. SOFT TISSUES: Negative prevertebral soft tissues except for a partially retropharyngeal course of the right carotid (normal variant). Chronic cervical carotid calcified atherosclerosis. Visible right posterior neck shunt tubing extending to the right chest with no adverse features. Trace layering right pleural effusion at the visible lung apex. SPINAL CANAL: Abnormal increased density within the cervical spinal canal below the foramen magnum (series 8 image 37), but favored to be epidural venous engorgement and appears similar to prior cervical spine CT.In the visible upper thoracic spine, there is evidence of chronic asymmetric epidural lipomatosis (series 8, image 102), which also appears stable. IMPRESSION: 1. No acute traumatic injury identified in the cervical spine. 2. CT evidence of upper cervical epidural venous engorgement , further corroborating the possibility of over shunting. 3. Chronic cervical spine degeneration. 4. Small layering pleural effusion in the right lung apex. Electronically signed by: Helayne Hurst MD 05/13/2024 01:15 PM EST RP Workstation: [id number]   CT Head Wo Contrast Result Date:  05/13/2024 EXAM: CT HEAD WITHOUT CONTRAST 05/13/2024 12:41:59 PM TECHNIQUE: CT of the head was performed without the administration of intravenous contrast. Automated exposure control, iterative reconstruction, and/or weight based adjustment of the mA/kV was utilized to reduce the  radiation dose to as low as reasonably achievable. COMPARISON: Brain MRI 02/01/2022, Head CT 07/11/2023. CLINICAL HISTORY: 83 year old male status post fall yesterday. Polytrauma, blunt. FINDINGS: BRAIN AND VENTRICLES: Chronic right posterior approach ventriculostomy catheter appears stable in configuration along with right posterior convexity shunt reservoir and visible tubing. Chronic CSF shunt with ventricle size smaller since 07/11/2023. Bilateral low density extra axial fluid collections are slightly larger, measuring 5 to 7 mm thickness on the left and up to 8 mm on the right (vs 3 to 4 mm on the left previously and generally 7 mm on the right previously). No midline shift. Mild but increased mass effect on both hemispheres (coronal image 36). No acute intracranial hemorrhage. Stable gray white differentiation. No evidence of acute infarct. No hydrocephalus. No suspicious intracranial vascular hyperdensity. Consider over shunting. ORBITS: No acute abnormality. SINUSES: Paranasal sinuses, tympanic cavities and mastoids are well aerated. SOFT TISSUES AND SKULL: No acute soft tissue abnormality. No acute orbital scalp or soft tissue finding. No skull fracture. IMPRESSION: 1. No acute traumatic injury identified. 2. Chronic CSF shunt with smaller ventricles and larger Bilateral extra-axial low-density fluid collections since February. Consider over-shunting. Electronically signed by: Helayne Hurst MD 05/13/2024 01:11 PM EST RP Workstation: [id number]   Today   Subjective    Jeremiah [name] today has no headache,no chest abdominal pain,no new weakness tingling or numbness, feels much better     Objective   Blood pressure 132/84, pulse 79, temperature 98.2 F (36.8 C), temperature source Oral, resp. rate 18, height 5' 10 (1.778 m), weight 87.5 kg, SpO2 97%.   Intake/Output Summary (Last 24 hours) at 05/21/2024 1058 Last data filed at 05/21/2024 1000 Gross per 24 hour  Intake 240 ml  Output 500 ml   Net -260 ml    Exam  Awake Alert, No new F.N deficits,    [place].AT,PERRAL Supple Neck,   Symmetrical Chest wall movement, Good air movement bilaterally, CTAB RRR,No Gallops,   +ve B.Sounds, Abd Soft, Non tender,  No Cyanosis, Clubbing or edema    Data Review   Recent Labs  Lab 05/17/24 0436 05/18/24 0637 05/19/24 0458 05/20/24 0415 05/21/24 0434  WBC 6.6 6.0 5.9 5.8 5.5  HGB 11.8* 11.7* 12.1* 11.9* 12.0*  HCT 40.0 38.6* 40.6 38.7* 39.8  PLT 155 146* 164 171 166  MCV 104.7* 101.6* 102.3* 100.5* 103.6*  MCH 30.9 30.8 30.5 30.9 31.3  MCHC 29.5* 30.3 29.8* 30.7 30.2  RDW 13.5 13.6 13.5 13.5 13.6  LYMPHSABS 0.7 0.7 0.7 0.6* 0.7  MONOABS 0.6 0.5 0.4 0.4 0.4  EOSABS 0.2 0.2 0.2 0.2 0.2  BASOSABS 0.0 0.0 0.0 0.0 0.0    Recent Labs  Lab 05/16/24 1608 05/17/24 0436 05/18/24 0637 05/19/24 0458 05/20/24 0415 05/21/24 0434  NA 147* 147* 145 142 143 145  K 4.1 4.2 4.3 4.3 4.1 4.4  CL 103 102 101 100 99 101  CO2 39* 38* 37* 35* 36* 39*  ANIONGAP 5 7 7 7 7 6   GLUCOSE 230* 137* 141* 134* 137* 137*  BUN 35* 34* 34* 32* 32* 34*  CREATININE 1.53* 1.46* 1.60* 1.52* 1.46* 1.52*  AST  --  18 16 17 20 22   ALT  --  9 9 10 12 15   ALKPHOS  --  68 66 64 62 63  BILITOT  --  0.4 0.4 0.4 0.3 0.3  ALBUMIN   --  3.2* 3.3* 3.4* 3.3* 3.4*  INR  --  1.1  --   --   --   --   TSH 1.040  --   --   --   --   --   AMMONIA  --  31 33 35 38* 42*  MG  --  1.9 1.8 1.7 1.7  --   PHOS  --  2.5 3.2 3.0 3.5  --   CALCIUM  9.1 8.7* 8.8* 9.0 8.9 9.0    Total Time in preparing paper work, data evaluation and todays exam - 35 minutes  Signature  -    Lavada Stank M.D on 05/21/2024 at 10:58 AM   -  To page go to [email or web address]      "

## 2024-05-21 NOTE — Progress Notes (Signed)
 Physical Therapy Treatment Patient Details Name: Jeremiah [name] MRN: [id number] DOB: 1940/08/13 Today's Date: 05/21/2024   History of Present Illness 83 y.o. male presents to [facility] 05/13/24 after fall with worsening dyspnea w/ exertion. Admitted with acute on chronic respiratory failure and metabolic encephalopathy. Imaging showed oblique fx of R 4th digit proximal phalanx. 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 SpO2 remains WFL on 3L throughout session when pleth stable. Pt able to perform gait trial without AD with some unsteadiness, but no LOB. Education on energy conservation and O2 sat monitoring. Pt able to perform serial STS with good BLE strength. Pt continues to benefit from PT services to progress toward functional mobility goals.    If plan is discharge home, recommend the following: A little help with walking and/or transfers;A little help with bathing/dressing/bathroom;Assist for transportation;Help with stairs or ramp for entrance   Can travel by private vehicle     Yes  Equipment Recommendations  None recommended by PT    Recommendations for Other Services       Precautions / Restrictions Precautions Precautions: Fall Recall of Precautions/Restrictions: Impaired Precaution/Restrictions Comments: right hand 4th digit proximal phalanx fx Required Braces or Orthoses: Splint/Cast Splint/Cast: splint on R 4th digit Restrictions Weight Bearing Restrictions Per Provider Order: No Other Position/Activity Restrictions: no order for weight bearing status but try to eliminate weightbearing into hand d/t fracture.     Mobility  Bed Mobility Overal bed mobility: Needs Assistance Bed Mobility: Supine to Sit     Supine to sit: Min assist, Used  rails     General bed mobility comments: cues for sequencing and technique. light min A for trunk elevation from flat bed    Transfers Overall transfer level: Needs assistance Equipment used: None Transfers: Sit to/from Stand Sit to Stand: Contact guard assist           General transfer comment: STS from EOB and recliner without UE support. cues for anterior weight shift.    Ambulation/Gait Ambulation/Gait assistance: Contact guard assist Gait Distance (Feet): 170 Feet Assistive device: None Gait Pattern/deviations: Step-through pattern, Decreased stride length, Trunk flexed Gait velocity: decr     General Gait Details: slowed gait with slight unsteadiness without UE support, but no LOB. CGA for safety. Pt tends to reach out for UE support during obstacle negotiation   Stairs             Wheelchair Mobility     Tilt Bed    Modified Rankin (Stroke Patients Only)       Balance Overall balance assessment: History of Falls, Needs assistance Sitting-balance support: No upper extremity supported, Feet supported Sitting balance-Leahy Scale: Good Sitting balance - Comments: supervision EOB   Standing balance support: During functional activity, Reliant on assistive device for balance, No upper extremity supported Standing balance-Leahy Scale: Fair Standing balance comment: without AD and CGA for safety                            Communication Communication Communication: No apparent difficulties  Cognition Arousal: Alert Behavior During Therapy: WFL for tasks assessed/performed   PT - Cognitive impairments: No family/caregiver present to determine baseline, Problem solving, Safety/Judgement, Memory  PT - Cognition Comments: continues to be unaware of splint being doffed and importance due to finger fx Following commands: Intact      Cueing Cueing Techniques: Verbal cues  Exercises Other Exercises Other  Exercises: x10 serial STS without UE support    General Comments General comments (skin integrity, edema, etc.): SpO2 >90% on 3L during session. unstable pleth during mobility, but WFL during standing rest breaks. Cues for pursed lip breathing      Pertinent Vitals/Pain Pain Assessment Pain Assessment: No/denies pain     PT Goals (current goals can now be found in the care plan section) Acute Rehab PT Goals Patient Stated Goal: to go home PT Goal Formulation: With patient Time For Goal Achievement: 05/30/24 Progress towards PT goals: Progressing toward goals    Frequency    Min 2X/week       AM-PAC PT 6 Clicks Mobility   Outcome Measure  Help needed turning from your back to your side while in a flat bed without using bedrails?: A Little Help needed moving from lying on your back to sitting on the side of a flat bed without using bedrails?: A Little Help needed moving to and from a bed to a chair (including a wheelchair)?: A Little Help needed standing up from a chair using your arms (e.g., wheelchair or bedside chair)?: A Little Help needed to walk in hospital room?: A Little Help needed climbing 3-5 steps with a railing? : A Lot 6 Click Score: 17    End of Session Equipment Utilized During Treatment: Oxygen ;Gait belt Activity Tolerance: Patient tolerated treatment well Patient left: in chair;with call bell/phone within reach;with chair alarm set Nurse Communication: Mobility status PT Visit Diagnosis: Unsteadiness on feet (R26.81);Other abnormalities of gait and mobility (R26.89);Muscle weakness (generalized) (M62.81);History of falling (Z91.81)     Time: 0902-0927 PT Time Calculation (min) (ACUTE ONLY): 25 min  Charges:    $Gait Training: 8-22 mins $Therapeutic Activity: 8-22 mins PT General Charges $$ ACUTE PT VISIT: 1 Visit                    Darryle George, PTA Acute Rehabilitation Services Secure Chat Preferred  [phone]    Darryle George 05/21/2024, 9:39 AM

## 2024-05-21 NOTE — Plan of Care (Signed)
" °  Problem: Coping: Goal: Ability to adjust to condition or change in health will improve Outcome: Progressing   Problem: Health Behavior/Discharge Planning: Goal: Ability to identify and utilize available resources and services will improve Outcome: Progressing   Problem: Tissue Perfusion: Goal: Adequacy of tissue perfusion will improve Outcome: Progressing   Problem: Clinical Measurements: Goal: Will remain free from infection Outcome: Progressing   "

## 2024-05-21 NOTE — Progress Notes (Signed)
 " PROGRESS NOTE Jeremiah [name]  FMW:[id number] DOB: 31-May-1940 DOA: 05/13/2024 PCP: Shayne Anes, MD   Brief Narrative:   Jeremiah [name] is a 83 y.o. 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  They presented with worsening dyspnea on exertion and fall. Patient has had issues with nonadherence to his Lasix  in past and CPAP/Bipap long term. Has had several very similar admissions in the past year.   ED Course: Vital signs in the ED notable for blood pressure in the 120s-140 systolic, respiratory rate in the 20s.  Requiring 4 to 5 L to maintain saturations. Lab workup included CMP with bicarb 36, BUN 56, creatinine elevated to 2.46 and baseline 1.3, glucose 150, protein 6.3.  CBC with hemoglobin 12.1 which is down from previous baseline at the beginning of the year of 14-15.  Platelets 148.  proBNP mildly elevated at 465.  Respiratory panel for flu COVID and RSV negative.  Chest x-ray showed chronic low lung volumes with right hemidiaphragm elevation and small right pleural effusion but no acute abnormality. CT chest showed small right pleural effusion similar ventriculopleural catheter. CT head showed no acute normality and chronic shunt in place. CT C-spine showed no acute abnormality and CT evidence of epidural venous engorgement possibly suggesting over shunting.  Assessment & Plan:  Acute on chronic respiratory failure with hypoxia and hypercarbia - uses 3 L nasal cannula oxygen  at home chronically, was on 10L HFNC. This likely was hypoventilation due to severe respiratory alkalosis, he was likely compensating for the extremely high bicarb due to overdiuresis and contraction, he follows with [facility] pulmonology outpatient. Last seen 2/12. Denies SOB this am. He has normal WOB and is  alert.  Chest CT and chest x-ray nonacute, stable pleural effusion with history of chronic right ventriculopleural shunt catheter, with supportive care I-S and flutter valve along with nebulizer treatments now improving.  Still has atelectasis staff requested to place him in the chair with I-S and flutter valve, currently asymptomatic and has no complaints in terms of shortness of breath.  Continue to monitor postdischarge follow-up with pulmonary.  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 No evidence of volume overload on exam nor CT chest.  Continue Coreg , still appears dehydrated more IV fluids on 05/18/2024, continue to hold diuretics.   Metabolic encephalopathy - Head CT negative, likely from hypercarbia/hypoxia/dehydration/AKI. Appears to be close to baseline again per son description.   Fall/right hand swelling: Xray showing Oblique fracture of the fourth digit proximal phalanx - brace, buddy tape and ortho follow up, analgesia PRN  Normal pressure hydrocephalus  - Stable ventriculopleural shunt on CT head and CT chest. Continue outpatient follow-up  Hypertension - continue Coreg    Hyperlipidemia  - Continue home Zetia    History of prostate cancer - History of AAA  - Noted  DM 2 > 20 units long-acting insulin  daily at home held currently with low-normal blood sugars. Hgb A1c is 7.7. Moderate sliding scale    CBG (last 3)  Recent Labs    05/20/24 1532 05/20/24 2048 05/21/24 0739  GLUCAP 107*  147* 138*     DVT prophylaxis:   Lovenox  Code Status: -  Full Family Communication:  daughter Corean on phone [phone] on 05/17/2024, 05/19/2024 message left at 8:17 AM. Disposition Plan:  To be decided Consults called:    None Admission status:    Inpatient  Subjective:  Patient in bed, appears comfortable, denies any headache, no fever, no chest pain or pressure, no shortness of breath , no abdominal pain. No new focal weakness.   Objective:  Vitals:   05/21/24 0000 05/21/24 0400 05/21/24 0734 05/21/24 0836  BP: 112/63 125/87 132/84   Pulse: 76 89 79 79  Resp: 19 18  18   Temp: 98.6 F (37 C) 97.9 F (36.6 C) 98.2 F (36.8 C)   TempSrc: Axillary Oral Oral   SpO2: (!) 89% 90%  97%  Weight:      Height:        Intake/Output Summary (Last 24 hours) at 05/21/2024 1025 Last data filed at 05/21/2024 0454 Gross per 24 hour  Intake --  Output 500 ml  Net -500 ml    Filed Weights   05/15/24 0500 05/16/24 0500 05/18/24 0457  Weight: 88.8 kg 86.7 kg 87.5 kg   Examination:  Awake mildly confused, No new F.N deficits, Normal affect .AT,PERRAL Supple Neck, No JVD,   Symmetrical Chest wall movement, Good air movement bilaterally, CTAB RRR,No Gallops, Rubs or new Murmurs,  +ve B.Sounds, abdomen nontender No Cyanosis, Clubbing or edema     Data Review:   Patient Lines/Drains/Airways Status     Active Line/Drains/Airways     None             Inpatient Medications  Scheduled Meds:  aspirin  EC  81 mg Oral Daily   budesonide  (PULMICORT ) nebulizer solution  0.5 mg Nebulization BID   carvedilol   12.5 mg Oral BID   enoxaparin  (LOVENOX ) injection  40 mg Subcutaneous QHS   ezetimibe   10 mg Oral Daily   feeding supplement  237 mL Oral BID BM   insulin  aspart  0-15 Units Subcutaneous TID WC   lactulose  20 g Oral BID   pantoprazole   20 mg Oral QAC breakfast   sodium chloride  flush  3 mL Intravenous Q12H   tamsulosin   0.4 mg Oral Daily   Continuous Infusions:   PRN Meds:.acetaminophen  **OR** acetaminophen , ipratropium-albuterol , polyethylene glycol  DVT Prophylaxis  enoxaparin  (LOVENOX ) injection 40 mg Start: 05/17/24 2200   Recent Labs  Lab 05/17/24 0436 05/18/24 0637 05/19/24 0458 05/20/24 0415  05/21/24 0434  WBC 6.6 6.0 5.9 5.8 5.5  HGB 11.8* 11.7* 12.1* 11.9* 12.0*  HCT 40.0 38.6* 40.6 38.7* 39.8  PLT 155 146* 164 171 166  MCV 104.7* 101.6* 102.3* 100.5* 103.6*  MCH 30.9 30.8 30.5 30.9 31.3  MCHC 29.5* 30.3 29.8* 30.7 30.2  RDW 13.5 13.6 13.5 13.5 13.6  LYMPHSABS 0.7 0.7 0.7 0.6* 0.7  MONOABS 0.6 0.5 0.4 0.4 0.4  EOSABS 0.2 0.2 0.2 0.2 0.2  BASOSABS 0.0 0.0 0.0 0.0 0.0    Recent Labs  Lab 05/16/24 1608 05/17/24 0436 05/18/24 0637 05/19/24 0458 05/20/24 0415 05/21/24 0434  NA 147* 147* 145 142 143 145  K 4.1 4.2 4.3 4.3 4.1 4.4  CL 103 102 101 100 99 101  CO2 39* 38* 37* 35* 36* 39*  ANIONGAP 5 7 7 7 7 6   GLUCOSE 230* 137* 141* 134* 137* 137*  BUN 35* 34* 34* 32* 32* 34*  CREATININE 1.53* 1.46* 1.60* 1.52* 1.46* 1.52*  AST  --  18 16 17 20 22   ALT  --  9 9 10 12 15   ALKPHOS  --  68 66 64 62 63  BILITOT  --  0.4 0.4 0.4 0.3 0.3  ALBUMIN   --  3.2* 3.3* 3.4* 3.3* 3.4*  INR  --  1.1  --   --   --   --   TSH 1.040  --   --   --   --   --   AMMONIA  --  31 33 35 38* 42*  MG  --  1.9 1.8 1.7 1.7  --   PHOS  --  2.5 3.2 3.0 3.5  --   CALCIUM  9.1 8.7* 8.8* 9.0 8.9 9.0      Recent Labs  Lab 05/16/24 1608 05/17/24 0436 05/18/24 0637 05/19/24 0458 05/20/24 0415 05/21/24 0434  INR  --  1.1  --   --   --   --   TSH 1.040  --   --   --   --   --   AMMONIA  --  31 33 35 38* 42*  MG  --  1.9 1.8 1.7 1.7  --   CALCIUM  9.1 8.7* 8.8* 9.0 8.9 9.0    --------------------------------------------------------------------------------------------------------------- Lab Results  Component Value Date   CHOL 140 03/14/2022   HDL 44 03/14/2022   LDLCALC 66 03/14/2022   TRIG 181 (H) 03/14/2022   CHOLHDL 3.2 03/14/2022    Lab Results  Component Value Date   HGBA1C 7.7 (H) 04/28/2023   No results for input(s): TSH, T4TOTAL, FREET4, T3FREE, THYROIDAB in the last 72 hours.  Radiology Reports  No results found.     Signature  -   Lavada Stank M.D  on 05/21/2024 at 10:25 AM   -  To page go to [email or web address]   "

## 2024-05-21 NOTE — Plan of Care (Signed)
